# Patient Record
Sex: Male | Born: 1945
Health system: Southern US, Community
[De-identification: ages and names within clinical notes are randomized; demographics above are authoritative.]

## PROBLEM LIST (undated history)

## (undated) DIAGNOSIS — I10 Essential (primary) hypertension: Secondary | ICD-10-CM

## (undated) DIAGNOSIS — Z860100 Personal history of colon polyps, unspecified: Secondary | ICD-10-CM

## (undated) DIAGNOSIS — K760 Fatty (change of) liver, not elsewhere classified: Secondary | ICD-10-CM

## (undated) DIAGNOSIS — M1990 Unspecified osteoarthritis, unspecified site: Secondary | ICD-10-CM

## (undated) DIAGNOSIS — D472 Monoclonal gammopathy: Secondary | ICD-10-CM

## (undated) DIAGNOSIS — E785 Hyperlipidemia, unspecified: Secondary | ICD-10-CM

## (undated) DIAGNOSIS — R748 Abnormal levels of other serum enzymes: Secondary | ICD-10-CM

## (undated) DIAGNOSIS — B1920 Unspecified viral hepatitis C without hepatic coma: Secondary | ICD-10-CM

## (undated) DIAGNOSIS — E119 Type 2 diabetes mellitus without complications: Secondary | ICD-10-CM

## (undated) DIAGNOSIS — R0600 Dyspnea, unspecified: Secondary | ICD-10-CM

## (undated) DIAGNOSIS — D509 Iron deficiency anemia, unspecified: Secondary | ICD-10-CM

## (undated) DIAGNOSIS — C220 Liver cell carcinoma: Secondary | ICD-10-CM

## (undated) HISTORY — PX: POLYPECTOMY: SHX149

## (undated) HISTORY — PX: COLONOSCOPY: SHX174

---

## 2000-01-29 ENCOUNTER — Encounter: Admission: RE | Admit: 2000-01-29 | Discharge: 2000-04-28 | Payer: Self-pay | Admitting: Family Medicine

## 2001-06-08 ENCOUNTER — Encounter: Payer: Self-pay | Admitting: Family Medicine

## 2001-06-08 ENCOUNTER — Ambulatory Visit (HOSPITAL_COMMUNITY): Admission: RE | Admit: 2001-06-08 | Discharge: 2001-06-08 | Payer: Self-pay | Admitting: Family Medicine

## 2004-12-23 ENCOUNTER — Ambulatory Visit: Payer: Self-pay | Admitting: Internal Medicine

## 2006-04-05 ENCOUNTER — Encounter (INDEPENDENT_AMBULATORY_CARE_PROVIDER_SITE_OTHER): Payer: Self-pay | Admitting: Specialist

## 2006-04-05 ENCOUNTER — Ambulatory Visit (HOSPITAL_COMMUNITY): Admission: RE | Admit: 2006-04-05 | Discharge: 2006-04-05 | Payer: Self-pay | Admitting: *Deleted

## 2008-04-13 ENCOUNTER — Encounter: Admission: RE | Admit: 2008-04-13 | Discharge: 2008-04-13 | Payer: Self-pay | Admitting: Internal Medicine

## 2008-12-24 ENCOUNTER — Ambulatory Visit (HOSPITAL_COMMUNITY): Admission: RE | Admit: 2008-12-24 | Discharge: 2008-12-24 | Payer: Self-pay | Admitting: *Deleted

## 2010-10-29 ENCOUNTER — Other Ambulatory Visit: Payer: Self-pay | Admitting: Internal Medicine

## 2010-10-29 DIAGNOSIS — R7989 Other specified abnormal findings of blood chemistry: Secondary | ICD-10-CM

## 2010-11-10 ENCOUNTER — Ambulatory Visit
Admission: RE | Admit: 2010-11-10 | Discharge: 2010-11-10 | Disposition: A | Discharge status: Home / Self Care | Payer: BC Managed Care – PPO | Source: Ambulatory Visit | Attending: Internal Medicine | Admitting: Internal Medicine

## 2010-11-10 DIAGNOSIS — R7989 Other specified abnormal findings of blood chemistry: Secondary | ICD-10-CM

## 2010-11-13 ENCOUNTER — Other Ambulatory Visit: Payer: Self-pay | Admitting: Internal Medicine

## 2010-11-13 DIAGNOSIS — K769 Liver disease, unspecified: Secondary | ICD-10-CM

## 2010-11-26 ENCOUNTER — Ambulatory Visit
Admission: RE | Admit: 2010-11-26 | Discharge: 2010-11-26 | Disposition: A | Discharge status: Home / Self Care | Payer: BC Managed Care – PPO | Source: Ambulatory Visit | Attending: Internal Medicine | Admitting: Internal Medicine

## 2010-11-26 DIAGNOSIS — K769 Liver disease, unspecified: Secondary | ICD-10-CM

## 2010-12-02 LAB — GLUCOSE, CAPILLARY: Glucose-Capillary: 126 mg/dL — ABNORMAL HIGH (ref 70–99)

## 2011-01-06 NOTE — Op Note (Signed)
NAME:  ESTEVEN, SOLLARS NO.:  [id number]   MEDICAL RECORD NO.:  [id number]          PATIENT TYPE:  AMB   LOCATION:  ENDO                         FACILITY:  [facility]   PHYSICIAN:  Waverly Ferrari, M.D.    DATE OF BIRTH:  [birth year]   DATE OF PROCEDURE:  DATE OF DISCHARGE:                               OPERATIVE REPORT   PROCEDURE:  Colonoscopy.   INDICATIONS:  Colon cancer screening.   ANESTHESIA:  Fentanyl 100 mcg, Versed 10 mg.   PROCEDURE:  With the patient mildly sedated in the left lateral  decubitus position a rectal examination was performed which was limited,  but unremarkable to my limited exam.  Subsequently, the Pentax  videoscopic pediatric colonoscope was inserted in the rectum and passed  under direct vision to the cecum identified by ileocecal valve and  appendiceal orifice, both of which were photographed.  From this point  the colonoscope was slowly withdrawn taking circumferential views of the  colonic mucosa after suctioning some liquid material from the cecum and  we withdrew slowly, taking circumferential views of the remaining  colonic mucosa stopping in the rectum which appeared normal on direct  and retroflexed view.  The endoscope was straightened and withdrawn.  The patient's vital signs and pulse oximeter remained stable.  The  patient tolerated the procedure well without apparent complications.   FINDINGS:  Negative examination.   PLAN:  Consider repeat examination in 5-10 years.           ______________________________  Waverly Ferrari, M.D.     GMO/MEDQ  D:  12/24/2008  T:  12/24/2008  Job:  [id number]

## 2011-01-09 NOTE — Op Note (Signed)
NAME:  Aaron [name], Aaron [name] NO.:  [id number]   MEDICAL RECORD NO.:  [id number]          PATIENT TYPE:  AMB   LOCATION:  ENDO                         FACILITY:  [facility]   PHYSICIAN:  Waverly Ferrari, M.D.    DATE OF BIRTH:  03-21-1946   DATE OF PROCEDURE:  04/05/2006  DATE OF DISCHARGE:                                 OPERATIVE REPORT   PROCEDURE:  Colonoscopy.   INDICATIONS:  Colon polyps.   ANESTHESIA:  Demerol 100 mg, Versed 10 mg.   DESCRIPTION OF PROCEDURE:  With the patient mildly sedated in the left  lateral decubitus position a rectal examination was performed which was  unremarkable.  Subsequently the Olympus videoscopic colonoscope was inserted  into the rectum; passed under direct vision into cecum, identified by  ileocecal valve, and appendiceal orifice both of which were photographed.  From this point the colonoscope was slowly withdrawn taking circumferential  views of the colonic mucosa, stopping at the descending colon area where 2  polyps were seen, photographed, and removed using snare cautery technique at  a setting of 20/200 of blended current.  Both were retained for pathology;  and placed in specimen container #1.   We next stopped in the descending colon at approximately 40 cm from anal  verge, at which point, another polyp was seen; and it too was removed using  snare cautery technique, again, with the setting was 20/200 blended current;  and it was retained by suctioning it through the endoscope into a trap.  Once accomplished, the endoscope was then further withdrawn taking  circumferential views of the remaining colonic mucosa, stopping in the  rectum, which appeared normal on direct and retroflex view.  The endoscope  was straightened and withdrawn.  The patient's vital signs and pulse  oximeter remained stable.  The patient tolerated procedure well without  apparent complications.   FINDINGS:  Polyps as described above descending colon and  at 40 cm from the  anal verge.  Await biopsy report.  The patient will call me for results and  follow up with me as an outpatient.           ______________________________  Waverly Ferrari, M.D.     GMO/MEDQ  D:  04/05/2006  T:  04/05/2006  Job:  [id number]

## 2011-06-30 ENCOUNTER — Other Ambulatory Visit: Payer: Self-pay | Admitting: Internal Medicine

## 2011-06-30 DIAGNOSIS — R7401 Elevation of levels of liver transaminase levels: Secondary | ICD-10-CM

## 2011-07-02 ENCOUNTER — Ambulatory Visit
Admission: RE | Admit: 2011-07-02 | Discharge: 2011-07-02 | Disposition: A | Discharge status: Home / Self Care | Payer: 59 | Source: Ambulatory Visit | Attending: Internal Medicine | Admitting: Internal Medicine

## 2011-07-02 DIAGNOSIS — R7401 Elevation of levels of liver transaminase levels: Secondary | ICD-10-CM

## 2011-11-19 DIAGNOSIS — Z0000 Encounter for general adult medical examination without abnormal findings: Secondary | ICD-10-CM | POA: Diagnosis not present

## 2011-11-19 DIAGNOSIS — B1920 Unspecified viral hepatitis C without hepatic coma: Secondary | ICD-10-CM | POA: Diagnosis not present

## 2011-11-19 DIAGNOSIS — I10 Essential (primary) hypertension: Secondary | ICD-10-CM | POA: Diagnosis not present

## 2011-11-19 DIAGNOSIS — E119 Type 2 diabetes mellitus without complications: Secondary | ICD-10-CM | POA: Diagnosis not present

## 2011-11-19 DIAGNOSIS — E7800 Pure hypercholesterolemia, unspecified: Secondary | ICD-10-CM | POA: Diagnosis not present

## 2012-02-17 DIAGNOSIS — E119 Type 2 diabetes mellitus without complications: Secondary | ICD-10-CM | POA: Diagnosis not present

## 2012-02-22 DIAGNOSIS — E119 Type 2 diabetes mellitus without complications: Secondary | ICD-10-CM | POA: Diagnosis not present

## 2012-02-22 DIAGNOSIS — E7800 Pure hypercholesterolemia, unspecified: Secondary | ICD-10-CM | POA: Diagnosis not present

## 2012-02-22 DIAGNOSIS — I10 Essential (primary) hypertension: Secondary | ICD-10-CM | POA: Diagnosis not present

## 2012-02-22 DIAGNOSIS — Z23 Encounter for immunization: Secondary | ICD-10-CM | POA: Diagnosis not present

## 2012-08-22 DIAGNOSIS — Z125 Encounter for screening for malignant neoplasm of prostate: Secondary | ICD-10-CM | POA: Diagnosis not present

## 2012-08-22 DIAGNOSIS — E7800 Pure hypercholesterolemia, unspecified: Secondary | ICD-10-CM | POA: Diagnosis not present

## 2012-08-22 DIAGNOSIS — I10 Essential (primary) hypertension: Secondary | ICD-10-CM | POA: Diagnosis not present

## 2012-08-22 DIAGNOSIS — E559 Vitamin D deficiency, unspecified: Secondary | ICD-10-CM | POA: Diagnosis not present

## 2012-08-22 DIAGNOSIS — E119 Type 2 diabetes mellitus without complications: Secondary | ICD-10-CM | POA: Diagnosis not present

## 2012-08-25 ENCOUNTER — Other Ambulatory Visit: Payer: Self-pay | Admitting: Internal Medicine

## 2012-08-25 DIAGNOSIS — E785 Hyperlipidemia, unspecified: Secondary | ICD-10-CM | POA: Diagnosis not present

## 2012-08-25 DIAGNOSIS — IMO0001 Reserved for inherently not codable concepts without codable children: Secondary | ICD-10-CM | POA: Diagnosis not present

## 2012-08-25 DIAGNOSIS — I10 Essential (primary) hypertension: Secondary | ICD-10-CM | POA: Diagnosis not present

## 2012-08-25 DIAGNOSIS — E7800 Pure hypercholesterolemia, unspecified: Secondary | ICD-10-CM | POA: Diagnosis not present

## 2012-08-25 DIAGNOSIS — K769 Liver disease, unspecified: Secondary | ICD-10-CM

## 2012-08-29 ENCOUNTER — Ambulatory Visit
Admission: RE | Admit: 2012-08-29 | Discharge: 2012-08-29 | Disposition: A | Discharge status: Home / Self Care | Payer: 59 | Source: Ambulatory Visit | Attending: Internal Medicine | Admitting: Internal Medicine

## 2012-08-29 DIAGNOSIS — K769 Liver disease, unspecified: Secondary | ICD-10-CM

## 2012-08-29 DIAGNOSIS — K7689 Other specified diseases of liver: Secondary | ICD-10-CM | POA: Diagnosis not present

## 2012-11-23 DIAGNOSIS — I10 Essential (primary) hypertension: Secondary | ICD-10-CM | POA: Diagnosis not present

## 2012-11-23 DIAGNOSIS — E119 Type 2 diabetes mellitus without complications: Secondary | ICD-10-CM | POA: Diagnosis not present

## 2012-11-23 DIAGNOSIS — R7309 Other abnormal glucose: Secondary | ICD-10-CM | POA: Diagnosis not present

## 2013-04-20 DIAGNOSIS — E119 Type 2 diabetes mellitus without complications: Secondary | ICD-10-CM | POA: Diagnosis not present

## 2013-04-27 DIAGNOSIS — G5600 Carpal tunnel syndrome, unspecified upper limb: Secondary | ICD-10-CM | POA: Diagnosis not present

## 2013-04-27 DIAGNOSIS — E7800 Pure hypercholesterolemia, unspecified: Secondary | ICD-10-CM | POA: Diagnosis not present

## 2013-04-27 DIAGNOSIS — I10 Essential (primary) hypertension: Secondary | ICD-10-CM | POA: Diagnosis not present

## 2013-04-27 DIAGNOSIS — E119 Type 2 diabetes mellitus without complications: Secondary | ICD-10-CM | POA: Diagnosis not present

## 2013-07-27 DIAGNOSIS — Z125 Encounter for screening for malignant neoplasm of prostate: Secondary | ICD-10-CM | POA: Diagnosis not present

## 2013-07-27 DIAGNOSIS — E119 Type 2 diabetes mellitus without complications: Secondary | ICD-10-CM | POA: Diagnosis not present

## 2013-07-27 DIAGNOSIS — I10 Essential (primary) hypertension: Secondary | ICD-10-CM | POA: Diagnosis not present

## 2013-11-23 DIAGNOSIS — Z125 Encounter for screening for malignant neoplasm of prostate: Secondary | ICD-10-CM | POA: Diagnosis not present

## 2013-11-23 DIAGNOSIS — I10 Essential (primary) hypertension: Secondary | ICD-10-CM | POA: Diagnosis not present

## 2013-11-23 DIAGNOSIS — E119 Type 2 diabetes mellitus without complications: Secondary | ICD-10-CM | POA: Diagnosis not present

## 2013-11-27 ENCOUNTER — Other Ambulatory Visit: Payer: Self-pay | Admitting: Internal Medicine

## 2013-11-27 DIAGNOSIS — R945 Abnormal results of liver function studies: Principal | ICD-10-CM

## 2013-11-27 DIAGNOSIS — R7989 Other specified abnormal findings of blood chemistry: Secondary | ICD-10-CM

## 2013-11-27 DIAGNOSIS — R109 Unspecified abdominal pain: Secondary | ICD-10-CM

## 2013-11-29 DIAGNOSIS — M5380 Other specified dorsopathies, site unspecified: Secondary | ICD-10-CM | POA: Diagnosis not present

## 2013-11-29 DIAGNOSIS — IMO0001 Reserved for inherently not codable concepts without codable children: Secondary | ICD-10-CM | POA: Diagnosis not present

## 2013-11-29 DIAGNOSIS — R7402 Elevation of levels of lactic acid dehydrogenase (LDH): Secondary | ICD-10-CM | POA: Diagnosis not present

## 2013-11-29 DIAGNOSIS — E7800 Pure hypercholesterolemia, unspecified: Secondary | ICD-10-CM | POA: Diagnosis not present

## 2013-11-30 ENCOUNTER — Other Ambulatory Visit: Payer: Self-pay | Admitting: Internal Medicine

## 2013-11-30 ENCOUNTER — Ambulatory Visit
Admission: RE | Admit: 2013-11-30 | Discharge: 2013-11-30 | Disposition: A | Discharge status: Home / Self Care | Payer: 59 | Source: Ambulatory Visit | Attending: Internal Medicine | Admitting: Internal Medicine

## 2013-11-30 DIAGNOSIS — R160 Hepatomegaly, not elsewhere classified: Secondary | ICD-10-CM

## 2013-11-30 DIAGNOSIS — R7989 Other specified abnormal findings of blood chemistry: Secondary | ICD-10-CM

## 2013-11-30 DIAGNOSIS — R109 Unspecified abdominal pain: Secondary | ICD-10-CM

## 2013-11-30 DIAGNOSIS — R945 Abnormal results of liver function studies: Principal | ICD-10-CM

## 2013-11-30 DIAGNOSIS — K7689 Other specified diseases of liver: Secondary | ICD-10-CM | POA: Diagnosis not present

## 2013-12-08 ENCOUNTER — Other Ambulatory Visit: Payer: Medicare Other

## 2013-12-11 ENCOUNTER — Ambulatory Visit
Admission: RE | Admit: 2013-12-11 | Discharge: 2013-12-11 | Disposition: A | Discharge status: Home / Self Care | Payer: 59 | Source: Ambulatory Visit | Attending: Internal Medicine | Admitting: Internal Medicine

## 2013-12-11 DIAGNOSIS — R160 Hepatomegaly, not elsewhere classified: Secondary | ICD-10-CM

## 2013-12-21 DIAGNOSIS — R7989 Other specified abnormal findings of blood chemistry: Secondary | ICD-10-CM | POA: Diagnosis not present

## 2013-12-21 DIAGNOSIS — B1920 Unspecified viral hepatitis C without hepatic coma: Secondary | ICD-10-CM | POA: Diagnosis not present

## 2013-12-25 ENCOUNTER — Other Ambulatory Visit: Payer: Self-pay | Admitting: Internal Medicine

## 2013-12-25 DIAGNOSIS — R160 Hepatomegaly, not elsewhere classified: Secondary | ICD-10-CM

## 2013-12-28 ENCOUNTER — Ambulatory Visit
Admission: RE | Admit: 2013-12-28 | Discharge: 2013-12-28 | Disposition: A | Discharge status: Home / Self Care | Payer: Medicare Other | Source: Ambulatory Visit | Attending: Internal Medicine | Admitting: Internal Medicine

## 2013-12-28 DIAGNOSIS — R109 Unspecified abdominal pain: Secondary | ICD-10-CM | POA: Diagnosis not present

## 2013-12-28 DIAGNOSIS — R160 Hepatomegaly, not elsewhere classified: Secondary | ICD-10-CM

## 2013-12-28 MED ORDER — IOHEXOL 350 MG/ML SOLN
125.0000 mL | Freq: Once | INTRAVENOUS | Status: AC | PRN
  Administered 2013-12-28: 125 mL via INTRAVENOUS

## 2014-01-04 DIAGNOSIS — I10 Essential (primary) hypertension: Secondary | ICD-10-CM | POA: Diagnosis not present

## 2014-01-04 DIAGNOSIS — K769 Liver disease, unspecified: Secondary | ICD-10-CM | POA: Diagnosis not present

## 2014-01-04 DIAGNOSIS — B1920 Unspecified viral hepatitis C without hepatic coma: Secondary | ICD-10-CM | POA: Diagnosis not present

## 2014-01-08 ENCOUNTER — Other Ambulatory Visit: Payer: Self-pay | Admitting: Internal Medicine

## 2014-01-08 DIAGNOSIS — R160 Hepatomegaly, not elsewhere classified: Secondary | ICD-10-CM

## 2014-01-18 ENCOUNTER — Ambulatory Visit
Admission: RE | Admit: 2014-01-18 | Discharge: 2014-01-18 | Disposition: A | Discharge status: Home / Self Care | Payer: Medicare Other | Source: Ambulatory Visit | Attending: Internal Medicine | Admitting: Internal Medicine

## 2014-01-18 DIAGNOSIS — R160 Hepatomegaly, not elsewhere classified: Secondary | ICD-10-CM

## 2014-01-18 DIAGNOSIS — K7689 Other specified diseases of liver: Secondary | ICD-10-CM | POA: Diagnosis not present

## 2014-01-18 MED ORDER — GADOBENATE DIMEGLUMINE 529 MG/ML IV SOLN
20.0000 mL | Freq: Once | INTRAVENOUS | Status: AC | PRN
  Administered 2014-01-18: 20 mL via INTRAVENOUS

## 2014-01-23 ENCOUNTER — Other Ambulatory Visit (HOSPITAL_COMMUNITY): Payer: Self-pay | Admitting: Internal Medicine

## 2014-01-23 DIAGNOSIS — R160 Hepatomegaly, not elsewhere classified: Secondary | ICD-10-CM

## 2014-01-26 ENCOUNTER — Encounter (HOSPITAL_COMMUNITY): Payer: Self-pay | Admitting: Pharmacy Technician

## 2014-01-26 ENCOUNTER — Other Ambulatory Visit: Payer: Self-pay | Admitting: Radiology

## 2014-01-30 ENCOUNTER — Ambulatory Visit (HOSPITAL_COMMUNITY)
Admission: RE | Admit: 2014-01-30 | Discharge: 2014-01-30 | Disposition: A | Discharge status: Home / Self Care | Payer: 59 | Source: Ambulatory Visit | Attending: Internal Medicine | Admitting: Internal Medicine

## 2014-01-30 ENCOUNTER — Encounter (HOSPITAL_COMMUNITY): Payer: Self-pay

## 2014-01-30 DIAGNOSIS — K7689 Other specified diseases of liver: Secondary | ICD-10-CM | POA: Insufficient documentation

## 2014-01-30 DIAGNOSIS — E785 Hyperlipidemia, unspecified: Secondary | ICD-10-CM | POA: Insufficient documentation

## 2014-01-30 DIAGNOSIS — C228 Malignant neoplasm of liver, primary, unspecified as to type: Secondary | ICD-10-CM | POA: Diagnosis not present

## 2014-01-30 DIAGNOSIS — C220 Liver cell carcinoma: Secondary | ICD-10-CM

## 2014-01-30 DIAGNOSIS — R160 Hepatomegaly, not elsewhere classified: Secondary | ICD-10-CM

## 2014-01-30 HISTORY — DX: Essential (primary) hypertension: I10

## 2014-01-30 HISTORY — DX: Liver cell carcinoma: C22.0

## 2014-01-30 HISTORY — DX: Hyperlipidemia, unspecified: E78.5

## 2014-01-30 HISTORY — DX: Type 2 diabetes mellitus without complications: E11.9

## 2014-01-30 LAB — CBC
HCT: 44.5 % (ref 39.0–52.0)
Hemoglobin: 14.3 g/dL (ref 13.0–17.0)
MCH: 23.3 pg — ABNORMAL LOW (ref 26.0–34.0)
MCHC: 32.1 g/dL (ref 30.0–36.0)
MCV: 72.5 fL — ABNORMAL LOW (ref 78.0–100.0)
Platelets: 202 10*3/uL (ref 150–400)
RBC: 6.14 MIL/uL — ABNORMAL HIGH (ref 4.22–5.81)
RDW: 15.5 % (ref 11.5–15.5)
WBC: 4.4 10*3/uL (ref 4.0–10.5)

## 2014-01-30 LAB — GLUCOSE, CAPILLARY: Glucose-Capillary: 105 mg/dL — ABNORMAL HIGH (ref 70–99)

## 2014-01-30 LAB — PROTIME-INR
INR: 1.01 (ref 0.00–1.49)
Prothrombin Time: 13.1 seconds (ref 11.6–15.2)

## 2014-01-30 LAB — APTT: aPTT: 30 seconds (ref 24–37)

## 2014-01-30 MED ORDER — OXYCODONE HCL 5 MG PO TABS: 5.0000 mg | ORAL_TABLET | ORAL | Status: DC | PRN

## 2014-01-30 MED ORDER — SODIUM CHLORIDE 0.9 % IV SOLN
INTRAVENOUS | Status: DC
  Administered 2014-01-30: 13:00:00 via INTRAVENOUS

## 2014-01-30 MED ORDER — MIDAZOLAM HCL 2 MG/2ML IJ SOLN
INTRAMUSCULAR | Status: AC | PRN
  Administered 2014-01-30 (×2): 1 mg via INTRAVENOUS

## 2014-01-30 MED ORDER — MIDAZOLAM HCL 2 MG/2ML IJ SOLN
INTRAMUSCULAR | Status: AC
  Filled 2014-01-30: qty 4

## 2014-01-30 MED ORDER — FENTANYL CITRATE 0.05 MG/ML IJ SOLN
INTRAMUSCULAR | Status: DC
Start: 2014-01-30 — End: 2014-01-31
  Filled 2014-01-30: qty 4

## 2014-01-30 MED ORDER — FENTANYL CITRATE 0.05 MG/ML IJ SOLN
INTRAMUSCULAR | Status: AC | PRN
  Administered 2014-01-30 (×2): 50 ug via INTRAVENOUS

## 2014-01-30 NOTE — Discharge Instructions (Signed)
Liver Biopsy  Care After  These instructions give you information on caring for yourself after your procedure. Your doctor may also give you more specific instructions. Call your doctor if you have any problems or questions after your procedure.  HOME CARE  · Watch for bleeding at your biopsy site.  · No heavy lifting, pushing, or pulling for 48 hours (2 days).  · No exercise, jogging, or sex for 48 hours (2 days).  · Do not drive or use heavy machinery for 24 hours (1 day).  · Go back to your usual diet and medicines as told by your doctor.  · Do not take the bandage off until the next morning.  · Only take medicine as told by your doctor.  · Do not shower or bathe until the next day.  GET HELP RIGHT AWAY IF:  · You have shortness of breath or trouble breathing.  · You have pain or cramping in your belly (abdomen).  · You feel sick to your stomach (nauseous) or throw up (vomit).  · Bleeding does not stop from the place where the needle was put in. Press on the place that is bleeding until you are checked in the Emergency Room.  · Yellowish white fluid (pus) is coming from the place where the needle was put in.  · You have any unusual pain that will not stop.  · You have puffiness (swelling) or redness at the place where the needle was put in, or if the place is very sore or hot when you touch it.  · You have a fever of more than 102° F (38.9° C) for 2 or more days.  · You have black, smelly poops (bowel movements).  If you go to the Emergency Room, tell the nurse that you had a liver biopsy. Take this paper with you and show it to the nurse. Keep your follow-up appointment.  MAKE SURE YOU:  · Understand these instructions.  · Will watch your condition.  · Will get help right away if you are not doing well or get worse.  Document Released: 05/19/2008 Document Revised: 11/02/2011 Document Reviewed: 05/19/2008  Exit[employer]® Patient Information ©2014 [employer].

## 2014-01-30 NOTE — Procedures (Signed)
US guided core biopsy of right hepatic lesion.  No immediate complication.

## 2014-01-30 NOTE — H&P (Signed)
Aaron [name] is an 68 y.o. male.   Chief Complaint: Pt has long history of diabetes and known Hep C "few years"- untreated Follows with PMD and has blood work checked periodically Recent elevation of liver function tests [place] 11/2013 revealed liver lesion Referred to Dr Minna Antis: CT 12/28/13 and MRI 01/18/14 all show liver lesion Now scheduled for liver lesion biopsy  HPI: HTN; DM; Hep C; HLD  Past Medical History  Diagnosis Date  . Hypertension   . Diabetes mellitus without complication   . Hepatitis   . Hyperlipidemia     History reviewed. No pertinent past surgical history.  History reviewed. No pertinent family history. Social History:  reports that he quit smoking about 20 years ago. He does not have any smokeless tobacco history on file. His alcohol and drug histories are not on file.  Allergies: No Known Allergies   (Not in a hospital admission)  Results for orders placed during the hospital encounter of 01/30/14 (from the past 48 hour(s))  GLUCOSE, CAPILLARY     Status: Abnormal   Collection Time    01/30/14  1:12 PM      Result Value Ref Range   Glucose-Capillary 105 (*) 70 - 99 mg/dL   Comment 1 Notify RN     Comment 2 Documented in Chart     No results found.  Review of Systems  Constitutional: Negative for fever and weight loss.  Eyes: Negative for blurred vision.  Respiratory: Negative for cough and shortness of breath.   Gastrointestinal: Negative for nausea, vomiting and abdominal pain.  Neurological: Negative for dizziness and weakness.  Psychiatric/Behavioral: Negative for substance abuse.    Blood pressure 156/80, temperature 98.8 F (37.1 C), temperature source Oral, resp. rate 18, height 6' (1.829 m), weight 120.203 kg (265 lb), SpO2 100.00%. Physical Exam  Constitutional: He is oriented to person, place, and time. He appears well-nourished.  Cardiovascular: Normal rate, regular rhythm and normal heart sounds.   No murmur heard. Respiratory: Effort  normal and breath sounds normal. He has no wheezes.  GI: Soft. Bowel sounds are normal. There is no tenderness.  Musculoskeletal: Normal range of motion.  Neurological: He is alert and oriented to person, place, and time.  Skin: Skin is warm and dry.  Psychiatric: He has a normal mood and affect. His behavior is normal. Judgment and thought content normal.     Assessment/Plan Hx Hep C; DM Elevated LFTs [place] abnormal; CT and MRI reveal liver lesion Now scheduled for liver lesion biopsy Pt aware of procedure benefits and risks and agreeable to proceed Consent signed and in chart  Aaron [name] 01/30/2014, 1:36 PM

## 2014-02-12 DIAGNOSIS — B182 Chronic viral hepatitis C: Secondary | ICD-10-CM | POA: Diagnosis not present

## 2014-02-12 DIAGNOSIS — C228 Malignant neoplasm of liver, primary, unspecified as to type: Secondary | ICD-10-CM | POA: Diagnosis not present

## 2014-02-12 DIAGNOSIS — K7460 Unspecified cirrhosis of liver: Secondary | ICD-10-CM | POA: Diagnosis not present

## 2014-02-13 ENCOUNTER — Other Ambulatory Visit: Payer: Self-pay | Admitting: Nurse Practitioner

## 2014-02-13 DIAGNOSIS — C228 Malignant neoplasm of liver, primary, unspecified as to type: Secondary | ICD-10-CM

## 2014-02-15 ENCOUNTER — Ambulatory Visit
Admission: RE | Admit: 2014-02-15 | Discharge: 2014-02-15 | Disposition: A | Discharge status: Home / Self Care | Payer: 59 | Source: Ambulatory Visit | Attending: Nurse Practitioner | Admitting: Nurse Practitioner

## 2014-02-15 DIAGNOSIS — C228 Malignant neoplasm of liver, primary, unspecified as to type: Secondary | ICD-10-CM

## 2014-02-15 HISTORY — DX: Liver cell carcinoma: C22.0

## 2014-02-20 ENCOUNTER — Other Ambulatory Visit: Payer: Self-pay | Admitting: Diagnostic Radiology

## 2014-02-20 DIAGNOSIS — C228 Malignant neoplasm of liver, primary, unspecified as to type: Secondary | ICD-10-CM

## 2014-02-28 ENCOUNTER — Encounter (HOSPITAL_COMMUNITY): Payer: Self-pay | Admitting: Pharmacy Technician

## 2014-03-01 ENCOUNTER — Other Ambulatory Visit: Payer: Self-pay | Admitting: Diagnostic Radiology

## 2014-03-01 DIAGNOSIS — C220 Liver cell carcinoma: Secondary | ICD-10-CM

## 2014-03-01 MED ORDER — DOXORUBICIN HCL 50 MG IV SOLR: 50.0000 mg | Freq: Once | INTRAVENOUS | Status: AC

## 2014-03-02 ENCOUNTER — Other Ambulatory Visit: Payer: Self-pay | Admitting: Diagnostic Radiology

## 2014-03-04 ENCOUNTER — Other Ambulatory Visit: Payer: Self-pay | Admitting: Radiology

## 2014-03-05 ENCOUNTER — Other Ambulatory Visit: Payer: Self-pay | Admitting: Radiology

## 2014-03-06 ENCOUNTER — Ambulatory Visit (HOSPITAL_COMMUNITY)
Admission: RE | Admit: 2014-03-06 | Discharge: 2014-03-06 | Disposition: A | Discharge status: Home / Self Care | Payer: 59 | Source: Ambulatory Visit | Attending: Diagnostic Radiology | Admitting: Diagnostic Radiology

## 2014-03-06 ENCOUNTER — Other Ambulatory Visit: Payer: Self-pay | Admitting: Diagnostic Radiology

## 2014-03-06 ENCOUNTER — Encounter (HOSPITAL_COMMUNITY): Payer: Self-pay

## 2014-03-06 VITALS — BP 162/83 | HR 61 | Temp 97.9°F | Resp 13

## 2014-03-06 DIAGNOSIS — C228 Malignant neoplasm of liver, primary, unspecified as to type: Secondary | ICD-10-CM | POA: Diagnosis not present

## 2014-03-06 DIAGNOSIS — B1920 Unspecified viral hepatitis C without hepatic coma: Secondary | ICD-10-CM | POA: Diagnosis not present

## 2014-03-06 DIAGNOSIS — E119 Type 2 diabetes mellitus without complications: Secondary | ICD-10-CM | POA: Insufficient documentation

## 2014-03-06 DIAGNOSIS — Z87891 Personal history of nicotine dependence: Secondary | ICD-10-CM | POA: Insufficient documentation

## 2014-03-06 DIAGNOSIS — E785 Hyperlipidemia, unspecified: Secondary | ICD-10-CM | POA: Diagnosis not present

## 2014-03-06 DIAGNOSIS — Z538 Procedure and treatment not carried out for other reasons: Secondary | ICD-10-CM | POA: Insufficient documentation

## 2014-03-06 DIAGNOSIS — I10 Essential (primary) hypertension: Secondary | ICD-10-CM | POA: Insufficient documentation

## 2014-03-06 LAB — COMPREHENSIVE METABOLIC PANEL
ALT: 55 U/L — ABNORMAL HIGH (ref 0–53)
AST: 46 U/L — ABNORMAL HIGH (ref 0–37)
Albumin: 4 g/dL (ref 3.5–5.2)
Alkaline Phosphatase: 44 U/L (ref 39–117)
Anion gap: 13 (ref 5–15)
BUN: 17 mg/dL (ref 6–23)
CO2: 25 mEq/L (ref 19–32)
Calcium: 9.7 mg/dL (ref 8.4–10.5)
Chloride: 101 mEq/L (ref 96–112)
Creatinine, Ser: 1.07 mg/dL (ref 0.50–1.35)
GFR calc Af Amer: 81 mL/min — ABNORMAL LOW (ref >90)
GFR calc non Af Amer: 70 mL/min — ABNORMAL LOW (ref >90)
Glucose, Bld: 124 mg/dL — ABNORMAL HIGH (ref 70–99)
Potassium: 4.3 mEq/L (ref 3.7–5.3)
Sodium: 139 mEq/L (ref 137–147)
Total Bilirubin: 0.5 mg/dL (ref 0.3–1.2)
Total Protein: 8.2 g/dL (ref 6.0–8.3)

## 2014-03-06 LAB — CBC WITH DIFFERENTIAL/PLATELET
Basophils Absolute: 0 10*3/uL (ref 0.0–0.1)
Basophils Relative: 0 % (ref 0–1)
Eosinophils Absolute: 0.2 10*3/uL (ref 0.0–0.7)
Eosinophils Relative: 3 % (ref 0–5)
HCT: 42.2 % (ref 39.0–52.0)
Hemoglobin: 13.5 g/dL (ref 13.0–17.0)
Lymphocytes Relative: 37 % (ref 12–46)
Lymphs Abs: 2.1 10*3/uL (ref 0.7–4.0)
MCH: 22.6 pg — ABNORMAL LOW (ref 26.0–34.0)
MCHC: 32 g/dL (ref 30.0–36.0)
MCV: 70.7 fL — ABNORMAL LOW (ref 78.0–100.0)
Monocytes Absolute: 0.5 10*3/uL (ref 0.1–1.0)
Monocytes Relative: 9 % (ref 3–12)
Neutro Abs: 2.9 10*3/uL (ref 1.7–7.7)
Neutrophils Relative %: 51 % (ref 43–77)
Platelets: 212 10*3/uL (ref 150–400)
RBC: 5.97 MIL/uL — ABNORMAL HIGH (ref 4.22–5.81)
RDW: 14.8 % (ref 11.5–15.5)
WBC: 5.7 10*3/uL (ref 4.0–10.5)

## 2014-03-06 LAB — PROTIME-INR
INR: 0.96 (ref 0.00–1.49)
Prothrombin Time: 12.8 seconds (ref 11.6–15.2)

## 2014-03-06 LAB — GLUCOSE, CAPILLARY: Glucose-Capillary: 124 mg/dL — ABNORMAL HIGH (ref 70–99)

## 2014-03-06 LAB — APTT: aPTT: 30 seconds (ref 24–37)

## 2014-03-06 MED ORDER — FENTANYL CITRATE 0.05 MG/ML IJ SOLN
INTRAMUSCULAR | Status: AC
  Filled 2014-03-06: qty 8

## 2014-03-06 MED ORDER — SODIUM CHLORIDE 0.9 % IV SOLN
INTRAVENOUS | Status: DC
  Administered 2014-03-06: 08:00:00 via INTRAVENOUS

## 2014-03-06 MED ORDER — ONDANSETRON HCL 4 MG/2ML IJ SOLN
INTRAMUSCULAR | Status: AC
  Filled 2014-03-06: qty 2

## 2014-03-06 MED ORDER — DEXAMETHASONE SODIUM PHOSPHATE 10 MG/ML IJ SOLN
INTRAMUSCULAR | Status: AC
  Filled 2014-03-06: qty 1

## 2014-03-06 MED ORDER — PIPERACILLIN-TAZOBACTAM 3.375 G IVPB
3.3750 g | Freq: Once | INTRAVENOUS | Status: AC
  Administered 2014-03-06: 3.375 g via INTRAVENOUS
  Filled 2014-03-06: qty 50

## 2014-03-06 MED ORDER — DEXAMETHASONE SODIUM PHOSPHATE 10 MG/ML IJ SOLN
10.0000 mg | Freq: Once | INTRAMUSCULAR | Status: AC
  Administered 2014-03-06: 10 mg via INTRAVENOUS

## 2014-03-06 MED ORDER — DOXORUBICIN HCL 50 MG IV SOLR
Freq: Once | INTRAVENOUS | Status: DC
Start: 2014-03-06 — End: 2014-03-07
  Filled 2014-03-06: qty 8.5

## 2014-03-06 MED ORDER — ONDANSETRON HCL 4 MG/2ML IJ SOLN
4.0000 mg | Freq: Once | INTRAMUSCULAR | Status: AC
  Administered 2014-03-06: 4 mg via INTRAVENOUS

## 2014-03-06 MED ORDER — MIDAZOLAM HCL 2 MG/2ML IJ SOLN
INTRAMUSCULAR | Status: AC
  Filled 2014-03-06: qty 8

## 2014-03-06 NOTE — Progress Notes (Signed)
Returned to [facility] from [facility]. Did not receive sedation. IV removed. Patient discharged ambulatory.

## 2014-03-06 NOTE — H&P (Signed)
Aaron [name] is an 68 y.o. male.   Chief Complaint: Pt with Hx Hep C for years- untreated Noted elevated liver functions in 11/2013 US revealed liver lesion Bx 01/2014 +hepatocellualr cancer Pt was consulted with Dr Anselm Pancoast regarding treatment for same Pt not surgical candidate secondary size and location of liver tumor Discussed with pt was Transarterial chemoembolization (TACE) and microwave ablation Was determined TACE was to be performed initially then re evaluation Possible microwave ablation at later date.  HPI: HTN; [place]; DM; Hep C; HLD  Past Medical History  Diagnosis Date  . Hypertension   . Diabetes mellitus without complication   . Hepatitis   . Hyperlipidemia   . Hepatocellular carcinoma 01/30/2014    Path  . Hyperlipidemia     History reviewed. No pertinent past surgical history.  History reviewed. No pertinent family history. Social History:  reports that he quit smoking about 20 years ago. He has never used smokeless tobacco. He reports that he does not use illicit drugs. His alcohol history is not on file.  Allergies: No Known Allergies   (Not in a hospital admission)  Results for orders placed during the hospital encounter of 03/06/14 (from the past 48 hour(s))  APTT     Status: None   Collection Time    03/06/14  8:00 AM      Result Value Ref Range   aPTT 30  24 - 37 seconds  CBC WITH DIFFERENTIAL     Status: Abnormal (Preliminary result)   Collection Time    03/06/14  8:00 AM      Result Value Ref Range   WBC 5.7  4.0 - 10.5 K/uL   RBC 5.97 (*) 4.22 - 5.81 MIL/uL   Hemoglobin 13.5  13.0 - 17.0 g/dL   HCT 42.2  39.0 - 52.0 %   MCV 70.7 (*) 78.0 - 100.0 fL   MCH 22.6 (*) 26.0 - 34.0 pg   MCHC 32.0  30.0 - 36.0 g/dL   RDW 14.8  11.5 - 15.5 %   Platelets 212  150 - 400 K/uL   Neutrophils Relative % PENDING  43 - 77 %   Neutro Abs PENDING  1.7 - 7.7 K/uL   Band Neutrophils PENDING  0 - 10 %   Lymphocytes Relative PENDING  12 - 46 %   Lymphs Abs PENDING   0.7 - 4.0 K/uL   Monocytes Relative PENDING  3 - 12 %   Monocytes Absolute PENDING  0.1 - 1.0 K/uL   Eosinophils Relative PENDING  0 - 5 %   Eosinophils Absolute PENDING  0.0 - 0.7 K/uL   Basophils Relative PENDING  0 - 1 %   Basophils Absolute PENDING  0.0 - 0.1 K/uL   WBC Morphology PENDING     RBC Morphology PENDING     Smear Review PENDING     nRBC PENDING  0 /100 WBC   Metamyelocytes Relative PENDING     Myelocytes PENDING     Promyelocytes Absolute PENDING     Blasts PENDING    COMPREHENSIVE METABOLIC PANEL     Status: Abnormal   Collection Time    03/06/14  8:00 AM      Result Value Ref Range   Sodium 139  137 - 147 mEq/L   Potassium 4.3  3.7 - 5.3 mEq/L   Chloride 101  96 - 112 mEq/L   CO2 25  19 - 32 mEq/L   Glucose, Bld 124 (*) 70 - 99 mg/dL  BUN 17  6 - 23 mg/dL   Creatinine, Ser 1.07  0.50 - 1.35 mg/dL   Calcium 9.7  8.4 - 10.5 mg/dL   Total Protein 8.2  6.0 - 8.3 g/dL   Albumin 4.0  3.5 - 5.2 g/dL   AST 46 (*) 0 - 37 U/L   ALT 55 (*) 0 - 53 U/L   Alkaline Phosphatase 44  39 - 117 U/L   Total Bilirubin 0.5  0.3 - 1.2 mg/dL   GFR calc non Af Amer 70 (*) >90 mL/min   GFR calc Af Amer 81 (*) >90 mL/min   Comment: (NOTE)     The eGFR has been calculated using the CKD EPI equation.     This calculation has not been validated in all clinical situations.     eGFR's persistently <90 mL/min signify possible Chronic Kidney     Disease.   Anion gap 13  5 - 15  PROTIME-INR     Status: None   Collection Time    03/06/14  8:00 AM      Result Value Ref Range   Prothrombin Time 12.8  11.6 - 15.2 seconds   INR 0.96  0.00 - 1.49   No results found.  Review of Systems  Constitutional: Negative for fever and weight loss.  Respiratory: Negative for shortness of breath.   Cardiovascular: Negative for chest pain.  Gastrointestinal: Positive for abdominal pain. Negative for nausea and vomiting.  Musculoskeletal: Positive for back pain.  Neurological: Negative for weakness  and headaches.  Psychiatric/Behavioral: Negative for substance abuse.    Blood pressure 174/78, pulse 64, temperature 97.9 F (36.6 C), temperature source Oral, resp. rate 18, SpO2 99.00%. Physical Exam  Constitutional: He is oriented to person, place, and time. He appears well-nourished.  Cardiovascular: Normal rate, regular rhythm and normal heart sounds.   No murmur heard. Respiratory: Effort normal and breath sounds normal. He has no wheezes.  GI: Soft. Bowel sounds are normal. There is no tenderness.  Musculoskeletal: Normal range of motion.  Neurological: He is alert and oriented to person, place, and time.  Skin: Skin is warm and dry.  Psychiatric: He has a normal mood and affect. His behavior is normal. Judgment and thought content normal.     Assessment/Plan +HCC Liver lesion Scheduled for transarterial chemoembolization today in IR Pt aware of procedure benefits and risks and agreeable to proceed Consent signed and in chart Pt understand he will likely be admitted overnight after procedure Plan for dc in am  TURPIN,PAMELA A 03/06/2014, 8:51 AM  The procedure was re-scheduled because the chemotherapy agents were not available.  Plan for chemoembolization on 03/13/14.

## 2014-03-06 NOTE — Sedation Documentation (Signed)
Chemo drug needed for procedure not available from pharmacy. Case cancelled. Rescheduled. MD at bedside.

## 2014-03-07 ENCOUNTER — Other Ambulatory Visit: Payer: Self-pay | Admitting: Diagnostic Radiology

## 2014-03-07 MED ORDER — DOXORUBICIN HCL 50 MG IV SOLR
75.0000 mg | Freq: Once | INTRAVENOUS | Status: AC
  Administered 2014-03-13: 75 mg via INTRA_ARTERIAL
  Filled 2014-03-07: qty 75

## 2014-03-08 ENCOUNTER — Other Ambulatory Visit: Payer: Self-pay | Admitting: Radiology

## 2014-03-13 ENCOUNTER — Other Ambulatory Visit: Payer: Self-pay | Admitting: Diagnostic Radiology

## 2014-03-13 ENCOUNTER — Ambulatory Visit (HOSPITAL_COMMUNITY)
Admission: RE | Admit: 2014-03-13 | Discharge: 2014-03-13 | Disposition: A | Discharge status: Home / Self Care | Payer: 59 | Source: Ambulatory Visit | Attending: Diagnostic Radiology | Admitting: Diagnostic Radiology

## 2014-03-13 ENCOUNTER — Observation Stay (HOSPITAL_COMMUNITY)
Admission: RE | Admit: 2014-03-13 | Discharge: 2014-03-14 | Disposition: A | Discharge status: Home / Self Care | Payer: 59 | Source: Ambulatory Visit | Attending: Diagnostic Radiology | Admitting: Diagnostic Radiology

## 2014-03-13 ENCOUNTER — Encounter (HOSPITAL_COMMUNITY): Payer: Self-pay

## 2014-03-13 VITALS — BP 139/72 | HR 70 | Temp 97.8°F | Resp 16 | Ht 72.0 in | Wt 261.2 lb

## 2014-03-13 DIAGNOSIS — C228 Malignant neoplasm of liver, primary, unspecified as to type: Secondary | ICD-10-CM

## 2014-03-13 DIAGNOSIS — Z8619 Personal history of other infectious and parasitic diseases: Secondary | ICD-10-CM | POA: Diagnosis not present

## 2014-03-13 DIAGNOSIS — E785 Hyperlipidemia, unspecified: Secondary | ICD-10-CM | POA: Insufficient documentation

## 2014-03-13 DIAGNOSIS — I10 Essential (primary) hypertension: Secondary | ICD-10-CM | POA: Diagnosis not present

## 2014-03-13 DIAGNOSIS — E119 Type 2 diabetes mellitus without complications: Secondary | ICD-10-CM | POA: Diagnosis not present

## 2014-03-13 DIAGNOSIS — C229 Malignant neoplasm of liver, not specified as primary or secondary: Principal | ICD-10-CM | POA: Insufficient documentation

## 2014-03-13 DIAGNOSIS — K759 Inflammatory liver disease, unspecified: Secondary | ICD-10-CM | POA: Insufficient documentation

## 2014-03-13 DIAGNOSIS — Z87891 Personal history of nicotine dependence: Secondary | ICD-10-CM | POA: Insufficient documentation

## 2014-03-13 DIAGNOSIS — C220 Liver cell carcinoma: Secondary | ICD-10-CM | POA: Diagnosis present

## 2014-03-13 LAB — CBC WITH DIFFERENTIAL/PLATELET
Basophils Absolute: 0.1 10*3/uL (ref 0.0–0.1)
Basophils Relative: 1 % (ref 0–1)
Eosinophils Absolute: 0.2 10*3/uL (ref 0.0–0.7)
Eosinophils Relative: 3 % (ref 0–5)
HCT: 40.5 % (ref 39.0–52.0)
Hemoglobin: 13.1 g/dL (ref 13.0–17.0)
Lymphocytes Relative: 32 % (ref 12–46)
Lymphs Abs: 1.9 10*3/uL (ref 0.7–4.0)
MCH: 22.7 pg — ABNORMAL LOW (ref 26.0–34.0)
MCHC: 32.3 g/dL (ref 30.0–36.0)
MCV: 70.1 fL — ABNORMAL LOW (ref 78.0–100.0)
Monocytes Absolute: 0.5 10*3/uL (ref 0.1–1.0)
Monocytes Relative: 9 % (ref 3–12)
Neutro Abs: 3.2 10*3/uL (ref 1.7–7.7)
Neutrophils Relative %: 55 % (ref 43–77)
Platelets: 229 10*3/uL (ref 150–400)
RBC: 5.78 MIL/uL (ref 4.22–5.81)
RDW: 14.8 % (ref 11.5–15.5)
WBC: 5.9 10*3/uL (ref 4.0–10.5)

## 2014-03-13 LAB — COMPREHENSIVE METABOLIC PANEL
ALT: 42 U/L (ref 0–53)
AST: 34 U/L (ref 0–37)
Albumin: 3.7 g/dL (ref 3.5–5.2)
Alkaline Phosphatase: 45 U/L (ref 39–117)
Anion gap: 12 (ref 5–15)
BUN: 16 mg/dL (ref 6–23)
CO2: 26 mEq/L (ref 19–32)
Calcium: 9.6 mg/dL (ref 8.4–10.5)
Chloride: 99 mEq/L (ref 96–112)
Creatinine, Ser: 1.12 mg/dL (ref 0.50–1.35)
GFR calc Af Amer: 77 mL/min — ABNORMAL LOW (ref >90)
GFR calc non Af Amer: 66 mL/min — ABNORMAL LOW (ref >90)
Glucose, Bld: 124 mg/dL — ABNORMAL HIGH (ref 70–99)
Potassium: 4.3 mEq/L (ref 3.7–5.3)
Sodium: 137 mEq/L (ref 137–147)
Total Bilirubin: 0.5 mg/dL (ref 0.3–1.2)
Total Protein: 7.6 g/dL (ref 6.0–8.3)

## 2014-03-13 LAB — PROTIME-INR
INR: 0.91 (ref 0.00–1.49)
Prothrombin Time: 12.3 seconds (ref 11.6–15.2)

## 2014-03-13 LAB — GLUCOSE, CAPILLARY: Glucose-Capillary: 110 mg/dL — ABNORMAL HIGH (ref 70–99)

## 2014-03-13 MED ORDER — DEXAMETHASONE SODIUM PHOSPHATE 10 MG/ML IJ SOLN
8.0000 mg | Freq: Once | INTRAMUSCULAR | Status: AC
  Administered 2014-03-13: 8 mg via INTRAVENOUS
  Filled 2014-03-13: qty 1

## 2014-03-13 MED ORDER — PIPERACILLIN-TAZOBACTAM 3.375 G IVPB
3.3750 g | Freq: Once | INTRAVENOUS | Status: AC
  Administered 2014-03-13: 3.375 g via INTRAVENOUS
  Filled 2014-03-13: qty 50

## 2014-03-13 MED ORDER — PROMETHAZINE HCL 25 MG PO TABS
25.0000 mg | ORAL_TABLET | Freq: Three times a day (TID) | ORAL | Status: DC | PRN
Start: 2014-03-13 — End: 2014-03-14

## 2014-03-13 MED ORDER — LISINOPRIL 20 MG PO TABS
20.0000 mg | ORAL_TABLET | Freq: Every morning | ORAL | Status: DC
  Administered 2014-03-14: 20 mg via ORAL
  Filled 2014-03-13: qty 1

## 2014-03-13 MED ORDER — ASPIRIN EC 81 MG PO TBEC
81.0000 mg | DELAYED_RELEASE_TABLET | Freq: Every day | ORAL | Status: DC
Start: 2014-03-13 — End: 2014-03-14
  Administered 2014-03-13 – 2014-03-14 (×2): 81 mg via ORAL
  Filled 2014-03-13 (×2): qty 1

## 2014-03-13 MED ORDER — OXYCODONE HCL 5 MG PO TABS: 5.0000 mg | ORAL_TABLET | ORAL | Status: DC | PRN

## 2014-03-13 MED ORDER — FENTANYL CITRATE 0.05 MG/ML IJ SOLN
INTRAMUSCULAR | Status: AC
  Filled 2014-03-13: qty 6

## 2014-03-13 MED ORDER — MIDAZOLAM HCL 2 MG/2ML IJ SOLN
INTRAMUSCULAR | Status: AC | PRN
  Administered 2014-03-13: 0.5 mg via INTRAVENOUS
  Administered 2014-03-13: 1 mg via INTRAVENOUS
  Administered 2014-03-13 (×3): 0.5 mg via INTRAVENOUS
  Administered 2014-03-13: 1 mg via INTRAVENOUS
  Administered 2014-03-13 (×2): 0.5 mg via INTRAVENOUS

## 2014-03-13 MED ORDER — SODIUM CHLORIDE 0.9 % IJ SOLN: 3.0000 mL | INTRAMUSCULAR | Status: DC | PRN

## 2014-03-13 MED ORDER — MIDAZOLAM HCL 2 MG/2ML IJ SOLN
INTRAMUSCULAR | Status: AC
  Filled 2014-03-13: qty 6

## 2014-03-13 MED ORDER — METFORMIN HCL 500 MG PO TABS: 1000.0000 mg | ORAL_TABLET | Freq: Every day | ORAL | Status: DC

## 2014-03-13 MED ORDER — LINAGLIPTIN 5 MG PO TABS: 5.0000 mg | ORAL_TABLET | Freq: Every day | ORAL | Status: DC

## 2014-03-13 MED ORDER — SIMVASTATIN 40 MG PO TABS
40.0000 mg | ORAL_TABLET | Freq: Every day | ORAL | Status: DC
  Administered 2014-03-13 – 2014-03-14 (×2): 40 mg via ORAL
  Filled 2014-03-13 (×2): qty 1

## 2014-03-13 MED ORDER — SODIUM CHLORIDE 0.9 % IJ SOLN: 3.0000 mL | Freq: Two times a day (BID) | INTRAMUSCULAR | Status: DC

## 2014-03-13 MED ORDER — IOHEXOL 300 MG/ML  SOLN: 80.0000 mL | Freq: Once | INTRAMUSCULAR | Status: AC | PRN

## 2014-03-13 MED ORDER — INSULIN GLARGINE 100 UNIT/ML ~~LOC~~ SOLN
31.0000 [IU] | Freq: Every morning | SUBCUTANEOUS | Status: DC
  Administered 2014-03-14: 31 [IU] via SUBCUTANEOUS
  Filled 2014-03-13: qty 0.31

## 2014-03-13 MED ORDER — SODIUM CHLORIDE 0.9 % IV SOLN
INTRAVENOUS | Status: DC
  Administered 2014-03-13 (×2): via INTRAVENOUS

## 2014-03-13 MED ORDER — LIDOCAINE HCL 1 % IJ SOLN
INTRAMUSCULAR | Status: AC
  Filled 2014-03-13: qty 20

## 2014-03-13 MED ORDER — PROMETHAZINE HCL 25 MG RE SUPP: 25.0000 mg | Freq: Three times a day (TID) | RECTAL | Status: DC | PRN

## 2014-03-13 MED ORDER — ONDANSETRON HCL 4 MG/2ML IJ SOLN
4.0000 mg | Freq: Four times a day (QID) | INTRAMUSCULAR | Status: DC | PRN
  Administered 2014-03-13: 4 mg via INTRAVENOUS
  Filled 2014-03-13: qty 2

## 2014-03-13 MED ORDER — IOHEXOL 300 MG/ML  SOLN
INTRAMUSCULAR | Status: AC | PRN
  Administered 2014-03-13: 1 mL

## 2014-03-13 MED ORDER — FENTANYL CITRATE 0.05 MG/ML IJ SOLN
INTRAMUSCULAR | Status: AC | PRN
  Administered 2014-03-13: 50 ug via INTRAVENOUS
  Administered 2014-03-13 (×2): 25 ug via INTRAVENOUS

## 2014-03-13 MED ORDER — SODIUM CHLORIDE 0.9 % IV SOLN: 250.0000 mL | INTRAVENOUS | Status: DC | PRN

## 2014-03-13 MED ORDER — VITAMIN D3 25 MCG (1000 UNIT) PO TABS
1000.0000 [IU] | ORAL_TABLET | Freq: Every day | ORAL | Status: DC
  Administered 2014-03-13 – 2014-03-14 (×2): 1000 [IU] via ORAL
  Filled 2014-03-13 (×2): qty 1

## 2014-03-13 NOTE — Sedation Documentation (Signed)
5Fr Sheath removed from R femoral artery by Dr. Anselm Pancoast.  Hemostasis achieved using Exoseal device.  Groin level 0, 3+RDP.

## 2014-03-13 NOTE — Sedation Documentation (Signed)
Gauze/tegaderm dressing applied to R fem art puncture.  CDI, Level 0, 3+R DP.

## 2014-03-13 NOTE — H&P (Signed)
Aaron [name] is an 68 y.o. male.   Chief Complaint: liver cancer HPI: Patient with history of hepatitis C and recently diagnosed [place] (right hepatic lobe) presents today for hepatic arteriography/chemoembolization (TACE).  Past Medical History  Diagnosis Date  . Hypertension   . Diabetes mellitus without complication   . Hepatitis   . Hyperlipidemia   . Hepatocellular carcinoma 01/30/2014    Path  . Hyperlipidemia     History reviewed. No pertinent past surgical history.  History reviewed. No pertinent family history. Social History:  reports that he quit smoking about 20 years ago. He has never used smokeless tobacco. He reports that he does not use illicit drugs. His alcohol history is not on file.  Allergies: No Known Allergies  Current outpatient prescriptions:aspirin EC 81 MG tablet, Take 81 mg by mouth daily., Disp: , Rfl: ;  cholecalciferol (VITAMIN D) 1000 UNITS tablet, Take 1,000 Units by mouth daily., Disp: , Rfl: ;  Insulin Glargine (LANTUS SOLOSTAR) 100 UNIT/ML Solostar Pen, Inject 31 Units into the skin every morning. , Disp: , Rfl: ;  lisinopril (PRINIVIL,ZESTRIL) 20 MG tablet, Take 20 mg by mouth every morning. , Disp: , Rfl:  Multiple Vitamins-Minerals (MULTIVITAMIN WITH MINERALS) tablet, Take 1 tablet by mouth daily., Disp: , Rfl: ;  simvastatin (ZOCOR) 40 MG tablet, Take 40 mg by mouth daily., Disp: , Rfl: ;  sitaGLIPtin-metformin (JANUMET) 50-1000 MG per tablet, Take 1 tablet by mouth daily., Disp: , Rfl:  Current facility-administered medications:0.9 %  sodium chloride infusion, , Intravenous, Continuous, Hedy Jacob, PA-C, Last Rate: 75 mL/hr at 03/13/14 0815;  dexamethasone (DECADRON) injection 8 mg, 8 mg, Intravenous, Once, Koreen D Morgan, PA-C;  DOXOrubicin (ADRIAMYCIN) chemo injection 75 mg, 75 mg, Intra-arterial, Once, Carylon Perches, MD;  ondansetron [facility]) injection 4 mg, 4 mg, Intravenous, Q6H PRN, Hedy Jacob, PA-C piperacillin-tazobactam (ZOSYN) IVPB  3.375 g, 3.375 g, Intravenous, Once, Hedy Jacob, PA-C   Results for orders placed during the hospital encounter of 03/13/14 (from the past 48 hour(s))  CBC WITH DIFFERENTIAL     Status: Abnormal   Collection Time    03/13/14  8:00 AM      Result Value Ref Range   WBC 5.9  4.0 - 10.5 K/uL   RBC 5.78  4.22 - 5.81 MIL/uL   Hemoglobin 13.1  13.0 - 17.0 g/dL   HCT 40.5  39.0 - 52.0 %   MCV 70.1 (*) 78.0 - 100.0 fL   MCH 22.7 (*) 26.0 - 34.0 pg   MCHC 32.3  30.0 - 36.0 g/dL   RDW 14.8  11.5 - 15.5 %   Platelets 229  150 - 400 K/uL   Neutrophils Relative % 55  43 - 77 %   Lymphocytes Relative 32  12 - 46 %   Monocytes Relative 9  3 - 12 %   Eosinophils Relative 3  0 - 5 %   Basophils Relative 1  0 - 1 %   Neutro Abs 3.2  1.7 - 7.7 K/uL   Lymphs Abs 1.9  0.7 - 4.0 K/uL   Monocytes Absolute 0.5  0.1 - 1.0 K/uL   Eosinophils Absolute 0.2  0.0 - 0.7 K/uL   Basophils Absolute 0.1  0.0 - 0.1 K/uL   Smear Review MORPHOLOGY UNREMARKABLE    COMPREHENSIVE METABOLIC PANEL     Status: Abnormal   Collection Time    03/13/14  8:00 AM      Result Value Ref Range  Sodium 137  137 - 147 mEq/L   Potassium 4.3  3.7 - 5.3 mEq/L   Chloride 99  96 - 112 mEq/L   CO2 26  19 - 32 mEq/L   Glucose, Bld 124 (*) 70 - 99 mg/dL   BUN 16  6 - 23 mg/dL   Creatinine, Ser 1.12  0.50 - 1.35 mg/dL   Calcium 9.6  8.4 - 10.5 mg/dL   Total Protein 7.6  6.0 - 8.3 g/dL   Albumin 3.7  3.5 - 5.2 g/dL   AST 34  0 - 37 U/L   ALT 42  0 - 53 U/L   Alkaline Phosphatase 45  39 - 117 U/L   Total Bilirubin 0.5  0.3 - 1.2 mg/dL   GFR calc non Af Amer 66 (*) >90 mL/min   GFR calc Af Amer 77 (*) >90 mL/min   Comment: (NOTE)     The eGFR has been calculated using the CKD EPI equation.     This calculation has not been validated in all clinical situations.     eGFR's persistently <90 mL/min signify possible Chronic Kidney     Disease.   Anion gap 12  5 - 15  PROTIME-INR     Status: None   Collection Time    03/13/14   8:00 AM      Result Value Ref Range   Prothrombin Time 12.3  11.6 - 15.2 seconds   INR 0.91  0.00 - 1.49   No results found.  Review of Systems  Constitutional: Negative for fever and chills.  Respiratory: Negative for hemoptysis and shortness of breath.        Occ dry cough  Cardiovascular: Negative for chest pain.  Gastrointestinal: Negative for nausea, vomiting and abdominal pain.  Genitourinary: Negative for dysuria and hematuria.  Musculoskeletal: Positive for back pain.  Neurological: Negative for headaches.  Endo/Heme/Allergies: Does not bruise/bleed easily.    Blood pressure 144/83, pulse 67, temperature 98.7 F (37.1 C), temperature source Oral, resp. rate 18, height 6' (1.829 m), weight 268 lb (121.564 kg), SpO2 96.00%. Physical Exam  Constitutional: He is oriented to person, place, and time. He appears well-developed and well-nourished.  Cardiovascular: Normal rate and regular rhythm.   Respiratory: Effort normal and breath sounds normal.  GI: Soft. Bowel sounds are normal. There is no tenderness.  obese  Musculoskeletal: Normal range of motion.  Trace bilat LE edema  Neurological: He is alert and oriented to person, place, and time.     Assessment/Plan Patient with history of hepatitis C and recently diagnosed [place] (right hepatic lobe) presents today for hepatic arteriography/chemoembolization (TACE). Details/risks of procedure d/w pt/wife with their understanding and consent.  Keliyah Spillman,D [facility] 03/13/2014, 8:43 AM

## 2014-03-13 NOTE — Progress Notes (Signed)
Day of Surgery  Subjective: Pt without new c/o; denies abd pain,N/V; currently eating  Objective: Vital signs in last 24 hours: Temp:  [97.8 F (36.6 C)-98.7 F (37.1 C)] 98.1 F (36.7 C) (07/21 1550) Pulse Rate:  [53-89] 89 (07/21 1550) Resp:  [11-20] 18 (07/21 1550) BP: (109-151)/(62-83) 131/78 mmHg (07/21 1550) SpO2:  [13 %-100 %] 99 % (07/21 1550) Weight:  [261 lb 3.9 oz (118.5 kg)-268 lb (121.564 kg)] 261 lb 3.9 oz (118.5 kg) (07/21 1350)    Intake/Output from previous day:   Intake/Output this shift: Total I/O In: 300 [P.O.:300] Out: 850 [Urine:850]  Awake/alert; abd- soft,+BS,NT; right CFA puncture site clean and dry,soft,NT, no hematoma; intact distal pulses  Lab Results:   Recent Labs  03/13/14 0800  WBC 5.9  HGB 13.1  HCT 40.5  PLT 229   BMET  Recent Labs  03/13/14 0800  NA 137  K 4.3  CL 99  CO2 26  GLUCOSE 124*  BUN 16  CREATININE 1.12  CALCIUM 9.6   PT/INR  Recent Labs  03/13/14 0800  LABPROT 12.3  INR 0.91   ABG No results found for this basename: PHART, PCO2, PO2, HCO3,  in the last 72 hours  Studies/Results: No results found.  Anti-infectives: Anti-infectives   Start     Dose/Rate Route Frequency Ordered Stop   03/13/14 0745  piperacillin-tazobactam (ZOSYN) IVPB 3.375 g     3.375 g 12.5 mL/hr over 240 Minutes Intravenous  Once 03/13/14 0734 03/13/14 1502      Assessment/Plan: s/p DEB-TACE right hepatic lobe HCC; for overnight obs; check am labs; f/u with Dr. Anselm Pancoast in [facility] in 4 weeks with MRI liver /CMP  LOS: 0 days    Jhace Fennell,D [facility] 03/13/2014

## 2014-03-13 NOTE — Procedures (Signed)
Post-Procedure Note  Pre-operative Diagnosis: Hepatocellular carcinoma       Post-operative Diagnosis: Hepatocellular carcinoma   Indications: HCC and poor operative candidate.  Procedure Details:   SMA and celiac arteriography performed. Identified hypervascular lesion in right hepatic lobe c/w HCC.  Performed DEB-TACE with 75 mg doxorubicin and 100-300 LC beads.  Right groin sheath removed with Exoseal closure device.  See Radiology report for additional details.  Findings: Hypervascular lesion in right hepatic lobe.  Dose administered through two main feeding branches.    Complications: None     Condition: stable  Plan: Observe overnight for symptomatic care.

## 2014-03-14 ENCOUNTER — Other Ambulatory Visit: Payer: Self-pay | Admitting: Radiology

## 2014-03-14 DIAGNOSIS — C229 Malignant neoplasm of liver, not specified as primary or secondary: Secondary | ICD-10-CM | POA: Diagnosis not present

## 2014-03-14 DIAGNOSIS — C228 Malignant neoplasm of liver, primary, unspecified as to type: Secondary | ICD-10-CM | POA: Diagnosis not present

## 2014-03-14 DIAGNOSIS — C220 Liver cell carcinoma: Secondary | ICD-10-CM

## 2014-03-14 LAB — COMPREHENSIVE METABOLIC PANEL
ALT: 40 U/L (ref 0–53)
AST: 39 U/L — ABNORMAL HIGH (ref 0–37)
Albumin: 3.5 g/dL (ref 3.5–5.2)
Alkaline Phosphatase: 45 U/L (ref 39–117)
Anion gap: 14 (ref 5–15)
BUN: 22 mg/dL (ref 6–23)
CO2: 24 mEq/L (ref 19–32)
Calcium: 9.1 mg/dL (ref 8.4–10.5)
Chloride: 98 mEq/L (ref 96–112)
Creatinine, Ser: 1.06 mg/dL (ref 0.50–1.35)
GFR calc Af Amer: 82 mL/min — ABNORMAL LOW (ref >90)
GFR calc non Af Amer: 71 mL/min — ABNORMAL LOW (ref >90)
Glucose, Bld: 170 mg/dL — ABNORMAL HIGH (ref 70–99)
Potassium: 5.2 mEq/L (ref 3.7–5.3)
Sodium: 136 mEq/L — ABNORMAL LOW (ref 137–147)
Total Bilirubin: 0.5 mg/dL (ref 0.3–1.2)
Total Protein: 7.8 g/dL (ref 6.0–8.3)

## 2014-03-14 LAB — CBC
HCT: 41.8 % (ref 39.0–52.0)
Hemoglobin: 13.4 g/dL (ref 13.0–17.0)
MCH: 22.9 pg — ABNORMAL LOW (ref 26.0–34.0)
MCHC: 32.1 g/dL (ref 30.0–36.0)
MCV: 71.3 fL — ABNORMAL LOW (ref 78.0–100.0)
Platelets: 251 10*3/uL (ref 150–400)
RBC: 5.86 MIL/uL — ABNORMAL HIGH (ref 4.22–5.81)
RDW: 14.7 % (ref 11.5–15.5)
WBC: 9.5 10*3/uL (ref 4.0–10.5)

## 2014-03-14 LAB — GLUCOSE, CAPILLARY: Glucose-Capillary: 159 mg/dL — ABNORMAL HIGH (ref 70–99)

## 2014-03-14 NOTE — Progress Notes (Signed)
Patient was stable at time of discharge. Reviewed discharge education with patient and wife. They verbalized understanding and had no further questions.

## 2014-03-14 NOTE — Discharge Summary (Signed)
Agree.  Patient without symptoms.  OK to discharge.  Follow up with Dr. Anselm Pancoast in clinic in 4 weeks.

## 2014-03-14 NOTE — Discharge Summary (Signed)
Physician Discharge Summary  Patient ID: Aaron [name] MRN: [id number] DOB/AGE: 11-30-1945 68 y.o.  Admit date: 03/13/2014 Discharge date: 03/14/2014  Admission Diagnoses: Active Problems:   Hepatocellular carcinoma  Discharge Diagnoses:  Active Problems:   Hepatocellular carcinoma    Procedures: Transarterial chemoembolization of [place] 7/21 by Dr. Anselm Pancoast  Discharged Condition: good  Hospital Course: HPI: Patient with history of hepatitis C and recently diagnosed [place] (right hepatic lobe) presents today for hepatic arteriography/chemoembolization (TACE). Summary: Pt brought to IR suite on 7/21 and underwent successful TACE procedure of right hepatic lobe HCC. He was then admitted to the floor in stable condition for observation. The pt did very well. He denies any significant pain in his abdomen or groin. Has tolerated regular diet. Has been voiding well. Has been OOB/ambulating in halls. POD#1 labs are stable from pre-op. CBC    Component Value Date/Time   WBC 9.5 03/14/2014 0448   RBC 5.86* 03/14/2014 0448   HGB 13.4 03/14/2014 0448   HCT 41.8 03/14/2014 0448   PLT 251 03/14/2014 0448   MCV 71.3* 03/14/2014 0448   MCH 22.9* 03/14/2014 0448   MCHC 32.1 03/14/2014 0448   RDW 14.7 03/14/2014 0448   LYMPHSABS 1.9 03/13/2014 0800   MONOABS 0.5 03/13/2014 0800   EOSABS 0.2 03/13/2014 0800   BASOSABS 0.1 03/13/2014 0800    BMET    Component Value Date/Time   NA 136* 03/14/2014 0448   K 5.2 03/14/2014 0448   CL 98 03/14/2014 0448   CO2 24 03/14/2014 0448   GLUCOSE 170* 03/14/2014 0448   BUN 22 03/14/2014 0448   CREATININE 1.06 03/14/2014 0448   CALCIUM 9.1 03/14/2014 0448   GFRNONAA 71* 03/14/2014 0448   GFRAA 82* 03/14/2014 0448    Pt is determined to be stable for discharge. All instructions, restrictions, home medications, and follow up plans were reviewed in detail.   Consults: None   Discharge Exam: Blood pressure 139/72, pulse 70, temperature 97.8 F (36.6 C), temperature source  Oral, resp. rate 16, height 6' (1.829 m), weight 261 lb 3.9 oz (118.5 kg), SpO2 99.00%. Lungs: CTA without w/r/r Heart: Regular Abdomen: soft, NT, ND Ext: (R)groin site clean, dry, soft, NT, no hematoma   Disposition: Home  Discharge Instructions   Call MD for:  difficulty breathing, headache or visual disturbances    Complete by:  As directed      Call MD for:  persistant nausea and vomiting    Complete by:  As directed      Call MD for:  redness, tenderness, or signs of infection (pain, swelling, redness, odor or green/yellow discharge around incision site)    Complete by:  As directed      Call MD for:  severe uncontrolled pain    Complete by:  As directed      Call MD for:  temperature >100.4    Complete by:  As directed      Diet - low sodium heart healthy    Complete by:  As directed      Driving Restrictions    Complete by:  As directed   Avoid driving for 2-3 days     Increase activity slowly    Complete by:  As directed      May shower / Bathe    Complete by:  As directed      May walk up steps    Complete by:  As directed      No dressing needed  Complete by:  As directed             Medication List         aspirin EC 81 MG tablet  Take 81 mg by mouth daily.     cholecalciferol 1000 UNITS tablet  Commonly known as:  VITAMIN D  Take 1,000 Units by mouth daily.     LANTUS SOLOSTAR 100 UNIT/ML Solostar Pen  Generic drug:  Insulin Glargine  Inject 31 Units into the skin every morning.     lisinopril 20 MG tablet  Commonly known as:  PRINIVIL,ZESTRIL  Take 20 mg by mouth every morning.     multivitamin with minerals tablet  Take 1 tablet by mouth daily.     simvastatin 40 MG tablet  Commonly known as:  ZOCOR  Take 40 mg by mouth daily.     sitaGLIPtin-metformin 50-1000 MG per tablet  Commonly known as:  JANUMET  Take 1 tablet by mouth daily.           Follow-up Information   Follow up with Carylon Perches, MD. Schedule an appointment as  soon as possible for a visit in 4 weeks. (Office will call you with follow up appointments)    Specialty:  Interventional Radiology   Contact information:   [address] [place] [address] [phone]       Signed: Ascencion Dike PA-C 03/14/2014, 9:10 AM

## 2014-03-14 NOTE — Discharge Instructions (Addendum)
Chemoembolization, Care After Refer to this sheet in the next few weeks. These instructions provide you with information on caring for yourself after your procedure. Your health care provider may also give you more specific instructions. Your treatment has been planned according to current medical practices, but problems sometimes occur. Call your health care provider if you have any problems or questions after your procedure. WHAT TO EXPECT AFTER THE PROCEDURE  After your procedure, it is typical to have the following:  You might have a slight fever for 1-2 weeks after the procedure. If it gets worse, let your health care provider know.  You might feel tired and not hungry. This is normal. These feelings should go away in about 1 week. HOME CARE INSTRUCTIONS  Take any medicine your health care provider prescribed for pain, nausea, or fever. Follow the directions carefully.  Ask your health care provider whether you can take over-the-counter medicines for pain or fever.  If you were given a small breathing device (incentive spirometer), be sure to use it. It helps keep your lungs clear while you are recovering. You will not need this after your activity level is back to normal.  Do not get the puncture site wet for the first few days after surgery or until your health care provider says it is okay.  You should be able to resume your normal routine in about 1 week.  During the first month after your procedure, you will probably need to go back to your health care provider for some simple tests. Scans and blood tests will help determine whether the procedure worked. SEEK MEDICAL CARE IF:  Blood or fluid leaks from the wound, or the wound becomes red or swollen.  You become nauseous or throw up for more than 2 days after surgery.  Your pain or fever becomes worse than it was when you left the hospital.  You cannot drink clear liquids such as water or diluted juice or tea 24 hours after your  procedure.  You develop a rash. SEEK IMMEDIATE MEDICAL CARE IF:  You have a fever that gets worse or does not go away after 1 week.  You develop pain, swelling, or discoloration in your legs.  Your legs become pale, cold, or blue.  You develop shortness of breath, feel faint, or pass out.  You have chest pain.  You have weakness or difficulty moving your arms or legs.  You have changes in your speech or vision. Document Released: 04/08/2011 Document Revised: 05/31/2013 Document Reviewed: 04/17/2013 [facility] Patient Information 2015 [place], [place]. This information is not intended to replace advice given to you by your health care provider. Make sure you discuss any questions you have with your health care provider.

## 2014-03-22 ENCOUNTER — Other Ambulatory Visit (HOSPITAL_COMMUNITY): Payer: Self-pay | Admitting: Diagnostic Radiology

## 2014-03-22 ENCOUNTER — Other Ambulatory Visit: Payer: Self-pay | Admitting: Emergency Medicine

## 2014-03-22 DIAGNOSIS — C220 Liver cell carcinoma: Secondary | ICD-10-CM

## 2014-03-22 DIAGNOSIS — C228 Malignant neoplasm of liver, primary, unspecified as to type: Secondary | ICD-10-CM

## 2014-04-03 DIAGNOSIS — C228 Malignant neoplasm of liver, primary, unspecified as to type: Secondary | ICD-10-CM | POA: Diagnosis not present

## 2014-04-03 DIAGNOSIS — B182 Chronic viral hepatitis C: Secondary | ICD-10-CM | POA: Diagnosis not present

## 2014-04-03 DIAGNOSIS — K7460 Unspecified cirrhosis of liver: Secondary | ICD-10-CM | POA: Diagnosis not present

## 2014-04-16 DIAGNOSIS — C228 Malignant neoplasm of liver, primary, unspecified as to type: Secondary | ICD-10-CM | POA: Diagnosis not present

## 2014-04-16 LAB — COMPREHENSIVE METABOLIC PANEL
ALT: 32 U/L (ref 0–53)
AST: 32 U/L (ref 0–37)
Albumin: 4.1 g/dL (ref 3.5–5.2)
Alkaline Phosphatase: 40 U/L (ref 39–117)
BUN: 13 mg/dL (ref 6–23)
CO2: 27 mEq/L (ref 19–32)
Calcium: 9.3 mg/dL (ref 8.4–10.5)
Chloride: 102 mEq/L (ref 96–112)
Creat: 1.07 mg/dL (ref 0.50–1.35)
Glucose, Bld: 129 mg/dL — ABNORMAL HIGH (ref 70–99)
Potassium: 4.2 mEq/L (ref 3.5–5.3)
Sodium: 138 mEq/L (ref 135–145)
Total Bilirubin: 0.6 mg/dL (ref 0.2–1.2)
Total Protein: 7.2 g/dL (ref 6.0–8.3)

## 2014-04-24 ENCOUNTER — Ambulatory Visit
Admission: RE | Admit: 2014-04-24 | Discharge: 2014-04-24 | Disposition: A | Discharge status: Home / Self Care | Payer: 59 | Source: Ambulatory Visit | Attending: Radiology | Admitting: Radiology

## 2014-04-24 ENCOUNTER — Ambulatory Visit (HOSPITAL_COMMUNITY)
Admission: RE | Admit: 2014-04-24 | Discharge: 2014-04-24 | Disposition: A | Discharge status: Home / Self Care | Payer: 59 | Source: Ambulatory Visit | Attending: Diagnostic Radiology | Admitting: Diagnostic Radiology

## 2014-04-24 DIAGNOSIS — C220 Liver cell carcinoma: Secondary | ICD-10-CM

## 2014-04-24 DIAGNOSIS — R932 Abnormal findings on diagnostic imaging of liver and biliary tract: Secondary | ICD-10-CM | POA: Diagnosis not present

## 2014-04-24 DIAGNOSIS — B1920 Unspecified viral hepatitis C without hepatic coma: Secondary | ICD-10-CM | POA: Diagnosis not present

## 2014-04-24 DIAGNOSIS — C228 Malignant neoplasm of liver, primary, unspecified as to type: Secondary | ICD-10-CM | POA: Diagnosis not present

## 2014-04-24 DIAGNOSIS — C229 Malignant neoplasm of liver, not specified as primary or secondary: Secondary | ICD-10-CM | POA: Diagnosis not present

## 2014-04-24 DIAGNOSIS — Z09 Encounter for follow-up examination after completed treatment for conditions other than malignant neoplasm: Secondary | ICD-10-CM | POA: Diagnosis not present

## 2014-04-24 MED ORDER — GADOBENATE DIMEGLUMINE 529 MG/ML IV SOLN
20.0000 mL | Freq: Once | INTRAVENOUS | Status: AC | PRN
  Administered 2014-04-24: 20 mL via INTRAVENOUS

## 2014-04-24 NOTE — Progress Notes (Signed)
Appetite:  Good.  Weight stable.  Denies nausea, vomiting or diarrhea.  Denies pain associated w/ TACE.    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   Overall, doing well.  Delyle Weider Riki Rusk, RN 04/24/2014 10:39 AM

## 2014-04-25 ENCOUNTER — Other Ambulatory Visit: Payer: Self-pay | Admitting: Diagnostic Radiology

## 2014-04-25 DIAGNOSIS — C220 Liver cell carcinoma: Secondary | ICD-10-CM

## 2014-05-21 ENCOUNTER — Other Ambulatory Visit: Payer: Self-pay | Admitting: Radiology

## 2014-05-23 ENCOUNTER — Encounter (HOSPITAL_COMMUNITY): Payer: Self-pay | Admitting: Pharmacy Technician

## 2014-05-28 ENCOUNTER — Encounter (HOSPITAL_COMMUNITY): Payer: Self-pay

## 2014-05-28 ENCOUNTER — Encounter (HOSPITAL_COMMUNITY)
Admission: RE | Admit: 2014-05-28 | Discharge: 2014-05-28 | Disposition: A | Discharge status: Home / Self Care | Payer: 59 | Source: Ambulatory Visit | Attending: Interventional Radiology | Admitting: Interventional Radiology

## 2014-05-28 ENCOUNTER — Ambulatory Visit (HOSPITAL_COMMUNITY)
Admission: RE | Admit: 2014-05-28 | Discharge: 2014-05-28 | Disposition: A | Discharge status: Home / Self Care | Payer: 59 | Source: Ambulatory Visit | Attending: Anesthesiology | Admitting: Anesthesiology

## 2014-05-28 DIAGNOSIS — Z01818 Encounter for other preprocedural examination: Secondary | ICD-10-CM | POA: Diagnosis not present

## 2014-05-28 DIAGNOSIS — D490 Neoplasm of unspecified behavior of digestive system: Secondary | ICD-10-CM | POA: Diagnosis not present

## 2014-05-28 LAB — CBC WITH DIFFERENTIAL/PLATELET
Basophils Absolute: 0 10*3/uL (ref 0.0–0.1)
Basophils Relative: 0 % (ref 0–1)
Eosinophils Absolute: 0.1 10*3/uL (ref 0.0–0.7)
Eosinophils Relative: 3 % (ref 0–5)
HCT: 41 % (ref 39.0–52.0)
Hemoglobin: 13.1 g/dL (ref 13.0–17.0)
Lymphocytes Relative: 28 % (ref 12–46)
Lymphs Abs: 1.3 10*3/uL (ref 0.7–4.0)
MCH: 23.2 pg — ABNORMAL LOW (ref 26.0–34.0)
MCHC: 32 g/dL (ref 30.0–36.0)
MCV: 72.6 fL — ABNORMAL LOW (ref 78.0–100.0)
Monocytes Absolute: 0.6 10*3/uL (ref 0.1–1.0)
Monocytes Relative: 12 % (ref 3–12)
Neutro Abs: 2.6 10*3/uL (ref 1.7–7.7)
Neutrophils Relative %: 57 % (ref 43–77)
Platelets: 213 10*3/uL (ref 150–400)
RBC: 5.65 MIL/uL (ref 4.22–5.81)
RDW: 14.9 % (ref 11.5–15.5)
WBC: 4.6 10*3/uL (ref 4.0–10.5)

## 2014-05-28 LAB — COMPREHENSIVE METABOLIC PANEL
ALT: 51 U/L (ref 0–53)
AST: 46 U/L — ABNORMAL HIGH (ref 0–37)
Albumin: 3.7 g/dL (ref 3.5–5.2)
Alkaline Phosphatase: 37 U/L — ABNORMAL LOW (ref 39–117)
Anion gap: 11 (ref 5–15)
BUN: 15 mg/dL (ref 6–23)
CO2: 27 mEq/L (ref 19–32)
Calcium: 9.3 mg/dL (ref 8.4–10.5)
Chloride: 100 mEq/L (ref 96–112)
Creatinine, Ser: 0.99 mg/dL (ref 0.50–1.35)
GFR calc Af Amer: >90 mL/min (ref >90)
GFR calc non Af Amer: 83 mL/min — ABNORMAL LOW (ref >90)
Glucose, Bld: 153 mg/dL — ABNORMAL HIGH (ref 70–99)
Potassium: 4.5 mEq/L (ref 3.7–5.3)
Sodium: 138 mEq/L (ref 137–147)
Total Bilirubin: 0.5 mg/dL (ref 0.3–1.2)
Total Protein: 7.7 g/dL (ref 6.0–8.3)

## 2014-05-28 LAB — PROTIME-INR
INR: 1.03 (ref 0.00–1.49)
Prothrombin Time: 13.6 seconds (ref 11.6–15.2)

## 2014-05-28 LAB — APTT: aPTT: 32 seconds (ref 24–37)

## 2014-05-28 NOTE — Patient Instructions (Addendum)
Aaron [name]  05/28/2014   Your procedure is scheduled on:        06/01/2014  Report to [facility] Main Entrance and follow signs to  [place] arrive at 0600 AM.   Call this number if you have problems the morning of surgery [phone] or Presurgical Testing [phone].   Remember:  Do not eat food or drink liquids :After Midnight. EAT HEALTHY SNACK NIGHT PRIOR TO SURGERY.  For Living Will and/or Health Care Power Attorney Forms: please provide copy for your medical record, may bring AM of surgery (forms should be already notarized-we do not provide this service).       Take these medicines the morning of surgery with A SIP OF WATER: NONE                               You may not have any metal on your body including hair pins and piercings  Do not wear jewelry, lotions, powders, or deodorant.  Men may shave face and neck.               Do not bring valuables to the hospital. [place].  Contacts, dentures or bridgework may not be worn into surgery.  Leave suitcase in the car. After surgery it may be brought to your room.  For patients admitted to the hospital, checkout time is 11:00 AM the day of discharge.  ________________________________________________________________________  [facility] - Preparing for Surgery Before surgery, you can play an important role.  Because skin is not sterile, your skin needs to be as free of germs as possible.  You can reduce the number of germs on your skin by washing with CHG (chlorahexidine gluconate) soap before surgery.  CHG is an antiseptic cleaner which kills germs and bonds with the skin to continue killing germs even after washing. Please DO NOT use if you have an allergy to CHG or antibacterial soaps.  If your skin becomes reddened/irritated stop using the CHG and inform your nurse when you arrive at Short Stay. Do not shave (including legs and underarms) for at least 48 hours prior  to the first CHG shower.  You may shave your face/neck. Please follow these instructions carefully:  1.  Shower with CHG Soap the night before surgery and the  morning of Surgery.  2.  If you choose to wash your hair, wash your hair first as usual with your  normal  shampoo.  3.  After you shampoo, rinse your hair and body thoroughly to remove the  shampoo.                           4.  Use CHG as you would any other liquid soap.  You can apply chg directly  to the skin and wash                       Gently with a scrungie or clean washcloth.  5.  Apply the CHG Soap to your body ONLY FROM THE NECK DOWN.   Do not use on face/ open                           Wound or open sores. Avoid contact with eyes, ears mouth and genitals (private parts).  Wash face,  Genitals (private parts) with your normal soap.             6.  Wash thoroughly, paying special attention to the area where your surgery  will be performed.  7.  Thoroughly rinse your body with warm water from the neck down.  8.  DO NOT shower/wash with your normal soap after using and rinsing off  the CHG Soap.                9.  Pat yourself dry with a clean towel.            10.  Wear clean pajamas.            11.  Place clean sheets on your bed the night of your first shower and do not  sleep with pets. Day of Surgery : Do not apply any lotions/deodorants the morning of surgery.  Please wear clean clothes to the hospital/surgery center.  FAILURE TO FOLLOW THESE INSTRUCTIONS MAY RESULT IN THE CANCELLATION OF YOUR SURGERY PATIENT SIGNATURE_________________________________  NURSE SIGNATURE__________________________________  ________________________________________________________________________

## 2014-05-28 NOTE — Progress Notes (Signed)
Your patient has screened at an elevated risk for Obstructive Sleep Apnea using the Stop-Bang Tool during a pre-surgical vist. A score of 4 or greater is an elevated risk. Score of 4. 

## 2014-05-28 NOTE — Progress Notes (Signed)
Spoke withTtiffany in Interventional Radiology and she stated that both Dr Anselm Pancoast and Dr Kathlene Cote would be doing procedure.  Told her that Dr Kathlene Cote is on surgery Schedule and Dr Anselm Pancoast is under orders on the consent.

## 2014-05-31 ENCOUNTER — Other Ambulatory Visit: Payer: Self-pay | Admitting: Radiology

## 2014-06-01 ENCOUNTER — Encounter (HOSPITAL_COMMUNITY): Payer: 59 | Admitting: Anesthesiology

## 2014-06-01 ENCOUNTER — Encounter (HOSPITAL_COMMUNITY): Payer: Self-pay

## 2014-06-01 ENCOUNTER — Ambulatory Visit (HOSPITAL_COMMUNITY): Payer: 59 | Admitting: Anesthesiology

## 2014-06-01 ENCOUNTER — Ambulatory Visit (HOSPITAL_COMMUNITY)
Admission: RE | Admit: 2014-06-01 | Discharge: 2014-06-01 | Disposition: A | Discharge status: Home / Self Care | Payer: 59 | Source: Ambulatory Visit | Attending: Diagnostic Radiology | Admitting: Diagnostic Radiology

## 2014-06-01 ENCOUNTER — Encounter (HOSPITAL_COMMUNITY)
Admission: RE | Disposition: A | Discharge status: Home / Self Care | Payer: Self-pay | Source: Ambulatory Visit | Attending: Diagnostic Radiology

## 2014-06-01 ENCOUNTER — Observation Stay (HOSPITAL_COMMUNITY)
Admission: RE | Admit: 2014-06-01 | Discharge: 2014-06-02 | Disposition: A | Discharge status: Home / Self Care | Payer: 59 | Source: Ambulatory Visit | Attending: Diagnostic Radiology | Admitting: Diagnostic Radiology

## 2014-06-01 ENCOUNTER — Encounter (HOSPITAL_COMMUNITY): Payer: Self-pay | Admitting: *Deleted

## 2014-06-01 DIAGNOSIS — C220 Liver cell carcinoma: Principal | ICD-10-CM | POA: Diagnosis present

## 2014-06-01 DIAGNOSIS — I10 Essential (primary) hypertension: Secondary | ICD-10-CM | POA: Insufficient documentation

## 2014-06-01 DIAGNOSIS — Z7982 Long term (current) use of aspirin: Secondary | ICD-10-CM | POA: Insufficient documentation

## 2014-06-01 DIAGNOSIS — B1920 Unspecified viral hepatitis C without hepatic coma: Secondary | ICD-10-CM | POA: Insufficient documentation

## 2014-06-01 DIAGNOSIS — Z79899 Other long term (current) drug therapy: Secondary | ICD-10-CM | POA: Insufficient documentation

## 2014-06-01 DIAGNOSIS — E119 Type 2 diabetes mellitus without complications: Secondary | ICD-10-CM | POA: Diagnosis not present

## 2014-06-01 DIAGNOSIS — Z87891 Personal history of nicotine dependence: Secondary | ICD-10-CM | POA: Insufficient documentation

## 2014-06-01 DIAGNOSIS — K769 Liver disease, unspecified: Secondary | ICD-10-CM | POA: Diagnosis not present

## 2014-06-01 LAB — APTT: aPTT: 29 seconds (ref 24–37)

## 2014-06-01 LAB — TYPE AND SCREEN
ABO/RH(D): B POS
Antibody Screen: NEGATIVE

## 2014-06-01 LAB — ABO/RH: ABO/RH(D): B POS

## 2014-06-01 LAB — GLUCOSE, CAPILLARY
Glucose-Capillary: 113 mg/dL — ABNORMAL HIGH (ref 70–99)
Glucose-Capillary: 97 mg/dL (ref 70–99)
Glucose-Capillary: 179 mg/dL — ABNORMAL HIGH (ref 70–99)

## 2014-06-01 LAB — PROTIME-INR
INR: 1.03 (ref 0.00–1.49)
Prothrombin Time: 13.7 seconds (ref 11.6–15.2)

## 2014-06-01 SURGERY — RADIO FREQUENCY ABLATION
Anesthesia: General

## 2014-06-01 MED ORDER — PROPOFOL 10 MG/ML IV BOLUS
INTRAVENOUS | Status: AC
  Filled 2014-06-01: qty 20

## 2014-06-01 MED ORDER — LIDOCAINE HCL (CARDIAC) 20 MG/ML IV SOLN
INTRAVENOUS | Status: DC | PRN
  Administered 2014-06-01: 50 mg via INTRAVENOUS

## 2014-06-01 MED ORDER — FENTANYL CITRATE 0.05 MG/ML IJ SOLN
INTRAMUSCULAR | Status: AC
  Filled 2014-06-01: qty 5

## 2014-06-01 MED ORDER — INSULIN GLARGINE 100 UNIT/ML ~~LOC~~ SOLN
31.0000 [IU] | Freq: Every day | SUBCUTANEOUS | Status: DC
  Administered 2014-06-01: 31 [IU] via SUBCUTANEOUS
  Filled 2014-06-01 (×2): qty 0.31

## 2014-06-01 MED ORDER — ONDANSETRON HCL 4 MG/2ML IJ SOLN
INTRAMUSCULAR | Status: DC | PRN
Start: 2014-06-01 — End: 2014-06-01
  Administered 2014-06-01: 4 mg via INTRAVENOUS

## 2014-06-01 MED ORDER — INSULIN GLARGINE 100 UNIT/ML ~~LOC~~ SOLN
31.0000 [IU] | Freq: Every morning | SUBCUTANEOUS | Status: DC
  Filled 2014-06-01 (×2): qty 0.31

## 2014-06-01 MED ORDER — SUCCINYLCHOLINE CHLORIDE 20 MG/ML IJ SOLN
INTRAMUSCULAR | Status: DC | PRN
  Administered 2014-06-01: 100 mg via INTRAVENOUS

## 2014-06-01 MED ORDER — MIDAZOLAM HCL 2 MG/2ML IJ SOLN
INTRAMUSCULAR | Status: AC
  Filled 2014-06-01: qty 2

## 2014-06-01 MED ORDER — NEOSTIGMINE METHYLSULFATE 10 MG/10ML IV SOLN
INTRAVENOUS | Status: DC | PRN
  Administered 2014-06-01: 5 mg via INTRAVENOUS

## 2014-06-01 MED ORDER — SENNOSIDES-DOCUSATE SODIUM 8.6-50 MG PO TABS: 1.0000 | ORAL_TABLET | Freq: Every day | ORAL | Status: DC | PRN

## 2014-06-01 MED ORDER — INSULIN GLARGINE 100 UNIT/ML ~~LOC~~ SOLN: 31.0000 [IU] | Freq: Every morning | SUBCUTANEOUS | Status: DC

## 2014-06-01 MED ORDER — HYDROMORPHONE HCL 1 MG/ML IJ SOLN: 0.2500 mg | INTRAMUSCULAR | Status: DC | PRN

## 2014-06-01 MED ORDER — PIPERACILLIN SOD-TAZOBACTAM SO 2.25 (2-0.25) G IV SOLR
3.3750 g | Freq: Once | INTRAVENOUS | Status: AC
  Administered 2014-06-01: 3.375 g via INTRAVENOUS
  Filled 2014-06-01: qty 3.38

## 2014-06-01 MED ORDER — INSULIN GLARGINE 100 UNIT/ML SOLOSTAR PEN
31.0000 [IU] | PEN_INJECTOR | Freq: Every morning | SUBCUTANEOUS | Status: DC
Start: 2014-06-01 — End: 2014-06-01

## 2014-06-01 MED ORDER — ONDANSETRON HCL 4 MG/2ML IJ SOLN: 4.0000 mg | Freq: Four times a day (QID) | INTRAMUSCULAR | Status: DC | PRN

## 2014-06-01 MED ORDER — LACTATED RINGERS IV SOLN: INTRAVENOUS | Status: DC

## 2014-06-01 MED ORDER — GLYCOPYRROLATE 0.2 MG/ML IJ SOLN
INTRAMUSCULAR | Status: DC | PRN
  Administered 2014-06-01: .8 mg via INTRAVENOUS

## 2014-06-01 MED ORDER — FENTANYL CITRATE 0.05 MG/ML IJ SOLN
INTRAMUSCULAR | Status: DC | PRN
Start: 2014-06-01 — End: 2014-06-01
  Administered 2014-06-01: 100 ug via INTRAVENOUS
  Administered 2014-06-01 (×2): 50 ug via INTRAVENOUS

## 2014-06-01 MED ORDER — CISATRACURIUM BESYLATE (PF) 10 MG/5ML IV SOLN
INTRAVENOUS | Status: DC | PRN
  Administered 2014-06-01 (×3): 2 mg via INTRAVENOUS
  Administered 2014-06-01: 6 mg via INTRAVENOUS

## 2014-06-01 MED ORDER — HYDROCODONE-ACETAMINOPHEN 5-325 MG PO TABS: 1.0000 | ORAL_TABLET | ORAL | Status: DC | PRN

## 2014-06-01 MED ORDER — LACTATED RINGERS IV SOLN
INTRAVENOUS | Status: DC
  Administered 2014-06-01 (×2): via INTRAVENOUS

## 2014-06-01 MED ORDER — IOHEXOL 300 MG/ML  SOLN
75.0000 mL | Freq: Once | INTRAMUSCULAR | Status: AC | PRN
  Administered 2014-06-01: 75 mL via INTRAVENOUS

## 2014-06-01 MED ORDER — MIDAZOLAM HCL 5 MG/5ML IJ SOLN
INTRAMUSCULAR | Status: DC | PRN
  Administered 2014-06-01: 2 mg via INTRAVENOUS

## 2014-06-01 MED ORDER — DOCUSATE SODIUM 100 MG PO CAPS
100.0000 mg | ORAL_CAPSULE | Freq: Two times a day (BID) | ORAL | Status: DC
  Administered 2014-06-01 – 2014-06-02 (×2): 100 mg via ORAL
  Filled 2014-06-01 (×3): qty 1

## 2014-06-01 NOTE — Anesthesia Preprocedure Evaluation (Addendum)
Anesthesia Evaluation  Patient identified by MRN, date of birth, ID band Patient awake    Reviewed: Allergy & Precautions, H&P , NPO status , Patient's Chart, lab work & pertinent test results  Airway Mallampati: II TM Distance: >3 FB Neck ROM: full    Dental  (+) Edentulous Upper, Edentulous Lower, Dental Advisory Given   Pulmonary neg pulmonary ROS, former smoker,  breath sounds clear to auscultation  Pulmonary exam normal       Cardiovascular Exercise Tolerance: Good hypertension, Pt. on medications Rhythm:regular Rate:Normal     Neuro/Psych negative neurological ROS  negative psych ROS   GI/Hepatic negative GI ROS, (+) Hepatitis -, CLiver cancer   Endo/Other  diabetes, Well Controlled, Type 2, Oral Hypoglycemic Agents  Renal/GU negative Renal ROS  negative genitourinary   Musculoskeletal   Abdominal   Peds  Hematology negative hematology ROS (+)   Anesthesia Other Findings   Reproductive/Obstetrics negative OB ROS                          Anesthesia Physical Anesthesia Plan  ASA: III  Anesthesia Plan: General   Post-op Pain Management:    Induction: Intravenous  Airway Management Planned: Oral ETT  Additional Equipment:   Intra-op Plan:   Post-operative Plan: Extubation in OR  Informed Consent: I have reviewed the patients History and Physical, chart, labs and discussed the procedure including the risks, benefits and alternatives for the proposed anesthesia with the patient or authorized representative who has indicated his/her understanding and acceptance.   Dental Advisory Given  Plan Discussed with: CRNA and Surgeon  Anesthesia Plan Comments:         Anesthesia Quick Evaluation

## 2014-06-01 NOTE — H&P (Signed)
Chief Complaint: "I am here for my liver tumor procedure."  Referring Physician(s): Roosevelt Locks NP  History of Present Illness: Aaron [name] is a 68 y.o. male with hepatitis C and hepatocellular carcinoma s/p DEB-TACE of right hepatic lesion on 03/06/14 which he did well and denies any N/V, fever or abdominal pain today and minimal symptoms of RUQ pain post procedure that have resolved. Follow up MRI imaging revealed decrease in size of tumor but with residual arterial nodular enhancement. He was seen in clinic on 04/24/14 and deemed a candidate for CT guided thermal ablation for residual tumor. He denies any chest pain, shortness of breath or palpitations. He denies any active signs of bleeding or excessive bruising. He denies any recent fever or chills. The patient denies any history of sleep apnea or chronic oxygen use. He has previously tolerated sedation without complications.   Past Medical History  Diagnosis Date  . Hypertension   . Diabetes mellitus without complication   . Hyperlipidemia   . Hepatocellular carcinoma 01/30/2014    Path  . Hyperlipidemia   . Hepatitis     hepatitis C    History reviewed. No pertinent past surgical history.  Allergies: Review of patient's allergies indicates no known allergies.  Medications: Prior to Admission medications   Medication Sig Start Date End Date Taking? Authorizing Provider  aspirin EC 81 MG tablet Take 81 mg by mouth every morning.     Historical Provider, MD  cholecalciferol (VITAMIN D) 1000 UNITS tablet Take 1,000 Units by mouth every morning.     Historical Provider, MD  Insulin Glargine (LANTUS SOLOSTAR) 100 UNIT/ML Solostar Pen Inject 31 Units into the skin every morning.     Historical Provider, MD  lisinopril (PRINIVIL,ZESTRIL) 20 MG tablet Take 20 mg by mouth every morning.     Historical Provider, MD  Multiple Vitamins-Minerals (MULTIVITAMIN WITH MINERALS) tablet Take 1 tablet by mouth every morning.     Historical  Provider, MD  simvastatin (ZOCOR) 40 MG tablet Take 40 mg by mouth every morning.     Historical Provider, MD  sitaGLIPtin-metformin (JANUMET) 50-1000 MG per tablet Take 1 tablet by mouth at bedtime.     Historical Provider, MD    No family history on file.  History   Social History  . Marital Status: Married    Spouse Name: N/A    Number of Children: N/A  . Years of Education: N/A   Social History Main Topics  . Smoking status: Former Smoker    Quit date: 01/30/1994  . Smokeless tobacco: Never Used     Comment: stopped 25 yrs ago  . Alcohol Use: None     Comment: stoped 25 yrs ago  . Drug Use: No  . Sexual Activity: Not Currently   Other Topics Concern  . None   Social History Narrative  . None   ECOG Status: 0 - Asymptomatic  Review of Systems: A 12 point ROS discussed and pertinent positives are indicated in the HPI above.  All other systems are negative.  Review of Systems  Constitutional: Negative for fever and chills.  Respiratory: Negative for chest tightness, shortness of breath and wheezing.   Gastrointestinal: Negative for abdominal pain, blood in stool and abdominal distention.  Genitourinary: Negative for dysuria and hematuria.   Vital Signs: T: 97.6F, HR: 68 bpm, BP: 146/84 mmHg, o2: 99% RA, Resp: 18 rpm  Physical Exam  Constitutional: He is oriented to person, place, and time. He appears well-developed and well-nourished.  No distress.  HENT:  Head: Normocephalic and atraumatic.  Neck: No tracheal deviation present.  Cardiovascular: Normal rate and regular rhythm.  Exam reveals no gallop and no friction rub.   No murmur heard. Pulmonary/Chest: Effort normal and breath sounds normal. No respiratory distress. He has no wheezes. He has no rales.  Abdominal: Soft. Bowel sounds are normal. He exhibits no distension. There is no tenderness.  Musculoskeletal: He exhibits no edema.  Neurological: He is alert and oriented to person, place, and time.  Skin:  Skin is warm and dry. He is not diaphoretic.  Psychiatric: He has a normal mood and affect. His behavior is normal. Thought content normal.    Imaging: Dg Chest 2 View  05/28/2014   CLINICAL DATA:  Preop for liver tumor ablation  EXAM: CHEST  2 VIEW  COMPARISON:  None.  FINDINGS: Cardiomediastinal silhouette is unremarkable. No acute infiltrate or pleural effusion. No pulmonary edema. Mild perihilar bronchial markings without focal consolidation. Bony thorax is unremarkable.  IMPRESSION: No acute infiltrate or pulmonary edema. Mild perihilar increased bronchial markings without focal consolidation.   Electronically Signed   By: Lahoma Crocker M.D.   On: 05/28/2014 09:05   Labs:  CBC:  Recent Labs  03/06/14 0800 03/13/14 0800 03/14/14 0448 05/28/14 0835  WBC 5.7 5.9 9.5 4.6  HGB 13.5 13.1 13.4 13.1  HCT 42.2 40.5 41.8 41.0  PLT 212 229 251 213    COAGS:  Recent Labs  01/30/14 1313 03/06/14 0800 03/13/14 0800 05/28/14 0835 06/01/14 0625  INR 1.01 0.96 0.91 1.03 1.03  APTT 30 30  --  32 29    BMP:  Recent Labs  03/06/14 0800 03/13/14 0800 03/14/14 0448 04/16/14 0705 05/28/14 0835  NA 139 137 136* 138 138  K 4.3 4.3 5.2 4.2 4.5  CL 101 99 98 102 100  CO2 25 26 24 27 27   GLUCOSE 124* 124* 170* 129* 153*  BUN 17 16 22 13 15   CALCIUM 9.7 9.6 9.1 9.3 9.3  CREATININE 1.07 1.12 1.06 1.07 0.99  GFRNONAA 70* 66* 71*  --  83*  GFRAA 81* 77* 82*  --  >90    LIVER FUNCTION TESTS:  Recent Labs  03/13/14 0800 03/14/14 0448 04/16/14 0705 05/28/14 0835  BILITOT 0.5 0.5 0.6 0.5  AST 34 39* 32 46*  ALT 42 40 32 51  ALKPHOS 45 45 40 37*  PROT 7.6 7.8 7.2 7.7  ALBUMIN 3.7 3.5 4.1 3.7    Assessment and Plan: Hepatitis C Hepatocellular carcinoma S/p DEB-TACE of right hepatic lesion 03/06/14 Follow up imaging revealed decrease in size of tumor but with residual arterial nodular enhancement Seen in clinic on 04/24/14 and deemed a candidate for CT guided thermal ablation  of residual right hepatic tumor.  Patient is here today for procedure and has been NPO, no blood thinners taken, labs reviewed, afebrile and anesthesia has seen. Risks and Benefits discussed with the patient. All of the patient's questions were answered, patient is agreeable to proceed. Consent signed and in chart. Patient will be admitted overnight for observation and pain control with am labs. Possible D/C in am if stable.      SignedHedy Jacob 06/01/2014, 8:12 AM

## 2014-06-01 NOTE — Progress Notes (Signed)
Subjective: Patient is s/p thermal ablation of right hepatic lesion today s/p extubation c/o sore throat. He denies any bleeding, abdominal pain, N/V. He has ate dinner without difficulty. Foley with clear yellow urine.   Allergies: Review of patient's allergies indicates no known allergies.  Medications: Prior to Admission medications   Medication Sig Start Date End Date Taking? Authorizing Provider  aspirin EC 81 MG tablet Take 81 mg by mouth every morning.    Yes Historical Provider, MD  cholecalciferol (VITAMIN D) 1000 UNITS tablet Take 1,000 Units by mouth every morning.    Yes Historical Provider, MD  Insulin Glargine (LANTUS SOLOSTAR) 100 UNIT/ML Solostar Pen Inject 31 Units into the skin every morning.    Yes Historical Provider, MD  lisinopril (PRINIVIL,ZESTRIL) 20 MG tablet Take 20 mg by mouth every morning.    Yes Historical Provider, MD  Multiple Vitamins-Minerals (MULTIVITAMIN WITH MINERALS) tablet Take 1 tablet by mouth every morning.    Yes Historical Provider, MD  simvastatin (ZOCOR) 40 MG tablet Take 40 mg by mouth every morning.    Yes Historical Provider, MD  sitaGLIPtin-metformin (JANUMET) 50-1000 MG per tablet Take 1 tablet by mouth at bedtime.    Yes Historical Provider, MD    Review of Systems  Vital Signs: BP 154/70  Pulse 58  Temp(Src) 98.3 F (36.8 C) (Oral)  Resp 16  Ht 6' (1.829 m)  Wt 269 lb (122.018 kg)  BMI 36.48 kg/m2  SpO2 98%  Physical Exam General: A&Ox3, NAD, sitting up in bed eating Abd: Soft, NT, ND, (+) BS, RUQ dressing C/D/I, NT, no signs of bleeding/hematoma  Imaging: Ct Guide Tissue Ablation  06/01/2014   CLINICAL DATA:  68 year old male with an hepatitis-C and hepatocellular carcinoma. Hepatocellular carcinoma was initially treated with transarterial chemoembolization. Follow-up imaging suggests that the lesion is now small enough for more definitive treatment with thermal ablation. Patient presents for image guided thermal ablation.   EXAM: CT-GUIDED AND ULTRASOUND-GUIDED THERMAL ABLATION OF THE HEPATOCELLULAR CARCINOMA  Physicians: Stephan Minister. Anselm Pancoast, MD and Aletta Edouard, MD  MEDICATIONS: Zosyn 3.375 g.  ANESTHESIA/SEDATION: Procedure was performed with general anesthesia.  PROCEDURE: The procedure was explained to the patient. The risks and benefits of the procedure were discussed and the patient's questions were addressed. Informed consent was obtained from the patient. Patient was placed under general anesthesia. CT images of the abdomen were obtained. The right side of the abdomen was prepped and draped in sterile fashion. Maximal barrier sterile technique was utilized including caps, mask, sterile gowns, sterile gloves, sterile drape, hand hygiene and skin antiseptic. The right hepatic lesion was identified with CT and ultrasound. A 15 cm PR NeuWave probe was directed into the lesion with ultrasound guidance. Position was confirmed with CT. A second PR probe was directed into the lesion and directed more caudal. 75 mg Omnipaque 300 was given intravenously in order to better evaluate the lesion with CT imaging. The probe tips were advanced just beyond the lesion. The lesion was treated at 24 Watts for 10 min. Imaging was obtained during the ablation in order to evaluate the ablation site and probe positions. The probes were slightly pulled back in order to treat the more superficial aspect of the lesion. A second treatment was performed at 24 Watts for 5 min. Following the ablation, follow-up imaging was obtained. The probes were removed using the cautery function on the probes. Sterile dressing was placed over the puncture sites.  FINDINGS: There is an irregular shaped lesion in the  right hepatic lobe. This lesion was more conspicuous with ultrasound. The initial probe was placed with ultrasound guidance. First probe was well positioned within the lesion. Second probe was placed along the caudal aspect of the lesion. Small amount of gas at the  ablation site as expected.  COMPLICATIONS: None  IMPRESSION: Successful CT and ultrasound-guided microwave ablation of the right hepatic lesion.   Electronically Signed   By: Markus Daft M.D.   On: 06/01/2014 13:18    Labs:  CBC:  Recent Labs  03/06/14 0800 03/13/14 0800 03/14/14 0448 05/28/14 0835  WBC 5.7 5.9 9.5 4.6  HGB 13.5 13.1 13.4 13.1  HCT 42.2 40.5 41.8 41.0  PLT 212 229 251 213    COAGS:  Recent Labs  01/30/14 1313 03/06/14 0800 03/13/14 0800 05/28/14 0835 06/01/14 0625  INR 1.01 0.96 0.91 1.03 1.03  APTT 30 30  --  32 29    BMP:  Recent Labs  03/06/14 0800 03/13/14 0800 03/14/14 0448 04/16/14 0705 05/28/14 0835  NA 139 137 136* 138 138  K 4.3 4.3 5.2 4.2 4.5  CL 101 99 98 102 100  CO2 25 26 24 27 27   GLUCOSE 124* 124* 170* 129* 153*  BUN 17 16 22 13 15   CALCIUM 9.7 9.6 9.1 9.3 9.3  CREATININE 1.07 1.12 1.06 1.07 0.99  GFRNONAA 70* 66* 71*  --  83*  GFRAA 81* 77* 82*  --  >90    LIVER FUNCTION TESTS:  Recent Labs  03/13/14 0800 03/14/14 0448 04/16/14 0705 05/28/14 0835  BILITOT 0.5 0.5 0.6 0.5  AST 34 39* 32 46*  ALT 42 40 32 51  ALKPHOS 45 45 40 37*  PROT 7.6 7.8 7.2 7.7  ALBUMIN 3.7 3.5 4.1 3.7    Assessment and Plan: Hepatitis C  Hepatocellular carcinoma  S/p DEB-TACE of right hepatic lesion 03/06/14  Follow up imaging revealed decrease in size of tumor but with residual arterial nodular enhancement.  Seen in clinic on 04/24/14 and deemed a candidate for CT guided thermal ablation of residual right hepatic tumor.  S/p successful thermal ablation with (2) Neuwave probes of right hepatic lesion, extubated Patient denies any pain, N/V Foley to be removed later this evening D/c in am if stable and F/U in 4 weeks with Dr. Anselm Pancoast in clinic with MRI. Labs ordered for am.     Signed: Hedy Jacob 06/01/2014, 3:18 PM

## 2014-06-01 NOTE — Transfer of Care (Signed)
Immediate Anesthesia Transfer of Care Note  Patient: Aaron [name]  Procedure(s) Performed: Procedure(s): THERMAL ABLATION OF THE LIVER (N/A)  Patient Location: PACU  Anesthesia Type:General  Level of Consciousness: awake, alert , sedated and patient cooperative  Airway & Oxygen Therapy: Patient Spontanous Breathing and Patient connected to face mask oxygen  Post-op Assessment: Report given to PACU RN and Post -op Vital signs reviewed and stable  Post vital signs: Reviewed and stable  Complications: No apparent anesthesia complications

## 2014-06-01 NOTE — H&P (Signed)
Agree with PA note. 

## 2014-06-01 NOTE — Procedures (Signed)
Post-Procedure Note  Pre-operative Diagnosis: HCC       Post-operative Diagnosis: HCC   Indications: Additional treatment for Sarben.  Procedure Details:   Informed consent obtained.  General anesthesia was used for this case.  Two microwave probes were placed in the right hepatic lesion with CT and US guidance.  The lesion was treated for total of 15 minutes.  Devices removed without complication.  Findings: Irregular shaped lesion in right hepatic lobe.  Two PR Neuwave probes placed in lesion.  The lesion was initially treated for 10 minutes.  Probes were slightly pulled back and treated for additional 5 minutes.   Complications: None  Plan: Observation in hospital overnight.

## 2014-06-01 NOTE — Anesthesia Postprocedure Evaluation (Signed)
  Anesthesia Post-op Note  Patient: Aaron [name]  Procedure(s) Performed: Procedure(s) (LRB): THERMAL ABLATION OF THE LIVER (N/A)  Patient Location: PACU  Anesthesia Type: General  Level of Consciousness: awake and alert   Airway and Oxygen Therapy: Patient Spontanous Breathing  Post-op Pain: mild  Post-op Assessment: Post-op Vital signs reviewed, Patient's Cardiovascular Status Stable, Respiratory Function Stable, Patent Airway and No signs of Nausea or vomiting  Last Vitals:  Filed Vitals:   06/01/14 1315  BP: 155/77  Pulse: 54  Temp: 36.6 C  Resp: 13    Post-op Vital Signs: stable   Complications: No apparent anesthesia complications

## 2014-06-01 NOTE — Progress Notes (Signed)
No evidence of complication after thermal ablation of liver.  Overnight observation.  Probable discharge in AM.

## 2014-06-02 ENCOUNTER — Other Ambulatory Visit: Payer: Self-pay | Admitting: Radiology

## 2014-06-02 DIAGNOSIS — C220 Liver cell carcinoma: Secondary | ICD-10-CM

## 2014-06-02 LAB — CBC
HCT: 37.6 % — ABNORMAL LOW (ref 39.0–52.0)
Hemoglobin: 12.1 g/dL — ABNORMAL LOW (ref 13.0–17.0)
MCH: 22.8 pg — ABNORMAL LOW (ref 26.0–34.0)
MCHC: 32.2 g/dL (ref 30.0–36.0)
MCV: 70.9 fL — ABNORMAL LOW (ref 78.0–100.0)
Platelets: 191 10*3/uL (ref 150–400)
RBC: 5.3 MIL/uL (ref 4.22–5.81)
RDW: 15 % (ref 11.5–15.5)
WBC: 6.5 10*3/uL (ref 4.0–10.5)

## 2014-06-02 LAB — COMPREHENSIVE METABOLIC PANEL
ALT: 310 U/L — ABNORMAL HIGH (ref 0–53)
AST: 423 U/L — ABNORMAL HIGH (ref 0–37)
Albumin: 3.5 g/dL (ref 3.5–5.2)
Alkaline Phosphatase: 40 U/L (ref 39–117)
Anion gap: 12 (ref 5–15)
BUN: 16 mg/dL (ref 6–23)
CO2: 25 mEq/L (ref 19–32)
Calcium: 9 mg/dL (ref 8.4–10.5)
Chloride: 99 mEq/L (ref 96–112)
Creatinine, Ser: 1.02 mg/dL (ref 0.50–1.35)
GFR calc Af Amer: 86 mL/min — ABNORMAL LOW (ref >90)
GFR calc non Af Amer: 74 mL/min — ABNORMAL LOW (ref >90)
Glucose, Bld: 140 mg/dL — ABNORMAL HIGH (ref 70–99)
Potassium: 4.7 mEq/L (ref 3.7–5.3)
Sodium: 136 mEq/L — ABNORMAL LOW (ref 137–147)
Total Bilirubin: 0.9 mg/dL (ref 0.3–1.2)
Total Protein: 7.1 g/dL (ref 6.0–8.3)

## 2014-06-02 MED ORDER — INSULIN GLARGINE 100 UNIT/ML ~~LOC~~ SOLN
31.0000 [IU] | Freq: Every day | SUBCUTANEOUS | Status: DC
  Administered 2014-06-02: 31 [IU] via SUBCUTANEOUS
  Filled 2014-06-02: qty 0.31

## 2014-06-02 MED ORDER — HYDROCODONE-ACETAMINOPHEN 5-325 MG PO TABS: 1.0000 | ORAL_TABLET | ORAL | Status: DC | PRN

## 2014-06-02 NOTE — Progress Notes (Signed)
UR completed 

## 2014-06-02 NOTE — Discharge Instructions (Signed)
Radiofrequency Ablation of Liver Tumors Ablation is a procedure that destroys specific cells in the body. Radiofrequency means that high-energy radio waves are used for this procedure. In radiofrequency ablation of liver tumors, a needle-like probe is placed close to the tumor. The probe uses radio waves to produce heat that kills the cancer cells. Months later, the dead tumor cells turn into harmless scar tissue. This procedure is usually used:   For smaller tumors (less than about 1 in [3.8 cm]).   In people whose medical condition makes surgery too dangerous.   For tumors that are in risky locations, have not shrunk with chemotherapy, or that have come back after having been removed through surgery.   In people who have certain types of liver cancer and are on a waiting list for a liver transplant.  LET [facility] CARE PROVIDER KNOW ABOUT:   All allergies you have.  All medicines that you are taking, including vitamins, herbs, eye drops, creams, and over-the-counter medicines.  Previous problems you or members of your family have had with the use of anesthetics.  Any blood disorders you have.  Previous surgeries you have had.  Medical conditions you have.  Possibility of pregnancy, if this applies. RISKS AND COMPLICATIONS  Generally, this is a safe procedure. However, as with any procedure, complications can occur. Possible complications include:   Infection.   Bleeding.   Pain.   Flu-like symptoms, including fever and achiness.   Injury to the surrounding organs, such as a lung or the intestines. BEFORE THE PROCEDURE   You may have blood tests done. These tests can help show how well your kidneys and liver are working. They can also show how well your blood clots.   Only take medicines as directed by your health care provider. You may need to stop taking medicines (such as blood thinners, aspirin, or nonsteroidal anti-inflammatory drugs) before the procedure.    Do not eat or drink for at least 6 hours before the procedure, or as directed by your health care provider.   Arrange for someone to drive you home after the procedure.  PROCEDURE   You will lie on an exam table and will be connected to monitors that keep track of your heart rate, blood pressure, and breathing throughout the procedure. You will have an IV tube placed in a vein.   You may be given a medicine that makes you go to sleep throughout the procedure (general anesthetic) or a medicine that helps you relax (sedative). You may also be given a medicine to numb the area where the probe will pass through the skin (local anesthetic).   Radiofrequency ablation can be done:   Through a regular surgical cut (incision). This type of ablation is called surgical radiofrequency ablation.   Through a tiny surgical incision, using a camera-like device to guide the probe to the liver tumor. This type of ablation is called laparoscopic radiofrequency ablation.   By using needle-sized electrodes that are passed through the skin directly into the area of the liver being treated. This type of ablation is called percutaneous radiofrequency ablation.   Ultrasound or CT scans are used to make sure the tip of the probe is in the right location.   Once the probe has been situated next to the tumor, radio waves will produce heat that kills the tumor cells. Depending on the size of the tumor, the probe may need to be repositioned several times.   Once the tumor has been destroyed,  the probe will be removed and a bandage will be applied.  AFTER THE PROCEDURE  If you received a sedative or a general anesthetic during the procedure, you will be sleepy for the first few hours.   You may have some pain or nausea. This can usually be controlled with medicines.   You will stay in the recovery room until you are awake and able to drink fluids.  Document Released: 12/27/2008 Document Revised:  08/15/2013 Document Reviewed: 04/17/2013 [facility] Patient Information 2015 [place], [place]. This information is not intended to replace advice given to you by your health care provider. Make sure you discuss any questions you have with your health care provider.

## 2014-06-02 NOTE — Progress Notes (Signed)
Patient d/c instructions given and reviewed with the patient,verbalized understanding. Dsg to R side clean, dry and intact. Denies pain. Stable .Sandie Ano RN

## 2014-06-02 NOTE — Discharge Summary (Signed)
Physician Discharge Summary  Patient ID: Stephano Cay MRN: [id number] DOB/AGE: [birth year] 68 y.o.  Admit date: 06/01/2014 Discharge date: 06/02/2014  Admission Diagnoses: Active Problems:   Hepatocellular carcinoma  Discharge Diagnoses:  Active Problems:   Hepatocellular carcinoma    Procedures: Procedure(s): CT GUIDED PERCUTANEOUS THERMAL ABLATION OF THE LIVER - 06/02/2014  Discharged Condition: good  Hospital Course: HPI: Momodou Fracasso is a 68 y.o. male with hepatitis C and hepatocellular carcinoma s/p DEB-TACE of right hepatic lesion on 03/06/14 which he did well and denies any N/V, fever or abdominal pain today and minimal symptoms of RUQ pain post procedure that have resolved. Follow up MRI imaging revealed decrease in size of tumor but with residual arterial nodular enhancement. He was seen in clinic on 04/24/14 and deemed a candidate for CT guided thermal ablation for residual tumor. He denies any chest pain, shortness of breath or palpitations. He denies any active signs of bleeding or excessive bruising. He denies any recent fever or chills. The patient denies any history of sleep apnea or chronic oxygen use. He has previously tolerated sedation without complications  Summary: Pt brought to CT suite and after General anesthesia initiated, underwent successful CT guided percutaneous thermal ablation of his liver tumor. He tolerated the procedure well with no immediate complications. He was then admitted to the floor in stable condition for overnight observation. He has voided on his own, has been tolerating regular diet, and reports minimal pain. POD #1 exam and assessment is stable, pt is determined to be stable for discharge. Slight bump in transaminases as expected. All discharge instructions, restrictions, and follow up plans were discussed with pt and his wife. The patient will be seen in 4 weeks with follow up MRI of the abdomen as well.   Consults: None   Discharge  Exam: Blood pressure 146/70, pulse 88, temperature 98.6 F (37 C), temperature source Oral, resp. rate 16, height 6' (1.829 m), weight 269 lb (122.018 kg), SpO2 97.00%. General appearance: alert and no distress Lungs: CTA without w/r/r Heart: Regular Abdomen: RUQ perc access sites clean, NT, no hematoma, no thermal burn  Discharge labs:  CMP     Component Value Date/Time   NA 136* 06/02/2014 0715   K 4.7 06/02/2014 0715   CL 99 06/02/2014 0715   CO2 25 06/02/2014 0715   GLUCOSE 140* 06/02/2014 0715   BUN 16 06/02/2014 0715   CREATININE 1.02 06/02/2014 0715   CREATININE 1.07 04/16/2014 0705   CALCIUM 9.0 06/02/2014 0715   PROT 7.1 06/02/2014 0715   ALBUMIN 3.5 06/02/2014 0715   AST 423* 06/02/2014 0715   ALT 310* 06/02/2014 0715   ALKPHOS 40 06/02/2014 0715   BILITOT 0.9 06/02/2014 0715   GFRNONAA 74* 06/02/2014 0715   GFRAA 86* 06/02/2014 0715     Disposition: 01-Home or Self Care   Discharge Instructions   Call MD for:  difficulty breathing, headache or visual disturbances    Complete by:  As directed      Call MD for:  persistant nausea and vomiting    Complete by:  As directed      Call MD for:  redness, tenderness, or signs of infection (pain, swelling, redness, odor or green/yellow discharge around incision site)    Complete by:  As directed      Call MD for:  severe uncontrolled pain    Complete by:  As directed      Call MD for:  temperature >100.4    Complete by:  As directed      Diet - low sodium heart healthy    Complete by:  As directed      Increase activity slowly    Complete by:  As directed      May shower / Bathe    Complete by:  As directed      May walk up steps    Complete by:  As directed      No dressing needed    Complete by:  As directed             Medication List         aspirin EC 81 MG tablet  Take 81 mg by mouth every morning.     cholecalciferol 1000 UNITS tablet  Commonly known as:  VITAMIN D  Take 1,000 Units by mouth  every morning.     HYDROcodone-acetaminophen 5-325 MG per tablet  Commonly known as:  NORCO/VICODIN  Take 1-2 tablets by mouth every 4 (four) hours as needed for moderate pain.     LANTUS SOLOSTAR 100 UNIT/ML Solostar Pen  Generic drug:  Insulin Glargine  Inject 31 Units into the skin every morning.     lisinopril 20 MG tablet  Commonly known as:  PRINIVIL,ZESTRIL  Take 20 mg by mouth every morning.     multivitamin with minerals tablet  Take 1 tablet by mouth every morning.     simvastatin 40 MG tablet  Commonly known as:  ZOCOR  Take 40 mg by mouth every morning.     sitaGLIPtin-metformin 50-1000 MG per tablet  Commonly known as:  JANUMET  Take 1 tablet by mouth at bedtime.           Follow-up Information   Follow up with Carylon Perches, MD. Schedule an appointment as soon as possible for a visit in 4 weeks. (Tammy from office will call with appt date/time)    Specialty:  Interventional Radiology   Contact information:   [address] [place] [address] [id number]       Signed: Ascencion Dike PA-C 06/02/2014, 7:47 AM

## 2014-06-02 NOTE — Progress Notes (Signed)
Patient d/c home. Stable.- Kaislyn Gulas RN 

## 2014-06-02 NOTE — Discharge Summary (Signed)
Agree.  Tolerated thermal ablation of liver very well.  Discharge to home today with 4 week clinic follow up with Dr. Anselm Pancoast.

## 2014-06-04 LAB — GLUCOSE, CAPILLARY: Glucose-Capillary: 132 mg/dL — ABNORMAL HIGH (ref 70–99)

## 2014-06-21 ENCOUNTER — Other Ambulatory Visit: Payer: Self-pay | Admitting: Radiology

## 2014-06-21 ENCOUNTER — Other Ambulatory Visit (HOSPITAL_COMMUNITY): Payer: Self-pay | Admitting: Diagnostic Radiology

## 2014-06-21 DIAGNOSIS — C220 Liver cell carcinoma: Secondary | ICD-10-CM

## 2014-06-26 ENCOUNTER — Telehealth: Payer: Self-pay

## 2014-06-26 NOTE — Telephone Encounter (Signed)
Patient called asking for refill of Alprazolam to take before his upcoming MRI.  Phoned in to [facility] (spoke with Sharyn Lull) on [address] [place], Alprazolam 0.5mg  PO, #3.  Take one tablet one hour before MRI.  May repeat x one.  No refills, per Dr. Markus Daft.  Patient aware.  Brita Romp, RN

## 2014-07-10 ENCOUNTER — Ambulatory Visit
Admission: RE | Admit: 2014-07-10 | Discharge: 2014-07-10 | Disposition: A | Discharge status: Home / Self Care | Payer: 59 | Source: Ambulatory Visit | Attending: Radiology | Admitting: Radiology

## 2014-07-10 ENCOUNTER — Ambulatory Visit (HOSPITAL_COMMUNITY)
Admission: RE | Admit: 2014-07-10 | Discharge: 2014-07-10 | Disposition: A | Discharge status: Home / Self Care | Payer: 59 | Source: Ambulatory Visit | Attending: Diagnostic Radiology | Admitting: Diagnostic Radiology

## 2014-07-10 DIAGNOSIS — C220 Liver cell carcinoma: Secondary | ICD-10-CM

## 2014-07-10 DIAGNOSIS — Z08 Encounter for follow-up examination after completed treatment for malignant neoplasm: Secondary | ICD-10-CM | POA: Insufficient documentation

## 2014-07-10 MED ORDER — GADOBENATE DIMEGLUMINE 529 MG/ML IV SOLN
20.0000 mL | Freq: Once | INTRAVENOUS | Status: AC | PRN
  Administered 2014-07-10: 20 mL via INTRAVENOUS

## 2014-07-10 NOTE — Progress Notes (Addendum)
Appetite:  Good.  Denies nausea, vomiting, or diarrhea.  Some abd and back discomfort for the first couple of weeks post procedure.  Took Rx pain meds as needed at that time.  Sleeping:  Good.  Walks daily x 2 miles, outside weather permitting.    Overall, patient feels that he is doing well.  Appointment w/ Roosevelt Locks, NP on 07/17/2014.    Betina Puckett Riki Rusk, RN 07/10/2014 10:41 AM

## 2014-07-10 NOTE — Consult Note (Signed)
Chief Complaint: Follow-up of hepatocellular carcinoma treatment  Referring Physician(s): Bruning,Kevin  History of Present Illness: Aaron [name] is a 68 y.o. male with hepatitis C and hepatocellular carcinoma. The hepatocellular carcinoma was initially treated with transarterial chemoembolization. The lesion was subsequently treated with image guided microwave ablation on 06/01/2014. The patient presents for follow-up.  The patient had pain on the right side of the abdomen for approximately 3 days following the thermal ablation. This pain was treated with oral pain medications.  He has not had any significant pain since. Patient denies fevers or chills. He had some problems with constipation immediately following the procedure but that has resolved. Patient is back to his normal activity level. He has no new complaints. In particular, he denies any abdominal pain.  The patient has started treatment for his hepatitis C.  Past Medical History  Diagnosis Date  . Hypertension   . Diabetes mellitus without complication   . Hyperlipidemia   . Hepatocellular carcinoma 01/30/2014    Path  . Hyperlipidemia   . Hepatitis     hepatitis C    No past surgical history on file.  Allergies: Review of patient's allergies indicates no known allergies.  Medications: Prior to Admission medications   Medication Sig Start Date End Date Taking? Authorizing Provider  aspirin EC 81 MG tablet Take 81 mg by mouth every morning.    Yes Historical Provider, MD  cholecalciferol (VITAMIN D) 1000 UNITS tablet Take 1,000 Units by mouth every morning.    Yes Historical Provider, MD  Insulin Glargine (LANTUS SOLOSTAR) 100 UNIT/ML Solostar Pen Inject 31 Units into the skin every morning.    Yes Historical Provider, MD  lisinopril (PRINIVIL,ZESTRIL) 20 MG tablet Take 20 mg by mouth every morning.    Yes Historical Provider, MD  Multiple Vitamins-Minerals (MULTIVITAMIN WITH MINERALS) tablet Take 1 tablet by mouth  every morning.    Yes Historical Provider, MD  Ombitas-Paritapre-Ritona-Dasab (VIEKIRA PAK) 12.5-75-50 &250 MG TBPK Take by mouth. Take 3 tablets Every AM w/ food.  Take tablet every PM with foold   Yes Historical Provider, MD  ribavirin (REBETOL) 200 MG capsule Take 200 mg by mouth 2 (two) times daily. Take 3 pills every AM and 3 pills every PM   Yes Historical Provider, MD  sitaGLIPtin-metformin (JANUMET) 50-1000 MG per tablet Take 1 tablet by mouth at bedtime.    Yes Historical Provider, MD  HYDROcodone-acetaminophen (NORCO/VICODIN) 5-325 MG per tablet Take 1-2 tablets by mouth every 4 (four) hours as needed for moderate pain. 06/02/14   Ascencion Dike, PA-C  simvastatin (ZOCOR) 40 MG tablet Take 40 mg by mouth every morning.     Historical Provider, MD    No family history on file.  History   Social History  . Marital Status: Married    Spouse Name: N/A    Number of Children: N/A  . Years of Education: N/A   Social History Main Topics  . Smoking status: Former Smoker    Quit date: 01/30/1994  . Smokeless tobacco: Never Used     Comment: stopped 25 yrs ago  . Alcohol Use: Not on file     Comment: stoped 25 yrs ago  . Drug Use: No  . Sexual Activity: Not Currently   Other Topics Concern  . Not on file   Social History Narrative  . No narrative on file     Review of Systems  Constitutional: Negative.   Respiratory: Negative.   Cardiovascular: Negative.   Gastrointestinal:  Positive for constipation.  Genitourinary: Negative.     Vital Signs: BP 141/79 mmHg  Pulse 77  Temp(Src) 97.9 F (36.6 C) (Oral)  Resp 14  SpO2 98%  Physical Exam  Constitutional: He appears well-developed and well-nourished.  Cardiovascular: Normal rate, regular rhythm and normal heart sounds.   Pulmonary/Chest: Effort normal and breath sounds normal.  Abdominal: Soft. Bowel sounds are normal. There is no tenderness.  Ablation puncture sites are well-healed.  No evidence for skin irritation  or breakdown.      Imaging: [name]  07/10/2014   CLINICAL DATA:  Thermal ablation of hepatocellular carcinoma followup  EXAM: MRI ABDOMEN WITHOUT AND WITH CONTRAST  TECHNIQUE: Multiplanar multisequence MR imaging of the abdomen was performed both before and after the administration of intravenous contrast.  CONTRAST:  20mL MULTIHANCE GADOBENATE DIMEGLUMINE 529 MG/ML IV SOLN  COMPARISON:  04/24/2014  FINDINGS: Lower chest:  There is no pleural effusion identified.  Hepatobiliary: Microwave ablation site within segment 6 of the liver is again identified. On today's study this measures 5.6 x 6.0 cm. The thermal ablation defect involves the entire previously demonstrated area of residual nodular enhancement. On today's study there is no focal residual nodular enhancement visualized. On the early arterial phase images there is a mild increase in signal intensity compared with the precontrast images which is favored to represent pseudoenhancement. No new liver lesions identified. The portal vein appears patent without evidence for thrombosis. There is no abnormal perihepatic fluid collections identified. The gallbladder appears normal. No significant biliary dilatation. Normal appearance of the pancreas.  Pancreas: The pancreas appears normal.  Spleen: Normal appearance of the spleen.  Adrenals/Urinary Tract: The adrenal glands are both within normal limits. Unremarkable appearance of both kidneys.  Stomach/Bowel: The stomach is within normal limits. The visualized small bowel loops have a normal course and caliber. No obstruction. Normal appearance of the colon.  Vascular/Lymphatic: Normal appearance of the abdominal aorta. No enlarged upper abdominal lymph nodes.  Other: No free fluid or fluid collections within the upper abdomen.  Musculoskeletal: Normal signal is identified from within the bone marrow.  IMPRESSION: 1. Status post thermal ablation of recurrent apparatus cellular carcinoma in the  right hepatic lobe. No complicating features are identified. There are no specific features identified to suggest residual tumor.   Electronically Signed   By: Kerby Moors M.D.   On: 07/10/2014 12:46    Labs:  CBC:  Recent Labs  03/13/14 0800 03/14/14 0448 05/28/14 0835 06/02/14 0715  WBC 5.9 9.5 4.6 6.5  HGB 13.1 13.4 13.1 12.1*  HCT 40.5 41.8 41.0 37.6*  PLT 229 251 213 191    COAGS:  Recent Labs  01/30/14 1313 03/06/14 0800 03/13/14 0800 05/28/14 0835 06/01/14 0625  INR 1.01 0.96 0.91 1.03 1.03  APTT 30 30  --  32 29    BMP:  Recent Labs  03/13/14 0800 03/14/14 0448 04/16/14 0705 05/28/14 0835 06/02/14 0715  NA 137 136* 138 138 136*  K 4.3 5.2 4.2 4.5 4.7  CL 99 98 102 100 99  CO2 26 24 27 27 25   GLUCOSE 124* 170* 129* 153* 140*  BUN 16 22 13 15 16   CALCIUM 9.6 9.1 9.3 9.3 9.0  CREATININE 1.12 1.06 1.07 0.99 1.02  GFRNONAA 66* 71*  --  83* 74*  GFRAA 77* 82*  --  >90 86*    LIVER FUNCTION TESTS:  Recent Labs  03/14/14 0448 04/16/14 0705 05/28/14 [id number] 06/02/14 0715  BILITOT 0.5 0.6 0.5 0.9  AST 39* 32 46* 423*  ALT 40 32 51 310*  ALKPHOS 45 40 37* 40  PROT 7.8 7.2 7.7 7.1  ALBUMIN 3.5 4.1 3.7 3.5    TUMOR MARKERS: No results for input(s): AFPTM, CEA, CA199, CHROMGRNA in the last 8760 hours.  Assessment and Plan:   68 year old with a solitary hepatocellular carcinoma treated with transarterial chemotherapy and image guided thermal ablation. The MRI from today shows no evidence for residual tumor enhancement in the liver.  No evidence for new liver lesions and no complicating features following the ablation. The previous MRI raised concern for a small satellite lesion in the right hepatic lobe which is not clearly present on today's examination.  In addition, outside labs from 07/05/2014 were reviewed and the patient's liver enzymes were normal.  At this time, the liver lesion has been successfully treated and will require  surveillance.  Patient is scheduled to follow-up with hepatology later this month and continue treatment for hepatitis C. Plan to see the patient back in 6 months with another MRI of the liver.  The patient will contact us with any questions or concerns in the interim.      I spent a total of 15 minutes face to face in clinical consultation, greater than 50% of which was counseling/coordinating care for the hepatocellular carcinoma.  SignedCarylon Perches 07/10/2014, 9:26 PM

## 2014-08-24 HISTORY — PX: ESOPHAGOGASTRODUODENOSCOPY: SHX1529

## 2014-10-15 ENCOUNTER — Other Ambulatory Visit: Payer: Self-pay | Admitting: Nurse Practitioner

## 2014-10-15 DIAGNOSIS — C220 Liver cell carcinoma: Secondary | ICD-10-CM

## 2014-10-31 ENCOUNTER — Ambulatory Visit
Admission: RE | Admit: 2014-10-31 | Discharge: 2014-10-31 | Disposition: A | Discharge status: Home / Self Care | Payer: 59 | Source: Ambulatory Visit | Attending: Nurse Practitioner | Admitting: Nurse Practitioner

## 2014-10-31 DIAGNOSIS — C220 Liver cell carcinoma: Secondary | ICD-10-CM

## 2014-10-31 DIAGNOSIS — R140 Abdominal distension (gaseous): Secondary | ICD-10-CM | POA: Diagnosis not present

## 2014-10-31 MED ORDER — GADOXETATE DISODIUM 0.25 MMOL/ML IV SOLN
9.0000 mL | Freq: Once | INTRAVENOUS | Status: AC | PRN
  Administered 2014-10-31: 9 mL via INTRAVENOUS

## 2014-11-20 ENCOUNTER — Telehealth: Payer: Self-pay | Admitting: Radiology

## 2014-11-20 NOTE — Telephone Encounter (Signed)
Pt had recent MR on 10/31/2014 (ordered by Roosevelt Locks, NP).  Reviewed by Dr Markus Daft.  Additional imaging not needed at present.    Will recheck late Summer re:  Imaging, labs, etc.  Reece Levy, RN 11/20/2014 11:43 AM

## 2014-11-23 DIAGNOSIS — B182 Chronic viral hepatitis C: Secondary | ICD-10-CM | POA: Diagnosis not present

## 2014-12-03 DIAGNOSIS — B182 Chronic viral hepatitis C: Secondary | ICD-10-CM | POA: Diagnosis not present

## 2014-12-03 DIAGNOSIS — K7469 Other cirrhosis of liver: Secondary | ICD-10-CM | POA: Diagnosis not present

## 2014-12-03 DIAGNOSIS — C220 Liver cell carcinoma: Secondary | ICD-10-CM | POA: Diagnosis not present

## 2014-12-05 DIAGNOSIS — E1165 Type 2 diabetes mellitus with hyperglycemia: Secondary | ICD-10-CM | POA: Diagnosis not present

## 2014-12-24 DIAGNOSIS — R300 Dysuria: Secondary | ICD-10-CM | POA: Diagnosis not present

## 2015-01-07 DIAGNOSIS — C229 Malignant neoplasm of liver, not specified as primary or secondary: Secondary | ICD-10-CM | POA: Diagnosis not present

## 2015-01-07 DIAGNOSIS — Z0000 Encounter for general adult medical examination without abnormal findings: Secondary | ICD-10-CM | POA: Diagnosis not present

## 2015-01-07 DIAGNOSIS — I10 Essential (primary) hypertension: Secondary | ICD-10-CM | POA: Diagnosis not present

## 2015-01-10 DIAGNOSIS — E559 Vitamin D deficiency, unspecified: Secondary | ICD-10-CM | POA: Diagnosis not present

## 2015-01-10 DIAGNOSIS — E780 Pure hypercholesterolemia: Secondary | ICD-10-CM | POA: Diagnosis not present

## 2015-01-10 DIAGNOSIS — E6609 Other obesity due to excess calories: Secondary | ICD-10-CM | POA: Diagnosis not present

## 2015-01-10 DIAGNOSIS — I10 Essential (primary) hypertension: Secondary | ICD-10-CM | POA: Diagnosis not present

## 2015-02-14 DIAGNOSIS — I10 Essential (primary) hypertension: Secondary | ICD-10-CM | POA: Diagnosis not present

## 2015-02-14 DIAGNOSIS — B182 Chronic viral hepatitis C: Secondary | ICD-10-CM | POA: Diagnosis not present

## 2015-02-14 DIAGNOSIS — E119 Type 2 diabetes mellitus without complications: Secondary | ICD-10-CM | POA: Diagnosis not present

## 2015-02-18 ENCOUNTER — Other Ambulatory Visit: Payer: Self-pay

## 2015-03-27 ENCOUNTER — Encounter: Payer: Self-pay | Admitting: Radiology

## 2015-03-27 ENCOUNTER — Other Ambulatory Visit (HOSPITAL_COMMUNITY): Payer: Self-pay | Admitting: Diagnostic Radiology

## 2015-03-27 ENCOUNTER — Other Ambulatory Visit (HOSPITAL_COMMUNITY): Payer: Self-pay | Admitting: Interventional Radiology

## 2015-03-27 ENCOUNTER — Other Ambulatory Visit: Payer: Self-pay | Admitting: Radiology

## 2015-03-27 DIAGNOSIS — C220 Liver cell carcinoma: Secondary | ICD-10-CM

## 2015-04-12 LAB — CREATININE WITH EST GFR
Creat: 1.14 mg/dL (ref 0.70–1.25)
GFR, Est African American: 76 mL/min (ref >=60)
GFR, Est Non African American: 66 mL/min (ref >=60)

## 2015-04-12 LAB — BUN: BUN: 18 mg/dL (ref 7–25)

## 2015-04-25 ENCOUNTER — Ambulatory Visit (HOSPITAL_COMMUNITY)
Admission: RE | Admit: 2015-04-25 | Discharge: 2015-04-25 | Disposition: A | Discharge status: Home / Self Care | Payer: 59 | Source: Ambulatory Visit | Attending: Diagnostic Radiology | Admitting: Diagnostic Radiology

## 2015-04-25 ENCOUNTER — Ambulatory Visit
Admission: RE | Admit: 2015-04-25 | Discharge: 2015-04-25 | Disposition: A | Discharge status: Home / Self Care | Payer: 59 | Source: Ambulatory Visit | Attending: Diagnostic Radiology | Admitting: Diagnostic Radiology

## 2015-04-25 DIAGNOSIS — Z9889 Other specified postprocedural states: Secondary | ICD-10-CM | POA: Diagnosis not present

## 2015-04-25 DIAGNOSIS — K7460 Unspecified cirrhosis of liver: Secondary | ICD-10-CM | POA: Insufficient documentation

## 2015-04-25 DIAGNOSIS — C220 Liver cell carcinoma: Secondary | ICD-10-CM

## 2015-04-25 MED ORDER — GADOXETATE DISODIUM 0.25 MMOL/ML IV SOLN
10.0000 mL | Freq: Once | INTRAVENOUS | Status: AC | PRN
  Administered 2015-04-25: 10 mL via INTRAVENOUS

## 2015-04-25 NOTE — Progress Notes (Signed)
Chief Complaint: Patient was seen in consultation today for HCC/ Hep C; previous DEB-TACE/ Microwave ablation Chief Complaint  Patient presents with  . Follow-up    11 mo follow up DEB-TACE     at the request of Dr Anselm Pancoast  Referring Physician(s): Henn,Adam  History of Present Illness: Aaron [name] is a 69 y.o. male   69 year old male with history of hepatitis-C and hepatocellular carcinoma. Patient under transarterial chemoembolization of a right hepatic lesion with drug-eluting beads on 03/06/2014.  Post MRI did reveal smaller lesion amenable to microwave ablation. This was performed 06/01/14. Pt has done well post procedures Follow up MRI 07/10/14 and 10/31/14 continue to reveal no recurrence at ablation site. Pt now in clinic today for MRI and discussion with Dr Laurence Ferrari. MRI again reveal NO recurrence. Pt states he is completely without symptoms of pain or N/V/D. Eating and drinking well. No complaints  Bun/Cr 04/12/15 wnl  Past Medical History  Diagnosis Date  . Hypertension   . Diabetes mellitus without complication   . Hyperlipidemia   . Hepatocellular carcinoma 01/30/2014    Path  . Hyperlipidemia   . Hepatitis     hepatitis C    No past surgical history on file.  Allergies: Review of patient's allergies indicates no known allergies.  Medications: Prior to Admission medications   Medication Sig Start Date End Date Taking? Authorizing Provider  aspirin EC 81 MG tablet Take 81 mg by mouth every morning.    Yes Historical Provider, MD  cholecalciferol (VITAMIN D) 1000 UNITS tablet Take 1,000 Units by mouth every morning.    Yes Historical Provider, MD  HYDROcodone-acetaminophen (NORCO/VICODIN) 5-325 MG per tablet Take 1-2 tablets by mouth every 4 (four) hours as needed for moderate pain. 06/02/14  Yes Ascencion Dike, PA-C  Insulin Glargine (LANTUS SOLOSTAR) 100 UNIT/ML Solostar Pen Inject 31 Units into the skin every morning.    Yes Historical Provider, MD    lisinopril (PRINIVIL,ZESTRIL) 20 MG tablet Take 20 mg by mouth every morning.    Yes Historical Provider, MD  Multiple Vitamins-Minerals (MULTIVITAMIN WITH MINERALS) tablet Take 1 tablet by mouth every morning.    Yes Historical Provider, MD  simvastatin (ZOCOR) 40 MG tablet Take 40 mg by mouth every morning. Taking 1/2 tab (per patient)   Yes Historical Provider, MD  sitaGLIPtin-metformin (JANUMET) 50-1000 MG per tablet Take 1 tablet by mouth at bedtime.    Yes Historical Provider, MD  Ombitas-Paritapre-Ritona-Dasab (VIEKIRA PAK) 12.5-75-50 &250 MG TBPK Take by mouth. Take 3 tablets Every AM w/ food.  Take tablet every PM with foold    Historical Provider, MD  ribavirin (REBETOL) 200 MG capsule Take 200 mg by mouth 2 (two) times daily. Take 3 pills every AM and 3 pills every PM    Historical Provider, MD     No family history on file.  Social History   Social History  . Marital Status: Married    Spouse Name: N/A  . Number of Children: N/A  . Years of Education: N/A   Social History Main Topics  . Smoking status: Former Smoker    Quit date: 01/30/1994  . Smokeless tobacco: Never Used     Comment: stopped 25 yrs ago  . Alcohol Use: Not on file     Comment: stoped 25 yrs ago  . Drug Use: No  . Sexual Activity: Not Currently   Other Topics Concern  . Not on file   Social History Narrative  . No narrative on  file    ECOG Status: 0 - Asymptomatic  Review of Systems: A 12 point ROS discussed and pertinent positives are indicated in the HPI above.  All other systems are negative.  Review of Systems  Constitutional: Negative for fever, activity change and unexpected weight change.  Respiratory: Negative for shortness of breath.   Cardiovascular: Negative for chest pain.  Gastrointestinal: Negative for nausea, vomiting, abdominal pain, diarrhea and constipation.  Musculoskeletal: Negative for back pain.  Neurological: Negative for weakness.  Psychiatric/Behavioral: Negative  for behavioral problems and confusion.    Vital Signs: BP 149/67 mmHg  Pulse 64  Temp(Src) 97.7 F (36.5 C) (Oral)  Resp 14  SpO2 100%  Physical Exam  Constitutional: He is oriented to person, place, and time.  Cardiovascular: Normal rate, regular rhythm and normal heart sounds.   Pulmonary/Chest: Effort normal and breath sounds normal.  Abdominal: Soft. Bowel sounds are normal. There is no tenderness.  Musculoskeletal: Normal range of motion.  Neurological: He is alert and oriented to person, place, and time.  Skin: Skin is warm and dry.  Psychiatric: He has a normal mood and affect. His behavior is normal. Judgment and thought content normal.  Nursing note and vitals reviewed.   Mallampati Score:     Imaging: [name]  04/25/2015   CLINICAL DATA:  69 year old male with history of hepatitis-C and prior history of hepatocellular carcinoma status post multiple ablation therapies.  EXAM: MRI ABDOMEN WITHOUT AND WITH CONTRAST  TECHNIQUE: Multiplanar multisequence MR imaging of the abdomen was performed both before and after the administration of intravenous contrast.  CONTRAST:  10 mL of Eovist.  COMPARISON:  Multiple priors, most recently MRI of the abdomen 10/31/2014.  FINDINGS: Lower chest:  Unremarkable.  Hepatobiliary: Again noted are post ablation changes in the right lobe of the liver centered between segments 5, 6, 7 and 8, best demonstrated on pre gadolinium T1 weighted images where the ablation area measures approximately 3.9 x 3.1 cm (image 45 of series 500), smaller than prior study 10/31/2014 (at which point this area measured 4.3 x 4.6 cm). This area is heterogeneous in signal intensity, but generally appears T2 isointense and slightly T1 hyperintense. Post gadolinium images demonstrate no significant internal or peripheral enhancement associated with this lesion to suggest residual/recurrent disease at this time. Additionally, post gadolinium images demonstrate  no new hypervascular lesions in the hepatic parenchyma. Slight nodular contour of the liver, compatible with underlying cirrhosis. No intra or extrahepatic biliary ductal dilatation. Gallbladder is normal in appearance.  Pancreas: No pancreatic mass. No pancreatic ductal dilatation. No pancreatic or peripancreatic fluid or inflammatory changes.  Spleen: Unremarkable.  Adrenals/Urinary Tract: Bilateral adrenal glands and bilateral kidneys are unremarkable in appearance. No hydroureteronephrosis in the visualized portions of the abdomen.  Stomach/Bowel: Visualized portions are normal in appearance.  Vascular/Lymphatic: No aneurysm identified in the abdominal vasculature. Portal vein is widely. No lymphadenopathy noted in the abdomen. Borderline enlarged celiac axis lymph node measuring 9 mm (image 46 of series 503), similar to prior examinations (nonspecific and presumably benign).  Other: No significant volume of ascites in the visualized peritoneal cavity.  Musculoskeletal: No aggressive osseous lesions noted in the visualized portions of the abdomen.  IMPRESSION: 1. Post procedural changes of prior ablation therapy in the right lobe of the liver, with continued regression of the ablation defect, and no findings to suggest residual/recurrent hepatocellular carcinoma on today's examination. 2. Subtle changes of cirrhosis again noted in the liver. No high new hypervascular  liver lesion to suggest new [place] at this time.   Electronically Signed   By: Vinnie Langton M.D.   On: 04/25/2015 08:14    Labs:  CBC:  Recent Labs  05/28/14 0835 06/02/14 0715  WBC 4.6 6.5  HGB 13.1 12.1*  HCT 41.0 37.6*  PLT 213 191    COAGS:  Recent Labs  05/28/14 0835 06/01/14 0625  INR 1.03 1.03  APTT 32 29    BMP:  Recent Labs  05/28/14 0835 06/02/14 0715 04/12/15 0735  NA 138 136*  --   K 4.5 4.7  --   CL 100 99  --   CO2 27 25  --   GLUCOSE 153* 140*  --   BUN 15 16 18   CALCIUM 9.3 9.0  --     CREATININE 0.99 1.02 1.14  GFRNONAA 83* 74* 66  GFRAA >90 86* 76    LIVER FUNCTION TESTS:  Recent Labs  05/28/14 0835 06/02/14 0715  BILITOT 0.5 0.9  AST 46* 423*  ALT 51 310*  ALKPHOS 37* 40  PROT 7.7 7.1  ALBUMIN 3.7 3.5    TUMOR MARKERS: No results for input(s): AFPTM, CEA, CA199, CHROMGRNA in the last 8760 hours.  Assessment:  Hep C followed by Roosevelt Locks in Hepatitis clinic Hepatocellular Cancer--remission Post DEB-Tace 02/2015 and Microwave Ablation 05/2014 Doing well MRI today reveals NO recurrence at ablation site Plan for 3 mo follow up MRI Pt has been seen by Dr Laurence Ferrari All questions answered to satisfaction  Signed: Letonya Mangels A 04/25/2015, 9:49 AM   Please refer to Dr. Laurence Ferrari attestation of this note for management and plan.

## 2015-06-04 DIAGNOSIS — C220 Liver cell carcinoma: Secondary | ICD-10-CM | POA: Diagnosis not present

## 2015-06-04 DIAGNOSIS — B182 Chronic viral hepatitis C: Secondary | ICD-10-CM | POA: Diagnosis not present

## 2015-06-04 DIAGNOSIS — K7469 Other cirrhosis of liver: Secondary | ICD-10-CM | POA: Diagnosis not present

## 2015-07-05 DIAGNOSIS — E559 Vitamin D deficiency, unspecified: Secondary | ICD-10-CM | POA: Diagnosis not present

## 2015-07-05 DIAGNOSIS — I10 Essential (primary) hypertension: Secondary | ICD-10-CM | POA: Diagnosis not present

## 2015-07-05 DIAGNOSIS — E7800 Pure hypercholesterolemia, unspecified: Secondary | ICD-10-CM | POA: Diagnosis not present

## 2015-07-05 DIAGNOSIS — E119 Type 2 diabetes mellitus without complications: Secondary | ICD-10-CM | POA: Diagnosis not present

## 2015-07-09 ENCOUNTER — Other Ambulatory Visit: Payer: Self-pay | Admitting: Radiology

## 2015-07-09 ENCOUNTER — Other Ambulatory Visit (HOSPITAL_COMMUNITY): Payer: Self-pay | Admitting: Diagnostic Radiology

## 2015-07-09 DIAGNOSIS — C220 Liver cell carcinoma: Secondary | ICD-10-CM

## 2015-07-12 DIAGNOSIS — E119 Type 2 diabetes mellitus without complications: Secondary | ICD-10-CM | POA: Diagnosis not present

## 2015-07-12 DIAGNOSIS — C229 Malignant neoplasm of liver, not specified as primary or secondary: Secondary | ICD-10-CM | POA: Diagnosis not present

## 2015-07-12 DIAGNOSIS — I10 Essential (primary) hypertension: Secondary | ICD-10-CM | POA: Diagnosis not present

## 2015-07-12 DIAGNOSIS — E7800 Pure hypercholesterolemia, unspecified: Secondary | ICD-10-CM | POA: Diagnosis not present

## 2015-07-22 DIAGNOSIS — Z1211 Encounter for screening for malignant neoplasm of colon: Secondary | ICD-10-CM | POA: Diagnosis not present

## 2015-07-22 DIAGNOSIS — C229 Malignant neoplasm of liver, not specified as primary or secondary: Secondary | ICD-10-CM | POA: Diagnosis not present

## 2015-07-22 DIAGNOSIS — D500 Iron deficiency anemia secondary to blood loss (chronic): Secondary | ICD-10-CM | POA: Diagnosis not present

## 2015-07-25 NOTE — Progress Notes (Signed)
Per Aaron Robert, PA-C, Xanax 0.5mg  PO x 2 Rx phoned in to [place] at [facility] for Aaron [name].  He understands to take one of these pills an hour before his MR on 08/06/15 and that he may repeat the dose as needed for his claustrophobia.  He states he does this with every MR so he feels very comfortable with these instructions.  He also states an understanding to have a driver since the Xanax is sedating.  jkl

## 2015-07-30 DIAGNOSIS — K766 Portal hypertension: Secondary | ICD-10-CM | POA: Diagnosis not present

## 2015-07-30 DIAGNOSIS — D123 Benign neoplasm of transverse colon: Secondary | ICD-10-CM | POA: Diagnosis not present

## 2015-07-30 DIAGNOSIS — Z1211 Encounter for screening for malignant neoplasm of colon: Secondary | ICD-10-CM | POA: Diagnosis not present

## 2015-07-30 DIAGNOSIS — D509 Iron deficiency anemia, unspecified: Secondary | ICD-10-CM | POA: Diagnosis not present

## 2015-07-30 DIAGNOSIS — K635 Polyp of colon: Secondary | ICD-10-CM | POA: Diagnosis not present

## 2015-07-30 DIAGNOSIS — K3189 Other diseases of stomach and duodenum: Secondary | ICD-10-CM | POA: Diagnosis not present

## 2015-07-30 DIAGNOSIS — D122 Benign neoplasm of ascending colon: Secondary | ICD-10-CM | POA: Diagnosis not present

## 2015-07-30 DIAGNOSIS — D500 Iron deficiency anemia secondary to blood loss (chronic): Secondary | ICD-10-CM | POA: Diagnosis not present

## 2015-08-01 LAB — CBC
HCT: 40.2 % (ref 39.0–52.0)
Hemoglobin: 12.9 g/dL — ABNORMAL LOW (ref 13.0–17.0)
MCH: 22.1 pg — ABNORMAL LOW (ref 26.0–34.0)
MCHC: 32.1 g/dL (ref 30.0–36.0)
MCV: 68.8 fL — ABNORMAL LOW (ref 78.0–100.0)
MPV: 10.4 fL (ref 8.6–12.4)
Platelets: 254 10*3/uL (ref 150–400)
RBC: 5.84 MIL/uL — ABNORMAL HIGH (ref 4.22–5.81)
RDW: 15 % (ref 11.5–15.5)
WBC: 4.9 10*3/uL (ref 4.0–10.5)

## 2015-08-02 LAB — COMPREHENSIVE METABOLIC PANEL
ALT: 21 U/L (ref 9–46)
AST: 30 U/L (ref 10–35)
Albumin: 4 g/dL (ref 3.6–5.1)
Alkaline Phosphatase: 36 U/L — ABNORMAL LOW (ref 40–115)
BUN: 14 mg/dL (ref 7–25)
CO2: 28 mmol/L (ref 20–31)
Calcium: 9.5 mg/dL (ref 8.6–10.3)
Chloride: 101 mmol/L (ref 98–110)
Creat: 0.98 mg/dL (ref 0.70–1.25)
Glucose, Bld: 121 mg/dL — ABNORMAL HIGH (ref 65–99)
Potassium: 4.4 mmol/L (ref 3.5–5.3)
Sodium: 141 mmol/L (ref 135–146)
Total Bilirubin: 0.4 mg/dL (ref 0.2–1.2)
Total Protein: 7.3 g/dL (ref 6.1–8.1)

## 2015-08-02 LAB — PROTIME-INR
INR: 1.03 (ref <1.50)
Prothrombin Time: 13.6 seconds (ref 11.6–15.2)

## 2015-08-06 ENCOUNTER — Ambulatory Visit (HOSPITAL_COMMUNITY)
Admission: RE | Admit: 2015-08-06 | Discharge: 2015-08-06 | Disposition: A | Discharge status: Home / Self Care | Payer: 59 | Source: Ambulatory Visit | Attending: Diagnostic Radiology | Admitting: Diagnostic Radiology

## 2015-08-06 ENCOUNTER — Ambulatory Visit
Admission: RE | Admit: 2015-08-06 | Discharge: 2015-08-06 | Disposition: A | Discharge status: Home / Self Care | Payer: 59 | Source: Ambulatory Visit | Attending: Diagnostic Radiology | Admitting: Diagnostic Radiology

## 2015-08-06 DIAGNOSIS — K7460 Unspecified cirrhosis of liver: Secondary | ICD-10-CM | POA: Diagnosis not present

## 2015-08-06 DIAGNOSIS — C220 Liver cell carcinoma: Secondary | ICD-10-CM | POA: Diagnosis not present

## 2015-08-06 DIAGNOSIS — B1920 Unspecified viral hepatitis C without hepatic coma: Secondary | ICD-10-CM | POA: Diagnosis not present

## 2015-08-06 MED ORDER — GADOXETATE DISODIUM 0.25 MMOL/ML IV SOLN
10.0000 mL | Freq: Once | INTRAVENOUS | Status: AC | PRN
  Administered 2015-08-06: 10 mL via INTRAVENOUS

## 2015-08-06 NOTE — Progress Notes (Signed)
Referring Physician(s): Henn,Adam   Subjective: Pt here for follow up after MR abdomen. Hx of TACE for [facility] on 03/13/14, and subsequent CT microwave ablation of same lesion on 10/0/15. He has been doing very well. No new c/o groin or abdominal pain Had follow up MRI this morning PMHx, current meds also reviewed.  Allergies: Review of patient's allergies indicates no known allergies.  Medications: Prior to Admission medications   Medication Sig Start Date End Date Taking? Authorizing Provider  aspirin EC 81 MG tablet Take 81 mg by mouth every morning.    Yes Historical Provider, MD  cholecalciferol (VITAMIN D) 1000 UNITS tablet Take 1,000 Units by mouth every morning.    Yes Historical Provider, MD  Insulin Glargine (LANTUS SOLOSTAR) 100 UNIT/ML Solostar Pen Inject 31 Units into the skin every morning.    Yes Historical Provider, MD  lisinopril (PRINIVIL,ZESTRIL) 20 MG tablet Take 20 mg by mouth every morning.    Yes Historical Provider, MD  metFORMIN (GLUCOPHAGE) 1000 MG tablet Take 1,000 mg by mouth 2 (two) times daily with a meal.   Yes Historical Provider, MD  Multiple Vitamins-Minerals (MULTIVITAMIN WITH MINERALS) tablet Take 1 tablet by mouth every morning.    Yes Historical Provider, MD  simvastatin (ZOCOR) 40 MG tablet Take 40 mg by mouth every morning. Taking 1/2 tab (per patient)   Yes Historical Provider, MD  HYDROcodone-acetaminophen (NORCO/VICODIN) 5-325 MG per tablet Take 1-2 tablets by mouth every 4 (four) hours as needed for moderate pain. Patient not taking: Reported on 08/06/2015 06/02/14   Ascencion Dike, PA-C  Ombitas-Paritapre-Ritona-Dasab (VIEKIRA PAK) 12.5-75-50 &250 MG TBPK Take by mouth. Take 3 tablets Every AM w/ food.  Take tablet every PM with foold    Historical Provider, MD  ribavirin (REBETOL) 200 MG capsule Take 200 mg by mouth 2 (two) times daily. Take 3 pills every AM and 3 pills every PM    Historical Provider, MD  sitaGLIPtin-metformin (JANUMET)  50-1000 MG per tablet Take 1 tablet by mouth at bedtime.     Historical Provider, MD     Vital Signs: BP 162/75 mmHg  Pulse 76  Temp(Src) 97.4 F (36.3 C) (Oral)  Resp 14  SpO2 98%  Physical Exam  Constitutional: He is oriented to person, place, and time. He appears well-developed. No distress.  Abdominal: Soft. He exhibits no distension. There is no tenderness.  Neurological: He is alert and oriented to person, place, and time.  Skin: Skin is warm.  Psychiatric: He has a normal mood and affect. Judgment normal.    Imaging: [name]  08/06/2015  CLINICAL DATA:  Hepatitis-C.  Status post multiple ablation. EXAM: MRI ABDOMEN WITHOUT AND WITH CONTRAST TECHNIQUE: Multiplanar multisequence MR imaging of the abdomen was performed both before and after the administration of intravenous contrast. CONTRAST:  10 cc of Eovist. COMPARISON:  04/25/2015 FINDINGS: Lower chest:  There is no pleural fluid identified. Hepatobiliary: Subtle morphologic features a liver compatible with cirrhosis. Ablation defect is identified within the posterior right hepatic lobe T1 hyperintense ablation defect within the posterior right hepatic lobe measures 3.7 x 2.6 cm, image 42 of series 500. This is compared with 4.3 x 3.0 cm previously. Following the IV administration of contrast material there is no significant enhancement within the ablation defect. Pancreas: The pancreas is normal. Spleen: Negative. Adrenals/Urinary Tract: Normal appearance of the adrenal glands. The kidneys are unremarkable. Stomach/Bowel: The stomach is normal. There is no pathologic dilatation of the upper abdominal  bowel loops. Vascular/Lymphatic: Normal appearance of the abdominal aorta. No aneurysm. The portal vein appears patent. No upper abdominal adenopathy identified. Other: No free fluid or fluid collections identified within the upper abdomen. Musculoskeletal: Normal signal from within the bone marrow. IMPRESSION: 1. Slight  decrease in size of ablation defect involving the right lobe of liver. No findings identified to suggest residual/recurrence of hepatocellular carcinoma. 2. Mild changes of cirrhosis. Electronically Signed   By: Kerby Moors M.D.   On: 08/06/2015 08:10    Labs:  CBC:  Recent Labs  08/01/15 0845  WBC 4.9  HGB 12.9*  HCT 40.2  PLT 254    COAGS:  Recent Labs  08/01/15 0845  INR 1.03    BMP:  Recent Labs  04/12/15 0735 08/01/15 0845  NA  --  141  K  --  4.4  CL  --  101  CO2  --  28  GLUCOSE  --  121*  BUN 18 14  CALCIUM  --  9.5  CREATININE 1.14 0.98  GFRNONAA 66  --   GFRAA 76  --     LIVER FUNCTION TESTS:  Recent Labs  08/01/15 0845  BILITOT 0.4  AST 30  ALT 21  ALKPHOS 36*  PROT 7.3  ALBUMIN 4.0    Assessment and Plan: IXL s/p TACE and microwave ablation therapies in 2015. MRI results reviewed and discussed with pt and wife. Good result with interval decrease in size of lesion and no evidence of residual/recurrence LFTs normal Pt doing well, will arrange 6 month MRI and follow up.   SignedAscencion Dike 08/06/2015, 9:20 AM   I spent a total of 15 Minutes at the the patient's bedside AND on the patient's hospital floor or unit, greater than 50% of which was counseling/coordinating care for follow up [facility] treatement

## 2015-10-04 DIAGNOSIS — I10 Essential (primary) hypertension: Secondary | ICD-10-CM | POA: Diagnosis not present

## 2015-10-04 DIAGNOSIS — D649 Anemia, unspecified: Secondary | ICD-10-CM | POA: Diagnosis not present

## 2015-10-04 DIAGNOSIS — E119 Type 2 diabetes mellitus without complications: Secondary | ICD-10-CM | POA: Diagnosis not present

## 2015-10-11 DIAGNOSIS — I10 Essential (primary) hypertension: Secondary | ICD-10-CM | POA: Diagnosis not present

## 2015-10-11 DIAGNOSIS — E119 Type 2 diabetes mellitus without complications: Secondary | ICD-10-CM | POA: Diagnosis not present

## 2015-10-11 DIAGNOSIS — D472 Monoclonal gammopathy: Secondary | ICD-10-CM | POA: Diagnosis not present

## 2015-10-11 DIAGNOSIS — E7800 Pure hypercholesterolemia, unspecified: Secondary | ICD-10-CM | POA: Diagnosis not present

## 2015-10-17 ENCOUNTER — Ambulatory Visit (HOSPITAL_BASED_OUTPATIENT_CLINIC_OR_DEPARTMENT_OTHER): Payer: 59 | Admitting: Hematology

## 2015-10-17 ENCOUNTER — Telehealth: Payer: Self-pay | Admitting: Hematology

## 2015-10-17 ENCOUNTER — Ambulatory Visit (HOSPITAL_BASED_OUTPATIENT_CLINIC_OR_DEPARTMENT_OTHER): Payer: 59

## 2015-10-17 ENCOUNTER — Encounter: Payer: Self-pay | Admitting: Hematology

## 2015-10-17 VITALS — BP 160/75 | HR 63 | Temp 97.8°F | Resp 18 | Ht 72.0 in | Wt 268.0 lb

## 2015-10-17 DIAGNOSIS — D509 Iron deficiency anemia, unspecified: Secondary | ICD-10-CM

## 2015-10-17 DIAGNOSIS — D472 Monoclonal gammopathy: Secondary | ICD-10-CM

## 2015-10-17 DIAGNOSIS — B1920 Unspecified viral hepatitis C without hepatic coma: Secondary | ICD-10-CM

## 2015-10-17 DIAGNOSIS — I10 Essential (primary) hypertension: Secondary | ICD-10-CM | POA: Diagnosis not present

## 2015-10-17 DIAGNOSIS — E119 Type 2 diabetes mellitus without complications: Secondary | ICD-10-CM

## 2015-10-17 DIAGNOSIS — Z8505 Personal history of malignant neoplasm of liver: Secondary | ICD-10-CM

## 2015-10-17 LAB — COMPREHENSIVE METABOLIC PANEL
ALT: 20 U/L (ref 0–55)
AST: 28 U/L (ref 5–34)
Albumin: 4.1 g/dL (ref 3.5–5.0)
Alkaline Phosphatase: 41 U/L (ref 40–150)
Anion Gap: 9 mEq/L (ref 3–11)
BUN: 14.4 mg/dL (ref 7.0–26.0)
CO2: 26 mEq/L (ref 22–29)
Calcium: 9.6 mg/dL (ref 8.4–10.4)
Chloride: 104 mEq/L (ref 98–109)
Creatinine: 1.1 mg/dL (ref 0.7–1.3)
EGFR: 77 mL/min/{1.73_m2} — ABNORMAL LOW (ref >90)
Glucose: 120 mg/dl (ref 70–140)
Potassium: 4.5 mEq/L (ref 3.5–5.1)
Sodium: 138 mEq/L (ref 136–145)
Total Bilirubin: 0.57 mg/dL (ref 0.20–1.20)
Total Protein: 8.3 g/dL (ref 6.4–8.3)

## 2015-10-17 LAB — CBC & DIFF AND RETIC
BASO%: 0.4 % (ref 0.0–2.0)
Basophils Absolute: 0 10*3/uL (ref 0.0–0.1)
EOS%: 1.4 % (ref 0.0–7.0)
Eosinophils Absolute: 0.1 10*3/uL (ref 0.0–0.5)
HCT: 39.7 % (ref 38.4–49.9)
HGB: 12.7 g/dL — ABNORMAL LOW (ref 13.0–17.1)
Immature Retic Fract: 5.7 % (ref 3.00–10.60)
LYMPH%: 25.3 % (ref 14.0–49.0)
MCH: 22.5 pg — ABNORMAL LOW (ref 27.2–33.4)
MCHC: 32 g/dL (ref 32.0–36.0)
MCV: 70.4 fL — ABNORMAL LOW (ref 79.3–98.0)
MONO#: 0.4 10*3/uL (ref 0.1–0.9)
MONO%: 6.8 % (ref 0.0–14.0)
NEUT#: 3.4 10*3/uL (ref 1.5–6.5)
NEUT%: 66.1 % (ref 39.0–75.0)
Platelets: 235 10*3/uL (ref 140–400)
RBC: 5.64 10*6/uL (ref 4.20–5.82)
RDW: 15.5 % — ABNORMAL HIGH (ref 11.0–14.6)
Retic %: 1.15 % (ref 0.80–1.80)
Retic Ct Abs: 64.86 10*3/uL (ref 34.80–93.90)
WBC: 5.2 10*3/uL (ref 4.0–10.3)
lymph#: 1.3 10*3/uL (ref 0.9–3.3)

## 2015-10-17 LAB — IRON AND TIBC
%SAT: 18 % — ABNORMAL LOW (ref 20–55)
Iron: 65 ug/dL (ref 42–163)
TIBC: 350 ug/dL (ref 202–409)
UIBC: 286 ug/dL (ref 117–376)

## 2015-10-17 LAB — LACTATE DEHYDROGENASE: LDH: 177 U/L (ref 125–245)

## 2015-10-17 LAB — FERRITIN: Ferritin: 88 ng/ml (ref 22–316)

## 2015-10-17 NOTE — Telephone Encounter (Signed)
per po fto sch pt appt-gave pt copy of avs-sent back to lab °

## 2015-10-17 NOTE — Progress Notes (Signed)
[name] Kitchen    HEMATOLOGY/ONCOLOGY CONSULTATION NOTE  Date of Service: 10/17/2015  Patient Care Team: Jani Gravel, MD as PCP - General (Internal Medicine)  CHIEF COMPLAINTS/PURPOSE OF CONSULTATION:  MGUS  HISTORY OF PRESENTING ILLNESS:  Aaron [name] is a wonderful 70 y.o. male who has been referred to [place] by Dr Jani Gravel for evaluation and management of monoclonal gammopathy of undetermined significance.  Patient has history of hypertension, diabetes, dyslipidemia, hepatocellular carcinoma (rx with TACE and percutaneous thermal ablation), hepatitis C status post treatment, iron deficiency anemia in 2014 treated with oral iron.  Patient had an SPEP done by his primary care physician on 10/04/2015 that showed an M spike of 0.3 g/dL. It was presumably will be done due to his complaints of fatigue. He has had no significant anemia. No new bone pains. No fevers/chills/drenching night sweats.  No new anemia..  Outside labs show hemoglobin of 12.4 with microcytosis with an MCV of 70.5, normal WBC count of 5.1k.  No evidence of hypercalcemia or significant renal failure on his outside labs.  MEDICAL HISTORY:  Past Medical History  Diagnosis Date  . Hypertension   . Diabetes mellitus without complication   . Hyperlipidemia   . Hepatocellular carcinoma 01/30/2014    Path  . Hyperlipidemia   . Hepatitis     hepatitis C  Obesity Hepatocellular carcinoma treated with TACE and percutaneous thermal ablation by interventional radiology. Last MRI on 08/06/2015 showed slight decrease in the size of the ablation defect involving the right lobe of the liver area and no findings to suggest residual or recurrent hepatocellular carcinoma. Mild changes of liver cirrhosis.  Hepatitis C genotype 1A status post treatment with Viekira and Ribavarin for 24 weeks.  Monoclonal gammopathy of undetermined significance.  SURGICAL HISTORY:  Status post microwave ablation[October 2015] of liver lesion and TACE [July 2015]  for [place]. EGD and colonoscopy in 2016   SOCIAL HISTORY: Social History   Social History  . Marital Status: Married    Spouse Name: N/A  . Number of Children: N/A  . Years of Education: N/A   Occupational History  . Not on file.   Social History Main Topics  . Smoking status: Former Smoker    Quit date: 01/30/1994  . Smokeless tobacco: Never Used     Comment: stopped 25 yrs ago  . Alcohol Use: Not on file     Comment: stoped 25 yrs ago  . Drug Use: No  . Sexual Activity: Not Currently   Other Topics Concern  . Not on file   Social History Narrative  . No narrative on file  Former smoker and smoked 1 pack per day for about 20 years starting at age 61 quit 28 years ago.  FAMILY HISTORY: No family history on file.  ALLERGIES:  has No Known Allergies.  MEDICATIONS:  Current Outpatient Prescriptions  Medication Sig Dispense Refill  . aspirin EC 81 MG tablet Take 81 mg by mouth every morning.     . cholecalciferol (VITAMIN D) 1000 UNITS tablet Take 1,000 Units by mouth every morning.     [name] Kitchen HYDROcodone-acetaminophen (NORCO/VICODIN) 5-325 MG per tablet Take 1-2 tablets by mouth every 4 (four) hours as needed for moderate pain. (Patient not taking: Reported on 08/06/2015) 30 tablet 0  . Insulin Glargine (LANTUS SOLOSTAR) 100 UNIT/ML Solostar Pen Inject 31 Units into the skin every morning.     [name] Kitchen lisinopril (PRINIVIL,ZESTRIL) 20 MG tablet Take 20 mg by mouth every morning.     . metFORMIN (  GLUCOPHAGE) 1000 MG tablet Take 1,000 mg by mouth 2 (two) times daily with a meal.    . Multiple Vitamins-Minerals (MULTIVITAMIN WITH MINERALS) tablet Take 1 tablet by mouth every morning.     . Ombitas-Paritapre-Ritona-Dasab (VIEKIRA PAK) 12.5-75-50 &250 MG TBPK Take by mouth. Take 3 tablets Every AM w/ food.  Take tablet every PM with foold    . ribavirin (REBETOL) 200 MG capsule Take 200 mg by mouth 2 (two) times daily. Take 3 pills every AM and 3 pills every PM    . simvastatin (ZOCOR) 40  MG tablet Take 40 mg by mouth every morning. Taking 1/2 tab (per patient)    . sitaGLIPtin-metformin (JANUMET) 50-1000 MG per tablet Take 1 tablet by mouth at bedtime.      No current facility-administered medications for this visit.    REVIEW OF SYSTEMS:    10 Point review of Systems was done is negative except as noted above.  PHYSICAL EXAMINATION: ECOG PERFORMANCE STATUS: 2 - Symptomatic, <50% confined to bed  . Filed Vitals:   10/17/15 0845  BP: 160/75  Pulse: 63  Temp: 97.8 F (36.6 C)  Resp: 18   Filed Weights   10/17/15 0845  Weight: 268 lb (121.564 kg)   .Body mass index is 36.34 kg/(m^2).  GENERAL:alert, in no acute distress and comfortable SKIN: skin color, texture, turgor are normal, no rashes or significant lesions EYES: normal, conjunctiva are pink and non-injected, sclera clear OROPHARYNX:no exudate, no erythema and lips, buccal mucosa, and tongue normal  NECK: supple, no JVD, thyroid normal size, non-tender, without nodularity LYMPH:  no palpable lymphadenopathy in the cervical, axillary or inguinal LUNGS: clear to auscultation with normal respiratory effort HEART: regular rate & rhythm,  no murmurs and no lower extremity edema ABDOMEN: abdomen soft, non-tender, normoactive bowel sounds , no palpable hepatosplenomegaly. Musculoskeletal: no cyanosis of digits and no clubbing  PSYCH: alert & oriented x 3 with fluent speech NEURO: no focal motor/sensory deficits  LABORATORY DATA:  I have reviewed the data as listed  . CBC Latest Ref Rng 10/17/2015 08/01/2015 06/02/2014  WBC 4.0 - 10.3 10e3/uL 5.2 4.9 6.5  Hemoglobin 13.0 - 17.1 g/dL 12.7(L) 12.9(L) 12.1(L)  Hematocrit 38.4 - 49.9 % 39.7 40.2 37.6(L)  Platelets 140 - 400 10e3/uL 235 254 191   . CBC    Component Value Date/Time   WBC 5.2 10/17/2015 1016   WBC 4.9 08/01/2015 0845   RBC 5.64 10/17/2015 1016   RBC 5.84* 08/01/2015 0845   HGB 12.7* 10/17/2015 1016   HGB 12.9* 08/01/2015 0845   HCT 39.7  10/17/2015 1016   HCT 40.2 08/01/2015 0845   PLT 235 10/17/2015 1016   PLT 254 08/01/2015 0845   MCV 70.4* 10/17/2015 1016   MCV 68.8* 08/01/2015 0845   MCH 22.5* 10/17/2015 1016   MCH 22.1* 08/01/2015 0845   MCHC 32.0 10/17/2015 1016   MCHC 32.1 08/01/2015 0845   RDW 15.5* 10/17/2015 1016   RDW 15.0 08/01/2015 0845   LYMPHSABS 1.3 10/17/2015 1016   LYMPHSABS 1.3 05/28/2014 0835   MONOABS 0.4 10/17/2015 1016   MONOABS 0.6 05/28/2014 0835   EOSABS 0.1 10/17/2015 1016   EOSABS 0.1 05/28/2014 0835   BASOSABS 0.0 10/17/2015 1016   BASOSABS 0.0 05/28/2014 0835    . CMP Latest Ref Rng 10/17/2015 10/17/2015 08/01/2015  Glucose 70 - 140 mg/dl 120 - 121(H)  BUN 7.0 - 26.0 mg/dL 14.4 - 14  Creatinine 0.7 - 1.3 mg/dL 1.1 - 0.98  Sodium 136 - 145 mEq/L 138 - 141  Potassium 3.5 - 5.1 mEq/L 4.5 - 4.4  Chloride 98 - 110 mmol/L - - 101  CO2 22 - 29 mEq/L 26 - 28  Calcium 8.4 - 10.4 mg/dL 9.6 - 9.5  Total Protein 6.0 - 8.5 g/dL 8.3 7.5 7.3  Total Bilirubin 0.20 - 1.20 mg/dL 0.57 - 0.4  Alkaline Phos 40 - 150 U/L 41 - 36(L)  AST 5 - 34 U/L 28 - 30  ALT 0 - 55 U/L 20 - 21   Component     Latest Ref Rng 10/17/2015  IgG (Immunoglobin G), Serum     700 - 1600 mg/dL 1,273  IgA/Immunoglobulin A, Serum     61 - 437 mg/dL 235  IgM (Immunoglobin M), Srm     20 - 172 mg/dL 79  Total Protein     6.0 - 8.5 g/dL 7.5  Albumin SerPl Elph-Mcnc     2.9 - 4.4 g/dL 3.8  Alpha 1     0.0 - 0.4 g/dL 0.2  Alpha2 Glob SerPl Elph-Mcnc     0.4 - 1.0 g/dL 0.9  B-Globulin SerPl Elph-Mcnc     0.7 - 1.3 g/dL 1.2  Gamma Glob SerPl Elph-Mcnc     0.4 - 1.8 g/dL 1.4  M Protein SerPl Elph-Mcnc     Not Observed g/dL 0.2 (H)  Globulin, Total     2.2 - 3.9 g/dL 3.7  Albumin/Glob SerPl     0.7 - 1.7 1.1  IFE 1      Comment  Please Note (HCV):      Comment  Iron     42 - 163 ug/dL 65  TIBC     202 - 409 ug/dL 350  UIBC     117 - 376 ug/dL 286  %SAT     20 - 55 % 18 (L)  Beta 2     0.6 - 2.4 mg/L 2.2    LDH     125 - 245 U/L 177  Sed Rate     0 - 30 mm/hr 21  CRP     0.0 - 4.9 mg/L 2.8  Ferritin     22 - 316 ng/ml 88   IFE 1  Comment   Comments: Immunofixation shows IgG monoclonal protein with kappa light chain  specificity.           RADIOGRAPHIC STUDIES: I have personally reviewed the radiological images as listed and agreed with the findings in the report. Dg Bone Survey Met  10/25/2015  CLINICAL DATA:  History of liver malignancy diagnosed in 2015 with with monoclonal spike, no skeletal complaints EXAM: METASTATIC BONE SURVEY COMPARISON:  None in PACs FINDINGS: Calvarium and spine: Mineralization of the calvarium is normal. No lytic or blastic spinal lesions are observed. There is degenerative disc change in the cervical and thoracic spine. The disc space heights are well maintained in the lumbar spine. Chest: The lungs are adequately inflated. The interstitial markings are coarse. The heart and mediastinal structures are normal. There is no pleural effusion. The observed ribs appear normal. Upper extremities: There is a well-circumscribed approximately 4 x 11 mm lucency in the midshaft of the right radius. There is prominent lucency in the left radial head inferior to the greater tuberosity. Pelvis and lower extremities: The pelvis is adequately mineralized. The hip joint spaces are preserved. No lytic or blastic lower extremity lesions are observed. IMPRESSION: 1. Well-circumscribed lucency in the midshaft of the right radius  suspicious for marrow replacement. Probable lesion in the left humeral head just inferior to the greater tuberosity. 2. Degenerative changes of the cervical and thoracic discs. Electronically Signed   By: David  Martinique M.D.   On: 10/25/2015 08:59    ASSESSMENT & PLAN:   70 year old male with  #1 Monoclonal gammopathy of undetermined significance. M protein 0.2 mg/dL immunofixation showing IgG monoclonal protein with kappa light chain specificity. No overt  anemia.  No overt hypercalcemia or renal failure. Scheduled service shows about circumscribed lucency in the midshaft of the right radius measuring 4 x 11 mm.  There is also a prominent lucency in the left radial head in inferior to the greater tuberosity.  The etiologies of these are unknown but will need to be worked up.  #2 bone lucencies in the right radial mid midshaft and left humeral head. Unclear etiology but concerning in the setting of MGUS to r/o plasmacytoma and r/o metastases from [facility]  #3 Hepatocellular carcinoma treated with TACE and percutaneous thermal ablation by interventional radiology. Last MRI on 08/06/2015 showed slight decrease in the size of the ablation defect involving the right lobe of the liver area and no findings to suggest residual or recurrent hepatocellular carcinoma. Mild changes of liver cirrhosis.  #4 Hepatitis C genotype 1A status post treatment with Viekira and Ribavarin for 24 weeks.  Plan -PET/CT to evaluate for additional bone lesions and better define the visible bone lesions. -MRI left shoulder to better define that lesion and consider biopsy of lesion if appears concerning. -orthopedics referral to evaluate the bone lesions. -Consideration of bone marrow biopsy depending on findings of bone lesions.  #5 Microcytosis with Minimal Anemia - possible thal trait given relative polycythemia hgb 12.7 with 5.6 million RBCs. Cannot r/o an element of iron def thought ferritin wnl at 88 -oral ferrous sulfate '325mg'$  po daily  RTC with Dr Irene Limbo after PET/CT and MRI left shoulder  . Orders Placed This Encounter  Procedures  . DG Bone Survey Met    Standing Status: Future     Number of Occurrences: 1     Standing Expiration Date: 12/16/2016    Order Specific Question:  Reason for Exam (SYMPTOM  OR DIAGNOSIS REQUIRED)    Answer:  staging myeloma    Order Specific Question:  Preferred imaging location?    Answer:  [facility]  . CBC & Diff and Retic     Standing Status: Future     Number of Occurrences: 1     Standing Expiration Date: 11/20/2016  . Comprehensive metabolic panel    Standing Status: Future     Number of Occurrences: 1     Standing Expiration Date: 10/16/2016  . Multiple Myeloma Panel (SPEP&IFE w/QIG)    Standing Status: Future     Number of Occurrences: 1     Standing Expiration Date: 10/16/2016  . Beta 2 microglobulin, serum    Standing Status: Future     Number of Occurrences: 1     Standing Expiration Date: 10/16/2016  . Lactate dehydrogenase (LDH) - [facility]    Standing Status: Future     Number of Occurrences: 1     Standing Expiration Date: 10/16/2016  . Sedimentation rate    Standing Status: Future     Number of Occurrences: 1     Standing Expiration Date: 10/16/2016  . C-reactive protein    Standing Status: Future     Number of Occurrences: 1     Standing Expiration Date: 10/16/2016  .  Ferritin    Standing Status: Future     Number of Occurrences: 1     Standing Expiration Date: 10/16/2016  . Iron and TIBC    Standing Status: Future     Number of Occurrences: 1     Standing Expiration Date: 10/16/2016    All of the patients questions were answered with apparent satisfaction. The patient knows to call the clinic with any problems, questions or concerns.  I spent 60 minutes counseling the patient face to face. The total time spent in the appointment was 60 minutes and more than 50% was on counseling and direct patient cares.    [name] MD [place] AAHIVMS [facility] Hematology/Oncology Physician [facility]  (Office):       [phone] (Work cell):  [phone] (Fax):           [phone]  10/17/2015 9:13 AM

## 2015-10-18 LAB — C-REACTIVE PROTEIN: CRP: 2.8 mg/L (ref 0.0–4.9)

## 2015-10-18 LAB — BETA 2 MICROGLOBULIN, SERUM: Beta-2: 2.2 mg/L (ref 0.6–2.4)

## 2015-10-18 LAB — SEDIMENTATION RATE: Sedimentation Rate-Westergren: 21 mm/hr (ref 0–30)

## 2015-10-21 LAB — MULTIPLE MYELOMA PANEL, SERUM
Albumin SerPl Elph-Mcnc: 3.8 g/dL (ref 2.9–4.4)
Albumin/Glob SerPl: 1.1 (ref 0.7–1.7)
Alpha 1: 0.2 g/dL (ref 0.0–0.4)
Alpha2 Glob SerPl Elph-Mcnc: 0.9 g/dL (ref 0.4–1.0)
B-Globulin SerPl Elph-Mcnc: 1.2 g/dL (ref 0.7–1.3)
Gamma Glob SerPl Elph-Mcnc: 1.4 g/dL (ref 0.4–1.8)
Globulin, Total: 3.7 g/dL (ref 2.2–3.9)
IgA, Qn, Serum: 235 mg/dL (ref 61–437)
IgG, Qn, Serum: 1273 mg/dL (ref 700–1600)
IgM, Qn, Serum: 79 mg/dL (ref 20–172)
M Protein SerPl Elph-Mcnc: 0.2 g/dL — ABNORMAL HIGH
Total Protein: 7.5 g/dL (ref 6.0–8.5)

## 2015-10-25 ENCOUNTER — Ambulatory Visit (HOSPITAL_COMMUNITY)
Admission: RE | Admit: 2015-10-25 | Discharge: 2015-10-25 | Disposition: A | Discharge status: Home / Self Care | Payer: 59 | Source: Ambulatory Visit | Attending: Hematology | Admitting: Hematology

## 2015-10-25 DIAGNOSIS — D472 Monoclonal gammopathy: Secondary | ICD-10-CM | POA: Insufficient documentation

## 2015-10-25 DIAGNOSIS — M5134 Other intervertebral disc degeneration, thoracic region: Secondary | ICD-10-CM | POA: Insufficient documentation

## 2015-10-25 DIAGNOSIS — M5136 Other intervertebral disc degeneration, lumbar region: Secondary | ICD-10-CM | POA: Insufficient documentation

## 2015-10-30 ENCOUNTER — Telehealth: Payer: Self-pay | Admitting: Hematology

## 2015-10-30 NOTE — Telephone Encounter (Signed)
Faxed pt medical records to [facility]. [phone]

## 2015-10-30 NOTE — Telephone Encounter (Signed)
per pof to sch pt appt-cld & spoke to pt and gave pt time & date of appt @ [place] 3/20 @9 -adv [facility] to [facility] and sch scan-gave appt tdate 3/22 @ 9:45

## 2015-11-08 ENCOUNTER — Ambulatory Visit (HOSPITAL_COMMUNITY)
Admission: RE | Admit: 2015-11-08 | Discharge: 2015-11-08 | Disposition: A | Discharge status: Home / Self Care | Payer: 59 | Source: Ambulatory Visit | Attending: Hematology | Admitting: Hematology

## 2015-11-08 ENCOUNTER — Other Ambulatory Visit: Payer: Self-pay | Admitting: Hematology

## 2015-11-08 DIAGNOSIS — C229 Malignant neoplasm of liver, not specified as primary or secondary: Secondary | ICD-10-CM | POA: Diagnosis not present

## 2015-11-08 DIAGNOSIS — J432 Centrilobular emphysema: Secondary | ICD-10-CM | POA: Diagnosis not present

## 2015-11-08 DIAGNOSIS — D472 Monoclonal gammopathy: Secondary | ICD-10-CM

## 2015-11-08 DIAGNOSIS — R590 Localized enlarged lymph nodes: Secondary | ICD-10-CM | POA: Insufficient documentation

## 2015-11-08 LAB — GLUCOSE, CAPILLARY: Glucose-Capillary: 123 mg/dL — ABNORMAL HIGH (ref 65–99)

## 2015-11-08 MED ORDER — FLUDEOXYGLUCOSE F - 18 (FDG) INJECTION
13.4000 | Freq: Once | INTRAVENOUS | Status: AC | PRN
  Administered 2015-11-08: 13.4 via INTRAVENOUS

## 2015-11-11 ENCOUNTER — Other Ambulatory Visit: Payer: Self-pay | Admitting: Hematology

## 2015-11-11 DIAGNOSIS — M7592 Shoulder lesion, unspecified, left shoulder: Secondary | ICD-10-CM

## 2015-11-13 ENCOUNTER — Encounter: Payer: Self-pay | Admitting: Hematology

## 2015-11-13 ENCOUNTER — Other Ambulatory Visit (HOSPITAL_BASED_OUTPATIENT_CLINIC_OR_DEPARTMENT_OTHER): Payer: 59

## 2015-11-13 ENCOUNTER — Other Ambulatory Visit: Payer: Self-pay | Admitting: *Deleted

## 2015-11-13 ENCOUNTER — Ambulatory Visit (HOSPITAL_BASED_OUTPATIENT_CLINIC_OR_DEPARTMENT_OTHER): Payer: 59 | Admitting: Hematology

## 2015-11-13 VITALS — BP 168/75 | HR 70 | Temp 98.1°F | Resp 17 | Ht 72.0 in | Wt 271.6 lb

## 2015-11-13 DIAGNOSIS — D509 Iron deficiency anemia, unspecified: Secondary | ICD-10-CM

## 2015-11-13 DIAGNOSIS — D472 Monoclonal gammopathy: Secondary | ICD-10-CM | POA: Diagnosis not present

## 2015-11-13 DIAGNOSIS — Z8505 Personal history of malignant neoplasm of liver: Secondary | ICD-10-CM | POA: Diagnosis not present

## 2015-11-13 DIAGNOSIS — R718 Other abnormality of red blood cells: Secondary | ICD-10-CM | POA: Insufficient documentation

## 2015-11-13 DIAGNOSIS — B1710 Acute hepatitis C without hepatic coma: Secondary | ICD-10-CM

## 2015-11-13 DIAGNOSIS — M899 Disorder of bone, unspecified: Secondary | ICD-10-CM | POA: Diagnosis not present

## 2015-11-13 LAB — CBC & DIFF AND RETIC
BASO%: 0.4 % (ref 0.0–2.0)
Basophils Absolute: 0 10*3/uL (ref 0.0–0.1)
EOS%: 2.3 % (ref 0.0–7.0)
Eosinophils Absolute: 0.1 10*3/uL (ref 0.0–0.5)
HCT: 38.9 % (ref 38.4–49.9)
HGB: 12.3 g/dL — ABNORMAL LOW (ref 13.0–17.1)
Immature Retic Fract: 9.1 % (ref 3.00–10.60)
LYMPH%: 35.6 % (ref 14.0–49.0)
MCH: 22.4 pg — ABNORMAL LOW (ref 27.2–33.4)
MCHC: 31.6 g/dL — ABNORMAL LOW (ref 32.0–36.0)
MCV: 71 fL — ABNORMAL LOW (ref 79.3–98.0)
MONO#: 0.5 10*3/uL (ref 0.1–0.9)
MONO%: 11.3 % (ref 0.0–14.0)
NEUT#: 2.4 10*3/uL (ref 1.5–6.5)
NEUT%: 50.4 % (ref 39.0–75.0)
Platelets: 220 10*3/uL (ref 140–400)
RBC: 5.48 10*6/uL (ref 4.20–5.82)
RDW: 15.2 % — ABNORMAL HIGH (ref 11.0–14.6)
Retic %: 1.08 % (ref 0.80–1.80)
Retic Ct Abs: 59.18 10*3/uL (ref 34.80–93.90)
WBC: 4.8 10*3/uL (ref 4.0–10.3)
lymph#: 1.7 10*3/uL (ref 0.9–3.3)

## 2015-11-13 LAB — COMPREHENSIVE METABOLIC PANEL
ALT: 19 U/L (ref 0–55)
AST: 26 U/L (ref 5–34)
Albumin: 3.9 g/dL (ref 3.5–5.0)
Alkaline Phosphatase: 37 U/L — ABNORMAL LOW (ref 40–150)
Anion Gap: 9 mEq/L (ref 3–11)
BUN: 11.5 mg/dL (ref 7.0–26.0)
CO2: 29 mEq/L (ref 22–29)
Calcium: 9.6 mg/dL (ref 8.4–10.4)
Chloride: 103 mEq/L (ref 98–109)
Creatinine: 1.1 mg/dL (ref 0.7–1.3)
EGFR: 76 mL/min/{1.73_m2} — ABNORMAL LOW (ref >90)
Glucose: 128 mg/dl (ref 70–140)
Potassium: 4.3 mEq/L (ref 3.5–5.1)
Sodium: 141 mEq/L (ref 136–145)
Total Bilirubin: 0.64 mg/dL (ref 0.20–1.20)
Total Protein: 7.7 g/dL (ref 6.4–8.3)

## 2015-11-13 MED ORDER — POLYSACCHARIDE IRON COMPLEX 150 MG PO CAPS: 150.0000 mg | ORAL_CAPSULE | Freq: Every day | ORAL | Status: DC

## 2015-11-14 ENCOUNTER — Ambulatory Visit (HOSPITAL_COMMUNITY): Payer: 59

## 2015-11-14 NOTE — Progress Notes (Signed)
[name] Kitchen    HEMATOLOGY/ONCOLOGY CLINIC NOTE  Date of Service: 11/14/2015  Patient Care Team: Jani Gravel, MD as PCP - General (Internal Medicine)  CHIEF COMPLAINTS/PURPOSE OF CONSULTATION:  MGUS  HISTORY OF PRESENTING ILLNESS:  Aaron [name] is a wonderful 70 y.o. male who has been referred to [place] by Dr Jani Gravel for evaluation and management of monoclonal gammopathy of undetermined significance.  Patient has history of hypertension, diabetes, dyslipidemia, hepatocellular carcinoma (rx with TACE and percutaneous thermal ablation), hepatitis C status post treatment, iron deficiency anemia in 2014 treated with oral iron.  Patient had an SPEP done by his primary care physician on 10/04/2015 that showed an M spike of 0.3 g/dL. It was presumably will be done due to his complaints of fatigue. He has had no significant anemia. No new bone pains. No fevers/chills/drenching night sweats.  No new anemia..  Outside labs show hemoglobin of 12.4 with microcytosis with an MCV of 70.5, normal WBC count of 5.1k.  No evidence of hypercalcemia or significant renal failure on his outside labs.   INTERVAL HISTORY    MEDICAL HISTORY:  Past Medical History  Diagnosis Date  . Hypertension   . Diabetes mellitus without complication ([place])   . Hyperlipidemia   . Hepatocellular carcinoma ([place]) 01/30/2014    Path  . Hyperlipidemia   . Hepatitis     hepatitis C  Obesity Hepatocellular carcinoma treated with TACE and percutaneous thermal ablation by interventional radiology. Last MRI on 08/06/2015 showed slight decrease in the size of the ablation defect involving the right lobe of the liver area and no findings to suggest residual or recurrent hepatocellular carcinoma. Mild changes of liver cirrhosis.  Hepatitis C genotype 1A status post treatment with Viekira and Ribavarin for 24 weeks.  Monoclonal gammopathy of undetermined significance.  SURGICAL HISTORY:  Status post microwave ablation[October 2015] of  liver lesion and TACE [July 2015] for [place]. EGD and colonoscopy in 2016   SOCIAL HISTORY: Social History   Social History  . Marital Status: Married    Spouse Name: N/A  . Number of Children: N/A  . Years of Education: N/A   Occupational History  . Not on file.   Social History Main Topics  . Smoking status: Former Smoker    Quit date: 01/30/1994  . Smokeless tobacco: Never Used     Comment: stopped 25 yrs ago  . Alcohol Use: Not on file     Comment: stoped 25 yrs ago  . Drug Use: No  . Sexual Activity: Not Currently   Other Topics Concern  . Not on file   Social History Narrative  Former smoker and smoked 1 pack per day for about 20 years starting at age 60 quit 67 years ago.  FAMILY HISTORY: History reviewed. No pertinent family history.  ALLERGIES:  has No Known Allergies.  MEDICATIONS:  Current Outpatient Prescriptions  Medication Sig Dispense Refill  . aspirin EC 81 MG tablet Take 81 mg by mouth every morning.     . cholecalciferol (VITAMIN D) 1000 UNITS tablet Take 1,000 Units by mouth every morning.     . Insulin Glargine (LANTUS SOLOSTAR) 100 UNIT/ML Solostar Pen Inject 31 Units into the skin every morning.     . iron polysaccharides (NIFEREX) 150 MG capsule Take 1 capsule (150 mg total) by mouth daily. 30 capsule 5  . lisinopril (PRINIVIL,ZESTRIL) 20 MG tablet Take 20 mg by mouth every morning.     . metFORMIN (GLUCOPHAGE) 1000 MG tablet Take 1,000 mg by  mouth 2 (two) times daily with a meal.    . simvastatin (ZOCOR) 40 MG tablet Take 40 mg by mouth every morning. Taking 1/2 tab (per patient)     No current facility-administered medications for this visit.    REVIEW OF SYSTEMS:    10 Point review of Systems was done is negative except as noted above.  PHYSICAL EXAMINATION: ECOG PERFORMANCE STATUS: 2 - Symptomatic, <50% confined to bed  . Filed Vitals:   11/13/15 1010 11/13/15 1011  BP: 189/83 168/75  Pulse: 70   Temp: 98.1 F (36.7 C)   Resp:  17    Filed Weights   11/13/15 1010  Weight: 271 lb 9.6 oz (123.197 kg)   .Body mass index is 36.83 kg/(m^2).  GENERAL:alert, in no acute distress and comfortable SKIN: skin color, texture, turgor are normal, no rashes or significant lesions EYES: normal, conjunctiva are pink and non-injected, sclera clear OROPHARYNX:no exudate, no erythema and lips, buccal mucosa, and tongue normal  NECK: supple, no JVD, thyroid normal size, non-tender, without nodularity LYMPH:  no palpable lymphadenopathy in the cervical, axillary or inguinal LUNGS: clear to auscultation with normal respiratory effort HEART: regular rate & rhythm,  no murmurs and no lower extremity edema ABDOMEN: abdomen soft, non-tender, normoactive bowel sounds , no palpable hepatosplenomegaly. Musculoskeletal: no cyanosis of digits and no clubbing  PSYCH: alert & oriented x 3 with fluent speech NEURO: no focal motor/sensory deficits  LABORATORY DATA:  I have reviewed the data as listed  . CBC Latest Ref Rng 11/13/2015 10/17/2015 08/01/2015  WBC 4.0 - 10.3 10e3/uL 4.8 5.2 4.9  Hemoglobin 13.0 - 17.1 g/dL 12.3(L) 12.7(L) 12.9(L)  Hematocrit 38.4 - 49.9 % 38.9 39.7 40.2  Platelets 140 - 400 10e3/uL 220 235 254   . CBC    Component Value Date/Time   WBC 4.8 11/13/2015 0949   WBC 4.9 08/01/2015 0845   RBC 5.48 11/13/2015 0949   RBC 5.84* 08/01/2015 0845   HGB 12.3* 11/13/2015 0949   HGB 12.9* 08/01/2015 0845   HCT 38.9 11/13/2015 0949   HCT 40.2 08/01/2015 0845   PLT 220 11/13/2015 0949   PLT 254 08/01/2015 0845   MCV 71.0* 11/13/2015 0949   MCV 68.8* 08/01/2015 0845   MCH 22.4* 11/13/2015 0949   MCH 22.1* 08/01/2015 0845   MCHC 31.6* 11/13/2015 0949   MCHC 32.1 08/01/2015 0845   RDW 15.2* 11/13/2015 0949   RDW 15.0 08/01/2015 0845   LYMPHSABS 1.7 11/13/2015 0949   LYMPHSABS 1.3 05/28/2014 0835   MONOABS 0.5 11/13/2015 0949   MONOABS 0.6 05/28/2014 0835   EOSABS 0.1 11/13/2015 0949   EOSABS 0.1 05/28/2014 0835     BASOSABS 0.0 11/13/2015 0949   BASOSABS 0.0 05/28/2014 0835    . CMP Latest Ref Rng 11/13/2015 10/17/2015 10/17/2015  Glucose 70 - 140 mg/dl 128 120 -  BUN 7.0 - 26.0 mg/dL 11.5 14.4 -  Creatinine 0.7 - 1.3 mg/dL 1.1 1.1 -  Sodium 136 - 145 mEq/L 141 138 -  Potassium 3.5 - 5.1 mEq/L 4.3 4.5 -  Chloride 98 - 110 mmol/L - - -  CO2 22 - 29 mEq/L 29 26 -  Calcium 8.4 - 10.4 mg/dL 9.6 9.6 -  Total Protein 6.4 - 8.3 g/dL 7.7 8.3 7.5  Total Bilirubin 0.20 - 1.20 mg/dL 0.64 0.57 -  Alkaline Phos 40 - 150 U/L 37(L) 41 -  AST 5 - 34 U/L 26 28 -  ALT 0 - 55 U/L 19 20 -  Component     Latest Ref Rng 10/17/2015  IgG (Immunoglobin G), Serum     700 - 1600 mg/dL 1,273  IgA/Immunoglobulin A, Serum     61 - 437 mg/dL 235  IgM (Immunoglobin M), Srm     20 - 172 mg/dL 79  Total Protein     6.0 - 8.5 g/dL 7.5  Albumin SerPl Elph-Mcnc     2.9 - 4.4 g/dL 3.8  Alpha 1     0.0 - 0.4 g/dL 0.2  Alpha2 Glob SerPl Elph-Mcnc     0.4 - 1.0 g/dL 0.9  B-Globulin SerPl Elph-Mcnc     0.7 - 1.3 g/dL 1.2  Gamma Glob SerPl Elph-Mcnc     0.4 - 1.8 g/dL 1.4  M Protein SerPl Elph-Mcnc     Not Observed g/dL 0.2 (H)  Globulin, Total     2.2 - 3.9 g/dL 3.7  Albumin/Glob SerPl     0.7 - 1.7 1.1  IFE 1      Comment  Please Note (HCV):      Comment  Iron     42 - 163 ug/dL 65  TIBC     202 - 409 ug/dL 350  UIBC     117 - 376 ug/dL 286  %SAT     20 - 55 % 18 (L)  Beta 2     0.6 - 2.4 mg/L 2.2  LDH     125 - 245 U/L 177  Sed Rate     0 - 30 mm/hr 21  CRP     0.0 - 4.9 mg/L 2.8  Ferritin     22 - 316 ng/ml 88   IFE 1  Comment   Comments: Immunofixation shows IgG monoclonal protein with kappa light chain  specificity.           RADIOGRAPHIC STUDIES: I have personally reviewed the radiological images as listed and agreed with the findings in the report. Nm Pet Image Initial (pi) Skull Base To Thigh  11/08/2015  CLINICAL DATA:  Initial treatment strategy for monoclonal gammopathy.  Hepatocellular carcinoma with bone lesions. Diabetes. EXAM: NUCLEAR MEDICINE PET SKULL BASE TO THIGH TECHNIQUE: 13.4 mCi F-18 FDG was injected intravenously. Full-ring PET imaging was performed from the skull base to thigh after the radiotracer. CT data was obtained and used for attenuation correction and anatomic localization. FASTING BLOOD GLUCOSE:  Value: 123 mg/dl COMPARISON:  Skeletal survey of 10/25/2015. Abdominal MRI of 08/06/2015. No prior PET. FINDINGS: Mild degradation secondary to patient body habitus. NECK No areas of abnormal hypermetabolism. CHEST Mild hypermetabolism corresponding to a right axillary node. This measures 6 mm and a S.U.V. max of 2.5 on image 38/ series 4. Maintains its fatty hilum, suggesting a benign etiology. AP window node measures 7 mm and a S.U.V. max of 5.0 on image 39/series 4. Slightly greater than the surrounding mediastinal blood pool. ABDOMEN/PELVIS No areas of abnormal hypermetabolism. No hypermetabolism at the site of posterior right hepatic ablation. SKELETON No abnormal marrow activity. CT IMAGES PERFORMED FOR ATTENUATION CORRECTION No cervical adenopathy.  Centrilobular and paraseptal emphysema. Mild prostatomegaly. Lipoma within the left thigh musculature. Degenerative partial fusion of the bilateral sacroiliac joints. IMPRESSION: 1. Mild and borderline hypermetabolism corresponding to small AP window and right axillary nodes respectively. Favored to be reactive. Otherwise, no evidence of hypermetabolic soft tissue malignancy or metastasis. 2. Right-sided hepatic ablation defect, without hypermetabolism to suggest residual or recurrent disease. Please note that PET is of low sensitivity for hepatocellular carcinoma.  Electronically Signed   By: Abigail Miyamoto M.D.   On: 11/08/2015 14:49   Dg Bone Survey Met  10/25/2015  CLINICAL DATA:  History of liver malignancy diagnosed in 2015 with with monoclonal spike, no skeletal complaints EXAM: METASTATIC BONE SURVEY  COMPARISON:  None in PACs FINDINGS: Calvarium and spine: Mineralization of the calvarium is normal. No lytic or blastic spinal lesions are observed. There is degenerative disc change in the cervical and thoracic spine. The disc space heights are well maintained in the lumbar spine. Chest: The lungs are adequately inflated. The interstitial markings are coarse. The heart and mediastinal structures are normal. There is no pleural effusion. The observed ribs appear normal. Upper extremities: There is a well-circumscribed approximately 4 x 11 mm lucency in the midshaft of the right radius. There is prominent lucency in the left radial head inferior to the greater tuberosity. Pelvis and lower extremities: The pelvis is adequately mineralized. The hip joint spaces are preserved. No lytic or blastic lower extremity lesions are observed. IMPRESSION: 1. Well-circumscribed lucency in the midshaft of the right radius suspicious for marrow replacement. Probable lesion in the left humeral head just inferior to the greater tuberosity. 2. Degenerative changes of the cervical and thoracic discs. Electronically Signed   By: David  Martinique M.D.   On: 10/25/2015 08:59    ASSESSMENT & PLAN:   70 year old male with  #1 Monoclonal gammopathy of undetermined significance. M protein 0.2 mg/dL immunofixation showing IgG monoclonal protein with kappa light chain specificity. No overt anemia.  No overt hypercalcemia or renal failure. Skeletal survey showed about circumscribed lucency in the midshaft of the right radius measuring 4 x 11 mm.  There is also a prominent lucency in the left humeral head inferior to the greater tuberosity.  The etiologies of these are unknown but will need to be worked up.  #2 bone lucencies in the right radial mid midshaft and left humeral head. Unclear etiology but concerning in the setting of MGUS to r/o plasmacytoma and r/o metastases from [facility]. PET/CT did not show any hypermetabolic bone lesions.  Unable to get MRI left shoulder due to size limitations Plan -has been scheduled for MRI left shoulder on different MRI machine that can better accommodate him. -has been given an orthopedics referral to evaluate this bone lesions as well. -would rpt myeloma markers in 6 months for low risk MGUS  #3 Hepatocellular carcinoma treated with TACE and percutaneous thermal ablation by interventional radiology. Last MRI on 08/06/2015 showed slight decrease in the size of the ablation defect involving the right lobe of the liver area and no findings to suggest residual or recurrent hepatocellular carcinoma. Mild changes of liver cirrhosis.  #4 Hepatitis C genotype 1A status post treatment with Viekira and Ribavarin for 24 weeks.  Plan -continue f/u with IR for ongoing management of [place]. PET/CT showed no overt evidence of active disease  #5 Microcytosis with Minimal Anemia - possible thal trait given relative polycythemia hgb 12.7 with 5.6 million RBCs. Some element of iron defi Iron sat 18%, ferritin 88 but could be over estimate in the setting of some liver injury Plan -PO iron supplementation Niferex '150mg'$  po daily  Continue f/u with PCP Orthopedics evaluation  We will watch out for the MRI left shoulder  RTC with Dr Irene Limbo in 6 months with cbc, cmp, SPEP. Earlier if any concerning noted on MRI left shoulder.  . Orders Placed This Encounter  Procedures  . CBC & Diff and Retic    Standing  Status: Future     Number of Occurrences:      Standing Expiration Date: 12/17/2016  . Comprehensive metabolic panel    Standing Status: Future     Number of Occurrences:      Standing Expiration Date: 11/12/2016  . Multiple Myeloma Panel (SPEP&IFE w/QIG)    Standing Status: Future     Number of Occurrences:      Standing Expiration Date: 11/12/2016    All of the patients questions were answered with apparent satisfaction. The patient knows to call the clinic with any problems, questions or  concerns.  I spent 15 minutes counseling the patient face to face. The total time spent in the appointment was 20 minutes and more than 50% was on counseling and direct patient cares.    [name] MD [place] AAHIVMS [facility] Hematology/Oncology Physician [facility]  (Office):       [phone] (Work cell):  [phone] (Fax):           [phone]

## 2015-11-18 DIAGNOSIS — M84822 Other disorders of continuity of bone, left humerus: Secondary | ICD-10-CM | POA: Diagnosis not present

## 2015-11-18 DIAGNOSIS — C229 Malignant neoplasm of liver, not specified as primary or secondary: Secondary | ICD-10-CM | POA: Diagnosis not present

## 2015-11-21 ENCOUNTER — Other Ambulatory Visit: Payer: Self-pay | Admitting: Hematology

## 2015-11-21 DIAGNOSIS — M7592 Shoulder lesion, unspecified, left shoulder: Secondary | ICD-10-CM

## 2015-11-22 ENCOUNTER — Ambulatory Visit (HOSPITAL_COMMUNITY)
Admission: RE | Admit: 2015-11-22 | Discharge: 2015-11-22 | Disposition: A | Discharge status: Home / Self Care | Payer: 59 | Source: Ambulatory Visit | Attending: Hematology | Admitting: Hematology

## 2015-11-22 DIAGNOSIS — M7592 Shoulder lesion, unspecified, left shoulder: Secondary | ICD-10-CM | POA: Insufficient documentation

## 2015-11-22 DIAGNOSIS — M19012 Primary osteoarthritis, left shoulder: Secondary | ICD-10-CM | POA: Insufficient documentation

## 2015-11-22 MED ORDER — GADOBENATE DIMEGLUMINE 529 MG/ML IV SOLN
20.0000 mL | Freq: Once | INTRAVENOUS | Status: AC | PRN
  Administered 2015-11-22: 20 mL via INTRAVENOUS

## 2015-12-04 DIAGNOSIS — C220 Liver cell carcinoma: Secondary | ICD-10-CM | POA: Diagnosis not present

## 2015-12-04 DIAGNOSIS — B182 Chronic viral hepatitis C: Secondary | ICD-10-CM | POA: Diagnosis not present

## 2015-12-04 DIAGNOSIS — K7469 Other cirrhosis of liver: Secondary | ICD-10-CM | POA: Diagnosis not present

## 2016-01-01 ENCOUNTER — Other Ambulatory Visit (HOSPITAL_COMMUNITY): Payer: Self-pay | Admitting: Diagnostic Radiology

## 2016-01-01 ENCOUNTER — Other Ambulatory Visit: Payer: Self-pay | Admitting: Radiology

## 2016-01-01 DIAGNOSIS — C220 Liver cell carcinoma: Secondary | ICD-10-CM

## 2016-01-01 MED ORDER — ALPRAZOLAM 0.5 MG PO TABS: 0.5000 mg | ORAL_TABLET | Freq: Once | ORAL | Status: AC

## 2016-01-14 LAB — COMPREHENSIVE METABOLIC PANEL
ALT: 16 U/L (ref 9–46)
AST: 20 U/L (ref 10–35)
Albumin: 4.2 g/dL (ref 3.6–5.1)
Alkaline Phosphatase: 34 U/L — ABNORMAL LOW (ref 40–115)
BUN: 10 mg/dL (ref 7–25)
CO2: 25 mmol/L (ref 20–31)
Calcium: 9.2 mg/dL (ref 8.6–10.3)
Chloride: 99 mmol/L (ref 98–110)
Creat: 1.06 mg/dL (ref 0.70–1.25)
Glucose, Bld: 141 mg/dL — ABNORMAL HIGH (ref 65–99)
Potassium: 4.4 mmol/L (ref 3.5–5.3)
Sodium: 138 mmol/L (ref 135–146)
Total Bilirubin: 0.6 mg/dL (ref 0.2–1.2)
Total Protein: 7 g/dL (ref 6.1–8.1)

## 2016-01-14 LAB — AFP TUMOR MARKER: AFP-Tumor Marker: 2.6 ng/mL (ref <6.1)

## 2016-01-16 ENCOUNTER — Ambulatory Visit
Admission: RE | Admit: 2016-01-16 | Discharge: 2016-01-16 | Disposition: A | Discharge status: Home / Self Care | Payer: 59 | Source: Ambulatory Visit | Attending: Diagnostic Radiology | Admitting: Diagnostic Radiology

## 2016-01-16 ENCOUNTER — Ambulatory Visit (HOSPITAL_COMMUNITY)
Admission: RE | Admit: 2016-01-16 | Discharge: 2016-01-16 | Disposition: A | Discharge status: Home / Self Care | Payer: 59 | Source: Ambulatory Visit | Attending: Diagnostic Radiology | Admitting: Diagnostic Radiology

## 2016-01-16 DIAGNOSIS — C220 Liver cell carcinoma: Secondary | ICD-10-CM | POA: Diagnosis present

## 2016-01-16 HISTORY — PX: IR GENERIC HISTORICAL: IMG1180011

## 2016-01-16 MED ORDER — GADOXETATE DISODIUM 0.25 MMOL/ML IV SOLN
5.0000 mL | Freq: Once | INTRAVENOUS | Status: AC | PRN
  Administered 2016-01-16: 10 mL via INTRAVENOUS

## 2016-01-16 NOTE — Progress Notes (Signed)
Patient ID: Aaron [name], male   DOB: 05/16/1946, 70 y.o.   MRN: [id number]   Referring Physician(s): Henn,Adam  Chief Complaint: The patient is seen in follow up today s/p DEB-TACE and microwave ablation of hepatocellular carcinoma  History of present illness:  This is a 70 year old male with a history of hepatocellular carcinoma secondary likely to a history of hepatitis C and cirrhosis. He underwent a DEB-TACE and microwave ablation procedures in July and October 2015. He presents today for his 2 year follow-up. He had a repeat MRI this morning prior to his appointment. Unfortunately this MRI reveals a new satellite lesion along the superior aspect of the ablation site. This is either new or more conspicuous today for a recurrence of hepatocellular carcinoma. The patient otherwise has no complaints. He denies any abdominal pain or pain elsewhere. He denies any fevers, chills, chest pain, shortness of breath, dysuria, or mobility issues.  He is currently being followed by Dr. Irene Limbo for the possibility of MGUS. This is in the early stages of workup and will need further workup.  Past Medical History  Diagnosis Date  . Hypertension   . Diabetes mellitus without complication ([place])   . Hyperlipidemia   . Hepatocellular carcinoma ([place]) 01/30/2014    Path  . Hyperlipidemia   . Hepatitis     hepatitis C    No past surgical history on file.  Allergies: Review of patient's allergies indicates no known allergies.  Medications: Prior to Admission medications   Medication Sig Start Date End Date Taking? Authorizing Provider  ALPRAZolam Duanne Moron) 0.5 MG tablet Take 1 tablet (0.5 mg total) by mouth once. 01/01/16 12/31/16 Yes Markus Daft, MD  aspirin EC 81 MG tablet Take 81 mg by mouth every morning.    Yes Historical Provider, MD  cholecalciferol (VITAMIN D) 1000 UNITS tablet Take 1,000 Units by mouth every morning.    Yes Historical Provider, MD  Insulin Glargine (LANTUS SOLOSTAR) 100 UNIT/ML Solostar  Pen Inject 31 Units into the skin every morning.    Yes Historical Provider, MD  iron polysaccharides (NIFEREX) 150 MG capsule Take 1 capsule (150 mg total) by mouth daily. 11/13/15  Yes Brunetta Genera, MD  lisinopril (PRINIVIL,ZESTRIL) 20 MG tablet Take 20 mg by mouth every morning.    Yes Historical Provider, MD  metFORMIN (GLUCOPHAGE) 1000 MG tablet Take 1,000 mg by mouth 2 (two) times daily with a meal.   Yes Historical Provider, MD  simvastatin (ZOCOR) 40 MG tablet Take 40 mg by mouth every morning. Taking 1/2 tab (per patient)   Yes Historical Provider, MD     No family history on file.  Social History   Social History  . Marital Status: Married    Spouse Name: N/A  . Number of Children: N/A  . Years of Education: N/A   Social History Main Topics  . Smoking status: Former Smoker    Quit date: 01/30/1994  . Smokeless tobacco: Never Used     Comment: stopped 25 yrs ago  . Alcohol Use: Not on file     Comment: stoped 25 yrs ago  . Drug Use: No  . Sexual Activity: Not Currently   Other Topics Concern  . Not on file   Social History Narrative     Vital Signs: BP 162/84 mmHg  Pulse 74  Temp(Src) 98.1 F (36.7 C) (Oral)  Resp 14  Ht 6' (1.829 m)  Wt 268 lb (121.564 kg)  BMI 36.34 kg/m2  SpO2 98%  Physical Exam  General: pleasant, obese black male who is sitting in his chair in no acute distress Heart: regular, rate, and rhythm.  Normal s1,s2. No obvious murmurs, gallops, or rubs noted.  Palpable radial and pedal pulses bilaterally Lungs: CTAB, no wheezes, rhonchi, or rales noted.  Respiratory effort nonlabored Abd: soft, NT, ND, obese, +BS, no masses, hernias, or organomegaly MS: all 4 extremities are symmetrical with no cyanosis, clubbing, or edema. Psych: A&Ox3 with an appropriate affect.   Imaging: [name]  01/16/2016  CLINICAL DATA:  Hepatitis-C. Prior RIGHT hepatic lobe ablation of hepatocellular carcinoma EXAM: MRI ABDOMEN WITHOUT AND  WITH CONTRAST TECHNIQUE: Multiplanar multisequence MR imaging of the abdomen was performed both before and after the administration of intravenous contrast. CONTRAST:  10 mL Eovist COMPARISON:  PET-CT 3/17/ 17, 08/06/2015 FINDINGS: Lower chest:  Lung bases are clear. Hepatobiliary: Subcapsular lesion in the lateral RIGHT hepatic lobe measures 3.5 by 2.8 cm compared to 3.7 x 2.6 cm (image 44, series 500) no significant change. Lesion continues demonstrate inherent T1 shortening on the noncontrast T1 weighted imaging. There is no enhancement on the post-contrast series visually or by subtraction imaging. However, on the delayed 20 minutes imaging, there is a new round hyperintense lesion measuring 13 mm (image 37, series 2) just superior to the dominant lesion. The there is mild enhancement of this lesion (image 35, series 501). No additional lesions are present. No biliary duct dilatation. Gallbladder normal. Common bile duct normal. Pancreas: Normal pancreatic parenchymal intensity. No ductal dilatation or inflammation. Spleen: Normal spleen. Adrenals/urinary tract: Adrenal glands and kidneys are normal. Stomach/Bowel: Stomach and limited of the small bowel is unremarkable Vascular/Lymphatic: Abdominal aortic normal caliber. No retroperitoneal periportal lymphadenopathy. Musculoskeletal: No aggressive osseous lesion IMPRESSION: 1. New small 1.3 cm lesion just superior to the ablation site is most consistent with hepatocellular carcinoma recurrence. 2. No evidence of active [place] within the ablation site. Electronically Signed   By: Suzy Bouchard M.D.   On: 01/16/2016 09:29    Labs:  CBC:  Recent Labs  08/01/15 0845 10/17/15 1016 11/13/15 0949  WBC 4.9 5.2 4.8  HGB 12.9* 12.7* 12.3*  HCT 40.2 39.7 38.9  PLT 254 235 220    COAGS:  Recent Labs  08/01/15 0845  INR 1.03    BMP:  Recent Labs  04/12/15 0735 08/01/15 0845 10/17/15 1016 11/13/15 0950 01/13/16 0735  NA  --  141 138 141 138    K  --  4.4 4.5 4.3 4.4  CL  --  101  --   --  99  CO2  --  28 26 29 25   GLUCOSE  --  121* 120 128 141*  BUN 18 14 14.4 11.5 10  CALCIUM  --  9.5 9.6 9.6 9.2  CREATININE 1.14 0.98 1.1 1.1 1.06  GFRNONAA 66  --   --   --   --   GFRAA 76  --   --   --   --     LIVER FUNCTION TESTS:  Recent Labs  08/01/15 0845 10/17/15 1016 11/13/15 0950 01/13/16 0735  BILITOT 0.4 0.57 0.64 0.6  AST 30 28 26 20   ALT 21 20 19 16   ALKPHOS 36* 41 37* 34*  PROT 7.3 8.3  7.5 7.7 7.0  ALBUMIN 4.0 4.1 3.9 4.2    Assessment:  1. Hepatocellular carcinoma, status post DEB-TACE and microwave ablation in 2015  The patient's MRI this morning and suggested possible recurrence of his hepatocellular carcinoma. This  of course, cannot be confirmed definitively, but is highly suspicious.  The liver clinic has confirmed that he is hepatitis free, but given his history of cirrhosis, he is unfortunately at higher risk for a new or recurrent HCC. After a lengthy discussion, it is recommended that we proceed with microwave ablation to this new lesion giving it is only slightly larger than 1 cm at this time, as opposed to waiting.  The patient is agreeable with this. He overall seems stable with his health and should not need any prior clearance.  Signed: Xareni Kelch E 01/16/2016, 11:01 AM   Please refer to Dr. Moises Blood attestation of this note for management and plan.

## 2016-01-17 ENCOUNTER — Other Ambulatory Visit: Payer: Self-pay | Admitting: Diagnostic Radiology

## 2016-01-17 DIAGNOSIS — C220 Liver cell carcinoma: Secondary | ICD-10-CM

## 2016-01-24 ENCOUNTER — Other Ambulatory Visit (HOSPITAL_COMMUNITY): Payer: Self-pay | Admitting: Diagnostic Radiology

## 2016-01-25 ENCOUNTER — Other Ambulatory Visit: Payer: Self-pay | Admitting: Radiology

## 2016-01-27 NOTE — Patient Instructions (Addendum)
Aaron [name]  01/27/2016   Your procedure is scheduled on: 01/31/2016    Report to [facility] Main  Entrance take [facility]  elevators to 3rd floor to  [place] at     1000 AM.  Call this number if you have problems the morning of surgery [phone]   Remember: ONLY 1 PERSON MAY GO WITH YOU TO SHORT STAY TO GET  READY MORNING OF [place].  Do not eat food or drink liquids :After Midnight.                Eat a good healthy snack prior to bedtime.   Take these medicines the morning of surgery with A SIP OF WATER: none  DO NOT TAKE ANY DIABETIC MEDICATIONS DAY OF YOUR SURGERY                               You may not have any metal on your body including hair pins and              piercings  Do not wear jewelry, , lotions, powders or perfumes, deodorant              Men may shave face and neck.   Do not bring valuables to the hospital. [place].  Contacts, dentures or bridgework may not be worn into surgery.  Leave suitcase in the car. After surgery it may be brought to your room.        Special Instructions: coughing and deep breathing exercises, leg exercises               Please read over the following fact sheets you were given: _____________________________________________________________________             [facility] - Preparing for Surgery Before surgery, you can play an important role.  Because skin is not sterile, your skin needs to be as free of germs as possible.  You can reduce the number of germs on your skin by washing with CHG (chlorahexidine gluconate) soap before surgery.  CHG is an antiseptic cleaner which kills germs and bonds with the skin to continue killing germs even after washing. Please DO NOT use if you have an allergy to CHG or antibacterial soaps.  If your skin becomes reddened/irritated stop using the CHG and inform your nurse when you arrive at Short Stay. Do not  shave (including legs and underarms) for at least 48 hours prior to the first CHG shower.  You may shave your face/neck. Please follow these instructions carefully:  1.  Shower with CHG Soap the night before surgery and the  morning of Surgery.  2.  If you choose to wash your hair, wash your hair first as usual with your  normal  shampoo.  3.  After you shampoo, rinse your hair and body thoroughly to remove the  shampoo.                           4.  Use CHG as you would any other liquid soap.  You can apply chg directly  to the skin and wash  Gently with a scrungie or clean washcloth.  5.  Apply the CHG Soap to your body ONLY FROM THE NECK DOWN.   Do not use on face/ open                           Wound or open sores. Avoid contact with eyes, ears mouth and genitals (private parts).                       Wash face,  Genitals (private parts) with your normal soap.             6.  Wash thoroughly, paying special attention to the area where your surgery  will be performed.  7.  Thoroughly rinse your body with warm water from the neck down.  8.  DO NOT shower/wash with your normal soap after using and rinsing off  the CHG Soap.                9.  Pat yourself dry with a clean towel.            10.  Wear clean pajamas.            11.  Place clean sheets on your bed the night of your first shower and do not  sleep with pets. Day of Surgery : Do not apply any lotions/deodorants the morning of surgery.  Please wear clean clothes to the hospital/surgery center.  FAILURE TO FOLLOW THESE INSTRUCTIONS MAY RESULT IN THE CANCELLATION OF YOUR SURGERY PATIENT SIGNATURE_________________________________  NURSE SIGNATURE__________________________________  ________________________________________________________________________

## 2016-01-28 NOTE — Anesthesia Preprocedure Evaluation (Addendum)
Anesthesia Evaluation  Patient identified by MRN, date of birth, ID band Patient awake    Reviewed: Allergy & Precautions, H&P , NPO status , Patient's Chart, lab work & pertinent test results  Airway Mallampati: II  TM Distance: >3 FB Neck ROM: full    Dental  (+) Edentulous Upper, Edentulous Lower, Dental Advisory Given   Pulmonary neg pulmonary ROS, former smoker (quit 1995),    Pulmonary exam normal breath sounds clear to auscultation       Cardiovascular Exercise Tolerance: Good hypertension, Pt. on medications Normal cardiovascular exam Rhythm:regular Rate:Normal     Neuro/Psych Anxiety negative neurological ROS  negative psych ROS   GI/Hepatic negative GI ROS, (+) Hepatitis -, CLiver cancer   Endo/Other  diabetes, Well Controlled, Type 2, Insulin Dependent  Renal/GU negative Renal ROS  negative genitourinary   Musculoskeletal   Abdominal   Peds  Hematology negative hematology ROS (+)   Anesthesia Other Findings   Reproductive/Obstetrics negative OB ROS                            Anesthesia Physical Anesthesia Plan  ASA: III  Anesthesia Plan: General   Post-op Pain Management:    Induction: Intravenous  Airway Management Planned: Oral ETT  Additional Equipment:   Intra-op Plan:   Post-operative Plan: Extubation in OR  Informed Consent: I have reviewed the patients History and Physical, chart, labs and discussed the procedure including the risks, benefits and alternatives for the proposed anesthesia with the patient or authorized representative who has indicated his/her understanding and acceptance.     Plan Discussed with:   Anesthesia Plan Comments: (MAC 4 8 ETT last ablation, check am labs)        Anesthesia Quick Evaluation

## 2016-01-29 ENCOUNTER — Encounter (HOSPITAL_COMMUNITY): Payer: Self-pay

## 2016-01-29 ENCOUNTER — Other Ambulatory Visit: Payer: Self-pay | Admitting: Radiology

## 2016-01-29 ENCOUNTER — Ambulatory Visit (HOSPITAL_COMMUNITY)
Admission: RE | Admit: 2016-01-29 | Discharge: 2016-01-29 | Disposition: A | Discharge status: Home / Self Care | Payer: 59 | Source: Ambulatory Visit | Attending: Interventional Radiology | Admitting: Interventional Radiology

## 2016-01-29 ENCOUNTER — Encounter (HOSPITAL_COMMUNITY)
Admission: RE | Admit: 2016-01-29 | Discharge: 2016-01-29 | Disposition: A | Discharge status: Home / Self Care | Payer: 59 | Source: Ambulatory Visit | Attending: Interventional Radiology | Admitting: Interventional Radiology

## 2016-01-29 DIAGNOSIS — Z01818 Encounter for other preprocedural examination: Secondary | ICD-10-CM

## 2016-01-29 HISTORY — DX: Unspecified osteoarthritis, unspecified site: M19.90

## 2016-01-29 LAB — CBC WITH DIFFERENTIAL/PLATELET
Basophils Absolute: 0.1 K/uL (ref 0.0–0.1)
Basophils Relative: 1 %
Eosinophils Absolute: 0.1 K/uL (ref 0.0–0.7)
Eosinophils Relative: 2 %
HCT: 41.1 % (ref 39.0–52.0)
Hemoglobin: 13.2 g/dL (ref 13.0–17.0)
Lymphocytes Relative: 36 %
Lymphs Abs: 1.8 K/uL (ref 0.7–4.0)
MCH: 22.3 pg — ABNORMAL LOW (ref 26.0–34.0)
MCHC: 32.1 g/dL (ref 30.0–36.0)
MCV: 69.4 fL — ABNORMAL LOW (ref 78.0–100.0)
Monocytes Absolute: 0.4 K/uL (ref 0.1–1.0)
Monocytes Relative: 8 %
Neutro Abs: 2.6 K/uL (ref 1.7–7.7)
Neutrophils Relative %: 53 %
Platelets: 254 K/uL (ref 150–400)
RBC: 5.92 MIL/uL — ABNORMAL HIGH (ref 4.22–5.81)
RDW: 14.7 % (ref 11.5–15.5)
WBC: 5 K/uL (ref 4.0–10.5)

## 2016-01-29 LAB — APTT: aPTT: 34 seconds (ref 24–37)

## 2016-01-29 LAB — COMPREHENSIVE METABOLIC PANEL
ALT: 28 U/L (ref 17–63)
AST: 32 U/L (ref 15–41)
Albumin: 4.7 g/dL (ref 3.5–5.0)
Alkaline Phosphatase: 35 U/L — ABNORMAL LOW (ref 38–126)
Anion gap: 8 (ref 5–15)
BUN: 24 mg/dL — ABNORMAL HIGH (ref 6–20)
CO2: 26 mmol/L (ref 22–32)
Calcium: 9.7 mg/dL (ref 8.9–10.3)
Chloride: 104 mmol/L (ref 101–111)
Creatinine, Ser: 1.25 mg/dL — ABNORMAL HIGH (ref 0.61–1.24)
GFR calc Af Amer: >60 mL/min (ref >60)
GFR calc non Af Amer: 57 mL/min — ABNORMAL LOW (ref >60)
Glucose, Bld: 132 mg/dL — ABNORMAL HIGH (ref 65–99)
Potassium: 4.9 mmol/L (ref 3.5–5.1)
Sodium: 138 mmol/L (ref 135–145)
Total Bilirubin: 0.8 mg/dL (ref 0.3–1.2)
Total Protein: 8.4 g/dL — ABNORMAL HIGH (ref 6.5–8.1)

## 2016-01-29 LAB — PROTIME-INR
INR: 0.98 (ref 0.00–1.49)
Prothrombin Time: 13.2 seconds (ref 11.6–15.2)

## 2016-01-29 NOTE — Progress Notes (Signed)
Spoke with Anesthesia ( Dr Tresa Moore) .  Dr Tresa Moore has viewed chart in EPIc.  Will notify him of any abnormalities with EKG or CXR per prequest of Dr Tresa Moore.

## 2016-01-29 NOTE — Progress Notes (Signed)
CMP done 01/29/2016 routed via EPIC to Dr Kathlene Cote.

## 2016-01-30 ENCOUNTER — Other Ambulatory Visit: Payer: Self-pay | Admitting: General Surgery

## 2016-01-30 LAB — HEMOGLOBIN A1C
Hgb A1c MFr Bld: 7.5 % — ABNORMAL HIGH (ref 4.8–5.6)
Mean Plasma Glucose: 169 mg/dL

## 2016-01-30 NOTE — Progress Notes (Signed)
Final EKG done 01/29/16- EPIC

## 2016-01-31 ENCOUNTER — Encounter (HOSPITAL_COMMUNITY): Payer: Self-pay

## 2016-01-31 ENCOUNTER — Ambulatory Visit (HOSPITAL_COMMUNITY): Payer: 59 | Admitting: Anesthesiology

## 2016-01-31 ENCOUNTER — Observation Stay (HOSPITAL_COMMUNITY)
Admission: RE | Admit: 2016-01-31 | Discharge: 2016-02-01 | Disposition: A | Discharge status: Home / Self Care | Payer: 59 | Source: Ambulatory Visit | Attending: Interventional Radiology | Admitting: Interventional Radiology

## 2016-01-31 ENCOUNTER — Ambulatory Visit (HOSPITAL_COMMUNITY)
Admission: RE | Admit: 2016-01-31 | Discharge: 2016-01-31 | Disposition: A | Discharge status: Home / Self Care | Payer: 59 | Source: Ambulatory Visit | Attending: Diagnostic Radiology | Admitting: Diagnostic Radiology

## 2016-01-31 ENCOUNTER — Encounter (HOSPITAL_COMMUNITY)
Admission: RE | Disposition: A | Discharge status: Home / Self Care | Payer: Self-pay | Source: Ambulatory Visit | Attending: Interventional Radiology

## 2016-01-31 DIAGNOSIS — E119 Type 2 diabetes mellitus without complications: Secondary | ICD-10-CM | POA: Insufficient documentation

## 2016-01-31 DIAGNOSIS — Z794 Long term (current) use of insulin: Secondary | ICD-10-CM | POA: Insufficient documentation

## 2016-01-31 DIAGNOSIS — C220 Liver cell carcinoma: Secondary | ICD-10-CM | POA: Diagnosis present

## 2016-01-31 DIAGNOSIS — Z7982 Long term (current) use of aspirin: Secondary | ICD-10-CM | POA: Diagnosis not present

## 2016-01-31 DIAGNOSIS — K7689 Other specified diseases of liver: Secondary | ICD-10-CM | POA: Diagnosis not present

## 2016-01-31 DIAGNOSIS — Z87891 Personal history of nicotine dependence: Secondary | ICD-10-CM | POA: Insufficient documentation

## 2016-01-31 DIAGNOSIS — Z7984 Long term (current) use of oral hypoglycemic drugs: Secondary | ICD-10-CM | POA: Insufficient documentation

## 2016-01-31 DIAGNOSIS — I10 Essential (primary) hypertension: Secondary | ICD-10-CM | POA: Diagnosis not present

## 2016-01-31 LAB — GLUCOSE, CAPILLARY
Glucose-Capillary: 128 mg/dL — ABNORMAL HIGH (ref 65–99)
Glucose-Capillary: 194 mg/dL — ABNORMAL HIGH (ref 65–99)
Glucose-Capillary: 180 mg/dL — ABNORMAL HIGH (ref 65–99)

## 2016-01-31 LAB — TYPE AND SCREEN
ABO/RH(D): B POS
Antibody Screen: NEGATIVE

## 2016-01-31 SURGERY — RADIOLOGY WITH ANESTHESIA
Anesthesia: General

## 2016-01-31 MED ORDER — HYDRALAZINE HCL 20 MG/ML IJ SOLN
INTRAMUSCULAR | Status: AC
  Filled 2016-01-31: qty 1

## 2016-01-31 MED ORDER — HYDROCODONE-ACETAMINOPHEN 5-325 MG PO TABS: 1.0000 | ORAL_TABLET | ORAL | Status: DC | PRN

## 2016-01-31 MED ORDER — ROCURONIUM BROMIDE 100 MG/10ML IV SOLN
INTRAVENOUS | Status: AC
  Filled 2016-01-31: qty 1

## 2016-01-31 MED ORDER — FENTANYL CITRATE (PF) 250 MCG/5ML IJ SOLN
INTRAMUSCULAR | Status: AC
  Filled 2016-01-31: qty 5

## 2016-01-31 MED ORDER — ONDANSETRON HCL 4 MG/2ML IJ SOLN: 4.0000 mg | Freq: Four times a day (QID) | INTRAMUSCULAR | Status: DC | PRN

## 2016-01-31 MED ORDER — DOCUSATE SODIUM 100 MG PO CAPS
100.0000 mg | ORAL_CAPSULE | Freq: Two times a day (BID) | ORAL | Status: DC
  Administered 2016-02-01: 100 mg via ORAL
  Filled 2016-01-31 (×4): qty 1

## 2016-01-31 MED ORDER — PHENYLEPHRINE HCL 10 MG/ML IJ SOLN
INTRAMUSCULAR | Status: DC | PRN
  Administered 2016-01-31 (×4): 80 ug via INTRAVENOUS

## 2016-01-31 MED ORDER — HYDRALAZINE HCL 20 MG/ML IJ SOLN
5.0000 mg | Freq: Once | INTRAMUSCULAR | Status: AC
  Administered 2016-01-31: 5 mg via INTRAVENOUS

## 2016-01-31 MED ORDER — SODIUM CHLORIDE 0.9 % IV SOLN: Freq: Once | INTRAVENOUS | Status: DC

## 2016-01-31 MED ORDER — PIPERACILLIN-TAZOBACTAM 3.375 G IVPB
3.3750 g | Freq: Once | INTRAVENOUS | Status: AC
  Administered 2016-01-31: 3.375 g via INTRAVENOUS
  Filled 2016-01-31: qty 50

## 2016-01-31 MED ORDER — ONDANSETRON HCL 4 MG/2ML IJ SOLN
INTRAMUSCULAR | Status: DC | PRN
  Administered 2016-01-31: 4 mg via INTRAVENOUS

## 2016-01-31 MED ORDER — LACTATED RINGERS IV SOLN
INTRAVENOUS | Status: DC
  Administered 2016-01-31 (×2): via INTRAVENOUS

## 2016-01-31 MED ORDER — SENNOSIDES-DOCUSATE SODIUM 8.6-50 MG PO TABS
1.0000 | ORAL_TABLET | Freq: Every day | ORAL | Status: DC | PRN
  Filled 2016-01-31: qty 1

## 2016-01-31 MED ORDER — SODIUM CHLORIDE 0.9 % IV SOLN
INTRAVENOUS | Status: DC
  Administered 2016-01-31: 16:00:00 via INTRAVENOUS

## 2016-01-31 MED ORDER — MIDAZOLAM HCL 2 MG/2ML IJ SOLN
INTRAMUSCULAR | Status: AC
  Filled 2016-01-31: qty 2

## 2016-01-31 MED ORDER — LIDOCAINE HCL (CARDIAC) 20 MG/ML IV SOLN
INTRAVENOUS | Status: AC
  Filled 2016-01-31: qty 5

## 2016-01-31 MED ORDER — FENTANYL CITRATE (PF) 100 MCG/2ML IJ SOLN
INTRAMUSCULAR | Status: DC | PRN
  Administered 2016-01-31 (×6): 50 ug via INTRAVENOUS

## 2016-01-31 MED ORDER — PROPOFOL 10 MG/ML IV BOLUS
INTRAVENOUS | Status: AC
  Filled 2016-01-31: qty 20

## 2016-01-31 MED ORDER — ROCURONIUM BROMIDE 100 MG/10ML IV SOLN
INTRAVENOUS | Status: DC | PRN
  Administered 2016-01-31 (×2): 20 mg via INTRAVENOUS
  Administered 2016-01-31: 45 mg via INTRAVENOUS
  Administered 2016-01-31: 5 mg via INTRAVENOUS
  Administered 2016-01-31: 10 mg via INTRAVENOUS

## 2016-01-31 MED ORDER — PROPOFOL 10 MG/ML IV BOLUS
INTRAVENOUS | Status: DC | PRN
  Administered 2016-01-31: 180 mg via INTRAVENOUS

## 2016-01-31 MED ORDER — MEPERIDINE HCL 50 MG/ML IJ SOLN: 6.2500 mg | INTRAMUSCULAR | Status: DC | PRN

## 2016-01-31 MED ORDER — SUCCINYLCHOLINE CHLORIDE 20 MG/ML IJ SOLN
INTRAMUSCULAR | Status: DC | PRN
  Administered 2016-01-31: 100 mg via INTRAVENOUS

## 2016-01-31 MED ORDER — LISINOPRIL 20 MG PO TABS
20.0000 mg | ORAL_TABLET | Freq: Every morning | ORAL | Status: DC
  Administered 2016-01-31 – 2016-02-01 (×2): 20 mg via ORAL
  Filled 2016-01-31 (×3): qty 1

## 2016-01-31 MED ORDER — SUGAMMADEX SODIUM 500 MG/5ML IV SOLN
INTRAVENOUS | Status: DC | PRN
  Administered 2016-01-31: 250 mg via INTRAVENOUS

## 2016-01-31 MED ORDER — HYDROMORPHONE HCL 1 MG/ML IJ SOLN
1.0000 mg | INTRAMUSCULAR | Status: DC | PRN
  Administered 2016-01-31: 1 mg via INTRAVENOUS
  Filled 2016-01-31: qty 1

## 2016-01-31 MED ORDER — LIDOCAINE HCL (CARDIAC) 20 MG/ML IV SOLN
INTRAVENOUS | Status: DC | PRN
  Administered 2016-01-31: 50 mg via INTRAVENOUS

## 2016-01-31 MED ORDER — IOPAMIDOL (ISOVUE-370) INJECTION 76%
100.0000 mL | Freq: Once | INTRAVENOUS | Status: AC | PRN
  Administered 2016-01-31: 75 mL via INTRAVENOUS

## 2016-01-31 MED ORDER — PROMETHAZINE HCL 25 MG/ML IJ SOLN: 6.2500 mg | INTRAMUSCULAR | Status: DC | PRN

## 2016-01-31 MED ORDER — MIDAZOLAM HCL 5 MG/5ML IJ SOLN
INTRAMUSCULAR | Status: DC | PRN
  Administered 2016-01-31: 2 mg via INTRAVENOUS

## 2016-01-31 MED ORDER — FENTANYL CITRATE (PF) 100 MCG/2ML IJ SOLN: 25.0000 ug | INTRAMUSCULAR | Status: DC | PRN

## 2016-01-31 MED ORDER — INSULIN ASPART 100 UNIT/ML ~~LOC~~ SOLN
0.0000 [IU] | Freq: Three times a day (TID) | SUBCUTANEOUS | Status: DC
  Administered 2016-01-31 – 2016-02-01 (×2): 3 [IU] via SUBCUTANEOUS

## 2016-01-31 NOTE — Anesthesia Procedure Notes (Signed)
Procedure Name: Intubation Date/Time: 01/31/2016 12:03 PM Performed by: Noralyn Pick D Pre-anesthesia Checklist: Patient identified, Emergency Drugs available, Suction available and Patient being monitored Patient Re-evaluated:Patient Re-evaluated prior to inductionOxygen Delivery Method: Circle system utilized Preoxygenation: Pre-oxygenation with 100% oxygen Intubation Type: IV induction Ventilation: Mask ventilation without difficulty Laryngoscope Size: Mac and 4 Grade View: Grade II Tube type: Oral Tube size: 7.5 mm Number of attempts: 1 Airway Equipment and Method: Stylet Placement Confirmation: ETT inserted through vocal cords under direct vision,  positive ETCO2 and breath sounds checked- equal and bilateral Secured at: 22 cm Tube secured with: Tape Dental Injury: Teeth and Oropharynx as per pre-operative assessment

## 2016-01-31 NOTE — Anesthesia Postprocedure Evaluation (Signed)
Anesthesia Post Note  Patient: Aaron [name]  Procedure(s) Performed: Procedure(s) (LRB): MICROWAVE ABLATION-LIVER (N/A)  Patient location during evaluation: PACU Anesthesia Type: General Level of consciousness: awake and alert Pain management: pain level controlled Vital Signs Assessment: post-procedure vital signs reviewed and stable Respiratory status: spontaneous breathing, nonlabored ventilation, respiratory function stable and patient connected to nasal cannula oxygen Cardiovascular status: blood pressure returned to baseline and stable Postop Assessment: no signs of nausea or vomiting Anesthetic complications: no    Last Vitals:  Filed Vitals:   01/31/16 1445 01/31/16 1500  BP: 174/76 189/77  Pulse: 56 61  Temp: 36.7 C   Resp: 12 15    Last Pain:  Filed Vitals:   01/31/16 1512  PainSc: 0-No pain                 Alexis Frock

## 2016-01-31 NOTE — H&P (Signed)
Patient ID: Aaron [name], male   DOB: 09-Feb-1946, 70 y.o.   MRN: [id number]    Referring Physician(s): Drazek,Dawn,NP/Pang,R  Supervising Physician: Aletta Edouard  Patient Status: OP TBA  Chief Complaint: Hepatocellular carcinoma   Subjective: Patient familiar to IR service from prior right hepatic lobe hepatocellular carcinoma DEB-TACE in July 2015 followed by right hepatic lobe [place] thermal ablation in October 2015. Recent follow-up MRI abdomen on 01/16/16 revealed a new small 1.3 cm lesion just superior to the ablation site worrisome for [facility] recurrence. He presents again today following discussion with Dr. Augusto Garbe. Kathlene Cote for CT-guided thermal ablation of this new right hepatic lobe liver lesion. He currently denies fever, headache, chest pain, dyspnea, cough, abdominal/back pain, nausea, vomiting or abnormal bleeding. Additional medical history as below. Past Medical History  Diagnosis Date  . Hypertension   . Diabetes mellitus without complication ([place])   . Hyperlipidemia   . Hepatocellular carcinoma ([place]) 01/30/2014    Path  . Hyperlipidemia   . Hepatitis     hepatitis C  . Arthritis   . Anemia     hx of    Past Surgical History  Procedure Laterality Date  . Colonoscopy    . Polypectomy        Allergies: Review of patient's allergies indicates no known allergies.  Medications: Prior to Admission medications   Medication Sig Start Date End Date Taking? Authorizing Provider  ALPRAZolam Duanne Moron) 0.5 MG tablet Take 1 tablet (0.5 mg total) by mouth once. Patient taking differently: Take 0.5 mg by mouth once as needed (Take before MRI procedures.).  01/01/16 12/31/16 Yes Markus Daft, MD  aspirin EC 81 MG tablet Take 81 mg by mouth every morning.    Yes Historical Provider, MD  cholecalciferol (VITAMIN D) 1000 UNITS tablet Take 1,000 Units by mouth every morning.    Yes Historical Provider, MD  Insulin Glargine (LANTUS SOLOSTAR) 100 UNIT/ML Solostar Pen Inject 31 Units into  the skin every morning.    Yes Historical Provider, MD  insulin lispro (HUMALOG) 100 UNIT/ML injection Inject 3 Units into the skin daily before supper.   Yes Historical Provider, MD  lisinopril (PRINIVIL,ZESTRIL) 20 MG tablet Take 20 mg by mouth every morning.    Yes Historical Provider, MD  metFORMIN (GLUMETZA) 1000 MG (MOD) 24 hr tablet Take 1,000 mg by mouth 2 (two) times daily with a meal. 12/19/15  Yes Historical Provider, MD  Multiple Vitamin (MULTIVITAMIN WITH MINERALS) TABS tablet Take 1 tablet by mouth daily.   Yes Historical Provider, MD  simvastatin (ZOCOR) 40 MG tablet Take 20 mg by mouth every morning.    Yes Historical Provider, MD     Vital Signs: BP 162/86 mmHg  Pulse 67  Temp(Src) 98 F (36.7 C) (Oral)  Resp 18  SpO2 100%  Physical Exam patient awake, alert. Chest clear to auscultation bilaterally. Heart with regular rate and rhythm. Abdomen obese, soft, positive bowel sounds, nontender; lower extremities with no significant edema.  Imaging: X-ray Chest Pa Or Ap  01/29/2016  CLINICAL DATA:  Operative examination prior to liver ablation. History of hepatocellular carcinoma, diabetes, hypertension, and former smoker. EXAM: CHEST 1 VIEW COMPARISON:  PA and lateral chest x-ray of May 28, 2014 FINDINGS: The lungs are adequately inflated. No pulmonary parenchymal nodules or masses are observed. The interstitial markings are mildly increased though stable. There is stable biapical pleural thickening. The heart is normal in size. The pulmonary vascularity is not engorged. The mediastinum is normal in width. The trachea is  midline. There is mild tortuosity of the descending thoracic aorta. The bony thorax exhibits no acute abnormality. IMPRESSION: There is no acute cardiopulmonary abnormality. Electronically Signed   By: David  Martinique M.D.   On: 01/29/2016 10:19    Labs:  CBC:  Recent Labs  08/01/15 0845 10/17/15 1016 11/13/15 0949 01/29/16 0830  WBC 4.9 5.2 4.8 5.0  HGB  12.9* 12.7* 12.3* 13.2  HCT 40.2 39.7 38.9 41.1  PLT 254 235 220 254    COAGS:  Recent Labs  08/01/15 0845 01/29/16 0830  INR 1.03 0.98  APTT  --  34    BMP:  Recent Labs  04/12/15 0735 08/01/15 0845 10/17/15 1016 11/13/15 0950 01/13/16 0735 01/29/16 0830  NA  --  141 138 141 138 138  K  --  4.4 4.5 4.3 4.4 4.9  CL  --  101  --   --  99 104  CO2  --  28 26 29 25 26   GLUCOSE  --  121* 120 128 141* 132*  BUN 18 14 14.4 11.5 10 24*  CALCIUM  --  9.5 9.6 9.6 9.2 9.7  CREATININE 1.14 0.98 1.1 1.1 1.06 1.25*  GFRNONAA 66  --   --   --   --  57*  GFRAA 76  --   --   --   --  >60    LIVER FUNCTION TESTS:  Recent Labs  10/17/15 1016 11/13/15 0950 01/13/16 0735 01/29/16 0830  BILITOT 0.57 0.64 0.6 0.8  AST 28 26 20  32  ALT 20 19 16 28   ALKPHOS 41 37* 34* 35*  PROT 8.3  7.5 7.7 7.0 8.4*  ALBUMIN 4.1 3.9 4.2 4.7    Assessment and Plan: Patient with history of cirrhosis, hepatitis C and right hepatic lobe [place]; status post prior DEB-TACE as well as thermal ablation of right hepatic tumor in 2015. Follow up MRI abdomen on 01/16/16 reveals a new 1.3 cm lesion superior to the ablation site concerning for [facility] recurrence. He presents again today following recent discussion with Dr. Henn/ Dr. Kathlene Cote for CT-guided thermal ablation of this new liver lesion. Details/risks of procedure, including not limited to, internal bleeding, injury to adjacent organs, infection, worsening renal function, anesthesia-related complications, discussed with patient and wife with their understanding and consent. Post procedure the patient will be admitted for overnight observation.   Electronically Signed: D. Rowe Robert 01/31/2016, 10:56 AM   I spent a total of 30 minutes at the the patient's bedside AND on the patient's hospital floor or unit, greater than 50% of which was counseling/coordinating care for CT-guided thermal ablation of liver tumor

## 2016-01-31 NOTE — Progress Notes (Signed)
Patient ID: Aaron [name], male   DOB: 1946-03-28, 70 y.o.   MRN: [id number]    Referring Physician(s): [id number]  Supervising Physician: Aletta Edouard  Patient Status: In-pt  Chief Complaint:  Hepatocellular carcinoma  Subjective: Patient doing fairly well; only complaint at this time is some mild right shoulder discomfort, likely referred from hepatic capsule/diaphragmatic irritation; denies significant abdominal pain, nausea or vomiting.   Allergies: Review of patient's allergies indicates no known allergies.  Medications: Prior to Admission medications   Medication Sig Start Date End Date Taking? Authorizing Provider  ALPRAZolam Duanne Moron) 0.5 MG tablet Take 1 tablet (0.5 mg total) by mouth once. Patient taking differently: Take 0.5 mg by mouth once as needed (Take before MRI procedures.).  01/01/16 12/31/16 Yes Markus Daft, MD  aspirin EC 81 MG tablet Take 81 mg by mouth every morning.    Yes Historical Provider, MD  cholecalciferol (VITAMIN D) 1000 UNITS tablet Take 1,000 Units by mouth every morning.    Yes Historical Provider, MD  Insulin Glargine (LANTUS SOLOSTAR) 100 UNIT/ML Solostar Pen Inject 31 Units into the skin every morning.    Yes Historical Provider, MD  insulin lispro (HUMALOG) 100 UNIT/ML injection Inject 3 Units into the skin daily before supper.   Yes Historical Provider, MD  lisinopril (PRINIVIL,ZESTRIL) 20 MG tablet Take 20 mg by mouth every morning.    Yes Historical Provider, MD  metFORMIN (GLUMETZA) 1000 MG (MOD) 24 hr tablet Take 1,000 mg by mouth 2 (two) times daily with a meal. 12/19/15  Yes Historical Provider, MD  Multiple Vitamin (MULTIVITAMIN WITH MINERALS) TABS tablet Take 1 tablet by mouth daily.   Yes Historical Provider, MD  simvastatin (ZOCOR) 40 MG tablet Take 20 mg by mouth every morning.    Yes Historical Provider, MD     Vital Signs: BP 185/85 mmHg  Pulse 85  Temp(Src) 98.4 F (36.9 C) (Oral)  Resp 16  SpO2 100%  Physical Exam awake,  alert. Puncture site right upper quadrant abdomen clean, nontender, abdomen soft  Imaging: X-ray Chest Pa Or Ap  01/29/2016  CLINICAL DATA:  Operative examination prior to liver ablation. History of hepatocellular carcinoma, diabetes, hypertension, and former smoker. EXAM: CHEST 1 VIEW COMPARISON:  PA and lateral chest x-ray of May 28, 2014 FINDINGS: The lungs are adequately inflated. No pulmonary parenchymal nodules or masses are observed. The interstitial markings are mildly increased though stable. There is stable biapical pleural thickening. The heart is normal in size. The pulmonary vascularity is not engorged. The mediastinum is normal in width. The trachea is midline. There is mild tortuosity of the descending thoracic aorta. The bony thorax exhibits no acute abnormality. IMPRESSION: There is no acute cardiopulmonary abnormality. Electronically Signed   By: David  Martinique M.D.   On: 01/29/2016 10:19   Ct Guide Tissue Ablation  01/31/2016  CLINICAL DATA:  History of hepatocellular carcinoma with prior chemo embolization with doxorubicin loaded drug-eluting beads on 03/06/2014. Development of new enhancing tumor superior to the previous treated carcinoma and consistent with hepatocellular carcinoma recurrence by MRI. The patient presents for percutaneous ablation of the carcinoma recurrence. EXAM: CT-GUIDED PERCUTANEOUS THERMAL ABLATION OF LIVER COMPARISON:  MRI of the abdomen on 01/16/2016 ANESTHESIA/SEDATION: Anesthesia:  General Medications: 2 g IV Ancef. As antibiotic prophylaxis, Ancef was ordered pre-procedure and administered intravenously within one hour of incision. CONTRAST:  75 mL Isovue 370 IV PROCEDURE: The procedure, risks, benefits, and alternatives were explained to the patient. Questions regarding the procedure were encouraged and answered. The patient  understands and consents to the procedure. A time-out was performed prior to the procedure. The patient was placed under general  anesthesia. Initial unenhanced CT was performed in a supine position to localize the liver. The right abdominal wall was prepped with chlorhexidine in a sterile fashion, and a sterile drape was applied covering the operative field. A sterile gown and sterile gloves were used for the procedure. Arterial and venous phase CT was performed of the liver for localization purposes. Under CT guidance, 2 separate NeuWave PR XT probes were advanced into the liver. Thermal ablation was performed with a 10 minutes cycle at 65 watts. Ultrasound was used to monitor the ablation zone and CT was also performed during ablation. After ablation, both probes were removed has tract cautery was performed. COMPLICATIONS: None FINDINGS: With contrast administration, the nodular tumor recurrence superior to the previously treated carcinoma was well visualized. Ablation probe was placed through this nodule. Decision was made to also place another probe just inferior to the nodule in order to more fully treat the zone of tumor recurrence as well as extend the ablation zone into the previously treated carcinoma. IMPRESSION: CT guided percutaneous thermal ablation of nodular hepatocellular carcinoma recurrence. The patient will be observed overnight. Initial follow-up will be performed in approximately 4 weeks. Electronically Signed   By: Aletta Edouard M.D.   On: 01/31/2016 16:24    Labs:  CBC:  Recent Labs  08/01/15 0845 10/17/15 1016 11/13/15 0949 01/29/16 0830  WBC 4.9 5.2 4.8 5.0  HGB 12.9* 12.7* 12.3* 13.2  HCT 40.2 39.7 38.9 41.1  PLT 254 235 220 254    COAGS:  Recent Labs  08/01/15 0845 01/29/16 0830  INR 1.03 0.98  APTT  --  34    BMP:  Recent Labs  04/12/15 0735 08/01/15 0845 10/17/15 1016 11/13/15 0950 01/13/16 0735 01/29/16 0830  NA  --  141 138 141 138 138  K  --  4.4 4.5 4.3 4.4 4.9  CL  --  101  --   --  99 104  CO2  --  28 26 29 25 26   GLUCOSE  --  121* 120 128 141* 132*  BUN 18 14  14.4 11.5 10 24*  CALCIUM  --  9.5 9.6 9.6 9.2 9.7  CREATININE 1.14 0.98 1.1 1.1 1.06 1.25*  GFRNONAA 66  --   --   --   --  57*  GFRAA 76  --   --   --   --  >60    LIVER FUNCTION TESTS:  Recent Labs  10/17/15 1016 11/13/15 0950 01/13/16 0735 01/29/16 0830  BILITOT 0.57 0.64 0.6 0.8  AST 28 26 20  32  ALT 20 19 16 28   ALKPHOS 41 37* 34* 35*  PROT 8.3  7.5 7.7 7.0 8.4*  ALBUMIN 4.1 3.9 4.2 4.7    Assessment and Plan: Patient with history of cirrhosis, hepatitis C, right hepatic lobe [place] with prior DEB -TACE as well as thermal ablation in 2015. Status post CT-guided thermal ablation of nodular recurrence of HCC and  adjacent tumor previously treated with DEB-TACE today; for overnight obs; check AM labs; follow-up with Dr. Anselm Pancoast in [place] clinic in one month   Electronically Signed: D. Rowe Robert 01/31/2016, 5:12 PM   I spent a total of 15 minutes at the the patient's bedside AND on the patient's hospital floor or unit, greater than 50% of which was counseling/coordinating care for CT-guided thermal ablation of hepatic tumor

## 2016-01-31 NOTE — Transfer of Care (Signed)
Immediate Anesthesia Transfer of Care Note  Patient: Aaron [name]  Procedure(s) Performed: Procedure(s): MICROWAVE ABLATION-LIVER (N/A)  Patient Location: PACU  Anesthesia Type:General  Level of Consciousness: awake, alert  and oriented  Airway & Oxygen Therapy: Patient Spontanous Breathing and Patient connected to face mask oxygen  Post-op Assessment: Report given to RN and Post -op Vital signs reviewed and stable  Post vital signs: Reviewed and stable  Last Vitals:  Filed Vitals:   01/31/16 0950  BP: 162/86  Pulse: 67  Temp: 36.7 C  Resp: 18    Last Pain: There were no vitals filed for this visit.       Complications: No apparent anesthesia complications

## 2016-01-31 NOTE — Procedures (Signed)
Interventional Radiology Procedure Note  Procedure:  CT guided thermal ablation of hepatocellular carcinoma  Anesthesia:  General  Complications:  None  Estimated Blood Loss: < 10 mL  CT guided ablation of nodular recurrence of HCC and adjacent tumor treated previously with chemoembolization with 2 NeuWave PR XT probes. 10 min ablation at [address].  Plan:  Overnight observation.  Venetia Night. Kathlene Cote, M.D Pager:  [phone]    Venetia Night. Kathlene Cote, M.D Pager:  [phone]  Venetia Night. Kathlene Cote, M.D Pager:  [phone]

## 2016-02-01 DIAGNOSIS — C220 Liver cell carcinoma: Secondary | ICD-10-CM | POA: Diagnosis not present

## 2016-02-01 LAB — COMPREHENSIVE METABOLIC PANEL
ALT: 242 U/L — ABNORMAL HIGH (ref 17–63)
AST: 394 U/L — ABNORMAL HIGH (ref 15–41)
Albumin: 3.9 g/dL (ref 3.5–5.0)
Alkaline Phosphatase: 29 U/L — ABNORMAL LOW (ref 38–126)
Anion gap: 6 (ref 5–15)
BUN: 19 mg/dL (ref 6–20)
CO2: 27 mmol/L (ref 22–32)
Calcium: 8.7 mg/dL — ABNORMAL LOW (ref 8.9–10.3)
Chloride: 104 mmol/L (ref 101–111)
Creatinine, Ser: 1.21 mg/dL (ref 0.61–1.24)
GFR calc Af Amer: >60 mL/min (ref >60)
GFR calc non Af Amer: 59 mL/min — ABNORMAL LOW (ref >60)
Glucose, Bld: 127 mg/dL — ABNORMAL HIGH (ref 65–99)
Potassium: 4.4 mmol/L (ref 3.5–5.1)
Sodium: 137 mmol/L (ref 135–145)
Total Bilirubin: 1 mg/dL (ref 0.3–1.2)
Total Protein: 7.2 g/dL (ref 6.5–8.1)

## 2016-02-01 LAB — CBC
HCT: 35.8 % — ABNORMAL LOW (ref 39.0–52.0)
Hemoglobin: 11.5 g/dL — ABNORMAL LOW (ref 13.0–17.0)
MCH: 22.2 pg — ABNORMAL LOW (ref 26.0–34.0)
MCHC: 32.1 g/dL (ref 30.0–36.0)
MCV: 69.1 fL — ABNORMAL LOW (ref 78.0–100.0)
Platelets: 213 10*3/uL (ref 150–400)
RBC: 5.18 MIL/uL (ref 4.22–5.81)
RDW: 14.9 % (ref 11.5–15.5)
WBC: 5.8 10*3/uL (ref 4.0–10.5)

## 2016-02-01 LAB — GLUCOSE, CAPILLARY: Glucose-Capillary: 151 mg/dL — ABNORMAL HIGH (ref 65–99)

## 2016-02-01 MED ORDER — SIMVASTATIN 10 MG PO TABS
20.0000 mg | ORAL_TABLET | Freq: Every morning | ORAL | Status: DC
  Administered 2016-02-01: 20 mg via ORAL
  Filled 2016-02-01: qty 2

## 2016-02-01 MED ORDER — INSULIN GLARGINE 100 UNIT/ML ~~LOC~~ SOLN
31.0000 [IU] | Freq: Every morning | SUBCUTANEOUS | Status: DC
  Administered 2016-02-01: 31 [IU] via SUBCUTANEOUS
  Filled 2016-02-01: qty 0.31

## 2016-02-01 NOTE — Progress Notes (Signed)
Patient discharged to home, all discharge medications and instructions reviewed and questions answered.  Patient to be assisted to vehicle by wheelchair.  

## 2016-02-01 NOTE — Discharge Summary (Signed)
Patient ID: Aaron [name] MRN: [id number] DOB/AGE: Oct 09, 1945 70 y.o.  Admit date: 01/31/2016 Discharge date: 02/01/2016  Supervising Physician: Jacqulynn Cadet  Admission Diagnoses: Hepatocellular carcinoma  Discharge Diagnoses:  Active Problems:   Hepatocellular carcinoma [facility])   Discharged Condition: good  Hospital Course:  Aaron [name] is familiar to IR service from prior right hepatic lobe hepatocellular carcinoma DEB-TACE in July 2015 followed by right hepatic lobe [place] thermal ablation in October 2015.   Recent follow-up MRI abdomen on 01/16/16 revealed a new small 1.3 cm lesion just superior to the ablation site worrisome for [facility] recurrence.   He presented yesterday following discussion with Dr. Augusto Garbe. Kathlene Cote for CT-guided thermal ablation of this new right hepatic lobe liver lesion.   He underwent CT guided thermal ablation of HCC yesterday by Dr. Kathlene Cote.  He did well overnight. No nausea. Tolerating PO.  He has some RUQ pain and requested Rx for pain medications.  Consults: None  Treatments:  CLINICAL DATA: History of hepatocellular carcinoma with prior chemo embolization with doxorubicin loaded drug-eluting beads on 03/06/2014. Development of new enhancing tumor superior to the previous treated carcinoma and consistent with hepatocellular carcinoma recurrence by MRI. The patient presents for percutaneous ablation of the carcinoma recurrence.  EXAM: CT-GUIDED PERCUTANEOUS THERMAL ABLATION OF LIVER  COMPARISON: MRI of the abdomen on 01/16/2016  ANESTHESIA/SEDATION: Anesthesia: General  Medications: 2 g IV Ancef. As antibiotic prophylaxis, Ancef was ordered pre-procedure and administered intravenously within one hour of incision.  CONTRAST: 75 mL Isovue 370 IV  PROCEDURE: The procedure, risks, benefits, and alternatives were explained to the patient. Questions regarding the procedure were encouraged and answered. The patient  understands and consents to the procedure. A time-out was performed prior to the procedure.  The patient was placed under general anesthesia. Initial unenhanced CT was performed in a supine position to localize the liver. The right abdominal wall was prepped with chlorhexidine in a sterile fashion, and a sterile drape was applied covering the operative field. A sterile gown and sterile gloves were used for the procedure.  Arterial and venous phase CT was performed of the liver for localization purposes. Under CT guidance, 2 separate NeuWave PR XT probes were advanced into the liver. Thermal ablation was performed with a 10 minutes cycle at 65 watts. Ultrasound was used to monitor the ablation zone and CT was also performed during ablation. After ablation, both probes were removed has tract cautery was performed.  COMPLICATIONS: None  FINDINGS: With contrast administration, the nodular tumor recurrence superior to the previously treated carcinoma was well visualized. Ablation probe was placed through this nodule. Decision was made to also place another probe just inferior to the nodule in order to more fully treat the zone of tumor recurrence as well as extend the ablation zone into the previously treated carcinoma.  IMPRESSION: CT guided percutaneous thermal ablation of nodular hepatocellular carcinoma recurrence. The patient will be observed overnight. Initial follow-up will be performed in approximately 4 weeks.   Electronically Signed  By: Aletta Edouard M.D.  On: 01/31/2016 16:24   Discharge Exam: Blood pressure 139/77, pulse 72, temperature 98.6 F (37 C), temperature source Oral, resp. rate 16, height 5\' 1"  (1.549 m), weight 264 lb 11.2 oz (120.067 kg), SpO2 100 %. Awake and Alert NAD Abdomen mildly tender to palpation at RUQ Heart RRR Lungs Clear.  Disposition: 01-Home or Self Care He will follow up with Dr. Kathlene Cote in 1 months at [place] He will be given Rx for Oxy  IR 1 PO q 4 hours prn pain #20 with no refills and Cipro 500 mg 1 po BID x 7 days.    Medication List    TAKE these medications        ALPRAZolam 0.5 MG tablet  Commonly known as:  XANAX  Take 1 tablet (0.5 mg total) by mouth once.     aspirin EC 81 MG tablet  Take 81 mg by mouth every morning.     cholecalciferol 1000 units tablet  Commonly known as:  VITAMIN D  Take 1,000 Units by mouth every morning.     insulin lispro 100 UNIT/ML injection  Commonly known as:  HUMALOG  Inject 3 Units into the skin daily before supper.     LANTUS SOLOSTAR 100 UNIT/ML Solostar Pen  Generic drug:  Insulin Glargine  Inject 31 Units into the skin every morning.     lisinopril 20 MG tablet  Commonly known as:  PRINIVIL,ZESTRIL  Take 20 mg by mouth every morning.     metFORMIN 1000 MG (MOD) 24 hr tablet  Commonly known as:  GLUMETZA  Take 1,000 mg by mouth 2 (two) times daily with a meal.     multivitamin with minerals Tabs tablet  Take 1 tablet by mouth daily.     simvastatin 40 MG tablet  Commonly known as:  ZOCOR  Take 20 mg by mouth every morning.          Electronically Signed: Murrell Redden PA-C 02/01/2016, 9:23 AM   I have spent Less Than 30 Minutes discharging Aaron [name].

## 2016-02-01 NOTE — Discharge Instructions (Signed)
Radiofrequency Ablation of Liver Tumors  Ablation is a procedure that destroys specific cells in the body. Radiofrequency means that high-energy radio waves are used for this procedure. In radiofrequency ablation of liver tumors, a needle-like probe is placed close to the tumor. The probe uses radio waves to produce heat that kills the cancer cells. Months later, the dead tumor cells turn into harmless scar tissue. This procedure is usually used:  For smaller tumors (less than about 1 in [3.8 cm]).  In people whose medical condition makes surgery too dangerous.  For tumors that are in risky locations, have not shrunk with chemotherapy, or that have come back after having been removed through surgery.  In people who have certain types of liver cancer and are on a waiting list for a liver transplant.  LET [facility] CARE PROVIDER KNOW ABOUT:  All allergies you have.  All medicines that you are taking, including vitamins, herbs, eye drops, creams, and over-the-counter medicines.  Previous problems you or members of your family have had with the use of anesthetics.  Any blood disorders you have.  Previous surgeries you have had.  Medical conditions you have.  Possibility of pregnancy, if this applies. RISKS AND COMPLICATIONS  Generally, this is a safe procedure. However, as with any procedure, complications can occur. Possible complications include:  Infection.  Bleeding.  Pain.  Flu-like symptoms, including fever and achiness.  Injury to the surrounding organs, such as a lung or the intestines. BEFORE THE PROCEDURE  You may have blood tests done. These tests can help show how well your kidneys and liver are working. They can also show how well your blood clots.  Only take medicines as directed by your health care provider. You may need to stop taking medicines (such as blood thinners, aspirin, or nonsteroidal anti-inflammatory drugs) before the procedure.  Do not eat or drink for at least 6  hours before the procedure, or as directed by your health care provider.  Arrange for someone to drive you home after the procedure.  PROCEDURE  You will lie on an exam table and will be connected to monitors that keep track of your heart rate, blood pressure, and breathing throughout the procedure. You will have an IV tube placed in a vein.  You may be given a medicine that makes you go to sleep throughout the procedure (general anesthetic) or a medicine that helps you relax (sedative). You may also be given a medicine to numb the area where the probe will pass through the skin (local anesthetic).  Radiofrequency ablation can be done:  Through a regular surgical cut (incision). This type of ablation is called surgical radiofrequency ablation.  Through a tiny surgical incision, using a camera-like device to guide the probe to the liver tumor. This type of ablation is called laparoscopic radiofrequency ablation.  By using needle-sized electrodes that are passed through the skin directly into the area of the liver being treated. This type of ablation is called percutaneous radiofrequency ablation.  Ultrasound or CT scans are used to make sure the tip of the probe is in the right location.  Once the probe has been situated next to the tumor, radio waves will produce heat that kills the tumor cells. Depending on the size of the tumor, the probe may need to be repositioned several times.  Once the tumor has been destroyed, the probe will be removed and a bandage will be applied.  AFTER THE PROCEDURE  If you received a sedative or  a general anesthetic during the procedure, you will be sleepy for the first few hours.  You may have some pain or nausea. This can usually be controlled with medicines.  You will stay in the recovery room until you are awake and able to drink fluids.  This information is not intended to replace advice given to you by your health care provider. Make sure you discuss any questions  you have with your health care provider.  Document Released: 12/27/2008 Document Revised: 08/15/2013 Document Reviewed: 04/17/2013  Elsevier Interactive Patient Education [employer].

## 2016-02-03 LAB — GLUCOSE, CAPILLARY: Glucose-Capillary: 123 mg/dL — ABNORMAL HIGH (ref 65–99)

## 2016-02-07 ENCOUNTER — Telehealth: Payer: Self-pay | Admitting: Hematology

## 2016-02-07 NOTE — Telephone Encounter (Signed)
confirming apt change from 6/23 to 6/30... provider will be at [facility] the 23rd

## 2016-02-14 ENCOUNTER — Other Ambulatory Visit: Payer: 59

## 2016-02-14 ENCOUNTER — Ambulatory Visit: Payer: 59 | Admitting: Hematology

## 2016-02-20 ENCOUNTER — Telehealth: Payer: Self-pay | Admitting: Hematology

## 2016-02-20 NOTE — Telephone Encounter (Signed)
returned call and lvm for pt to call back to r/s °

## 2016-02-21 ENCOUNTER — Other Ambulatory Visit: Payer: 59

## 2016-02-21 ENCOUNTER — Ambulatory Visit: Payer: 59 | Admitting: Hematology

## 2016-02-23 DIAGNOSIS — Z719 Counseling, unspecified: Secondary | ICD-10-CM

## 2016-02-24 NOTE — Congregational Nurse Program (Signed)
Congregational Nurse Program Note  Date of Encounter: 02/23/2016  Past Medical History: Past Medical History  Diagnosis Date  . Hypertension   . Diabetes mellitus without complication ([place])   . Hyperlipidemia   . Hepatocellular carcinoma ([place]) 01/30/2014    Path  . Hyperlipidemia   . Hepatitis     hepatitis C  . Arthritis   . Anemia     hx of     Encounter Details:     CNP Questionnaire - 02/24/16 0038    Patient Demographics   Is this a new or existing patient? Existing   Patient is considered a/an Not Applicable   Race African-American/Black   Patient Assistance   Location of Patient Assistance [employer]   Patient's financial/insurance status Medicare   Uninsured Patient No   Patient referred to apply for the following financial assistance Not Applicable   Food insecurities addressed Not Applicable   Transportation assistance No   Assistance securing medications No   Educational health offerings Cancer;Spiritual care   Encounter Details   Primary purpose of visit Chronic Illness/Condition Visit;Post ED/Hospitalization Visit   Was an Emergency Department visit averted? Not Applicable   Does patient have a medical provider? Yes   Patient referred to Not Applicable   Was a mental health screening completed? (GAINS tool) No   Does patient have dental issues? No   Does patient have vision issues? No   Does your patient have an abnormal blood pressure today? No   Since previous encounter, have you referred patient for abnormal blood pressure that resulted in a new diagnosis or medication change? No   Does your patient have an abnormal blood glucose today? No   Since previous encounter, have you referred patient for abnormal blood glucose that resulted in a new diagnosis or medication change? No   Was there a life-saving intervention made? No     Client seen at [facility].  Client had procedure to remove "spot" on liver.  He is feeling better but still  not 100 percent.  Has been taking it easy and resting as needed.  This will hopefully take care of this area.  Offered concern and prayers for his recovery.

## 2016-02-25 ENCOUNTER — Encounter (HOSPITAL_COMMUNITY): Payer: Self-pay | Admitting: *Deleted

## 2016-02-25 ENCOUNTER — Emergency Department (HOSPITAL_COMMUNITY): Payer: 59

## 2016-02-25 ENCOUNTER — Emergency Department (HOSPITAL_COMMUNITY)
Admission: EM | Admit: 2016-02-25 | Discharge: 2016-02-26 | Disposition: A | Discharge status: Home / Self Care | Payer: 59 | Attending: Emergency Medicine | Admitting: Emergency Medicine

## 2016-02-25 DIAGNOSIS — E785 Hyperlipidemia, unspecified: Secondary | ICD-10-CM | POA: Diagnosis not present

## 2016-02-25 DIAGNOSIS — Z79899 Other long term (current) drug therapy: Secondary | ICD-10-CM | POA: Insufficient documentation

## 2016-02-25 DIAGNOSIS — Z7982 Long term (current) use of aspirin: Secondary | ICD-10-CM | POA: Insufficient documentation

## 2016-02-25 DIAGNOSIS — E119 Type 2 diabetes mellitus without complications: Secondary | ICD-10-CM | POA: Insufficient documentation

## 2016-02-25 DIAGNOSIS — I10 Essential (primary) hypertension: Secondary | ICD-10-CM | POA: Diagnosis not present

## 2016-02-25 DIAGNOSIS — Z8505 Personal history of malignant neoplasm of liver: Secondary | ICD-10-CM | POA: Insufficient documentation

## 2016-02-25 DIAGNOSIS — R1084 Generalized abdominal pain: Secondary | ICD-10-CM | POA: Diagnosis present

## 2016-02-25 DIAGNOSIS — Z87891 Personal history of nicotine dependence: Secondary | ICD-10-CM | POA: Diagnosis not present

## 2016-02-25 DIAGNOSIS — M1990 Unspecified osteoarthritis, unspecified site: Secondary | ICD-10-CM | POA: Diagnosis not present

## 2016-02-25 DIAGNOSIS — K529 Noninfective gastroenteritis and colitis, unspecified: Secondary | ICD-10-CM | POA: Diagnosis not present

## 2016-02-25 DIAGNOSIS — Z794 Long term (current) use of insulin: Secondary | ICD-10-CM | POA: Insufficient documentation

## 2016-02-25 LAB — COMPREHENSIVE METABOLIC PANEL
ALT: 15 U/L — ABNORMAL LOW (ref 17–63)
AST: 17 U/L (ref 15–41)
Albumin: 3.9 g/dL (ref 3.5–5.0)
Alkaline Phosphatase: 41 U/L (ref 38–126)
Anion gap: 9 (ref 5–15)
BUN: 20 mg/dL (ref 6–20)
CO2: 25 mmol/L (ref 22–32)
Calcium: 8.9 mg/dL (ref 8.9–10.3)
Chloride: 98 mmol/L — ABNORMAL LOW (ref 101–111)
Creatinine, Ser: 1.26 mg/dL — ABNORMAL HIGH (ref 0.61–1.24)
GFR calc Af Amer: >60 mL/min (ref >60)
GFR calc non Af Amer: 57 mL/min — ABNORMAL LOW (ref >60)
Glucose, Bld: 156 mg/dL — ABNORMAL HIGH (ref 65–99)
Potassium: 4.8 mmol/L (ref 3.5–5.1)
Sodium: 132 mmol/L — ABNORMAL LOW (ref 135–145)
Total Bilirubin: 0.9 mg/dL (ref 0.3–1.2)
Total Protein: 7.7 g/dL (ref 6.5–8.1)

## 2016-02-25 LAB — CBC
HCT: 39.5 % (ref 39.0–52.0)
Hemoglobin: 12.6 g/dL — ABNORMAL LOW (ref 13.0–17.0)
MCH: 22.1 pg — ABNORMAL LOW (ref 26.0–34.0)
MCHC: 31.9 g/dL (ref 30.0–36.0)
MCV: 69.3 fL — ABNORMAL LOW (ref 78.0–100.0)
Platelets: 295 10*3/uL (ref 150–400)
RBC: 5.7 MIL/uL (ref 4.22–5.81)
RDW: 15.2 % (ref 11.5–15.5)
WBC: 8.6 10*3/uL (ref 4.0–10.5)

## 2016-02-25 LAB — URINALYSIS, ROUTINE W REFLEX MICROSCOPIC
Bilirubin Urine: NEGATIVE
Glucose, UA: NEGATIVE mg/dL
Hgb urine dipstick: NEGATIVE
Ketones, ur: NEGATIVE mg/dL
Leukocytes, UA: NEGATIVE
Nitrite: NEGATIVE
Protein, ur: NEGATIVE mg/dL
Specific Gravity, Urine: 1.02 (ref 1.005–1.030)
pH: 5 (ref 5.0–8.0)

## 2016-02-25 LAB — LIPASE, BLOOD: Lipase: 28 U/L (ref 11–51)

## 2016-02-25 MED ORDER — DIATRIZOATE MEGLUMINE & SODIUM 66-10 % PO SOLN: 15.0000 mL | Freq: Once | ORAL | Status: DC

## 2016-02-25 MED ORDER — IOPAMIDOL (ISOVUE-300) INJECTION 61%
100.0000 mL | Freq: Once | INTRAVENOUS | Status: AC | PRN
  Administered 2016-02-25: 100 mL via INTRAVENOUS

## 2016-02-25 NOTE — ED Notes (Signed)
Pt states that he is having abd pain that began this am; pt states that it is mid abd and describes as burning; pt states that he took Tums without relief; pt states he took Pepto-Bismol and that helped for a little while; pt denies N/V/D

## 2016-02-25 NOTE — ED Provider Notes (Signed)
Complains of diffuse abdominal pain onset 9 AM today pain is intermittent lasting 2 minutes at a time going away for approximately 10 minutes of time. He is presently asymptomatic without treatment. No nausea or vomiting. No anorexia. No other associated symptoms. On exam patient is alert nontoxic lungs clear auscultation heart regular rate and rhythm abdomen obese, nondistended, normoactive bowel sounds, nontender. Genitalia normal male  Aaron Dakin, MD 02/25/16 2124

## 2016-02-25 NOTE — ED Provider Notes (Signed)
CSN: [id number]     Arrival date & time 02/25/16  1937 History  By signing my name below, I, Georgette Shell, attest that this documentation has been prepared under the direction and in the presence of Domenic Moras, PA-C. Electronically Signed: Georgette Shell, ED Scribe. 02/25/2016. 8:53 PM.    Chief Complaint  Patient presents with  . Abdominal Pain   The history is provided by the patient. No language interpreter was used.   HPI Comments: Aaron [name] is a 70 y.o. male with h/o HTN, DM, Hepatocellular carcinoma, and hyperlipidemia who presents to the Emergency Department complaining of "severe", intermittent, waxing and waning, burning, generalized abdominal pain onset around 9 am this morning. Per pt, each episode lasts less than 10 minutes. He states he ate [employer] before the pain began. Pt reports that laying down makes the pain lessen and is exacerbated with movement. Pt took Tums and Pepto Bismol with no relief. Pt has no h/o heartburn. Pt states he takes Metformin which normally causes a "bubbly" abd pain but notes that this one is different. Pt notes he has been diagnosed with cancer twice and was diagnosed beginning of last month, he is not on chemotherapy. Pt has an oncologist he regularly follows up with. Pt states that his bowel movement is normal. Pt denies constipation, chest pain, coughing, difficulty breathing, nausea, diarrhea and vomiting.    Past Medical History  Diagnosis Date  . Hypertension   . Diabetes mellitus without complication ([place])   . Hyperlipidemia   . Hepatocellular carcinoma ([place]) 01/30/2014    Path  . Hyperlipidemia   . Hepatitis     hepatitis C  . Arthritis   . Anemia     hx of    Past Surgical History  Procedure Laterality Date  . Colonoscopy    . Polypectomy     No family history on file. Social History  Substance Use Topics  . Smoking status: Former Smoker    Quit date: 01/30/1994  . Smokeless tobacco: Never Used     Comment: stopped 25 yrs ago  .  Alcohol Use: No     Comment: stoped 30 years ago     Review of Systems  Constitutional: Negative for fever.  Respiratory: Negative for cough and shortness of breath.   Cardiovascular: Negative for chest pain.  Gastrointestinal: Positive for abdominal pain. Negative for nausea, vomiting, diarrhea and constipation.  All other systems reviewed and are negative.    Allergies  Review of patient's allergies indicates no known allergies.  Home Medications   Prior to Admission medications   Medication Sig Start Date End Date Taking? Authorizing Provider  aspirin EC 81 MG tablet Take 81 mg by mouth every morning.    Yes Historical Provider, MD  bismuth subsalicylate (PEPTO BISMOL) 262 MG/15ML suspension Take 30 mLs by mouth every 6 (six) hours as needed for indigestion.   Yes Historical Provider, MD  calcium carbonate (TUMS - DOSED IN MG ELEMENTAL CALCIUM) 500 MG chewable tablet Chew 1 tablet by mouth daily.   Yes Historical Provider, MD  cholecalciferol (VITAMIN D) 1000 UNITS tablet Take 1,000 Units by mouth every morning.    Yes Historical Provider, MD  Insulin Glargine (LANTUS SOLOSTAR) 100 UNIT/ML Solostar Pen Inject 31 Units into the skin every morning.    Yes Historical Provider, MD  insulin lispro (HUMALOG) 100 UNIT/ML injection Inject 3 Units into the skin daily before supper.   Yes Historical Provider, MD  lisinopril-hydrochlorothiazide (PRINZIDE,ZESTORETIC) 20-12.5 MG tablet Take 1  tablet by mouth daily.   Yes Historical Provider, MD  metFORMIN (GLUCOPHAGE) 1000 MG tablet Take 1,000 mg by mouth 2 (two) times daily with a meal.   Yes Historical Provider, MD  Multiple Vitamin (MULTIVITAMIN WITH MINERALS) TABS tablet Take 1 tablet by mouth daily.   Yes Historical Provider, MD  simvastatin (ZOCOR) 40 MG tablet Take 20 mg by mouth every morning.    Yes Historical Provider, MD  ALPRAZolam Duanne Moron) 0.5 MG tablet Take 1 tablet (0.5 mg total) by mouth once. Patient not taking: Reported on  02/25/2016 01/01/16 12/31/16  Markus Daft, MD   BP 120/66 mmHg  Pulse 74  Temp(Src) 98.5 F (36.9 C) (Oral)  Resp 15  Ht 6' (1.829 m)  Wt 261 lb (118.389 kg)  BMI 35.39 kg/m2  SpO2 97% Physical Exam  Constitutional: He is oriented to person, place, and time. He appears well-developed and well-nourished.  HENT:  Head: Normocephalic.  Eyes: Conjunctivae are normal.  Cardiovascular: Normal rate.   Pulmonary/Chest: Effort normal. No respiratory distress.  Abdominal: Soft. Bowel sounds are normal. He exhibits no distension. There is no guarding.  Mild generalized abd tenderness with no guarding.   Musculoskeletal: Normal range of motion.  Neurological: He is alert and oriented to person, place, and time.  Skin: Skin is warm and dry.  Psychiatric: He has a normal mood and affect. His behavior is normal.  Nursing note and vitals reviewed.   ED Course  Procedures  DIAGNOSTIC STUDIES: Oxygen Saturation is 96% on RA, adequate by my interpretation.    COORDINATION OF CARE: 8:30 PM Discussed treatment plan with pt at bedside which includes lab work and CT scan and pt agreed to plan.  Labs Review Labs Reviewed  COMPREHENSIVE METABOLIC PANEL - Abnormal; Notable for the following:    Sodium 132 (*)    Chloride 98 (*)    Glucose, Bld 156 (*)    Creatinine, Ser 1.26 (*)    ALT 15 (*)    GFR calc non Af Amer 57 (*)    All other components within normal limits  CBC - Abnormal; Notable for the following:    Hemoglobin 12.6 (*)    MCV 69.3 (*)    MCH 22.1 (*)    All other components within normal limits  LIPASE, BLOOD  URINALYSIS, ROUTINE W REFLEX MICROSCOPIC (NOT AT [facility])    Imaging Review Ct Abdomen Pelvis W Contrast  02/25/2016  CLINICAL DATA:  Intermittent abdominal pain beginning at 0900 hours. History of hepatocellular cancer, with recurrence. Status post ablation January 31, 2016. History of hypertension and diabetes. EXAM: CT ABDOMEN AND PELVIS WITH CONTRAST TECHNIQUE: Multidetector  CT imaging of the abdomen and pelvis was performed using the standard protocol following bolus administration of intravenous contrast. CONTRAST:  130mL ISOVUE-300 IOPAMIDOL (ISOVUE-300) INJECTION 61% COMPARISON:  CT abdomen and pelvis Dec 28, 2013 and CT hepatic ablation January 31, 2016. FINDINGS: LUNG BASES: Mild RIGHT lung base pleural thickening. Included heart size is normal. No pericardial effusions. SOLID ORGANS: Wedge-like hypodensity RIGHT lobe of the liver measuring 8.6 x 4 cm corresponding to treated metastasis, faint central density on early and delayed phase and punctate focus of gas. The spleen, gallbladder, pancreas and adrenal glands are unremarkable. GASTROINTESTINAL TRACT: Long segment of circumferential small bowel wall thickening and edema with mesenteric fat stranding mid abdomen. The stomach, large bowel are normal in course and caliber without inflammatory changes. Normal appendix. KIDNEYS/ URINARY TRACT: Kidneys are orthotopic, demonstrating symmetric enhancement. No nephrolithiasis, hydronephrosis or  solid renal masses. Too small to characterize hypodensities LEFT kidney. The unopacified ureters are normal in course and caliber. Delayed imaging through the kidneys demonstrates symmetric prompt contrast excretion within the proximal urinary collecting system. Urinary bladder is partially distended and unremarkable. PERITONEUM/RETROPERITONEUM: Aortoiliac vessels are normal in course and caliber, mild calcific atherosclerosis. No lymphadenopathy by CT size criteria. Stable 9 mm short axis portal caval lymph node. Prostate size is mildly enlarged. Moderate amount of amount of perihepatic, spot perisplenic and pelvic free fluid, 6 Hounsfield units. No focal fluid collection. No intraperitoneal free air. SOFT TISSUE/OSSEOUS STRUCTURES: Non-suspicious. 11 mm soft tissue nodule contiguous with the pubic symphysis most compatible with degenerative cyst. Small lipoma LEFT anterior compartment muscle.  Multilevel moderate to severe facet arthropathy. IMPRESSION: Enteritis versus inflammatory bowel disease involving small bowel without complication. Moderate amount ascites is likely reactive. Status post ablation of RIGHT hepatic lobe mass with further necrosis. Electronically Signed   By: Elon Alas M.D.   On: 02/25/2016 23:47   I have personally reviewed and evaluated these images as part of my medical decision-making.   MDM   Final diagnoses:  Enteritis    BP 110/62 mmHg  Pulse 90  Temp(Src) 98.3 F (36.8 C) (Oral)  Resp 16  Ht 6' (1.829 m)  Wt 118.389 kg  BMI 35.39 kg/m2  SpO2 95%   I personally performed the services described in this documentation, which was scribed in my presence. The recorded information has been reviewed and is accurate.     12:24 AM Patient presents with mid abdominal pain earlier today without any associate nausea vomiting or diarrhea. Pain is minimal on initial examination. Workup is fairly unremarkable, abdominal and pelvic CT scan demonstrate enteritis versus inflammatory bowel disease involving small bowel without complication. I suspect this is likely enteritis. Given his age and no prior history of inflammatory bowel disease, have no suspicion for that. His pain is currently well controlled without any specific treatment. He is afebrile with stable normal vital sign. At this time I do not think antibiotic is indicated after discussing the risks and benefits. Patient is scheduled to follow-up with primary care provider earlier in the morning tomorrow. Patient understands to return if his condition worsened. Otherwise he is stable for discharge.  Care discussed with Dr. Starr Sinclair, PA-C 02/26/16 [id number]  Orlie Dakin, MD 02/26/16 [phone]

## 2016-02-26 ENCOUNTER — Ambulatory Visit
Admission: RE | Admit: 2016-02-26 | Discharge: 2016-02-26 | Disposition: A | Discharge status: Home / Self Care | Payer: 59 | Source: Ambulatory Visit | Attending: Physician Assistant | Admitting: Physician Assistant

## 2016-02-26 ENCOUNTER — Telehealth: Payer: Self-pay | Admitting: Hematology

## 2016-02-26 DIAGNOSIS — C220 Liver cell carcinoma: Secondary | ICD-10-CM

## 2016-02-26 HISTORY — PX: IR GENERIC HISTORICAL: IMG1180011

## 2016-02-26 MED ORDER — ONDANSETRON HCL 4 MG PO TABS: 4.0000 mg | ORAL_TABLET | Freq: Four times a day (QID) | ORAL | Status: DC

## 2016-02-26 MED ORDER — ACETAMINOPHEN-CODEINE #3 300-30 MG PO TABS: 1.0000 | ORAL_TABLET | Freq: Four times a day (QID) | ORAL | Status: DC | PRN

## 2016-02-26 MED ORDER — MORPHINE SULFATE (PF) 4 MG/ML IV SOLN
4.0000 mg | Freq: Once | INTRAVENOUS | Status: AC
  Administered 2016-02-26: 4 mg via INTRAVENOUS
  Filled 2016-02-26: qty 1

## 2016-02-26 NOTE — Telephone Encounter (Signed)
Moved 7/7 lab/fu to 7/12 due to [place] covering AP. Appointments restored to 7/7 due to [place] only covering [facility] in the PM. Left message for patient confirming 7/7 appointment and asking that he disregard any mychart alert regarding appointment change.

## 2016-02-26 NOTE — Discharge Instructions (Signed)

## 2016-02-26 NOTE — Progress Notes (Signed)
Patient ID: Aaron [name], male   DOB: 10-30-1945, 70 y.o.   MRN: [id number]   Referring Physician(s): Roosevelt Locks, NP  Chief Complaint: The patient is seen in follow up today s/p CT guided thermal ablation of hepatocellular carcinoma, Dr. Kathlene Cote on 01-31-16  History of present illness:  Aaron [name] is a pleasant 69 yo male who has undergone treatment with Dr. Anselm Pancoast in 2015, DEB-TACE followed by [place], for hepatocellular carcinoma, likely secondary to Hepatitis C and cirrhosis.  We saw him in May of 2017 for his surveillance visit.  This MRI revealed a new satellite lesion concerning for recurrence.  Therefore, we arranged for a repeat MWA, which was done by Dr. Kathlene Cote, due to scheduling issues, on 01-31-16.  The patient did very well with this procedure.  He had minimal pain that did not require pain medications.  He was slightly constipated for the first couple of days and since then his bowels have returned to normal.  He denies any post procedure fevers or chills.    He has been doing well since the procedure until yesterday around 0900am he began having centralized abdominal pain.  He did not have any associated symptoms such as N/V/D.  He went to [facility] where he had a CT scan that revealed enteritis.  The ablated area in the liver revealed necrosis, as expected, after the [place].  He has not eaten since yesterday morning.  They did give him some pain medication that relieved his pain and as of this morning, it has not returned.  He feels much better this morning at the time of this visit.  His WBC was normal yesterday and his LFTs were essentially normal as well.  Past Medical History  Diagnosis Date  . Hypertension   . Diabetes mellitus without complication ([place])   . Hyperlipidemia   . Hepatocellular carcinoma ([place]) 01/30/2014    Path  . Hyperlipidemia   . Hepatitis     hepatitis C  . Arthritis   . Anemia     hx of     Past Surgical History  Procedure Laterality Date  . Colonoscopy    .  Polypectomy      Allergies: Review of patient's allergies indicates no known allergies.  Medications: Prior to Admission medications   Medication Sig Start Date End Date Taking? Authorizing Provider  acetaminophen-codeine (TYLENOL #3) 300-30 MG tablet Take 1-2 tablets by mouth every 6 (six) hours as needed for moderate pain. 02/26/16   Domenic Moras, PA-C  ALPRAZolam Duanne Moron) 0.5 MG tablet Take 1 tablet (0.5 mg total) by mouth once. Patient not taking: Reported on 02/25/2016 01/01/16 12/31/16  Markus Daft, MD  aspirin EC 81 MG tablet Take 81 mg by mouth every morning.     Historical Provider, MD  bismuth subsalicylate (PEPTO BISMOL) 262 MG/15ML suspension Take 30 mLs by mouth every 6 (six) hours as needed for indigestion.    Historical Provider, MD  calcium carbonate (TUMS - DOSED IN MG ELEMENTAL CALCIUM) 500 MG chewable tablet Chew 1 tablet by mouth daily.    Historical Provider, MD  cholecalciferol (VITAMIN D) 1000 UNITS tablet Take 1,000 Units by mouth every morning.     Historical Provider, MD  Insulin Glargine (LANTUS SOLOSTAR) 100 UNIT/ML Solostar Pen Inject 31 Units into the skin every morning.     Historical Provider, MD  insulin lispro (HUMALOG) 100 UNIT/ML injection Inject 3 Units into the skin daily before supper.    Historical Provider, MD  lisinopril-hydrochlorothiazide (PRINZIDE,ZESTORETIC) 20-12.5 MG tablet  Take 1 tablet by mouth daily.    Historical Provider, MD  metFORMIN (GLUCOPHAGE) 1000 MG tablet Take 1,000 mg by mouth 2 (two) times daily with a meal.    Historical Provider, MD  Multiple Vitamin (MULTIVITAMIN WITH MINERALS) TABS tablet Take 1 tablet by mouth daily.    Historical Provider, MD  ondansetron (ZOFRAN) 4 MG tablet Take 1 tablet (4 mg total) by mouth every 6 (six) hours. 02/26/16   Domenic Moras, PA-C  simvastatin (ZOCOR) 40 MG tablet Take 20 mg by mouth every morning.     Historical Provider, MD     No family history on file.  Social History   Social History  . Marital  Status: Married    Spouse Name: N/A  . Number of Children: N/A  . Years of Education: N/A   Social History Main Topics  . Smoking status: Former Smoker    Quit date: 01/30/1994  . Smokeless tobacco: Never Used     Comment: stopped 25 yrs ago  . Alcohol Use: No     Comment: stoped 30 years ago   . Drug Use: No  . Sexual Activity: Not Currently   Other Topics Concern  . Not on file   Social History Narrative     Vital Signs: BP 131/62 mmHg  Pulse 73  SpO2 99%  Physical Exam  Gen: pleasant, obese, black male who is sitting in his chair in NAD Heart: regular rate and rhythm. Bilateral radial pulses Lungs: CTAB Abd: soft, NT, ND, obese, +BS.  No mass or hernias noted.  Imaging: Ct Abdomen Pelvis W Contrast  02/25/2016  CLINICAL DATA:  Intermittent abdominal pain beginning at 0900 hours. History of hepatocellular cancer, with recurrence. Status post ablation January 31, 2016. History of hypertension and diabetes. EXAM: CT ABDOMEN AND PELVIS WITH CONTRAST TECHNIQUE: Multidetector CT imaging of the abdomen and pelvis was performed using the standard protocol following bolus administration of intravenous contrast. CONTRAST:  135mL ISOVUE-300 IOPAMIDOL (ISOVUE-300) INJECTION 61% COMPARISON:  CT abdomen and pelvis Dec 28, 2013 and CT hepatic ablation January 31, 2016. FINDINGS: LUNG BASES: Mild RIGHT lung base pleural thickening. Included heart size is normal. No pericardial effusions. SOLID ORGANS: Wedge-like hypodensity RIGHT lobe of the liver measuring 8.6 x 4 cm corresponding to treated metastasis, faint central density on early and delayed phase and punctate focus of gas. The spleen, gallbladder, pancreas and adrenal glands are unremarkable. GASTROINTESTINAL TRACT: Long segment of circumferential small bowel wall thickening and edema with mesenteric fat stranding mid abdomen. The stomach, large bowel are normal in course and caliber without inflammatory changes. Normal appendix. KIDNEYS/ URINARY  TRACT: Kidneys are orthotopic, demonstrating symmetric enhancement. No nephrolithiasis, hydronephrosis or solid renal masses. Too small to characterize hypodensities LEFT kidney. The unopacified ureters are normal in course and caliber. Delayed imaging through the kidneys demonstrates symmetric prompt contrast excretion within the proximal urinary collecting system. Urinary bladder is partially distended and unremarkable. PERITONEUM/RETROPERITONEUM: Aortoiliac vessels are normal in course and caliber, mild calcific atherosclerosis. No lymphadenopathy by CT size criteria. Stable 9 mm short axis portal caval lymph node. Prostate size is mildly enlarged. Moderate amount of amount of perihepatic, spot perisplenic and pelvic free fluid, 6 Hounsfield units. No focal fluid collection. No intraperitoneal free air. SOFT TISSUE/OSSEOUS STRUCTURES: Non-suspicious. 11 mm soft tissue nodule contiguous with the pubic symphysis most compatible with degenerative cyst. Small lipoma LEFT anterior compartment muscle. Multilevel moderate to severe facet arthropathy. IMPRESSION: Enteritis versus inflammatory bowel disease involving small bowel without complication.  Moderate amount ascites is likely reactive. Status post ablation of RIGHT hepatic lobe mass with further necrosis. Electronically Signed   By: Elon Alas M.D.   On: 02/25/2016 23:47    Labs:  CBC:  Recent Labs  11/13/15 0949 01/29/16 0830 02/01/16 0327 02/25/16 2042  WBC 4.8 5.0 5.8 8.6  HGB 12.3* 13.2 11.5* 12.6*  HCT 38.9 41.1 35.8* 39.5  PLT 220 254 213 295    COAGS:  Recent Labs  08/01/15 0845 01/29/16 0830  INR 1.03 0.98  APTT  --  34    BMP:  Recent Labs  04/12/15 0735  01/13/16 0735 01/29/16 0830 02/01/16 0327 02/25/16 2042  NA  --   < > 138 138 137 132*  K  --   < > 4.4 4.9 4.4 4.8  CL  --   < > 99 104 104 98*  CO2  --   < > 25 26 27 25   GLUCOSE  --   < > 141* 132* 127* 156*  BUN 18  < > 10 24* 19 20  CALCIUM  --   <  > 9.2 9.7 8.7* 8.9  CREATININE 1.14  < > 1.06 1.25* 1.21 1.26*  GFRNONAA 66  --   --  57* 59* 57*  GFRAA 76  --   --  >60 >60 >60  < > = values in this interval not displayed.  LIVER FUNCTION TESTS:  Recent Labs  01/13/16 0735 01/29/16 0830 02/01/16 0327 02/25/16 2042  BILITOT 0.6 0.8 1.0 0.9  AST 20 32 394* 17  ALT 16 28 242* 15*  ALKPHOS 34* 35* 29* 41  PROT 7.0 8.4* 7.2 7.7  ALBUMIN 4.2 4.7 3.9 3.9    Assessment:  1. Recurrent hepatocellular carcinoma, s/p CT guided thermal ablation on 01-31-16 by Dr. Kathlene Cote  The patient is doing very well s/p the above mentioned procedure.  He is not having any pain or problems related to this procedure.  He does have radiologic evidence of enteritis, which is felt to likely be viral, at this time, but seems improved.  We have encouraged him to call his PCP to inform him of the ED visit as well as to arrange outpatient follow up as needed.  From our standpoint, the patient is doing well and will need to follow back up in 2 months after he has had a MR abdomen W/WO contrast to evaluate this area of ablation.  This was discussed with the patient and he understands and is agreeable to this plan.  Signed: Henreitta Cea 02/26/2016, 8:29 AM   Please refer to Dr. Anselm Pancoast attestation of this note for management and plan.

## 2016-02-27 ENCOUNTER — Other Ambulatory Visit: Payer: Self-pay | Admitting: *Deleted

## 2016-02-27 DIAGNOSIS — D472 Monoclonal gammopathy: Secondary | ICD-10-CM

## 2016-02-28 ENCOUNTER — Telehealth: Payer: Self-pay | Admitting: Hematology

## 2016-02-28 ENCOUNTER — Ambulatory Visit: Payer: 59 | Admitting: Hematology

## 2016-02-28 ENCOUNTER — Ambulatory Visit (HOSPITAL_BASED_OUTPATIENT_CLINIC_OR_DEPARTMENT_OTHER): Payer: 59 | Admitting: Hematology

## 2016-02-28 ENCOUNTER — Encounter: Payer: Self-pay | Admitting: Hematology

## 2016-02-28 ENCOUNTER — Other Ambulatory Visit (HOSPITAL_BASED_OUTPATIENT_CLINIC_OR_DEPARTMENT_OTHER): Payer: 59

## 2016-02-28 ENCOUNTER — Other Ambulatory Visit: Payer: 59

## 2016-02-28 VITALS — BP 151/66 | HR 72 | Temp 98.3°F | Resp 18 | Ht 72.0 in | Wt 259.8 lb

## 2016-02-28 DIAGNOSIS — D472 Monoclonal gammopathy: Secondary | ICD-10-CM

## 2016-02-28 DIAGNOSIS — D509 Iron deficiency anemia, unspecified: Secondary | ICD-10-CM | POA: Diagnosis not present

## 2016-02-28 DIAGNOSIS — C220 Liver cell carcinoma: Secondary | ICD-10-CM

## 2016-02-28 LAB — COMPREHENSIVE METABOLIC PANEL
ALT: 17 U/L (ref 0–55)
AST: 26 U/L (ref 5–34)
Albumin: 3.8 g/dL (ref 3.5–5.0)
Alkaline Phosphatase: 51 U/L (ref 40–150)
Anion Gap: 12 mEq/L — ABNORMAL HIGH (ref 3–11)
BUN: 22.2 mg/dL (ref 7.0–26.0)
CO2: 25 mEq/L (ref 22–29)
Calcium: 9.7 mg/dL (ref 8.4–10.4)
Chloride: 102 mEq/L (ref 98–109)
Creatinine: 1.4 mg/dL — ABNORMAL HIGH (ref 0.7–1.3)
EGFR: 58 mL/min/{1.73_m2} — ABNORMAL LOW (ref >90)
Glucose: 168 mg/dl — ABNORMAL HIGH (ref 70–140)
Potassium: 5 mEq/L (ref 3.5–5.1)
Sodium: 138 mEq/L (ref 136–145)
Total Bilirubin: 0.44 mg/dL (ref 0.20–1.20)
Total Protein: 8.1 g/dL (ref 6.4–8.3)

## 2016-02-28 LAB — CBC WITH DIFFERENTIAL/PLATELET
BASO%: 1.2 % (ref 0.0–2.0)
Basophils Absolute: 0.1 10*3/uL (ref 0.0–0.1)
EOS%: 2.8 % (ref 0.0–7.0)
Eosinophils Absolute: 0.1 10*3/uL (ref 0.0–0.5)
HCT: 36.3 % — ABNORMAL LOW (ref 38.4–49.9)
HGB: 11.4 g/dL — ABNORMAL LOW (ref 13.0–17.1)
LYMPH%: 26.1 % (ref 14.0–49.0)
MCH: 21.8 pg — ABNORMAL LOW (ref 27.2–33.4)
MCHC: 31.4 g/dL — ABNORMAL LOW (ref 32.0–36.0)
MCV: 69.6 fL — ABNORMAL LOW (ref 79.3–98.0)
MONO#: 0.4 10*3/uL (ref 0.1–0.9)
MONO%: 8.3 % (ref 0.0–14.0)
NEUT#: 3 10*3/uL (ref 1.5–6.5)
NEUT%: 61.6 % (ref 39.0–75.0)
Platelets: 235 10*3/uL (ref 140–400)
RBC: 5.22 10*6/uL (ref 4.20–5.82)
RDW: 15.8 % — ABNORMAL HIGH (ref 11.0–14.6)
WBC: 4.9 10*3/uL (ref 4.0–10.3)
lymph#: 1.3 10*3/uL (ref 0.9–3.3)

## 2016-02-28 MED ORDER — POLYSACCHARIDE IRON COMPLEX 150 MG PO CAPS: 150.0000 mg | ORAL_CAPSULE | Freq: Every day | ORAL | Status: DC

## 2016-02-28 NOTE — Telephone Encounter (Signed)
lvmf or pt regarding to 9.20 cx and moved to Jan 2018

## 2016-02-28 NOTE — Progress Notes (Signed)
[name] Kitchen    HEMATOLOGY/ONCOLOGY CLINIC NOTE  Date of Service: 02/28/2016  Patient Care Team: Jani Gravel, MD as PCP - General (Internal Medicine)  CHIEF COMPLAINTS/PURPOSE OF CONSULTATION:  MGUS  HISTORY OF PRESENTING ILLNESS:  Aaron [name] is a wonderful 70 y.o. male who has been referred to [place] by Dr Jani Gravel for evaluation and management of monoclonal gammopathy of undetermined significance.  Patient has history of hypertension, diabetes, dyslipidemia, hepatocellular carcinoma (rx with TACE and percutaneous thermal ablation), hepatitis C status post treatment, iron deficiency anemia in 2014 treated with oral iron.  Patient had an SPEP done by his primary care physician on 10/04/2015 that showed an M spike of 0.3 g/dL. It was presumably will be done due to his complaints of fatigue. He has had no significant anemia. No new bone pains. No fevers/chills/drenching night sweats.  No new anemia..  Outside labs show hemoglobin of 12.4 with microcytosis with an MCV of 70.5, normal WBC count of 5.1k.  No evidence of hypercalcemia or significant renal failure on his outside labs.   INTERVAL HISTORY  Aaron [name] is here for his scheduled 4 month follow-up. His MRI of the left shoulder in March 2017 did not show any significant bone lesions. He notes that he followed up with orthopedics and no additional recommendations were provided. Orthopedics consultation note is not available to me at this time. Patient had a follow-up MRI of his abdomen on 01/16/2016 showed a new small 1.3 cm lesion just superior to the ablation site was consistent with a hepatocellular carcinoma recurrence. There was no evidence of active [place] within the previous ablation site. Patient subsequently had a CT-guided ablation of his recurrent [place] on 01/31/2016 by interventional radiology.    MEDICAL HISTORY:  Past Medical History  Diagnosis Date  . Hypertension   . Diabetes mellitus without complication ([place])   . Hyperlipidemia     . Hepatocellular carcinoma ([place]) 01/30/2014    Path  . Hyperlipidemia   . Hepatitis     hepatitis C  . Arthritis   . Anemia     hx of   Obesity Hepatocellular carcinoma treated with TACE and percutaneous thermal ablation by interventional radiology. Last MRI on 08/06/2015 showed slight decrease in the size of the ablation defect involving the right lobe of the liver area and no findings to suggest residual or recurrent hepatocellular carcinoma. Mild changes of liver cirrhosis.  Hepatitis C genotype 1A status post treatment with Viekira and Ribavarin for 24 weeks.  Monoclonal gammopathy of undetermined significance.  SURGICAL HISTORY:  Status post microwave ablation[October 2015] of liver lesion and TACE [July 2015] for [place]. EGD and colonoscopy in 2016   SOCIAL HISTORY: Social History   Social History  . Marital Status: Married    Spouse Name: N/A  . Number of Children: N/A  . Years of Education: N/A   Occupational History  . Not on file.   Social History Main Topics  . Smoking status: Former Smoker    Quit date: 01/30/1994  . Smokeless tobacco: Never Used     Comment: stopped 25 yrs ago  . Alcohol Use: No     Comment: stoped 30 years ago   . Drug Use: No  . Sexual Activity: Not Currently   Other Topics Concern  . Not on file   Social History Narrative  Former smoker and smoked 1 pack per day for about 20 years starting at age 46 quit 82 years ago.  FAMILY HISTORY: History reviewed. No pertinent family  history.  ALLERGIES:  has No Known Allergies.  MEDICATIONS:  Current Outpatient Prescriptions  Medication Sig Dispense Refill  . acetaminophen-codeine (TYLENOL #3) 300-30 MG tablet Take 1-2 tablets by mouth every 6 (six) hours as needed for moderate pain. 15 tablet 0  . aspirin EC 81 MG tablet Take 81 mg by mouth every morning.     . cholecalciferol (VITAMIN D) 1000 UNITS tablet Take 1,000 Units by mouth every morning.     . Insulin Glargine (LANTUS SOLOSTAR)  100 UNIT/ML Solostar Pen Inject 31 Units into the skin every morning.     . insulin lispro (HUMALOG) 100 UNIT/ML injection Inject 3 Units into the skin daily before supper.    [name] Kitchen lisinopril-hydrochlorothiazide (PRINZIDE,ZESTORETIC) 20-12.5 MG tablet Take 1 tablet by mouth daily.    . metFORMIN (GLUCOPHAGE) 1000 MG tablet Take 1,000 mg by mouth 2 (two) times daily with a meal.    . Multiple Vitamin (MULTIVITAMIN WITH MINERALS) TABS tablet Take 1 tablet by mouth daily.    . simvastatin (ZOCOR) 40 MG tablet Take 20 mg by mouth every morning.     [name] Kitchen ALPRAZolam (XANAX) 0.5 MG tablet Take 1 tablet (0.5 mg total) by mouth once. (Patient not taking: Reported on 02/25/2016) 2 tablet 0  . bismuth subsalicylate (PEPTO BISMOL) 262 MG/15ML suspension Take 30 mLs by mouth every 6 (six) hours as needed for indigestion. Reported on 02/28/2016    . calcium carbonate (TUMS - DOSED IN MG ELEMENTAL CALCIUM) 500 MG chewable tablet Chew 1 tablet by mouth daily. Reported on 02/28/2016    . Hydrocodone-Acetaminophen 5-300 MG TABS Reported on 02/28/2016    . ondansetron (ZOFRAN) 4 MG tablet Take 1 tablet (4 mg total) by mouth every 6 (six) hours. (Patient not taking: Reported on 02/28/2016) 12 tablet 0  . oxyCODONE (OXY IR/ROXICODONE) 5 MG immediate release tablet Reported on 02/28/2016     No current facility-administered medications for this visit.    REVIEW OF SYSTEMS:    10 Point review of Systems was done is negative except as noted above.  PHYSICAL EXAMINATION: ECOG PERFORMANCE STATUS: 2 - Symptomatic, <50% confined to bed  . Filed Vitals:   02/28/16 0835  BP: 151/66  Pulse: 72  Temp: 98.3 F (36.8 C)  Resp: 18   Filed Weights   02/28/16 0835  Weight: 259 lb 12.8 oz (117.845 kg)   .Body mass index is 35.23 kg/(m^2).  GENERAL:alert, in no acute distress and comfortable SKIN: skin color, texture, turgor are normal, no rashes or significant lesions EYES: normal, conjunctiva are pink and non-injected, sclera  clear OROPHARYNX:no exudate, no erythema and lips, buccal mucosa, and tongue normal  NECK: supple, no JVD, thyroid normal size, non-tender, without nodularity LYMPH:  no palpable lymphadenopathy in the cervical, axillary or inguinal LUNGS: clear to auscultation with normal respiratory effort HEART: regular rate & rhythm,  no murmurs and no lower extremity edema ABDOMEN: abdomen soft, non-tender, normoactive bowel sounds , no palpable hepatosplenomegaly. Musculoskeletal: no cyanosis of digits and no clubbing  PSYCH: alert & oriented x 3 with fluent speech NEURO: no focal motor/sensory deficits  LABORATORY DATA:  I have reviewed the data as listed  . CBC Latest Ref Rng 02/28/2016 02/25/2016 02/01/2016  WBC 4.0 - 10.3 10e3/uL 4.9 8.6 5.8  Hemoglobin 13.0 - 17.1 g/dL 11.4(L) 12.6(L) 11.5(L)  Hematocrit 38.4 - 49.9 % 36.3(L) 39.5 35.8(L)  Platelets 140 - 400 10e3/uL 235 295 213   . CBC    Component Value Date/Time   WBC  4.9 02/28/2016 0814   WBC 8.6 02/25/2016 2042   RBC 5.22 02/28/2016 0814   RBC 5.70 02/25/2016 2042   HGB 11.4* 02/28/2016 0814   HGB 12.6* 02/25/2016 2042   HCT 36.3* 02/28/2016 0814   HCT 39.5 02/25/2016 2042   PLT 235 02/28/2016 0814   PLT 295 02/25/2016 2042   MCV 69.6* 02/28/2016 0814   MCV 69.3* 02/25/2016 2042   MCH 21.8* 02/28/2016 0814   MCH 22.1* 02/25/2016 2042   MCHC 31.4* 02/28/2016 0814   MCHC 31.9 02/25/2016 2042   RDW 15.8* 02/28/2016 0814   RDW 15.2 02/25/2016 2042   LYMPHSABS 1.3 02/28/2016 0814   LYMPHSABS 1.8 01/29/2016 0830   MONOABS 0.4 02/28/2016 0814   MONOABS 0.4 01/29/2016 0830   EOSABS 0.1 02/28/2016 0814   EOSABS 0.1 01/29/2016 0830   BASOSABS 0.1 02/28/2016 0814   BASOSABS 0.1 01/29/2016 0830    . CMP Latest Ref Rng 02/28/2016 02/25/2016 02/01/2016  Glucose 70 - 140 mg/dl 168(H) 156(H) 127(H)  BUN 7.0 - 26.0 mg/dL 22._0 Creatinine 0.7 - 1.3 mg/dL 1.4(H) 1.26(H) 1.21  Sodium 136 - 145 mEq/L 138 132(L) 137  Potassium 3.5 -  5.1 mEq/L 5.0 4.8 4.4  Chloride 101 - 111 mmol/L - 98(L) 104  CO2 22 - 29 mEq/L _1 Calcium 8.4 - 10.4 mg/dL 9.7 8.9 8.7(L)  Total Protein 6.4 - 8.3 g/dL 8.1 7.7 7.2  Total Bilirubin 0.20 - 1.20 mg/dL 0.44 0.9 1.0  Alkaline Phos 40 - 150 U/L 51 41 29(L)  AST 5 - 34 U/L 26 17 394(H)  ALT 0 - 55 U/L 17 15(L) 242(H)   Component     Latest Ref Rng 10/17/2015  IgG (Immunoglobin G), Serum     700 - 1600 mg/dL 1,273  IgA/Immunoglobulin A, Serum     61 - 437 mg/dL 235  IgM (Immunoglobin M), Srm     20 - 172 mg/dL 79  Total Protein     6.0 - 8.5 g/dL 7.5  Albumin SerPl Elph-Mcnc     2.9 - 4.4 g/dL 3.8  Alpha 1     0.0 - 0.4 g/dL 0.2  Alpha2 Glob SerPl Elph-Mcnc     0.4 - 1.0 g/dL 0.9  B-Globulin SerPl Elph-Mcnc     0.7 - 1.3 g/dL 1.2  Gamma Glob SerPl Elph-Mcnc     0.4 - 1.8 g/dL 1.4  M Protein SerPl Elph-Mcnc     Not Observed g/dL 0.2 (H)  Globulin, Total     2.2 - 3.9 g/dL 3.7  Albumin/Glob SerPl     0.7 - 1.7 1.1  IFE 1      Comment  Please Note (HCV):      Comment  Iron     42 - 163 ug/dL 65  TIBC     202 - 409 ug/dL 350  UIBC     117 - 376 ug/dL 286  %SAT     20 - 55 % 18 (L)  Beta 2     0.6 - 2.4 mg/L 2.2  LDH     125 - 245 U/L 177  Sed Rate     0 - 30 mm/hr 21  CRP     0.0 - 4.9 mg/L 2.8  Ferritin     22 - 316 ng/ml 88   IFE 1  Comment   Comments: Immunofixation shows IgG monoclonal protein with kappa light chain  specificity.  RADIOGRAPHIC STUDIES:   MRI OF THE LEFT SHOULDER WITHOUT AND WITH CONTRAST  TECHNIQUE: Multiplanar, multisequence MR imaging of the left shoulder was performed before and after the administration of intravenous contrast.  CONTRAST: 20 ml MULTIHANCE GADOBENATE DIMEGLUMINE 529 MG/ML IV SOLN  COMPARISON: Osseous survey 10/25/2015. PET CT scan 11/08/2015.  FINDINGS: Rotator cuff: There is supraspinatus and infraspinatus tendinopathy without tear. The rotator cuff is otherwise  unremarkable.  Muscles: Normal without atrophy or focal lesion.  Biceps long head: Intact.  Acromioclavicular Joint: Moderately severe degenerative changes seen.  Glenohumeral Joint: Unremarkable.  Labrum: The posterior labrum is degenerated. No tear is seen.  Bones: There is no evidence of metastatic disease or multiple myeloma. Possible lesion in the humeral head seen on plain films may just be an area of mild demineralization. The acromion is type 1. There is no fracture or worrisome marrow lesion.  IMPRESSION: Negative for metastatic disease or multiple myeloma. No evidence of neoplastic process is present on the study.  Supraspinatus and infraspinatus tendinopathy without tear.  Moderately severe acromioclavicular osteoarthritis.   Electronically Signed  By: Inge Rise M.D.  On: 11/22/2015 12:51   I have personally reviewed the radiological images as listed and agreed with the findings in the report. Ct Abdomen Pelvis W Contrast  02/25/2016  CLINICAL DATA:  Intermittent abdominal pain beginning at 0900 hours. History of hepatocellular cancer, with recurrence. Status post ablation January 31, 2016. History of hypertension and diabetes. EXAM: CT ABDOMEN AND PELVIS WITH CONTRAST TECHNIQUE: Multidetector CT imaging of the abdomen and pelvis was performed using the standard protocol following bolus administration of intravenous contrast. CONTRAST:  155m ISOVUE-300 IOPAMIDOL (ISOVUE-300) INJECTION 61% COMPARISON:  CT abdomen and pelvis Dec 28, 2013 and CT hepatic ablation January 31, 2016. FINDINGS: LUNG BASES: Mild RIGHT lung base pleural thickening. Included heart size is normal. No pericardial effusions. SOLID ORGANS: Wedge-like hypodensity RIGHT lobe of the liver measuring 8.6 x 4 cm corresponding to treated metastasis, faint central density on early and delayed phase and punctate focus of gas. The spleen, gallbladder, pancreas and adrenal glands are unremarkable.  GASTROINTESTINAL TRACT: Long segment of circumferential small bowel wall thickening and edema with mesenteric fat stranding mid abdomen. The stomach, large bowel are normal in course and caliber without inflammatory changes. Normal appendix. KIDNEYS/ URINARY TRACT: Kidneys are orthotopic, demonstrating symmetric enhancement. No nephrolithiasis, hydronephrosis or solid renal masses. Too small to characterize hypodensities LEFT kidney. The unopacified ureters are normal in course and caliber. Delayed imaging through the kidneys demonstrates symmetric prompt contrast excretion within the proximal urinary collecting system. Urinary bladder is partially distended and unremarkable. PERITONEUM/RETROPERITONEUM: Aortoiliac vessels are normal in course and caliber, mild calcific atherosclerosis. No lymphadenopathy by CT size criteria. Stable 9 mm short axis portal caval lymph node. Prostate size is mildly enlarged. Moderate amount of amount of perihepatic, spot perisplenic and pelvic free fluid, 6 Hounsfield units. No focal fluid collection. No intraperitoneal free air. SOFT TISSUE/OSSEOUS STRUCTURES: Non-suspicious. 11 mm soft tissue nodule contiguous with the pubic symphysis most compatible with degenerative cyst. Small lipoma LEFT anterior compartment muscle. Multilevel moderate to severe facet arthropathy. IMPRESSION: Enteritis versus inflammatory bowel disease involving small bowel without complication. Moderate amount ascites is likely reactive. Status post ablation of RIGHT hepatic lobe mass with further necrosis. Electronically Signed   By: CElon AlasM.D.   On: 02/25/2016 23:47   Ct Guide Tissue Ablation  01/31/2016  CLINICAL DATA:  History of hepatocellular carcinoma with prior chemo embolization with doxorubicin loaded drug-eluting beads  on 03/06/2014. Development of new enhancing tumor superior to the previous treated carcinoma and consistent with hepatocellular carcinoma recurrence by MRI. The patient  presents for percutaneous ablation of the carcinoma recurrence. EXAM: CT-GUIDED PERCUTANEOUS THERMAL ABLATION OF LIVER COMPARISON:  MRI of the abdomen on 01/16/2016 ANESTHESIA/SEDATION: Anesthesia:  General Medications: 2 g IV Ancef. As antibiotic prophylaxis, Ancef was ordered pre-procedure and administered intravenously within one hour of incision. CONTRAST:  75 mL Isovue 370 IV PROCEDURE: The procedure, risks, benefits, and alternatives were explained to the patient. Questions regarding the procedure were encouraged and answered. The patient understands and consents to the procedure. A time-out was performed prior to the procedure. The patient was placed under general anesthesia. Initial unenhanced CT was performed in a supine position to localize the liver. The right abdominal wall was prepped with chlorhexidine in a sterile fashion, and a sterile drape was applied covering the operative field. A sterile gown and sterile gloves were used for the procedure. Arterial and venous phase CT was performed of the liver for localization purposes. Under CT guidance, 2 separate NeuWave PR XT probes were advanced into the liver. Thermal ablation was performed with a 10 minutes cycle at 65 watts. Ultrasound was used to monitor the ablation zone and CT was also performed during ablation. After ablation, both probes were removed has tract cautery was performed. COMPLICATIONS: None FINDINGS: With contrast administration, the nodular tumor recurrence superior to the previously treated carcinoma was well visualized. Ablation probe was placed through this nodule. Decision was made to also place another probe just inferior to the nodule in order to more fully treat the zone of tumor recurrence as well as extend the ablation zone into the previously treated carcinoma. IMPRESSION: CT guided percutaneous thermal ablation of nodular hepatocellular carcinoma recurrence. The patient will be observed overnight. Initial follow-up will be  performed in approximately 4 weeks. Electronically Signed   By: Aletta Edouard M.D.   On: 01/31/2016 16:24    ASSESSMENT & PLAN:   70 year old male with  #1 Monoclonal gammopathy of undetermined significance. M protein 0.2 mg/dL immunofixation showing IgG monoclonal protein with kappa light chain specificity. No overt anemia.  No overt hypercalcemia or renal failure. Skeletal survey showed about circumscribed lucency in the midshaft of the right radius measuring 4 x 11 mm.  There is also a prominent lucency in the left humeral head inferior to the greater tuberosity.    #2 bone lucencies in the right radial mid midshaft and left humeral head. PET/CT did not show any hypermetabolic bone lesions. MRI left shoulder showed no evidence of metastatic disease or multiple myeloma. The x-ray findings appear to be areas of mild demineralization. Patient reports that he was seen by orthopedics and no additional recommendations were given. Plan -Patient's mild anemia remains unchanged. No significant change in renal function. No new bone pains. -Myeloma labs from today are in process and are currently pending.  #3 Hepatocellular carcinoma treated with TACE and percutaneous thermal ablation by interventional radiology. Last MRI on 08/06/2015 showed slight decrease in the size of the ablation defect involving the right lobe of the liver area and no findings to suggest residual or recurrent hepatocellular carcinoma. Mild changes of liver cirrhosis. MRI of the abdomen with and without contrast on 01/16/2016 showed resolution of previously ablated lesion but a new 1.3 cm lesion was noted which was subsequently ablated under CT guidance by interventional radiology on 01/31/2016. Elevated transaminases on 02/01/2016 were likely related to his ablation. These have since resolved.  #  4 Hepatitis C genotype 1A status post treatment with Viekira and Ribavarin for 24 weeks.   Plan -continue f/u with IR for ongoing  management of [place].  -Continue follow-up and hepatitis C clinic for continued monitoring and management of his hepatitis C.  #5 Microcytosis with Minimal Anemia - possible thal trait given relative polycythemia. Some element of iron defi Iron sat 18%, ferritin 88 but could be over estimate in the setting of some liver injury Plan -PO iron supplementation Niferex 198m po daily  - we'll recheck ferritin and hemoglobin electrophoresis on follow-up in 6 months   Continue f/u with PCP, interventional radiology  and hepatitis C clinic.  RTC with Dr KIrene Limboin 6 months with cbc, cmp, myeloma panel, ferritin, hemoglobin electrophoresis.  . Orders Placed This Encounter  Procedures  . CBC & Diff and Retic    Standing Status: Future     Number of Occurrences:      Standing Expiration Date: 02/27/2017  . Comprehensive metabolic panel    Standing Status: Future     Number of Occurrences:      Standing Expiration Date: 02/27/2017  . Multiple Myeloma Panel (SPEP&IFE w/QIG)    Standing Status: Future     Number of Occurrences:      Standing Expiration Date: 02/27/2017  . Ferritin    Standing Status: Future     Number of Occurrences:      Standing Expiration Date: 02/27/2017  . AFP tumor marker    Standing Status: Future     Number of Occurrences:      Standing Expiration Date: 02/27/2017  . Hemoglobinopathy evaluation    Standing Status: Future     Number of Occurrences:      Standing Expiration Date: 02/27/2017    All of the patients questions were answered with apparent satisfaction. The patient knows to call the clinic with any problems, questions or concerns.  I spent 20 minutes counseling the patient face to face. The total time spent in the appointment was 25 minutes and more than 50% was on counseling and direct patient cares.    GSullivan LoneMD MNoblestownAAHIVMS SDana-Farber Cancer InstituteCLake District HospitalHematology/Oncology Physician CSaint [facility]aels [facility] (Office):       [phone](Work cell):  [phone](Fax):            [phone]

## 2016-03-03 LAB — PROTEIN ELECTROPHORESIS, SERUM
A/G Ratio: 0.9 (ref 0.7–1.7)
Albumin: 3.5 g/dL (ref 2.9–4.4)
Alpha 1: 0.3 g/dL (ref 0.0–0.4)
Alpha 2: 1 g/dL (ref 0.4–1.0)
Beta: 1.3 g/dL (ref 0.7–1.3)
Gamma Globulin: 1.3 g/dL (ref 0.4–1.8)
Globulin, Total: 3.9 g/dL (ref 2.2–3.9)
M-Spike, %: 0.3 g/dL — ABNORMAL HIGH
Total Protein: 7.4 g/dL (ref 6.0–8.5)

## 2016-03-04 ENCOUNTER — Ambulatory Visit: Payer: 59 | Admitting: Hematology

## 2016-03-04 ENCOUNTER — Other Ambulatory Visit: Payer: 59

## 2016-04-15 ENCOUNTER — Other Ambulatory Visit (HOSPITAL_COMMUNITY): Payer: Self-pay | Admitting: Diagnostic Radiology

## 2016-04-15 ENCOUNTER — Other Ambulatory Visit (HOSPITAL_COMMUNITY): Payer: Self-pay | Admitting: Interventional Radiology

## 2016-04-15 DIAGNOSIS — C220 Liver cell carcinoma: Secondary | ICD-10-CM

## 2016-05-13 ENCOUNTER — Other Ambulatory Visit: Payer: 59

## 2016-05-13 ENCOUNTER — Ambulatory Visit: Payer: 59 | Admitting: Hematology

## 2016-05-22 ENCOUNTER — Encounter: Payer: Self-pay | Admitting: Diagnostic Radiology

## 2016-05-26 ENCOUNTER — Encounter: Payer: Self-pay | Admitting: Diagnostic Radiology

## 2016-06-03 DIAGNOSIS — K7469 Other cirrhosis of liver: Secondary | ICD-10-CM | POA: Diagnosis not present

## 2016-06-03 DIAGNOSIS — C220 Liver cell carcinoma: Secondary | ICD-10-CM | POA: Diagnosis not present

## 2016-06-24 ENCOUNTER — Ambulatory Visit (HOSPITAL_COMMUNITY)
Admission: RE | Admit: 2016-06-24 | Discharge: 2016-06-24 | Disposition: A | Discharge status: Home / Self Care | Payer: 59 | Source: Ambulatory Visit | Attending: Interventional Radiology | Admitting: Interventional Radiology

## 2016-06-24 ENCOUNTER — Ambulatory Visit
Admission: RE | Admit: 2016-06-24 | Discharge: 2016-06-24 | Disposition: A | Discharge status: Home / Self Care | Payer: 59 | Source: Ambulatory Visit | Attending: Interventional Radiology | Admitting: Interventional Radiology

## 2016-06-24 DIAGNOSIS — C220 Liver cell carcinoma: Secondary | ICD-10-CM | POA: Diagnosis present

## 2016-06-24 HISTORY — PX: IR GENERIC HISTORICAL: IMG1180011

## 2016-06-24 LAB — POCT I-STAT CREATININE: Creatinine, Ser: 1.4 mg/dL — ABNORMAL HIGH (ref 0.61–1.24)

## 2016-06-24 MED ORDER — GADOXETATE DISODIUM 0.25 MMOL/ML IV SOLN
10.0000 mL | Freq: Once | INTRAVENOUS | Status: AC | PRN
  Administered 2016-06-24: 10 mL via INTRAVENOUS

## 2016-06-24 NOTE — Progress Notes (Signed)
Chief Complaint: Status post percutaneous thermal ablation of recurrent/residual hepatocellular carcinoma on 01/31/2016.  History of Present Illness: Aaron [name] is a 70 y.o. male status post thermal ablation of recurrent hepatocellular carcinoma on 01/31/2016 after original chemoembolization and ablation in 2015 by Dr. Anselm Pancoast. He did well after the procedure with an episode of enteritis in July that has resolved. Follow-up MRI was performed today. He has no abdominal complaints currently.  Past Medical History:  Diagnosis Date  . Anemia    hx of   . Arthritis   . Diabetes mellitus without complication ([place])   . Hepatitis    hepatitis C  . Hepatocellular carcinoma ([place]) 01/30/2014   Path  . Hyperlipidemia   . Hyperlipidemia   . Hypertension     Past Surgical History:  Procedure Laterality Date  . COLONOSCOPY    . IR GENERIC HISTORICAL  02/26/2016   IR RADIOLOGIST EVAL & MGMT 02/26/2016 Markus Daft, MD [facility] INTERV RAD  . IR GENERIC HISTORICAL  01/16/2016   IR RADIOLOGIST EVAL & MGMT 01/16/2016 Markus Daft, MD [facility] INTERV RAD  . POLYPECTOMY      Allergies: Review of patient's allergies indicates no known allergies.  Medications: Prior to Admission medications   Medication Sig Start Date End Date Taking? Authorizing Provider  ALPRAZolam Duanne Moron) 0.5 MG tablet Take 1 tablet (0.5 mg total) by mouth once. 01/01/16 12/31/16 Yes Markus Daft, MD  aspirin EC 81 MG tablet Take 81 mg by mouth every morning.    Yes Historical Provider, MD  cholecalciferol (VITAMIN D) 1000 UNITS tablet Take 1,000 Units by mouth every morning.    Yes Historical Provider, MD  Insulin Glargine (LANTUS SOLOSTAR) 100 UNIT/ML Solostar Pen Inject 31 Units into the skin every morning.    Yes Historical Provider, MD  lisinopril-hydrochlorothiazide (PRINZIDE,ZESTORETIC) 20-12.5 MG tablet Take 1 tablet by mouth daily.   Yes Historical Provider, MD  metFORMIN (GLUCOPHAGE) 1000 MG tablet Take 1,000 mg by mouth 2 (two) times  daily with a meal.   Yes Historical Provider, MD  Multiple Vitamin (MULTIVITAMIN WITH MINERALS) TABS tablet Take 1 tablet by mouth daily.   Yes Historical Provider, MD  simvastatin (ZOCOR) 40 MG tablet Take 20 mg by mouth every morning.    Yes Historical Provider, MD  sitaGLIPtin (JANUVIA) 100 MG tablet Take 100 mg by mouth daily. Take 1/2 tablet once daily.   Yes Historical Provider, MD  acetaminophen-codeine (TYLENOL #3) 300-30 MG tablet Take 1-2 tablets by mouth every 6 (six) hours as needed for moderate pain. Patient not taking: Reported on 06/24/2016 02/26/16   Domenic Moras, PA-C  bismuth subsalicylate (PEPTO BISMOL) 262 MG/15ML suspension Take 30 mLs by mouth every 6 (six) hours as needed for indigestion. Reported on 02/28/2016    Historical Provider, MD  calcium carbonate (TUMS - DOSED IN MG ELEMENTAL CALCIUM) 500 MG chewable tablet Chew 1 tablet by mouth daily. Reported on 02/28/2016    Historical Provider, MD  Hydrocodone-Acetaminophen 5-300 MG TABS Reported on 02/28/2016 02/06/16   Historical Provider, MD  insulin lispro (HUMALOG) 100 UNIT/ML injection Inject 3 Units into the skin daily before supper.    Historical Provider, MD  iron polysaccharides (NIFEREX) 150 MG capsule Take 1 capsule (150 mg total) by mouth daily. Patient not taking: Reported on 06/24/2016 02/28/16   Brunetta Genera, MD  ondansetron (ZOFRAN) 4 MG tablet Take 1 tablet (4 mg total) by mouth every 6 (six) hours. Patient not taking: Reported on 06/24/2016 02/26/16   Domenic Moras,  PA-C  oxyCODONE (OXY IR/ROXICODONE) 5 MG immediate release tablet Reported on 02/28/2016 02/01/16   Historical Provider, MD     No family history on file.  Social History   Social History  . Marital status: Married    Spouse name: N/A  . Number of children: N/A  . Years of education: N/A   Social History Main Topics  . Smoking status: Former Smoker    Quit date: 01/30/1994  . Smokeless tobacco: Never Used     Comment: stopped 25 yrs ago  . Alcohol use  No     Comment: stoped 30 years ago   . Drug use: No  . Sexual activity: Not Currently   Other Topics Concern  . Not on file   Social History Narrative  . No narrative on file    ECOG Status: 0 - Asymptomatic  Review of Systems: A 12 point ROS discussed and pertinent positives are indicated in the HPI above.  All other systems are negative.  Review of Systems  Constitutional: Negative.   Respiratory: Negative.   Cardiovascular: Negative.   Gastrointestinal: Negative.   Genitourinary: Negative.   Musculoskeletal: Negative.   Neurological: Negative.     Vital Signs: BP 133/76 (BP Location: Right Arm, Patient Position: Sitting, Cuff Size: Large)   Pulse 75   Temp 97.9 F (36.6 C) (Oral)   Resp 14   Ht 6' (1.829 m)   Wt 260 lb (117.9 kg)   SpO2 98%   BMI 35.26 kg/m   Physical Exam  Constitutional: He is oriented to person, place, and time. He appears well-developed and well-nourished. No distress.  Abdominal: Soft. He exhibits no distension and no mass. There is no tenderness. There is no rebound and no guarding.  Musculoskeletal: He exhibits no edema.  Neurological: He is alert and oriented to person, place, and time.  Skin: He is not diaphoretic.  Nursing note and vitals reviewed.    Imaging: [name]  Result Date: 06/24/2016 CLINICAL DATA:  Hepatitis-C, RIGHT hepatic lobe ablation three months prior. Thermal ablation. EXAM: MRI ABDOMEN WITHOUT AND WITH CONTRAST TECHNIQUE: Multiplanar multisequence MR imaging of the abdomen was performed both before and after the administration of intravenous contrast. CONTRAST:  10 mL Eovist COMPARISON:  MRI 01/16/2016, PET CT 11/08/2015 FINDINGS: Lower chest: Lung bases are clear. Hepatobiliary: Subcapsular ablation site in the RIGHT hepatic lobe measures 6.0 x 3.7 cm (image 44, series 502) measures contracted from 8.6 x 4.0 cm on CT 02/25/2016. Lesion has high signal intensity on precontrast T1 weighted imaging  (image 45, series 500) consistent with proteinaceous debris or blood product. No evidence of post-contrast enhancement. On the delayed 20 minutes EOVIST imaging, no new lesions identified. No biliary duct dilatation.  Gallbladder normal. Pancreas: No pancreatic duct dilatation. No pancreatic inflammation. Spleen:  Normal cyst Adrenals/Urinary Tract:  Adrenal glands and kidneys are normal. Stomach/Bowel: Stomach and limited view of the bowel is unremarkable. Vascular/Lymphatic:  No retroperitoneal periportal adenopathy. Other:  No ascites. Musculoskeletal: No aggressive osseous lesion IMPRESSION: 1. No evidence of recurrent hepatocellular carcinoma in the liver. 2. Mild contraction of RIGHT hepatic lobe subcapsular ablation site compared with CT 02/25/2016. Electronically Signed   By: Suzy Bouchard M.D.   On: 06/24/2016 09:34    Labs:  CBC:  Recent Labs  01/29/16 0830 02/01/16 0327 02/25/16 2042 02/28/16 0814  WBC 5.0 5.8 8.6 4.9  HGB 13.2 11.5* 12.6* 11.4*  HCT 41.1 35.8* 39.5 36.3*  PLT 254 213 295  235    COAGS:  Recent Labs  08/01/15 0845 01/29/16 0830  INR 1.03 0.98  APTT  --  34    BMP:  Recent Labs  01/13/16 0735  01/29/16 0830 02/01/16 0327 02/25/16 2042 02/28/16 0814 06/24/16 0732  NA 138  --  138 137 132* 138  --   K 4.4  --  4.9 4.4 4.8 5.0  --   CL 99  --  104 104 98*  --   --   CO2 25  --  _0 --   GLUCOSE 141*  --  132* 127* 156* 168*  --   BUN 10  --  24* 19 20 22.2  --   CALCIUM 9.2  --  9.7 8.7* 8.9 9.7  --   CREATININE 1.06  < > 1.25* 1.21 1.26* 1.4* 1.40*  GFRNONAA  --   --  57* 59* 57*  --   --   GFRAA  --   --  >60 >60 >60  --   --   < > = values in this interval not displayed.  LIVER FUNCTION TESTS:  Recent Labs  01/29/16 0830 02/01/16 0327 02/25/16 2042 02/28/16 0814 02/28/16 0814  BILITOT 0.8 1.0 0.9 0.44  --   AST 32 394* 17 26  --   ALT 28 242* 15* 17  --   ALKPHOS 35* 29* 41 51  --   PROT 8.4* 7.2 7.7 8.1 7.4    ALBUMIN 4.7 3.9 3.9 3.8  --     TUMOR MARKERS:  Recent Labs  01/13/16 0735  AFPTM 2.6    Assessment and Plan:  I met with Mr. Macke. We reviewed the follow-up MRI today which demonstrates an ablation defect encompassing the area of nodular recurrence along the superior margin of the originally treated hepatocellular carcinoma.  The size of the ablation defect shows some retraction since the CT on 02/25/2016. No new enhancing liver lesions are identified. I recommended follow-up MRI in 6 months. AFP has not been elevated in the past and is not a good marker for tumor recurrence for Mr. Canela. He is currently being followed by Dr. Irene Limbo for monoclonal gammopathy.   Electronically SignedAletta Edouard T 06/24/2016, 9:52 AM     I spent a total of 15 Minutes in face to face in clinical consultation, greater than 50% of which was counseling/coordinating care for hepatocellular carcinoma.

## 2016-07-14 ENCOUNTER — Encounter: Payer: Self-pay | Admitting: Interventional Radiology

## 2016-08-01 NOTE — Congregational Nurse Program (Signed)
Congregational Nurse Program Note  Date of Encounter: 07/19/2016  Past Medical History: Past Medical History:  Diagnosis Date  . Anemia    hx of   . Arthritis   . Diabetes mellitus without complication ([place])   . Hepatitis    hepatitis C  . Hepatocellular carcinoma ([place]) 01/30/2014   Path  . Hyperlipidemia   . Hyperlipidemia   . Hypertension     Encounter Details:     CNP Questionnaire - 07/19/16 2354      Patient Demographics   Is this a new or existing patient? Existing   Patient is considered a/an Not Applicable   Race African-American/Black     Patient Assistance   Location of Patient Assistance [facility]   Patient's financial/insurance status Medicare   Uninsured Patient ([place] [employer]) No   Patient referred to apply for the following financial assistance Not Applicable   Food insecurities addressed Not Applicable   Transportation assistance No   Assistance securing medications No   Educational health offerings Cancer;Spiritual care     Encounter Details   Primary purpose of visit Chronic Illness/Condition Visit;Post ED/Hospitalization Visit   Was an Emergency Department visit averted? Not Applicable   Does patient have a medical provider? Yes   Patient referred to Not Applicable   Was a mental health screening completed? (GAINS tool) No   Does patient have dental issues? No   Does patient have vision issues? No   Does your patient have an abnormal blood pressure today? No   Since previous encounter, have you referred patient for abnormal blood pressure that resulted in a new diagnosis or medication change? No   Does your patient have an abnormal blood glucose today? No   Since previous encounter, have you referred patient for abnormal blood glucose that resulted in a new diagnosis or medication change? No   Was there a life-saving intervention made? No     Client in need of flu vaccine.  Will get supply and provide asap.

## 2016-09-02 ENCOUNTER — Ambulatory Visit (HOSPITAL_BASED_OUTPATIENT_CLINIC_OR_DEPARTMENT_OTHER): Payer: 59 | Admitting: Hematology

## 2016-09-02 ENCOUNTER — Other Ambulatory Visit (HOSPITAL_BASED_OUTPATIENT_CLINIC_OR_DEPARTMENT_OTHER): Payer: 59

## 2016-09-02 ENCOUNTER — Encounter: Payer: Self-pay | Admitting: Hematology

## 2016-09-02 VITALS — BP 159/74 | HR 69 | Temp 97.8°F | Resp 20 | Ht 72.0 in | Wt 269.1 lb

## 2016-09-02 DIAGNOSIS — E119 Type 2 diabetes mellitus without complications: Secondary | ICD-10-CM

## 2016-09-02 DIAGNOSIS — C220 Liver cell carcinoma: Secondary | ICD-10-CM

## 2016-09-02 DIAGNOSIS — I10 Essential (primary) hypertension: Secondary | ICD-10-CM | POA: Diagnosis not present

## 2016-09-02 DIAGNOSIS — D472 Monoclonal gammopathy: Secondary | ICD-10-CM

## 2016-09-02 DIAGNOSIS — D509 Iron deficiency anemia, unspecified: Secondary | ICD-10-CM

## 2016-09-02 DIAGNOSIS — D649 Anemia, unspecified: Secondary | ICD-10-CM | POA: Diagnosis not present

## 2016-09-02 LAB — COMPREHENSIVE METABOLIC PANEL
ALT: 14 U/L (ref 0–55)
AST: 19 U/L (ref 5–34)
Albumin: 4 g/dL (ref 3.5–5.0)
Alkaline Phosphatase: 45 U/L (ref 40–150)
Anion Gap: 10 mEq/L (ref 3–11)
BUN: 23.5 mg/dL (ref 7.0–26.0)
CO2: 27 mEq/L (ref 22–29)
Calcium: 9.8 mg/dL (ref 8.4–10.4)
Chloride: 102 mEq/L (ref 98–109)
Creatinine: 1.4 mg/dL — ABNORMAL HIGH (ref 0.7–1.3)
EGFR: 59 mL/min/{1.73_m2} — ABNORMAL LOW (ref >90)
Glucose: 111 mg/dl (ref 70–140)
Potassium: 4.6 mEq/L (ref 3.5–5.1)
Sodium: 139 mEq/L (ref 136–145)
Total Bilirubin: 0.54 mg/dL (ref 0.20–1.20)
Total Protein: 8.1 g/dL (ref 6.4–8.3)

## 2016-09-02 LAB — CBC & DIFF AND RETIC
BASO%: 0.4 % (ref 0.0–2.0)
Basophils Absolute: 0 10*3/uL (ref 0.0–0.1)
EOS%: 2.7 % (ref 0.0–7.0)
Eosinophils Absolute: 0.1 10*3/uL (ref 0.0–0.5)
HCT: 39.2 % (ref 38.4–49.9)
HGB: 12.4 g/dL — ABNORMAL LOW (ref 13.0–17.1)
Immature Retic Fract: 14.1 % — ABNORMAL HIGH (ref 3.00–10.60)
LYMPH%: 37.3 % (ref 14.0–49.0)
MCH: 22.1 pg — ABNORMAL LOW (ref 27.2–33.4)
MCHC: 31.6 g/dL — ABNORMAL LOW (ref 32.0–36.0)
MCV: 70 fL — ABNORMAL LOW (ref 79.3–98.0)
MONO#: 0.5 10*3/uL (ref 0.1–0.9)
MONO%: 9.1 % (ref 0.0–14.0)
NEUT#: 2.7 10*3/uL (ref 1.5–6.5)
NEUT%: 50.5 % (ref 39.0–75.0)
Platelets: 220 10*3/uL (ref 140–400)
RBC: 5.6 10*6/uL (ref 4.20–5.82)
RDW: 15.2 % — ABNORMAL HIGH (ref 11.0–14.6)
Retic %: 1.2 % (ref 0.80–1.80)
Retic Ct Abs: 67.2 10*3/uL (ref 34.80–93.90)
WBC: 5.3 10*3/uL (ref 4.0–10.3)
lymph#: 2 10*3/uL (ref 0.9–3.3)
nRBC: 0 % (ref 0–0)

## 2016-09-02 LAB — FERRITIN: Ferritin: 77 ng/ml (ref 22–316)

## 2016-09-02 NOTE — Progress Notes (Signed)
[name] Kitchen    HEMATOLOGY/ONCOLOGY CLINIC NOTE  Date of Service: 09/02/2016  Patient Care Team: Jani Gravel, MD as PCP - General (Internal Medicine)  CHIEF COMPLAINTS/PURPOSE OF CONSULTATION:  MGUS HCC  HISTORY OF PRESENTING ILLNESS:  Aaron [name] is a wonderful 71 y.o. male who has been referred to [place] by Dr Jani Gravel for evaluation and management of monoclonal gammopathy of undetermined significance.  Patient has history of hypertension, diabetes, dyslipidemia, hepatocellular carcinoma (rx with TACE and percutaneous thermal ablation), hepatitis C status post treatment, iron deficiency anemia in 2014 treated with oral iron.  Patient had an SPEP done by his primary care physician on 10/04/2015 that showed an M spike of 0.3 g/dL. It was presumably will be done due to his complaints of fatigue. He has had no significant anemia. No new bone pains. No fevers/chills/drenching night sweats.  No new anemia..  Outside labs show hemoglobin of 12.4 with microcytosis with an MCV of 70.5, normal WBC count of 5.1k.  No evidence of hypercalcemia or significant renal failure on his outside labs.   INTERVAL HISTORY  Mr. Adduci is here for his scheduled follow-up for MGUS and HCC. He last had a MRI of the abdomen on 06/24/2016 which showed no evidence of recurrent hepatocellular carcinoma in the liver . Mild contraction of the right hepatic lobe supper capsular ablation site compared with CT on 03/06/2016 . He has a follow-up with interventional radiology with repeat MRI of the liver in May 2018 . He notes no new bone pain. Blood counts are been stable no other acute new concerns .  MlowEDICAL HISTORY:  Past Medical History:  Diagnosis Date  . Anemia    hx of   . Arthritis   . Diabetes mellitus without complication ([place])   . Hepatitis    hepatitis C  . Hepatocellular carcinoma ([place]) 01/30/2014   Path  . Hyperlipidemia   . Hyperlipidemia   . Hypertension   Obesity Hepatocellular carcinoma treated with  TACE and percutaneous thermal ablation by interventional radiology. Last MRI on 08/06/2015 showed slight decrease in the size of the ablation defect involving the right lobe of the liver area and no findings to suggest residual or recurrent hepatocellular carcinoma. Mild changes of liver cirrhosis. MRI Abd 06/24/2016- no evidence of recurrent HCC   Hepatitis C genotype 1A status post treatment with Viekira and Ribavarin for 24 weeks.  Monoclonal gammopathy of undetermined significance.  SURGICAL HISTORY:  Status post microwave ablation[October 2015] of liver lesion and TACE [July 2015] for [place]. EGD and colonoscopy in 2016   SOCIAL HISTORY: Social History   Social History  . Marital status: Married    Spouse name: N/A  . Number of children: N/A  . Years of education: N/A   Occupational History  . Not on file.   Social History Main Topics  . Smoking status: Former Smoker    Quit date: 01/30/1994  . Smokeless tobacco: Never Used     Comment: stopped 25 yrs ago  . Alcohol use No     Comment: stoped 30 years ago   . Drug use: No  . Sexual activity: Not Currently   Other Topics Concern  . Not on file   Social History Narrative  . No narrative on file  Former smoker and smoked 1 pack per day for about 20 years starting at age 64 quit 45 years ago.  FAMILY HISTORY: No family history on file.  ALLERGIES:  has No Known Allergies.  MEDICATIONS:  Current Outpatient Prescriptions  Medication Sig Dispense Refill  . acetaminophen-codeine (TYLENOL #3) 300-30 MG tablet Take 1-2 tablets by mouth every 6 (six) hours as needed for moderate pain. (Patient not taking: Reported on 06/24/2016) 15 tablet 0  . ALPRAZolam (XANAX) 0.5 MG tablet Take 1 tablet (0.5 mg total) by mouth once. 2 tablet 0  . aspirin EC 81 MG tablet Take 81 mg by mouth every morning.     . bismuth subsalicylate (PEPTO BISMOL) 262 MG/15ML suspension Take 30 mLs by mouth every 6 (six) hours as needed for indigestion.  Reported on 02/28/2016    . calcium carbonate (TUMS - DOSED IN MG ELEMENTAL CALCIUM) 500 MG chewable tablet Chew 1 tablet by mouth daily. Reported on 02/28/2016    . cholecalciferol (VITAMIN D) 1000 UNITS tablet Take 1,000 Units by mouth every morning.     [name] Kitchen Hydrocodone-Acetaminophen 5-300 MG TABS Reported on 02/28/2016    . Insulin Glargine (LANTUS SOLOSTAR) 100 UNIT/ML Solostar Pen Inject 31 Units into the skin every morning.     . insulin lispro (HUMALOG) 100 UNIT/ML injection Inject 3 Units into the skin daily before supper.    . iron polysaccharides (NIFEREX) 150 MG capsule Take 1 capsule (150 mg total) by mouth daily. (Patient not taking: Reported on 06/24/2016) 60 capsule 3  . lisinopril-hydrochlorothiazide (PRINZIDE,ZESTORETIC) 20-12.5 MG tablet Take 1 tablet by mouth daily.    . metFORMIN (GLUCOPHAGE) 1000 MG tablet Take 1,000 mg by mouth 2 (two) times daily with a meal.    . Multiple Vitamin (MULTIVITAMIN WITH MINERALS) TABS tablet Take 1 tablet by mouth daily.    . ondansetron (ZOFRAN) 4 MG tablet Take 1 tablet (4 mg total) by mouth every 6 (six) hours. (Patient not taking: Reported on 06/24/2016) 12 tablet 0  . oxyCODONE (OXY IR/ROXICODONE) 5 MG immediate release tablet Reported on 02/28/2016    . simvastatin (ZOCOR) 40 MG tablet Take 20 mg by mouth every morning.     . sitaGLIPtin (JANUVIA) 100 MG tablet Take 100 mg by mouth daily. Take 1/2 tablet once daily.     No current facility-administered medications for this visit.     REVIEW OF SYSTEMS:    10 Point review of Systems was done is negative except as noted above.  PHYSICAL EXAMINATION: ECOG PERFORMANCE STATUS: 2 - Symptomatic, <50% confined to bed  . Vitals:   09/02/16 0906  BP: (!) 159/74  Pulse: 69  Resp: 20  Temp: 97.8 F (36.6 C)   Filed Weights   09/02/16 0906  Weight: 269 lb 1.6 oz (122.1 kg)   .Body mass index is 36.5 kg/m.  GENERAL:alert, in no acute distress and comfortable SKIN: skin color, texture, turgor  are normal, no rashes or significant lesions EYES: normal, conjunctiva are pink and non-injected, sclera clear OROPHARYNX:no exudate, no erythema and lips, buccal mucosa, and tongue normal  NECK: supple, no JVD, thyroid normal size, non-tender, without nodularity LYMPH:  no palpable lymphadenopathy in the cervical, axillary or inguinal LUNGS: clear to auscultation with normal respiratory effort HEART: regular rate & rhythm,  no murmurs and no lower extremity edema ABDOMEN: abdomen soft, non-tender, normoactive bowel sounds , no palpable hepatosplenomegaly. Musculoskeletal: no cyanosis of digits and no clubbing  PSYCH: alert & oriented x 3 with fluent speech NEURO: no focal motor/sensory deficits  LABORATORY DATA:  I have reviewed the data as listed  . CBC Latest Ref Rng & Units 09/02/2016 02/28/2016 02/25/2016  WBC 4.0 - 10.3 10e3/uL 5.3 4.9 8.6  Hemoglobin 13.0 - 17.1 g/dL  12.4(L) 11.4(L) 12.6(L)  Hematocrit 38.4 - 49.9 % 39.2 36.3(L) 39.5  Platelets 140 - 400 10e3/uL 220 235 295   . CBC    Component Value Date/Time   WBC 5.3 09/02/2016 0854   WBC 8.6 02/25/2016 2042   RBC 5.60 09/02/2016 0854   RBC 5.70 02/25/2016 2042   HGB 12.4 (L) 09/02/2016 0854   HCT 39.2 09/02/2016 0854   PLT 220 09/02/2016 0854   MCV 70.0 (L) 09/02/2016 0854   MCH 22.1 (L) 09/02/2016 0854   MCH 22.1 (L) 02/25/2016 2042   MCHC 31.6 (L) 09/02/2016 0854   MCHC 31.9 02/25/2016 2042   RDW 15.2 (H) 09/02/2016 0854   LYMPHSABS 2.0 09/02/2016 0854   MONOABS 0.5 09/02/2016 0854   EOSABS 0.1 09/02/2016 0854   BASOSABS 0.0 09/02/2016 0854    . CMP Latest Ref Rng & Units 09/02/2016 06/24/2016 02/28/2016  Glucose 70 - 140 mg/dl 111 - 168(H)  BUN 7.0 - 26.0 mg/dL 23.5 - 22.2  Creatinine 0.7 - 1.3 mg/dL 1.4(H) 1.40(H) 1.4(H)  Sodium 136 - 145 mEq/L 139 - 138  Potassium 3.5 - 5.1 mEq/L 4.6 - 5.0  Chloride 101 - 111 mmol/L - - -  CO2 22 - 29 mEq/L 27 - 25  Calcium 8.4 - 10.4 mg/dL 9.8 - 9.7  Total Protein 6.4 -  8.3 g/dL 8.1 - 8.1  Total Bilirubin 0.20 - 1.20 mg/dL 0.54 - 0.44  Alkaline Phos 40 - 150 U/L 45 - 51  AST 5 - 34 U/L 19 - 26  ALT 0 - 55 U/L 14 - 17     Component     Latest Ref Rng 10/17/2015  IgG (Immunoglobin G), Serum     700 - 1600 mg/dL 1,273  IgA/Immunoglobulin A, Serum     61 - 437 mg/dL 235  IgM (Immunoglobin M), Srm     20 - 172 mg/dL 79  Total Protein     6.0 - 8.5 g/dL 7.5  Albumin SerPl Elph-Mcnc     2.9 - 4.4 g/dL 3.8  Alpha 1     0.0 - 0.4 g/dL 0.2  Alpha2 Glob SerPl Elph-Mcnc     0.4 - 1.0 g/dL 0.9  B-Globulin SerPl Elph-Mcnc     0.7 - 1.3 g/dL 1.2  Gamma Glob SerPl Elph-Mcnc     0.4 - 1.8 g/dL 1.4  M Protein SerPl Elph-Mcnc     Not Observed g/dL 0.2 (H)  Globulin, Total     2.2 - 3.9 g/dL 3.7  Albumin/Glob SerPl     0.7 - 1.7 1.1  IFE 1      Comment  Please Note (HCV):      Comment  Iron     42 - 163 ug/dL 65  TIBC     202 - 409 ug/dL 350  UIBC     117 - 376 ug/dL 286  %SAT     20 - 55 % 18 (L)  Beta 2     0.6 - 2.4 mg/L 2.2  LDH     125 - 245 U/L 177  Sed Rate     0 - 30 mm/hr 21  CRP     0.0 - 4.9 mg/L 2.8  Ferritin     22 - 316 ng/ml 88   IFE 1  Comment   Comments: Immunofixation shows IgG monoclonal protein with kappa light chain  specificity.           RADIOGRAPHIC STUDIES:  MR Abdomen W Wo  Contrast (Accession [id number]) (Order [id number])  Imaging  Date: 06/24/2016 Department: [facility] Released By: Abelardo Diesel Authorizing: Aletta Edouard, MD  Exam Information   Status Exam Begun  Exam Ended   Final [99] 06/24/2016 6:51 AM 06/24/2016 8:09 AM  PACS Images   Show images for [name]  Study Result   CLINICAL DATA:  Hepatitis-C, RIGHT hepatic lobe ablation three months prior. Thermal ablation.  EXAM: MRI ABDOMEN WITHOUT AND WITH CONTRAST  TECHNIQUE: Multiplanar multisequence MR imaging of the abdomen was performed both before and after the administration of  intravenous contrast.  CONTRAST:  10 mL Eovist  COMPARISON:  MRI 01/16/2016, PET CT 11/08/2015  FINDINGS: Lower chest: Lung bases are clear.  Hepatobiliary: Subcapsular ablation site in the RIGHT hepatic lobe measures 6.0 x 3.7 cm (image 44, series 502) measures contracted from 8.6 x 4.0 cm on CT 02/25/2016. Lesion has high signal intensity on precontrast T1 weighted imaging (image 45, series 500) consistent with proteinaceous debris or blood product. No evidence of post-contrast enhancement.  On the delayed 20 minutes EOVIST imaging, no new lesions identified.  No biliary duct dilatation.  Gallbladder normal.  Pancreas: No pancreatic duct dilatation. No pancreatic inflammation.  Spleen:  Normal cyst  Adrenals/Urinary Tract:  Adrenal glands and kidneys are normal.  Stomach/Bowel: Stomach and limited view of the bowel is unremarkable.  Vascular/Lymphatic:  No retroperitoneal periportal adenopathy.  Other:  No ascites.  Musculoskeletal: No aggressive osseous lesion  IMPRESSION: 1. No evidence of recurrent hepatocellular carcinoma in the liver. 2. Mild contraction of RIGHT hepatic lobe subcapsular ablation site compared with CT 02/25/2016.   Electronically Signed   By: Suzy Bouchard M.D.   On: 06/24/2016 09:34     ASSESSMENT & PLAN:   71 year old male with  #1 Monoclonal gammopathy of undetermined significance.  M protein 0.2 mg/dL immunofixation showing IgG monoclonal protein with kappa light chain specificity. SPEP 02/2016 - 0.3g/dl  Today patient has no overt anemia.  No overt hypercalcemia. Stable CKD creatinine 1.4.  #2 bone lucencies in the right radial mid midshaft and left humeral head. PET/CT did not show any hypermetabolic bone lesions. MRI left shoulder showed no evidence of metastatic disease or multiple myeloma. The x-ray findings appear to be areas of mild demineralization. Patient reports that he was seen by orthopedics and no  additional recommendations were given. Plan -Patient's anemia has improved. No significant change in renal function. No new bone pains. No hypercalcemia . No overall no overt evidence of progression of multiple myeloma. -Myeloma labs from today are in process and are currently pending.  #3 Hepatocellular carcinoma treated with TACE and percutaneous thermal ablation by interventional radiology. Last MRI on 08/06/2015 showed slight decrease in the size of the ablation defect involving the right lobe of the liver area and no findings to suggest residual or recurrent hepatocellular carcinoma. Mild changes of liver cirrhosis. MRI of the abdomen with and without contrast on 01/16/2016 showed resolution of previously ablated lesion but a new 1.3 cm lesion was noted which was subsequently ablated under CT guidance by interventional radiology on 01/31/2016. Elevated transaminases on 02/01/2016 were likely related to his ablation. These have since resolved.  MRI abdomen with and without contrast on 06/24/2016 shows no evidence of recurrent hepatocellular carcinoma . #4 Hepatitis C genotype 1A status post treatment with Viekira and Ribavarin for 24 weeks.   Plan -continue f/u with IR for ongoing management of [place].  has a follow-up with IR with  rpt MRI in May 2018  -Continue follow-up and hepatitis C clinic for continued monitoring and management of his hepatitis C.  #5 Microcytosis with Minimal Anemia - possible thal trait given relative polycythemia. Some element of iron defi Iron sat 18%, ferritin 88 but could be over estimate in the setting of some liver injury Ferritin level stable @ 77 today Plan -PO iron supplementation Niferex 13m po daily for maintenance for 647monthand then stop if ferritin close to 100 -pending hemoglobin electrophoresis from today  Continue f/u with PCP, interventional radiology  and hepatitis C clinic.  RTC with Dr KaIrene Limbon 6 months with cbc, cmp, myeloma panel  . Orders  Placed This Encounter  Procedures  . CBC & Diff and Retic    Standing Status:   Future    Standing Expiration Date:   09/02/2017  . Comprehensive metabolic panel    Standing Status:   Future    Standing Expiration Date:   09/02/2017  . Multiple Myeloma Panel (SPEP&IFE w/QIG)    Standing Status:   Future    Standing Expiration Date:   09/02/2017  . AFP tumor marker    Standing Status:   Future    Standing Expiration Date:   09/02/2017  . CEA    Standing Status:   Future    Standing Expiration Date:   09/02/2017    All of the patients questions were answered with apparent satisfaction. The patient knows to call the clinic with any problems, questions or concerns.  I spent 20 minutes counseling the patient face to face. The total time spent in the appointment was 25 minutes and more than 50% was on counseling and direct patient cares.    GaSullivan LoneD MSSeabrook BeachAHIVMS SCMnh Gi Surgical Center LLCTSt Josephs Surgery Centerematology/Oncology Physician CoTouchette Regional Hospital Inc(Office):       [phone] cell):  [phone]):           [phone]

## 2016-09-03 LAB — AFP TUMOR MARKER: AFP, Serum, Tumor Marker: 2 ng/mL (ref 0.0–8.3)

## 2016-09-04 LAB — MULTIPLE MYELOMA PANEL, SERUM
Albumin SerPl Elph-Mcnc: 4 g/dL (ref 2.9–4.4)
Albumin/Glob SerPl: 1.1 (ref 0.7–1.7)
Alpha 1: 0.3 g/dL (ref 0.0–0.4)
Alpha2 Glob SerPl Elph-Mcnc: 0.9 g/dL (ref 0.4–1.0)
B-Globulin SerPl Elph-Mcnc: 1.4 g/dL — ABNORMAL HIGH (ref 0.7–1.3)
Gamma Glob SerPl Elph-Mcnc: 1.4 g/dL (ref 0.4–1.8)
Globulin, Total: 3.9 g/dL (ref 2.2–3.9)
IgA, Qn, Serum: 232 mg/dL (ref 61–437)
IgG, Qn, Serum: 1316 mg/dL (ref 700–1600)
IgM, Qn, Serum: 78 mg/dL (ref 20–172)
M Protein SerPl Elph-Mcnc: 0.3 g/dL — ABNORMAL HIGH
Total Protein: 7.9 g/dL (ref 6.0–8.5)

## 2016-09-04 LAB — HEMOGLOBINOPATHY EVALUATION
HGB C: 0 %
HGB S: 0 %
HGB VARIANT: 0 %
Hemoglobin A2 Quantitation: 6.4 % — ABNORMAL HIGH (ref 1.8–3.2)
Hemoglobin F Quantitation: 0.8 % (ref 0.0–2.0)
Hgb A: 92.8 % — ABNORMAL LOW (ref 96.4–98.8)

## 2016-09-06 ENCOUNTER — Telehealth: Payer: Self-pay | Admitting: Hematology

## 2016-09-06 NOTE — Telephone Encounter (Signed)
Lvm advising appt 7/13 @ 9am. Also mailed calendar.

## 2016-11-14 DIAGNOSIS — H04123 Dry eye syndrome of bilateral lacrimal glands: Secondary | ICD-10-CM | POA: Diagnosis not present

## 2016-11-14 DIAGNOSIS — H40033 Anatomical narrow angle, bilateral: Secondary | ICD-10-CM | POA: Diagnosis not present

## 2016-11-24 DIAGNOSIS — C220 Liver cell carcinoma: Secondary | ICD-10-CM | POA: Diagnosis not present

## 2016-11-24 DIAGNOSIS — K7469 Other cirrhosis of liver: Secondary | ICD-10-CM | POA: Diagnosis not present

## 2016-12-09 ENCOUNTER — Other Ambulatory Visit (HOSPITAL_COMMUNITY): Payer: Self-pay | Admitting: Diagnostic Radiology

## 2016-12-10 ENCOUNTER — Other Ambulatory Visit (HOSPITAL_COMMUNITY): Payer: Self-pay | Admitting: Diagnostic Radiology

## 2016-12-10 ENCOUNTER — Other Ambulatory Visit: Payer: Self-pay | Admitting: Radiology

## 2016-12-10 DIAGNOSIS — C220 Liver cell carcinoma: Secondary | ICD-10-CM

## 2016-12-10 MED ORDER — ALPRAZOLAM 0.5 MG PO TABS: 1.0000 mg | ORAL_TABLET | ORAL | 0 refills | Status: DC

## 2016-12-17 ENCOUNTER — Other Ambulatory Visit (HOSPITAL_COMMUNITY): Payer: Self-pay | Admitting: Diagnostic Radiology

## 2016-12-17 ENCOUNTER — Ambulatory Visit (HOSPITAL_COMMUNITY)
Admission: RE | Admit: 2016-12-17 | Discharge: 2016-12-17 | Disposition: A | Discharge status: Home / Self Care | Payer: 59 | Source: Ambulatory Visit | Attending: Diagnostic Radiology | Admitting: Diagnostic Radiology

## 2016-12-17 ENCOUNTER — Ambulatory Visit
Admission: RE | Admit: 2016-12-17 | Discharge: 2016-12-17 | Disposition: A | Discharge status: Home / Self Care | Payer: 59 | Source: Ambulatory Visit | Attending: Diagnostic Radiology | Admitting: Diagnostic Radiology

## 2016-12-17 DIAGNOSIS — C220 Liver cell carcinoma: Secondary | ICD-10-CM

## 2016-12-17 DIAGNOSIS — Z8505 Personal history of malignant neoplasm of liver: Secondary | ICD-10-CM | POA: Diagnosis not present

## 2016-12-17 HISTORY — PX: IR RADIOLOGIST EVAL & MGMT: IMG5224

## 2016-12-17 LAB — POCT I-STAT CREATININE: Creatinine, Ser: 1.3 mg/dL — ABNORMAL HIGH (ref 0.61–1.24)

## 2016-12-17 MED ORDER — GADOXETATE DISODIUM 0.25 MMOL/ML IV SOLN
10.0000 mL | Freq: Once | INTRAVENOUS | Status: AC | PRN
  Administered 2016-12-17: 10 mL via INTRAVENOUS

## 2016-12-17 MED ORDER — GADOXETATE DISODIUM 0.25 MMOL/ML IV SOLN: 10.0000 mL | Freq: Once | INTRAVENOUS | Status: DC | PRN

## 2016-12-17 NOTE — Progress Notes (Signed)
Patient ID: Aaron [name], male   DOB: December 01, 1945, 71 y.o.   MRN: [id number]    Chief Complaint: The patient is seen in follow up today s/p percutaneous thermal ablation of recurrent/residual hepatocellular carcinoma on 01/31/2016.  History of present illness:  Aaron [name] is a 71 yo who is s/p thermal ablation for recurrent Aaron [name] on 01-31-16 by Dr. Kathlene Cote.  He has undergone a DEB-TACE followed by a [place] in 2015 by Dr. Anselm Pancoast.  The patient presents today for another 6 month follow up.  He has no complaints.  He is eating well, moving his bowels well, diabetes is under control, and he is following up with Dr. Irene Limbo for his MGUS.  He had a repeat MRI today prior to his visit.    Past Medical History:  Diagnosis Date  . Anemia    hx of   . Arthritis   . Diabetes mellitus without complication ([place])   . Hepatitis    hepatitis C  . Hepatocellular carcinoma (Aaron [name]) 01/30/2014   Path  . Hyperlipidemia   . Hyperlipidemia   . Hypertension     Past Surgical History:  Procedure Laterality Date  . COLONOSCOPY    . IR GENERIC HISTORICAL  02/26/2016   IR RADIOLOGIST EVAL & MGMT 02/26/2016 Markus Daft, MD [facility] INTERV RAD  . IR GENERIC HISTORICAL  01/16/2016   IR RADIOLOGIST EVAL & MGMT 01/16/2016 Markus Daft, MD [facility] INTERV RAD  . IR GENERIC HISTORICAL  06/24/2016   IR RADIOLOGIST EVAL & MGMT 06/24/2016 Aletta Edouard, MD [facility] INTERV RAD  . POLYPECTOMY      Allergies: Patient has no known allergies.  Medications: Prior to Admission medications   Medication Sig Start Date End Date Taking? Authorizing Provider  aspirin EC 81 MG tablet Take 81 mg by mouth every morning.    Yes Historical Provider, MD  cholecalciferol (VITAMIN D) 1000 UNITS tablet Take 1,000 Units by mouth every morning.    Yes Historical Provider, MD  Insulin Glargine (LANTUS SOLOSTAR) 100 UNIT/ML Solostar Pen Inject 31 Units into the skin every morning.    Yes Historical Provider, MD  lisinopril-hydrochlorothiazide (PRINZIDE,ZESTORETIC)  20-12.5 MG tablet Take 1 tablet by mouth daily.   Yes Historical Provider, MD  metFORMIN (GLUCOPHAGE) 1000 MG tablet Take 1,000 mg by mouth 2 (two) times daily with a meal.   Yes Historical Provider, MD  Multiple Vitamin (MULTIVITAMIN WITH MINERALS) TABS tablet Take 1 tablet by mouth daily.   Yes Historical Provider, MD  simvastatin (ZOCOR) 40 MG tablet Take 20 mg by mouth every morning.    Yes Historical Provider, MD  sitaGLIPtin (JANUVIA) 100 MG tablet Take 100 mg by mouth daily. Take 1/2 tablet once daily.   Yes Historical Provider, MD  acetaminophen-codeine (TYLENOL #3) 300-30 MG tablet Take 1-2 tablets by mouth every 6 (six) hours as needed for moderate pain. Patient not taking: Reported on 06/24/2016 02/26/16   Domenic Moras, PA-C  ALPRAZolam Duanne Moron) 0.5 MG tablet Take 1 tablet (0.5 mg total) by mouth once. Patient not taking: Reported on 12/17/2016 01/01/16 12/31/16  Markus Daft, MD  ALPRAZolam Duanne Moron) 0.5 MG tablet Take 2 tablets (1 mg total) by mouth as directed. Take 1 tablet (0.5 mg) by mouth as directed prior to MR.  May take an additional tablet if needed. Patient not taking: Reported on 12/17/2016 12/17/16   Markus Daft, MD  bismuth subsalicylate (PEPTO BISMOL) 262 MG/15ML suspension Take 30 mLs by mouth every 6 (six) hours as needed for indigestion. Reported on 02/28/2016  Historical Provider, MD  calcium carbonate (TUMS - DOSED IN MG ELEMENTAL CALCIUM) 500 MG chewable tablet Chew 1 tablet by mouth daily. Reported on 02/28/2016    Historical Provider, MD  Hydrocodone-Acetaminophen 5-300 MG TABS Reported on 02/28/2016 02/06/16   Historical Provider, MD  insulin lispro (HUMALOG) 100 UNIT/ML injection Inject 3 Units into the skin daily before supper.    Historical Provider, MD  iron polysaccharides (NIFEREX) 150 MG capsule Take 1 capsule (150 mg total) by mouth daily. Patient not taking: Reported on 06/24/2016 02/28/16   Brunetta Genera, MD  ondansetron (ZOFRAN) 4 MG tablet Take 1 tablet (4 mg total) by  mouth every 6 (six) hours. Patient not taking: Reported on 12/17/2016 02/26/16   Domenic Moras, PA-C  oxyCODONE (OXY IR/ROXICODONE) 5 MG immediate release tablet Reported on 02/28/2016 02/01/16   Historical Provider, MD     No family history on file.  Social History   Social History  . Marital status: Married    Spouse name: N/A  . Number of children: N/A  . Years of education: N/A   Social History Main Topics  . Smoking status: Former Smoker    Quit date: 01/30/1994  . Smokeless tobacco: Never Used     Comment: stopped 25 yrs ago  . Alcohol use No     Comment: stoped 30 years ago   . Drug use: No  . Sexual activity: Not Currently   Other Topics Concern  . Not on file   Social History Narrative  . No narrative on file     Vital Signs: BP 140/80 (BP Location: Left Arm, Patient Position: Sitting, Cuff Size: Large)   Pulse 90   Temp 98 F (36.7 C) (Oral)   Resp 15   Ht 6' (1.829 m)   Wt 270 lb (122.5 kg)   SpO2 98%   BMI 36.62 kg/m   Physical Exam Gen: Ple[place], WD, WN black male sitting in a chair in NAD Heart: regular rate and rhythm Lungs: CTAB Abd: soft, NT, ND, +BS Ext: palpable radial pulses bilateral  Imaging: Mr Abdomen Wwo Contrast  Result Date: 12/17/2016 CLINICAL DATA:  71 year old male with history of thermal ablation for recurrent hepatocellular carcinoma performed on 02/10/2016. Follow-up study. EXAM: MRI ABDOMEN WITHOUT AND WITH CONTRAST TECHNIQUE: Multiplanar multisequence MR imaging of the abdomen was performed both before and after the administration of intravenous contrast. CONTRAST:  10 mL of Eovist. COMPARISON:  MRI of the abdomen 06/24/2016. FINDINGS: Lower chest: Unremarkable. Hepatobiliary: The area of prior ablation in the right lobe of the liver appears very similar to the prior study. This region overall measures approximately 4.1 x 2.5 cm on today's examination (image 33 of series 600). This lesion is heterogeneous in signal intensity on T1 and T2  weighted images, predominantly T1 hyperintense, with central T2 hypointensity and peripheral T2 hyperintensity. On post gadolinium images there is no compelling region of hyperenhancement associated with this lesion on today's study. No new suspicious cystic or solid hepatic lesions are noted. No intra or extrahepatic biliary ductal dilatation. Gallbladder is normal in appearance. Pancreas: No pancreatic mass. No pancreatic ductal dilatation. No pancreatic or peripancreatic fluid or inflammatory changes. Spleen:  Unremarkable. Adrenals/Urinary Tract: Subcentimeter lesions in both kidneys demonstrate T1 hypointensity, T2 hyperintensity, and do not enhance, compatible with tiny simple cysts. No suspicious renal lesions. No hydroureteronephrosis in the visualized abdomen. Bilateral adrenal glands are normal in appearance. Stomach/Bowel: Visualized portions are unremarkable. Vascular/Lymphatic: No aneurysm identified in the visualized abdominal vasculature. No  lymphadenopathy noted in the visualized abdomen. Other: No significant volume of ascites noted in the visualized portions of the peritoneal cavity. Musculoskeletal: No aggressive appearing osseous lesions are noted in the visualized portions of the skeleton. IMPRESSION: 1. Postprocedural changes of thermal ablation again noted in the right lobe of the liver, without definitive evidence to suggest local recurrence of disease. No new hepatic lesions are noted. 2. Additional incidental findings, as above. Electronically Signed   By: Vinnie Langton M.D.   On: 12/17/2016 09:42    Labs:  CBC:  Recent Labs  02/01/16 0327 02/25/16 2042 02/28/16 0814 09/02/16 0854  WBC 5.8 8.6 4.9 5.3  HGB 11.5* 12.6* 11.4* 12.4*  HCT 35.8* 39.5 36.3* 39.2  PLT 213 295 235 220    COAGS:  Recent Labs  01/29/16 0830  INR 0.98  APTT 34    BMP:  Recent Labs  01/13/16 0735  01/29/16 0830 02/01/16 0327 02/25/16 2042 02/28/16 0814 06/24/16 0732  09/02/16 0854 12/17/16 0725  NA 138  --  138 137 132* 138  --  139  --   K 4.4  --  4.9 4.4 4.8 5.0  --  4.6  --   CL 99  --  104 104 98*  --   --   --   --   CO2 25  --  26 27 25 25   --  27  --   GLUCOSE 141*  --  132* 127* 156* 168*  --  111  --   BUN 10  --  24* 19 20 22.2  --  23.5  --   CALCIUM 9.2  --  9.7 8.7* 8.9 9.7  --  9.8  --   CREATININE 1.06  < > 1.25* 1.21 1.26* 1.4* 1.40* 1.4* 1.30*  GFRNONAA  --   --  57* 59* 57*  --   --   --   --   GFRAA  --   --  >60 >60 >60  --   --   --   --   < > = values in this interval not displayed.  LIVER FUNCTION TESTS:  Recent Labs  02/01/16 0327 02/25/16 2042 02/28/16 0814 02/28/16 0814 09/02/16 0854  BILITOT 1.0 0.9 0.44  --  0.54  AST 394* 17 26  --  19  ALT 242* 15* 17  --  14  ALKPHOS 29* 41 51  --  45  PROT 7.2 7.7 8.1 7.4 8.1  7.9  ALBUMIN 3.9 3.9 3.8  --  4.0    Assessment:  1. s/p percutaneous thermal ablation of recurrent/residual hepatocellular carcinoma on 01/31/2016.  Patient is doing very well.  His MRI shows no evidence of recurrence and postprocedural changes to the liver ablation site.  He is following up with his PCP for his diabetes and other medical problems.  He is following up with Dr. Irene Limbo in June for his MGUS.  We will have him follow up in 6 months with Dr. Anselm Pancoast for surveillance MRI of the abdomen with contrast and office visit.  Signed: Henreitta Cea 12/17/2016, 10:26 AM   Please refer to Dr. Moises Blood attestation of this note for management and plan.

## 2017-01-26 DIAGNOSIS — E119 Type 2 diabetes mellitus without complications: Secondary | ICD-10-CM | POA: Diagnosis not present

## 2017-01-26 DIAGNOSIS — Z125 Encounter for screening for malignant neoplasm of prostate: Secondary | ICD-10-CM | POA: Diagnosis not present

## 2017-01-26 DIAGNOSIS — I10 Essential (primary) hypertension: Secondary | ICD-10-CM | POA: Diagnosis not present

## 2017-01-27 DIAGNOSIS — D509 Iron deficiency anemia, unspecified: Secondary | ICD-10-CM | POA: Diagnosis not present

## 2017-02-05 ENCOUNTER — Encounter: Payer: Self-pay | Admitting: Diagnostic Radiology

## 2017-03-02 ENCOUNTER — Telehealth: Payer: Self-pay | Admitting: Hematology

## 2017-03-02 NOTE — Telephone Encounter (Signed)
lvm about appointment changes and a call back number °

## 2017-03-05 ENCOUNTER — Ambulatory Visit: Payer: 59 | Admitting: Hematology

## 2017-03-05 ENCOUNTER — Other Ambulatory Visit: Payer: 59

## 2017-03-22 ENCOUNTER — Other Ambulatory Visit (HOSPITAL_BASED_OUTPATIENT_CLINIC_OR_DEPARTMENT_OTHER): Payer: 59

## 2017-03-22 ENCOUNTER — Encounter: Payer: Self-pay | Admitting: Hematology

## 2017-03-22 ENCOUNTER — Ambulatory Visit (HOSPITAL_BASED_OUTPATIENT_CLINIC_OR_DEPARTMENT_OTHER): Payer: 59 | Admitting: Hematology

## 2017-03-22 VITALS — BP 152/76 | HR 70 | Temp 98.8°F | Resp 17 | Ht 72.0 in | Wt 273.7 lb

## 2017-03-22 DIAGNOSIS — Z8505 Personal history of malignant neoplasm of liver: Secondary | ICD-10-CM

## 2017-03-22 DIAGNOSIS — D509 Iron deficiency anemia, unspecified: Secondary | ICD-10-CM

## 2017-03-22 DIAGNOSIS — D472 Monoclonal gammopathy: Secondary | ICD-10-CM | POA: Diagnosis not present

## 2017-03-22 DIAGNOSIS — C220 Liver cell carcinoma: Secondary | ICD-10-CM

## 2017-03-22 DIAGNOSIS — B1920 Unspecified viral hepatitis C without hepatic coma: Secondary | ICD-10-CM

## 2017-03-22 LAB — CBC & DIFF AND RETIC
BASO%: 0.6 % (ref 0.0–2.0)
Basophils Absolute: 0 10*3/uL (ref 0.0–0.1)
EOS%: 2.2 % (ref 0.0–7.0)
Eosinophils Absolute: 0.1 10*3/uL (ref 0.0–0.5)
HCT: 37 % — ABNORMAL LOW (ref 38.4–49.9)
HGB: 11.5 g/dL — ABNORMAL LOW (ref 13.0–17.1)
Immature Retic Fract: 10.2 % (ref 3.00–10.60)
LYMPH%: 33.3 % (ref 14.0–49.0)
MCH: 22.5 pg — ABNORMAL LOW (ref 27.2–33.4)
MCHC: 31.1 g/dL — ABNORMAL LOW (ref 32.0–36.0)
MCV: 72.3 fL — ABNORMAL LOW (ref 79.3–98.0)
MONO#: 0.4 10*3/uL (ref 0.1–0.9)
MONO%: 7.9 % (ref 0.0–14.0)
NEUT#: 3 10*3/uL (ref 1.5–6.5)
NEUT%: 56 % (ref 39.0–75.0)
Platelets: 262 10*3/uL (ref 140–400)
RBC: 5.12 10*6/uL (ref 4.20–5.82)
RDW: 15.6 % — ABNORMAL HIGH (ref 11.0–14.6)
Retic %: 1.16 % (ref 0.80–1.80)
Retic Ct Abs: 59.39 10*3/uL (ref 34.80–93.90)
WBC: 5.3 10*3/uL (ref 4.0–10.3)
lymph#: 1.8 10*3/uL (ref 0.9–3.3)

## 2017-03-22 LAB — COMPREHENSIVE METABOLIC PANEL
ALT: 18 U/L (ref 0–55)
AST: 21 U/L (ref 5–34)
Albumin: 4 g/dL (ref 3.5–5.0)
Alkaline Phosphatase: 43 U/L (ref 40–150)
Anion Gap: 8 mEq/L (ref 3–11)
BUN: 22.8 mg/dL (ref 7.0–26.0)
CO2: 27 mEq/L (ref 22–29)
Calcium: 9.8 mg/dL (ref 8.4–10.4)
Chloride: 105 mEq/L (ref 98–109)
Creatinine: 1.6 mg/dL — ABNORMAL HIGH (ref 0.7–1.3)
EGFR: 51 mL/min/{1.73_m2} — ABNORMAL LOW (ref >90)
Glucose: 144 mg/dl — ABNORMAL HIGH (ref 70–140)
Potassium: 4.6 mEq/L (ref 3.5–5.1)
Sodium: 140 mEq/L (ref 136–145)
Total Bilirubin: 0.33 mg/dL (ref 0.20–1.20)
Total Protein: 8 g/dL (ref 6.4–8.3)

## 2017-03-22 LAB — CEA (IN HOUSE-CHCC): CEA (CHCC-In House): 1.22 ng/mL (ref 0.00–5.00)

## 2017-03-22 NOTE — Patient Instructions (Signed)
Thank you for choosing [facility] to provide your oncology and hematology care.  To afford each patient quality time with our providers, please arrive 30 minutes before your scheduled appointment time.  If you arrive late for your appointment, you may be asked to reschedule.  We strive to give you quality time with our providers, and arriving late affects you and other patients whose appointments are after yours.  If you are a no show for multiple scheduled visits, you may be dismissed from the clinic at the providers discretion.   Again, thank you for choosing [facility], our hope is that these requests will decrease the amount of time that you wait before being seen by our physicians.  ______________________________________________________________________ Should you have questions after your visit to the [facility], please contact our office at [phone] between the hours of 8:30 and 4:30 p.m.    Voicemails left after 4:30p.m will not be returned until the following business day.   For prescription refill requests, please have your pharmacy contact us directly.  Please also try to allow 48 hours for prescription requests.   Please contact the scheduling department for questions regarding scheduling.  For scheduling of procedures such as PET scans, CT scans, MRI, Ultrasound, etc please contact central scheduling at [phone].   Resources For Cancer Patients and Caregivers:  American Cancer Society:  800-227-2345  Can help patients locate various types of support and financial assistance Cancer Care: 1-800-813-HOPE (4673) Provides financial assistance, online support groups, medication/co-pay assistance.   [place] DSS:  336-641-3447 Where to apply for food stamps, Medicaid, and utility assistance Medicare Rights Center: 800-333-4114 Helps people with Medicare understand their rights and benefits, navigate the Medicare system, and secure the  quality healthcare they deserve SCAT: 336-333-6589 [place] [employer]'s shared-ride transportation service for eligible riders who have a disability that prevents them from riding the fixed route bus.   For additional information on assistance programs please contact our social worker:   Grier Hock/Abigail Elmore:  [phone] 

## 2017-03-22 NOTE — Progress Notes (Signed)
[name] Kitchen    HEMATOLOGY/ONCOLOGY CLINIC NOTE  Date of Service: 03/22/2017  Patient Care Team: Aaron Gravel, MD as PCP - General (Internal Medicine)  CHIEF COMPLAINTS/PURPOSE OF CONSULTATION:  MGUS HCC  HISTORY OF PRESENTING ILLNESS:  Aaron [name] is a wonderful 71 y.o. male who has been referred to [place] by Dr Aaron [name] for evaluation and management of monoclonal gammopathy of undetermined significance.  Patient has history of hypertension, diabetes, dyslipidemia, hepatocellular carcinoma (rx with TACE and percutaneous thermal ablation), hepatitis C status post treatment, iron deficiency anemia in 2014 treated with oral iron.  Patient had an SPEP done by his primary care physician on 10/04/2015 that showed an M spike of 0.3 g/dL. It was presumably will be done due to his complaints of fatigue. He has had no significant anemia. No new bone pains. No fevers/chills/drenching night sweats.  No new anemia..  Outside labs show hemoglobin of 12.4 with microcytosis with an MCV of 70.5, normal WBC count of 5.1k.  No evidence of hypercalcemia or significant renal failure on his outside labs.   INTERVAL HISTORY  Aaron [name] is here for his scheduled follow-up for MGUS and HCC. He last had a MRI of the abdomen on 12/17/2016 which showed no evidence of recurrent hepatocellular carcinoma in the liver. He continues to followup with Dr Markus Daft IR for this. He notes no new bone pain. Blood counts are been stable no other acute new concerns. He continues to have followup with ID for his Hep C followup. Notes chronic left knee pain which is unchanged.  MEDICAL HISTORY:  Past Medical History:  Diagnosis Date  . Anemia    hx of   . Arthritis   . Diabetes mellitus without complication ([place])   . Hepatitis    hepatitis C  . Hepatocellular carcinoma ([place]) 01/30/2014   Path  . Hyperlipidemia   . Hyperlipidemia   . Hypertension   Obesity Hepatocellular carcinoma treated with TACE and percutaneous thermal  ablation by interventional radiology. Last MRI on 08/06/2015 showed slight decrease in the size of the ablation defect involving the right lobe of the liver area and no findings to suggest residual or recurrent hepatocellular carcinoma. Mild changes of liver cirrhosis. MRI Abd 06/24/2016- no evidence of recurrent HCC   Hepatitis C genotype 1A status post treatment with Viekira and Ribavarin for 24 weeks.  Monoclonal gammopathy of undetermined significance.  SURGICAL HISTORY:  Status post microwave ablation[October 2015] of liver lesion and TACE [July 2015] for [place]. EGD and colonoscopy in 2016   SOCIAL HISTORY: Social History   Social History  . Marital status: Married    Spouse name: N/A  . Number of children: N/A  . Years of education: N/A   Occupational History  . Not on file.   Social History Main Topics  . Smoking status: Former Smoker    Quit date: 01/30/1994  . Smokeless tobacco: Never Used     Comment: stopped 25 yrs ago  . Alcohol use No     Comment: stoped 30 years ago   . Drug use: No  . Sexual activity: Not Currently   Other Topics Concern  . Not on file   Social History Narrative  . No narrative on file  Former smoker and smoked 1 pack per day for about 20 years starting at age 65 quit 58 years ago.  FAMILY HISTORY: History reviewed. No pertinent family history.  ALLERGIES:  has No Known Allergies.  MEDICATIONS:  Current Outpatient Prescriptions  Medication Sig Dispense  Refill  . aspirin EC 81 MG tablet Take 81 mg by mouth every morning.     . cholecalciferol (VITAMIN D) 1000 UNITS tablet Take 1,000 Units by mouth every morning.     . Insulin Glargine (LANTUS SOLOSTAR) 100 UNIT/ML Solostar Pen Inject 31 Units into the skin every morning.     . insulin lispro (HUMALOG) 100 UNIT/ML injection Inject 3 Units into the skin daily before supper.    [name] Kitchen lisinopril-hydrochlorothiazide (PRINZIDE,ZESTORETIC) 20-12.5 MG tablet Take 1 tablet by mouth daily.    .  metFORMIN (GLUCOPHAGE) 1000 MG tablet Take 1,000 mg by mouth 2 (two) times daily with a meal.    . simvastatin (ZOCOR) 40 MG tablet Take 20 mg by mouth every morning.     . sitaGLIPtin (JANUVIA) 100 MG tablet Take 100 mg by mouth daily. Take 1/2 tablet once daily.     No current facility-administered medications for this visit.     REVIEW OF SYSTEMS:    10 Point review of Systems was done is negative except as noted above.  PHYSICAL EXAMINATION: ECOG PERFORMANCE STATUS: 2 - Symptomatic, <50% confined to bed  . Vitals:   03/22/17 1418  BP: (!) 152/76  Pulse: 70  Resp: 17  Temp: 98.8 F (37.1 C)   Filed Weights   03/22/17 1418  Weight: 273 lb 11.2 oz (124.1 kg)   .Body mass index is 37.12 kg/m.  GENERAL:alert, in no acute distress and comfortable SKIN: skin color, texture, turgor are normal, no rashes or significant lesions EYES: normal, conjunctiva are pink and non-injected, sclera clear OROPHARYNX:no exudate, no erythema and lips, buccal mucosa, and tongue normal  NECK: supple, no JVD, thyroid normal size, non-tender, without nodularity LYMPH:  no palpable lymphadenopathy in the cervical, axillary or inguinal LUNGS: clear to auscultation with normal respiratory effort HEART: regular rate & rhythm,  no murmurs and no lower extremity edema ABDOMEN: abdomen soft, non-tender, normoactive bowel sounds , no palpable hepatosplenomegaly. Musculoskeletal: no cyanosis of digits and no clubbing  PSYCH: alert & oriented x 3 with fluent speech NEURO: no focal motor/sensory deficits  LABORATORY DATA:  I have reviewed the data as listed  . CBC Latest Ref Rng & Units 03/22/2017 09/02/2016 02/28/2016  WBC 4.0 - 10.3 10e3/uL 5.3 5.3 4.9  Hemoglobin 13.0 - 17.1 g/dL 11.5(L) 12.4(L) 11.4(L)  Hematocrit 38.4 - 49.9 % 37.0(L) 39.2 36.3(L)  Platelets 140 - 400 10e3/uL 262 220 235   . CBC    Component Value Date/Time   WBC 5.3 03/22/2017 1402   WBC 8.6 02/25/2016 2042   RBC 5.12  03/22/2017 1402   RBC 5.70 02/25/2016 2042   HGB 11.5 (L) 03/22/2017 1402   HCT 37.0 (L) 03/22/2017 1402   PLT 262 03/22/2017 1402   MCV 72.3 (L) 03/22/2017 1402   MCH 22.5 (L) 03/22/2017 1402   MCH 22.1 (L) 02/25/2016 2042   MCHC 31.1 (L) 03/22/2017 1402   MCHC 31.9 02/25/2016 2042   RDW 15.6 (H) 03/22/2017 1402   LYMPHSABS 1.8 03/22/2017 1402   MONOABS 0.4 03/22/2017 1402   EOSABS 0.1 03/22/2017 1402   BASOSABS 0.0 03/22/2017 1402    . CMP Latest Ref Rng & Units 03/22/2017 12/17/2016 09/02/2016  Glucose 70 - 140 mg/dl 144(H) - 111  BUN 7.0 - 26.0 mg/dL 22.8 - 23.5  Creatinine 0.7 - 1.3 mg/dL 1.6(H) 1.30(H) 1.4(H)  Sodium 136 - 145 mEq/L 140 - 139  Potassium 3.5 - 5.1 mEq/L 4.6 - 4.6  Chloride 101 - 111  mmol/L - - -  CO2 22 - 29 mEq/L 27 - 27  Calcium 8.4 - 10.4 mg/dL 9.8 - 9.8  Total Protein 6.4 - 8.3 g/dL 8.0 - 8.1  Total Bilirubin 0.20 - 1.20 mg/dL 0.33 - 0.54  Alkaline Phos 40 - 150 U/L 43 - 45  AST 5 - 34 U/L 21 - 19  ALT 0 - 55 U/L 18 - 14     Component     Latest Ref Rng 10/17/2015  IgG (Immunoglobin G), Serum     700 - 1600 mg/dL 1,273  IgA/Immunoglobulin A, Serum     61 - 437 mg/dL 235  IgM (Immunoglobin M), Srm     20 - 172 mg/dL 79  Total Protein     6.0 - 8.5 g/dL 7.5  Albumin SerPl Elph-Mcnc     2.9 - 4.4 g/dL 3.8  Alpha 1     0.0 - 0.4 g/dL 0.2  Alpha2 Glob SerPl Elph-Mcnc     0.4 - 1.0 g/dL 0.9  B-Globulin SerPl Elph-Mcnc     0.7 - 1.3 g/dL 1.2  Gamma Glob SerPl Elph-Mcnc     0.4 - 1.8 g/dL 1.4  M Protein SerPl Elph-Mcnc     Not Observed g/dL 0.2 (H)  Globulin, Total     2.2 - 3.9 g/dL 3.7  Albumin/Glob SerPl     0.7 - 1.7 1.1  IFE 1      Comment  Please Note (HCV):      Comment  Iron     42 - 163 ug/dL 65  TIBC     202 - 409 ug/dL 350  UIBC     117 - 376 ug/dL 286  %SAT     20 - 55 % 18 (L)  Beta 2     0.6 - 2.4 mg/L 2.2  LDH     125 - 245 U/L 177  Sed Rate     0 - 30 mm/hr 21  CRP     0.0 - 4.9 mg/L 2.8  Ferritin     22 -  316 ng/ml 88   IFE 1  Comment   Comments: Immunofixation shows IgG monoclonal protein with kappa light chain  specificity.           RADIOGRAPHIC STUDIES:  MRI abd w and wo contrast: 12/17/2016: IMPRESSION: 1. Postprocedural changes of thermal ablation again noted in the right lobe of the liver, without definitive evidence to suggest local recurrence of disease. No new hepatic lesions are noted. 2. Additional incidental findings, as above.   Electronically Signed   By: Vinnie Langton M.D.   On: 12/17/2016 09:42  ASSESSMENT & PLAN:   71 year old male with  #1 Monoclonal gammopathy of undetermined significance.  M protein 0.2 mg/dL immunofixation showing IgG monoclonal protein with kappa light chain specificity. SPEP 02/2016 - 0.3g/dl  Today patient has no overt anemia.  No overt hypercalcemia. Stable CKD creatinine 1.6  #2 bone lucencies in the right radial mid midshaft and left humeral head. PET/CT did not show any hypermetabolic bone lesions. MRI left shoulder showed no evidence of metastatic disease or multiple myeloma. The x-ray findings appear to be areas of mild demineralization. Patient was seen by orthopedics and no additional recommendations were given. Plan -Patient's anemia is stable. No significant change in renal function. No new bone pains. No hypercalcemia . No overall no overt evidence of progression of multiple myeloma. -Myeloma labs from today are in process and are currently pending.  #3  Hepatocellular carcinoma treated with TACE and percutaneous thermal ablation by interventional radiology. Last MRI on 08/06/2015 showed slight decrease in the size of the ablation defect involving the right lobe of the liver area and no findings to suggest residual or recurrent hepatocellular carcinoma. Mild changes of liver cirrhosis. MRI of the abdomen with and without contrast on 01/16/2016 showed resolution of previously ablated lesion but a new 1.3 cm lesion was noted  which was subsequently ablated under CT guidance by interventional radiology on 01/31/2016. Elevated transaminases on 02/01/2016 were likely related to his ablation. These have since resolved.  MRI abdomen with and without contrast on 06/24/2016 shows no evidence of recurrent hepatocellular carcinoma . MRI abdomen with and without contrast on 12/17/2016 shows no evidence of recurrent hepatocellular carcinoma . #4 Hepatitis C genotype 1A status post treatment with Viekira and Ribavarin for 24 weeks.   Plan -continue f/u with IR-Dr. Markus Daft for ongoing management of [place].   -f/u on pending AFP tumor marker from today. -Continue follow-up and hepatitis C clinic for continued monitoring and management of his hepatitis C.  #5 Microcytosis with Minimal Anemia - Hgb electrophoresis suggestive of Beta thal trait given relative polycythemia. Some element of iron defi Iron sat 18%, ferritin 88 but could be over estimate in the setting of some liver injury Ferritin level stable @ 77 today Plan -PO iron supplementation Niferex 1100m po daily for maintenance for 638monthand then stop if ferritin close to 100 -pending ferritin levels from today  Continue f/u with PCP, interventional radiology  and hepatitis C clinic.  RTC with Dr KaIrene Limbon 6 months with rpt labs  . Orders Placed This Encounter  Procedures  . CBC & Diff and Retic    Standing Status:   Future    Standing Expiration Date:   03/22/2018  . Comprehensive metabolic panel    Standing Status:   Future    Standing Expiration Date:   03/22/2018  . Ferritin    Standing Status:   Future    Standing Expiration Date:   03/22/2018  . Multiple Myeloma Panel (SPEP&IFE w/QIG)    Standing Status:   Future    Standing Expiration Date:   03/22/2018  . AFP tumor marker    Standing Status:   Future    Standing Expiration Date:   03/22/2018    All of the patients questions were answered with apparent satisfaction. The patient knows to call the clinic with  any problems, questions or concerns.  I spent 20 minutes counseling the patient face to face. The total time spent in the appointment was 25 minutes and more than 50% was on counseling and direct patient cares.    GaSullivan LoneD MSRaymondAHIVMS SCFirelands Regional Medical CenterTLoma Linda University Medical Center-Murrietaematology/Oncology Physician CoValley Forge [facility](Office):       [phone] cell):  [phone]):           [phone]

## 2017-03-23 ENCOUNTER — Telehealth: Payer: Self-pay | Admitting: Hematology

## 2017-03-23 LAB — AFP TUMOR MARKER: AFP, Serum, Tumor Marker: 1.4 ng/mL (ref 0.0–8.3)

## 2017-03-23 NOTE — Telephone Encounter (Signed)
Spoke with patient on the phone regarding his upcoming appointments in January.

## 2017-03-24 LAB — MULTIPLE MYELOMA PANEL, SERUM
Albumin SerPl Elph-Mcnc: 3.8 g/dL (ref 2.9–4.4)
Albumin/Glob SerPl: 1.1 (ref 0.7–1.7)
Alpha 1: 0.2 g/dL (ref 0.0–0.4)
Alpha2 Glob SerPl Elph-Mcnc: 0.9 g/dL (ref 0.4–1.0)
B-Globulin SerPl Elph-Mcnc: 1.2 g/dL (ref 0.7–1.3)
Gamma Glob SerPl Elph-Mcnc: 1.3 g/dL (ref 0.4–1.8)
Globulin, Total: 3.7 g/dL (ref 2.2–3.9)
IgA, Qn, Serum: 218 mg/dL (ref 61–437)
IgG, Qn, Serum: 1194 mg/dL (ref 700–1600)
IgM, Qn, Serum: 75 mg/dL (ref 20–172)
M Protein SerPl Elph-Mcnc: 0.3 g/dL — ABNORMAL HIGH
Total Protein: 7.5 g/dL (ref 6.0–8.5)

## 2017-05-14 ENCOUNTER — Other Ambulatory Visit (HOSPITAL_COMMUNITY): Payer: Self-pay | Admitting: Diagnostic Radiology

## 2017-05-14 DIAGNOSIS — C220 Liver cell carcinoma: Secondary | ICD-10-CM

## 2017-06-01 ENCOUNTER — Other Ambulatory Visit: Payer: Self-pay | Admitting: Radiology

## 2017-06-01 DIAGNOSIS — C220 Liver cell carcinoma: Secondary | ICD-10-CM

## 2017-06-02 ENCOUNTER — Other Ambulatory Visit: Payer: Self-pay | Admitting: Radiology

## 2017-06-10 ENCOUNTER — Encounter: Payer: Self-pay | Admitting: Hematology

## 2017-06-16 ENCOUNTER — Ambulatory Visit (HOSPITAL_COMMUNITY)
Admission: RE | Admit: 2017-06-16 | Discharge: 2017-06-16 | Disposition: A | Discharge status: Home / Self Care | Payer: 59 | Source: Ambulatory Visit | Attending: Diagnostic Radiology | Admitting: Diagnostic Radiology

## 2017-06-16 ENCOUNTER — Ambulatory Visit
Admission: RE | Admit: 2017-06-16 | Discharge: 2017-06-16 | Disposition: A | Discharge status: Home / Self Care | Payer: 59 | Source: Ambulatory Visit | Attending: Diagnostic Radiology | Admitting: Diagnostic Radiology

## 2017-06-16 ENCOUNTER — Other Ambulatory Visit: Payer: Self-pay | Admitting: *Deleted

## 2017-06-16 ENCOUNTER — Other Ambulatory Visit (HOSPITAL_COMMUNITY): Payer: Self-pay | Admitting: Diagnostic Radiology

## 2017-06-16 DIAGNOSIS — C220 Liver cell carcinoma: Secondary | ICD-10-CM

## 2017-06-16 DIAGNOSIS — K7689 Other specified diseases of liver: Secondary | ICD-10-CM | POA: Diagnosis not present

## 2017-06-16 DIAGNOSIS — K769 Liver disease, unspecified: Secondary | ICD-10-CM

## 2017-06-16 DIAGNOSIS — Z8505 Personal history of malignant neoplasm of liver: Secondary | ICD-10-CM | POA: Diagnosis not present

## 2017-06-16 HISTORY — PX: IR RADIOLOGIST EVAL & MGMT: IMG5224

## 2017-06-16 LAB — POCT I-STAT CREATININE: Creatinine, Ser: 1.4 mg/dL — ABNORMAL HIGH (ref 0.61–1.24)

## 2017-06-16 MED ORDER — GADOXETATE DISODIUM 0.25 MMOL/ML IV SOLN
10.0000 mL | Freq: Once | INTRAVENOUS | Status: AC | PRN
  Administered 2017-06-16: 10 mL via INTRAVENOUS

## 2017-06-16 NOTE — Consult Note (Signed)
Chief Complaint: Patient was seen in consultation today for follow-up of hepatocellular carcinoma and status post thermal ablation at the request of Anjali Manzella  Referring Physician(s): Alvina Chou   History of Present Illness: Aaron [name] is a 71 y.o. male with biopsy-proven hepatocellular carcinoma. The initial lesion was treated with DEB-TACE and microwave ablation in 2015. Follow-up imaging demonstrated a recurrence adjacent to the previous treatment site and this was treated with microwave ablation on 01/31/2016. Patient presents for his routine follow-up MRI and clinic visit. Patient is asymptomatic. No change in his appetite or weight. He remains active with outside activities and chores. He is followed by Dr.Kale for monoclonal gammopathy of undetermined significance. History of hepatitis C and treated with Linzie Collin and Ribavarin for 24 weeks.  Past Medical History:  Diagnosis Date  . Anemia    hx of   . Arthritis   . Diabetes mellitus without complication ([place])   . Hepatitis    hepatitis C  . Hepatocellular carcinoma ([place]) 01/30/2014   Path  . Hyperlipidemia   . Hyperlipidemia   . Hypertension     Past Surgical History:  Procedure Laterality Date  . COLONOSCOPY    . IR GENERIC HISTORICAL  02/26/2016   IR RADIOLOGIST EVAL & MGMT 02/26/2016 Markus Daft, MD [facility] INTERV RAD  . IR GENERIC HISTORICAL  01/16/2016   IR RADIOLOGIST EVAL & MGMT 01/16/2016 Markus Daft, MD [facility] INTERV RAD  . IR GENERIC HISTORICAL  06/24/2016   IR RADIOLOGIST EVAL & MGMT 06/24/2016 Aletta Edouard, MD [facility] INTERV RAD  . IR RADIOLOGIST EVAL & MGMT  12/17/2016  . POLYPECTOMY      Allergies: Patient has no known allergies.  Medications: Prior to Admission medications   Medication Sig Start Date End Date Taking? Authorizing Provider  aspirin EC 81 MG tablet Take 81 mg by mouth every morning.    Yes [provider]  cholecalciferol (VITAMIN D) 1000 UNITS tablet Take 1,000 Units  by mouth every morning.    Yes [provider]  Insulin Glargine (BASAGLAR KWIKPEN) 100 UNIT/ML SOPN Inject 32 Units into the skin daily after breakfast.   Yes [provider]  lisinopril-hydrochlorothiazide (PRINZIDE,ZESTORETIC) 20-12.5 MG tablet Take 1 tablet by mouth daily.   Yes [provider]  metFORMIN (GLUCOPHAGE) 1000 MG tablet Take 1,000 mg by mouth 2 (two) times daily with a meal.   Yes [provider]  simvastatin (ZOCOR) 40 MG tablet Take 20 mg by mouth every morning.    Yes [provider]  sitaGLIPtin (JANUVIA) 100 MG tablet Take 100 mg by mouth daily. Take 1/2 tablet once daily.   Yes [provider]     No family history on file.  Social History   Social History  . Marital status: Married    Spouse name: N/A  . Number of children: N/A  . Years of education: N/A   Social History Main Topics  . Smoking status: Former Smoker    Quit date: 01/30/1994  . Smokeless tobacco: Never Used     Comment: stopped 25 yrs ago  . Alcohol use No     Comment: stoped 30 years ago   . Drug use: No  . Sexual activity: Not Currently   Other Topics Concern  . Not on file   Social History Narrative  . No narrative on file      Review of Systems: A 12 point ROS discussed and pertinent positives are indicated in the HPI above.  All other systems are negative.  Review of Systems  Constitutional: Negative for activity change, appetite change and fatigue.  Respiratory: Negative.   Cardiovascular: Negative.   Gastrointestinal: Negative.   Genitourinary: Negative.   Musculoskeletal: Negative.   Neurological: Negative.     Vital Signs: BP (!) 142/85   Pulse 97   Resp 16   SpO2 99%   Physical Exam  Constitutional: No distress.  Cardiovascular: Normal rate, regular rhythm and normal heart sounds.   Pulmonary/Chest: Effort normal and breath sounds normal.  Abdominal: Soft. Bowel sounds are normal. He exhibits no distension.  There is no tenderness.  Musculoskeletal: He exhibits no edema.       Imaging: Mr Abdomen Wwo Contrast  Result Date: 06/16/2017 CLINICAL DATA:  Followup recurrent hepatocellular carcinoma. 16 months status post thermal ablation . EXAM: MRI ABDOMEN WITHOUT AND WITH CONTRAST TECHNIQUE: Multiplanar multisequence MR imaging of the abdomen was performed both before and after the administration of intravenous contrast. CONTRAST:  3mL EOVIST GADOXETATE DISODIUM 0.25 MOL/L IV SOLN COMPARISON:  12/17/2016 FINDINGS: Lower chest: No acute findings. Hepatobiliary: Ablation defect in the subcapsular posterior right hepatic lobe has decreased in size since prior study, currently measuring 3.5 x 2.1 cm on image 45/600 compared to 4.1 x 2.5 cm previously. A 1.3 cm mass is seen in segment 8 of the right lobe which shows T2 hyperintensity on image 24/5, and restricted diffusion. This shows mild hypervascular enhancement best demonstrated on subtraction imaging (image [id number]), and contrast washout delayed hepatobiliary phase imaging. No definite peripheral enhancement seen. This lesion was not seen on prior study. Gallbladder is unremarkable. No evidence of biliary ductal dilatation. Pancreas:  No mass or inflammatory changes. Spleen:  Within normal limits in size and appearance. Adrenals/Urinary Tract: No masses identified. No evidence of hydronephrosis. Stomach/Bowel: Visualized portions within the abdomen are unremarkable. Vascular/Lymphatic: No pathologically enlarged lymph nodes identified. No abdominal aortic aneurysm. Other:  None. Musculoskeletal:  No suspicious bone lesions identified. IMPRESSION: Decrease in size of the ablation defect in posterior right hepatic lobe, without evidence of residual or recurrent tumor at this site. New 1.3 cm mass in segment 8 of the right lobe, which is diagnostic of hepatocellular carcinoma by AASLD criteria, but due to small lesion size, does not meet criteria for  transplantation evaluation. No evidence of extrahepatic metastatic disease. Electronically Signed   By: Earle Gell M.D.   On: 06/16/2017 08:55    Labs:  CBC:  Recent Labs  09/02/16 0854 03/22/17 1402  WBC 5.3 5.3  HGB 12.4* 11.5*  HCT 39.2 37.0*  PLT 220 262    COAGS: No results for input(s): INR, APTT in the last 8760 hours.  BMP:  Recent Labs  09/02/16 0854 12/17/16 0725 03/22/17 1402 06/16/17 0738  NA 139  --  140  --   K 4.6  --  4.6  --   CO2 27  --  27  --   GLUCOSE 111  --  144*  --   BUN 23.5  --  22.8  --   CALCIUM 9.8  --  9.8  --   CREATININE 1.4* 1.30* 1.6* 1.40*    LIVER FUNCTION TESTS:  Recent Labs  09/02/16 0854 03/22/17 1402 03/22/17 1402  BILITOT 0.54  --  0.33  AST 19  --  21  ALT 14  --  18  ALKPHOS 45  --  43  PROT 8.1  7.9 7.5 8.0  ALBUMIN 4.0  --  4.0  TUMOR MARKERS: No results for input(s): AFPTM, CEA, CA199, CHROMGRNA in the last 8760 hours.  Assessment and Plan:  Recurrent hepatocellular carcinoma:  71 year old with history of hepatitis C and previously treated hepatocellular carcinoma. MRI imaging from today demonstrates a new 1.3 cm lesion in segment 8 of the liver. This is highly concerning for a new hepatocellular carcinoma. The previously treated areas show no evidence of local recurrence. I discussed these MRI findings with the patient. I gave him the option of treating this new lesion with thermal ablation or potentially following this new lesion closely. The risk of following this new lesion is that the lesion could enlarge and make treatment more difficult in the future. Patient has elected for percutaneous thermal ablation of the new lesion. I personally evaluated the liver with ultrasound while in the office. I was able to identify a 1.3 cm hypoechoic lesion at the area of concern. It is reassuring that we can identify this lesion with ultrasound because it may be difficult to identify on noncontrast CT imaging on the  day of treatment.. We discussed the percutaneous thermal ablation again. This will be the patient's third thermal ablation and he is very familiar with the procedure. He understands that he will need general anesthesia and one night in the hospital after the procedure. He has no questions or concerns.   We will repeat the AFP level and a complete metabolic panel in the interim. Schedule the patient for CT-guided thermal ablation of the new liver lesion.     Thank you for this interesting consult.  I greatly enjoyed meeting Mazen Marcin and look forward to participating in their care.  A copy of this report was sent to the requesting provider on this date.  Electronically Signed: Carylon Perches 06/16/2017, 11:43 AM   I spent a total of    25 Minutes in face to face in clinical consultation, greater than 50% of which was counseling/coordinating care for hepatocellular carcinoma.

## 2017-06-21 DIAGNOSIS — C220 Liver cell carcinoma: Secondary | ICD-10-CM | POA: Diagnosis not present

## 2017-06-21 DIAGNOSIS — K7469 Other cirrhosis of liver: Secondary | ICD-10-CM | POA: Diagnosis not present

## 2017-06-23 ENCOUNTER — Encounter: Payer: Self-pay | Admitting: Diagnostic Radiology

## 2017-07-09 ENCOUNTER — Other Ambulatory Visit: Payer: Self-pay | Admitting: General Surgery

## 2017-07-12 NOTE — Patient Instructions (Signed)
Kyrus Hyde  07/12/2017   Your procedure is scheduled on: 07/21/2017    Report to [facility] Main  Entrance Go to Radiology and check in at [place]  elevators to 3rd floor to  [place] at    [place] AM.    Call this number if you have problems the morning of surgery [phone]    Remember: ONLY 1 PERSON MAY GO WITH YOU TO SHORT STAY TO GET  READY MORNING OF [place].  Do not eat food or drink liquids :After Midnight.     Take these medicines the morning of surgery with A SIP OF WATER:  DO NOT TAKE ANY DIABETIC MEDICATIONS DAY OF YOUR SURGERY                               You may not have any metal on your body including hair pins and              piercings  Do not wear jewelry,  lotions, powders or perfumes, deodorant                          Men may shave face and neck.   Do not bring valuables to the hospital. [place].  Contacts, dentures or bridgework may not be worn into surgery.  Leave suitcase in the car. After surgery it may be brought to your room.       How to Manage Your Diabetes Before and After Surgery  Why is it important to control my blood sugar before and after surgery? . Improving blood sugar levels before and after surgery helps healing and can limit problems. . A way of improving blood sugar control is eating a healthy diet by: o  Eating less sugar and carbohydrates o  Increasing activity/exercise o  Talking with your doctor about reaching your blood sugar goals . High blood sugars (greater than 180 mg/dL) can raise your risk of infections and slow your recovery, so you will need to focus on controlling your diabetes during the weeks before surgery. . Make sure that the doctor who takes care of your diabetes knows about your planned surgery including the date and location.  How do I manage my blood sugar before surgery? . Check your blood sugar at  least 4 times a day, starting 2 days before surgery, to make sure that the level is not too high or low. o Check your blood sugar the morning of your surgery when you wake up and every 2 hours until you get to the Short Stay unit. . If your blood sugar is less than 70 mg/dL, you will need to treat for low blood sugar: o Do not take insulin. o Treat a low blood sugar (less than 70 mg/dL) with  cup of clear juice (cranberry or apple), 4 glucose tablets, OR glucose gel. o Recheck blood sugar in 15 minutes after treatment (to make sure it is greater than 70 mg/dL). If your blood sugar is not greater than 70 mg/dL on recheck, call [phone] for further instructions. . Report your blood sugar to the short stay nurse when you get to Short Stay.  . If you are admitted  to the hospital after surgery: o Your blood sugar will be checked by the staff and you will probably be given insulin after surgery (instead of oral diabetes medicines) to make sure you have good blood sugar levels. o The goal for blood sugar control after surgery is 80-180 mg/dL.   WHAT DO I DO ABOUT MY DIABETES MEDICATION?  [name] Kitchen Do not take oral diabetes medicines (pills) the morning of surgery.  .  No Insulin am of procedure.       . .  .   .  .   Patient Signature:  Date:   Nurse Signature:  Date:   Reviewed and Endorsed by [facility] Patient Education Committee, August 2015              Please read over the following fact sheets you were given: _____________________________________________________________________             [facility] - Preparing for Surgery Before surgery, you can play an important role.  Because skin is not sterile, your skin needs to be as free of germs as possible.  You can reduce the number of germs on your skin by washing with CHG (chlorahexidine gluconate) soap before surgery.  CHG is an antiseptic cleaner which kills germs and bonds with the skin to continue killing germs even after  washing. Please DO NOT use if you have an allergy to CHG or antibacterial soaps.  If your skin becomes reddened/irritated stop using the CHG and inform your nurse when you arrive at Short Stay. Do not shave (including legs and underarms) for at least 48 hours prior to the first CHG shower.  You may shave your face/neck. Please follow these instructions carefully:  1.  Shower with CHG Soap the night before surgery and the  morning of Surgery.  2.  If you choose to wash your hair, wash your hair first as usual with your  normal  shampoo.  3.  After you shampoo, rinse your hair and body thoroughly to remove the  shampoo.                           4.  Use CHG as you would any other liquid soap.  You can apply chg directly  to the skin and wash                       Gently with a scrungie or clean washcloth.  5.  Apply the CHG Soap to your body ONLY FROM THE NECK DOWN.   Do not use on face/ open                           Wound or open sores. Avoid contact with eyes, ears mouth and genitals (private parts).                       Wash face,  Genitals (private parts) with your normal soap.             6.  Wash thoroughly, paying special attention to the area where your surgery  will be performed.  7.  Thoroughly rinse your body with warm water from the neck down.  8.  DO NOT shower/wash with your normal soap after using and rinsing off  the CHG Soap.                9.  Pat yourself dry with a clean towel.            10.  Wear clean pajamas.            11.  Place clean sheets on your bed the night of your first shower and do not  sleep with pets. Day of Surgery : Do not apply any lotions/deodorants the morning of surgery.  Please wear clean clothes to the hospital/surgery center.  FAILURE TO FOLLOW THESE INSTRUCTIONS MAY RESULT IN THE CANCELLATION OF YOUR SURGERY PATIENT SIGNATURE_________________________________  NURSE  SIGNATURE__________________________________  ________________________________________________________________________  WHAT IS A BLOOD TRANSFUSION? Blood Transfusion Information  A transfusion is the replacement of blood or some of its parts. Blood is made up of multiple cells which provide different functions.  Red blood cells carry oxygen and are used for blood loss replacement.  White blood cells fight against infection.  Platelets control bleeding.  Plasma helps clot blood.  Other blood products are available for specialized needs, such as hemophilia or other clotting disorders. BEFORE THE TRANSFUSION  Who gives blood for transfusions?   Healthy volunteers who are fully evaluated to make sure their blood is safe. This is blood bank blood. Transfusion therapy is the safest it has ever been in the practice of medicine. Before blood is taken from a donor, a complete history is taken to make sure that person has no history of diseases nor engages in risky social behavior (examples are intravenous drug use or sexual activity with multiple partners). The donor's travel history is screened to minimize risk of transmitting infections, such as malaria. The donated blood is tested for signs of infectious diseases, such as HIV and hepatitis. The blood is then tested to be sure it is compatible with you in order to minimize the chance of a transfusion reaction. If you or a relative donates blood, this is often done in anticipation of surgery and is not appropriate for emergency situations. It takes many days to process the donated blood. RISKS AND COMPLICATIONS Although transfusion therapy is very safe and saves many lives, the main dangers of transfusion include:   Getting an infectious disease.  Developing a transfusion reaction. This is an allergic reaction to something in the blood you were given. Every precaution is taken to prevent this. The decision to have a blood transfusion has been  considered carefully by your caregiver before blood is given. Blood is not given unless the benefits outweigh the risks. AFTER THE TRANSFUSION  Right after receiving a blood transfusion, you will usually feel much better and more energetic. This is especially true if your red blood cells have gotten low (anemic). The transfusion raises the level of the red blood cells which carry oxygen, and this usually causes an energy increase.  The nurse administering the transfusion will monitor you carefully for complications. HOME CARE INSTRUCTIONS  No special instructions are needed after a transfusion. You may find your energy is better. Speak with your caregiver about any limitations on activity for underlying diseases you may have. SEEK MEDICAL CARE IF:   Your condition is not improving after your transfusion.  You develop redness or irritation at the intravenous (IV) site. SEEK IMMEDIATE MEDICAL CARE IF:  Any of the following symptoms occur over the next 12 hours:  Shaking chills.  You have a temperature by mouth above 102 F (38.9 C), not controlled by medicine.  Chest, back, or muscle pain.  People around you feel you are not acting correctly or are confused.  Shortness of breath or difficulty breathing.  Dizziness and fainting.  You get a rash or develop hives.  You have a decrease in urine output.  Your urine turns a dark color or changes to pink, red, or brown. Any of the following symptoms occur over the next 10 days:  You have a temperature by mouth above 102 F (38.9 C), not controlled by medicine.  Shortness of breath.  Weakness after normal activity.  The white part of the eye turns yellow (jaundice).  You have a decrease in the amount of urine or are urinating less often.  Your urine turns a dark color or changes to pink, red, or brown. Document Released: 08/07/2000 Document Revised: 11/02/2011 Document Reviewed: 03/26/2008 [facility] Patient Information 2014  [place], [place].  _______________________________________________________________________

## 2017-07-14 ENCOUNTER — Encounter (HOSPITAL_COMMUNITY)
Admission: RE | Admit: 2017-07-14 | Discharge: 2017-07-14 | Disposition: A | Discharge status: Home / Self Care | Payer: 59 | Source: Ambulatory Visit | Attending: Diagnostic Radiology | Admitting: Diagnostic Radiology

## 2017-07-14 ENCOUNTER — Encounter (HOSPITAL_COMMUNITY): Payer: Self-pay

## 2017-07-14 ENCOUNTER — Other Ambulatory Visit: Payer: Self-pay

## 2017-07-14 DIAGNOSIS — C220 Liver cell carcinoma: Secondary | ICD-10-CM | POA: Diagnosis not present

## 2017-07-14 DIAGNOSIS — Z01818 Encounter for other preprocedural examination: Secondary | ICD-10-CM | POA: Diagnosis not present

## 2017-07-14 DIAGNOSIS — E119 Type 2 diabetes mellitus without complications: Secondary | ICD-10-CM | POA: Insufficient documentation

## 2017-07-14 LAB — CBC WITH DIFFERENTIAL/PLATELET
Basophils Absolute: 0.1 10*3/uL (ref 0.0–0.1)
Basophils Relative: 1 %
Eosinophils Absolute: 0.1 10*3/uL (ref 0.0–0.7)
Eosinophils Relative: 2 %
HCT: 36 % — ABNORMAL LOW (ref 39.0–52.0)
Hemoglobin: 11.3 g/dL — ABNORMAL LOW (ref 13.0–17.0)
Lymphocytes Relative: 29 %
Lymphs Abs: 1.7 10*3/uL (ref 0.7–4.0)
MCH: 22.2 pg — ABNORMAL LOW (ref 26.0–34.0)
MCHC: 31.4 g/dL (ref 30.0–36.0)
MCV: 70.9 fL — ABNORMAL LOW (ref 78.0–100.0)
Monocytes Absolute: 0.4 10*3/uL (ref 0.1–1.0)
Monocytes Relative: 7 %
Neutro Abs: 3.6 10*3/uL (ref 1.7–7.7)
Neutrophils Relative %: 61 %
Platelets: 269 10*3/uL (ref 150–400)
RBC: 5.08 MIL/uL (ref 4.22–5.81)
RDW: 15 % (ref 11.5–15.5)
WBC: 5.9 10*3/uL (ref 4.0–10.5)

## 2017-07-14 LAB — PROTIME-INR
INR: 0.93
Prothrombin Time: 12.4 seconds (ref 11.4–15.2)

## 2017-07-14 LAB — COMPREHENSIVE METABOLIC PANEL
ALT: 18 U/L (ref 17–63)
AST: 26 U/L (ref 15–41)
Albumin: 4.1 g/dL (ref 3.5–5.0)
Alkaline Phosphatase: 40 U/L (ref 38–126)
Anion gap: 9 (ref 5–15)
BUN: 21 mg/dL — ABNORMAL HIGH (ref 6–20)
CO2: 24 mmol/L (ref 22–32)
Calcium: 9.5 mg/dL (ref 8.9–10.3)
Chloride: 105 mmol/L (ref 101–111)
Creatinine, Ser: 1.36 mg/dL — ABNORMAL HIGH (ref 0.61–1.24)
GFR calc Af Amer: 59 mL/min — ABNORMAL LOW (ref >60)
GFR calc non Af Amer: 51 mL/min — ABNORMAL LOW (ref >60)
Glucose, Bld: 140 mg/dL — ABNORMAL HIGH (ref 65–99)
Potassium: 4.3 mmol/L (ref 3.5–5.1)
Sodium: 138 mmol/L (ref 135–145)
Total Bilirubin: 0.7 mg/dL (ref 0.3–1.2)
Total Protein: 8 g/dL (ref 6.5–8.1)

## 2017-07-14 LAB — GLUCOSE, CAPILLARY: Glucose-Capillary: 144 mg/dL — ABNORMAL HIGH (ref 65–99)

## 2017-07-14 LAB — HEMOGLOBIN A1C
Hgb A1c MFr Bld: 7.8 % — ABNORMAL HIGH (ref 4.8–5.6)
Mean Plasma Glucose: 177.16 mg/dL

## 2017-07-14 NOTE — Progress Notes (Signed)
CMP done 07/14/17 faxed via epic to Dr Anselm Pancoast.

## 2017-07-14 NOTE — Progress Notes (Signed)
Final EKG done 07/14/17-epic

## 2017-07-14 NOTE — Progress Notes (Signed)
.......  07/10/1946  patient's date of birth is  Your patient has screened at an elevated risk for obstructive sleep apnea using the STOP-Bang tool during a presurgical visit. A score of 5 or greater is an elevated risk.

## 2017-07-16 ENCOUNTER — Other Ambulatory Visit: Payer: Self-pay | Admitting: Radiology

## 2017-07-20 ENCOUNTER — Other Ambulatory Visit: Payer: Self-pay | Admitting: Radiology

## 2017-07-21 ENCOUNTER — Encounter (HOSPITAL_COMMUNITY)
Admission: RE | Disposition: A | Discharge status: Home / Self Care | Payer: Self-pay | Source: Ambulatory Visit | Attending: Diagnostic Radiology

## 2017-07-21 ENCOUNTER — Ambulatory Visit (HOSPITAL_COMMUNITY): Payer: 59 | Admitting: Anesthesiology

## 2017-07-21 ENCOUNTER — Ambulatory Visit (HOSPITAL_COMMUNITY): Payer: 59

## 2017-07-21 ENCOUNTER — Encounter (HOSPITAL_COMMUNITY): Payer: Self-pay | Admitting: *Deleted

## 2017-07-21 ENCOUNTER — Encounter (HOSPITAL_COMMUNITY): Payer: Self-pay

## 2017-07-21 ENCOUNTER — Observation Stay (HOSPITAL_COMMUNITY)
Admission: RE | Admit: 2017-07-21 | Discharge: 2017-07-22 | Disposition: A | Discharge status: Home / Self Care | Payer: 59 | Source: Ambulatory Visit | Attending: Diagnostic Radiology | Admitting: Diagnostic Radiology

## 2017-07-21 ENCOUNTER — Ambulatory Visit (HOSPITAL_COMMUNITY)
Admission: RE | Admit: 2017-07-21 | Discharge: 2017-07-21 | Disposition: A | Discharge status: Home / Self Care | Payer: 59 | Source: Ambulatory Visit | Attending: Diagnostic Radiology | Admitting: Diagnostic Radiology

## 2017-07-21 ENCOUNTER — Other Ambulatory Visit: Payer: Self-pay

## 2017-07-21 DIAGNOSIS — E785 Hyperlipidemia, unspecified: Secondary | ICD-10-CM | POA: Insufficient documentation

## 2017-07-21 DIAGNOSIS — Z23 Encounter for immunization: Secondary | ICD-10-CM | POA: Insufficient documentation

## 2017-07-21 DIAGNOSIS — Z7982 Long term (current) use of aspirin: Secondary | ICD-10-CM | POA: Insufficient documentation

## 2017-07-21 DIAGNOSIS — Z79899 Other long term (current) drug therapy: Secondary | ICD-10-CM | POA: Diagnosis not present

## 2017-07-21 DIAGNOSIS — Z794 Long term (current) use of insulin: Secondary | ICD-10-CM | POA: Diagnosis not present

## 2017-07-21 DIAGNOSIS — C220 Liver cell carcinoma: Principal | ICD-10-CM | POA: Insufficient documentation

## 2017-07-21 DIAGNOSIS — Z6836 Body mass index (BMI) 36.0-36.9, adult: Secondary | ICD-10-CM | POA: Diagnosis not present

## 2017-07-21 DIAGNOSIS — I10 Essential (primary) hypertension: Secondary | ICD-10-CM | POA: Insufficient documentation

## 2017-07-21 DIAGNOSIS — Z8619 Personal history of other infectious and parasitic diseases: Secondary | ICD-10-CM | POA: Diagnosis not present

## 2017-07-21 DIAGNOSIS — E119 Type 2 diabetes mellitus without complications: Secondary | ICD-10-CM | POA: Insufficient documentation

## 2017-07-21 DIAGNOSIS — Z01818 Encounter for other preprocedural examination: Secondary | ICD-10-CM

## 2017-07-21 DIAGNOSIS — Z87891 Personal history of nicotine dependence: Secondary | ICD-10-CM | POA: Diagnosis not present

## 2017-07-21 HISTORY — PX: RADIOFREQUENCY ABLATION: SHX2290

## 2017-07-21 LAB — COMPREHENSIVE METABOLIC PANEL
ALT: 16 U/L — ABNORMAL LOW (ref 17–63)
AST: 22 U/L (ref 15–41)
Albumin: 4.4 g/dL (ref 3.5–5.0)
Alkaline Phosphatase: 41 U/L (ref 38–126)
Anion gap: 10 (ref 5–15)
BUN: 26 mg/dL — ABNORMAL HIGH (ref 6–20)
CO2: 25 mmol/L (ref 22–32)
Calcium: 9.6 mg/dL (ref 8.9–10.3)
Chloride: 102 mmol/L (ref 101–111)
Creatinine, Ser: 1.38 mg/dL — ABNORMAL HIGH (ref 0.61–1.24)
GFR calc Af Amer: 58 mL/min — ABNORMAL LOW (ref >60)
GFR calc non Af Amer: 50 mL/min — ABNORMAL LOW (ref >60)
Glucose, Bld: 145 mg/dL — ABNORMAL HIGH (ref 65–99)
Potassium: 4.5 mmol/L (ref 3.5–5.1)
Sodium: 137 mmol/L (ref 135–145)
Total Bilirubin: 0.5 mg/dL (ref 0.3–1.2)
Total Protein: 8.2 g/dL — ABNORMAL HIGH (ref 6.5–8.1)

## 2017-07-21 LAB — TYPE AND SCREEN
ABO/RH(D): B POS
Antibody Screen: NEGATIVE

## 2017-07-21 LAB — GLUCOSE, CAPILLARY
Glucose-Capillary: 127 mg/dL — ABNORMAL HIGH (ref 65–99)
Glucose-Capillary: 127 mg/dL — ABNORMAL HIGH (ref 65–99)
Glucose-Capillary: 327 mg/dL — ABNORMAL HIGH (ref 65–99)

## 2017-07-21 SURGERY — RADIO FREQUENCY ABLATION
Anesthesia: General

## 2017-07-21 MED ORDER — OXYCODONE HCL 5 MG/5ML PO SOLN
5.0000 mg | Freq: Once | ORAL | Status: DC | PRN
  Filled 2017-07-21: qty 5

## 2017-07-21 MED ORDER — FENTANYL CITRATE (PF) 100 MCG/2ML IJ SOLN
INTRAMUSCULAR | Status: DC | PRN
  Administered 2017-07-21 (×5): 50 ug via INTRAVENOUS

## 2017-07-21 MED ORDER — ADULT MULTIVITAMIN W/MINERALS CH
1.0000 | ORAL_TABLET | Freq: Every day | ORAL | Status: DC
  Administered 2017-07-21 – 2017-07-22 (×2): 1 via ORAL
  Filled 2017-07-21 (×2): qty 1

## 2017-07-21 MED ORDER — INSULIN GLARGINE 100 UNIT/ML ~~LOC~~ SOLN
32.0000 [IU] | Freq: Every day | SUBCUTANEOUS | Status: DC
  Filled 2017-07-21: qty 0.32

## 2017-07-21 MED ORDER — PIPERACILLIN-TAZOBACTAM 3.375 G IVPB
3.3750 g | Freq: Once | INTRAVENOUS | Status: AC
  Administered 2017-07-21: 3.375 g via INTRAVENOUS
  Filled 2017-07-21: qty 50

## 2017-07-21 MED ORDER — LISINOPRIL-HYDROCHLOROTHIAZIDE 20-12.5 MG PO TABS: 1.0000 | ORAL_TABLET | Freq: Every day | ORAL | Status: DC

## 2017-07-21 MED ORDER — INFLUENZA VAC SPLIT HIGH-DOSE 0.5 ML IM SUSY
0.5000 mL | PREFILLED_SYRINGE | INTRAMUSCULAR | Status: AC
  Administered 2017-07-22: 0.5 mL via INTRAMUSCULAR
  Filled 2017-07-21: qty 0.5

## 2017-07-21 MED ORDER — LINAGLIPTIN 5 MG PO TABS
5.0000 mg | ORAL_TABLET | Freq: Every day | ORAL | Status: DC
  Administered 2017-07-22: 5 mg via ORAL
  Filled 2017-07-21: qty 1

## 2017-07-21 MED ORDER — HYDROMORPHONE HCL 1 MG/ML IJ SOLN: 0.2500 mg | INTRAMUSCULAR | Status: DC | PRN

## 2017-07-21 MED ORDER — ROCURONIUM BROMIDE 10 MG/ML (PF) SYRINGE
PREFILLED_SYRINGE | INTRAVENOUS | Status: DC | PRN
  Administered 2017-07-21 (×2): 20 mg via INTRAVENOUS
  Administered 2017-07-21: 10 mg via INTRAVENOUS
  Administered 2017-07-21: 50 mg via INTRAVENOUS

## 2017-07-21 MED ORDER — BASAGLAR KWIKPEN 100 UNIT/ML ~~LOC~~ SOPN: 32.0000 [IU] | PEN_INJECTOR | Freq: Every day | SUBCUTANEOUS | Status: DC

## 2017-07-21 MED ORDER — DOCUSATE SODIUM 100 MG PO CAPS
100.0000 mg | ORAL_CAPSULE | Freq: Two times a day (BID) | ORAL | Status: DC
  Administered 2017-07-21: 100 mg via ORAL
  Filled 2017-07-21 (×2): qty 1

## 2017-07-21 MED ORDER — ONDANSETRON HCL 4 MG/2ML IJ SOLN
INTRAMUSCULAR | Status: DC | PRN
  Administered 2017-07-21: 4 mg via INTRAVENOUS

## 2017-07-21 MED ORDER — SUGAMMADEX SODIUM 500 MG/5ML IV SOLN
INTRAVENOUS | Status: DC | PRN
  Administered 2017-07-21: 400 mg via INTRAVENOUS

## 2017-07-21 MED ORDER — HYDROCHLOROTHIAZIDE 12.5 MG PO CAPS
12.5000 mg | ORAL_CAPSULE | Freq: Every day | ORAL | Status: DC
  Administered 2017-07-22: 12.5 mg via ORAL
  Filled 2017-07-21: qty 1

## 2017-07-21 MED ORDER — OXYCODONE HCL 5 MG PO TABS: 5.0000 mg | ORAL_TABLET | ORAL | Status: DC | PRN

## 2017-07-21 MED ORDER — FENTANYL CITRATE (PF) 250 MCG/5ML IJ SOLN
INTRAMUSCULAR | Status: AC
  Filled 2017-07-21: qty 5

## 2017-07-21 MED ORDER — MENTHOL 3 MG MT LOZG
1.0000 | LOZENGE | OROMUCOSAL | Status: DC | PRN
  Administered 2017-07-21: 3 mg via ORAL
  Filled 2017-07-21 (×2): qty 9

## 2017-07-21 MED ORDER — LIDOCAINE 2% (20 MG/ML) 5 ML SYRINGE
INTRAMUSCULAR | Status: DC | PRN
  Administered 2017-07-21: 80 mg via INTRAVENOUS

## 2017-07-21 MED ORDER — MIDAZOLAM HCL 2 MG/2ML IJ SOLN
INTRAMUSCULAR | Status: AC
  Filled 2017-07-21: qty 2

## 2017-07-21 MED ORDER — ONDANSETRON HCL 4 MG/2ML IJ SOLN: 4.0000 mg | Freq: Four times a day (QID) | INTRAMUSCULAR | Status: DC | PRN

## 2017-07-21 MED ORDER — LACTATED RINGERS IV SOLN
INTRAVENOUS | Status: DC
  Administered 2017-07-21 (×2): via INTRAVENOUS

## 2017-07-21 MED ORDER — PROPOFOL 10 MG/ML IV BOLUS
INTRAVENOUS | Status: DC | PRN
  Administered 2017-07-21: 200 mg via INTRAVENOUS

## 2017-07-21 MED ORDER — METFORMIN HCL 500 MG PO TABS
1000.0000 mg | ORAL_TABLET | Freq: Two times a day (BID) | ORAL | Status: DC
  Administered 2017-07-22: 1000 mg via ORAL
  Filled 2017-07-21: qty 2

## 2017-07-21 MED ORDER — MIDAZOLAM HCL 5 MG/5ML IJ SOLN
INTRAMUSCULAR | Status: DC | PRN
  Administered 2017-07-21: 2 mg via INTRAVENOUS

## 2017-07-21 MED ORDER — OXYCODONE HCL 5 MG PO TABS: 5.0000 mg | ORAL_TABLET | Freq: Once | ORAL | Status: DC | PRN

## 2017-07-21 MED ORDER — PHENYLEPHRINE 40 MCG/ML (10ML) SYRINGE FOR IV PUSH (FOR BLOOD PRESSURE SUPPORT)
PREFILLED_SYRINGE | INTRAVENOUS | Status: DC | PRN
  Administered 2017-07-21 (×4): 80 ug via INTRAVENOUS

## 2017-07-21 MED ORDER — DEXAMETHASONE SODIUM PHOSPHATE 10 MG/ML IJ SOLN
INTRAMUSCULAR | Status: DC | PRN
  Administered 2017-07-21: 6 mg via INTRAVENOUS

## 2017-07-21 MED ORDER — LISINOPRIL 20 MG PO TABS
20.0000 mg | ORAL_TABLET | Freq: Every day | ORAL | Status: DC
  Administered 2017-07-22: 20 mg via ORAL
  Filled 2017-07-21: qty 1

## 2017-07-21 NOTE — Anesthesia Preprocedure Evaluation (Addendum)
Anesthesia Evaluation  Patient identified by MRN, date of birth, ID band Patient awake    Reviewed: Allergy & Precautions, NPO status , Patient's Chart, lab work & pertinent test results  History of Anesthesia Complications Negative for: history of anesthetic complications  Airway Mallampati: II  TM Distance: >3 FB Neck ROM: Full    Dental  (+) Edentulous Upper, Edentulous Lower   Pulmonary neg pulmonary ROS, former smoker,    breath sounds clear to auscultation       Cardiovascular hypertension,  Rhythm:Regular Rate:Normal     Neuro/Psych    GI/Hepatic (+) Hepatitis -Liver ca   Endo/Other  diabetes, Well Controlled, Insulin DependentMorbid obesity  Renal/GU      Musculoskeletal   Abdominal (+) + obese,   Peds  Hematology   Anesthesia Other Findings   Reproductive/Obstetrics                            Anesthesia Physical Anesthesia Plan  ASA: III  Anesthesia Plan: General   Post-op Pain Management:    Induction: Intravenous  PONV Risk Score and Plan: 3 and Ondansetron, Dexamethasone and Treatment may vary due to age or medical condition  Airway Management Planned: Oral ETT  Additional Equipment:   Intra-op Plan:   Post-operative Plan: Extubation in OR  Informed Consent: I have reviewed the patients History and Physical, chart, labs and discussed the procedure including the risks, benefits and alternatives for the proposed anesthesia with the patient or authorized representative who has indicated his/her understanding and acceptance.   Dental advisory given  Plan Discussed with: CRNA  Anesthesia Plan Comments:         Anesthesia Quick Evaluation

## 2017-07-21 NOTE — Anesthesia Procedure Notes (Signed)
Procedure Name: Intubation Date/Time: 07/21/2017 12:40 PM Performed by: Lavina Hamman, CRNA Pre-anesthesia Checklist: Patient identified, Emergency Drugs available, Suction available, Patient being monitored and Timeout performed Patient Re-evaluated:Patient Re-evaluated prior to induction Oxygen Delivery Method: Circle system utilized Preoxygenation: Pre-oxygenation with 100% oxygen Induction Type: IV induction Ventilation: Mask ventilation without difficulty Laryngoscope Size: Mac and 4 Grade View: Grade I Tube type: Oral Tube size: 7.5 mm Number of attempts: 1 Airway Equipment and Method: Stylet Placement Confirmation: ETT inserted through vocal cords under direct vision,  positive ETCO2,  CO2 detector and breath sounds checked- equal and bilateral Secured at: 22 cm Tube secured with: Tape Dental Injury: Teeth and Oropharynx as per pre-operative assessment

## 2017-07-21 NOTE — Transfer of Care (Signed)
Immediate Anesthesia Transfer of Care Note  Patient: Aaron [name]  Procedure(s) Performed: CT MICROWAVE THERMAL ABLATION-LIVER (N/A )  Patient Location: PACU  Anesthesia Type:General  Level of Consciousness: awake, alert  and oriented  Airway & Oxygen Therapy: Patient Spontanous Breathing and Patient connected to face mask oxygen  Post-op Assessment: Report given to RN  Post vital signs: Reviewed and stable  Last Vitals: There were no vitals filed for this visit.  Last Pain:  Vitals:   07/21/17 1208  PainSc: 0-No pain      Patients Stated Pain Goal: 4 (93/90/30 0923)  Complications: No apparent anesthesia complications

## 2017-07-21 NOTE — H&P (Signed)
Referring Physician(s): Kale,G/Kim,J  Supervising Physician: Markus Daft  Patient Status:  [facility] OP TBA  Chief Complaint:  Hepatocellular carcinoma  Subjective: Patient familiar to IR service from prior right hepatic lobe (seg 5/6) lesion biopsy in 2015 which revealed hepatocellular carcinoma, subsequent DEB-TACE of right [place] on 03/13/14, thermal ablation of [place] on 06/01/14 and repeat thermal ablation of tumor recurrence on 01/31/16.  He now has evidence of a new 1.3 cm lesion in segment 8 which meets criteria for thermal ablation and presents today for the procedure.  Past medical history also significant for hepatitis C, MGUS, renal insufficiency, hypertension and diabetes.  He currently denies fever, headache, chest pain, dyspnea, cough, back pain, nausea, vomiting or bleeding.  He does have some minor abdominal discomfort after exertion. Past Medical History:  Diagnosis Date  . Anemia    hx of   . Arthritis   . Diabetes mellitus without complication ([place])    type II   . Hepatitis    hepatitis C  . Hepatocellular carcinoma ([place]) 01/30/2014   Path  . Hyperlipidemia   . Hyperlipidemia   . Hypertension    Past Surgical History:  Procedure Laterality Date  . COLONOSCOPY    . IR GENERIC HISTORICAL  02/26/2016   IR RADIOLOGIST EVAL & MGMT 02/26/2016 Markus Daft, MD [facility] INTERV RAD  . IR GENERIC HISTORICAL  01/16/2016   IR RADIOLOGIST EVAL & MGMT 01/16/2016 Markus Daft, MD [facility] INTERV RAD  . IR GENERIC HISTORICAL  06/24/2016   IR RADIOLOGIST EVAL & MGMT 06/24/2016 Aletta Edouard, MD [facility] INTERV RAD  . IR RADIOLOGIST EVAL & MGMT  12/17/2016  . IR RADIOLOGIST EVAL & MGMT  06/16/2017  . POLYPECTOMY        Allergies: Patient has no known allergies.  Medications: Prior to Admission medications   Medication Sig Start Date End Date Taking? Authorizing Provider  acetaminophen (TYLENOL) 500 MG tablet Take 500-1,000 mg daily as needed by mouth for moderate pain.   Yes [provider]  aspirin EC 81 MG tablet Take 81 mg by mouth every morning.    Yes [provider]  cholecalciferol (VITAMIN D) 1000 UNITS tablet Take 1,000 Units by mouth every morning.    Yes [provider]  Insulin Glargine (BASAGLAR KWIKPEN) 100 UNIT/ML SOPN Inject 32 Units into the skin daily after breakfast.   Yes [provider]  lisinopril-hydrochlorothiazide (PRINZIDE,ZESTORETIC) 20-12.5 MG tablet Take 1 tablet by mouth daily.   Yes [provider]  Menthol, Topical Analgesic, (BIOFREEZE EX) Apply 1 application daily as needed topically (knee pain).   Yes [provider]  metFORMIN (GLUCOPHAGE) 1000 MG tablet Take 1,000 mg by mouth 2 (two) times daily with a meal.   Yes [provider]  Multiple Vitamin (MULTIVITAMIN WITH MINERALS) TABS tablet Take 1 tablet daily by mouth.   Yes [provider]  simvastatin (ZOCOR) 40 MG tablet Take 20 mg at bedtime by mouth.    Yes [provider]  sitaGLIPtin (JANUVIA) 100 MG tablet Take 50 mg daily by mouth.    Yes [provider]     Vital Signs: Ht 6' (1.829 m)   Wt 271 lb (122.9 kg)   BMI 36.75 kg/m   Physical Exam awake, alert.  Chest clear to auscultation bilaterally.  Heart with regular rate and rhythm.  Abdomen soft, obese, positive bowel sounds, currently nontender; trace to 1+ bilateral lower extremity edema.  Imaging: No results found.  Labs:  CBC: Recent  Labs    09/02/16 0854 03/22/17 1402 07/14/17 0833  WBC 5.3 5.3 5.9  HGB 12.4* 11.5* 11.3*  HCT 39.2 37.0* 36.0*  PLT 220 262 269    COAGS: Recent Labs    07/14/17 0833  INR 0.93    BMP: Recent Labs    09/02/16 0854 12/17/16 0725 03/22/17 1402 06/16/17 0738 07/14/17 0833  NA 139  --  140  --  138  K 4.6  --  4.6  --  4.3  CL  --   --   --   --  105  CO2 27  --  27  --  24  GLUCOSE 111  --  144*  --  140*  BUN 23.5  --  22.8  --  21*  CALCIUM 9.8  --  9.8  --  9.5  CREATININE 1.4*  1.30* 1.6* 1.40* 1.36*  GFRNONAA  --   --   --   --  51*  GFRAA  --   --   --   --  59*    LIVER FUNCTION TESTS: Recent Labs    09/02/16 0854 03/22/17 1402 03/22/17 1402 07/14/17 0833  BILITOT 0.54  --  0.33 0.7  AST 19  --  21 26  ALT 14  --  18 18  ALKPHOS 45  --  43 40  PROT 8.1  7.9 7.5 8.0 8.0  ALBUMIN 4.0  --  4.0 4.1    Assessment and Plan: Patient with history of hepatitis C, MGUS, renal insufficiency, hypertension, diabetes, prior segment 5/6 hepatocellular carcinoma treated with DEB-TACE on  03/13/14, followed by thermal ablations on 06/01/14 and again on 01/31/16;  now presents with new 1.3 cm segment 8 liver lesion concerning for hepatocellular carcinoma.  Patient was seen by Dr. Anselm Pancoast and deemed an appropriate candidate for CT guided thermal ablation of this new liver lesion. He presents today for the procedure. Risks and benefits of procedure were discussed with the patient /spouse including, but not limited to bleeding, infection, vascular injury, anesthesia related complications or contrast induced renal failure.  This interventional procedure involves the use of X-rays and because of the nature of the planned procedure, it is possible that we will have prolonged use of X-ray fluoroscopy/CT.  Potential radiation risks to you include (but are not limited to) the following: - A slightly elevated risk for cancer  several years later in life. This risk is typically less than 0.5% percent. This risk is low in comparison to the normal incidence of human cancer, which is 33% for women and 50% for men according to the [place]. - Radiation induced injury can include skin redness, resembling a rash, tissue breakdown / ulcers and hair loss (which can be temporary or permanent).   The likelihood of either of these occurring depends on the difficulty of the procedure and whether you are sensitive to radiation due to previous procedures, disease, or genetic conditions.     IF your procedure requires a prolonged use of radiation, you will be notified and given written instructions for further action.  It is your responsibility to monitor the irradiated area for the 2 weeks following the procedure and to notify your physician if you are concerned that you have suffered a radiation induced injury.    All of the patient's questions were answered, patient is agreeable to proceed.  Consent signed and in chart. Labs pend.  Postprocedure the patient will be admitted to the hospital for overnight observation.  Electronically Signed: D. Rowe Robert, PA-C 07/21/2017, 10:51 AM   I spent a total of 30 minutes at the the patient's bedside AND on the patient's hospital floor or unit, greater than 50% of which was counseling/coordinating care for CT-guided thermal ablation of the hepatocellular carcinoma

## 2017-07-21 NOTE — Procedures (Signed)
  Pre-operative Diagnosis: Recurrent HCC       Post-operative Diagnosis: Recurrent HCC   Indications: New liver lesion  Procedure: CT/US guided microwave ablation of Segment 8 lesion  Findings: Approximately 1.3 cm lesion in Segment 8.  15 gauge, 15 cm Neuwave PR probe was placed into and thru the lesion.  7 minutes treatment at [address].    Complications: none     EBL: Minimal  Plan: Observation overnight.  Pain control as needed.

## 2017-07-21 NOTE — Sedation Documentation (Signed)
Anesthesia in to monitor and sedate patient.

## 2017-07-21 NOTE — Anesthesia Postprocedure Evaluation (Signed)
Anesthesia Post Note  Patient: Elijha Dedman  Procedure(s) Performed: CT MICROWAVE THERMAL ABLATION-LIVER (N/A )     Patient location during evaluation: PACU Anesthesia Type: General Level of consciousness: awake and alert, awake and sedated Pain management: pain level controlled Vital Signs Assessment: post-procedure vital signs reviewed and stable Respiratory status: spontaneous breathing, nonlabored ventilation, respiratory function stable and patient connected to nasal cannula oxygen Cardiovascular status: blood pressure returned to baseline and stable Postop Assessment: no apparent nausea or vomiting Anesthetic complications: no    Last Vitals:  Vitals:   07/21/17 1500 07/21/17 1515  BP: 129/72 126/67  Pulse: 79 69  Resp: 12 19  Temp:    SpO2: 100% 100%    Last Pain:  Vitals:   07/21/17 1515  PainSc: 0-No pain                 Kruti Horacek,JAMES TERRILL

## 2017-07-22 ENCOUNTER — Other Ambulatory Visit: Payer: Self-pay | Admitting: Radiology

## 2017-07-22 DIAGNOSIS — C220 Liver cell carcinoma: Secondary | ICD-10-CM | POA: Diagnosis not present

## 2017-07-22 LAB — AFP TUMOR MARKER: AFP, Serum, Tumor Marker: 1.3 ng/mL (ref 0.0–8.3)

## 2017-07-22 LAB — CBC
HCT: 35 % — ABNORMAL LOW (ref 39.0–52.0)
Hemoglobin: 10.7 g/dL — ABNORMAL LOW (ref 13.0–17.0)
MCH: 21.7 pg — ABNORMAL LOW (ref 26.0–34.0)
MCHC: 30.6 g/dL (ref 30.0–36.0)
MCV: 71.1 fL — ABNORMAL LOW (ref 78.0–100.0)
Platelets: 240 10*3/uL (ref 150–400)
RBC: 4.92 MIL/uL (ref 4.22–5.81)
RDW: 15.1 % (ref 11.5–15.5)
WBC: 7.9 10*3/uL (ref 4.0–10.5)

## 2017-07-22 LAB — GLUCOSE, CAPILLARY: Glucose-Capillary: 156 mg/dL — ABNORMAL HIGH (ref 65–99)

## 2017-07-22 NOTE — Discharge Summary (Signed)
Patient ID: Aaron [name] MRN: [id number] DOB/AGE: 02/23/1946 71 y.o.  Admit date: 07/21/2017 Discharge date: 07/22/2017  Supervising Physician: Markus Daft  Patient Status: [facility] - In-pt  Admission Diagnoses: Hepatocellular carcinoma  Discharge Diagnoses: Hepatocellular carcinoma, s/p successful image guided microwave ablation of new segment 8 liver lesion/HCC on 07/21/17 Active Problems:   Hepatocellular carcinoma [facility])  Past Medical History:  Diagnosis Date  . Anemia    hx of   . Arthritis   . Diabetes mellitus without complication ([place])    type II   . Hepatitis    hepatitis C  . Hepatocellular carcinoma ([place]) 01/30/2014   Path  . Hyperlipidemia   . Hyperlipidemia   . Hypertension    Past Surgical History:  Procedure Laterality Date  . COLONOSCOPY    . IR GENERIC HISTORICAL  02/26/2016   IR RADIOLOGIST EVAL & MGMT 02/26/2016 Markus Daft, MD [facility] INTERV RAD  . IR GENERIC HISTORICAL  01/16/2016   IR RADIOLOGIST EVAL & MGMT 01/16/2016 Markus Daft, MD [facility] INTERV RAD  . IR GENERIC HISTORICAL  06/24/2016   IR RADIOLOGIST EVAL & MGMT 06/24/2016 Aletta Edouard, MD [facility] INTERV RAD  . IR RADIOLOGIST EVAL & MGMT  12/17/2016  . IR RADIOLOGIST EVAL & MGMT  06/16/2017  . POLYPECTOMY    . RADIOFREQUENCY ABLATION N/A 07/21/2017   Procedure: CT MICROWAVE THERMAL ABLATION-LIVER;  Surgeon: Markus Daft, MD;  Location: [facility] ORS;  Service: Anesthesiology;  Laterality: N/A;      Discharged Condition: good  Hospital Course: Aaron [name] is a 71 yo male with history of hepatitis C, MGUS, renal insufficiency, hypertension and diabetes as well as prior biopsy and DEB- TACE of a right hepatocellular carcinoma on 03/13/14, thermal ablation on 06/01/14 and repeat thermal ablation of tumor recurrence on 01/31/16.  He now has evidence of a new 1.3 cm lesion/HCC in segment 8 which met criteria for thermal ablation and he underwent successful image guided microwave ablation of this lesion by Dr. Anselm Pancoast on  07/21/17 via general anesthesia.  The procedure was performed without immediate complications and the patient was subsequently admitted to the hospital overnight for observation.   He did well post procedure with only exception of some minimal right upper quadrant tenderness.  On the day of discharge he was able to tolerate his diet, void and ambulate without significant difficulty.  He did have some mild dysuria post Foley cath removal.  Follow-up CBC was stable.  Findings were discussed with Dr. Annamaria Boots (IR) and he was deemed stable for discharge at this time.  He will continue on his current home medications.  He was given a prescription for oxycodone IR 5 mg, # 15, no refills, 1 tablet every 6 hours as needed for moderate to severe pain.  He will be scheduled for follow-up in the IR clinic with Dr.Henn in 1 month. He will continue current follow-up with both Dr. Georges Mouse and Dr.Kale as scheduled.  He was told to contact our service with any additional questions or concerns. Consults: none  Significant Diagnostic Studies:  Results for orders placed or performed during the hospital encounter of 07/21/17  AFP tumor marker  Result Value Ref Range   AFP, Serum, Tumor Marker 1.3 0.0 - 8.3 ng/mL  Comprehensive metabolic panel  Result Value Ref Range   Sodium 137 135 - 145 mmol/L   Potassium 4.5 3.5 - 5.1 mmol/L   Chloride 102 101 - 111 mmol/L   CO2 25 22 - 32 mmol/L   Glucose,  Bld 145 (H) 65 - 99 mg/dL   BUN 26 (H) 6 - 20 mg/dL   Creatinine, Ser 1.38 (H) 0.61 - 1.24 mg/dL   Calcium 9.6 8.9 - 10.3 mg/dL   Total Protein 8.2 (H) 6.5 - 8.1 g/dL   Albumin 4.4 3.5 - 5.0 g/dL   AST 22 15 - 41 U/L   ALT 16 (L) 17 - 63 U/L   Alkaline Phosphatase 41 38 - 126 U/L   Total Bilirubin 0.5 0.3 - 1.2 mg/dL   GFR calc non Af Amer 50 (L) >60 mL/min   GFR calc Af Amer 58 (L) >60 mL/min   Anion gap 10 5 - 15  Glucose, capillary  Result Value Ref Range   Glucose-Capillary 127 (H) 65 - 99 mg/dL  Glucose, capillary    Result Value Ref Range   Glucose-Capillary 127 (H) 65 - 99 mg/dL   Comment 1 Notify RN    Comment 2 Document in Chart   CBC  Result Value Ref Range   WBC 7.9 4.0 - 10.5 K/uL   RBC 4.92 4.22 - 5.81 MIL/uL   Hemoglobin 10.7 (L) 13.0 - 17.0 g/dL   HCT 35.0 (L) 39.0 - 52.0 %   MCV 71.1 (L) 78.0 - 100.0 fL   MCH 21.7 (L) 26.0 - 34.0 pg   MCHC 30.6 30.0 - 36.0 g/dL   RDW 15.1 11.5 - 15.5 %   Platelets 240 150 - 400 K/uL  Glucose, capillary  Result Value Ref Range   Glucose-Capillary 327 (H) 65 - 99 mg/dL  Glucose, capillary  Result Value Ref Range   Glucose-Capillary 156 (H) 65 - 99 mg/dL     Treatments: Successful image guided microwave ablation of new segment 8 liver lesion/hepatocellular carcinoma via general anesthesia on 07/21/17  Discharge Exam: Blood pressure 118/70, pulse 72, temperature 98.5 F (36.9 C), temperature source Oral, resp. rate 16, height 6' (1.829 m), weight 271 lb (122.9 kg), SpO2 98 %. Awake, alert.  Chest clear to auscultation bilaterally.  Heart with regular rate and rhythm.  Abdomen obese, soft, positive bowel sounds, minimal right upper quadrant tenderness at puncture site, no definite hematoma; lower extremities with no significant edema.  Disposition: 01-Home or Self Care  Discharge Instructions    Call MD for:  difficulty breathing, headache or visual disturbances   Complete by:  As directed    Call MD for:  extreme fatigue   Complete by:  As directed    Call MD for:  hives   Complete by:  As directed    Call MD for:  persistant dizziness or light-headedness   Complete by:  As directed    Call MD for:  persistant nausea and vomiting   Complete by:  As directed    Call MD for:  redness, tenderness, or signs of infection (pain, swelling, redness, odor or green/yellow discharge around incision site)   Complete by:  As directed    Call MD for:  severe uncontrolled pain   Complete by:  As directed    Call MD for:  temperature >100.4   Complete by:   As directed    Change dressing (specify)   Complete by:  As directed    May change bandaid over puncture site right abdomen daily for next 2-3 days; may wash site with soap and water.   Diet - low sodium heart healthy   Complete by:  As directed    Discharge instructions   Complete by:  As directed  May call radiology service at [phone] or [phone] with any questions or concerns.   Driving Restrictions   Complete by:  As directed    No driving for 48 hours   Increase activity slowly   Complete by:  As directed    Lifting restrictions   Complete by:  As directed    No heavy lifting for 3-4 days   May shower / Bathe   Complete by:  As directed    May walk up steps   Complete by:  As directed      Allergies as of 07/22/2017   No Known Allergies     Medication List    STOP taking these medications   acetaminophen 500 MG tablet Commonly known as:  TYLENOL     TAKE these medications   aspirin EC 81 MG tablet Take 81 mg by mouth every morning.   BASAGLAR KWIKPEN 100 UNIT/ML Sopn Inject 32 Units into the skin daily after breakfast.   BIOFREEZE EX Apply 1 application daily as needed topically (knee pain).   cholecalciferol 1000 units tablet Commonly known as:  VITAMIN D Take 1,000 Units by mouth every morning.   lisinopril-hydrochlorothiazide 20-12.5 MG tablet Commonly known as:  PRINZIDE,ZESTORETIC Take 1 tablet by mouth daily.   metFORMIN 1000 MG tablet Commonly known as:  GLUCOPHAGE Take 1,000 mg by mouth 2 (two) times daily with a meal.   multivitamin with minerals Tabs tablet Take 1 tablet daily by mouth.   simvastatin 40 MG tablet Commonly known as:  ZOCOR Take 20 mg at bedtime by mouth.   sitaGLIPtin 100 MG tablet Commonly known as:  JANUVIA Take 50 mg daily by mouth.            Discharge Care Instructions  (From admission, onward)        Start     Ordered   07/22/17 0000  Change dressing (specify)    Comments:  May change  bandaid over puncture site right abdomen daily for next 2-3 days; may wash site with soap and water.   07/22/17 9678     Follow-up Information    Markus Daft, MD Follow up today.   Specialties:  Interventional Radiology, Radiology Why:  Radiology service will call you with follow-up appointment with Dr. Anselm Pancoast in the IR clinic in 1 month. Call [phone] or [phone] with any questions.  Contact information: [place] [address] [phone]        Brunetta Genera, MD Follow up.   Specialties:  Hematology, Oncology Why:  follow up with Dr. Irene Limbo as scheduled Contact information: [place] [address] [phone]        Jani Gravel, MD Follow up.   Specialty:  Internal Medicine Why:  continue follow up with Dr. Maudie Mercury as scheduled Contact information: [address] [place] [address] [phone]            Electronically Signed: D. Rowe Robert, PA-C 07/22/2017, 9:34 AM   I have spent less than 30 minutes discharging Aaron [name].

## 2017-07-22 NOTE — Discharge Instructions (Signed)
Radiofrequency/ thermal  Ablation of Liver Tumors, Care After Refer to this sheet in the next few weeks. These instructions provide you with information on caring for yourself after your procedure. Your health care provider may also give you more specific instructions. Your treatment has been planned according to current medical practices, but problems sometimes occur. Call your health care provider if you have any problems or questions after your procedure. What can I expect after the procedure? After your procedure, you may have pain and discomfort in the upper abdomen. You will be given pain medicines to control this. Follow these instructions at home:  Take all medicines as directed by your health care provider.  Follow diet instructions as directed by your health care provider.  Follow instructions regarding rest and physical activity. Contact a health care provider if:  You cannot pass gas.  You cannot have a bowel movement within 2 days.  You have a skin rash.  You have a fever. Get help right away if:  You have severe or lasting abdominal pain or pain in your shoulder or back.  You have trouble swallowing or breathing.  You have severe weakness or dizziness.  You have chest pain or shortness of breath. This information is not intended to replace advice given to you by your health care provider. Make sure you discuss any questions you have with your health care provider. Document Released: 05/31/2013 Document Revised: 01/16/2016 Document Reviewed: 04/17/2013 Elsevier Interactive Patient Education  [employer].

## 2017-08-09 DIAGNOSIS — E119 Type 2 diabetes mellitus without complications: Secondary | ICD-10-CM | POA: Diagnosis not present

## 2017-08-09 DIAGNOSIS — I10 Essential (primary) hypertension: Secondary | ICD-10-CM | POA: Diagnosis not present

## 2017-08-09 DIAGNOSIS — E7800 Pure hypercholesterolemia, unspecified: Secondary | ICD-10-CM | POA: Diagnosis not present

## 2017-09-08 ENCOUNTER — Encounter: Payer: Self-pay | Admitting: Radiology

## 2017-09-08 ENCOUNTER — Ambulatory Visit
Admission: RE | Admit: 2017-09-08 | Discharge: 2017-09-08 | Disposition: A | Discharge status: Home / Self Care | Payer: 59 | Source: Ambulatory Visit | Attending: Radiology | Admitting: Radiology

## 2017-09-08 DIAGNOSIS — C220 Liver cell carcinoma: Secondary | ICD-10-CM

## 2017-09-08 DIAGNOSIS — Z8505 Personal history of malignant neoplasm of liver: Secondary | ICD-10-CM | POA: Diagnosis not present

## 2017-09-08 DIAGNOSIS — Z9889 Other specified postprocedural states: Secondary | ICD-10-CM | POA: Diagnosis not present

## 2017-09-08 HISTORY — PX: IR RADIOLOGIST EVAL & MGMT: IMG5224

## 2017-09-08 NOTE — Consult Note (Addendum)
Chief Complaint: Patient was seen in consultation today for  Chief Complaint  Patient presents with  . Follow-up    6 wk follow up Thermal Ablation of Liver   at the request of Allred,Darrell K  Referring Physician(s): Kale,Gautam   History of Present Illness: Aaron [name] is a 72 y.o. male with history of hepatocellular carcinoma, first diagnosed in 2015. Patient had DEB-TACE of the lesion on 03/13/2014, thermal ablation of the lesion on 06/01/2014. Patient underwent repeat thermal ablation for tumor recurrence on 02/10/2016. A new lesion was identified in 2018 and patient underwent microwave ablation of that lesion on 07/21/2017. Patient presents for his follow-up visit after the most recent thermal ablation. Patient has no complaints except for chronic left knee pain. His appetite is good. He denies fevers, chills, chest pain or abdominal pain. No issues with his urinary tract or bowel habits.  Past Medical History:  Diagnosis Date  . Anemia    hx of   . Arthritis   . Diabetes mellitus without complication ([place])    type II   . Hepatitis    hepatitis C  . Hepatocellular carcinoma ([place]) 01/30/2014   Path  . Hyperlipidemia   . Hyperlipidemia   . Hypertension     Past Surgical History:  Procedure Laterality Date  . COLONOSCOPY    . IR GENERIC HISTORICAL  02/26/2016   IR RADIOLOGIST EVAL & MGMT 02/26/2016 Markus Daft, MD [facility] INTERV RAD  . IR GENERIC HISTORICAL  01/16/2016   IR RADIOLOGIST EVAL & MGMT 01/16/2016 Markus Daft, MD [facility] INTERV RAD  . IR GENERIC HISTORICAL  06/24/2016   IR RADIOLOGIST EVAL & MGMT 06/24/2016 Aletta Edouard, MD [facility] INTERV RAD  . IR RADIOLOGIST EVAL & MGMT  12/17/2016  . IR RADIOLOGIST EVAL & MGMT  06/16/2017  . IR RADIOLOGIST EVAL & MGMT  09/08/2017  . POLYPECTOMY    . RADIOFREQUENCY ABLATION N/A 07/21/2017   Procedure: CT MICROWAVE THERMAL ABLATION-LIVER;  Surgeon: Markus Daft, MD;  Location: [facility];  Service: Anesthesiology;  Laterality: N/A;     Allergies: Patient has no known allergies.  Medications: Prior to Admission medications   Medication Sig Start Date End Date Taking? Authorizing Provider  aspirin EC 81 MG tablet Take 81 mg by mouth every morning.    Yes [provider]  cholecalciferol (VITAMIN D) 1000 UNITS tablet Take 1,000 Units by mouth every morning.    Yes [provider]  Insulin Glargine (BASAGLAR KWIKPEN) 100 UNIT/ML SOPN Inject 32 Units into the skin daily after breakfast.   Yes [provider]  lisinopril-hydrochlorothiazide (PRINZIDE,ZESTORETIC) 20-12.5 MG tablet Take 1 tablet by mouth daily.   Yes [provider]  metFORMIN (GLUCOPHAGE) 1000 MG tablet Take 1,000 mg by mouth 2 (two) times daily with a meal.   Yes [provider]  Multiple Vitamin (MULTIVITAMIN WITH MINERALS) TABS tablet Take 1 tablet daily by mouth.   Yes [provider]  simvastatin (ZOCOR) 40 MG tablet Take 20 mg at bedtime by mouth.    Yes [provider]  sitaGLIPtin (JANUVIA) 100 MG tablet Take 50 mg daily by mouth.    Yes [provider]  Menthol, Topical Analgesic, (BIOFREEZE EX) Apply 1 application daily as needed topically (knee pain).    [provider]     No family history on file.  Social History   Socioeconomic History  . Marital status: Married    Spouse name: Not on file  . Number of children: Not  on file  . Years of education: Not on file  . Highest education level: Not on file  Social Needs  . Financial resource strain: Not on file  . Food insecurity - worry: Not on file  . Food insecurity - inability: Not on file  . Transportation needs - medical: Not on file  . Transportation needs - non-medical: Not on file  Occupational History  . Not on file  Tobacco Use  . Smoking status: Former Smoker    Last attempt to quit: 01/30/1994    Years since quitting: 23.6  . Smokeless tobacco: Never Used  . Tobacco comment: stopped 25 yrs ago   Substance and Sexual Activity  . Alcohol use: No    Comment: stoped 30 years ago   . Drug use: No  . Sexual activity: Not Currently  Other Topics Concern  . Not on file  Social History Narrative  . Not on file    ECOG Status: 0 - Asymptomatic  Review of Systems: A 12 point ROS discussed and pertinent positives are indicated in the HPI above.  All other systems are negative.  Review of Systems  Constitutional: Negative for appetite change, chills, fatigue and fever.  Respiratory: Negative.   Cardiovascular: Negative.   Gastrointestinal: Negative.   Genitourinary: Negative.     Vital Signs: BP (!) 150/76   Pulse 85   Temp 98.3 F (36.8 C) (Oral)   Resp 15   Ht 6' (1.829 m)   Wt 272 lb (123.4 kg)   SpO2 99%   BMI 36.89 kg/m   Physical Exam  Constitutional: He appears well-developed. No distress.  Cardiovascular: Normal rate, regular rhythm and normal heart sounds.  Pulmonary/Chest: Effort normal and breath sounds normal.  Abdominal: Soft. Bowel sounds are normal.  Prior ablation puncture sites are difficult to identify. No tenderness in the abdomen.         Imaging: Ir Radiologist Eval & Mgmt  Result Date: 09/08/2017 Please refer to notes tab for details about interventional procedure. (Op Note)   Labs:  CBC: Recent Labs    03/22/17 1402 07/14/17 0833 07/22/17 0408  WBC 5.3 5.9 7.9  HGB 11.5* 11.3* 10.7*  HCT 37.0* 36.0* 35.0*  PLT 262 269 240    COAGS: Recent Labs    07/14/17 0833  INR 0.93    BMP: Recent Labs    03/22/17 1402 06/16/17 0738 07/14/17 0833 07/21/17 1027  NA 140  --  138 137  K 4.6  --  4.3 4.5  CL  --   --  105 102  CO2 27  --  24 25  GLUCOSE 144*  --  140* 145*  BUN 22.8  --  21* 26*  CALCIUM 9.8  --  9.5 9.6  CREATININE 1.6* 1.40* 1.36* 1.38*  GFRNONAA  --   --  51* 50*  GFRAA  --   --  59* 58*    LIVER FUNCTION TESTS: Recent Labs    03/22/17 1402 03/22/17 1402 07/14/17 0833 07/21/17 1027  BILITOT   --  0.33 0.7 0.5  AST  --  21 26 22   ALT  --  18 18 16*  ALKPHOS  --  43 40 41  PROT 7.5 8.0 8.0 8.2*  ALBUMIN  --  4.0 4.1 4.4    TUMOR MARKERS: No results for input(s): AFPTM, CEA, CA199, CHROMGRNA in the last 8760 hours.  Assessment and Plan:  72 year old with history of biopsy-proven hepatocellular cancer and recent CT guided microwave  ablation of a new liver lesion. Patient tolerated the ablation procedure well. He has no complaints at this time. Patient is at risk for local recurrence and new lesions based on his history. Plan for close imaging follow-up of the liver. Plan for a follow-up MRI of the liver in 2 months. AFP level has not been a remarkable screening tool for him. Patient understands that he will require lifetime surveillance at this point.    Thank you for this interesting consult.  I greatly enjoyed meeting Aaron [name] and look forward to participating in their care.  A copy of this report was sent to the requesting provider on this date.  Electronically Signed: Burman Riis 09/08/2017, 8:44 AM   I spent a total of    15 Minutes in face to face in clinical consultation, greater than 50% of which was counseling/coordinating care for hepatocellular carcinoma and post ablation.

## 2017-09-22 NOTE — Progress Notes (Signed)
[name] Kitchen    HEMATOLOGY/ONCOLOGY CLINIC NOTE  Date of Service: 09/23/2017  Patient Care Team: Jani Gravel, MD as PCP - General (Internal Medicine)  CHIEF COMPLAINTS/PURPOSE OF CONSULTATION:  MGUS HCC  HISTORY OF PRESENTING ILLNESS:  Aaron [name] is a wonderful 72 y.o. male who has been referred to [place] by Dr Jani Gravel for evaluation and management of monoclonal gammopathy of undetermined significance.  Patient has history of hypertension, diabetes, dyslipidemia, hepatocellular carcinoma (rx with TACE and percutaneous thermal ablation), hepatitis C status post treatment, iron deficiency anemia in 2014 treated with oral iron.  Patient had an SPEP done by his primary care physician on 10/04/2015 that showed an M spike of 0.3 g/dL. It was presumably will be done due to his complaints of fatigue. He has had no significant anemia. No new bone pains. No fevers/chills/drenching night sweats.  No new anemia..  Outside labs show hemoglobin of 12.4 with microcytosis with an MCV of 70.5, normal WBC count of 5.1k.  No evidence of hypercalcemia or significant renal failure on his outside labs.   INTERVAL HISTORY  Aaron [name] is here for his scheduled follow-up for MGUS and HCC. The patient's last visit with [place] was on 03/22/17. The pt reports that he is doing well overall and has felt well since his thermal ablation in November. 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 Pt reports that he is not currently taking his iron pills.   Of note since the patient last visit, pt on 07/21/17 the pt had an ablation of the concerning liver lesion performed by Dr. Markus Daft.  Lab results today (09/23/17) of CBC, CMP, and Reticulocytes is as follows: all values are WNL except for Hgb at 11.6, HCT at 38.0, 71.6 MCV, MCH at 21.8, MCHC at 30.5, RDW at 15.3, Creatinine at 1.53. Ferritin and MMP are pending today 09/23/17.   On review of systems, pt reports feeling very well, and denies abdominal pain, and any  other symptoms.   MEDICAL HISTORY:  Past Medical History:  Diagnosis Date  . Anemia    hx of   . Arthritis   . Diabetes mellitus without complication ([place])    type II   . Hepatitis    hepatitis C  . Hepatocellular carcinoma ([place]) 01/30/2014   Path  . Hyperlipidemia   . Hyperlipidemia   . Hypertension   Obesity Hepatocellular carcinoma treated with TACE and percutaneous thermal ablation by interventional radiology. Last MRI on 08/06/2015 showed slight decrease in the size of the ablation defect involving the right lobe of the liver area and no findings to suggest residual or recurrent hepatocellular carcinoma. Mild changes of liver cirrhosis. MRI Abd 06/24/2016- no evidence of recurrent HCC   Hepatitis C genotype 1A status post treatment with Viekira and Ribavarin for 24 weeks.  Monoclonal gammopathy of undetermined significance.  SURGICAL HISTORY:  Status post microwave ablation[October 2015] of liver lesion and TACE [July 2015] for [place]. EGD and colonoscopy in 2016   SOCIAL HISTORY: Social History   Socioeconomic History  . Marital status: Married    Spouse name: Not on file  . Number of children: Not on file  . Years of education: Not on file  . Highest education level: Not on file  Social Needs  . Financial resource strain: Not on file  . Food insecurity - worry: Not on file  . Food insecurity - inability: Not on file  . Transportation needs - medical: Not on file  . Transportation needs - non-medical: Not  on file  Occupational History  . Not on file  Tobacco Use  . Smoking status: Former Smoker    Last attempt to quit: 01/30/1994    Years since quitting: 23.6  . Smokeless tobacco: Never Used  . Tobacco comment: stopped 25 yrs ago  Substance and Sexual Activity  . Alcohol use: No    Comment: stoped 30 years ago   . Drug use: No  . Sexual activity: Not Currently  Other Topics Concern  . Not on file  Social History Narrative  . Not on file  Former smoker and  smoked 1 pack per day for about 20 years starting at age 38 quit 15 years ago.  FAMILY HISTORY: History reviewed. No pertinent family history.  ALLERGIES:  has No Known Allergies.  MEDICATIONS:  Current Outpatient Medications  Medication Sig Dispense Refill  . aspirin EC 81 MG tablet Take 81 mg by mouth every morning.     . cholecalciferol (VITAMIN D) 1000 UNITS tablet Take 1,000 Units by mouth every morning.     . Insulin Glargine (BASAGLAR KWIKPEN) 100 UNIT/ML SOPN Inject 32 Units into the skin daily after breakfast.    . lisinopril-hydrochlorothiazide (PRINZIDE,ZESTORETIC) 20-12.5 MG tablet Take 1 tablet by mouth daily.    . metFORMIN (GLUCOPHAGE) 1000 MG tablet Take 1,000 mg by mouth 2 (two) times daily with a meal.    . Multiple Vitamin (MULTIVITAMIN WITH MINERALS) TABS tablet Take 1 tablet daily by mouth.    . simvastatin (ZOCOR) 40 MG tablet Take 20 mg at bedtime by mouth.     . sitaGLIPtin (JANUVIA) 100 MG tablet Take 50 mg daily by mouth.      No current facility-administered medications for this visit.     REVIEW OF SYSTEMS:    10 Point review of Systems was done is negative except as noted above.  PHYSICAL EXAMINATION: ECOG PERFORMANCE STATUS: 2 - Symptomatic, <50% confined to bed  . Vitals:   09/23/17 0919  BP: (!) 166/88  Pulse: 85  Resp: 18  Temp: 98.4 F (36.9 C)  SpO2: 98%   Filed Weights   09/23/17 0919  Weight: 270 lb 9.6 oz (122.7 kg)   .Body mass index is 36.7 kg/m.  GENERAL:alert, in no acute distress and comfortable SKIN: skin color, texture, turgor are normal, no rashes or significant lesions EYES: normal, conjunctiva are pink and non-injected, sclera clear OROPHARYNX:no exudate, no erythema and lips, buccal mucosa, and tongue normal  NECK: supple, no JVD, thyroid normal size, non-tender, without nodularity LYMPH:  no palpable lymphadenopathy in the cervical, axillary or inguinal LUNGS: clear to auscultation with normal respiratory  effort HEART: regular rate & rhythm,  no murmurs and no lower extremity edema ABDOMEN: abdomen soft, non-tender, normoactive bowel sounds , no palpable hepatosplenomegaly. Musculoskeletal: no cyanosis of digits and no clubbing  PSYCH: alert & oriented x 3 with fluent speech NEURO: no focal motor/sensory deficits  LABORATORY DATA:  I have reviewed the data as listed  . CBC Latest Ref Rng & Units 09/23/2017 07/22/2017 07/14/2017  WBC 4.0 - 10.3 K/uL 6.5 7.9 5.9  Hemoglobin 13.0 - 17.0 g/dL - 10.7(L) 11.3(L)  Hematocrit 38.4 - 49.9 % 38.0(L) 35.0(L) 36.0(L)  Platelets 140 - 400 K/uL 285 240 269   . CBC    Component Value Date/Time   WBC 6.5 09/23/2017 0838   WBC 7.9 07/22/2017 0408   RBC 5.31 09/23/2017 0838   RBC 5.31 09/23/2017 0838   HGB 10.7 (L) 07/22/2017 0408  HGB 11.5 (L) 03/22/2017 1402   HCT 38.0 (L) 09/23/2017 0838   HCT 37.0 (L) 03/22/2017 1402   PLT 285 09/23/2017 0838   PLT 262 03/22/2017 1402   MCV 71.6 (L) 09/23/2017 0838   MCV 72.3 (L) 03/22/2017 1402   MCH 21.8 (L) 09/23/2017 0838   MCHC 30.5 (L) 09/23/2017 0838   RDW 15.3 (H) 09/23/2017 0838   RDW 15.6 (H) 03/22/2017 1402   LYMPHSABS 1.8 09/23/2017 0838   LYMPHSABS 1.8 03/22/2017 1402   MONOABS 0.4 09/23/2017 0838   MONOABS 0.4 03/22/2017 1402   EOSABS 0.3 09/23/2017 0838   EOSABS 0.1 03/22/2017 1402   BASOSABS 0.0 09/23/2017 0838   BASOSABS 0.0 03/22/2017 1402    . CMP Latest Ref Rng & Units 09/23/2017 07/21/2017 07/14/2017  Glucose 70 - 140 mg/dL 115 145(H) 140(H)  BUN 7 - 26 mg/dL 16 26(H) 21(H)  Creatinine 0.70 - 1.30 mg/dL 1.53(H) 1.38(H) 1.36(H)  Sodium 136 - 145 mmol/L 139 137 138  Potassium 3.5 - 5.1 mmol/L 4.8 4.5 4.3  Chloride 98 - 109 mmol/L 101 102 105  CO2 22 - 29 mmol/L 27 25 24   Calcium 8.4 - 10.4 mg/dL 9.8 9.6 9.5  Total Protein 6.4 - 8.3 g/dL 8.2 8.2(H) 8.0  Total Bilirubin 0.2 - 1.2 mg/dL 0.5 0.5 0.7  Alkaline Phos 40 - 150 U/L 46 41 40  AST 5 - 34 U/L 19 22 26   ALT 0 - 55  U/L 13 16(L) 18     Component     Latest Ref Rng 10/17/2015  IgG (Immunoglobin G), Serum     700 - 1600 mg/dL 1,273  IgA/Immunoglobulin A, Serum     61 - 437 mg/dL 235  IgM (Immunoglobin M), Srm     20 - 172 mg/dL 79  Total Protein     6.0 - 8.5 g/dL 7.5  Albumin SerPl Elph-Mcnc     2.9 - 4.4 g/dL 3.8  Alpha 1     0.0 - 0.4 g/dL 0.2  Alpha2 Glob SerPl Elph-Mcnc     0.4 - 1.0 g/dL 0.9  B-Globulin SerPl Elph-Mcnc     0.7 - 1.3 g/dL 1.2  Gamma Glob SerPl Elph-Mcnc     0.4 - 1.8 g/dL 1.4  M Protein SerPl Elph-Mcnc     Not Observed g/dL 0.2 (H)  Globulin, Total     2.2 - 3.9 g/dL 3.7  Albumin/Glob SerPl     0.7 - 1.7 1.1  IFE 1      Comment  Please Note (HCV):      Comment  Iron     42 - 163 ug/dL 65  TIBC     202 - 409 ug/dL 350  UIBC     117 - 376 ug/dL 286  %SAT     20 - 55 % 18 (L)  Beta 2     0.6 - 2.4 mg/L 2.2  LDH     125 - 245 U/L 177  Sed Rate     0 - 30 mm/hr 21  CRP     0.0 - 4.9 mg/L 2.8  Ferritin     22 - 316 ng/ml 88   IFE 1  Comment   Comments: Immunofixation shows IgG monoclonal protein with kappa light chain  specificity.           RADIOGRAPHIC STUDIES:  MRI abd w and wo contrast: 12/17/2016: IMPRESSION: 1. Postprocedural changes of thermal ablation again noted in the right lobe of the  liver, without definitive evidence to suggest local recurrence of disease. No new hepatic lesions are noted. 2. Additional incidental findings, as above.   Electronically Signed   By: Vinnie Langton M.D.   On: 12/17/2016 09:42  ASSESSMENT & PLAN:   72 year old male with  #1 Monoclonal gammopathy of undetermined significance.  M protein 0.2 mg/dL immunofixation showing IgG monoclonal protein with kappa light chain specificity. SPEP 02/2016 - 0.3g/dl  Today patient has no overt anemia.  No overt hypercalcemia. Stable CKD creatinine 1.6  #2 bone lucencies in the right radial mid midshaft and left humeral head. PET/CT did not show any  hypermetabolic bone lesions. MRI left shoulder showed no evidence of metastatic disease or multiple myeloma. The x-ray findings appear to be areas of mild demineralization. Patient was seen by orthopedics and no additional recommendations were given. Plan -Patient's anemia is stable. No significant change in renal function. No new bone pains. No hypercalcemia . No overall no overt evidence of progression of multiple myeloma. -Myeloma labs from today are in process and are currently pending.  #3 Hepatocellular carcinoma treated with TACE and percutaneous thermal ablation by interventional radiology. Last MRI on 08/06/2015 showed slight decrease in the size of the ablation defect involving the right lobe of the liver area and no findings to suggest residual or recurrent hepatocellular carcinoma. Mild changes of liver cirrhosis. MRI of the abdomen with and without contrast on 01/16/2016 showed resolution of previously ablated lesion but a new 1.3 cm lesion was noted which was subsequently ablated under CT guidance by interventional radiology on 01/31/2016. Elevated transaminases on 02/01/2016 were likely related to his ablation. These have since resolved.  MRI abdomen with and without contrast on 06/24/2016 shows no evidence of recurrent hepatocellular carcinoma . MRI abdomen with and without contrast on 12/17/2016 shows no evidence of recurrent hepatocellular carcinoma . #4 Hepatitis C genotype 1A status post treatment with Viekira and Ribavarin for 24 weeks.   Plan -continue f/u with IR-Dr. Markus Daft for ongoing management of [place].   -f/u on pending AFP tumor marker from today. -Continue follow-up and hepatitis C clinic for continued monitoring and management of his hepatitis C.  #5 Microcytosis with Minimal Anemia - Hgb electrophoresis suggestive of Beta thal trait given relative polycythemia. Some element of iron defi Iron sat 18%, ferritin 88 but could be over estimate in the setting of some liver  injury Ferritin level stable at 77 today. Plan -PO iron supplementation Niferex '150mg'$  po daily for maintenance for 47month and then stop if ferritin close to 100  -Discussed pt labwork today (09/23/17), his Ferritin labs are pending and we will check on these to evaluate his anemia. If significant change occurs we will notify pt and encourage him to continue with his PO Iron pills. -Pt will f/u with Dr. HAnselm Pancoastand have an MRI in the next two months to check his recently ablated liver.  Continue f/u with PCP, interventional radiology  and hepatitis C clinic.  Continue f/u with Dr. HAnselm PancoastRTC with Dr KIrene Limboin 6 months with rpt labs   All of the patients questions were answered with apparent satisfaction. The patient knows to call the clinic with any problems, questions or concerns.  I spent 20 minutes counseling the patient face to face. The total time spent in the appointment was 25 minutes and more than 50% was on counseling and direct patient cares.    GSullivan LoneMD MSnow HillAAHIVMS SLovelace Rehabilitation HospitalCTriangle Orthopaedics Surgery CenterHematology/Oncology Physician [place] (Office):  [phone] (Work cell):  [phone] (Fax):           [phone]  This document serves as a record of services personally performed by [name], MD. It was created on his behalf by Baldwin Jamaica, a trained medical scribe. The creation of this record is based on the scribe's personal observations and the provider's statements to them.   .I have reviewed the above documentation for accuracy and completeness, and I agree with the above. Brunetta Genera MD MS

## 2017-09-23 ENCOUNTER — Inpatient Hospital Stay: Payer: 59

## 2017-09-23 ENCOUNTER — Inpatient Hospital Stay: Payer: 59 | Attending: Hematology | Admitting: Hematology

## 2017-09-23 ENCOUNTER — Encounter: Payer: Self-pay | Admitting: Hematology

## 2017-09-23 VITALS — BP 166/88 | HR 85 | Temp 98.4°F | Resp 18 | Ht 72.0 in | Wt 270.6 lb

## 2017-09-23 DIAGNOSIS — B1920 Unspecified viral hepatitis C without hepatic coma: Secondary | ICD-10-CM | POA: Diagnosis not present

## 2017-09-23 DIAGNOSIS — D472 Monoclonal gammopathy: Secondary | ICD-10-CM

## 2017-09-23 DIAGNOSIS — D509 Iron deficiency anemia, unspecified: Secondary | ICD-10-CM | POA: Diagnosis not present

## 2017-09-23 DIAGNOSIS — Z8505 Personal history of malignant neoplasm of liver: Secondary | ICD-10-CM | POA: Insufficient documentation

## 2017-09-23 DIAGNOSIS — K7460 Unspecified cirrhosis of liver: Secondary | ICD-10-CM | POA: Insufficient documentation

## 2017-09-23 DIAGNOSIS — E785 Hyperlipidemia, unspecified: Secondary | ICD-10-CM | POA: Insufficient documentation

## 2017-09-23 DIAGNOSIS — E119 Type 2 diabetes mellitus without complications: Secondary | ICD-10-CM | POA: Diagnosis not present

## 2017-09-23 DIAGNOSIS — C220 Liver cell carcinoma: Secondary | ICD-10-CM

## 2017-09-23 DIAGNOSIS — I10 Essential (primary) hypertension: Secondary | ICD-10-CM | POA: Diagnosis not present

## 2017-09-23 LAB — CBC WITH DIFFERENTIAL (CANCER CENTER ONLY)
Basophils Absolute: 0 10*3/uL (ref 0.0–0.1)
Basophils Relative: 1 %
Eosinophils Absolute: 0.3 10*3/uL (ref 0.0–0.5)
Eosinophils Relative: 4 %
HCT: 38 % — ABNORMAL LOW (ref 38.4–49.9)
Hemoglobin: 11.6 g/dL — ABNORMAL LOW (ref 13.0–17.1)
Lymphocytes Relative: 27 %
Lymphs Abs: 1.8 10*3/uL (ref 0.9–3.3)
MCH: 21.8 pg — ABNORMAL LOW (ref 27.2–33.4)
MCHC: 30.5 g/dL — ABNORMAL LOW (ref 32.0–36.0)
MCV: 71.6 fL — ABNORMAL LOW (ref 79.3–98.0)
Monocytes Absolute: 0.4 10*3/uL (ref 0.1–0.9)
Monocytes Relative: 7 %
Neutro Abs: 4 10*3/uL (ref 1.5–6.5)
Neutrophils Relative %: 61 %
Platelet Count: 285 10*3/uL (ref 140–400)
RBC: 5.31 MIL/uL (ref 4.20–5.82)
RDW: 15.3 % — ABNORMAL HIGH (ref 11.0–14.6)
WBC Count: 6.5 10*3/uL (ref 4.0–10.3)

## 2017-09-23 LAB — COMPREHENSIVE METABOLIC PANEL
ALT: 13 U/L (ref 0–55)
AST: 19 U/L (ref 5–34)
Albumin: 4.2 g/dL (ref 3.5–5.0)
Alkaline Phosphatase: 46 U/L (ref 40–150)
Anion gap: 11 (ref 3–11)
BUN: 16 mg/dL (ref 7–26)
CO2: 27 mmol/L (ref 22–29)
Calcium: 9.8 mg/dL (ref 8.4–10.4)
Chloride: 101 mmol/L (ref 98–109)
Creatinine, Ser: 1.53 mg/dL — ABNORMAL HIGH (ref 0.70–1.30)
GFR calc Af Amer: 51 mL/min — ABNORMAL LOW (ref >60)
GFR calc non Af Amer: 44 mL/min — ABNORMAL LOW (ref >60)
Glucose, Bld: 115 mg/dL (ref 70–140)
Potassium: 4.8 mmol/L (ref 3.5–5.1)
Sodium: 139 mmol/L (ref 136–145)
Total Bilirubin: 0.5 mg/dL (ref 0.2–1.2)
Total Protein: 8.2 g/dL (ref 6.4–8.3)

## 2017-09-23 LAB — RETICULOCYTES
RBC.: 5.31 MIL/uL (ref 4.20–5.82)
Retic Count, Absolute: 74.3 10*3/uL (ref 34.8–93.9)
Retic Ct Pct: 1.4 % (ref 0.8–1.8)

## 2017-09-23 LAB — FERRITIN: Ferritin: 91 ng/mL (ref 22–316)

## 2017-09-24 LAB — AFP TUMOR MARKER: AFP, Serum, Tumor Marker: 1.8 ng/mL (ref 0.0–8.3)

## 2017-09-27 LAB — MULTIPLE MYELOMA PANEL, SERUM
Albumin SerPl Elph-Mcnc: 3.9 g/dL (ref 2.9–4.4)
Albumin/Glob SerPl: 1.1 (ref 0.7–1.7)
Alpha 1: 0.3 g/dL (ref 0.0–0.4)
Alpha2 Glob SerPl Elph-Mcnc: 0.9 g/dL (ref 0.4–1.0)
B-Globulin SerPl Elph-Mcnc: 1.2 g/dL (ref 0.7–1.3)
Gamma Glob SerPl Elph-Mcnc: 1.4 g/dL (ref 0.4–1.8)
Globulin, Total: 3.7 g/dL (ref 2.2–3.9)
IgA: 213 mg/dL (ref 61–437)
IgG (Immunoglobin G), Serum: 1333 mg/dL (ref 700–1600)
IgM (Immunoglobulin M), Srm: 73 mg/dL (ref 20–172)
M Protein SerPl Elph-Mcnc: 0.3 g/dL — ABNORMAL HIGH
Total Protein ELP: 7.6 g/dL (ref 6.0–8.5)

## 2017-10-04 DIAGNOSIS — I10 Essential (primary) hypertension: Secondary | ICD-10-CM | POA: Diagnosis not present

## 2017-10-04 DIAGNOSIS — E119 Type 2 diabetes mellitus without complications: Secondary | ICD-10-CM | POA: Diagnosis not present

## 2017-10-07 ENCOUNTER — Other Ambulatory Visit (HOSPITAL_COMMUNITY): Payer: Self-pay | Admitting: Diagnostic Radiology

## 2017-10-07 ENCOUNTER — Other Ambulatory Visit: Payer: Self-pay | Admitting: Radiology

## 2017-10-07 DIAGNOSIS — C220 Liver cell carcinoma: Secondary | ICD-10-CM

## 2017-10-07 MED ORDER — ALPRAZOLAM 0.5 MG PO TABS: 0.5000 mg | ORAL_TABLET | ORAL | 0 refills | Status: DC

## 2017-10-26 ENCOUNTER — Encounter: Payer: Self-pay | Admitting: Radiology

## 2017-10-26 ENCOUNTER — Ambulatory Visit
Admission: RE | Admit: 2017-10-26 | Discharge: 2017-10-26 | Disposition: A | Discharge status: Home / Self Care | Payer: 59 | Source: Ambulatory Visit | Attending: Diagnostic Radiology | Admitting: Diagnostic Radiology

## 2017-10-26 ENCOUNTER — Ambulatory Visit (HOSPITAL_COMMUNITY)
Admission: RE | Admit: 2017-10-26 | Discharge: 2017-10-26 | Disposition: A | Discharge status: Home / Self Care | Payer: 59 | Source: Ambulatory Visit | Attending: Diagnostic Radiology | Admitting: Diagnostic Radiology

## 2017-10-26 DIAGNOSIS — C220 Liver cell carcinoma: Secondary | ICD-10-CM | POA: Insufficient documentation

## 2017-10-26 DIAGNOSIS — Z9889 Other specified postprocedural states: Secondary | ICD-10-CM | POA: Insufficient documentation

## 2017-10-26 HISTORY — PX: IR RADIOLOGIST EVAL & MGMT: IMG5224

## 2017-10-26 MED ORDER — GADOXETATE DISODIUM 0.25 MMOL/ML IV SOLN
10.0000 mL | Freq: Once | INTRAVENOUS | Status: AC | PRN
  Administered 2017-10-26: 10 mL via INTRAVENOUS

## 2017-10-26 NOTE — Progress Notes (Signed)
Chief Complaint: Patient was seen in consultation today for  Chief Complaint  Patient presents with  . Follow-up    3 mo follow up Thermal Albation of Hepatocellular Carcinoma     at the request of Curtis Uriarte  Referring Physician(s): Kale,Gautam; Drazek, Dawn  History of Present Illness: Aaron [name] is a 72 y.o. male with history of hepatocellular carcinoma, first diagnosed in 58. Patient had DEB-TACE of the lesion on 03/13/2014, thermal ablation of the lesion on 06/01/2014. Patient underwent repeat thermal ablation for tumor recurrence on 02/10/2016. A new lesion was identified in 2018 and patient underwent microwave ablation of that lesion on 07/21/2017. Patient presents for his 3 month follow-up visit after the most recent thermal ablation and following liver MRI today.  Patient has no significant complaints. His biggest problems right now are knee pain particularly on the right side. The knee pain was exacerbated recently when he was doing painting at home. It sounds like the knee pain is related to osteoarthritis. His wife and him recently had a GI virus and he had a few days of diarrhea which has resolved. Overall, he has been slowly losing weight intentionally. He has lost approximately 20 pounds over a few months by decreasing his carbohydrate intake. He has no significant chest or abdominal complaints.    Past Medical History:  Diagnosis Date  . Anemia    hx of   . Arthritis   . Diabetes mellitus without complication ([place])    type II   . Hepatitis    hepatitis C  . Hepatocellular carcinoma ([place]) 01/30/2014   Path  . Hyperlipidemia   . Hyperlipidemia   . Hypertension     Past Surgical History:  Procedure Laterality Date  . COLONOSCOPY    . IR GENERIC HISTORICAL  02/26/2016   IR RADIOLOGIST EVAL & MGMT 02/26/2016 Markus Daft, MD [facility] INTERV RAD  . IR GENERIC HISTORICAL  01/16/2016   IR RADIOLOGIST EVAL & MGMT 01/16/2016 Markus Daft, MD [facility] INTERV RAD  . IR GENERIC  HISTORICAL  06/24/2016   IR RADIOLOGIST EVAL & MGMT 06/24/2016 Aletta Edouard, MD [facility] INTERV RAD  . IR RADIOLOGIST EVAL & MGMT  12/17/2016  . IR RADIOLOGIST EVAL & MGMT  06/16/2017  . IR RADIOLOGIST EVAL & MGMT  09/08/2017  . IR RADIOLOGIST EVAL & MGMT  10/26/2017  . POLYPECTOMY    . RADIOFREQUENCY ABLATION N/A 07/21/2017   Procedure: CT MICROWAVE THERMAL ABLATION-LIVER;  Surgeon: Markus Daft, MD;  Location: [facility];  Service: Anesthesiology;  Laterality: N/A;    Allergies: Patient has no known allergies.  Medications: Prior to Admission medications   Medication Sig Start Date End Date Taking? Authorizing Provider  aspirin EC 81 MG tablet Take 81 mg by mouth every morning.    Yes [provider]  cholecalciferol (VITAMIN D) 1000 UNITS tablet Take 1,000 Units by mouth every morning.    Yes [provider]  Insulin Glargine (BASAGLAR KWIKPEN) 100 UNIT/ML SOPN Inject 32 Units into the skin daily after breakfast.   Yes [provider]  lisinopril-hydrochlorothiazide (PRINZIDE,ZESTORETIC) 20-12.5 MG tablet Take 1 tablet by mouth daily.   Yes [provider]  metFORMIN (GLUCOPHAGE) 1000 MG tablet Take 1,000 mg by mouth 2 (two) times daily with a meal.   Yes [provider]  Multiple Vitamin (MULTIVITAMIN WITH MINERALS) TABS tablet Take 1 tablet daily by mouth.   Yes [provider]  simvastatin (ZOCOR) 40 MG tablet Take 20 mg at bedtime by mouth.  Yes [provider]  sitaGLIPtin (JANUVIA) 100 MG tablet Take 50 mg daily by mouth.    Yes [provider]  ALPRAZolam (XANAX) 0.5 MG tablet Take 1 tablet (0.5 mg total) by mouth as directed. Take 2 tablets (1.0 mg total) prior to MR scan.  Make take an additional 0.5 mg po if needed. Patient not taking: Reported on 10/26/2017 10/26/17   Markus Daft, MD     No family history on file.  Social History   Socioeconomic History  . Marital status: Married    Spouse name: Not on file  .  Number of children: Not on file  . Years of education: Not on file  . Highest education level: Not on file  Social Needs  . Financial resource strain: Not on file  . Food insecurity - worry: Not on file  . Food insecurity - inability: Not on file  . Transportation needs - medical: Not on file  . Transportation needs - non-medical: Not on file  Occupational History  . Not on file  Tobacco Use  . Smoking status: Former Smoker    Last attempt to quit: 01/30/1994    Years since quitting: 23.7  . Smokeless tobacco: Never Used  . Tobacco comment: stopped 25 yrs ago  Substance and Sexual Activity  . Alcohol use: No    Comment: stoped 30 years ago   . Drug use: No  . Sexual activity: Not Currently  Other Topics Concern  . Not on file  Social History Narrative  . Not on file     Review of Systems: A 12 point ROS discussed and pertinent positives are indicated in the HPI above.  All other systems are negative.  Review of Systems  Constitutional: Negative for activity change, chills and fever.  Respiratory: Negative.   Cardiovascular: Negative.   Gastrointestinal: Positive for diarrhea.  Genitourinary: Negative.     Vital Signs: BP 106/62   Pulse 88   Temp 98.5 F (36.9 C) (Oral)   Resp 16   Ht 6' (1.829 m)   Wt 256 lb (116.1 kg)   SpO2 98%   BMI 34.72 kg/m   Physical Exam  Constitutional: No distress.  Cardiovascular: Normal rate, regular rhythm and normal heart sounds.  Pulmonary/Chest: Effort normal and breath sounds normal.  Abdominal: Soft. Bowel sounds are normal.    Imaging: Mr Abdomen Wwo Contrast  Result Date: 10/26/2017 CLINICAL DATA:  Follow-up thermal ablation EXAM: MRI ABDOMEN WITHOUT AND WITH CONTRAST TECHNIQUE: Multiplanar multisequence MR imaging of the abdomen was performed both before and after the administration of intravenous contrast. CONTRAST:  34mL EOVIST GADOXETATE DISODIUM 0.25 MOL/L IV SOLN COMPARISON:  MRI abdomen dated 06/16/2017 FINDINGS:  Motion degraded images. Lower chest: Lung bases are clear. Hepatobiliary: Status post thermal ablation of the prior lesion in segment 8. Ablation zone measures 2.7 x 1.6 cm (series 15/image 34). Intrinsic T1 hyperintensity, likely reflecting coagulative necrosis. No convincing enhancement on postcontrast subtraction imaging, although evaluation is mildly limited due to motion degradation. Prior ablation in segment 7. Ablation zone currently measures 3.4 x 2.8 cm and is notable for a stable thickened rim (series 15/image 34), previously 3.6 x 2.6 cm when measured in a similar fashion, grossly unchanged. Intrinsic T1 hyperintensity, likely reflecting coagulative necrosis. No convincing central enhancement on postcontrast subtraction imaging. Apparent rim enhancement may be due to the patient motion. No new/suspicious enhancing lesions in the liver. Gallbladder is unremarkable. No intrahepatic or extrahepatic ductal dilatation. Pancreas:  Within normal limits.  Spleen:  Within normal limits. Adrenals/Urinary Tract:  Adrenal glands are within normal limits. Kidneys are within normal limits.  No hydronephrosis. Stomach/Bowel: Stomach is within normal limits. Visualized bowel is unremarkable. Vascular/Lymphatic:  No evidence of abdominal aortic aneurysm. No suspicious abdominal lymphadenopathy. Other:  No abdominal ascites. Musculoskeletal: No focal osseous lesions. IMPRESSION: Motion degraded images. Status post thermal ablation of the segment 8 lesion, without enhancement to suggest residual viable tumor. Prior ablation of a segment 7 lesion, grossly unchanged. No convincing central enhancement. Electronically Signed   By: Julian Hy M.D.   On: 10/26/2017 09:30   Ir Radiologist Eval & Mgmt  Result Date: 10/26/2017 Please refer to notes tab for details about interventional procedure. (Op Note)   Labs:  CBC: Recent Labs    03/22/17 1402 07/14/17 0833 07/22/17 0408 09/23/17 0838  WBC 5.3 5.9 7.9 6.5    HGB 11.5* 11.3* 10.7*  --   HCT 37.0* 36.0* 35.0* 38.0*  PLT 262 269 240 285    COAGS: Recent Labs    07/14/17 0833  INR 0.93    BMP: Recent Labs    03/22/17 1402 06/16/17 0738 07/14/17 0833 07/21/17 1027 09/23/17 0838  NA 140  --  138 137 139  K 4.6  --  4.3 4.5 4.8  CL  --   --  105 102 101  CO2 27  --  24 25 27   GLUCOSE 144*  --  140* 145* 115  BUN 22.8  --  21* 26* 16  CALCIUM 9.8  --  9.5 9.6 9.8  CREATININE 1.6* 1.40* 1.36* 1.38* 1.53*  GFRNONAA  --   --  51* 50* 44*  GFRAA  --   --  59* 58* 51*    LIVER FUNCTION TESTS: Recent Labs    03/22/17 1402 07/14/17 0833 07/21/17 1027 09/23/17 0838  BILITOT 0.33 0.7 0.5 0.5  AST 21 26 22 19   ALT 18 18 16* 13  ALKPHOS 43 40 41 46  PROT 8.0 8.0 8.2* 8.2  ALBUMIN 4.0 4.1 4.4 4.2    TUMOR MARKERS: No results for input(s): AFPTM, CEA, CA199, CHROMGRNA in the last 8760 hours.  Assessment and Plan:  72 year old with history of hepatocellular carcinoma, treated hepatitis C and most recently treated for a new liver lesion with microwave thermal ablation. Patient tolerated the most recent ablation procedure very well. I reviewed the MRI from today and there are expected posttreatment changes at the recently treated lesion in segment 8 and the previously treated lesions in segment 7. The study has some technical limitations from motion artifact but no evidence for residual tumor or new lesions. Patient understands that he is at risk for developing a recurrence or new lesion since he is already demonstrated this in the past. Fortunately, his recurrences have been very small and we've been able to treat them with localized therapy. Plan for continued close surveillance of the liver for recurrent disease. Plan for another MRI of the liver with and without contrast in 6 months with a follow-up visit. Patient will contact us if he has a questions or concerns in the interim.   Thank you for this interesting consult.  I greatly  enjoyed meeting Aaron [name] and look forward to participating in their care.  A copy of this report was sent to the requesting provider on this date.  Electronically Signed: Burman Riis 10/26/2017, 10:10 AM   I spent a total of    15 Minutes in face to face in  clinical consultation, greater than 50% of which was counseling/coordinating care for hepatocellular carcinoma.   Patient ID: Aaron [name], male   DOB: Oct 19, 1945, 72 y.o.   MRN: [id number]

## 2017-12-08 DIAGNOSIS — K7469 Other cirrhosis of liver: Secondary | ICD-10-CM | POA: Diagnosis not present

## 2017-12-08 DIAGNOSIS — C220 Liver cell carcinoma: Secondary | ICD-10-CM | POA: Diagnosis not present

## 2018-02-08 DIAGNOSIS — Z125 Encounter for screening for malignant neoplasm of prostate: Secondary | ICD-10-CM | POA: Diagnosis not present

## 2018-02-08 DIAGNOSIS — E7800 Pure hypercholesterolemia, unspecified: Secondary | ICD-10-CM | POA: Diagnosis not present

## 2018-02-08 DIAGNOSIS — I10 Essential (primary) hypertension: Secondary | ICD-10-CM | POA: Diagnosis not present

## 2018-02-08 DIAGNOSIS — E119 Type 2 diabetes mellitus without complications: Secondary | ICD-10-CM | POA: Diagnosis not present

## 2018-03-23 NOTE — Progress Notes (Signed)
[name] Kitchen    HEMATOLOGY/ONCOLOGY CLINIC NOTE  Date of Service: 03/24/2018  Patient Care Team: Jani Gravel, MD as PCP - General (Internal Medicine)  CHIEF COMPLAINTS/PURPOSE OF CONSULTATION:  MGUS HCC  HISTORY OF PRESENTING ILLNESS:  Aaron [name] is a wonderful 71 y.o. male who has been referred to [place] by Dr Jani Gravel for evaluation and management of monoclonal gammopathy of undetermined significance.  Patient has history of hypertension, diabetes, dyslipidemia, hepatocellular carcinoma (rx with TACE and percutaneous thermal ablation), hepatitis C status post treatment, iron deficiency anemia in 2014 treated with oral iron.  Patient had an SPEP done by his primary care physician on 10/04/2015 that showed an M spike of 0.3 g/dL. It was presumably will be done due to his complaints of fatigue. He has had no significant anemia. No new bone pains. No fevers/chills/drenching night sweats.  No new anemia..  Outside labs show hemoglobin of 12.4 with microcytosis with an MCV of 70.5, normal WBC count of 5.1k.  No evidence of hypercalcemia or significant renal failure on his outside labs.   INTERVAL HISTORY  Aaron [name]  is here for his scheduled follow-up for MGUS and HCC. The patient's last visit with [place] was on 09/23/17. The pt reports that he is doing well overall.   The pt reports that he has had no new concerns in the interim. He denies any new abdominal pains. He continues to see Dr. Louellen Molder in GI for evaluation of his Hepatitis C, every 6 months.   The pt continues follow up with Dr. Markus Daft regarding his [facility] concerns, and will have another repeat MRI Abdomen in September.   He continues on HCTZ/Lisinopril. He notes that his diabetes and HTN have been well controlled overall.   Of note since the patient's last visit, pt has had MRI Abdomen completed on 10/26/17 with results revealing Motion degraded images. Status post thermal ablation of the segment 8 lesion, without enhancement to  suggest residual viable tumor. Prior ablation of a segment 7 lesion, grossly unchanged. No convincing central enhancement.  Lab results today (03/24/18) of CBC w/diff, CMP, and Reticulocytes is as follows: all values are WNL except for HGB at 11.1, HCT at 35.6, MCV at 71.2, MCH at 22.2, MCHC at 31.2, RDW at 15.6, Glucose at 127, BUN at 30, Creatinine at 1.59, GFR at 49. MMP 03/24/18 is pending AFP tumor marker 03/24/18 is pending  On review of systems, pt reports good energy levels, and denies new bone pains, abdominal pains, pain along the spine, fatigue, and any other symptoms.   MEDICAL HISTORY:  Past Medical History:  Diagnosis Date  . Anemia    hx of   . Arthritis   . Diabetes mellitus without complication ([place])    type II   . Hepatitis    hepatitis C  . Hepatocellular carcinoma ([place]) 01/30/2014   Path  . Hyperlipidemia   . Hyperlipidemia   . Hypertension   Obesity Hepatocellular carcinoma treated with TACE and percutaneous thermal ablation by interventional radiology. Last MRI on 08/06/2015 showed slight decrease in the size of the ablation defect involving the right lobe of the liver area and no findings to suggest residual or recurrent hepatocellular carcinoma. Mild changes of liver cirrhosis. MRI Abd 06/24/2016- no evidence of recurrent HCC   Hepatitis C genotype 1A status post treatment with Viekira and Ribavarin for 24 weeks.  Monoclonal gammopathy of undetermined significance.  SURGICAL HISTORY:  Status post microwave ablation[October 2015] of liver lesion and TACE [July 2015]  for [place]. EGD and colonoscopy in 2016   SOCIAL HISTORY: Social History   Socioeconomic History  . Marital status: Married    Spouse name: Not on file  . Number of children: Not on file  . Years of education: Not on file  . Highest education level: Not on file  Occupational History  . Not on file  Social Needs  . Financial resource strain: Not on file  . Food insecurity:    Worry: Not on file      Inability: Not on file  . Transportation needs:    Medical: Not on file    Non-medical: Not on file  Tobacco Use  . Smoking status: Former Smoker    Last attempt to quit: 01/30/1994    Years since quitting: 24.1  . Smokeless tobacco: Never Used  . Tobacco comment: stopped 25 yrs ago  Substance and Sexual Activity  . Alcohol use: No    Comment: stoped 30 years ago   . Drug use: No  . Sexual activity: Not Currently  Lifestyle  . Physical activity:    Days per week: Not on file    Minutes per session: Not on file  . Stress: Not on file  Relationships  . Social connections:    Talks on phone: Not on file    Gets together: Not on file    Attends religious service: Not on file    Active member of club or organization: Not on file    Attends meetings of clubs or organizations: Not on file    Relationship status: Not on file  . Intimate partner violence:    Fear of current or ex partner: Not on file    Emotionally abused: Not on file    Physically abused: Not on file    Forced sexual activity: Not on file  Other Topics Concern  . Not on file  Social History Narrative  . Not on file  Former smoker and smoked 1 pack per day for about 20 years starting at age 24 quit 60 years ago.  FAMILY HISTORY: History reviewed. No pertinent family history.  ALLERGIES:  has No Known Allergies.  MEDICATIONS:  Current Outpatient Medications  Medication Sig Dispense Refill  . ALPRAZolam (XANAX) 0.5 MG tablet Take 1 tablet (0.5 mg total) by mouth as directed. Take 2 tablets (1.0 mg total) prior to MR scan.  Make take an additional 0.5 mg po if needed. (Patient not taking: Reported on 10/26/2017) 3 tablet 0  . aspirin EC 81 MG tablet Take 81 mg by mouth every morning.     . cholecalciferol (VITAMIN D) 1000 UNITS tablet Take 1,000 Units by mouth every morning.     . Insulin Glargine (BASAGLAR KWIKPEN) 100 UNIT/ML SOPN Inject 32 Units into the skin daily after breakfast.    .  lisinopril-hydrochlorothiazide (PRINZIDE,ZESTORETIC) 20-12.5 MG tablet Take 1 tablet by mouth daily.    . metFORMIN (GLUCOPHAGE) 1000 MG tablet Take 1,000 mg by mouth 2 (two) times daily with a meal.    . Multiple Vitamin (MULTIVITAMIN WITH MINERALS) TABS tablet Take 1 tablet daily by mouth.    . simvastatin (ZOCOR) 40 MG tablet Take 20 mg at bedtime by mouth.     . sitaGLIPtin (JANUVIA) 100 MG tablet Take 50 mg daily by mouth.      No current facility-administered medications for this visit.     REVIEW OF SYSTEMS:    A 10+ POINT REVIEW OF SYSTEMS WAS OBTAINED including neurology, dermatology, psychiatry,  cardiac, respiratory, lymph, extremities, GI, GU, Musculoskeletal, constitutional, breasts, reproductive, HEENT.  All pertinent positives are noted in the HPI.  All others are negative.   PHYSICAL EXAMINATION: ECOG PERFORMANCE STATUS: 2 - Symptomatic, <50% confined to bed  . Vitals:   03/24/18 1059  BP: (!) 155/80  Pulse: 63  Resp: 18  Temp: 98.6 F (37 C)  SpO2: 100%   Filed Weights   03/24/18 1059  Weight: 262 lb 11.2 oz (119.2 kg)   .Body mass index is 35.63 kg/m.  GENERAL:alert, in no acute distress and comfortable SKIN: no acute rashes, no significant lesions EYES: conjunctiva are pink and non-injected, sclera anicteric OROPHARYNX: MMM, no exudates, no oropharyngeal erythema or ulceration NECK: supple, no JVD LYMPH:  no palpable lymphadenopathy in the cervical, axillary or inguinal regions LUNGS: clear to auscultation b/l with normal respiratory effort HEART: regular rate & rhythm ABDOMEN:  normoactive bowel sounds , non tender, not distended. No palpable hepatosplenomegaly.  Extremity: no pedal edema PSYCH: alert & oriented x 3 with fluent speech NEURO: no focal motor/sensory deficits   LABORATORY DATA:  I have reviewed the data as listed  . CBC Latest Ref Rng & Units 03/24/2018 09/23/2017 07/22/2017  WBC 4.0 - 10.3 K/uL 5.8 6.5 7.9  Hemoglobin 13.0 - 17.1 g/dL  11.1(L) 11.6(L) 10.7(L)  Hematocrit 38.4 - 49.9 % 35.6(L) 38.0(L) 35.0(L)  Platelets 140 - 400 K/uL 242 285 240   . CBC    Component Value Date/Time   WBC 5.8 03/24/2018 0917   RBC 5.00 03/24/2018 0917   RBC 5.00 03/24/2018 0917   HGB 11.1 (L) 03/24/2018 0917   HGB 11.6 (L) 09/23/2017 0838   HGB 11.5 (L) 03/22/2017 1402   HCT 35.6 (L) 03/24/2018 0917   HCT 37.0 (L) 03/22/2017 1402   PLT 242 03/24/2018 0917   PLT 285 09/23/2017 0838   PLT 262 03/22/2017 1402   MCV 71.2 (L) 03/24/2018 0917   MCV 72.3 (L) 03/22/2017 1402   MCH 22.2 (L) 03/24/2018 0917   MCHC 31.2 (L) 03/24/2018 0917   RDW 15.6 (H) 03/24/2018 0917   RDW 15.6 (H) 03/22/2017 1402   LYMPHSABS 1.9 03/24/2018 0917   LYMPHSABS 1.8 03/22/2017 1402   MONOABS 0.5 03/24/2018 0917   MONOABS 0.4 03/22/2017 1402   EOSABS 0.1 03/24/2018 0917   EOSABS 0.1 03/22/2017 1402   BASOSABS 0.0 03/24/2018 0917   BASOSABS 0.0 03/22/2017 1402    . CMP Latest Ref Rng & Units 03/24/2018 09/23/2017 07/21/2017  Glucose 70 - 99 mg/dL 127(H) 115 145(H)  BUN 8 - 23 mg/dL 30(H) 16 26(H)  Creatinine 0.61 - 1.24 mg/dL 1.59(H) 1.53(H) 1.38(H)  Sodium 135 - 145 mmol/L 140 139 137  Potassium 3.5 - 5.1 mmol/L 4.8 4.8 4.5  Chloride 98 - 111 mmol/L 105 101 102  CO2 22 - 32 mmol/L 25 27 25   Calcium 8.9 - 10.3 mg/dL 9.8 9.8 9.6  Total Protein 6.5 - 8.1 g/dL 8.0 8.2 8.2(H)  Total Bilirubin 0.3 - 1.2 mg/dL 0.5 0.5 0.5  Alkaline Phos 38 - 126 U/L 44 46 41  AST 15 - 41 U/L 15 19 22   ALT 0 - 44 U/L 12 13 16(L)     Component     Latest Ref Rng 10/17/2015  IgG (Immunoglobin G), Serum     700 - 1600 mg/dL 1,273  IgA/Immunoglobulin A, Serum     61 - 437 mg/dL 235  IgM (Immunoglobin M), Srm     20 - 172 mg/dL  79  Total Protein     6.0 - 8.5 g/dL 7.5  Albumin SerPl Elph-Mcnc     2.9 - 4.4 g/dL 3.8  Alpha 1     0.0 - 0.4 g/dL 0.2  Alpha2 Glob SerPl Elph-Mcnc     0.4 - 1.0 g/dL 0.9  B-Globulin SerPl Elph-Mcnc     0.7 - 1.3 g/dL 1.2  Gamma Glob  SerPl Elph-Mcnc     0.4 - 1.8 g/dL 1.4  M Protein SerPl Elph-Mcnc     Not Observed g/dL 0.2 (H)  Globulin, Total     2.2 - 3.9 g/dL 3.7  Albumin/Glob SerPl     0.7 - 1.7 1.1  IFE 1      Comment  Please Note (HCV):      Comment  Iron     42 - 163 ug/dL 65  TIBC     202 - 409 ug/dL 350  UIBC     117 - 376 ug/dL 286  %SAT     20 - 55 % 18 (L)  Beta 2     0.6 - 2.4 mg/L 2.2  LDH     125 - 245 U/L 177  Sed Rate     0 - 30 mm/hr 21  CRP     0.0 - 4.9 mg/L 2.8  Ferritin     22 - 316 ng/ml 88   IFE 1  Comment   Comments: Immunofixation shows IgG monoclonal protein with kappa light chain  specificity.           RADIOGRAPHIC STUDIES:  MRI abd w and wo contrast: 12/17/2016: IMPRESSION: 1. Postprocedural changes of thermal ablation again noted in the right lobe of the liver, without definitive evidence to suggest local recurrence of disease. No new hepatic lesions are noted. 2. Additional incidental findings, as above.   Electronically Signed   By: Vinnie Langton M.D.   On: 12/17/2016 09:42  ASSESSMENT & PLAN:   72 y.o. male with  #1 Monoclonal gammopathy of undetermined significance.  M protein 0.2 mg/dL immunofixation showing IgG monoclonal protein with kappa light chain specificity. SPEP 02/2016 - 0.3g/dl  No overt anemia.  No overt hypercalcemia. Stable CKD creatinine 1.6  #2 bone lucencies in the right radial mid midshaft and left humeral head. PET/CT did not show any hypermetabolic bone lesions. MRI left shoulder showed no evidence of metastatic disease or multiple myeloma. The x-ray findings appear to be areas of mild demineralization. Patient was seen by orthopedics and no additional recommendations were given.  Plan -Patient's anemia is stable. No significant change in renal function. No new bone pains. No hypercalcemia . No overall no overt evidence of progression of multiple myeloma. -Myeloma labs from today are in process and are currently  pending.  #3 Hepatocellular carcinoma treated with TACE and percutaneous thermal ablation by interventional radiology.   Last MRI on 08/06/2015 showed slight decrease in the size of the ablation defect involving the right lobe of the liver area and no findings to suggest residual or recurrent hepatocellular carcinoma. Mild changes of liver cirrhosis.  01/16/2016 MRI Abdomen showed resolution of previously ablated lesion but a new 1.3 cm lesion was noted which was subsequently ablated under CT guidance by interventional radiology on 01/31/2016. Elevated transaminases on 02/01/2016 were likely related to his ablation. These have since resolved.  06/24/2016 MRI Abdomen showed no evidence of recurrent hepatocellular carcinoma . 12/17/2016 MRI Abdomen shows no evidence of recurrent hepatocellular carcinoma .  #4 Hepatitis C genotype  1A status post treatment with Viekira and Ribavarin for 24 weeks.   Plan -continue f/u with IR-Dr. Markus Daft for ongoing management of [place].   -f/u on pending AFP tumor marker from today. -Continue follow-up and hepatitis C clinic for continued monitoring and management of his hepatitis C.  #5 Microcytosis with Minimal Anemia - Hgb electrophoresis suggestive of Beta thal trait given relative polycythemia. Some element of iron defi Iron sat 18%, ferritin 88 but could be over estimate in the setting of some liver injury Ferritin level stable at 77 today.  PLAN:   -Discussed pt labwork today, 03/24/18; blood counts are stable, dehydration with increased creatinine to 1.59 -Discussed the 10/26/17 MRI Abdomen which revealed Motion degraded images. Status post thermal ablation of the segment 8 lesion, without enhancement to suggest residual viable tumor. Prior ablation of a segment 7 lesion, grossly unchanged. No convincing central enhancement -Recommend the pt increase his hydration status while on HCTZ/Lisinopril -Pt is clinically stable, as our his labs drawn today, and his last  MRI Abdomen -Continue follow up with Dr. Markus Daft with imaging in September for [facility], and GI Dr. Roosevelt Locks for [place] see pt back in 6 months   RTC with Dr Irene Limbo in 6 months with labs   All of the patients questions were answered with apparent satisfaction. The patient knows to call the clinic with any problems, questions or concerns.  The total time spent in the appt was 15 minutes and more than 50% was on counseling and direct patient cares.    [name] MD MS AAHIVMS [facility] Hematology/Oncology Physician [place]  (Office):       [phone] (Work cell):  [phone] (Fax):           [phone]  I, Baldwin [place], am acting as a scribe for Dr. Irene Limbo  I have reviewed the above documentation for accuracy and completeness, and I agree with the above. Brunetta Genera MD

## 2018-03-24 ENCOUNTER — Inpatient Hospital Stay: Payer: 59

## 2018-03-24 ENCOUNTER — Encounter: Payer: Self-pay | Admitting: Hematology

## 2018-03-24 ENCOUNTER — Inpatient Hospital Stay: Payer: 59 | Attending: Hematology | Admitting: Hematology

## 2018-03-24 VITALS — BP 155/80 | HR 63 | Temp 98.6°F | Resp 18 | Ht 72.0 in | Wt 262.7 lb

## 2018-03-24 DIAGNOSIS — C220 Liver cell carcinoma: Secondary | ICD-10-CM

## 2018-03-24 DIAGNOSIS — D472 Monoclonal gammopathy: Secondary | ICD-10-CM | POA: Insufficient documentation

## 2018-03-24 DIAGNOSIS — Z87891 Personal history of nicotine dependence: Secondary | ICD-10-CM | POA: Diagnosis not present

## 2018-03-24 DIAGNOSIS — D649 Anemia, unspecified: Secondary | ICD-10-CM | POA: Diagnosis not present

## 2018-03-24 DIAGNOSIS — Z8505 Personal history of malignant neoplasm of liver: Secondary | ICD-10-CM | POA: Insufficient documentation

## 2018-03-24 DIAGNOSIS — I129 Hypertensive chronic kidney disease with stage 1 through stage 4 chronic kidney disease, or unspecified chronic kidney disease: Secondary | ICD-10-CM | POA: Diagnosis not present

## 2018-03-24 DIAGNOSIS — B1920 Unspecified viral hepatitis C without hepatic coma: Secondary | ICD-10-CM | POA: Diagnosis not present

## 2018-03-24 DIAGNOSIS — E860 Dehydration: Secondary | ICD-10-CM

## 2018-03-24 LAB — CMP (CANCER CENTER ONLY)
ALT: 12 U/L (ref 0–44)
AST: 15 U/L (ref 15–41)
Albumin: 4.2 g/dL (ref 3.5–5.0)
Alkaline Phosphatase: 44 U/L (ref 38–126)
Anion gap: 10 (ref 5–15)
BUN: 30 mg/dL — ABNORMAL HIGH (ref 8–23)
CO2: 25 mmol/L (ref 22–32)
Calcium: 9.8 mg/dL (ref 8.9–10.3)
Chloride: 105 mmol/L (ref 98–111)
Creatinine: 1.59 mg/dL — ABNORMAL HIGH (ref 0.61–1.24)
GFR, Est AFR Am: 49 mL/min — ABNORMAL LOW (ref >60)
GFR, Estimated: 42 mL/min — ABNORMAL LOW (ref >60)
Glucose, Bld: 127 mg/dL — ABNORMAL HIGH (ref 70–99)
Potassium: 4.8 mmol/L (ref 3.5–5.1)
Sodium: 140 mmol/L (ref 135–145)
Total Bilirubin: 0.5 mg/dL (ref 0.3–1.2)
Total Protein: 8 g/dL (ref 6.5–8.1)

## 2018-03-24 LAB — CBC WITH DIFFERENTIAL/PLATELET
Basophils Absolute: 0 10*3/uL (ref 0.0–0.1)
Basophils Relative: 1 %
Eosinophils Absolute: 0.1 10*3/uL (ref 0.0–0.5)
Eosinophils Relative: 2 %
HCT: 35.6 % — ABNORMAL LOW (ref 38.4–49.9)
Hemoglobin: 11.1 g/dL — ABNORMAL LOW (ref 13.0–17.1)
Lymphocytes Relative: 33 %
Lymphs Abs: 1.9 10*3/uL (ref 0.9–3.3)
MCH: 22.2 pg — ABNORMAL LOW (ref 27.2–33.4)
MCHC: 31.2 g/dL — ABNORMAL LOW (ref 32.0–36.0)
MCV: 71.2 fL — ABNORMAL LOW (ref 79.3–98.0)
Monocytes Absolute: 0.5 10*3/uL (ref 0.1–0.9)
Monocytes Relative: 8 %
Neutro Abs: 3.3 10*3/uL (ref 1.5–6.5)
Neutrophils Relative %: 56 %
Platelets: 242 10*3/uL (ref 140–400)
RBC: 5 MIL/uL (ref 4.20–5.82)
RDW: 15.6 % — ABNORMAL HIGH (ref 11.0–14.6)
WBC: 5.8 10*3/uL (ref 4.0–10.3)

## 2018-03-24 LAB — RETICULOCYTES
RBC.: 5 MIL/uL (ref 4.20–5.82)
Retic Count, Absolute: 60 10*3/uL (ref 34.8–93.9)
Retic Ct Pct: 1.2 % (ref 0.8–1.8)

## 2018-03-25 ENCOUNTER — Telehealth: Payer: Self-pay

## 2018-03-25 LAB — AFP TUMOR MARKER: AFP, Serum, Tumor Marker: 1 ng/mL (ref 0.0–8.3)

## 2018-03-25 NOTE — Telephone Encounter (Signed)
Spoke with patient concerning his upcoming appointment that was scheduled for him in 6 months. Per 8/1 los. Also will be mailing a letter, with a calender enclosed.

## 2018-03-28 LAB — MULTIPLE MYELOMA PANEL, SERUM
Albumin SerPl Elph-Mcnc: 3.7 g/dL (ref 2.9–4.4)
Albumin/Glob SerPl: 1.1 (ref 0.7–1.7)
Alpha 1: 0.2 g/dL (ref 0.0–0.4)
Alpha2 Glob SerPl Elph-Mcnc: 0.9 g/dL (ref 0.4–1.0)
B-Globulin SerPl Elph-Mcnc: 1.2 g/dL (ref 0.7–1.3)
Gamma Glob SerPl Elph-Mcnc: 1.3 g/dL (ref 0.4–1.8)
Globulin, Total: 3.6 g/dL (ref 2.2–3.9)
IgA: 207 mg/dL (ref 61–437)
IgG (Immunoglobin G), Serum: 1272 mg/dL (ref 700–1600)
IgM (Immunoglobulin M), Srm: 68 mg/dL (ref 15–143)
M Protein SerPl Elph-Mcnc: 0.3 g/dL — ABNORMAL HIGH
Total Protein ELP: 7.3 g/dL (ref 6.0–8.5)

## 2018-04-07 ENCOUNTER — Other Ambulatory Visit: Payer: Self-pay | Admitting: Radiology

## 2018-04-13 ENCOUNTER — Other Ambulatory Visit (HOSPITAL_COMMUNITY): Payer: Self-pay | Admitting: Diagnostic Radiology

## 2018-04-13 DIAGNOSIS — C220 Liver cell carcinoma: Secondary | ICD-10-CM

## 2018-04-28 ENCOUNTER — Other Ambulatory Visit: Payer: Self-pay | Admitting: Radiology

## 2018-05-12 ENCOUNTER — Encounter: Payer: Self-pay | Admitting: *Deleted

## 2018-05-12 ENCOUNTER — Ambulatory Visit (HOSPITAL_COMMUNITY)
Admission: RE | Admit: 2018-05-12 | Discharge: 2018-05-12 | Disposition: A | Discharge status: Home / Self Care | Payer: 59 | Source: Ambulatory Visit | Attending: Diagnostic Radiology | Admitting: Diagnostic Radiology

## 2018-05-12 ENCOUNTER — Other Ambulatory Visit (HOSPITAL_COMMUNITY): Payer: Self-pay | Admitting: Diagnostic Radiology

## 2018-05-12 ENCOUNTER — Ambulatory Visit
Admission: RE | Admit: 2018-05-12 | Discharge: 2018-05-12 | Disposition: A | Discharge status: Home / Self Care | Payer: 59 | Source: Ambulatory Visit | Attending: Diagnostic Radiology | Admitting: Diagnostic Radiology

## 2018-05-12 DIAGNOSIS — C220 Liver cell carcinoma: Secondary | ICD-10-CM | POA: Diagnosis not present

## 2018-05-12 DIAGNOSIS — Z9889 Other specified postprocedural states: Secondary | ICD-10-CM | POA: Insufficient documentation

## 2018-05-12 DIAGNOSIS — Z8505 Personal history of malignant neoplasm of liver: Secondary | ICD-10-CM | POA: Diagnosis not present

## 2018-05-12 DIAGNOSIS — K7689 Other specified diseases of liver: Secondary | ICD-10-CM | POA: Diagnosis not present

## 2018-05-12 HISTORY — PX: IR RADIOLOGIST EVAL & MGMT: IMG5224

## 2018-05-12 NOTE — Progress Notes (Signed)
Chief Complaint: Patient was seen in clinic for follow-up of [place].  Referring Physician(s): Alvina Chou     History of Present Illness:   Aaron [name] a 72 y.o.malewith history of hepatitis C and hepatocellular carcinoma diagnosed in 2015. Patienthad DEB-TACE of thelesion on 03/13/2014, thermal ablation of the lesion on 06/01/2014. Patient underwent repeat thermal ablation for tumor recurrence on 02/10/2016. Another lesion was identified in Segment 9 in 2018 andpatient underwent microwave ablation of that lesion on 07/21/2017.  Patient is followed by Dr. Irene Limbo for monoclonal gammopathy of unknown significance.  Patient has no significant complaints at today's visit.  No significant change in appetite or weight.  The patient continues to be very active but has chronic knee problems.  Denies abdominal pain, bowel issues or GU issues.  Patient was scheduled for an MRI with and without contrast this morning prior to the procedure.  Patient refused the intravenous contrast because he was told to avoid intravenous contrast by his primary care physician.  MRI was performed without contrast.  Patient does have a history of renal insufficiency.    Past Medical History:  Diagnosis Date  . Anemia    hx of   . Arthritis   . Diabetes mellitus without complication ([place])    type II   . Hepatitis    hepatitis C  . Hepatocellular carcinoma ([place]) 01/30/2014   Path  . Hyperlipidemia   . Hyperlipidemia   . Hypertension     Past Surgical History:  Procedure Laterality Date  . COLONOSCOPY    . IR GENERIC HISTORICAL  02/26/2016   IR RADIOLOGIST EVAL & MGMT 02/26/2016 Markus Daft, MD [facility] INTERV RAD  . IR GENERIC HISTORICAL  01/16/2016   IR RADIOLOGIST EVAL & MGMT 01/16/2016 Markus Daft, MD [facility] INTERV RAD  . IR GENERIC HISTORICAL  06/24/2016   IR RADIOLOGIST EVAL & MGMT 06/24/2016 Aletta Edouard, MD [facility] INTERV RAD  . IR RADIOLOGIST EVAL & MGMT  12/17/2016  . IR RADIOLOGIST EVAL &  MGMT  06/16/2017  . IR RADIOLOGIST EVAL & MGMT  09/08/2017  . IR RADIOLOGIST EVAL & MGMT  10/26/2017  . IR RADIOLOGIST EVAL & MGMT  05/12/2018  . POLYPECTOMY    . RADIOFREQUENCY ABLATION N/A 07/21/2017   Procedure: CT MICROWAVE THERMAL ABLATION-LIVER;  Surgeon: Markus Daft, MD;  Location: [facility];  Service: Anesthesiology;  Laterality: N/A;    Allergies: Patient has no known allergies.  Medications: Prior to Admission medications   Medication Sig Start Date End Date Taking? Authorizing Provider  ALPRAZolam Duanne Moron) 0.5 MG tablet Take 1 tablet (0.5 mg total) by mouth as directed. Take 2 tablets (1.0 mg total) prior to MR scan.  Make take an additional 0.5 mg po if needed. 10/26/17  Yes Markus Daft, MD  aspirin EC 81 MG tablet Take 81 mg by mouth every morning.    Yes [provider]  cholecalciferol (VITAMIN D) 1000 UNITS tablet Take 1,000 Units by mouth every morning.    Yes [provider]  Insulin Glargine (BASAGLAR KWIKPEN) 100 UNIT/ML SOPN Inject 32 Units into the skin daily after breakfast.   Yes [provider]  lisinopril-hydrochlorothiazide (PRINZIDE,ZESTORETIC) 20-12.5 MG tablet Take 1 tablet by mouth daily.   Yes [provider]  metFORMIN (GLUCOPHAGE) 1000 MG tablet Take 1,000 mg by mouth 2 (two) times daily with a meal.   Yes [provider]  Multiple Vitamin (MULTIVITAMIN WITH MINERALS) TABS tablet Take 1 tablet daily by mouth.  Yes [provider]  simvastatin (ZOCOR) 40 MG tablet Take 20 mg at bedtime by mouth.    Yes [provider]     No family history on file.  Social History   Socioeconomic History  . Marital status: Married    Spouse name: Not on file  . Number of children: Not on file  . Years of education: Not on file  . Highest education level: Not on file  Occupational History  . Not on file  Social Needs  . Financial resource strain: Not on file  . Food insecurity:    Worry: Not on file     Inability: Not on file  . Transportation needs:    Medical: Not on file    Non-medical: Not on file  Tobacco Use  . Smoking status: Former Smoker    Last attempt to quit: 01/30/1994    Years since quitting: 24.2  . Smokeless tobacco: Never Used  . Tobacco comment: stopped 25 yrs ago  Substance and Sexual Activity  . Alcohol use: No    Comment: stoped 30 years ago   . Drug use: No  . Sexual activity: Not Currently  Lifestyle  . Physical activity:    Days per week: Not on file    Minutes per session: Not on file  . Stress: Not on file  Relationships  . Social connections:    Talks on phone: Not on file    Gets together: Not on file    Attends religious service: Not on file    Active member of club or organization: Not on file    Attends meetings of clubs or organizations: Not on file    Relationship status: Not on file  Other Topics Concern  . Not on file  Social History Narrative  . Not on file        Review of Systems  Constitutional: Negative.   Gastrointestinal: Negative.   Genitourinary: Negative.     Vital Signs: BP 124/80   Pulse 80   Temp 98.3 F (36.8 C) (Oral)   Resp 16   Ht 6' (1.829 m)   Wt 118.8 kg   SpO2 97%   BMI 35.53 kg/m   Physical Exam  Constitutional: No distress.  Cardiovascular: Normal rate, regular rhythm and normal heart sounds.  Pulmonary/Chest: Effort normal.  Abdominal: Soft. He exhibits no distension. There is no tenderness.  Vitals reviewed.       Imaging: Mr Abdomen Wo Contrast  Result Date: 05/12/2018 CLINICAL DATA:  History of hepatocellular carcinoma with prior thermal ablations of hepatoma. EXAM: MRI ABDOMEN WITHOUT CONTRAST TECHNIQUE: Multiplanar multisequence MR imaging was performed without the administration of intravenous contrast. COMPARISON:  10/26/2017. FINDINGS: Lower chest: No acute findings. Hepatobiliary: The prior ablation zone defect within segment 7 measures 3.6 x 2.8 cm, image 40/10. Previously this  measured the same. Associated intrinsic T1 hyperintensity is noted as on the previous exam compatible with coagulative necrosis. Prior ablation zone defect within segment 8 measures 2.9 x 2.3 cm, image 47/9. On the coronal images this measures approximately 3 cm, image 28/2. Previously 1.5 cm, image 42/14 of the prior study. Previously this measured 2.6 x 1.5 cm. This area is incompletely characterized due to lack of IV contrast material. Previously noted increased T1 signal within this area has resolved. There is mild increased T2 signal which is new from previous exam. Gallbladder appears normal. No significant biliary ductal dilatation. Pancreas: No mass, inflammatory changes, or other parenchymal abnormality identified. Spleen:  Within normal limits in size and appearance. Adrenals/Urinary Tract: No masses identified. No evidence of hydronephrosis. Stomach/Bowel: Visualized portions within the abdomen are unremarkable. Vascular/Lymphatic: No pathologically enlarged lymph nodes identified. No abdominal aortic aneurysm demonstrated. Other:  None. Musculoskeletal: No suspicious bone lesions identified. IMPRESSION: 1. The ablation zone defect within segment 8 liver appears mildly increased in size in the interval. There is new increased mild T2 signal with resolution of previous increased T1 signal. Clinical significance indeterminate without IV contrast material. Cannot rule out residual tumor. 2. Stable ablation zone defect within segment 7. 3. No new lesions identified. Electronically Signed   By: Kerby Moors M.D.   On: 05/12/2018 11:58   Ir Radiologist Eval & Mgmt  Result Date: 05/12/2018 Please refer to notes tab for details about interventional procedure. (Op Note)   Labs:  CBC: Recent Labs    07/14/17 0833 07/22/17 0408 09/23/17 0838 03/24/18 0917  WBC 5.9 7.9 6.5 5.8  HGB 11.3* 10.7* 11.6* 11.1*  HCT 36.0* 35.0* 38.0* 35.6*  PLT 269 240 285 242    COAGS: Recent Labs     07/14/17 0833  INR 0.93    BMP: Recent Labs    07/14/17 0833 07/21/17 1027 09/23/17 0838 03/24/18 0917  NA 138 137 139 140  K 4.3 4.5 4.8 4.8  CL 105 102 101 105  CO2 24 25 27 25   GLUCOSE 140* 145* 115 127*  BUN 21* 26* 16 30*  CALCIUM 9.5 9.6 9.8 9.8  CREATININE 1.36* 1.38* 1.53* 1.59*  GFRNONAA 51* 50* 44* 42*  GFRAA 59* 58* 51* 49*    LIVER FUNCTION TESTS: Recent Labs    07/14/17 0833 07/21/17 1027 09/23/17 0838 03/24/18 0917  BILITOT 0.7 0.5 0.5 0.5  AST 26 22 19 15   ALT 18 16* 13 12  ALKPHOS 40 41 46 44  PROT 8.0 8.2* 8.2 8.0  ALBUMIN 4.1 4.4 4.2 4.2    TUMOR MARKERS: No results for input(s): AFPTM, CEA, CA199, CHROMGRNA in the last 8760 hours.  Assessment and Plan:  72 year old with history of hepatitis C and hepatocellular carcinoma.  Patient has had 2 areas in the liver treated for [facility].  Most recently treated area is segment 8 and patient underwent microwave ablation of this lesion.  Patient had an MRI today that suggests that this lesion is slightly enlarging in size.  I personally reviewed the MRI images and I am concerned there may be local recurrence in this area.  Unfortunately, the patient did not receive intravenous contrast because he was concerned about his renal function.  Creatinine on 03/24/2018 was 1.59 and this is near his baseline.  I explained that we really need a pre-and postcontrast MRI to thoroughly evaluate for new or recurrent hepatocellular carcinoma.  In particular, I am very concerned about a local recurrence in segment 8 and we briefly discussed another percutaneous ablation of this lesion.  Patient is willing to get a another MRI with contrast if is okay with his primary care physician.  I contacted Dr. Julianne Rice office but unfortunately he is out on medical leave.  I talked to Dr. Julianne Rice nurse who discussed the situation with Dr. Theda Sers.  Dr. Theda Sers agrees that the patient needs IV gadolinium to fully evaluate his liver disease.  We will  schedule the patient for repeat MRI with and without contrast.  If there is concern for another local recurrence then we will need to have a another discussion about a repeat thermal ablation procedure.  If the  MRI is suspicious, I would like to present this patient at GI conference to see if there is any other thoughts on surveillance and treatment.  Electronically Signed: Burman Riis 05/12/2018, 1:35 PM  I spent a total of    15 Minutes in face to face in clinical consultation, greater than 50% of which was counseling/coordinating care for vital cellular carcinoma.  Patient ID: Aaron [name], male   DOB: 22-May-1946, 72 y.o.   MRN: [id number]

## 2018-05-13 ENCOUNTER — Other Ambulatory Visit (HOSPITAL_COMMUNITY): Payer: Self-pay | Admitting: Diagnostic Radiology

## 2018-05-13 DIAGNOSIS — C220 Liver cell carcinoma: Secondary | ICD-10-CM

## 2018-05-20 ENCOUNTER — Ambulatory Visit (HOSPITAL_COMMUNITY)
Admission: RE | Admit: 2018-05-20 | Discharge: 2018-05-20 | Disposition: A | Discharge status: Home / Self Care | Payer: 59 | Source: Ambulatory Visit | Attending: Diagnostic Radiology | Admitting: Diagnostic Radiology

## 2018-05-20 DIAGNOSIS — C220 Liver cell carcinoma: Secondary | ICD-10-CM | POA: Diagnosis not present

## 2018-05-20 DIAGNOSIS — K769 Liver disease, unspecified: Secondary | ICD-10-CM | POA: Diagnosis not present

## 2018-05-20 DIAGNOSIS — K760 Fatty (change of) liver, not elsewhere classified: Secondary | ICD-10-CM | POA: Diagnosis not present

## 2018-05-20 LAB — POCT I-STAT CREATININE: Creatinine, Ser: 1.4 mg/dL — ABNORMAL HIGH (ref 0.61–1.24)

## 2018-05-20 MED ORDER — GADOXETATE DISODIUM 0.25 MMOL/ML IV SOLN
10.0000 mL | Freq: Once | INTRAVENOUS | Status: AC | PRN
  Administered 2018-05-20: 10 mL via INTRAVENOUS

## 2018-06-01 ENCOUNTER — Other Ambulatory Visit (HOSPITAL_COMMUNITY): Payer: Self-pay | Admitting: Diagnostic Radiology

## 2018-06-01 DIAGNOSIS — C220 Liver cell carcinoma: Secondary | ICD-10-CM

## 2018-06-02 ENCOUNTER — Encounter: Payer: Self-pay | Admitting: Diagnostic Radiology

## 2018-06-02 NOTE — Progress Notes (Signed)
Patient ID: Aaron [name], male   DOB: October 05, 1945, 72 y.o.   MRN: [id number] I reviewed the most recent abdominal MRI report and images.  Evidence for an enhancing lesion adjacent to the segment 8 ablation site and concerning for another HCC.  I discussed findings with patient and explained the options which include another ablation vs. surgical consultation. Patient is agreeable to another percutaneous ablation procedure.  We had him scheduled for 06/08/18 but patient won't be able to make that date work.   He has been scheduled for November.  Planning to present and discuss patient's case at the next GI tumor conference.

## 2018-06-03 NOTE — Progress Notes (Signed)
Thanks, [name]

## 2018-06-17 ENCOUNTER — Encounter (HOSPITAL_COMMUNITY): Payer: Self-pay

## 2018-06-17 ENCOUNTER — Other Ambulatory Visit: Payer: Self-pay | Admitting: Student

## 2018-06-17 NOTE — Patient Instructions (Signed)
Your procedure is scheduled on: Wednesday, Nov. 6, 2019   Surgery Time:  8:30AM-11:30AM   Report to [facility] Main  Entrance    Report to admitting at 6:30 AM   Call this number if you have problems the morning of surgery [phone]   Do not eat food or drink liquids :After Midnight.   Brush your teeth the morning of surgery.   Do NOT smoke after Midnight   Take these medicines the morning of surgery with A SIP OF WATER: 1/2 (half) Insulin Glargine (Basaglar) dose   Use eye drops per normal routine  DO NOT TAKE ANY DIABETIC MEDICATIONS DAY OF YOUR SURGERY                               You may not have any metal on your body including jewelry, and body piercings             Do not wear lotions, powders, perfumes/cologne, or deodorant                          Men may shave face and neck.   Do not bring valuables to the hospital. [place].   Contacts, dentures or bridgework may not be worn into surgery.   Leave suitcase in the car. After surgery it may be brought to your room.    Special Instructions: Bring a copy of your healthcare power of attorney and living will documents         the day of surgery if you haven't scanned them in before.              Please read over the following fact sheets you were given:  [facility] - Preparing for Surgery Before surgery, you can play an important role.  Because skin is not sterile, your skin needs to be as free of germs as possible.  You can reduce the number of germs on your skin by washing with CHG (chlorahexidine gluconate) soap before surgery.  CHG is an antiseptic cleaner which kills germs and bonds with the skin to continue killing germs even after washing. Please DO NOT use if you have an allergy to CHG or antibacterial soaps.  If your skin becomes reddened/irritated stop using the CHG and inform your nurse when you arrive at Short Stay. Do not shave (including  legs and underarms) for at least 48 hours prior to the first CHG shower.  You may shave your face/neck.  Please follow these instructions carefully:  1.  Shower with CHG Soap the night before surgery and the  morning of surgery.  2.  If you choose to wash your hair, wash your hair first as usual with your normal  shampoo.  3.  After you shampoo, rinse your hair and body thoroughly to remove the shampoo.                             4.  Use CHG as you would any other liquid soap.  You can apply chg directly to the skin and wash.  Gently with a scrungie or clean washcloth.  5.  Apply the CHG Soap to your body ONLY FROM THE NECK DOWN.   Do   not  use on face/ open                           Wound or open sores. Avoid contact with eyes, ears mouth and   genitals (private parts).                       Wash face,  Genitals (private parts) with your normal soap.             6.  Wash thoroughly, paying special attention to the area where your    surgery  will be performed.  7.  Thoroughly rinse your body with warm water from the neck down.  8.  DO NOT shower/wash with your normal soap after using and rinsing off the CHG Soap.                9.  Pat yourself dry with a clean towel.            10.  Wear clean pajamas.            11.  Place clean sheets on your bed the night of your first shower and do not  sleep with pets. Day of Surgery : Do not apply any lotions/deodorants the morning of surgery.  Please wear clean clothes to the hospital/surgery center.  FAILURE TO FOLLOW THESE INSTRUCTIONS MAY RESULT IN THE CANCELLATION OF YOUR SURGERY  PATIENT SIGNATURE_________________________________  NURSE SIGNATURE__________________________________  ________________________________________________________________________

## 2018-06-21 ENCOUNTER — Encounter (HOSPITAL_COMMUNITY)
Admission: RE | Admit: 2018-06-21 | Discharge: 2018-06-21 | Disposition: A | Discharge status: Home / Self Care | Payer: 59 | Source: Ambulatory Visit | Attending: Diagnostic Radiology | Admitting: Diagnostic Radiology

## 2018-06-21 ENCOUNTER — Other Ambulatory Visit: Payer: Self-pay

## 2018-06-21 ENCOUNTER — Encounter (HOSPITAL_COMMUNITY): Payer: Self-pay

## 2018-06-21 DIAGNOSIS — Z01812 Encounter for preprocedural laboratory examination: Secondary | ICD-10-CM | POA: Diagnosis not present

## 2018-06-21 HISTORY — DX: Personal history of colonic polyps: Z86.010

## 2018-06-21 HISTORY — DX: Fatty (change of) liver, not elsewhere classified: K76.0

## 2018-06-21 HISTORY — DX: Personal history of colon polyps, unspecified: Z86.0100

## 2018-06-21 HISTORY — DX: Monoclonal gammopathy: D47.2

## 2018-06-21 HISTORY — DX: Iron deficiency anemia, unspecified: D50.9

## 2018-06-21 HISTORY — DX: Abnormal levels of other serum enzymes: R74.8

## 2018-06-21 HISTORY — DX: Unspecified viral hepatitis C without hepatic coma: B19.20

## 2018-06-21 LAB — HEMOGLOBIN A1C
Hgb A1c MFr Bld: 7.6 % — ABNORMAL HIGH (ref 4.8–5.6)
Mean Plasma Glucose: 171.42 mg/dL

## 2018-06-21 LAB — COMPREHENSIVE METABOLIC PANEL
ALT: 18 U/L (ref 0–44)
AST: 24 U/L (ref 15–41)
Albumin: 4.2 g/dL (ref 3.5–5.0)
Alkaline Phosphatase: 36 U/L — ABNORMAL LOW (ref 38–126)
Anion gap: 10 (ref 5–15)
BUN: 23 mg/dL (ref 8–23)
CO2: 27 mmol/L (ref 22–32)
Calcium: 9.5 mg/dL (ref 8.9–10.3)
Chloride: 101 mmol/L (ref 98–111)
Creatinine, Ser: 1.53 mg/dL — ABNORMAL HIGH (ref 0.61–1.24)
GFR calc Af Amer: 51 mL/min — ABNORMAL LOW (ref >60)
GFR calc non Af Amer: 44 mL/min — ABNORMAL LOW (ref >60)
Glucose, Bld: 149 mg/dL — ABNORMAL HIGH (ref 70–99)
Potassium: 4.5 mmol/L (ref 3.5–5.1)
Sodium: 138 mmol/L (ref 135–145)
Total Bilirubin: 0.6 mg/dL (ref 0.3–1.2)
Total Protein: 7.9 g/dL (ref 6.5–8.1)

## 2018-06-21 LAB — CBC WITH DIFFERENTIAL/PLATELET
Abs Immature Granulocytes: 0.02 10*3/uL (ref 0.00–0.07)
Basophils Absolute: 0 10*3/uL (ref 0.0–0.1)
Basophils Relative: 0 %
Eosinophils Absolute: 0.1 10*3/uL (ref 0.0–0.5)
Eosinophils Relative: 1 %
HCT: 39.7 % (ref 39.0–52.0)
Hemoglobin: 11.7 g/dL — ABNORMAL LOW (ref 13.0–17.0)
Immature Granulocytes: 0 %
Lymphocytes Relative: 31 %
Lymphs Abs: 1.6 10*3/uL (ref 0.7–4.0)
MCH: 21.6 pg — ABNORMAL LOW (ref 26.0–34.0)
MCHC: 29.5 g/dL — ABNORMAL LOW (ref 30.0–36.0)
MCV: 73.2 fL — ABNORMAL LOW (ref 80.0–100.0)
Monocytes Absolute: 0.5 10*3/uL (ref 0.1–1.0)
Monocytes Relative: 9 %
Neutro Abs: 3.1 10*3/uL (ref 1.7–7.7)
Neutrophils Relative %: 59 %
Platelets: 287 10*3/uL (ref 150–400)
RBC: 5.42 MIL/uL (ref 4.22–5.81)
RDW: 15.5 % (ref 11.5–15.5)
WBC: 5.2 10*3/uL (ref 4.0–10.5)
nRBC: 0 % (ref 0.0–0.2)

## 2018-06-21 LAB — GLUCOSE, CAPILLARY: Glucose-Capillary: 144 mg/dL — ABNORMAL HIGH (ref 70–99)

## 2018-06-21 NOTE — Pre-Procedure Instructions (Signed)
Cbc/diff, CMP, HgbA1C results 06/21/2018 sent to Dr. Anselm Pancoast via epic.

## 2018-06-28 ENCOUNTER — Other Ambulatory Visit: Payer: Self-pay | Admitting: Radiology

## 2018-06-28 NOTE — Anesthesia Preprocedure Evaluation (Addendum)
Anesthesia Evaluation  Patient identified by MRN, date of birth, ID band Patient awake    Reviewed: Allergy & Precautions, NPO status , Patient's Chart, lab work & pertinent test results  History of Anesthesia Complications Negative for: history of anesthetic complications  Airway Mallampati: II  TM Distance: >3 FB Neck ROM: Full    Dental no notable dental hx. (+) Edentulous Upper, Edentulous Lower, Upper Dentures, Lower Dentures   Pulmonary neg pulmonary ROS, former smoker,    Pulmonary exam normal breath sounds clear to auscultation       Cardiovascular hypertension, Pt. on medications Normal cardiovascular exam Rhythm:Regular Rate:Normal     Neuro/Psych negative neurological ROS     GI/Hepatic negative GI ROS, (+) Hepatitis -, C  Endo/Other  diabetes, Type 2, Oral Hypoglycemic Agents, Insulin DependentTook 32 units insulin this morning  Renal/GU Renal InsufficiencyRenal disease     Musculoskeletal negative musculoskeletal ROS (+)   Abdominal   Peds  Hematology negative hematology ROS (+) anemia , Hgb 11.7 on 06/21/18   Anesthesia Other Findings Day of surgery medications reviewed with the patient.  Reproductive/Obstetrics                            Anesthesia Physical Anesthesia Plan  ASA: III  Anesthesia Plan: General   Post-op Pain Management:    Induction: Intravenous  PONV Risk Score and Plan: 2 and Treatment may vary due to age or medical condition, Ondansetron and Dexamethasone  Airway Management Planned: Oral ETT  Additional Equipment:   Intra-op Plan:   Post-operative Plan: Extubation in OR  Informed Consent: I have reviewed the patients History and Physical, chart, labs and discussed the procedure including the risks, benefits and alternatives for the proposed anesthesia with the patient or authorized representative who has indicated his/her understanding and  acceptance.   Dental advisory given  Plan Discussed with: CRNA  Anesthesia Plan Comments:        Anesthesia Quick Evaluation

## 2018-06-29 ENCOUNTER — Other Ambulatory Visit: Payer: Self-pay

## 2018-06-29 ENCOUNTER — Encounter (HOSPITAL_COMMUNITY)
Admission: RE | Disposition: A | Discharge status: Home / Self Care | Payer: Self-pay | Source: Ambulatory Visit | Attending: Diagnostic Radiology

## 2018-06-29 ENCOUNTER — Ambulatory Visit (HOSPITAL_COMMUNITY)
Admission: RE | Admit: 2018-06-29 | Discharge: 2018-06-29 | Disposition: A | Discharge status: Home / Self Care | Payer: 59 | Source: Ambulatory Visit | Attending: Diagnostic Radiology | Admitting: Diagnostic Radiology

## 2018-06-29 ENCOUNTER — Ambulatory Visit (HOSPITAL_COMMUNITY): Payer: 59 | Admitting: Anesthesiology

## 2018-06-29 ENCOUNTER — Encounter (HOSPITAL_COMMUNITY): Payer: Self-pay

## 2018-06-29 ENCOUNTER — Observation Stay (HOSPITAL_COMMUNITY)
Admission: RE | Admit: 2018-06-29 | Discharge: 2018-06-30 | Disposition: A | Discharge status: Home / Self Care | Payer: 59 | Source: Ambulatory Visit | Attending: Diagnostic Radiology | Admitting: Diagnostic Radiology

## 2018-06-29 ENCOUNTER — Encounter (HOSPITAL_COMMUNITY): Payer: Self-pay | Admitting: *Deleted

## 2018-06-29 DIAGNOSIS — E785 Hyperlipidemia, unspecified: Secondary | ICD-10-CM | POA: Diagnosis not present

## 2018-06-29 DIAGNOSIS — C220 Liver cell carcinoma: Secondary | ICD-10-CM | POA: Diagnosis present

## 2018-06-29 DIAGNOSIS — D649 Anemia, unspecified: Secondary | ICD-10-CM | POA: Insufficient documentation

## 2018-06-29 DIAGNOSIS — M1990 Unspecified osteoarthritis, unspecified site: Secondary | ICD-10-CM | POA: Diagnosis not present

## 2018-06-29 DIAGNOSIS — E119 Type 2 diabetes mellitus without complications: Secondary | ICD-10-CM | POA: Insufficient documentation

## 2018-06-29 DIAGNOSIS — B1920 Unspecified viral hepatitis C without hepatic coma: Secondary | ICD-10-CM | POA: Diagnosis not present

## 2018-06-29 DIAGNOSIS — Z23 Encounter for immunization: Secondary | ICD-10-CM | POA: Diagnosis not present

## 2018-06-29 DIAGNOSIS — Z7982 Long term (current) use of aspirin: Secondary | ICD-10-CM | POA: Diagnosis not present

## 2018-06-29 DIAGNOSIS — Z794 Long term (current) use of insulin: Secondary | ICD-10-CM | POA: Insufficient documentation

## 2018-06-29 DIAGNOSIS — Z87891 Personal history of nicotine dependence: Secondary | ICD-10-CM | POA: Diagnosis not present

## 2018-06-29 DIAGNOSIS — I10 Essential (primary) hypertension: Secondary | ICD-10-CM | POA: Insufficient documentation

## 2018-06-29 HISTORY — PX: RADIOFREQUENCY ABLATION: SHX2290

## 2018-06-29 LAB — GLUCOSE, CAPILLARY
Glucose-Capillary: 132 mg/dL — ABNORMAL HIGH (ref 70–99)
Glucose-Capillary: 139 mg/dL — ABNORMAL HIGH (ref 70–99)
Glucose-Capillary: 135 mg/dL — ABNORMAL HIGH (ref 70–99)
Glucose-Capillary: 137 mg/dL — ABNORMAL HIGH (ref 70–99)
Glucose-Capillary: 313 mg/dL — ABNORMAL HIGH (ref 70–99)
Glucose-Capillary: 217 mg/dL — ABNORMAL HIGH (ref 70–99)
Glucose-Capillary: 128 mg/dL — ABNORMAL HIGH (ref 70–99)

## 2018-06-29 LAB — TYPE AND SCREEN
ABO/RH(D): B POS
Antibody Screen: NEGATIVE

## 2018-06-29 LAB — COMPREHENSIVE METABOLIC PANEL
ALT: 20 U/L (ref 0–44)
AST: 24 U/L (ref 15–41)
Albumin: 4.6 g/dL (ref 3.5–5.0)
Alkaline Phosphatase: 38 U/L (ref 38–126)
Anion gap: 12 (ref 5–15)
BUN: 23 mg/dL (ref 8–23)
CO2: 28 mmol/L (ref 22–32)
Calcium: 10 mg/dL (ref 8.9–10.3)
Chloride: 99 mmol/L (ref 98–111)
Creatinine, Ser: 1.48 mg/dL — ABNORMAL HIGH (ref 0.61–1.24)
GFR calc Af Amer: 53 mL/min — ABNORMAL LOW (ref >60)
GFR calc non Af Amer: 46 mL/min — ABNORMAL LOW (ref >60)
Glucose, Bld: 140 mg/dL — ABNORMAL HIGH (ref 70–99)
Potassium: 4.1 mmol/L (ref 3.5–5.1)
Sodium: 139 mmol/L (ref 135–145)
Total Bilirubin: 0.6 mg/dL (ref 0.3–1.2)
Total Protein: 8.3 g/dL — ABNORMAL HIGH (ref 6.5–8.1)

## 2018-06-29 LAB — PROTIME-INR
INR: 0.96
Prothrombin Time: 12.7 seconds (ref 11.4–15.2)

## 2018-06-29 LAB — CBC
HCT: 39.8 % (ref 39.0–52.0)
Hemoglobin: 11.6 g/dL — ABNORMAL LOW (ref 13.0–17.0)
MCH: 21.3 pg — ABNORMAL LOW (ref 26.0–34.0)
MCHC: 29.1 g/dL — ABNORMAL LOW (ref 30.0–36.0)
MCV: 73 fL — ABNORMAL LOW (ref 80.0–100.0)
Platelets: 269 10*3/uL (ref 150–400)
RBC: 5.45 MIL/uL (ref 4.22–5.81)
RDW: 15.4 % (ref 11.5–15.5)
WBC: 5.3 10*3/uL (ref 4.0–10.5)
nRBC: 0 % (ref 0.0–0.2)

## 2018-06-29 LAB — APTT: aPTT: 31 seconds (ref 24–36)

## 2018-06-29 SURGERY — RADIO FREQUENCY ABLATION
Anesthesia: General

## 2018-06-29 MED ORDER — FENTANYL CITRATE (PF) 250 MCG/5ML IJ SOLN
INTRAMUSCULAR | Status: AC
  Filled 2018-06-29: qty 5

## 2018-06-29 MED ORDER — LACTATED RINGERS IV SOLN
INTRAVENOUS | Status: DC
  Administered 2018-06-29 (×2): via INTRAVENOUS

## 2018-06-29 MED ORDER — SODIUM CHLORIDE 0.9 % IV SOLN
INTRAVENOUS | Status: AC
  Filled 2018-06-29: qty 1000

## 2018-06-29 MED ORDER — SODIUM CHLORIDE 0.9 % IV SOLN
INTRAVENOUS | Status: AC
  Filled 2018-06-29: qty 250

## 2018-06-29 MED ORDER — PIPERACILLIN-TAZOBACTAM 3.375 G IVPB
3.3750 g | Freq: Once | INTRAVENOUS | Status: AC
  Administered 2018-06-29: 3.375 g via INTRAVENOUS
  Filled 2018-06-29: qty 50

## 2018-06-29 MED ORDER — LISINOPRIL-HYDROCHLOROTHIAZIDE 20-12.5 MG PO TABS: 1.0000 | ORAL_TABLET | ORAL | Status: DC

## 2018-06-29 MED ORDER — HYDROCHLOROTHIAZIDE 12.5 MG PO CAPS
12.5000 mg | ORAL_CAPSULE | Freq: Every day | ORAL | Status: DC
  Administered 2018-06-29 – 2018-06-30 (×2): 12.5 mg via ORAL
  Filled 2018-06-29 (×2): qty 1

## 2018-06-29 MED ORDER — PHENYLEPHRINE 40 MCG/ML (10ML) SYRINGE FOR IV PUSH (FOR BLOOD PRESSURE SUPPORT)
PREFILLED_SYRINGE | INTRAVENOUS | Status: DC | PRN
  Administered 2018-06-29: 80 ug via INTRAVENOUS

## 2018-06-29 MED ORDER — MIDAZOLAM HCL 2 MG/2ML IJ SOLN
INTRAMUSCULAR | Status: AC
  Filled 2018-06-29: qty 2

## 2018-06-29 MED ORDER — CHOLECALCIFEROL 10 MCG (400 UNIT) PO TABS
1000.0000 [IU] | ORAL_TABLET | Freq: Every day | ORAL | Status: DC
  Filled 2018-06-29: qty 3

## 2018-06-29 MED ORDER — ROCURONIUM BROMIDE 10 MG/ML (PF) SYRINGE
PREFILLED_SYRINGE | INTRAVENOUS | Status: DC | PRN
  Administered 2018-06-29: 10 mg via INTRAVENOUS
  Administered 2018-06-29: 70 mg via INTRAVENOUS

## 2018-06-29 MED ORDER — OXYCODONE HCL 5 MG PO TABS: 5.0000 mg | ORAL_TABLET | ORAL | Status: DC | PRN

## 2018-06-29 MED ORDER — LIDOCAINE 2% (20 MG/ML) 5 ML SYRINGE
INTRAMUSCULAR | Status: DC | PRN
  Administered 2018-06-29: 60 mg via INTRAVENOUS

## 2018-06-29 MED ORDER — ONDANSETRON HCL 4 MG/2ML IJ SOLN
INTRAMUSCULAR | Status: DC | PRN
  Administered 2018-06-29: 4 mg via INTRAVENOUS

## 2018-06-29 MED ORDER — LISINOPRIL 20 MG PO TABS
20.0000 mg | ORAL_TABLET | Freq: Every day | ORAL | Status: DC
  Administered 2018-06-29 – 2018-06-30 (×2): 20 mg via ORAL
  Filled 2018-06-29 (×2): qty 1

## 2018-06-29 MED ORDER — INSULIN ASPART 100 UNIT/ML ~~LOC~~ SOLN
8.0000 [IU] | Freq: Once | SUBCUTANEOUS | Status: AC
  Administered 2018-06-29: 8 [IU] via SUBCUTANEOUS

## 2018-06-29 MED ORDER — DOCUSATE SODIUM 100 MG PO CAPS
100.0000 mg | ORAL_CAPSULE | Freq: Two times a day (BID) | ORAL | Status: DC
  Administered 2018-06-29 – 2018-06-30 (×2): 100 mg via ORAL
  Filled 2018-06-29 (×2): qty 1

## 2018-06-29 MED ORDER — BASAGLAR KWIKPEN 100 UNIT/ML ~~LOC~~ SOPN: 32.0000 [IU] | PEN_INJECTOR | Freq: Every day | SUBCUTANEOUS | Status: DC

## 2018-06-29 MED ORDER — LACTATED RINGERS IV SOLN: INTRAVENOUS | Status: DC

## 2018-06-29 MED ORDER — SIMVASTATIN 20 MG PO TABS
20.0000 mg | ORAL_TABLET | Freq: Every evening | ORAL | Status: DC
  Administered 2018-06-29: 20 mg via ORAL
  Filled 2018-06-29: qty 1

## 2018-06-29 MED ORDER — INSULIN ASPART 100 UNIT/ML ~~LOC~~ SOLN
0.0000 [IU] | Freq: Three times a day (TID) | SUBCUTANEOUS | Status: DC
  Administered 2018-06-29: 8 [IU] via SUBCUTANEOUS

## 2018-06-29 MED ORDER — ADULT MULTIVITAMIN W/MINERALS CH
1.0000 | ORAL_TABLET | Freq: Every day | ORAL | Status: DC
  Administered 2018-06-29 – 2018-06-30 (×2): 1 via ORAL
  Filled 2018-06-29 (×2): qty 1

## 2018-06-29 MED ORDER — INFLUENZA VAC SPLIT HIGH-DOSE 0.5 ML IM SUSY
0.5000 mL | PREFILLED_SYRINGE | INTRAMUSCULAR | Status: AC
  Administered 2018-06-30: 0.5 mL via INTRAMUSCULAR
  Filled 2018-06-29: qty 0.5

## 2018-06-29 MED ORDER — FENTANYL CITRATE (PF) 250 MCG/5ML IJ SOLN
INTRAMUSCULAR | Status: DC | PRN
  Administered 2018-06-29 (×3): 50 ug via INTRAVENOUS
  Administered 2018-06-29: 100 ug via INTRAVENOUS

## 2018-06-29 MED ORDER — PROMETHAZINE HCL 25 MG/ML IJ SOLN: 6.2500 mg | INTRAMUSCULAR | Status: DC | PRN

## 2018-06-29 MED ORDER — ONDANSETRON HCL 4 MG/2ML IJ SOLN: 4.0000 mg | Freq: Four times a day (QID) | INTRAMUSCULAR | Status: DC | PRN

## 2018-06-29 MED ORDER — METFORMIN HCL 500 MG PO TABS
1000.0000 mg | ORAL_TABLET | Freq: Two times a day (BID) | ORAL | Status: DC
  Administered 2018-06-29 – 2018-06-30 (×2): 1000 mg via ORAL
  Filled 2018-06-29 (×2): qty 2

## 2018-06-29 MED ORDER — DEXAMETHASONE SODIUM PHOSPHATE 10 MG/ML IJ SOLN
INTRAMUSCULAR | Status: DC | PRN
  Administered 2018-06-29: 4 mg via INTRAVENOUS

## 2018-06-29 MED ORDER — FENTANYL CITRATE (PF) 100 MCG/2ML IJ SOLN: 25.0000 ug | INTRAMUSCULAR | Status: DC | PRN

## 2018-06-29 MED ORDER — SUGAMMADEX SODIUM 200 MG/2ML IV SOLN
INTRAVENOUS | Status: DC | PRN
  Administered 2018-06-29: 300 mg via INTRAVENOUS

## 2018-06-29 MED ORDER — PROPOFOL 10 MG/ML IV BOLUS
INTRAVENOUS | Status: DC | PRN
  Administered 2018-06-29: 20 mg via INTRAVENOUS
  Administered 2018-06-29: 200 mg via INTRAVENOUS

## 2018-06-29 MED ORDER — CHOLECALCIFEROL 10 MCG (400 UNIT) PO TABS
1000.0000 [IU] | ORAL_TABLET | ORAL | Status: DC
  Filled 2018-06-29: qty 3

## 2018-06-29 MED ORDER — INSULIN GLARGINE 100 UNIT/ML ~~LOC~~ SOLN
32.0000 [IU] | Freq: Every day | SUBCUTANEOUS | Status: DC
  Administered 2018-06-30: 32 [IU] via SUBCUTANEOUS
  Filled 2018-06-29: qty 0.32

## 2018-06-29 NOTE — Anesthesia Procedure Notes (Signed)
Procedure Name: Intubation Date/Time: 06/29/2018 9:07 AM Performed by: Niel Hummer, CRNA Pre-anesthesia Checklist: Patient identified, Emergency Drugs available, Suction available and Patient being monitored Patient Re-evaluated:Patient Re-evaluated prior to induction Oxygen Delivery Method: Circle system utilized Preoxygenation: Pre-oxygenation with 100% oxygen Induction Type: IV induction Ventilation: Mask ventilation without difficulty and Oral airway inserted - appropriate to patient size Grade View: Grade I Tube type: Oral Tube size: 7.5 mm Number of attempts: 1 Airway Equipment and Method: Stylet Placement Confirmation: positive ETCO2,  ETT inserted through vocal cords under direct vision and breath sounds checked- equal and bilateral Secured at: 22 cm Tube secured with: Tape Dental Injury: Teeth and Oropharynx as per pre-operative assessment

## 2018-06-29 NOTE — Progress Notes (Signed)
Referring Physician(s): Dr. Suzan Slick Kale/Dr. Jani Gravel  Supervising Physician: Markus Daft  Patient Status:  [facility] - In-pt  Chief Complaint: Follow-up MWA   Subjective:  Patient doing well after liver lesion MWA this morning with Dr. Anselm Pancoast - no complaints, comfortably watching TV with his wife. No bleeding noted from insertion sites per RN and patient.  Allergies: Patient has no known allergies.  Medications: Prior to Admission medications   Medication Sig Start Date End Date Taking? Authorizing Provider  aspirin EC 81 MG tablet Take 81 mg by mouth every morning.    Yes [provider]  cholecalciferol (VITAMIN D) 1000 UNITS tablet Take 1,000 Units by mouth every morning.    Yes [provider]  Insulin Glargine (BASAGLAR KWIKPEN) 100 UNIT/ML SOPN Inject 32 Units into the skin daily after breakfast.   Yes [provider]  lisinopril-hydrochlorothiazide (PRINZIDE,ZESTORETIC) 20-12.5 MG tablet Take 1 tablet by mouth every morning.    Yes [provider]  metFORMIN (GLUCOPHAGE) 1000 MG tablet Take 1,000 mg by mouth 2 (two) times daily with a meal.   Yes [provider]  Multiple Vitamin (MULTIVITAMIN WITH MINERALS) TABS tablet Take 1 tablet daily by mouth.   Yes [provider]  PAZEO 0.7 % SOLN Place 1 drop into both eyes every morning.  05/09/18  Yes [provider]  simvastatin (ZOCOR) 40 MG tablet Take 20 mg by mouth every evening.    Yes [provider]  ALPRAZolam (XANAX) 0.5 MG tablet Take 1 tablet (0.5 mg total) by mouth as directed. Take 2 tablets (1.0 mg total) prior to MR scan.  Make take an additional 0.5 mg po if needed. Patient not taking: Reported on 06/14/2018 10/26/17   Markus Daft, MD     Vital Signs: BP (!) 154/76 (BP Location: Left Arm)   Pulse 80   Temp 98.1 F (36.7 C) (Oral)   Resp 15   Ht 6' (1.829 m)   Wt 270 lb (122.5 kg)   SpO2 96%   BMI 36.62 kg/m   Physical Exam  Constitutional:  No distress.  Wife and RN at bedside. Patient examined with Dr. Anselm Pancoast  HENT:  Head: Normocephalic.  Pulmonary/Chest: Effort normal.  Abdominal: There is no tenderness.  RUQ insertion sites dressed appropriately without active bleeding or discharge noted. No pain on palpation.  Neurological: He is alert.  Skin: Skin is warm and dry. He is not diaphoretic.  Vitals reviewed.   Imaging: Ct Guide Tissue Ablation  Result Date: 06/29/2018 INDICATION: 72 year old with biopsy-proven hepatocellular carcinoma with local recurrences and development of new small lesions. Patient has undergone liver-directed therapy with chemoembolization and percutaneous microwave ablations. There is a new lesion adjacent to previously treated segment 8 lesion. This lesion is amendable to percutaneous ablation. EXAM: CT-GUIDED MICROWAVE ABLATION OF A HEPATIC LESION COMPARISON:  MRI 05/20/2018 MEDICATIONS: Zosyn 3.375 g; The antibiotic was administered in an appropriate time interval prior to needle puncture of the skin. ANESTHESIA/SEDATION: General - as administered by the Anesthesia department FLUOROSCOPY TIME:  None COMPLICATIONS: None immediate. TECHNIQUE: Informed written consent was obtained from the patient after a thorough discussion of the procedural risks, benefits and alternatives. A timeout was performed prior to the initiation of the procedure. Patient was placed under general anesthesia. Patient was placed supine on the CT scanner. Liver lesion was identified with ultrasound and CT images through the abdomen were obtained. The lesion was also visible with CT. Right upper abdomen was prepped and draped in  sterile fashion. Maximal barrier sterile technique was utilized including caps, mask, sterile gowns, sterile gloves, sterile drape, hand hygiene and skin antiseptic. 21 gauge needle was directed into the liver lesion with ultrasound guidance and needle position was confirmed with CT. This needle was removed. A 17 gauge  PR Neuwave probe was directed into the lesion using ultrasound and CT guidance. A second 17 gauge PR probe was directed into the lesion slightly more cephalad. Multiple CT images were obtained to confirm correct probe placement. Both probes were traversing the lesion and the tips were through the lesion. A suitable percutaneous window for biopsy was not found following placement of the microwave probes. Therefore, biopsy was not performed. The microwave ablation was performed for total of 10 minutes. CT imaging was performed approximately 5 minutes during the ablation. The needles were removed using the cautery function. Follow up CT images were obtained. Bandages placed at the puncture sites. FINDINGS: Slightly hypoechoic lesion in segment 8 near the previous ablation site. This lesion is relatively isoechoic but heterogeneous on ultrasound. Microwave probes were placed using a combination of ultrasound and CT guidance. Probe positioning within the lesion was confirmed with ultrasound. Treatment of the lesion was also confirmed with ultrasound during the ablation. Expected gas in the liver at the end of the procedure. No significant perihepatic bleeding or hematoma formation. IMPRESSION: Successful image guided microwave ablation of the new hepatic lesion. Electronically Signed   By: Markus Daft M.D.   On: 06/29/2018 13:19    Labs:  CBC: Recent Labs    09/23/17 0838 03/24/18 0917 06/21/18 0837 06/29/18 0733  WBC 6.5 5.8 5.2 5.3  HGB 11.6* 11.1* 11.7* 11.6*  HCT 38.0* 35.6* 39.7 39.8  PLT 285 242 287 269    COAGS: Recent Labs    07/14/17 0833 06/29/18 0733  INR 0.93 0.96  APTT  --  31    BMP: Recent Labs    09/23/17 0838 03/24/18 0917 05/20/18 0717 06/21/18 0837 06/29/18 0733  NA 139 140  --  138 139  K 4.8 4.8  --  4.5 4.1  CL 101 105  --  101 99  CO2 27 25  --  27 28  GLUCOSE 115 127*  --  149* 140*  BUN 16 30*  --  23 23  CALCIUM 9.8 9.8  --  9.5 10.0  CREATININE 1.53*  1.59* 1.40* 1.53* 1.48*  GFRNONAA 44* 42*  --  44* 46*  GFRAA 51* 49*  --  51* 53*    LIVER FUNCTION TESTS: Recent Labs    09/23/17 0838 03/24/18 0917 06/21/18 0837 06/29/18 0733  BILITOT 0.5 0.5 0.6 0.6  AST 19 15 24 24   ALT 13 12 18 20   ALKPHOS 46 44 36* 38  PROT 8.2 8.0 7.9 8.3*  ALBUMIN 4.2 4.2 4.2 4.6    Assessment and Plan:   Follow up successful liver MWA this morning with Dr. Anselm Pancoast - no post procedure complications noted. Patient denies pain, nausea/vomiting. No bleeding or discharge from RUQ insertion sites noted.   May d/c Foley at RN shift change. Patient will stay overnight for observation, will check AM labs and follow up with patient prior to d/c likely in AM.  Follow up with Dr. Anselm Pancoast in IR clinic in 1 month.   Electronically Signed: Joaquim Nam, PA-C 06/29/2018, 5:03 PM   I spent a total of 15 Minutes at the the patient's bedside AND on the patient's hospital floor or unit, greater than 50%  of which was counseling/coordinating care for microwave ablation.

## 2018-06-29 NOTE — H&P (Deleted)
  The note originally documented on this encounter has been moved the the encounter in which it belongs.  

## 2018-06-29 NOTE — Procedures (Signed)
  Pre-operative Diagnosis: Hepatocellular carcinoma        Post-operative Diagnosis: Hepatocellular carcinoma   Indications: New hepatic lesion  Procedure: CT and US guided microwave ablation of liver lesion  Findings: Lesion in Segment 8.  Two PR Neuwave probes placed in lesion.  Treatment for 10 minutes  Complications: None     EBL: Minimal  Plan: PACU for recovery and then overnight observation.

## 2018-06-29 NOTE — H&P (Signed)
Chief Complaint: Patient was seen in consultation today for microwave liver ablation for hepatocellular carcinoma  Referring Physician(s): Dr. Suzan Slick Kale/Dr. Jani Gravel   Supervising Physician: Markus Daft  Patient Status: [facility] - Out-pt - to be admitted for observation after procedure  History of Present Illness: Aaron [name] is a 72 y.o. male with a past medical history significant for DM II, HLD, HTN, anemia, hepatitis C and [place] diagnosed 2015 with previous history of DEB-TACE 03/13/2014 and thermal ablation 06/01/2014, 02/10/2016 and 07/21/2017 all of which were performed by Dr. Anselm Pancoast. He was seen in IR clinic on 05/12/18 for regular follow up to discuss his recent MRI without contrast results - there was concern that the most recently treated area in segment 8 was enlarging. An MRI with contrast was performed on 05/20/18 for further evaluation which showed evidence for an enhancing lesion adjacent to the segment 8 ablation site and concerning for another HCC. Based on this imaging patient agreed to undergo another microwave ablation procedure with Dr. Anselm Pancoast for which he presents today.  Patient reports he feels fine, denies any complaints aside from being hungry. He states that he is very familiar with the procedure and wishes to proceed.   Past Medical History:  Diagnosis Date  . Arthritis   . Diabetes mellitus without complication ([place])    type II   . Elevated liver enzymes   . Fatty liver   . Hepatitis C    genotype 1A status post treatment with Viekira and Ribavarin for 24 weeks  . Hepatocellular carcinoma ([name]) 01/30/2014   Path  . History of colon polyps   . Hyperlipidemia   . Hypertension   . Iron deficiency anemia    hx of   . MGUS (monoclonal gammopathy of unknown significance)     Past Surgical History:  Procedure Laterality Date  . COLONOSCOPY    . ESOPHAGOGASTRODUODENOSCOPY  2016  . IR GENERIC HISTORICAL  02/26/2016   IR RADIOLOGIST EVAL & MGMT 02/26/2016 Markus Daft, MD  [facility] INTERV RAD  . IR GENERIC HISTORICAL  01/16/2016   IR RADIOLOGIST EVAL & MGMT 01/16/2016 Markus Daft, MD [facility] INTERV RAD  . IR GENERIC HISTORICAL  06/24/2016   IR RADIOLOGIST EVAL & MGMT 06/24/2016 Aletta Edouard, MD [facility] INTERV RAD  . IR RADIOLOGIST EVAL & MGMT  12/17/2016  . IR RADIOLOGIST EVAL & MGMT  06/16/2017  . IR RADIOLOGIST EVAL & MGMT  09/08/2017  . IR RADIOLOGIST EVAL & MGMT  10/26/2017  . IR RADIOLOGIST EVAL & MGMT  05/12/2018  . POLYPECTOMY    . RADIOFREQUENCY ABLATION N/A 07/21/2017   Procedure: CT MICROWAVE THERMAL ABLATION-LIVER;  Surgeon: Markus Daft, MD;  Location: [facility] ORS;  Service: Anesthesiology;  Laterality: N/A;    Allergies: Patient has no known allergies.  Medications: Prior to Admission medications   Medication Sig Start Date End Date Taking? Authorizing Provider  ALPRAZolam Duanne Moron) 0.5 MG tablet Take 1 tablet (0.5 mg total) by mouth as directed. Take 2 tablets (1.0 mg total) prior to MR scan.  Make take an additional 0.5 mg po if needed. Patient not taking: Reported on 06/14/2018 10/26/17   Markus Daft, MD  aspirin EC 81 MG tablet Take 81 mg by mouth every morning.     [provider]  cholecalciferol (VITAMIN D) 1000 UNITS tablet Take 1,000 Units by mouth every morning.     [provider]  Insulin Glargine (BASAGLAR KWIKPEN) 100 UNIT/ML SOPN Inject 32 Units into the skin daily after breakfast.  [provider]  lisinopril-hydrochlorothiazide (PRINZIDE,ZESTORETIC) 20-12.5 MG tablet Take 1 tablet by mouth every morning.     [provider]  metFORMIN (GLUCOPHAGE) 1000 MG tablet Take 1,000 mg by mouth 2 (two) times daily with a meal.    [provider]  Multiple Vitamin (MULTIVITAMIN WITH MINERALS) TABS tablet Take 1 tablet daily by mouth.    [provider]  PAZEO 0.7 % SOLN Place 1 drop into both eyes every morning.  05/09/18   [provider]  simvastatin (ZOCOR) 40 MG tablet Take 20 mg by mouth every  evening.     [provider]     History reviewed. No pertinent family history.  Social History   Socioeconomic History  . Marital status: Married    Spouse name: Not on file  . Number of children: Not on file  . Years of education: Not on file  . Highest education level: Not on file  Occupational History  . Not on file  Social Needs  . Financial resource strain: Not on file  . Food insecurity:    Worry: Not on file    Inability: Not on file  . Transportation needs:    Medical: Not on file    Non-medical: Not on file  Tobacco Use  . Smoking status: Former Smoker    Last attempt to quit: 01/30/1994    Years since quitting: 24.4  . Smokeless tobacco: Never Used  . Tobacco comment: stopped 25 yrs ago  Substance and Sexual Activity  . Alcohol use: No    Comment: stopped 30 years ago   . Drug use: No  . Sexual activity: Not Currently  Lifestyle  . Physical activity:    Days per week: Not on file    Minutes per session: Not on file  . Stress: Not on file  Relationships  . Social connections:    Talks on phone: Not on file    Gets together: Not on file    Attends religious service: Not on file    Active member of club or organization: Not on file    Attends meetings of clubs or organizations: Not on file    Relationship status: Not on file  Other Topics Concern  . Not on file  Social History Narrative  . Not on file     Review of Systems: A 12 point ROS discussed and pertinent positives are indicated in the HPI above.  All other systems are negative.  Review of Systems  Constitutional: Negative for appetite change, chills and fever.  Respiratory: Negative for cough and shortness of breath.   Cardiovascular: Negative for chest pain.  Gastrointestinal: Negative for abdominal distention, abdominal pain, blood in stool, constipation, diarrhea, nausea and vomiting.  Skin: Negative for rash.  Neurological: Negative for dizziness and syncope.    Psychiatric/Behavioral: Negative for confusion.    Vital Signs: BP (!) 152/80   Pulse 74   Temp 97.7 F (36.5 C) (Oral)   Resp 18   SpO2 100%   Physical Exam  Constitutional: He is oriented to person, place, and time. No distress.  HENT:  Head: Normocephalic.  Cardiovascular: Normal rate, regular rhythm and normal heart sounds.  Pulmonary/Chest: Effort normal and breath sounds normal.  Abdominal: Soft. He exhibits no distension and no mass. There is no tenderness.  Neurological: He is alert and oriented to person, place, and time.  Skin: Skin is warm and dry. He is not diaphoretic.  Psychiatric: He has a normal mood and  affect. His behavior is normal. Judgment and thought content normal.  Vitals reviewed.    MD Evaluation Airway: WNL Heart: WNL Abdomen: WNL Chest/ Lungs: WNL ASA  Classification: 3 Mallampati/Airway Score: Two   Imaging: No results found.  Labs:  CBC: Recent Labs    09/23/17 0838 03/24/18 0917 06/21/18 0837 06/29/18 0733  WBC 6.5 5.8 5.2 5.3  HGB 11.6* 11.1* 11.7* 11.6*  HCT 38.0* 35.6* 39.7 39.8  PLT 285 242 287 269    COAGS: Recent Labs    07/14/17 0833 06/29/18 0733  INR 0.93 0.96  APTT  --  31    BMP: Recent Labs    09/23/17 0838 03/24/18 0917 05/20/18 0717 06/21/18 0837 06/29/18 0733  NA 139 140  --  138 139  K 4.8 4.8  --  4.5 4.1  CL 101 105  --  101 99  CO2 27 25  --  27 28  GLUCOSE 115 127*  --  149* 140*  BUN 16 30*  --  23 23  CALCIUM 9.8 9.8  --  9.5 10.0  CREATININE 1.53* 1.59* 1.40* 1.53* 1.48*  GFRNONAA 44* 42*  --  44* 46*  GFRAA 51* 49*  --  51* 53*    LIVER FUNCTION TESTS: Recent Labs    09/23/17 0838 03/24/18 0917 06/21/18 0837 06/29/18 0733  BILITOT 0.5 0.5 0.6 0.6  AST 19 15 24 24   ALT 13 12 18 20   ALKPHOS 46 44 36* 38  PROT 8.2 8.0 7.9 8.3*  ALBUMIN 4.2 4.2 4.2 4.6    TUMOR MARKERS: No results for input(s): AFPTM, CEA, CA199, CHROMGRNA in the last 8760 hours.  Assessment and  Plan:  Patient wit history of hepatitis C and [place] with previous DEB-TACE 03/13/2014 and thermal ablation 06/01/2014, 02/10/2016 and 07/21/2017 all by Dr. Anselm Pancoast. Most recent MRI showed evidence for an enhancing lesion adjacent to the segment 8 ablation site and concerning for another [place]. After discussion with Dr. Anselm Pancoast decision was made to proceed with another ablation procedure for which he presents today.  Patient is afebrile, NPO since last night, he does not take blood thinning medications. WBC 5.3, H/H 11.6/39.8, INR 0.96, creatinine 1.48 (which is near baseline for patient), AST 23, ALT 20, t.bili 0.6. He states understanding of procedure, including that he will be admitted for overnight observation, and wishes to proceed.  Risks and benefits discussed with the patient including, but not limited to bleeding, infection, liver failure, bile duct injury, pneumothorax or damage to adjacent structures.  All of the patient's questions were answered, patient is agreeable to proceed.  Consent signed and in chart.  Thank you for this interesting consult.  I greatly enjoyed meeting Jayshon Dommer and look forward to participating in their care.  A copy of this report was sent to the requesting provider on this date.  Electronically Signed: Joaquim Nam, PA-C 06/29/2018, 8:18 AM   I spent a total of  15 Minutes in face to face in clinical consultation, greater than 50% of which was counseling/coordinating care for microwave liver ablation.

## 2018-06-29 NOTE — Anesthesia Postprocedure Evaluation (Signed)
Anesthesia Post Note  Patient: Aaron [name]  Procedure(s) Performed: CT MICROWAVE THERMAL ABLATION (N/A )     Patient location during evaluation: PACU Anesthesia Type: General Level of consciousness: awake and alert Pain management: pain level controlled Vital Signs Assessment: post-procedure vital signs reviewed and stable Respiratory status: spontaneous breathing, nonlabored ventilation and respiratory function stable Cardiovascular status: blood pressure returned to baseline and stable Postop Assessment: no apparent nausea or vomiting Anesthetic complications: no    Last Vitals:  Vitals:   06/29/18 1157  BP: (!) 164/78  Pulse: 89  Resp: (!) 29  Temp: 36.7 C  SpO2: 100%    Last Pain:  Vitals:   06/29/18 1157  PainSc: 0-No pain                 Brennan Bailey

## 2018-06-29 NOTE — Transfer of Care (Signed)
Immediate Anesthesia Transfer of Care Note  Patient: Aaron [name]  Procedure(s) Performed: CT MICROWAVE THERMAL ABLATION (N/A )  Patient Location: PACU  Anesthesia Type:General  Level of Consciousness: awake, alert  and oriented  Airway & Oxygen Therapy: Patient Spontanous Breathing and Patient connected to face mask oxygen  Post-op Assessment: Report given to RN and Post -op Vital signs reviewed and stable  Post vital signs: Reviewed and stable  Last Vitals:  Vitals Value Taken Time  BP 164/78 06/29/2018 11:57 AM  Temp    Pulse 79 06/29/2018 12:00 PM  Resp 12 06/29/2018 12:00 PM  SpO2 100 % 06/29/2018 12:00 PM  Vitals shown include unvalidated device data.  Last Pain:  Vitals:   06/29/18 0739  PainSc: 0-No pain      Patients Stated Pain Goal: 5 (70/78/67 [id number])  Complications: No apparent anesthesia complications

## 2018-06-29 NOTE — Progress Notes (Addendum)
[facility] 313. Dr Lenox Ahr paged .  [place] Orders received from Dr Lenox Ahr. See Managed orders

## 2018-06-30 ENCOUNTER — Encounter (HOSPITAL_COMMUNITY): Payer: Self-pay | Admitting: Diagnostic Radiology

## 2018-06-30 DIAGNOSIS — C220 Liver cell carcinoma: Secondary | ICD-10-CM | POA: Diagnosis not present

## 2018-06-30 LAB — GLUCOSE, CAPILLARY: Glucose-Capillary: 111 mg/dL — ABNORMAL HIGH (ref 70–99)

## 2018-06-30 MED ORDER — VITAMIN D 25 MCG (1000 UNIT) PO TABS
1000.0000 [IU] | ORAL_TABLET | Freq: Every day | ORAL | Status: DC
  Administered 2018-06-30: 1000 [IU] via ORAL

## 2018-06-30 MED ORDER — OXYCODONE HCL 5 MG PO TABS: 5.0000 mg | ORAL_TABLET | Freq: Four times a day (QID) | ORAL | 0 refills | Status: DC | PRN

## 2018-06-30 NOTE — Discharge Summary (Signed)
Patient ID: Aaron [name] MRN: [id number] DOB/AGE: 71-23-1947 71 y.o.  Admit date: 06/29/2018 Discharge date: 06/30/2018  Supervising Physician: Markus Daft  Patient Status: [facility] - In-pt  Admission Diagnoses: Hepatocellular carcinoma  Discharge Diagnoses:  Active Problems:   Hepatocellular carcinoma [facility])   Discharged Condition: good  Hospital Course: 72 y/o M with PMH significant for DM II, HLD, HTN, anemia, hepatitis C and [place] diagnosed 2015 with previous history of DEB-TACE 03/13/2014 and thermal ablation 06/01/2014, 02/10/2016 and 07/21/2017 all of which were performed by Dr. Anselm Pancoast. He was seen in IR clinic on 05/12/18 for regular follow up to discuss his recent MRI without contrast results - there was concern that the most recently treated area in segment 8 was enlarging. An MRI with contrast was performed on 05/20/18 for further evaluation which showed evidence for an enhancing lesion adjacent to the segment 8 ablation site and concerning for another HCC. He presented to [facility] on 06/29/18 for scheduled microwave ablation of this liver lesion which was performed successfully without complication by Dr. Anselm Pancoast. The patient was admitted for overnight observation.  Patient did well post procedure - on the day of discharge he reported minimal pain, tolerating regular diet, void and ambulate without difficulty. He requested prescription for pain medication to be taken at home as he had had some pain after returning home after previous ablation. Discussed with Dr. Anselm Pancoast who agrees with discharge and short prescription for pain medication. Patient was given hard prescription for Oxycodone IR 5 mg tablet 1 tab PO Q6H PRN for moderate to severe pain #15, no refills. He will be scheduled for follow up in IR clinic with Dr. Anselm Pancoast in 1 month. He is to continue regular follow-up with all other providers. He was instructed to contact the IR clinic with any questions or concerns prior to his follow up appointment.  Patient voiced understanding of above.   Consults: None  Significant Diagnostic Studies: Ct Guide Tissue Ablation  Result Date: 06/29/2018 INDICATION: 72 year old with biopsy-proven hepatocellular carcinoma with local recurrences and development of new small lesions. Patient has undergone liver-directed therapy with chemoembolization and percutaneous microwave ablations. There is a new lesion adjacent to previously treated segment 8 lesion. This lesion is amendable to percutaneous ablation. EXAM: CT-GUIDED MICROWAVE ABLATION OF A HEPATIC LESION COMPARISON:  MRI 05/20/2018 MEDICATIONS: Zosyn 3.375 g; The antibiotic was administered in an appropriate time interval prior to needle puncture of the skin. ANESTHESIA/SEDATION: General - as administered by the Anesthesia department FLUOROSCOPY TIME:  None COMPLICATIONS: None immediate. TECHNIQUE: Informed written consent was obtained from the patient after a thorough discussion of the procedural risks, benefits and alternatives. A timeout was performed prior to the initiation of the procedure. Patient was placed under general anesthesia. Patient was placed supine on the CT scanner. Liver lesion was identified with ultrasound and CT images through the abdomen were obtained. The lesion was also visible with CT. Right upper abdomen was prepped and draped in sterile fashion. Maximal barrier sterile technique was utilized including caps, mask, sterile gowns, sterile gloves, sterile drape, hand hygiene and skin antiseptic. 21 gauge needle was directed into the liver lesion with ultrasound guidance and needle position was confirmed with CT. This needle was removed. A 17 gauge PR Neuwave probe was directed into the lesion using ultrasound and CT guidance. A second 17 gauge PR probe was directed into the lesion slightly more cephalad. Multiple CT images were obtained to confirm correct probe placement. Both probes were traversing the lesion and the tips  were through the  lesion. A suitable percutaneous window for biopsy was not found following placement of the microwave probes. Therefore, biopsy was not performed. The microwave ablation was performed for total of 10 minutes. CT imaging was performed approximately 5 minutes during the ablation. The needles were removed using the cautery function. Follow up CT images were obtained. Bandages placed at the puncture sites. FINDINGS: Slightly hypoechoic lesion in segment 8 near the previous ablation site. This lesion is relatively isoechoic but heterogeneous on ultrasound. Microwave probes were placed using a combination of ultrasound and CT guidance. Probe positioning within the lesion was confirmed with ultrasound. Treatment of the lesion was also confirmed with ultrasound during the ablation. Expected gas in the liver at the end of the procedure. No significant perihepatic bleeding or hematoma formation. IMPRESSION: Successful image guided microwave ablation of the new hepatic lesion. Electronically Signed   By: Markus Daft M.D.   On: 06/29/2018 13:19    Treatments: IV hydration and procedures: microwave liver lesion ablation 06/29/18 with Dr. Anselm Pancoast.  Discharge Exam: Blood pressure (!) 154/76, pulse 80, temperature 98.1 F (36.7 C), temperature source Oral, resp. rate 15, height 6' (1.829 m), weight 270 lb (122.5 kg), SpO2 96 %. Physical Exam  Constitutional: He is oriented to person, place, and time. No distress.  HENT:  Head: Normocephalic.  Right Ear: External ear normal.  Cardiovascular: Normal rate, regular rhythm and normal heart sounds.  Pulmonary/Chest: Effort normal and breath sounds normal.  Abdominal: Soft. Bowel sounds are normal. He exhibits no distension. There is no tenderness.  Insertion site clean, dry, dressed appropriately. No bleeding, discharge, bruising or pain on palpation.   Neurological: He is alert and oriented to person, place, and time.  Skin: Skin is warm and dry. He is not diaphoretic.    Psychiatric: He has a normal mood and affect. His behavior is normal. Judgment and thought content normal.  Nursing note and vitals reviewed.   Disposition: Discharge disposition: 01-Home or Self Care        Allergies as of 06/30/2018   No Known Allergies     Medication List    TAKE these medications   ALPRAZolam 0.5 MG tablet Commonly known as:  XANAX Take 1 tablet (0.5 mg total) by mouth as directed. Take 2 tablets (1.0 mg total) prior to MR scan.  Make take an additional 0.5 mg po if needed.   aspirin EC 81 MG tablet Take 81 mg by mouth every morning.   BASAGLAR KWIKPEN 100 UNIT/ML Sopn Inject 32 Units into the skin daily after breakfast.   cholecalciferol 1000 units tablet Commonly known as:  VITAMIN D Take 1,000 Units by mouth every morning.   lisinopril-hydrochlorothiazide 20-12.5 MG tablet Commonly known as:  PRINZIDE,ZESTORETIC Take 1 tablet by mouth every morning.   metFORMIN 1000 MG tablet Commonly known as:  GLUCOPHAGE Take 1,000 mg by mouth 2 (two) times daily with a meal.   multivitamin with minerals Tabs tablet Take 1 tablet daily by mouth.   oxyCODONE 5 MG immediate release tablet Commonly known as:  Oxy IR/ROXICODONE Take 1 tablet (5 mg total) by mouth every 6 (six) hours as needed for up to 15 doses for moderate pain or severe pain.   PAZEO 0.7 % Soln Generic drug:  Olopatadine HCl Place 1 drop into both eyes every morning.   simvastatin 40 MG tablet Commonly known as:  ZOCOR Take 20 mg by mouth every evening.      Follow-up Information  Markus Daft, MD Follow up in 1 month(s).   Specialties:  Interventional Radiology, Radiology Why:  IR scheduler will call with 1 month follow up appointment. Please call with any questions or concerns. Contact information: [address] [place] [address] [phone]            Electronically Signed: Joaquim Nam, PA-C 06/30/2018, 9:07 AM   I have spent Less Than 30  Minutes discharging [name].

## 2018-07-26 ENCOUNTER — Ambulatory Visit
Admission: RE | Admit: 2018-07-26 | Discharge: 2018-07-26 | Disposition: A | Discharge status: Home / Self Care | Payer: 59 | Source: Ambulatory Visit | Attending: Physician Assistant | Admitting: Physician Assistant

## 2018-07-26 DIAGNOSIS — C220 Liver cell carcinoma: Secondary | ICD-10-CM

## 2018-07-26 HISTORY — PX: IR RADIOLOGIST EVAL & MGMT: IMG5224

## 2018-07-26 NOTE — Progress Notes (Signed)
Chief Complaint: Patient was seen in consultation today for follow-up of CT-guided microwave ablation.  Referring Physician(s): [name]  History of Present Illness: Termaine [name] is a 72 y.o. male with history of hepatocellular carcinoma and hepatitis C.  I have performed multiple procedures on this patient for liver lesions.  Most recent procedure was a CT-guided microwave ablation of a segment 8 lesion on 06/29/2018.  The procedure appeared to be technically successful and the patient was kept overnight for observation after the procedure.  Post procedure care was unremarkable and the patient was discharged from the hospital on 06/30/2018.  Patient has no complaints since the ablation procedure.  He did not need any pain medication.  He denies fevers or chills.  No issues with bowel movements or constipation.  No GU issues.  No new neurologic or musculoskeletal issues.  Past Medical History:  Diagnosis Date  . Arthritis   . Diabetes mellitus without complication ([place])    type II   . Elevated liver enzymes   . Fatty liver   . Hepatitis C    genotype 1A status post treatment with Viekira and Ribavarin for 24 weeks  . Hepatocellular carcinoma ([place]) 01/30/2014   Path  . History of colon polyps   . Hyperlipidemia   . Hypertension   . Iron deficiency anemia    hx of   . MGUS (monoclonal gammopathy of unknown significance)     Past Surgical History:  Procedure Laterality Date  . COLONOSCOPY    . ESOPHAGOGASTRODUODENOSCOPY  2016  . IR GENERIC HISTORICAL  02/26/2016   IR RADIOLOGIST EVAL & MGMT 02/26/2016 Markus Daft, MD [facility] INTERV RAD  . IR GENERIC HISTORICAL  01/16/2016   IR RADIOLOGIST EVAL & MGMT 01/16/2016 Markus Daft, MD [facility] INTERV RAD  . IR GENERIC HISTORICAL  06/24/2016   IR RADIOLOGIST EVAL & MGMT 06/24/2016 Aletta Edouard, MD [facility] INTERV RAD  . IR RADIOLOGIST EVAL & MGMT  12/17/2016  . IR RADIOLOGIST EVAL & MGMT  06/16/2017  . IR RADIOLOGIST EVAL & MGMT  09/08/2017  .  IR RADIOLOGIST EVAL & MGMT  10/26/2017  . IR RADIOLOGIST EVAL & MGMT  05/12/2018  . IR RADIOLOGIST EVAL & MGMT  07/26/2018  . POLYPECTOMY    . RADIOFREQUENCY ABLATION N/A 07/21/2017   Procedure: CT MICROWAVE THERMAL ABLATION-LIVER;  Surgeon: Markus Daft, MD;  Location: [facility];  Service: Anesthesiology;  Laterality: N/A;  . RADIOFREQUENCY ABLATION N/A 06/29/2018   Procedure: CT MICROWAVE THERMAL ABLATION;  Surgeon: Markus Daft, MD;  Location: [facility] OR[facility];  Service: Anesthesiology;  Laterality: N/A;    Allergies: Patient has no known allergies.  Medications: Prior to Admission medications   Medication Sig Start Date End Date Taking? Authorizing Provider  ALPRAZolam Duanne Moron) 0.5 MG tablet Take 1 tablet (0.5 mg total) by mouth as directed. Take 2 tablets (1.0 mg total) prior to MR scan.  Make take an additional 0.5 mg po if needed. 10/26/17  Yes Markus Daft, MD  aspirin EC 81 MG tablet Take 81 mg by mouth every morning.    Yes [provider]  cholecalciferol (VITAMIN D) 1000 UNITS tablet Take 1,000 Units by mouth every morning.    Yes [provider]  Insulin Glargine (BASAGLAR KWIKPEN) 100 UNIT/ML SOPN Inject 32 Units into the skin daily after breakfast.   Yes [provider]  lisinopril-hydrochlorothiazide (PRINZIDE,ZESTORETIC) 20-12.5 MG tablet Take 1 tablet by mouth every morning.    Yes [provider]  metFORMIN (GLUCOPHAGE) 1000 MG  tablet Take 1,000 mg by mouth 2 (two) times daily with a meal.   Yes [provider]  Multiple Vitamin (MULTIVITAMIN WITH MINERALS) TABS tablet Take 1 tablet daily by mouth.   Yes [provider]  oxyCODONE (OXY IR/ROXICODONE) 5 MG immediate release tablet Take 1 tablet (5 mg total) by mouth every 6 (six) hours as needed for up to 15 doses for moderate pain or severe pain. 06/30/18  Yes Candiss Norse A, PA-C  PAZEO 0.7 % SOLN Place 1 drop into both eyes every morning.  05/09/18  Yes [provider]  simvastatin  (ZOCOR) 40 MG tablet Take 20 mg by mouth every evening.    Yes [provider]     No family history on file.  Social History   Socioeconomic History  . Marital status: Married    Spouse name: Not on file  . Number of children: Not on file  . Years of education: Not on file  . Highest education level: Not on file  Occupational History  . Not on file  Social Needs  . Financial resource strain: Not on file  . Food insecurity:    Worry: Not on file    Inability: Not on file  . Transportation needs:    Medical: Not on file    Non-medical: Not on file  Tobacco Use  . Smoking status: Former Smoker    Last attempt to quit: 01/30/1994    Years since quitting: 24.5  . Smokeless tobacco: Never Used  . Tobacco comment: stopped 25 yrs ago  Substance and Sexual Activity  . Alcohol use: No    Comment: stopped 30 years ago   . Drug use: No  . Sexual activity: Not Currently  Lifestyle  . Physical activity:    Days per week: Not on file    Minutes per session: Not on file  . Stress: Not on file  Relationships  . Social connections:    Talks on phone: Not on file    Gets together: Not on file    Attends religious service: Not on file    Active member of club or organization: Not on file    Attends meetings of clubs or organizations: Not on file    Relationship status: Not on file  Other Topics Concern  . Not on file  Social History Narrative  . Not on file      Review of Systems  Constitutional: Negative.   Respiratory: Negative.   Cardiovascular: Negative.   Gastrointestinal: Negative.   Genitourinary: Negative.     Vital Signs: BP (!) 169/76 (BP Location: Right Arm, Patient Position: Sitting, Cuff Size: Normal)   Pulse 88   Temp 98.2 F (36.8 C)   Resp 18   Wt 120.7 kg   SpO2 99%   BMI 36.08 kg/m   Physical Exam  Constitutional: No distress.  Cardiovascular: Normal rate, regular rhythm and normal heart sounds.  Pulmonary/Chest: Effort normal and  breath sounds normal.  Abdominal: Soft. He exhibits no distension. There is tenderness.  Small incisions in the right upper abdomen are well-healed.  Mild tenderness with direct palpation over the small incisions.  Vitals reviewed.      Imaging: Ct Guide Tissue Ablation  Result Date: 06/29/2018 INDICATION: 72 year old with biopsy-proven hepatocellular carcinoma with local recurrences and development of new small lesions. Patient has undergone liver-directed therapy with chemoembolization and percutaneous microwave ablations. There is a new lesion adjacent to previously treated segment 8 lesion. This lesion is amendable  to percutaneous ablation. EXAM: CT-GUIDED MICROWAVE ABLATION OF A HEPATIC LESION COMPARISON:  MRI 05/20/2018 MEDICATIONS: Zosyn 3.375 g; The antibiotic was administered in an appropriate time interval prior to needle puncture of the skin. ANESTHESIA/SEDATION: General - as administered by the Anesthesia department FLUOROSCOPY TIME:  None COMPLICATIONS: None immediate. TECHNIQUE: Informed written consent was obtained from the patient after a thorough discussion of the procedural risks, benefits and alternatives. A timeout was performed prior to the initiation of the procedure. Patient was placed under general anesthesia. Patient was placed supine on the CT scanner. Liver lesion was identified with ultrasound and CT images through the abdomen were obtained. The lesion was also visible with CT. Right upper abdomen was prepped and draped in sterile fashion. Maximal barrier sterile technique was utilized including caps, mask, sterile gowns, sterile gloves, sterile drape, hand hygiene and skin antiseptic. 21 gauge needle was directed into the liver lesion with ultrasound guidance and needle position was confirmed with CT. This needle was removed. A 17 gauge PR Neuwave probe was directed into the lesion using ultrasound and CT guidance. A second 17 gauge PR probe was directed into the lesion  slightly more cephalad. Multiple CT images were obtained to confirm correct probe placement. Both probes were traversing the lesion and the tips were through the lesion. A suitable percutaneous window for biopsy was not found following placement of the microwave probes. Therefore, biopsy was not performed. The microwave ablation was performed for total of 10 minutes. CT imaging was performed approximately 5 minutes during the ablation. The needles were removed using the cautery function. Follow up CT images were obtained. Bandages placed at the puncture sites. FINDINGS: Slightly hypoechoic lesion in segment 8 near the previous ablation site. This lesion is relatively isoechoic but heterogeneous on ultrasound. Microwave probes were placed using a combination of ultrasound and CT guidance. Probe positioning within the lesion was confirmed with ultrasound. Treatment of the lesion was also confirmed with ultrasound during the ablation. Expected gas in the liver at the end of the procedure. No significant perihepatic bleeding or hematoma formation. IMPRESSION: Successful image guided microwave ablation of the new hepatic lesion. Electronically Signed   By: Markus Daft M.D.   On: 06/29/2018 13:19   Ir Radiologist Eval & Mgmt  Result Date: 07/26/2018 Please refer to notes tab for details about interventional procedure. (Op Note)   Labs:  CBC: Recent Labs    09/23/17 0838 03/24/18 0917 06/21/18 0837 06/29/18 0733  WBC 6.5 5.8 5.2 5.3  HGB 11.6* 11.1* 11.7* 11.6*  HCT 38.0* 35.6* 39.7 39.8  PLT 285 242 287 269    COAGS: Recent Labs    06/29/18 0733  INR 0.96  APTT 31    BMP: Recent Labs    09/23/17 0838 03/24/18 0917 05/20/18 0717 06/21/18 0837 06/29/18 0733  NA 139 140  --  138 139  K 4.8 4.8  --  4.5 4.1  CL 101 105  --  101 99  CO2 27 25  --  27 28  GLUCOSE 115 127*  --  149* 140*  BUN 16 30*  --  23 23  CALCIUM 9.8 9.8  --  9.5 10.0  CREATININE 1.53* 1.59* 1.40* 1.53* 1.48*    GFRNONAA 44* 42*  --  44* 46*  GFRAA 51* 49*  --  51* 53*    LIVER FUNCTION TESTS: Recent Labs    09/23/17 0838 03/24/18 0917 06/21/18 0837 06/29/18 0733  BILITOT 0.5 0.5 0.6 0.6  AST 19  15 24 24   ALT 13 12 18 20   ALKPHOS 46 44 36* 38  PROT 8.2 8.0 7.9 8.3*  ALBUMIN 4.2 4.2 4.2 4.6    TUMOR MARKERS: No results for input(s): AFPTM, CEA, CA199, CHROMGRNA in the last 8760 hours.  Assessment and Plan:  72 year old with a history of hepatocellular carcinoma and recent treatment of a new lesion on 06/29/2018 with microwave ablation.  Patient tolerated the procedure well and no post procedure issues or complications.  Patient has been fortunate that his new lesions or recurrences have been solitary lesions which have been successfully managed with percutaneous ablation.  Patient understands that his disease requires close follow-up because he is at risk to develop new lesions or local recurrences.  Plan for a follow-up MRI of the liver, with and without contrast, in 2 months.  If that MRI is clear of disease, I would like to continue surveillance MRIs every 4 months.  Patient has concerns about the frequent MRIs because he has a hard time fitting in the MRI scanners due to his size.  He would prefer to get his scans done at [place] at [employer] [facility] because he had a good experience with the scanner in the past.  I told the patient that we will try to accommodate his MRI preference or find another location with an open magnet.  Plan for follow-up MRI in 2 months followed by clinic visit.  Electronically Signed: Burman Riis 07/26/2018, 8:34 AM   I spent a total of    10 Minutes in face to face in clinical consultation, greater than 50% of which was counseling/coordinating care for hepatocellular carcinoma. Patient ID: Aaron [name], male   DOB: 04/14/1946, 72 y.o.   MRN: [id number]

## 2018-08-29 DIAGNOSIS — I10 Essential (primary) hypertension: Secondary | ICD-10-CM | POA: Diagnosis not present

## 2018-08-29 DIAGNOSIS — E119 Type 2 diabetes mellitus without complications: Secondary | ICD-10-CM | POA: Diagnosis not present

## 2018-08-29 DIAGNOSIS — N183 Chronic kidney disease, stage 3 (moderate): Secondary | ICD-10-CM | POA: Diagnosis not present

## 2018-08-29 DIAGNOSIS — Z794 Long term (current) use of insulin: Secondary | ICD-10-CM | POA: Diagnosis not present

## 2018-09-23 ENCOUNTER — Ambulatory Visit: Payer: 59 | Admitting: Hematology

## 2018-09-23 ENCOUNTER — Other Ambulatory Visit: Payer: 59

## 2018-10-10 DIAGNOSIS — K766 Portal hypertension: Secondary | ICD-10-CM | POA: Diagnosis not present

## 2018-10-10 DIAGNOSIS — Z86010 Personal history of colonic polyps: Secondary | ICD-10-CM | POA: Diagnosis not present

## 2018-10-10 DIAGNOSIS — D500 Iron deficiency anemia secondary to blood loss (chronic): Secondary | ICD-10-CM | POA: Diagnosis not present

## 2018-10-25 ENCOUNTER — Other Ambulatory Visit: Payer: Self-pay

## 2018-10-25 ENCOUNTER — Other Ambulatory Visit: Payer: Self-pay | Admitting: Diagnostic Radiology

## 2018-10-25 DIAGNOSIS — C220 Liver cell carcinoma: Secondary | ICD-10-CM

## 2018-11-01 DIAGNOSIS — D122 Benign neoplasm of ascending colon: Secondary | ICD-10-CM | POA: Diagnosis not present

## 2018-11-01 DIAGNOSIS — D123 Benign neoplasm of transverse colon: Secondary | ICD-10-CM | POA: Diagnosis not present

## 2018-11-01 DIAGNOSIS — Z86010 Personal history of colonic polyps: Secondary | ICD-10-CM | POA: Diagnosis not present

## 2018-11-01 DIAGNOSIS — K635 Polyp of colon: Secondary | ICD-10-CM | POA: Diagnosis not present

## 2018-11-01 DIAGNOSIS — D500 Iron deficiency anemia secondary to blood loss (chronic): Secondary | ICD-10-CM | POA: Diagnosis not present

## 2018-11-01 DIAGNOSIS — D120 Benign neoplasm of cecum: Secondary | ICD-10-CM | POA: Diagnosis not present

## 2018-11-01 DIAGNOSIS — D509 Iron deficiency anemia, unspecified: Secondary | ICD-10-CM | POA: Diagnosis not present

## 2018-11-15 ENCOUNTER — Other Ambulatory Visit: Payer: Medicare Other

## 2018-11-15 ENCOUNTER — Other Ambulatory Visit (HOSPITAL_COMMUNITY): Payer: Medicare Other

## 2019-02-15 DIAGNOSIS — I10 Essential (primary) hypertension: Secondary | ICD-10-CM | POA: Diagnosis not present

## 2019-02-15 DIAGNOSIS — E7800 Pure hypercholesterolemia, unspecified: Secondary | ICD-10-CM | POA: Diagnosis not present

## 2019-02-15 DIAGNOSIS — Z125 Encounter for screening for malignant neoplasm of prostate: Secondary | ICD-10-CM | POA: Diagnosis not present

## 2019-02-15 DIAGNOSIS — R7309 Other abnormal glucose: Secondary | ICD-10-CM | POA: Diagnosis not present

## 2019-02-15 DIAGNOSIS — Z0000 Encounter for general adult medical examination without abnormal findings: Secondary | ICD-10-CM | POA: Diagnosis not present

## 2019-02-22 DIAGNOSIS — E7800 Pure hypercholesterolemia, unspecified: Secondary | ICD-10-CM | POA: Diagnosis not present

## 2019-02-22 DIAGNOSIS — Z125 Encounter for screening for malignant neoplasm of prostate: Secondary | ICD-10-CM | POA: Diagnosis not present

## 2019-02-22 DIAGNOSIS — R7309 Other abnormal glucose: Secondary | ICD-10-CM | POA: Diagnosis not present

## 2019-02-22 DIAGNOSIS — C229 Malignant neoplasm of liver, not specified as primary or secondary: Secondary | ICD-10-CM | POA: Diagnosis not present

## 2019-02-22 DIAGNOSIS — I10 Essential (primary) hypertension: Secondary | ICD-10-CM | POA: Diagnosis not present

## 2019-02-22 DIAGNOSIS — E119 Type 2 diabetes mellitus without complications: Secondary | ICD-10-CM | POA: Diagnosis not present

## 2019-02-22 DIAGNOSIS — Z0000 Encounter for general adult medical examination without abnormal findings: Secondary | ICD-10-CM | POA: Diagnosis not present

## 2019-03-02 DIAGNOSIS — E669 Obesity, unspecified: Secondary | ICD-10-CM | POA: Diagnosis not present

## 2019-03-02 DIAGNOSIS — R942 Abnormal results of pulmonary function studies: Secondary | ICD-10-CM | POA: Diagnosis not present

## 2019-03-02 DIAGNOSIS — R0602 Shortness of breath: Secondary | ICD-10-CM | POA: Diagnosis not present

## 2019-03-06 DIAGNOSIS — R0602 Shortness of breath: Secondary | ICD-10-CM | POA: Diagnosis not present

## 2019-03-09 ENCOUNTER — Other Ambulatory Visit: Payer: Self-pay | Admitting: Diagnostic Radiology

## 2019-03-10 DIAGNOSIS — E668 Other obesity: Secondary | ICD-10-CM | POA: Diagnosis not present

## 2019-03-10 DIAGNOSIS — R942 Abnormal results of pulmonary function studies: Secondary | ICD-10-CM | POA: Diagnosis not present

## 2019-03-10 DIAGNOSIS — I10 Essential (primary) hypertension: Secondary | ICD-10-CM | POA: Diagnosis not present

## 2019-03-10 DIAGNOSIS — F17210 Nicotine dependence, cigarettes, uncomplicated: Secondary | ICD-10-CM | POA: Diagnosis not present

## 2019-03-10 DIAGNOSIS — R0602 Shortness of breath: Secondary | ICD-10-CM | POA: Diagnosis not present

## 2019-03-14 ENCOUNTER — Other Ambulatory Visit: Payer: Self-pay

## 2019-03-14 ENCOUNTER — Ambulatory Visit (HOSPITAL_COMMUNITY)
Admission: RE | Admit: 2019-03-14 | Discharge: 2019-03-14 | Disposition: A | Discharge status: Home / Self Care | Payer: Medicare HMO | Source: Ambulatory Visit | Attending: Diagnostic Radiology | Admitting: Diagnostic Radiology

## 2019-03-14 DIAGNOSIS — C220 Liver cell carcinoma: Secondary | ICD-10-CM | POA: Insufficient documentation

## 2019-03-14 LAB — CREATININE, SERUM
Creatinine, Ser: 1.58 mg/dL — ABNORMAL HIGH (ref 0.61–1.24)
GFR calc Af Amer: 50 mL/min — ABNORMAL LOW (ref >60)
GFR calc non Af Amer: 43 mL/min — ABNORMAL LOW (ref >60)

## 2019-03-14 MED ORDER — GADOXETATE DISODIUM 0.25 MMOL/ML IV SOLN
10.0000 mL | Freq: Once | INTRAVENOUS | Status: AC | PRN
  Administered 2019-03-14: 10 mL via INTRAVENOUS

## 2019-03-22 ENCOUNTER — Ambulatory Visit
Admission: RE | Admit: 2019-03-22 | Discharge: 2019-03-22 | Disposition: A | Discharge status: Home / Self Care | Payer: Medicare Other | Source: Ambulatory Visit | Attending: Diagnostic Radiology | Admitting: Diagnostic Radiology

## 2019-03-22 ENCOUNTER — Other Ambulatory Visit (HOSPITAL_COMMUNITY): Payer: Self-pay | Admitting: Diagnostic Radiology

## 2019-03-22 ENCOUNTER — Other Ambulatory Visit: Payer: Self-pay

## 2019-03-22 ENCOUNTER — Encounter: Payer: Self-pay | Admitting: *Deleted

## 2019-03-22 DIAGNOSIS — C220 Liver cell carcinoma: Secondary | ICD-10-CM | POA: Diagnosis not present

## 2019-03-22 DIAGNOSIS — Z9289 Personal history of other medical treatment: Secondary | ICD-10-CM | POA: Diagnosis not present

## 2019-03-22 HISTORY — PX: IR RADIOLOGIST EVAL & MGMT: IMG5224

## 2019-03-22 NOTE — Progress Notes (Signed)
Chief Complaint: Patient was consulted remotely today (TeleHealth) for hepatocellular carcinoma follow-up   Referring Physician(s): [name]  History of Present Illness: Gilmar Bua is a 73 y.o. male with history of hepatocellular carcinoma and multiple liver directed therapies.  Patient was initially diagnosed with hepatocellular carcinoma in 2015.  He underwent chemoembolization and ablation to the primary tumor.  He subsequently had additional ablation to the primary liver lesion.  In 2018, the patient was found to have a new small lesion in segment 8.  This lesion was treated with image guided microwave ablation.  Follow-up imaging demonstrated recurrence in the segment 8 area and the patient had another microwave ablation of this area in November 2019.  Patient was scheduled to get a follow-up MRI earlier this year but the MRI was postponed related to the [place] pandemic.  As a result, the patient had a follow-up MRI on 03/14/2019.  Recent MRI demonstrates at least 2 suspicious areas for recurrent disease around the segment 8 ablation site.  I spoke to the patient on the telephone today for a telemedicine clinic visit.  His health status has not changed since our last encounter in 2019.  He has no complaints.  He denies appetite change, weight loss, abdominal pain, respiratory issues, bowel issues or urinary problems.  He remains active and continues to walk regularly.  He is doing routine work around the house and yard.  Past Medical History:  Diagnosis Date   Arthritis    Diabetes mellitus without complication ([place])    type II    Elevated liver enzymes    Fatty liver    Hepatitis C    genotype 1A status post treatment with Linzie Collin and Ribavarin for 24 weeks   Hepatocellular carcinoma ([place]) 01/30/2014   Path   History of colon polyps    Hyperlipidemia    Hypertension    Iron deficiency anemia    hx of    MGUS (monoclonal gammopathy of unknown significance)      Past Surgical History:  Procedure Laterality Date   COLONOSCOPY     ESOPHAGOGASTRODUODENOSCOPY  2016   IR GENERIC HISTORICAL  02/26/2016   IR RADIOLOGIST EVAL & MGMT 02/26/2016 Markus Daft, MD [facility] INTERV RAD   IR GENERIC HISTORICAL  01/16/2016   IR RADIOLOGIST EVAL & MGMT 01/16/2016 Markus Daft, MD [facility] RAD   IR GENERIC HISTORICAL  06/24/2016   IR RADIOLOGIST EVAL & MGMT 06/24/2016 Aletta Edouard, MD [facility]   IR RADIOLOGIST EVAL & MGMT  12/17/2016   IR RADIOLOGIST EVAL & MGMT  06/16/2017   IR RADIOLOGIST EVAL & MGMT  09/08/2017   IR RADIOLOGIST EVAL & MGMT  10/26/2017   IR RADIOLOGIST EVAL & MGMT  05/12/2018   IR RADIOLOGIST EVAL & MGMT  07/26/2018   IR RADIOLOGIST EVAL & MGMT  03/22/2019   POLYPECTOMY     RADIOFREQUENCY ABLATION N/A 07/21/2017   Procedure: CT MICROWAVE THERMAL ABLATION-LIVER;  Surgeon: Markus Daft, MD;  Location: [facility];  Service: Anesthesiology;  Laterality: N/A;   RADIOFREQUENCY ABLATION N/A 06/29/2018   Procedure: CT MICROWAVE THERMAL ABLATION;  Surgeon: Markus Daft, MD;  Location: [facility];  Service: Anesthesiology;  Laterality: N/A;    Allergies: Patient has no known allergies.  Medications: Prior to Admission medications   Medication Sig Start Date End Date Taking? Authorizing Provider  ALPRAZolam Duanne Moron) 0.5 MG tablet Take 1 tablet (0.5 mg total) by mouth as directed. Take 2 tablets (1.0 mg total) prior to MR  scan.  Make take an additional 0.5 mg po if needed. 10/26/17   Markus Daft, MD  aspirin EC 81 MG tablet Take 81 mg by mouth every morning.     [provider]  cholecalciferol (VITAMIN D) 1000 UNITS tablet Take 1,000 Units by mouth every morning.     [provider]  Insulin Glargine (BASAGLAR KWIKPEN) 100 UNIT/ML SOPN Inject 32 Units into the skin daily after breakfast.    [provider]  lisinopril-hydrochlorothiazide (PRINZIDE,ZESTORETIC) 20-12.5 MG tablet Take 1 tablet by mouth every morning.      [provider]  metFORMIN (GLUCOPHAGE) 1000 MG tablet Take 1,000 mg by mouth 2 (two) times daily with a meal.    [provider]  Multiple Vitamin (MULTIVITAMIN WITH MINERALS) TABS tablet Take 1 tablet daily by mouth.    [provider]  oxyCODONE (OXY IR/ROXICODONE) 5 MG immediate release tablet Take 1 tablet (5 mg total) by mouth every 6 (six) hours as needed for up to 15 doses for moderate pain or severe pain. 06/30/18   Candiss Norse A, PA-C  PAZEO 0.7 % SOLN Place 1 drop into both eyes every morning.  05/09/18   [provider]  simvastatin (ZOCOR) 40 MG tablet Take 20 mg by mouth every evening.     [provider]     No family history on file.  Social History   Socioeconomic History   Marital status: Married    Spouse name: Not on file   Number of children: Not on file   Years of education: Not on file   Highest education level: Not on file  Occupational History   Not on file  Social Needs   Financial resource strain: Not on file   Food insecurity    Worry: Not on file    Inability: Not on file   Transportation needs    Medical: Not on file    Non-medical: Not on file  Tobacco Use   Smoking status: Former Smoker    Quit date: 01/30/1994    Years since quitting: 25.1   Smokeless tobacco: Never Used   Tobacco comment: stopped 25 yrs ago  Substance and Sexual Activity   Alcohol use: No    Comment: stopped 30 years ago    Drug use: No   Sexual activity: Not Currently  Lifestyle   Physical activity    Days per week: Not on file    Minutes per session: Not on file   Stress: Not on file  Relationships   Social connections    Talks on phone: Not on file    Gets together: Not on file    Attends religious service: Not on file    Active member of club or organization: Not on file    Attends meetings of clubs or organizations: Not on file    Relationship status: Not on file  Other Topics Concern   Not on  file  Social History Narrative   Not on file      Review of Systems  Constitutional: Negative.  Negative for activity change and appetite change.  Respiratory: Negative.   Gastrointestinal: Negative.   Genitourinary: Negative.       Physical Exam No direct physical exam was performed (except for noted visual exam findings with Video Visits).    Vital Signs: There were no vitals taken for this visit.  Imaging: Mr Abdomen Wwo Contrast  Result Date: 03/14/2019 CLINICAL DATA:  Hepatocellular carcinoma status post radiofrequency ablation 06/29/2018.  EXAM: MRI ABDOMEN WITHOUT AND WITH CONTRAST TECHNIQUE: Multiplanar multisequence MR imaging of the abdomen was performed both before and after the administration of intravenous contrast. CONTRAST:  36mL EOVIST GADOXETATE DISODIUM 0.25 MOL/L IV SOLN COMPARISON:  Abdominal MRI 05/20/2017 and 05/12/2018. FINDINGS: Despite efforts by the technologist and patient, mild motion artifact is present on today's exam and could not be eliminated. This reduces exam sensitivity and specificity. Motion is greatest on the immediate postcontrast images. Lower chest: Mild right basilar atelectasis. The visualized lower chest otherwise appears unremarkable. Hepatobiliary: Mild hepatic steatosis is again noted. The ablation zone defect in segment 7 appears stable, measuring 3.1 x 3.1 cm on image 22/6. This demonstrates central T1 hyperintensity, but no enhancement following contrast. There are 2 adjacent ablation zone defects in segment 8 which are now associated with restricted diffusion, surrounding nodular arterial phase enhancement and delayed washout. The nodular enhancement associated with anterior lesion is predominately inferior to the lesion, measuring up to 3.3 x 2.4 cm on image 34/8. The more posterior lesion extends into segment 7 and has an enhancing nodular component measuring 2.6 x 1.7 cm on image 30/8. No other lesions are identified. No evidence of  gallstones, gallbladder wall thickening or biliary dilatation. Pancreas: Unremarkable. No pancreatic ductal dilatation or surrounding inflammatory changes. Spleen: Normal in size without focal abnormality. Adrenals/Urinary Tract: Both adrenal glands appear normal. There are stable small renal cysts bilaterally. No evidence of renal mass or hydronephrosis. Stomach/Bowel: No evidence of bowel wall thickening, distention or surrounding inflammatory change. Vascular/Lymphatic: There are no enlarged abdominal lymph nodes. No significant vascular findings. The portal vein appears normal without tumor. Other: No ascites or peritoneal nodularity. Musculoskeletal: No acute or significant osseous findings. Mild lumbar spondylosis. IMPRESSION: 1. New areas of restricted diffusion, surrounding nodular arterial phase enhancement and delayed washout surrounding the ablation zone defects anteriorly in segments 8 and 7 consistent with local recurrence of hepatocellular carcinoma. 2. The peripheral ablation zone defect laterally in segment 8 is unchanged. 3. No extrahepatic tumor identified. Electronically Signed   By: Richardean Sale M.D.   On: 03/14/2019 14:35   Ir Radiologist Eval & Mgmt  Result Date: 03/22/2019 Please refer to notes tab for details about interventional procedure. (Op Note)   Labs:  CBC: Recent Labs    03/24/18 0917 06/21/18 0837 06/29/18 0733  WBC 5.8 5.2 5.3  HGB 11.1* 11.7* 11.6*  HCT 35.6* 39.7 39.8  PLT 242 287 269    COAGS: Recent Labs    06/29/18 0733  INR 0.96  APTT 31    BMP: Recent Labs    03/24/18 0917 05/20/18 0717 06/21/18 0837 06/29/18 0733 03/14/19 1230  NA 140  --  138 139  --   K 4.8  --  4.5 4.1  --   CL 105  --  101 99  --   CO2 25  --  27 28  --   GLUCOSE 127*  --  149* 140*  --   BUN 30*  --  23 23  --   CALCIUM 9.8  --  9.5 10.0  --   CREATININE 1.59* 1.40* 1.53* 1.48* 1.58*  GFRNONAA 42*  --  44* 46* 43*  GFRAA 49*  --  51* 53* 50*    LIVER  FUNCTION TESTS: Recent Labs    03/24/18 0917 06/21/18 0837 06/29/18 0733  BILITOT 0.5 0.6 0.6  AST 15 24 24   ALT 12 18 20   ALKPHOS 44 36* 38  PROT 8.0 7.9 8.3*  ALBUMIN 4.2 4.2 4.6    TUMOR MARKERS: No results for input(s): AFPTM, CEA, CA199, CHROMGRNA in the last 8760 hours.  Assessment and Plan:  73 year old with history of biopsy-proven hepatocellular carcinoma and status post multiple liver directed therapies to the primary lesion and another area of disease in segment 8.  Recent liver MRI demonstrates at least 2 new areas of concern around the ablated segment 8 lesion.  Largest lesion measures up to 3.3 cm and the other lesion measures 2.6 cm.  I discussed the imaging findings with the patient on the telephone.  In addition, I discussed the patient's case at the GI tumor conference this morning.  At this point, I would like to get  additional input from oncology to see if there is additional treatment we can offer this patient other than local liver treatment.  Fortunately, the new liver lesions remain in close proximity to the previously treated segment 8 lesion.  I discussed performing another catheter directed embolization which the patient had on his primary lesion back in 2015.  Now that there is clearly more than one lesion, I think the patient may benefit from chemoembolization or bland embolization of these lesions.  These lesions may be amenable to ablation after embolization.  Patient wants to pursue additional treatments and is comfortable with the additional liver directed therapy.  He is also comfortable with discussing possible systemic options with oncology.  At this point, I anticipate performing a catheter directed embolization to the new liver lesions and will try to coordinate with oncology.     Electronically Signed: Burman Riis 03/22/2019, 12:07 PM   I spent a total of    10 Minutes in remote  clinical consultation, greater than 50% of which was  counseling/coordinating care for hepatocellular carcinoma..    Visit type: Audio only (telephone). Audio (no video) only due to Patient preference. Alternative for in-person consultation at [facility], [address], [place], [place]. This visit type was conducted due to national recommendations for restrictions regarding the COVID-19 Pandemic (e.g. social distancing).  This format is felt to be most appropriate for this patient at this time.  All issues noted in this document were discussed and addressed.  Patient ID: Zackariah Vanderpol, male   DOB: 1946-02-05, 73 y.o.   MRN: [id number]

## 2019-03-24 ENCOUNTER — Telehealth: Payer: Self-pay | Admitting: Hematology

## 2019-03-24 NOTE — Telephone Encounter (Signed)
Scheduled appt per 7/30 sch message - per GK okay to double book . Unable to reach pt . Left message with apt date and time

## 2019-03-28 NOTE — Progress Notes (Signed)
[name] Kitchen    HEMATOLOGY/ONCOLOGY CLINIC NOTE  Date of Service: 03/28/2019  Patient Care Team: Jani Gravel, MD as PCP - General (Internal Medicine)  CHIEF COMPLAINTS/PURPOSE OF CONSULTATION:  MGUS HCC  HISTORY OF PRESENTING ILLNESS:  Aaron [name] is a wonderful 73 y.o. male who has been referred to [place] by Dr Jani Gravel for evaluation and management of monoclonal gammopathy of undetermined significance.  Patient has history of hypertension, diabetes, dyslipidemia, hepatocellular carcinoma (rx with TACE and percutaneous thermal ablation), hepatitis C status post treatment, iron deficiency anemia in 2014 treated with oral iron.  Patient had an SPEP done by his primary care physician on 10/04/2015 that showed an M spike of 0.3 g/dL. It was presumably will be done due to his complaints of fatigue. He has had no significant anemia. No new bone pains. No fevers/chills/drenching night sweats.  No new anemia..  Outside labs show hemoglobin of 12.4 with microcytosis with an MCV of 70.5, normal WBC count of 5.1k.  No evidence of hypercalcemia or significant renal failure on his outside labs.   INTERVAL HISTORY  Aaron [name]  is here for his scheduled follow-up for MGUS and HCC. The patient's last visit with [place] was on 03/24/2018. The pt reports that he is doing well overall.  The pt reports no new symptoms.  The pt had a MRI abdomen w and wo contrast completed on 03/14/2019 with results revealing "1. New areas of restricted diffusion, surrounding nodular arterial phase enhancement and delayed washout surrounding the ablation zone defects anteriorly in segments 8 and 7 consistent with local recurrence of hepatocellular carcinoma. 2. The peripheral ablation zone defect laterally in segment 8 is unchanged. 3. No extrahepatic tumor identified."  On review of systems, pt reports no new symptoms and denies weight changes and any other symptoms.   MEDICAL HISTORY:  Past Medical History:  Diagnosis Date  .  Arthritis   . Diabetes mellitus without complication ([place])    type II   . Elevated liver enzymes   . Fatty liver   . Hepatitis C    genotype 1A status post treatment with Viekira and Ribavarin for 24 weeks  . Hepatocellular carcinoma ([place]) 01/30/2014   Path  . History of colon polyps   . Hyperlipidemia   . Hypertension   . Iron deficiency anemia    hx of   . MGUS (monoclonal gammopathy of unknown significance)    Obesity Hepatocellular carcinoma treated with TACE and percutaneous thermal ablation by interventional radiology. Last MRI on 08/06/2015 showed slight decrease in the size of the ablation defect involving the right lobe of the liver area and no findings to suggest residual or recurrent hepatocellular carcinoma. Mild changes of liver cirrhosis. MRI Abd 06/24/2016- no evidence of recurrent HCC   Hepatitis C genotype 1A status post treatment with Viekira and Ribavarin for 24 weeks.  Monoclonal gammopathy of undetermined significance.  SURGICAL HISTORY:  Status post microwave ablation[October 2015] of liver lesion and TACE [July 2015] for Nucla. EGD and colonoscopy in 2016   SOCIAL HISTORY: Social History   Socioeconomic History  . Marital status: Married    Spouse name: Not on file  . Number of children: Not on file  . Years of education: Not on file  . Highest education level: Not on file  Occupational History  . Not on file  Social Needs  . Financial resource strain: Not on file  . Food insecurity    Worry: Not on file    Inability: Not on file  .  Transportation needs    Medical: Not on file    Non-medical: Not on file  Tobacco Use  . Smoking status: Former Smoker    Quit date: 01/30/1994    Years since quitting: 25.1  . Smokeless tobacco: Never Used  . Tobacco comment: stopped 25 yrs ago  Substance and Sexual Activity  . Alcohol use: No    Comment: stopped 30 years ago   . Drug use: No  . Sexual activity: Not Currently  Lifestyle  . Physical activity     Days per week: Not on file    Minutes per session: Not on file  . Stress: Not on file  Relationships  . Social Herbalist on phone: Not on file    Gets together: Not on file    Attends religious service: Not on file    Active member of club or organization: Not on file    Attends meetings of clubs or organizations: Not on file    Relationship status: Not on file  . Intimate partner violence    Fear of current or ex partner: Not on file    Emotionally abused: Not on file    Physically abused: Not on file    Forced sexual activity: Not on file  Other Topics Concern  . Not on file  Social History Narrative  . Not on file  Former smoker and smoked 1 pack per day for about 20 years starting at age 58 quit 85 years ago.  FAMILY HISTORY: No family history on file.  ALLERGIES:  has No Known Allergies.  MEDICATIONS:  Current Outpatient Medications  Medication Sig Dispense Refill  . ALPRAZolam (XANAX) 0.5 MG tablet Take 1 tablet (0.5 mg total) by mouth as directed. Take 2 tablets (1.0 mg total) prior to MR scan.  Make take an additional 0.5 mg po if needed. 3 tablet 0  . aspirin EC 81 MG tablet Take 81 mg by mouth every morning.     . cholecalciferol (VITAMIN D) 1000 UNITS tablet Take 1,000 Units by mouth every morning.     . Insulin Glargine (BASAGLAR KWIKPEN) 100 UNIT/ML SOPN Inject 32 Units into the skin daily after breakfast.    . lisinopril-hydrochlorothiazide (PRINZIDE,ZESTORETIC) 20-12.5 MG tablet Take 1 tablet by mouth every morning.     . metFORMIN (GLUCOPHAGE) 1000 MG tablet Take 1,000 mg by mouth 2 (two) times daily with a meal.    . Multiple Vitamin (MULTIVITAMIN WITH MINERALS) TABS tablet Take 1 tablet daily by mouth.    . oxyCODONE (OXY IR/ROXICODONE) 5 MG immediate release tablet Take 1 tablet (5 mg total) by mouth every 6 (six) hours as needed for up to 15 doses for moderate pain or severe pain. 15 tablet 0  . PAZEO 0.7 % SOLN Place 1 drop into both eyes every  morning.   3  . simvastatin (ZOCOR) 40 MG tablet Take 20 mg by mouth every evening.      No current facility-administered medications for this visit.     REVIEW OF SYSTEMS:    A 10+ POINT REVIEW OF SYSTEMS WAS OBTAINED including neurology, dermatology, psychiatry, cardiac, respiratory, lymph, extremities, GI, GU, Musculoskeletal, constitutional, breasts, reproductive, HEENT.  All pertinent positives are noted in the HPI.  All others are negative.   PHYSICAL EXAMINATION: ECOG PERFORMANCE STATUS: 2 - Symptomatic, <50% confined to bed  . Vitals:   03/29/19 1315  BP: (!) 170/73  Pulse: 76  Resp: 18  Temp: 98.5 F (36.9 C)  SpO2: 99%   Filed Weights   03/29/19 1315  Weight: 272 lb 11.2 oz (123.7 kg)   .Body mass index is 36.98 kg/m.   LABORATORY DATA:  I have reviewed the data as listed  . CBC Latest Ref Rng & Units 03/30/2019 06/29/2018 06/21/2018  WBC 4.0 - 10.5 K/uL 6.1 5.3 5.2  Hemoglobin 13.0 - 17.0 g/dL 11.6(L) 11.6(L) 11.7(L)  Hematocrit 39.0 - 52.0 % 38.4(L) 39.8 39.7  Platelets 150 - 400 K/uL 285 269 287   . CBC    Component Value Date/Time   WBC 6.1 03/30/2019 0740   RBC 5.30 03/30/2019 0740   HGB 11.6 (L) 03/30/2019 0740   HGB 11.6 (L) 09/23/2017 0838   HGB 11.5 (L) 03/22/2017 1402   HCT 38.4 (L) 03/30/2019 0740   HCT 37.0 (L) 03/22/2017 1402   PLT 285 03/30/2019 0740   PLT 285 09/23/2017 0838   PLT 262 03/22/2017 1402   MCV 72.5 (L) 03/30/2019 0740   MCV 72.3 (L) 03/22/2017 1402   MCH 21.9 (L) 03/30/2019 0740   MCHC 30.2 03/30/2019 0740   RDW 15.4 03/30/2019 0740   RDW 15.6 (H) 03/22/2017 1402   LYMPHSABS 1.9 03/30/2019 0740   LYMPHSABS 1.8 03/22/2017 1402   MONOABS 0.5 03/30/2019 0740   MONOABS 0.4 03/22/2017 1402   EOSABS 0.2 03/30/2019 0740   EOSABS 0.1 03/22/2017 1402   BASOSABS 0.0 03/30/2019 0740   BASOSABS 0.0 03/22/2017 1402    . CMP Latest Ref Rng & Units 03/30/2019 03/14/2019 06/29/2018  Glucose 70 - 99 mg/dL 166(H) - 140(H)  BUN 8 -  23 mg/dL 21 - 23  Creatinine 0.61 - 1.24 mg/dL 1.56(H) 1.58(H) 1.48(H)  Sodium 135 - 145 mmol/L 138 - 139  Potassium 3.5 - 5.1 mmol/L 4.8 - 4.1  Chloride 98 - 111 mmol/L 102 - 99  CO2 22 - 32 mmol/L 22 - 28  Calcium 8.9 - 10.3 mg/dL 9.8 - 10.0  Total Protein 6.5 - 8.1 g/dL 7.7 - 8.3(H)  Total Bilirubin 0.3 - 1.2 mg/dL 0.4 - 0.6  Alkaline Phos 38 - 126 U/L 46 - 38  AST 15 - 41 U/L 21 - 24  ALT 0 - 44 U/L 19 - 20      IFE 1  Comment   Comments: Immunofixation shows IgG monoclonal protein with kappa light chain  specificity.           RADIOGRAPHIC STUDIES:  MRI abd w and wo contrast: 12/17/2016: IMPRESSION: 1. Postprocedural changes of thermal ablation again noted in the right lobe of the liver, without definitive evidence to suggest local recurrence of disease. No new hepatic lesions are noted. 2. Additional incidental findings, as above.   Electronically Signed   By: Vinnie Langton M.D.   On: 12/17/2016 09:42  ASSESSMENT & PLAN:   73 y.o. male with  #1 Monoclonal gammopathy of undetermined significance.  M protein 0.2 mg/dL immunofixation showing IgG monoclonal protein with kappa light chain specificity. SPEP 02/2016 - 0.3g/dl  No overt anemia.  No overt hypercalcemia. Stable CKD creatinine 1.6  #2 bone lucencies in the right radial mid midshaft and left humeral head. PET/CT did not show any hypermetabolic bone lesions. MRI left shoulder showed no evidence of metastatic disease or multiple myeloma. The x-ray findings appear to be areas of mild demineralization. Patient was seen by orthopedics and no additional recommendations were given.  Plan -Patient's anemia is stable. No significant change in renal function. No new bone pains. No hypercalcemia .  No overall no overt evidence of progression of multiple myeloma. -Myeloma labs from today show stable M protein @ 0.2g/dl with no evidence of progression- consistent with MGUS.  #3 Hepatocellular carcinoma treated  with TACE and percutaneous thermal ablation by interventional radiology.   Last MRI on 08/06/2015 showed slight decrease in the size of the ablation defect involving the right lobe of the liver area and no findings to suggest residual or recurrent hepatocellular carcinoma. Mild changes of liver cirrhosis.  01/16/2016 MRI Abdomen showed resolution of previously ablated lesion but a new 1.3 cm lesion was noted which was subsequently ablated under CT guidance by interventional radiology on 01/31/2016. Elevated transaminases on 02/01/2016 were likely related to his ablation. These have since resolved.   06/24/2016 MRI Abdomen showed no evidence of recurrent hepatocellular carcinoma . 12/17/2016 MRI Abdomen shows no evidence of recurrent hepatocellular carcinoma .  10/26/17 MRI Abdomen revealed Motion degraded images. Status post thermal ablation of the segment 8 lesion, without enhancement to suggest residual viable tumor. Prior ablation of a segment 7 lesion, grossly unchanged. No convincing central enhancement  03/14/2019  MRI abdomen w and wo contrast revealed "1. New areas of restricted diffusion, surrounding nodular arterial phase enhancement and delayed washout surrounding the ablation zone defects anteriorly in segments 8 and 7 consistent with local recurrence of hepatocellular carcinoma. 2. The peripheral ablation zone defect laterally in segment 8 is unchanged. 3. No extrahepatic tumor identified."  #4 Hepatitis C genotype 1A status post treatment with Viekira and Ribavarin for 24 weeks.  Plan -continue f/u with IR-Dr. Markus Daft for ongoing management of [place].   - AFP tumor marker from today wnl at 1.9. -Continue follow-up and hepatitis C clinic for continued monitoring and management of his hepatitis C.  #5 Microcytosis with Minimal Anemia - Hgb electrophoresis suggestive of Beta thal trait given relative polycythemia. Some element of iron defi Iron sat 18%, ferritin 88 but could be over estimate in  the setting of some liver injury Ferritin level stable at 77 today. PLAN:  -Discussed 03/14/2019 MRI abdomen w and wo contrast which revealed new 2-3.5 nodules consistent with local recurrence of hepatocellular carcinoma -Discussed that the current plan of treatment is biopsy and bland or chemotherapy embolization, then ablation if necessary -Will schedule a chest CT - does not have to wait for liver directed intervention pending CT chest -Lab work today -Will see pt back in 4 months  -CT chest wo contrast ASAP this week -labs today -RTC with Dr Irene Limbo with labs in 4 months   All of the patients questions were answered with apparent satisfaction. The patient knows to call the clinic with any problems, questions or concerns.  The total time spent in the appt was 25 minutes and more than 50% was on counseling and direct patient cares.  [name] MD [place] AAHIVMS [facility] Hematology/Oncology Physician [facility]  (Office):       [phone] (Work cell):  [phone] (Fax):           [phone]  I, De Burrs, am acting as a scribe for Dr. Irene Limbo  .I have reviewed the above documentation for accuracy and completeness, and I agree with the above. Brunetta Genera MD

## 2019-03-29 ENCOUNTER — Telehealth: Payer: Self-pay | Admitting: Hematology

## 2019-03-29 ENCOUNTER — Inpatient Hospital Stay: Payer: Medicare HMO | Attending: Hematology | Admitting: Hematology

## 2019-03-29 ENCOUNTER — Other Ambulatory Visit: Payer: Self-pay

## 2019-03-29 VITALS — BP 170/73 | HR 76 | Temp 98.5°F | Resp 18 | Ht 72.0 in | Wt 272.7 lb

## 2019-03-29 DIAGNOSIS — N189 Chronic kidney disease, unspecified: Secondary | ICD-10-CM | POA: Diagnosis not present

## 2019-03-29 DIAGNOSIS — C787 Secondary malignant neoplasm of liver and intrahepatic bile duct: Secondary | ICD-10-CM

## 2019-03-29 DIAGNOSIS — D649 Anemia, unspecified: Secondary | ICD-10-CM | POA: Diagnosis not present

## 2019-03-29 DIAGNOSIS — D472 Monoclonal gammopathy: Secondary | ICD-10-CM

## 2019-03-29 DIAGNOSIS — C220 Liver cell carcinoma: Secondary | ICD-10-CM

## 2019-03-29 DIAGNOSIS — K7460 Unspecified cirrhosis of liver: Secondary | ICD-10-CM | POA: Diagnosis not present

## 2019-03-29 DIAGNOSIS — Z8505 Personal history of malignant neoplasm of liver: Secondary | ICD-10-CM | POA: Insufficient documentation

## 2019-03-29 NOTE — Telephone Encounter (Signed)
Scheduled appt per 8/5 los.  Patient did not stop by scheduling today to get his lab per the los.  Called patient and patient will get his lab 8/6.  Patient stated he will call central radiology about his scan appt.  Patient aware of his appt dates and time.

## 2019-03-30 ENCOUNTER — Other Ambulatory Visit: Payer: Self-pay | Admitting: Student

## 2019-03-30 ENCOUNTER — Inpatient Hospital Stay: Payer: Medicare HMO

## 2019-03-30 ENCOUNTER — Other Ambulatory Visit: Payer: Self-pay | Admitting: Diagnostic Radiology

## 2019-03-30 ENCOUNTER — Other Ambulatory Visit: Payer: Self-pay

## 2019-03-30 DIAGNOSIS — N189 Chronic kidney disease, unspecified: Secondary | ICD-10-CM | POA: Diagnosis not present

## 2019-03-30 DIAGNOSIS — C220 Liver cell carcinoma: Secondary | ICD-10-CM

## 2019-03-30 DIAGNOSIS — D649 Anemia, unspecified: Secondary | ICD-10-CM | POA: Diagnosis not present

## 2019-03-30 DIAGNOSIS — K7460 Unspecified cirrhosis of liver: Secondary | ICD-10-CM | POA: Diagnosis not present

## 2019-03-30 DIAGNOSIS — D472 Monoclonal gammopathy: Secondary | ICD-10-CM

## 2019-03-30 DIAGNOSIS — Z8505 Personal history of malignant neoplasm of liver: Secondary | ICD-10-CM | POA: Diagnosis not present

## 2019-03-30 LAB — CMP (CANCER CENTER ONLY)
ALT: 19 U/L (ref 0–44)
AST: 21 U/L (ref 15–41)
Albumin: 4.1 g/dL (ref 3.5–5.0)
Alkaline Phosphatase: 46 U/L (ref 38–126)
Anion gap: 14 (ref 5–15)
BUN: 21 mg/dL (ref 8–23)
CO2: 22 mmol/L (ref 22–32)
Calcium: 9.8 mg/dL (ref 8.9–10.3)
Chloride: 102 mmol/L (ref 98–111)
Creatinine: 1.56 mg/dL — ABNORMAL HIGH (ref 0.61–1.24)
GFR, Est AFR Am: 51 mL/min — ABNORMAL LOW (ref >60)
GFR, Estimated: 44 mL/min — ABNORMAL LOW (ref >60)
Glucose, Bld: 166 mg/dL — ABNORMAL HIGH (ref 70–99)
Potassium: 4.8 mmol/L (ref 3.5–5.1)
Sodium: 138 mmol/L (ref 135–145)
Total Bilirubin: 0.4 mg/dL (ref 0.3–1.2)
Total Protein: 7.7 g/dL (ref 6.5–8.1)

## 2019-03-30 LAB — CBC WITH DIFFERENTIAL/PLATELET
Abs Immature Granulocytes: 0.02 10*3/uL (ref 0.00–0.07)
Basophils Absolute: 0 10*3/uL (ref 0.0–0.1)
Basophils Relative: 1 %
Eosinophils Absolute: 0.2 10*3/uL (ref 0.0–0.5)
Eosinophils Relative: 3 %
HCT: 38.4 % — ABNORMAL LOW (ref 39.0–52.0)
Hemoglobin: 11.6 g/dL — ABNORMAL LOW (ref 13.0–17.0)
Immature Granulocytes: 0 %
Lymphocytes Relative: 32 %
Lymphs Abs: 1.9 10*3/uL (ref 0.7–4.0)
MCH: 21.9 pg — ABNORMAL LOW (ref 26.0–34.0)
MCHC: 30.2 g/dL (ref 30.0–36.0)
MCV: 72.5 fL — ABNORMAL LOW (ref 80.0–100.0)
Monocytes Absolute: 0.5 10*3/uL (ref 0.1–1.0)
Monocytes Relative: 9 %
Neutro Abs: 3.4 10*3/uL (ref 1.7–7.7)
Neutrophils Relative %: 55 %
Platelets: 285 10*3/uL (ref 150–400)
RBC: 5.3 MIL/uL (ref 4.22–5.81)
RDW: 15.4 % (ref 11.5–15.5)
WBC: 6.1 10*3/uL (ref 4.0–10.5)
nRBC: 0 % (ref 0.0–0.2)

## 2019-03-30 LAB — PROTIME-INR
INR: 0.9 (ref 0.8–1.2)
Prothrombin Time: 11.9 seconds (ref 11.4–15.2)

## 2019-03-30 MED ORDER — LC BEADS 100-300UM IN SALINE: 75.0000 mg | Freq: Once | Status: DC

## 2019-03-30 NOTE — Addendum Note (Signed)
Addended by: Ronney Lion on: 03/30/2019 11:03 AM   Modules accepted: Orders

## 2019-03-31 LAB — AFP TUMOR MARKER: AFP, Serum, Tumor Marker: 1.9 ng/mL (ref 0.0–8.3)

## 2019-04-02 LAB — MULTIPLE MYELOMA PANEL, SERUM
Albumin SerPl Elph-Mcnc: 4 g/dL (ref 2.9–4.4)
Albumin/Glob SerPl: 1.2 (ref 0.7–1.7)
Alpha 1: 0.2 g/dL (ref 0.0–0.4)
Alpha2 Glob SerPl Elph-Mcnc: 1 g/dL (ref 0.4–1.0)
B-Globulin SerPl Elph-Mcnc: 1 g/dL (ref 0.7–1.3)
Gamma Glob SerPl Elph-Mcnc: 1.2 g/dL (ref 0.4–1.8)
Globulin, Total: 3.4 g/dL (ref 2.2–3.9)
IgA: 207 mg/dL (ref 61–437)
IgG (Immunoglobin G), Serum: 1337 mg/dL (ref 603–1613)
IgM (Immunoglobulin M), Srm: 69 mg/dL (ref 15–143)
M Protein SerPl Elph-Mcnc: 0.2 g/dL — ABNORMAL HIGH
Total Protein ELP: 7.4 g/dL (ref 6.0–8.5)

## 2019-04-03 ENCOUNTER — Other Ambulatory Visit: Payer: Self-pay | Admitting: Radiology

## 2019-04-04 ENCOUNTER — Other Ambulatory Visit: Payer: Self-pay | Admitting: Physician Assistant

## 2019-04-04 ENCOUNTER — Observation Stay (HOSPITAL_COMMUNITY)
Admission: AD | Admit: 2019-04-04 | Discharge: 2019-04-05 | Disposition: A | Discharge status: Home / Self Care | Payer: Medicare HMO | Source: Ambulatory Visit | Attending: Diagnostic Radiology | Admitting: Diagnostic Radiology

## 2019-04-04 ENCOUNTER — Ambulatory Visit (HOSPITAL_COMMUNITY)
Admission: RE | Admit: 2019-04-04 | Discharge: 2019-04-04 | Disposition: A | Discharge status: Home / Self Care | Payer: Medicare HMO | Source: Ambulatory Visit | Attending: Diagnostic Radiology | Admitting: Diagnostic Radiology

## 2019-04-04 ENCOUNTER — Other Ambulatory Visit: Payer: Self-pay

## 2019-04-04 DIAGNOSIS — E119 Type 2 diabetes mellitus without complications: Secondary | ICD-10-CM | POA: Diagnosis not present

## 2019-04-04 DIAGNOSIS — Z7982 Long term (current) use of aspirin: Secondary | ICD-10-CM | POA: Diagnosis not present

## 2019-04-04 DIAGNOSIS — Z794 Long term (current) use of insulin: Secondary | ICD-10-CM | POA: Diagnosis not present

## 2019-04-04 DIAGNOSIS — C220 Liver cell carcinoma: Secondary | ICD-10-CM | POA: Diagnosis not present

## 2019-04-04 DIAGNOSIS — I10 Essential (primary) hypertension: Secondary | ICD-10-CM | POA: Diagnosis not present

## 2019-04-04 DIAGNOSIS — Z1159 Encounter for screening for other viral diseases: Secondary | ICD-10-CM | POA: Diagnosis not present

## 2019-04-04 DIAGNOSIS — Z79899 Other long term (current) drug therapy: Secondary | ICD-10-CM | POA: Insufficient documentation

## 2019-04-04 DIAGNOSIS — M1990 Unspecified osteoarthritis, unspecified site: Secondary | ICD-10-CM | POA: Insufficient documentation

## 2019-04-04 DIAGNOSIS — E785 Hyperlipidemia, unspecified: Secondary | ICD-10-CM | POA: Insufficient documentation

## 2019-04-04 HISTORY — PX: IR ANGIOGRAM VISCERAL SELECTIVE: IMG657

## 2019-04-04 HISTORY — PX: IR EMBO TUMOR ORGAN ISCHEMIA INFARCT INC GUIDE ROADMAPPING: IMG5449

## 2019-04-04 HISTORY — PX: IR ANGIOGRAM SELECTIVE EACH ADDITIONAL VESSEL: IMG667

## 2019-04-04 HISTORY — PX: IR US GUIDE VASC ACCESS RIGHT: IMG2390

## 2019-04-04 LAB — COMPREHENSIVE METABOLIC PANEL
ALT: 21 U/L (ref 0–44)
AST: 21 U/L (ref 15–41)
Albumin: 4.6 g/dL (ref 3.5–5.0)
Alkaline Phosphatase: 47 U/L (ref 38–126)
Anion gap: 12 (ref 5–15)
BUN: 20 mg/dL (ref 8–23)
CO2: 27 mmol/L (ref 22–32)
Calcium: 10 mg/dL (ref 8.9–10.3)
Chloride: 100 mmol/L (ref 98–111)
Creatinine, Ser: 1.4 mg/dL — ABNORMAL HIGH (ref 0.61–1.24)
GFR calc Af Amer: 58 mL/min — ABNORMAL LOW (ref >60)
GFR calc non Af Amer: 50 mL/min — ABNORMAL LOW (ref >60)
Glucose, Bld: 150 mg/dL — ABNORMAL HIGH (ref 70–99)
Potassium: 4.2 mmol/L (ref 3.5–5.1)
Sodium: 139 mmol/L (ref 135–145)
Total Bilirubin: 0.6 mg/dL (ref 0.3–1.2)
Total Protein: 8.8 g/dL — ABNORMAL HIGH (ref 6.5–8.1)

## 2019-04-04 LAB — CBC WITH DIFFERENTIAL/PLATELET
Abs Immature Granulocytes: 0.02 10*3/uL (ref 0.00–0.07)
Basophils Absolute: 0 10*3/uL (ref 0.0–0.1)
Basophils Relative: 1 %
Eosinophils Absolute: 0.1 10*3/uL (ref 0.0–0.5)
Eosinophils Relative: 2 %
HCT: 40.9 % (ref 39.0–52.0)
Hemoglobin: 12.1 g/dL — ABNORMAL LOW (ref 13.0–17.0)
Immature Granulocytes: 0 %
Lymphocytes Relative: 32 %
Lymphs Abs: 2.1 10*3/uL (ref 0.7–4.0)
MCH: 21.7 pg — ABNORMAL LOW (ref 26.0–34.0)
MCHC: 29.6 g/dL — ABNORMAL LOW (ref 30.0–36.0)
MCV: 73.4 fL — ABNORMAL LOW (ref 80.0–100.0)
Monocytes Absolute: 0.6 10*3/uL (ref 0.1–1.0)
Monocytes Relative: 8 %
Neutro Abs: 3.8 10*3/uL (ref 1.7–7.7)
Neutrophils Relative %: 57 %
Platelets: 281 10*3/uL (ref 150–400)
RBC: 5.57 MIL/uL (ref 4.22–5.81)
RDW: 15.6 % — ABNORMAL HIGH (ref 11.5–15.5)
WBC: 6.7 10*3/uL (ref 4.0–10.5)
nRBC: 0 % (ref 0.0–0.2)

## 2019-04-04 LAB — SARS CORONAVIRUS 2 BY RT PCR (HOSPITAL ORDER, PERFORMED IN ~~LOC~~ HOSPITAL LAB): SARS Coronavirus 2: NEGATIVE

## 2019-04-04 LAB — GLUCOSE, CAPILLARY
Glucose-Capillary: 149 mg/dL — ABNORMAL HIGH (ref 70–99)
Glucose-Capillary: 264 mg/dL — ABNORMAL HIGH (ref 70–99)

## 2019-04-04 LAB — PROTIME-INR
INR: 0.9 (ref 0.8–1.2)
Prothrombin Time: 11.8 seconds (ref 11.4–15.2)

## 2019-04-04 MED ORDER — PANTOPRAZOLE SODIUM 40 MG IV SOLR
40.0000 mg | Freq: Once | INTRAVENOUS | Status: AC
  Administered 2019-04-04: 40 mg via INTRAVENOUS
  Filled 2019-04-04: qty 40

## 2019-04-04 MED ORDER — IOHEXOL 300 MG/ML  SOLN
100.0000 mL | Freq: Once | INTRAMUSCULAR | Status: AC | PRN
  Administered 2019-04-04: 20 mL via INTRA_ARTERIAL

## 2019-04-04 MED ORDER — HYDROCHLOROTHIAZIDE 12.5 MG PO CAPS
12.5000 mg | ORAL_CAPSULE | Freq: Every day | ORAL | Status: DC
  Administered 2019-04-05: 12.5 mg via ORAL
  Filled 2019-04-04: qty 1

## 2019-04-04 MED ORDER — MIDAZOLAM HCL 2 MG/2ML IJ SOLN
INTRAMUSCULAR | Status: AC | PRN
  Administered 2019-04-04 (×6): 1 mg via INTRAVENOUS

## 2019-04-04 MED ORDER — INSULIN GLARGINE 100 UNIT/ML ~~LOC~~ SOLN
32.0000 [IU] | Freq: Every day | SUBCUTANEOUS | Status: DC
  Administered 2019-04-05: 32 [IU] via SUBCUTANEOUS
  Filled 2019-04-04: qty 0.32

## 2019-04-04 MED ORDER — FENTANYL CITRATE (PF) 100 MCG/2ML IJ SOLN
INTRAMUSCULAR | Status: AC | PRN
  Administered 2019-04-04 (×2): 50 ug via INTRAVENOUS

## 2019-04-04 MED ORDER — SIMVASTATIN 20 MG PO TABS
20.0000 mg | ORAL_TABLET | Freq: Every evening | ORAL | Status: DC
  Administered 2019-04-04: 20 mg via ORAL
  Filled 2019-04-04: qty 1

## 2019-04-04 MED ORDER — SODIUM CHLORIDE 0.9 % IV SOLN
8.0000 mg | Freq: Once | INTRAVENOUS | Status: AC
  Administered 2019-04-04: 8 mg via INTRAVENOUS
  Filled 2019-04-04: qty 4

## 2019-04-04 MED ORDER — LIDOCAINE HCL 1 % IJ SOLN
INTRAMUSCULAR | Status: AC
  Filled 2019-04-04: qty 20

## 2019-04-04 MED ORDER — NITROGLYCERIN 1 MG/10 ML FOR IR/CATH LAB
INTRA_ARTERIAL | Status: AC | PRN
  Administered 2019-04-04: 200 ug via INTRA_ARTERIAL

## 2019-04-04 MED ORDER — ALPRAZOLAM 0.5 MG PO TABS: 0.5000 mg | ORAL_TABLET | ORAL | Status: DC

## 2019-04-04 MED ORDER — OXYCODONE HCL 5 MG PO TABS: 5.0000 mg | ORAL_TABLET | Freq: Four times a day (QID) | ORAL | Status: DC | PRN

## 2019-04-04 MED ORDER — SODIUM CHLORIDE 0.9 % IV SOLN
INTRAVENOUS | Status: DC
  Administered 2019-04-04 – 2019-04-05 (×2): via INTRAVENOUS

## 2019-04-04 MED ORDER — FENTANYL CITRATE (PF) 100 MCG/2ML IJ SOLN
INTRAMUSCULAR | Status: AC
  Filled 2019-04-04: qty 4

## 2019-04-04 MED ORDER — SODIUM CHLORIDE 0.9 % IV SOLN
INTRAVENOUS | Status: DC
  Administered 2019-04-04: 13:00:00 via INTRAVENOUS

## 2019-04-04 MED ORDER — MIDAZOLAM HCL 2 MG/2ML IJ SOLN
INTRAMUSCULAR | Status: AC
  Administered 2019-04-04: 1 mg
  Filled 2019-04-04: qty 6

## 2019-04-04 MED ORDER — IOHEXOL 300 MG/ML  SOLN
100.0000 mL | Freq: Once | INTRAMUSCULAR | Status: AC | PRN
  Administered 2019-04-04: 50 mL via INTRA_ARTERIAL

## 2019-04-04 MED ORDER — IOHEXOL 300 MG/ML  SOLN
100.0000 mL | Freq: Once | INTRAMUSCULAR | Status: AC | PRN
  Administered 2019-04-04: 23 mL via INTRA_ARTERIAL

## 2019-04-04 MED ORDER — LISINOPRIL 20 MG PO TABS
20.0000 mg | ORAL_TABLET | Freq: Every day | ORAL | Status: DC
  Administered 2019-04-05: 20 mg via ORAL
  Filled 2019-04-04: qty 1

## 2019-04-04 MED ORDER — NITROGLYCERIN IN D5W 100-5 MCG/ML-% IV SOLN
INTRAVENOUS | Status: AC
  Filled 2019-04-04: qty 250

## 2019-04-04 MED ORDER — LC BEADS 100-300UM IN SALINE
75.0000 mg | Freq: Once | Status: AC
  Administered 2019-04-04: 76 mg via INTRA_ARTERIAL
  Filled 2019-04-04: qty 38

## 2019-04-04 MED ORDER — IOHEXOL 300 MG/ML  SOLN
100.0000 mL | Freq: Once | INTRAMUSCULAR | Status: AC | PRN
  Administered 2019-04-04: 100 mL via INTRA_ARTERIAL

## 2019-04-04 MED ORDER — DEXAMETHASONE SODIUM PHOSPHATE 10 MG/ML IJ SOLN
8.0000 mg | Freq: Once | INTRAMUSCULAR | Status: AC
  Administered 2019-04-04: 8 mg via INTRAVENOUS
  Filled 2019-04-04: qty 1

## 2019-04-04 MED ORDER — PIPERACILLIN-TAZOBACTAM 3.375 G IVPB
3.3750 g | Freq: Three times a day (TID) | INTRAVENOUS | Status: DC
  Administered 2019-04-04: 3.375 g via INTRAVENOUS
  Filled 2019-04-04: qty 50

## 2019-04-04 MED ORDER — LISINOPRIL-HYDROCHLOROTHIAZIDE 20-12.5 MG PO TABS: 1.0000 | ORAL_TABLET | ORAL | Status: DC

## 2019-04-04 MED ORDER — LIDOCAINE HCL (PF) 1 % IJ SOLN
INTRAMUSCULAR | Status: AC | PRN
  Administered 2019-04-04: 10 mL

## 2019-04-04 NOTE — H&P (Addendum)
Referring Physician(s): Kale,G  Supervising Physician: Aaron [name]  Patient Status:  [facility] OP TBA  Chief Complaint: Multifocal hepatocellular carcinoma   Subjective: Aaron [name] is a 73 year old male with history of hepatocellular carcinoma and multiple liver directed therapies.  He was initially diagnosed with hepatocellular carcinoma in 2015 and underwent chemoembolization and ablation to the primary tumor.  He subsequently had additional ablation to the primary liver lesion.  In 2018 he was found to have a new small lesion in segment 8 which was treated with image guided microwave ablation.  Follow-up imaging demonstrated recurrence in segment 8 area and he underwent another microwave ablation in November 2019.  Follow-up MRI on 03/14/2019 demonstrated at least 2 suspicious areas for recurrent disease around the segment 8 ablation site.  Following telephone discussions with Dr.Henn he was deemed an appropriate candidate for hepatic chemoembolization and presents today for the procedure.  He currently denies fever, headache, chest pain, dyspnea, cough, abdominal/back pain, nausea, vomiting or bleeding.  He also has MGUS.  Additional past medical history as listed below.  Past Medical History:  Diagnosis Date  . Arthritis   . Diabetes mellitus without complication ([place])    type II   . Elevated liver enzymes   . Fatty liver   . Hepatitis C    genotype 1A status post treatment with Viekira and Ribavarin for 24 weeks  . Hepatocellular carcinoma ([place]) 01/30/2014   Path  . History of colon polyps   . Hyperlipidemia   . Hypertension   . Iron deficiency anemia    hx of   . MGUS (monoclonal gammopathy of unknown significance)    Past Surgical History:  Procedure Laterality Date  . COLONOSCOPY    . ESOPHAGOGASTRODUODENOSCOPY  2016  . IR GENERIC HISTORICAL  02/26/2016   IR RADIOLOGIST EVAL & MGMT 02/26/2016 Aaron Daft, MD [facility] INTERV RAD  . IR GENERIC HISTORICAL  01/16/2016   IR RADIOLOGIST  EVAL & MGMT 01/16/2016 Aaron Daft, MD [facility] INTERV RAD  . IR GENERIC HISTORICAL  06/24/2016   IR RADIOLOGIST EVAL & MGMT 06/24/2016 Aletta Edouard, MD [facility] INTERV RAD  . IR RADIOLOGIST EVAL & MGMT  12/17/2016  . IR RADIOLOGIST EVAL & MGMT  06/16/2017  . IR RADIOLOGIST EVAL & MGMT  09/08/2017  . IR RADIOLOGIST EVAL & MGMT  10/26/2017  . IR RADIOLOGIST EVAL & MGMT  05/12/2018  . IR RADIOLOGIST EVAL & MGMT  07/26/2018  . IR RADIOLOGIST EVAL & MGMT  03/22/2019  . POLYPECTOMY    . RADIOFREQUENCY ABLATION N/A 07/21/2017   Procedure: CT MICROWAVE THERMAL ABLATION-LIVER;  Surgeon: Aaron Daft, MD;  Location: [facility];  Service: Anesthesiology;  Laterality: N/A;  . RADIOFREQUENCY ABLATION N/A 06/29/2018   Procedure: CT MICROWAVE THERMAL ABLATION;  Surgeon: Aaron Daft, MD;  Location: [facility];  Service: Anesthesiology;  Laterality: N/A;      Allergies: Patient has no known allergies.  Medications: Prior to Admission medications   Medication Sig Start Date End Date Taking? Authorizing Provider  aspirin EC 81 MG tablet Take 81 mg by mouth every morning.    Yes [provider]  cholecalciferol (VITAMIN D) 1000 UNITS tablet Take 1,000 Units by mouth every morning.    Yes [provider]  Insulin Glargine (BASAGLAR KWIKPEN) 100 UNIT/ML SOPN Inject 32 Units into the skin daily after breakfast.   Yes [provider]  lisinopril-hydrochlorothiazide (PRINZIDE,ZESTORETIC) 20-12.5 MG tablet Take 1 tablet by mouth every morning.    Yes [provider]  metFORMIN (GLUCOPHAGE) 1000 MG tablet Take 1,000 mg by mouth 2 (two) times daily with a meal.   Yes [provider]  Multiple Vitamin (MULTIVITAMIN WITH MINERALS) TABS tablet Take 1 tablet daily by mouth.   Yes [provider]  PAZEO 0.7 % SOLN Place 1 drop into both eyes every morning.  05/09/18  Yes [provider]  simvastatin (ZOCOR) 40 MG tablet Take 20 mg by mouth every evening.    Yes [provider]  ALPRAZolam (XANAX) 0.5 MG tablet Take 1 tablet (0.5 mg total) by mouth as directed. Take 2 tablets (1.0 mg total) prior to MR scan.  Make take an additional 0.5 mg po if needed. 10/26/17   Aaron Daft, MD  oxyCODONE (OXY IR/ROXICODONE) 5 MG immediate release tablet Take 1 tablet (5 mg total) by mouth every 6 (six) hours as needed for up to 15 doses for moderate pain or severe pain. 06/30/18   Candiss Norse A, PA-C     Vital Signs: BP (!) 157/105   Pulse 90   Temp 99 F (37.2 C)   Resp 18   SpO2 98%   Physical Exam awake, alert.  Chest clear to auscultation bilaterally.  Heart with regular rate and rhythm.  Abdomen protuberant, soft, positive bowel sounds, nontender.  Trace to 1+ pretibial edema bilaterally.  Imaging: No results found.  Labs:  CBC: Recent Labs    06/21/18 0837 06/29/18 0733 03/30/19 0740 04/04/19 1235  WBC 5.2 5.3 6.1 6.7  HGB 11.7* 11.6* 11.6* 12.1*  HCT 39.7 39.8 38.4* 40.9  PLT 287 269 285 281    COAGS: Recent Labs    06/29/18 0733 03/30/19 0741  INR 0.96 0.9  APTT 31  --     BMP: Recent Labs    06/21/18 0837 06/29/18 0733 03/14/19 1230 03/30/19 0740  NA 138 139  --  138  K 4.5 4.1  --  4.8  CL 101 99  --  102  CO2 27 28  --  22  GLUCOSE 149* 140*  --  166*  BUN 23 23  --  21  CALCIUM 9.5 10.0  --  9.8  CREATININE 1.53* 1.48* 1.58* 1.56*  GFRNONAA 44* 46* 43* 44*  GFRAA 51* 53* 50* 51*    LIVER FUNCTION TESTS: Recent Labs    06/21/18 0837 06/29/18 0733 03/30/19 0740  BILITOT 0.6 0.6 0.4  AST 24 24 21   ALT 18 20 19   ALKPHOS 36* 38 46  PROT 7.9 8.3* 7.7  ALBUMIN 4.2 4.6 4.1    Assessment and Plan: 73 year old with history of hep C, biopsy-proven hepatocellular carcinoma and status post multiple liver directed therapies to the primary lesion and another area of disease in segment 8.  Recent liver MRI demonstrates at least 2 new areas of concern around the ablated segment 8 lesion. Largest lesion measures up  to 3.3 cm and the other lesion measures 2.6 cm.  Following recent discussions with patient/oncology/Dr. Anselm Pancoast he was deemed an appropriate candidate for hepatic tumor chemoembolization and presents today for the procedure.Risks and benefits of hepatic chemoembolization were discussed with the patient including, but not limited to bleeding, infection, vascular injury or contrast induced renal failure.  This interventional procedure involves the use of X-rays and because of the nature of the planned procedure, it is possible that we will have prolonged use of X-ray fluoroscopy.  Potential radiation risks to you include (but are not limited to) the following: - A slightly elevated risk for  cancer  several years later in life. This risk is typically less than 0.5% percent. This risk is low in comparison to the normal incidence of human cancer, which is 33% for women and 50% for men according to the [place]. - Radiation induced injury can include skin redness, resembling a rash, tissue breakdown / ulcers and hair loss (which can be temporary or permanent).   The likelihood of either of these occurring depends on the difficulty of the procedure and whether you are sensitive to radiation due to previous procedures, disease, or genetic conditions.   IF your procedure requires a prolonged use of radiation, you will be notified and given written instructions for further action.  It is your responsibility to monitor the irradiated area for the 2 weeks following the procedure and to notify your physician if you are concerned that you have suffered a radiation induced injury.    All of the patient's questions were answered, patient is agreeable to proceed.  Consent signed and in chart.  Post procedure he will be admitted to the hospital for overnight observation.  LABS PENDING  Electronically Signed: D. Rowe Robert, PA-C 04/04/2019, 12:59 PM   I spent a total of 30 minutes at the the  patient's bedside AND on the patient's hospital floor or unit, greater than 50% of which was counseling/coordinating care for hepatic tumor chemoembolization

## 2019-04-04 NOTE — Procedures (Signed)
Interventional Radiology Procedure:   Indications: Recurrent hepatocellular carcinoma  Procedure: Hepatic arteriography with chemoembolization to hypervascular lesions  Findings: Two hypervascular lesions in central liver, one supplied from left hepatic artery and other supplied by right hepatic branch.  Lesion supplied by right hepatic artery was treated with DEB-TACE.  Lesion supplied by left hepatic artery initially seen but the main supplying branch repeated went into spasm and possible dissection.  Minimal DEB-TACE was administered to the left hepatic artery lesion.  No significant tumor blush at end of procedure.  Complications: None     EBL: less than 20 ml  Plan: Overnight observation for potential pain control.  Anticipate discharge tomorrow and follow up MRI in 4 weeks.     Mccall Will R. Anselm Pancoast, MD  Pager: [phone]

## 2019-04-05 ENCOUNTER — Encounter (HOSPITAL_COMMUNITY): Payer: Self-pay | Admitting: Diagnostic Radiology

## 2019-04-05 DIAGNOSIS — E119 Type 2 diabetes mellitus without complications: Secondary | ICD-10-CM | POA: Diagnosis not present

## 2019-04-05 DIAGNOSIS — Z7982 Long term (current) use of aspirin: Secondary | ICD-10-CM | POA: Diagnosis not present

## 2019-04-05 DIAGNOSIS — Z794 Long term (current) use of insulin: Secondary | ICD-10-CM | POA: Diagnosis not present

## 2019-04-05 DIAGNOSIS — E785 Hyperlipidemia, unspecified: Secondary | ICD-10-CM | POA: Diagnosis not present

## 2019-04-05 DIAGNOSIS — Z1159 Encounter for screening for other viral diseases: Secondary | ICD-10-CM | POA: Diagnosis not present

## 2019-04-05 DIAGNOSIS — M1990 Unspecified osteoarthritis, unspecified site: Secondary | ICD-10-CM | POA: Diagnosis not present

## 2019-04-05 DIAGNOSIS — I10 Essential (primary) hypertension: Secondary | ICD-10-CM | POA: Diagnosis not present

## 2019-04-05 DIAGNOSIS — C220 Liver cell carcinoma: Secondary | ICD-10-CM | POA: Diagnosis not present

## 2019-04-05 DIAGNOSIS — Z79899 Other long term (current) drug therapy: Secondary | ICD-10-CM | POA: Diagnosis not present

## 2019-04-05 LAB — COMPREHENSIVE METABOLIC PANEL
ALT: 19 U/L (ref 0–44)
AST: 22 U/L (ref 15–41)
Albumin: 3.4 g/dL — ABNORMAL LOW (ref 3.5–5.0)
Alkaline Phosphatase: 38 U/L (ref 38–126)
Anion gap: 9 (ref 5–15)
BUN: 25 mg/dL — ABNORMAL HIGH (ref 8–23)
CO2: 23 mmol/L (ref 22–32)
Calcium: 8.7 mg/dL — ABNORMAL LOW (ref 8.9–10.3)
Chloride: 104 mmol/L (ref 98–111)
Creatinine, Ser: 1.48 mg/dL — ABNORMAL HIGH (ref 0.61–1.24)
GFR calc Af Amer: 54 mL/min — ABNORMAL LOW (ref >60)
GFR calc non Af Amer: 47 mL/min — ABNORMAL LOW (ref >60)
Glucose, Bld: 213 mg/dL — ABNORMAL HIGH (ref 70–99)
Potassium: 4.8 mmol/L (ref 3.5–5.1)
Sodium: 136 mmol/L (ref 135–145)
Total Bilirubin: 0.5 mg/dL (ref 0.3–1.2)
Total Protein: 6.9 g/dL (ref 6.5–8.1)

## 2019-04-05 NOTE — Progress Notes (Signed)
Patient d/ced home with his wife and brother.

## 2019-04-05 NOTE — Discharge Instructions (Signed)
Chemoembolization, Care After This sheet gives you information about how to care for yourself after your procedure. Your health care provider may also give you more specific instructions. If you have problems or questions, contact your health care provider. What can I expect after the procedure? After the procedure, it is common to have:  Pain, nausea, vomiting, and fever (post-embolization syndrome). If your fever gets worse, tell your health care provider.  Fatigue.  Loss of appetite. This should gradually improve after about 1 week.  Soreness and tenderness in the area where the needle and catheter were placed (puncture site). Follow these instructions at home:  Puncture site care  Follow instructions from your health care provider about how to take care of the puncture site. Make sure you: ? Wash your hands with soap and water before you change your bandage (dressing). If soap and water are not available, use hand sanitizer. ? Change your dressing as told by your health care provider. ? Leave stitches (sutures), skin glue, or adhesive strips in place. These skin closures may need to stay in place for 2 weeks or longer. If adhesive strip edges start to loosen and curl up, you may trim the loose edges. Do not remove adhesive strips completely unless your health care provider tells you to do that.  Check your puncture site every day for signs of infection. Check for: ? More redness, swelling, or pain. ? More fluid or blood. ? Warmth. ? Pus or a bad smell. Activity  Rest and return to your normal activities as told by your health care provider. Ask your health care provider what activities are safe for you.  Do not drive for 24 hours after the procedure if you were given a medicine to help you relax (sedative).  Do not lift anything that is heavier than 10 lb (4.5 kg) until your health care provider says that it is safe. Medicines  Take over-the-counter and prescription medicines  only as told by your health care provider.  Do not drive or use heavy machinery while taking prescription pain medicine. General instructions  To prevent or treat constipation while you are taking prescription pain medicine, your health care provider may recommend that you: ? Drink enough fluid to keep your urine clear or pale yellow. ? Take over-the-counter or prescription medicines. ? Eat foods that are high in fiber, such as fresh fruits and vegetables, whole grains, and beans. ? Limit foods that are high in fat and processed sugars, such as fried and sweet foods.  Eat frequent small meals until your appetite returns. Follow instructions from your health care provider about eating or drinking restrictions.  Do not take baths, swim, or use a hot tub until your health care provider approves. You may take showers. Wash your puncture site with mild soap and water and pat the area dry.  Wear compression stockings as told by your health care provider. These stockings help to prevent blood clots and reduce swelling in your legs.  If you were given a small breathing device (incentive spirometer), use it as directed. It helps keep your lungs clear while you are recovering. Use it for as long as directed.  Keep all follow-up visits as told by your health care provider. This is important. You may need to have blood tests and imaging tests done. Contact a health care provider if:  You have more redness, swelling, or pain around your puncture site.  You have more fluid or blood coming from your puncture site.  Your puncture site feels warm to the touch.  You have pus or a bad smell coming from your puncture site.  You have a fever that gets worse or is higher than what your health care provider told you to expect.  You have pain that gets worse or does not get better with medicine.  You have nausea or vomiting that lasts for more than 2 days.  You cannot drink fluids without  vomiting.  You develop a rash. Get help right away if:  You develop any of the following in your legs: ? Pain. ? Swelling. ? Skin that is cold or pale or turns blue.  You develop shortness of breath.  You faint.  You have chest pain.  You have weakness or difficulty moving your arms or legs.  You have changes in your speech or your vision. This information is not intended to replace advice given to you by your health care provider. Make sure you discuss any questions you have with your health care provider. Document Released: 04/08/2011 Document Revised: 07/23/2017 Document Reviewed: 05/06/2016 Elsevier Patient Education  2020 [employer].

## 2019-04-05 NOTE — Discharge Summary (Signed)
Patient ID: Aaron [name] MRN: [id number] DOB/AGE: [birth year] 73 y.o.  Admit date: 04/04/2019 Discharge date: 04/05/2019  Supervising Physician: Markus Daft  Patient Status: [facility] - In-pt  Admission Diagnoses: Multifocal hepatocellular carcinoma  Discharge Diagnoses: Multifocal hepatocellular carcinoma, status post treatment of right hepatic lobe lesion with chemoembolization (doxorubicin) and minimal chemoembolization of left hepatic lobe lesion on 04/04/19 Active Problems:   Hepatocellular carcinoma ([place])  Past Medical History:  Diagnosis Date   Arthritis    Diabetes mellitus without complication ([place])    type II    Elevated liver enzymes    Fatty liver    Hepatitis C    genotype 1A status post treatment with Linzie Collin and Ribavarin for 24 weeks   Hepatocellular carcinoma ([place]) 01/30/2014   Path   History of colon polyps    Hyperlipidemia    Hypertension    Iron deficiency anemia    hx of    MGUS (monoclonal gammopathy of unknown significance)    Past Surgical History:  Procedure Laterality Date   COLONOSCOPY     ESOPHAGOGASTRODUODENOSCOPY  2016   IR ANGIOGRAM SELECTIVE EACH ADDITIONAL VESSEL  04/04/2019   IR ANGIOGRAM SELECTIVE EACH ADDITIONAL VESSEL  04/04/2019   IR ANGIOGRAM SELECTIVE EACH ADDITIONAL VESSEL  04/04/2019   IR ANGIOGRAM SELECTIVE EACH ADDITIONAL VESSEL  04/04/2019   IR ANGIOGRAM SELECTIVE EACH ADDITIONAL VESSEL  04/04/2019   IR ANGIOGRAM SELECTIVE EACH ADDITIONAL VESSEL  04/04/2019   IR ANGIOGRAM SELECTIVE EACH ADDITIONAL VESSEL  04/04/2019   IR ANGIOGRAM VISCERAL SELECTIVE  04/04/2019   IR EMBO TUMOR ORGAN ISCHEMIA INFARCT INC GUIDE ROADMAPPING  04/04/2019   IR GENERIC HISTORICAL  02/26/2016   IR RADIOLOGIST EVAL & MGMT 02/26/2016 Markus Daft, MD [facility] INTERV RAD   IR GENERIC HISTORICAL  01/16/2016   IR RADIOLOGIST EVAL & MGMT 01/16/2016 Markus Daft, MD [facility] INTERV RAD   IR GENERIC HISTORICAL  06/24/2016   IR RADIOLOGIST EVAL & MGMT 06/24/2016  Aletta Edouard, MD [facility] INTERV RAD   IR RADIOLOGIST EVAL & MGMT  12/17/2016   IR RADIOLOGIST EVAL & MGMT  06/16/2017   IR RADIOLOGIST EVAL & MGMT  09/08/2017   IR RADIOLOGIST EVAL & MGMT  10/26/2017   IR RADIOLOGIST EVAL & MGMT  05/12/2018   IR RADIOLOGIST EVAL & MGMT  07/26/2018   IR RADIOLOGIST EVAL & MGMT  03/22/2019   IR US GUIDE VASC ACCESS RIGHT  04/04/2019   POLYPECTOMY     RADIOFREQUENCY ABLATION N/A 07/21/2017   Procedure: CT MICROWAVE THERMAL ABLATION-LIVER;  Surgeon: Markus Daft, MD;  Location: [facility] ORS;  Service: Anesthesiology;  Laterality: N/A;   RADIOFREQUENCY ABLATION N/A 06/29/2018   Procedure: CT MICROWAVE THERMAL ABLATION;  Surgeon: Markus Daft, MD;  Location: [facility] ORS;  Service: Anesthesiology;  Laterality: N/A;      Discharged Condition: good  Hospital Course: Mr. Aaron [name] is a 73 year old male with history of hep C, MGUS, CKD, biopsy-proven hepatocellular carcinoma and status post multiple liver directed therapies to the primary lesionand another areaof disease in segment 8. Recent liver MRI demonstrates at least 2 new areas of concern around the ablated segment 8 lesion. Largest lesion measures up to 3.3 cm and the other lesion measures 2.6 cm.  Following recent discussions with patient/oncology/Dr. Anselm Pancoast he was deemed an appropriate candidate for hepatic tumor chemoembolization and underwent treatment of right hepatic lobe lesion with chemoembolization/doxorubicin and minimal chemoembolization of the left hepatic lobe lesion on 04/04/19.  The procedure was performed without immediate complications and the patient was  admitted to the hospital for overnight observation.  Overnight he did well with no major complaints.  On the morning of discharge he was stable.  He was able to ambulate, void and tolerate his diet without significant difficulty.  He denied fever, chest pain, dyspnea, cough, abdominal/back pain, nausea, vomiting or bleeding.  Follow-up chemistry panel revealed  creatinine 1.48 (1.4), normal K/ total bilirubin, normal AST/ALT/alk phosphatase.  Above findings were discussed with Dr. Anselm Pancoast and patient was deemed stable for discharge at this time.  He will continue his current home medications.  IR staff will call patient in 2 weeks for follow-up.  We will attempt to schedule MRI of the abdomen in 1 month.  Patient was also instructed to avoid strenuous activity and stay well-hydrated.  Consults: none  Significant Diagnostic Studies:  Results for orders placed or performed during the hospital encounter of 04/04/19  SARS Coronavirus 2 [facility] order, Performed in [facility] hospital lab)  Result Value Ref Range   SARS Coronavirus 2 NEGATIVE NEGATIVE  CBC with Differential/Platelet  Result Value Ref Range   WBC 6.7 4.0 - 10.5 K/uL   RBC 5.57 4.22 - 5.81 MIL/uL   Hemoglobin 12.1 (L) 13.0 - 17.0 g/dL   HCT 40.9 39.0 - 52.0 %   MCV 73.4 (L) 80.0 - 100.0 fL   MCH 21.7 (L) 26.0 - 34.0 pg   MCHC 29.6 (L) 30.0 - 36.0 g/dL   RDW 15.6 (H) 11.5 - 15.5 %   Platelets 281 150 - 400 K/uL   nRBC 0.0 0.0 - 0.2 %   Neutrophils Relative % 57 %   Neutro Abs 3.8 1.7 - 7.7 K/uL   Lymphocytes Relative 32 %   Lymphs Abs 2.1 0.7 - 4.0 K/uL   Monocytes Relative 8 %   Monocytes Absolute 0.6 0.1 - 1.0 K/uL   Eosinophils Relative 2 %   Eosinophils Absolute 0.1 0.0 - 0.5 K/uL   Basophils Relative 1 %   Basophils Absolute 0.0 0.0 - 0.1 K/uL   Immature Granulocytes 0 %   Abs Immature Granulocytes 0.02 0.00 - 0.07 K/uL  Comprehensive metabolic panel  Result Value Ref Range   Sodium 139 135 - 145 mmol/L   Potassium 4.2 3.5 - 5.1 mmol/L   Chloride 100 98 - 111 mmol/L   CO2 27 22 - 32 mmol/L   Glucose, Bld 150 (H) 70 - 99 mg/dL   BUN 20 8 - 23 mg/dL   Creatinine, Ser 1.40 (H) 0.61 - 1.24 mg/dL   Calcium 10.0 8.9 - 10.3 mg/dL   Total Protein 8.8 (H) 6.5 - 8.1 g/dL   Albumin 4.6 3.5 - 5.0 g/dL   AST 21 15 - 41 U/L   ALT 21 0 - 44 U/L   Alkaline Phosphatase 47 38 - 126  U/L   Total Bilirubin 0.6 0.3 - 1.2 mg/dL   GFR calc non Af Amer 50 (L) >60 mL/min   GFR calc Af Amer 58 (L) >60 mL/min   Anion gap 12 5 - 15  Protime-INR  Result Value Ref Range   Prothrombin Time 11.8 11.4 - 15.2 seconds   INR 0.9 0.8 - 1.2  Comprehensive metabolic panel  Result Value Ref Range   Sodium 136 135 - 145 mmol/L   Potassium 4.8 3.5 - 5.1 mmol/L   Chloride 104 98 - 111 mmol/L   CO2 23 22 - 32 mmol/L   Glucose, Bld 213 (H) 70 - 99 mg/dL   BUN 25 (H) 8 -  23 mg/dL   Creatinine, Ser 1.48 (H) 0.61 - 1.24 mg/dL   Calcium 8.7 (L) 8.9 - 10.3 mg/dL   Total Protein 6.9 6.5 - 8.1 g/dL   Albumin 3.4 (L) 3.5 - 5.0 g/dL   AST 22 15 - 41 U/L   ALT 19 0 - 44 U/L   Alkaline Phosphatase 38 38 - 126 U/L   Total Bilirubin 0.5 0.3 - 1.2 mg/dL   GFR calc non Af Amer 47 (L) >60 mL/min   GFR calc Af Amer 54 (L) >60 mL/min   Anion gap 9 5 - 15  Glucose, capillary  Result Value Ref Range   Glucose-Capillary 264 (H) 70 - 99 mg/dL     Treatments: Hepatic arteriogram with treatment of right hepatic lobe lesion with chemoembolization (doxorubicin) and minimal chemoembolization of left hepatic lobe lesion on 04/04/19   Discharge Exam: Blood pressure 138/76, pulse 69, temperature 98.2 F (36.8 C), temperature source Oral, resp. rate 16, height 6' (1.829 m), weight 272 lb (123.4 kg), SpO2 100 %. Awake, alert.  Chest clear to auscultation bilaterally.  Heart with regular rate and rhythm.  Abdomen protuberant, soft, positive bowel sounds, nontender.  Trace to 1+ pretibial edema bilaterally.  Disposition: Discharge disposition: 01-Home or Self Care       Discharge Instructions    Call MD for:  difficulty breathing, headache or visual disturbances   Complete by: As directed    Call MD for:  extreme fatigue   Complete by: As directed    Call MD for:  hives   Complete by: As directed    Call MD for:  persistant dizziness or light-headedness   Complete by: As directed    Call MD for:   persistant nausea and vomiting   Complete by: As directed    Call MD for:  redness, tenderness, or signs of infection (pain, swelling, redness, odor or green/yellow discharge around incision site)   Complete by: As directed    Call MD for:  severe uncontrolled pain   Complete by: As directed    Call MD for:  temperature >100.4   Complete by: As directed    Change dressing (specify)   Complete by: As directed    May apply Band-Aid to puncture site right groin for the next 2 to 3 days.  May change daily, may wash site with soap and water   Diet - low sodium heart healthy   Complete by: As directed    Discharge instructions   Complete by: As directed    Continue current home medications, stay well-hydrated   Driving Restrictions   Complete by: As directed    No driving for next 24 hours   Increase activity slowly   Complete by: As directed    Lifting restrictions   Complete by: As directed    No heavy lifting for the next 3 to 4 days   May shower / Bathe   Complete by: As directed    May walk up steps   Complete by: As directed      Allergies as of 04/05/2019   No Known Allergies     Medication List    TAKE these medications   ALPRAZolam 0.5 MG tablet Commonly known as: Xanax Take 1 tablet (0.5 mg total) by mouth as directed. Take 2 tablets (1.0 mg total) prior to MR scan.  Make take an additional 0.5 mg po if needed.   aspirin EC 81 MG tablet Take 81 mg by mouth every  morning.   Basaglar KwikPen 100 UNIT/ML Sopn Inject 32 Units into the skin daily after breakfast.   cholecalciferol 1000 units tablet Commonly known as: VITAMIN D Take 1,000 Units by mouth every morning.   lisinopril-hydrochlorothiazide 20-12.5 MG tablet Commonly known as: ZESTORETIC Take 1 tablet by mouth every morning.   metFORMIN 1000 MG tablet Commonly known as: GLUCOPHAGE Take 1,000 mg by mouth 2 (two) times daily with a meal.   multivitamin with minerals Tabs tablet Take 1 tablet daily by  mouth.   oxyCODONE 5 MG immediate release tablet Commonly known as: Oxy IR/ROXICODONE Take 1 tablet (5 mg total) by mouth every 6 (six) hours as needed for up to 15 doses for moderate pain or severe pain.   P[place]o 0.7 % Soln Generic drug: Olopatadine HCl Place 1 drop into both eyes every morning.   simvastatin 40 MG tablet Commonly known as: ZOCOR Take 20 mg by mouth every evening.            Discharge Care Instructions  (From admission, onward)         Start     Ordered   04/05/19 0000  Change dressing (specify)    Comments: May apply Band-Aid to puncture site right groin for the next 2 to 3 days.  May change daily, may wash site with soap and water   04/05/19 0948         Follow-up Information    Markus Daft, MD Follow up.   Specialties: Interventional Radiology, Radiology Why: Radiology service will call you in 2 weeks; please call [phone] or [phone] with any questions. Contact information: [place] [address] [phone]        Brunetta Genera, MD Follow up.   Specialties: Hematology, Oncology Why: Follow-up with Dr. Irene Limbo as scheduled Contact information: [place] [address] [phone]            Electronically Signed: D. Rowe Robert, PA-C 04/05/2019, 9:50 AM   I have spent Less Than 30 Minutes discharging Aaron [name].

## 2019-04-11 ENCOUNTER — Ambulatory Visit
Admission: RE | Admit: 2019-04-11 | Discharge: 2019-04-11 | Disposition: A | Discharge status: Home / Self Care | Payer: Medicare HMO | Source: Ambulatory Visit | Attending: Radiology | Admitting: Radiology

## 2019-04-11 ENCOUNTER — Encounter: Payer: Self-pay | Admitting: *Deleted

## 2019-04-11 ENCOUNTER — Other Ambulatory Visit: Payer: Self-pay

## 2019-04-11 DIAGNOSIS — C220 Liver cell carcinoma: Secondary | ICD-10-CM | POA: Diagnosis not present

## 2019-04-11 HISTORY — PX: IR RADIOLOGIST EVAL & MGMT: IMG5224

## 2019-04-11 NOTE — Progress Notes (Signed)
Chief Complaint: Patient was consulted remotely today (TeleHealth) for follow-up of hepatocellular carcinoma at the request of Allred,Darrell K.    Referring Physician(s): [name]  History of Present Illness: Aaron [name] is a 73 y.o. male with recurrent hepatocellular carcinoma.  Patient has been treated with multiple liver directed therapies including catheter directed embolization and CT-guided ablation since 2015.  Recent imaging demonstrated 2 new lesions near previous ablation site.  Patient underwent chemoembolization with DEB-TACE on 04/04/2019.  Patient tolerated the procedure well and was kept overnight for observation.  His biggest complaint was numbness and soreness at the urinary catheter site.  He had penile symptoms for a couple days after the procedure but these have resolved.  Currently he denies hematuria or dysuria.  No abdominal pain except for one episode of stomach pain on Sunday that resolved with antacids.  Patient tolerated the procedure well on 04/04/2019 but the procedure had some technical difficulties.  The lesion being supplied from the right hepatic artery branch was successfully treated with the drug-eluting beads.  The lesion being supplied through the left hepatic artery was not adequately treated due to spasm or dissection of the feeding vessel.  According to the patient, he has no symptoms at the right groin puncture site.  He denies swelling, bruising or hematoma at this location.  No new leg or musculoskeletal issues.  Past Medical History:  Diagnosis Date   Arthritis    Diabetes mellitus without complication ([place])    type II    Elevated liver enzymes    Fatty liver    Hepatitis C    genotype 1A status post treatment with Linzie Collin and Ribavarin for 24 weeks   Hepatocellular carcinoma ([place]) 01/30/2014   Path   History of colon polyps    Hyperlipidemia    Hypertension    Iron deficiency anemia    hx of    MGUS (monoclonal gammopathy of  unknown significance)     Past Surgical History:  Procedure Laterality Date   COLONOSCOPY     ESOPHAGOGASTRODUODENOSCOPY  2016   IR ANGIOGRAM SELECTIVE EACH ADDITIONAL VESSEL  04/04/2019   IR ANGIOGRAM SELECTIVE EACH ADDITIONAL VESSEL  04/04/2019   IR ANGIOGRAM SELECTIVE EACH ADDITIONAL VESSEL  04/04/2019   IR ANGIOGRAM SELECTIVE EACH ADDITIONAL VESSEL  04/04/2019   IR ANGIOGRAM SELECTIVE EACH ADDITIONAL VESSEL  04/04/2019   IR ANGIOGRAM SELECTIVE EACH ADDITIONAL VESSEL  04/04/2019   IR ANGIOGRAM SELECTIVE EACH ADDITIONAL VESSEL  04/04/2019   IR ANGIOGRAM VISCERAL SELECTIVE  04/04/2019   IR EMBO TUMOR ORGAN ISCHEMIA INFARCT INC GUIDE ROADMAPPING  04/04/2019   IR GENERIC HISTORICAL  02/26/2016   IR RADIOLOGIST EVAL & MGMT 02/26/2016 Markus Daft, MD [facility]   IR GENERIC HISTORICAL  01/16/2016   IR RADIOLOGIST EVAL & MGMT 01/16/2016 Markus Daft, MD [facility]   IR GENERIC HISTORICAL  06/24/2016   IR RADIOLOGIST EVAL & MGMT 06/24/2016 Aletta Edouard, MD [facility]   IR RADIOLOGIST EVAL & MGMT  12/17/2016   IR RADIOLOGIST EVAL & MGMT  06/16/2017   IR RADIOLOGIST EVAL & MGMT  09/08/2017   IR RADIOLOGIST EVAL & MGMT  10/26/2017   IR RADIOLOGIST EVAL & MGMT  05/12/2018   IR RADIOLOGIST EVAL & MGMT  07/26/2018   IR RADIOLOGIST EVAL & MGMT  03/22/2019   IR US GUIDE VASC ACCESS RIGHT  04/04/2019   POLYPECTOMY     RADIOFREQUENCY ABLATION N/A 07/21/2017   Procedure: CT MICROWAVE THERMAL ABLATION-LIVER;  Surgeon: Markus Daft, MD;  Location: [facility];  Service: Anesthesiology;  Laterality: N/A;   RADIOFREQUENCY ABLATION N/A 06/29/2018   Procedure: CT MICROWAVE THERMAL ABLATION;  Surgeon: Markus Daft, MD;  Location: [facility];  Service: Anesthesiology;  Laterality: N/A;    Allergies: Patient has no known allergies.  Medications: Prior to Admission medications   Medication Sig Start Date End Date Taking? Authorizing Provider  ALPRAZolam Duanne Moron) 0.5 MG tablet Take 1 tablet (0.5  mg total) by mouth as directed. Take 2 tablets (1.0 mg total) prior to MR scan.  Make take an additional 0.5 mg po if needed. 10/26/17   Markus Daft, MD  aspirin EC 81 MG tablet Take 81 mg by mouth every morning.     [provider]  cholecalciferol (VITAMIN D) 1000 UNITS tablet Take 1,000 Units by mouth every morning.     [provider]  Insulin Glargine (BASAGLAR KWIKPEN) 100 UNIT/ML SOPN Inject 32 Units into the skin daily after breakfast.    [provider]  lisinopril-hydrochlorothiazide (PRINZIDE,ZESTORETIC) 20-12.5 MG tablet Take 1 tablet by mouth every morning.     [provider]  metFORMIN (GLUCOPHAGE) 1000 MG tablet Take 1,000 mg by mouth 2 (two) times daily with a meal.    [provider]  Multiple Vitamin (MULTIVITAMIN WITH MINERALS) TABS tablet Take 1 tablet daily by mouth.    [provider]  oxyCODONE (OXY IR/ROXICODONE) 5 MG immediate release tablet Take 1 tablet (5 mg total) by mouth every 6 (six) hours as needed for up to 15 doses for moderate pain or severe pain. Patient not taking: Reported on 04/04/2019 06/30/18   Candiss Norse A, PA-C  PAZEO 0.7 % SOLN Place 1 drop into both eyes every morning.  05/09/18   [provider]  simvastatin (ZOCOR) 40 MG tablet Take 20 mg by mouth every evening.     [provider]     No family history on file.  Social History   Socioeconomic History   Marital status: Married    Spouse name: Not on file   Number of children: Not on file   Years of education: Not on file   Highest education level: Not on file  Occupational History   Not on file  Social Needs   Financial resource strain: Not on file   Food insecurity    Worry: Not on file    Inability: Not on file   Transportation needs    Medical: Not on file    Non-medical: Not on file  Tobacco Use   Smoking status: Former Smoker    Quit date: 01/30/1994    Years since quitting: 25.2   Smokeless  tobacco: Never Used   Tobacco comment: stopped 25 yrs ago  Substance and Sexual Activity   Alcohol use: No    Comment: stopped 30 years ago    Drug use: No   Sexual activity: Not Currently  Lifestyle   Physical activity    Days per week: Not on file    Minutes per session: Not on file   Stress: Not on file  Relationships   Social connections    Talks on phone: Not on file    Gets together: Not on file    Attends religious service: Not on file    Active member of club or organization: Not on file    Attends meetings of clubs or organizations: Not on file    Relationship status: Not on file  Other Topics Concern  Not on file  Social History Narrative   Not on file      Review of Systems  Constitutional: Negative.   Gastrointestinal: Positive for abdominal pain.  Genitourinary: Positive for penile pain.    Physical Exam No direct physical exam was performed   Vital Signs: There were no vitals taken for this visit.  Imaging: Mr Abdomen Wwo Contrast  Result Date: 03/14/2019 CLINICAL DATA:  Hepatocellular carcinoma status post radiofrequency ablation 06/29/2018. EXAM: MRI ABDOMEN WITHOUT AND WITH CONTRAST TECHNIQUE: Multiplanar multisequence MR imaging of the abdomen was performed both before and after the administration of intravenous contrast. CONTRAST:  18mL EOVIST GADOXETATE DISODIUM 0.25 MOL/L IV SOLN COMPARISON:  Abdominal MRI 05/20/2017 and 05/12/2018. FINDINGS: Despite efforts by the technologist and patient, mild motion artifact is present on today's exam and could not be eliminated. This reduces exam sensitivity and specificity. Motion is greatest on the immediate postcontrast images. Lower chest: Mild right basilar atelectasis. The visualized lower chest otherwise appears unremarkable. Hepatobiliary: Mild hepatic steatosis is again noted. The ablation zone defect in segment 7 appears stable, measuring 3.1 x 3.1 cm on image 22/6. This demonstrates central T1  hyperintensity, but no enhancement following contrast. There are 2 adjacent ablation zone defects in segment 8 which are now associated with restricted diffusion, surrounding nodular arterial phase enhancement and delayed washout. The nodular enhancement associated with anterior lesion is predominately inferior to the lesion, measuring up to 3.3 x 2.4 cm on image 34/8. The more posterior lesion extends into segment 7 and has an enhancing nodular component measuring 2.6 x 1.7 cm on image 30/8. No other lesions are identified. No evidence of gallstones, gallbladder wall thickening or biliary dilatation. Pancreas: Unremarkable. No pancreatic ductal dilatation or surrounding inflammatory changes. Spleen: Normal in size without focal abnormality. Adrenals/Urinary Tract: Both adrenal glands appear normal. There are stable small renal cysts bilaterally. No evidence of renal mass or hydronephrosis. Stomach/Bowel: No evidence of bowel wall thickening, distention or surrounding inflammatory change. Vascular/Lymphatic: There are no enlarged abdominal lymph nodes. No significant vascular findings. The portal vein appears normal without tumor. Other: No ascites or peritoneal nodularity. Musculoskeletal: No acute or significant osseous findings. Mild lumbar spondylosis. IMPRESSION: 1. New areas of restricted diffusion, surrounding nodular arterial phase enhancement and delayed washout surrounding the ablation zone defects anteriorly in segments 8 and 7 consistent with local recurrence of hepatocellular carcinoma. 2. The peripheral ablation zone defect laterally in segment 8 is unchanged. 3. No extrahepatic tumor identified. Electronically Signed   By: Richardean Sale M.D.   On: 03/14/2019 14:35   Ir Angiogram Visceral Selective  Result Date: 04/05/2019 INDICATION: 73 year old with hepatocellular carcinoma. Patient has had multiple liver directed therapies including chemoembolization and multiple microwave ablation. Most  recent imaging demonstrates 2 suspicious lesions around previous ablation sites near segment 8 of the liver. Patient presents for chemoembolization. EXAM: 1. VISCERAL ANGIOGRAPHY INCLUDING CELIAC ARTERY, COMMON HEPATIC ARTERY, LEFT HEPATIC ARTERY, LEFT HEPATIC ARTERY BRANCHES, RIGHT HEPATIC ARTERY AND RIGHT HEPATIC ARTERY BRANCHES 2. CHEMOEMBOLIZATION WITH DRUG-ELUTING BEADS IN LEFT AND RIGHT HEPATIC ARTERY BRANCHES 3. ULTRASOUND GUIDANCE FOR VASCULAR ACCESS MEDICATIONS: Zosyn 3.375 g. The antibiotic was administered within 1 hour of the procedure. Decadron 8 mg. DEB-TACE: 75 mg doxorubicin and 100-300 micron LC beads combined with 20 mL of Omnipaque 300 ANESTHESIA/SEDATION: Moderate (conscious) sedation was employed during this procedure. A total of Versed 6.0 mg and Fentanyl 100 mcg was administered intravenously. Moderate Sedation Time: 2 hours and 40 minutes. The patient's level of consciousness  and vital signs were monitored continuously by radiology nursing throughout the procedure under my direct supervision. CONTRAST:  193 mL Omnipaque 300 FLUOROSCOPY TIME:  Fluoroscopy Time: 39 minutes, 12 seconds, 3734 mGy COMPLICATIONS: None immediate. PROCEDURE: The procedure was explained to the patient. The risks and benefits of the procedure were discussed and the patient's questions were addressed. Informed consent was obtained from the patient. Patient was placed supine on the interventional table. Ultrasound demonstrated a patent right common femoral artery. Ultrasound image was saved for documentation. The right groin was prepped and draped in sterile fashion. Maximal barrier sterile technique was utilized including caps, mask, sterile gowns, sterile gloves, sterile drape, hand hygiene and skin antiseptic. Right groin was anesthetized with 1% lidocaine. Small incision was made. Using ultrasound guidance, 21 gauge needle was directed into the right common femoral artery and micropuncture dilator set was placed. 5  [place] vascular sheath was placed. C2 catheter was used to cannulate the celiac artery. Celiac arteriography was performed. C2 catheter was advanced into the common hepatic artery using a Glidewire. Angiographic run was performed in the common hepatic artery. The left hepatic artery and medial segment branches were selected with a Progreat microcatheter. Selective angiography in the left hepatic artery was performed. Due to spasm of the branch supplying the hepatic tumor, the microcatheter was pulled out of the left hepatic artery and used to cannulate the right hepatic artery. Selective right hepatic arteriography was performed. Central or medial branch of the right hepatic lobe was found to be supplying a hypervascular lesion in the central aspect of the liver. This branch was selected and additional angiography was performed. Chemoembolization was performed in this branch with the drug-eluting beads. The drug-eluting beads were administered very slowly until there was near stasis in the branches supplying the tumor. Multiple follow-up angiograms were obtained. Attention was directed back to the left hepatic artery. Medial branch supplying the hypervascular lesion was targeted again. Unfortunately, the primary vessel supplying the hypervascular lesion was either dissected or spasmed. 200 mcg of nitroglycerin was given in the left hepatic artery branch. After administration of nitroglycerin, the catheter was advanced into the vessel supplying the hypervascular tumor. Drug-eluting beads was administrated but there was poor flow and only a very small quantity of the beads could be administered. Follow-up angiograms were obtained in left hepatic lobe. Microcatheter and 5 French catheter removed. Final angiogram was performed through the right groin sheath. The right groin sheath was removed using an ExoSeal closure device. Hemostasis at the right groin at the end of the procedure. FINDINGS: Celiac trunk is patent with  flow in the splenic artery, left gastric artery and common hepatic artery. Incidentally there appears to be replaced left hepatic artery coming off the left gastric artery. GDA is widely patent. Portal venous system is patent on the delayed images. Common hepatic artery injection: There are 2 hypervascular lesions in the central aspect of the liver corresponding with the areas of concern on the recent MRI. Irregularity along the periphery of the right hepatic lobe likely related to previous ablation sites. Near the end of the procedure, repeat common hepatic arteriogram was performed demonstrating very slow flow to the treated branch off the right hepatic lobe. There is minimal blushing from the branch off the left hepatic lobe but there was either spasm or occlusion of the feeding branch in the left hepatic artery. Selective left hepatic arteriography: Branch of the left hepatic lobe was selected and there was arterial blushing to the tumor. Shortly after  the targeted vessel was selected, there was marked spasm within the feeding vessel and there was no longer significant flow to the lesion. Due to spasm, the catheter was pulled out of the left hepatic artery and attention was directed to the right hepatic artery. Attention was directed back to the left hepatic branch supplying the hypervascular lesion at the end of the procedure. Again there was poor flow to the hypervascular lesion due to spasm or possible dissection involving the feeding vessel. Following administration of 200 mcg of nitroglycerin, catheter was able to be advanced into the feeding vessel but there was poor flow. Minimal amount of the drug-eluting beads is able to be injected before there was stagnant flow. Feeding vessel was essentially occluded due to spasm or dissection at the end of the procedure. No significant filling with hypervascular lesion at the end of the procedure. Right hepatic arteriography: 2 main branches of the right hepatic  artery. Both of these main branches were selected and injected. The more central branch was supplying the hypervascular lesion. Drug-eluting beads were injected into the feeding branch until there was near stasis in this vessel. The drug-eluting beads were injected very slowly with multiple periods of waiting between injections to ensure adequate flow to the tumor. IMPRESSION: 1. Two hypervascular hepatic lesions were identified with angiography. Angiographic findings correspond with the hypervascular lesions seen on the recent MRI. 2. Hypervascular lesions were being supplied by both left and right hepatic arteries. The lesion being supplied by the right hepatic artery was successfully treated with chemoembolization using drug-eluting bead. Approximately 25% of the dose was given into this hypervascular lesion. The lesion being supplied by the left hepatic lobe was incompletely treated because there was marked spasm and possible dissection involving the feeding vessel. Unable to give a sufficient quantity of drug-eluting beads to the lesion from the left hepatic artery due to poor inflow. Electronically Signed   By: Markus Daft M.D.   On: 04/05/2019 08:48   Ir Angiogram Selective Each Additional Vessel  Result Date: 04/05/2019 INDICATION: 73 year old with hepatocellular carcinoma. Patient has had multiple liver directed therapies including chemoembolization and multiple microwave ablation. Most recent imaging demonstrates 2 suspicious lesions around previous ablation sites near segment 8 of the liver. Patient presents for chemoembolization. EXAM: 1. VISCERAL ANGIOGRAPHY INCLUDING CELIAC ARTERY, COMMON HEPATIC ARTERY, LEFT HEPATIC ARTERY, LEFT HEPATIC ARTERY BRANCHES, RIGHT HEPATIC ARTERY AND RIGHT HEPATIC ARTERY BRANCHES 2. CHEMOEMBOLIZATION WITH DRUG-ELUTING BEADS IN LEFT AND RIGHT HEPATIC ARTERY BRANCHES 3. ULTRASOUND GUIDANCE FOR VASCULAR ACCESS MEDICATIONS: Zosyn 3.375 g. The antibiotic was administered  within 1 hour of the procedure. Decadron 8 mg. DEB-TACE: 75 mg doxorubicin and 100-300 micron LC beads combined with 20 mL of Omnipaque 300 ANESTHESIA/SEDATION: Moderate (conscious) sedation was employed during this procedure. A total of Versed 6.0 mg and Fentanyl 100 mcg was administered intravenously. Moderate Sedation Time: 2 hours and 40 minutes. The patient's level of consciousness and vital signs were monitored continuously by radiology nursing throughout the procedure under my direct supervision. CONTRAST:  193 mL Omnipaque 300 FLUOROSCOPY TIME:  Fluoroscopy Time: 39 minutes, 12 seconds, 2979 mGy COMPLICATIONS: None immediate. PROCEDURE: The procedure was explained to the patient. The risks and benefits of the procedure were discussed and the patient's questions were addressed. Informed consent was obtained from the patient. Patient was placed supine on the interventional table. Ultrasound demonstrated a patent right common femoral artery. Ultrasound image was saved for documentation. The right groin was prepped and draped in sterile fashion. Maximal  barrier sterile technique was utilized including caps, mask, sterile gowns, sterile gloves, sterile drape, hand hygiene and skin antiseptic. Right groin was anesthetized with 1% lidocaine. Small incision was made. Using ultrasound guidance, 21 gauge needle was directed into the right common femoral artery and micropuncture dilator set was placed. 5 [place] vascular sheath was placed. C2 catheter was used to cannulate the celiac artery. Celiac arteriography was performed. C2 catheter was advanced into the common hepatic artery using a Glidewire. Angiographic run was performed in the common hepatic artery. The left hepatic artery and medial segment branches were selected with a Progreat microcatheter. Selective angiography in the left hepatic artery was performed. Due to spasm of the branch supplying the hepatic tumor, the microcatheter was pulled out of the left  hepatic artery and used to cannulate the right hepatic artery. Selective right hepatic arteriography was performed. Central or medial branch of the right hepatic lobe was found to be supplying a hypervascular lesion in the central aspect of the liver. This branch was selected and additional angiography was performed. Chemoembolization was performed in this branch with the drug-eluting beads. The drug-eluting beads were administered very slowly until there was near stasis in the branches supplying the tumor. Multiple follow-up angiograms were obtained. Attention was directed back to the left hepatic artery. Medial branch supplying the hypervascular lesion was targeted again. Unfortunately, the primary vessel supplying the hypervascular lesion was either dissected or spasmed. 200 mcg of nitroglycerin was given in the left hepatic artery branch. After administration of nitroglycerin, the catheter was advanced into the vessel supplying the hypervascular tumor. Drug-eluting beads was administrated but there was poor flow and only a very small quantity of the beads could be administered. Follow-up angiograms were obtained in left hepatic lobe. Microcatheter and 5 French catheter removed. Final angiogram was performed through the right groin sheath. The right groin sheath was removed using an ExoSeal closure device. Hemostasis at the right groin at the end of the procedure. FINDINGS: Celiac trunk is patent with flow in the splenic artery, left gastric artery and common hepatic artery. Incidentally there appears to be replaced left hepatic artery coming off the left gastric artery. GDA is widely patent. Portal venous system is patent on the delayed images. Common hepatic artery injection: There are 2 hypervascular lesions in the central aspect of the liver corresponding with the areas of concern on the recent MRI. Irregularity along the periphery of the right hepatic lobe likely related to previous ablation sites. Near the  end of the procedure, repeat common hepatic arteriogram was performed demonstrating very slow flow to the treated branch off the right hepatic lobe. There is minimal blushing from the branch off the left hepatic lobe but there was either spasm or occlusion of the feeding branch in the left hepatic artery. Selective left hepatic arteriography: Branch of the left hepatic lobe was selected and there was arterial blushing to the tumor. Shortly after the targeted vessel was selected, there was marked spasm within the feeding vessel and there was no longer significant flow to the lesion. Due to spasm, the catheter was pulled out of the left hepatic artery and attention was directed to the right hepatic artery. Attention was directed back to the left hepatic branch supplying the hypervascular lesion at the end of the procedure. Again there was poor flow to the hypervascular lesion due to spasm or possible dissection involving the feeding vessel. Following administration of 200 mcg of nitroglycerin, catheter was able to be advanced into the feeding  vessel but there was poor flow. Minimal amount of the drug-eluting beads is able to be injected before there was stagnant flow. Feeding vessel was essentially occluded due to spasm or dissection at the end of the procedure. No significant filling with hypervascular lesion at the end of the procedure. Right hepatic arteriography: 2 main branches of the right hepatic artery. Both of these main branches were selected and injected. The more central branch was supplying the hypervascular lesion. Drug-eluting beads were injected into the feeding branch until there was near stasis in this vessel. The drug-eluting beads were injected very slowly with multiple periods of waiting between injections to ensure adequate flow to the tumor. IMPRESSION: 1. Two hypervascular hepatic lesions were identified with angiography. Angiographic findings correspond with the hypervascular lesions seen on  the recent MRI. 2. Hypervascular lesions were being supplied by both left and right hepatic arteries. The lesion being supplied by the right hepatic artery was successfully treated with chemoembolization using drug-eluting bead. Approximately 25% of the dose was given into this hypervascular lesion. The lesion being supplied by the left hepatic lobe was incompletely treated because there was marked spasm and possible dissection involving the feeding vessel. Unable to give a sufficient quantity of drug-eluting beads to the lesion from the left hepatic artery due to poor inflow. Electronically Signed   By: Markus Daft M.D.   On: 04/05/2019 08:48   Ir Angiogram Selective Each Additional Vessel  Result Date: 04/05/2019 INDICATION: 73 year old with hepatocellular carcinoma. Patient has had multiple liver directed therapies including chemoembolization and multiple microwave ablation. Most recent imaging demonstrates 2 suspicious lesions around previous ablation sites near segment 8 of the liver. Patient presents for chemoembolization. EXAM: 1. VISCERAL ANGIOGRAPHY INCLUDING CELIAC ARTERY, COMMON HEPATIC ARTERY, LEFT HEPATIC ARTERY, LEFT HEPATIC ARTERY BRANCHES, RIGHT HEPATIC ARTERY AND RIGHT HEPATIC ARTERY BRANCHES 2. CHEMOEMBOLIZATION WITH DRUG-ELUTING BEADS IN LEFT AND RIGHT HEPATIC ARTERY BRANCHES 3. ULTRASOUND GUIDANCE FOR VASCULAR ACCESS MEDICATIONS: Zosyn 3.375 g. The antibiotic was administered within 1 hour of the procedure. Decadron 8 mg. DEB-TACE: 75 mg doxorubicin and 100-300 micron LC beads combined with 20 mL of Omnipaque 300 ANESTHESIA/SEDATION: Moderate (conscious) sedation was employed during this procedure. A total of Versed 6.0 mg and Fentanyl 100 mcg was administered intravenously. Moderate Sedation Time: 2 hours and 40 minutes. The patient's level of consciousness and vital signs were monitored continuously by radiology nursing throughout the procedure under my direct supervision. CONTRAST:  193 mL  Omnipaque 300 FLUOROSCOPY TIME:  Fluoroscopy Time: 39 minutes, 12 seconds, 6144 mGy COMPLICATIONS: None immediate. PROCEDURE: The procedure was explained to the patient. The risks and benefits of the procedure were discussed and the patient's questions were addressed. Informed consent was obtained from the patient. Patient was placed supine on the interventional table. Ultrasound demonstrated a patent right common femoral artery. Ultrasound image was saved for documentation. The right groin was prepped and draped in sterile fashion. Maximal barrier sterile technique was utilized including caps, mask, sterile gowns, sterile gloves, sterile drape, hand hygiene and skin antiseptic. Right groin was anesthetized with 1% lidocaine. Small incision was made. Using ultrasound guidance, 21 gauge needle was directed into the right common femoral artery and micropuncture dilator set was placed. 5 [place] vascular sheath was placed. C2 catheter was used to cannulate the celiac artery. Celiac arteriography was performed. C2 catheter was advanced into the common hepatic artery using a Glidewire. Angiographic run was performed in the common hepatic artery. The left hepatic artery and medial segment branches were  selected with a Progreat microcatheter. Selective angiography in the left hepatic artery was performed. Due to spasm of the branch supplying the hepatic tumor, the microcatheter was pulled out of the left hepatic artery and used to cannulate the right hepatic artery. Selective right hepatic arteriography was performed. Central or medial branch of the right hepatic lobe was found to be supplying a hypervascular lesion in the central aspect of the liver. This branch was selected and additional angiography was performed. Chemoembolization was performed in this branch with the drug-eluting beads. The drug-eluting beads were administered very slowly until there was near stasis in the branches supplying the tumor. Multiple  follow-up angiograms were obtained. Attention was directed back to the left hepatic artery. Medial branch supplying the hypervascular lesion was targeted again. Unfortunately, the primary vessel supplying the hypervascular lesion was either dissected or spasmed. 200 mcg of nitroglycerin was given in the left hepatic artery branch. After administration of nitroglycerin, the catheter was advanced into the vessel supplying the hypervascular tumor. Drug-eluting beads was administrated but there was poor flow and only a very small quantity of the beads could be administered. Follow-up angiograms were obtained in left hepatic lobe. Microcatheter and 5 French catheter removed. Final angiogram was performed through the right groin sheath. The right groin sheath was removed using an ExoSeal closure device. Hemostasis at the right groin at the end of the procedure. FINDINGS: Celiac trunk is patent with flow in the splenic artery, left gastric artery and common hepatic artery. Incidentally there appears to be replaced left hepatic artery coming off the left gastric artery. GDA is widely patent. Portal venous system is patent on the delayed images. Common hepatic artery injection: There are 2 hypervascular lesions in the central aspect of the liver corresponding with the areas of concern on the recent MRI. Irregularity along the periphery of the right hepatic lobe likely related to previous ablation sites. Near the end of the procedure, repeat common hepatic arteriogram was performed demonstrating very slow flow to the treated branch off the right hepatic lobe. There is minimal blushing from the branch off the left hepatic lobe but there was either spasm or occlusion of the feeding branch in the left hepatic artery. Selective left hepatic arteriography: Branch of the left hepatic lobe was selected and there was arterial blushing to the tumor. Shortly after the targeted vessel was selected, there was marked spasm within the  feeding vessel and there was no longer significant flow to the lesion. Due to spasm, the catheter was pulled out of the left hepatic artery and attention was directed to the right hepatic artery. Attention was directed back to the left hepatic branch supplying the hypervascular lesion at the end of the procedure. Again there was poor flow to the hypervascular lesion due to spasm or possible dissection involving the feeding vessel. Following administration of 200 mcg of nitroglycerin, catheter was able to be advanced into the feeding vessel but there was poor flow. Minimal amount of the drug-eluting beads is able to be injected before there was stagnant flow. Feeding vessel was essentially occluded due to spasm or dissection at the end of the procedure. No significant filling with hypervascular lesion at the end of the procedure. Right hepatic arteriography: 2 main branches of the right hepatic artery. Both of these main branches were selected and injected. The more central branch was supplying the hypervascular lesion. Drug-eluting beads were injected into the feeding branch until there was near stasis in this vessel. The drug-eluting beads were  injected very slowly with multiple periods of waiting between injections to ensure adequate flow to the tumor. IMPRESSION: 1. Two hypervascular hepatic lesions were identified with angiography. Angiographic findings correspond with the hypervascular lesions seen on the recent MRI. 2. Hypervascular lesions were being supplied by both left and right hepatic arteries. The lesion being supplied by the right hepatic artery was successfully treated with chemoembolization using drug-eluting bead. Approximately 25% of the dose was given into this hypervascular lesion. The lesion being supplied by the left hepatic lobe was incompletely treated because there was marked spasm and possible dissection involving the feeding vessel. Unable to give a sufficient quantity of drug-eluting  beads to the lesion from the left hepatic artery due to poor inflow. Electronically Signed   By: Markus Daft M.D.   On: 04/05/2019 08:48   Ir Angiogram Selective Each Additional Vessel  Result Date: 04/05/2019 INDICATION: 73 year old with hepatocellular carcinoma. Patient has had multiple liver directed therapies including chemoembolization and multiple microwave ablation. Most recent imaging demonstrates 2 suspicious lesions around previous ablation sites near segment 8 of the liver. Patient presents for chemoembolization. EXAM: 1. VISCERAL ANGIOGRAPHY INCLUDING CELIAC ARTERY, COMMON HEPATIC ARTERY, LEFT HEPATIC ARTERY, LEFT HEPATIC ARTERY BRANCHES, RIGHT HEPATIC ARTERY AND RIGHT HEPATIC ARTERY BRANCHES 2. CHEMOEMBOLIZATION WITH DRUG-ELUTING BEADS IN LEFT AND RIGHT HEPATIC ARTERY BRANCHES 3. ULTRASOUND GUIDANCE FOR VASCULAR ACCESS MEDICATIONS: Zosyn 3.375 g. The antibiotic was administered within 1 hour of the procedure. Decadron 8 mg. DEB-TACE: 75 mg doxorubicin and 100-300 micron LC beads combined with 20 mL of Omnipaque 300 ANESTHESIA/SEDATION: Moderate (conscious) sedation was employed during this procedure. A total of Versed 6.0 mg and Fentanyl 100 mcg was administered intravenously. Moderate Sedation Time: 2 hours and 40 minutes. The patient's level of consciousness and vital signs were monitored continuously by radiology nursing throughout the procedure under my direct supervision. CONTRAST:  193 mL Omnipaque 300 FLUOROSCOPY TIME:  Fluoroscopy Time: 39 minutes, 12 seconds, 5809 mGy COMPLICATIONS: None immediate. PROCEDURE: The procedure was explained to the patient. The risks and benefits of the procedure were discussed and the patient's questions were addressed. Informed consent was obtained from the patient. Patient was placed supine on the interventional table. Ultrasound demonstrated a patent right common femoral artery. Ultrasound image was saved for documentation. The right groin was prepped and  draped in sterile fashion. Maximal barrier sterile technique was utilized including caps, mask, sterile gowns, sterile gloves, sterile drape, hand hygiene and skin antiseptic. Right groin was anesthetized with 1% lidocaine. Small incision was made. Using ultrasound guidance, 21 gauge needle was directed into the right common femoral artery and micropuncture dilator set was placed. 5 [place] vascular sheath was placed. C2 catheter was used to cannulate the celiac artery. Celiac arteriography was performed. C2 catheter was advanced into the common hepatic artery using a Glidewire. Angiographic run was performed in the common hepatic artery. The left hepatic artery and medial segment branches were selected with a Progreat microcatheter. Selective angiography in the left hepatic artery was performed. Due to spasm of the branch supplying the hepatic tumor, the microcatheter was pulled out of the left hepatic artery and used to cannulate the right hepatic artery. Selective right hepatic arteriography was performed. Central or medial branch of the right hepatic lobe was found to be supplying a hypervascular lesion in the central aspect of the liver. This branch was selected and additional angiography was performed. Chemoembolization was performed in this branch with the drug-eluting beads. The drug-eluting beads were administered very slowly until  there was near stasis in the branches supplying the tumor. Multiple follow-up angiograms were obtained. Attention was directed back to the left hepatic artery. Medial branch supplying the hypervascular lesion was targeted again. Unfortunately, the primary vessel supplying the hypervascular lesion was either dissected or spasmed. 200 mcg of nitroglycerin was given in the left hepatic artery branch. After administration of nitroglycerin, the catheter was advanced into the vessel supplying the hypervascular tumor. Drug-eluting beads was administrated but there was poor flow and only  a very small quantity of the beads could be administered. Follow-up angiograms were obtained in left hepatic lobe. Microcatheter and 5 French catheter removed. Final angiogram was performed through the right groin sheath. The right groin sheath was removed using an ExoSeal closure device. Hemostasis at the right groin at the end of the procedure. FINDINGS: Celiac trunk is patent with flow in the splenic artery, left gastric artery and common hepatic artery. Incidentally there appears to be replaced left hepatic artery coming off the left gastric artery. GDA is widely patent. Portal venous system is patent on the delayed images. Common hepatic artery injection: There are 2 hypervascular lesions in the central aspect of the liver corresponding with the areas of concern on the recent MRI. Irregularity along the periphery of the right hepatic lobe likely related to previous ablation sites. Near the end of the procedure, repeat common hepatic arteriogram was performed demonstrating very slow flow to the treated branch off the right hepatic lobe. There is minimal blushing from the branch off the left hepatic lobe but there was either spasm or occlusion of the feeding branch in the left hepatic artery. Selective left hepatic arteriography: Branch of the left hepatic lobe was selected and there was arterial blushing to the tumor. Shortly after the targeted vessel was selected, there was marked spasm within the feeding vessel and there was no longer significant flow to the lesion. Due to spasm, the catheter was pulled out of the left hepatic artery and attention was directed to the right hepatic artery. Attention was directed back to the left hepatic branch supplying the hypervascular lesion at the end of the procedure. Again there was poor flow to the hypervascular lesion due to spasm or possible dissection involving the feeding vessel. Following administration of 200 mcg of nitroglycerin, catheter was able to be advanced  into the feeding vessel but there was poor flow. Minimal amount of the drug-eluting beads is able to be injected before there was stagnant flow. Feeding vessel was essentially occluded due to spasm or dissection at the end of the procedure. No significant filling with hypervascular lesion at the end of the procedure. Right hepatic arteriography: 2 main branches of the right hepatic artery. Both of these main branches were selected and injected. The more central branch was supplying the hypervascular lesion. Drug-eluting beads were injected into the feeding branch until there was near stasis in this vessel. The drug-eluting beads were injected very slowly with multiple periods of waiting between injections to ensure adequate flow to the tumor. IMPRESSION: 1. Two hypervascular hepatic lesions were identified with angiography. Angiographic findings correspond with the hypervascular lesions seen on the recent MRI. 2. Hypervascular lesions were being supplied by both left and right hepatic arteries. The lesion being supplied by the right hepatic artery was successfully treated with chemoembolization using drug-eluting bead. Approximately 25% of the dose was given into this hypervascular lesion. The lesion being supplied by the left hepatic lobe was incompletely treated because there was marked spasm and possible  dissection involving the feeding vessel. Unable to give a sufficient quantity of drug-eluting beads to the lesion from the left hepatic artery due to poor inflow. Electronically Signed   By: Markus Daft M.D.   On: 04/05/2019 08:48   Ir Angiogram Selective Each Additional Vessel  Result Date: 04/05/2019 INDICATION: 73 year old with hepatocellular carcinoma. Patient has had multiple liver directed therapies including chemoembolization and multiple microwave ablation. Most recent imaging demonstrates 2 suspicious lesions around previous ablation sites near segment 8 of the liver. Patient presents for  chemoembolization. EXAM: 1. VISCERAL ANGIOGRAPHY INCLUDING CELIAC ARTERY, COMMON HEPATIC ARTERY, LEFT HEPATIC ARTERY, LEFT HEPATIC ARTERY BRANCHES, RIGHT HEPATIC ARTERY AND RIGHT HEPATIC ARTERY BRANCHES 2. CHEMOEMBOLIZATION WITH DRUG-ELUTING BEADS IN LEFT AND RIGHT HEPATIC ARTERY BRANCHES 3. ULTRASOUND GUIDANCE FOR VASCULAR ACCESS MEDICATIONS: Zosyn 3.375 g. The antibiotic was administered within 1 hour of the procedure. Decadron 8 mg. DEB-TACE: 75 mg doxorubicin and 100-300 micron LC beads combined with 20 mL of Omnipaque 300 ANESTHESIA/SEDATION: Moderate (conscious) sedation was employed during this procedure. A total of Versed 6.0 mg and Fentanyl 100 mcg was administered intravenously. Moderate Sedation Time: 2 hours and 40 minutes. The patient's level of consciousness and vital signs were monitored continuously by radiology nursing throughout the procedure under my direct supervision. CONTRAST:  193 mL Omnipaque 300 FLUOROSCOPY TIME:  Fluoroscopy Time: 39 minutes, 12 seconds, 2122 mGy COMPLICATIONS: None immediate. PROCEDURE: The procedure was explained to the patient. The risks and benefits of the procedure were discussed and the patient's questions were addressed. Informed consent was obtained from the patient. Patient was placed supine on the interventional table. Ultrasound demonstrated a patent right common femoral artery. Ultrasound image was saved for documentation. The right groin was prepped and draped in sterile fashion. Maximal barrier sterile technique was utilized including caps, mask, sterile gowns, sterile gloves, sterile drape, hand hygiene and skin antiseptic. Right groin was anesthetized with 1% lidocaine. Small incision was made. Using ultrasound guidance, 21 gauge needle was directed into the right common femoral artery and micropuncture dilator set was placed. 5 [place] vascular sheath was placed. C2 catheter was used to cannulate the celiac artery. Celiac arteriography was performed. C2  catheter was advanced into the common hepatic artery using a Glidewire. Angiographic run was performed in the common hepatic artery. The left hepatic artery and medial segment branches were selected with a Progreat microcatheter. Selective angiography in the left hepatic artery was performed. Due to spasm of the branch supplying the hepatic tumor, the microcatheter was pulled out of the left hepatic artery and used to cannulate the right hepatic artery. Selective right hepatic arteriography was performed. Central or medial branch of the right hepatic lobe was found to be supplying a hypervascular lesion in the central aspect of the liver. This branch was selected and additional angiography was performed. Chemoembolization was performed in this branch with the drug-eluting beads. The drug-eluting beads were administered very slowly until there was near stasis in the branches supplying the tumor. Multiple follow-up angiograms were obtained. Attention was directed back to the left hepatic artery. Medial branch supplying the hypervascular lesion was targeted again. Unfortunately, the primary vessel supplying the hypervascular lesion was either dissected or spasmed. 200 mcg of nitroglycerin was given in the left hepatic artery branch. After administration of nitroglycerin, the catheter was advanced into the vessel supplying the hypervascular tumor. Drug-eluting beads was administrated but there was poor flow and only a very small quantity of the beads could be administered. Follow-up angiograms were obtained in  left hepatic lobe. Microcatheter and 5 French catheter removed. Final angiogram was performed through the right groin sheath. The right groin sheath was removed using an ExoSeal closure device. Hemostasis at the right groin at the end of the procedure. FINDINGS: Celiac trunk is patent with flow in the splenic artery, left gastric artery and common hepatic artery. Incidentally there appears to be replaced left  hepatic artery coming off the left gastric artery. GDA is widely patent. Portal venous system is patent on the delayed images. Common hepatic artery injection: There are 2 hypervascular lesions in the central aspect of the liver corresponding with the areas of concern on the recent MRI. Irregularity along the periphery of the right hepatic lobe likely related to previous ablation sites. Near the end of the procedure, repeat common hepatic arteriogram was performed demonstrating very slow flow to the treated branch off the right hepatic lobe. There is minimal blushing from the branch off the left hepatic lobe but there was either spasm or occlusion of the feeding branch in the left hepatic artery. Selective left hepatic arteriography: Branch of the left hepatic lobe was selected and there was arterial blushing to the tumor. Shortly after the targeted vessel was selected, there was marked spasm within the feeding vessel and there was no longer significant flow to the lesion. Due to spasm, the catheter was pulled out of the left hepatic artery and attention was directed to the right hepatic artery. Attention was directed back to the left hepatic branch supplying the hypervascular lesion at the end of the procedure. Again there was poor flow to the hypervascular lesion due to spasm or possible dissection involving the feeding vessel. Following administration of 200 mcg of nitroglycerin, catheter was able to be advanced into the feeding vessel but there was poor flow. Minimal amount of the drug-eluting beads is able to be injected before there was stagnant flow. Feeding vessel was essentially occluded due to spasm or dissection at the end of the procedure. No significant filling with hypervascular lesion at the end of the procedure. Right hepatic arteriography: 2 main branches of the right hepatic artery. Both of these main branches were selected and injected. The more central branch was supplying the hypervascular  lesion. Drug-eluting beads were injected into the feeding branch until there was near stasis in this vessel. The drug-eluting beads were injected very slowly with multiple periods of waiting between injections to ensure adequate flow to the tumor. IMPRESSION: 1. Two hypervascular hepatic lesions were identified with angiography. Angiographic findings correspond with the hypervascular lesions seen on the recent MRI. 2. Hypervascular lesions were being supplied by both left and right hepatic arteries. The lesion being supplied by the right hepatic artery was successfully treated with chemoembolization using drug-eluting bead. Approximately 25% of the dose was given into this hypervascular lesion. The lesion being supplied by the left hepatic lobe was incompletely treated because there was marked spasm and possible dissection involving the feeding vessel. Unable to give a sufficient quantity of drug-eluting beads to the lesion from the left hepatic artery due to poor inflow. Electronically Signed   By: Markus Daft M.D.   On: 04/05/2019 08:48   Ir Angiogram Selective Each Additional Vessel  Result Date: 04/05/2019 INDICATION: 73 year old with hepatocellular carcinoma. Patient has had multiple liver directed therapies including chemoembolization and multiple microwave ablation. Most recent imaging demonstrates 2 suspicious lesions around previous ablation sites near segment 8 of the liver. Patient presents for chemoembolization. EXAM: 1. VISCERAL ANGIOGRAPHY INCLUDING CELIAC ARTERY, COMMON  HEPATIC ARTERY, LEFT HEPATIC ARTERY, LEFT HEPATIC ARTERY BRANCHES, RIGHT HEPATIC ARTERY AND RIGHT HEPATIC ARTERY BRANCHES 2. CHEMOEMBOLIZATION WITH DRUG-ELUTING BEADS IN LEFT AND RIGHT HEPATIC ARTERY BRANCHES 3. ULTRASOUND GUIDANCE FOR VASCULAR ACCESS MEDICATIONS: Zosyn 3.375 g. The antibiotic was administered within 1 hour of the procedure. Decadron 8 mg. DEB-TACE: 75 mg doxorubicin and 100-300 micron LC beads combined with 20 mL  of Omnipaque 300 ANESTHESIA/SEDATION: Moderate (conscious) sedation was employed during this procedure. A total of Versed 6.0 mg and Fentanyl 100 mcg was administered intravenously. Moderate Sedation Time: 2 hours and 40 minutes. The patient's level of consciousness and vital signs were monitored continuously by radiology nursing throughout the procedure under my direct supervision. CONTRAST:  193 mL Omnipaque 300 FLUOROSCOPY TIME:  Fluoroscopy Time: 39 minutes, 12 seconds, 7654 mGy COMPLICATIONS: None immediate. PROCEDURE: The procedure was explained to the patient. The risks and benefits of the procedure were discussed and the patient's questions were addressed. Informed consent was obtained from the patient. Patient was placed supine on the interventional table. Ultrasound demonstrated a patent right common femoral artery. Ultrasound image was saved for documentation. The right groin was prepped and draped in sterile fashion. Maximal barrier sterile technique was utilized including caps, mask, sterile gowns, sterile gloves, sterile drape, hand hygiene and skin antiseptic. Right groin was anesthetized with 1% lidocaine. Small incision was made. Using ultrasound guidance, 21 gauge needle was directed into the right common femoral artery and micropuncture dilator set was placed. 5 [place] vascular sheath was placed. C2 catheter was used to cannulate the celiac artery. Celiac arteriography was performed. C2 catheter was advanced into the common hepatic artery using a Glidewire. Angiographic run was performed in the common hepatic artery. The left hepatic artery and medial segment branches were selected with a Progreat microcatheter. Selective angiography in the left hepatic artery was performed. Due to spasm of the branch supplying the hepatic tumor, the microcatheter was pulled out of the left hepatic artery and used to cannulate the right hepatic artery. Selective right hepatic arteriography was performed. Central  or medial branch of the right hepatic lobe was found to be supplying a hypervascular lesion in the central aspect of the liver. This branch was selected and additional angiography was performed. Chemoembolization was performed in this branch with the drug-eluting beads. The drug-eluting beads were administered very slowly until there was near stasis in the branches supplying the tumor. Multiple follow-up angiograms were obtained. Attention was directed back to the left hepatic artery. Medial branch supplying the hypervascular lesion was targeted again. Unfortunately, the primary vessel supplying the hypervascular lesion was either dissected or spasmed. 200 mcg of nitroglycerin was given in the left hepatic artery branch. After administration of nitroglycerin, the catheter was advanced into the vessel supplying the hypervascular tumor. Drug-eluting beads was administrated but there was poor flow and only a very small quantity of the beads could be administered. Follow-up angiograms were obtained in left hepatic lobe. Microcatheter and 5 French catheter removed. Final angiogram was performed through the right groin sheath. The right groin sheath was removed using an ExoSeal closure device. Hemostasis at the right groin at the end of the procedure. FINDINGS: Celiac trunk is patent with flow in the splenic artery, left gastric artery and common hepatic artery. Incidentally there appears to be replaced left hepatic artery coming off the left gastric artery. GDA is widely patent. Portal venous system is patent on the delayed images. Common hepatic artery injection: There are 2 hypervascular lesions in the central aspect  of the liver corresponding with the areas of concern on the recent MRI. Irregularity along the periphery of the right hepatic lobe likely related to previous ablation sites. Near the end of the procedure, repeat common hepatic arteriogram was performed demonstrating very slow flow to the treated branch off  the right hepatic lobe. There is minimal blushing from the branch off the left hepatic lobe but there was either spasm or occlusion of the feeding branch in the left hepatic artery. Selective left hepatic arteriography: Branch of the left hepatic lobe was selected and there was arterial blushing to the tumor. Shortly after the targeted vessel was selected, there was marked spasm within the feeding vessel and there was no longer significant flow to the lesion. Due to spasm, the catheter was pulled out of the left hepatic artery and attention was directed to the right hepatic artery. Attention was directed back to the left hepatic branch supplying the hypervascular lesion at the end of the procedure. Again there was poor flow to the hypervascular lesion due to spasm or possible dissection involving the feeding vessel. Following administration of 200 mcg of nitroglycerin, catheter was able to be advanced into the feeding vessel but there was poor flow. Minimal amount of the drug-eluting beads is able to be injected before there was stagnant flow. Feeding vessel was essentially occluded due to spasm or dissection at the end of the procedure. No significant filling with hypervascular lesion at the end of the procedure. Right hepatic arteriography: 2 main branches of the right hepatic artery. Both of these main branches were selected and injected. The more central branch was supplying the hypervascular lesion. Drug-eluting beads were injected into the feeding branch until there was near stasis in this vessel. The drug-eluting beads were injected very slowly with multiple periods of waiting between injections to ensure adequate flow to the tumor. IMPRESSION: 1. Two hypervascular hepatic lesions were identified with angiography. Angiographic findings correspond with the hypervascular lesions seen on the recent MRI. 2. Hypervascular lesions were being supplied by both left and right hepatic arteries. The lesion being  supplied by the right hepatic artery was successfully treated with chemoembolization using drug-eluting bead. Approximately 25% of the dose was given into this hypervascular lesion. The lesion being supplied by the left hepatic lobe was incompletely treated because there was marked spasm and possible dissection involving the feeding vessel. Unable to give a sufficient quantity of drug-eluting beads to the lesion from the left hepatic artery due to poor inflow. Electronically Signed   By: Markus Daft M.D.   On: 04/05/2019 08:48   Ir Angiogram Selective Each Additional Vessel  Result Date: 04/05/2019 INDICATION: 73 year old with hepatocellular carcinoma. Patient has had multiple liver directed therapies including chemoembolization and multiple microwave ablation. Most recent imaging demonstrates 2 suspicious lesions around previous ablation sites near segment 8 of the liver. Patient presents for chemoembolization. EXAM: 1. VISCERAL ANGIOGRAPHY INCLUDING CELIAC ARTERY, COMMON HEPATIC ARTERY, LEFT HEPATIC ARTERY, LEFT HEPATIC ARTERY BRANCHES, RIGHT HEPATIC ARTERY AND RIGHT HEPATIC ARTERY BRANCHES 2. CHEMOEMBOLIZATION WITH DRUG-ELUTING BEADS IN LEFT AND RIGHT HEPATIC ARTERY BRANCHES 3. ULTRASOUND GUIDANCE FOR VASCULAR ACCESS MEDICATIONS: Zosyn 3.375 g. The antibiotic was administered within 1 hour of the procedure. Decadron 8 mg. DEB-TACE: 75 mg doxorubicin and 100-300 micron LC beads combined with 20 mL of Omnipaque 300 ANESTHESIA/SEDATION: Moderate (conscious) sedation was employed during this procedure. A total of Versed 6.0 mg and Fentanyl 100 mcg was administered intravenously. Moderate Sedation Time: 2 hours and 40 minutes. The  patient's level of consciousness and vital signs were monitored continuously by radiology nursing throughout the procedure under my direct supervision. CONTRAST:  193 mL Omnipaque 300 FLUOROSCOPY TIME:  Fluoroscopy Time: 39 minutes, 12 seconds, 4332 mGy COMPLICATIONS: None immediate.  PROCEDURE: The procedure was explained to the patient. The risks and benefits of the procedure were discussed and the patient's questions were addressed. Informed consent was obtained from the patient. Patient was placed supine on the interventional table. Ultrasound demonstrated a patent right common femoral artery. Ultrasound image was saved for documentation. The right groin was prepped and draped in sterile fashion. Maximal barrier sterile technique was utilized including caps, mask, sterile gowns, sterile gloves, sterile drape, hand hygiene and skin antiseptic. Right groin was anesthetized with 1% lidocaine. Small incision was made. Using ultrasound guidance, 21 gauge needle was directed into the right common femoral artery and micropuncture dilator set was placed. 5 [place] vascular sheath was placed. C2 catheter was used to cannulate the celiac artery. Celiac arteriography was performed. C2 catheter was advanced into the common hepatic artery using a Glidewire. Angiographic run was performed in the common hepatic artery. The left hepatic artery and medial segment branches were selected with a Progreat microcatheter. Selective angiography in the left hepatic artery was performed. Due to spasm of the branch supplying the hepatic tumor, the microcatheter was pulled out of the left hepatic artery and used to cannulate the right hepatic artery. Selective right hepatic arteriography was performed. Central or medial branch of the right hepatic lobe was found to be supplying a hypervascular lesion in the central aspect of the liver. This branch was selected and additional angiography was performed. Chemoembolization was performed in this branch with the drug-eluting beads. The drug-eluting beads were administered very slowly until there was near stasis in the branches supplying the tumor. Multiple follow-up angiograms were obtained. Attention was directed back to the left hepatic artery. Medial branch supplying the  hypervascular lesion was targeted again. Unfortunately, the primary vessel supplying the hypervascular lesion was either dissected or spasmed. 200 mcg of nitroglycerin was given in the left hepatic artery branch. After administration of nitroglycerin, the catheter was advanced into the vessel supplying the hypervascular tumor. Drug-eluting beads was administrated but there was poor flow and only a very small quantity of the beads could be administered. Follow-up angiograms were obtained in left hepatic lobe. Microcatheter and 5 French catheter removed. Final angiogram was performed through the right groin sheath. The right groin sheath was removed using an ExoSeal closure device. Hemostasis at the right groin at the end of the procedure. FINDINGS: Celiac trunk is patent with flow in the splenic artery, left gastric artery and common hepatic artery. Incidentally there appears to be replaced left hepatic artery coming off the left gastric artery. GDA is widely patent. Portal venous system is patent on the delayed images. Common hepatic artery injection: There are 2 hypervascular lesions in the central aspect of the liver corresponding with the areas of concern on the recent MRI. Irregularity along the periphery of the right hepatic lobe likely related to previous ablation sites. Near the end of the procedure, repeat common hepatic arteriogram was performed demonstrating very slow flow to the treated branch off the right hepatic lobe. There is minimal blushing from the branch off the left hepatic lobe but there was either spasm or occlusion of the feeding branch in the left hepatic artery. Selective left hepatic arteriography: Branch of the left hepatic lobe was selected and there was arterial blushing to  the tumor. Shortly after the targeted vessel was selected, there was marked spasm within the feeding vessel and there was no longer significant flow to the lesion. Due to spasm, the catheter was pulled out of the left  hepatic artery and attention was directed to the right hepatic artery. Attention was directed back to the left hepatic branch supplying the hypervascular lesion at the end of the procedure. Again there was poor flow to the hypervascular lesion due to spasm or possible dissection involving the feeding vessel. Following administration of 200 mcg of nitroglycerin, catheter was able to be advanced into the feeding vessel but there was poor flow. Minimal amount of the drug-eluting beads is able to be injected before there was stagnant flow. Feeding vessel was essentially occluded due to spasm or dissection at the end of the procedure. No significant filling with hypervascular lesion at the end of the procedure. Right hepatic arteriography: 2 main branches of the right hepatic artery. Both of these main branches were selected and injected. The more central branch was supplying the hypervascular lesion. Drug-eluting beads were injected into the feeding branch until there was near stasis in this vessel. The drug-eluting beads were injected very slowly with multiple periods of waiting between injections to ensure adequate flow to the tumor. IMPRESSION: 1. Two hypervascular hepatic lesions were identified with angiography. Angiographic findings correspond with the hypervascular lesions seen on the recent MRI. 2. Hypervascular lesions were being supplied by both left and right hepatic arteries. The lesion being supplied by the right hepatic artery was successfully treated with chemoembolization using drug-eluting bead. Approximately 25% of the dose was given into this hypervascular lesion. The lesion being supplied by the left hepatic lobe was incompletely treated because there was marked spasm and possible dissection involving the feeding vessel. Unable to give a sufficient quantity of drug-eluting beads to the lesion from the left hepatic artery due to poor inflow. Electronically Signed   By: Markus Daft M.D.   On: 04/05/2019  08:48   Ir Angiogram Selective Each Additional Vessel  Result Date: 04/05/2019 INDICATION: 73 year old with hepatocellular carcinoma. Patient has had multiple liver directed therapies including chemoembolization and multiple microwave ablation. Most recent imaging demonstrates 2 suspicious lesions around previous ablation sites near segment 8 of the liver. Patient presents for chemoembolization. EXAM: 1. VISCERAL ANGIOGRAPHY INCLUDING CELIAC ARTERY, COMMON HEPATIC ARTERY, LEFT HEPATIC ARTERY, LEFT HEPATIC ARTERY BRANCHES, RIGHT HEPATIC ARTERY AND RIGHT HEPATIC ARTERY BRANCHES 2. CHEMOEMBOLIZATION WITH DRUG-ELUTING BEADS IN LEFT AND RIGHT HEPATIC ARTERY BRANCHES 3. ULTRASOUND GUIDANCE FOR VASCULAR ACCESS MEDICATIONS: Zosyn 3.375 g. The antibiotic was administered within 1 hour of the procedure. Decadron 8 mg. DEB-TACE: 75 mg doxorubicin and 100-300 micron LC beads combined with 20 mL of Omnipaque 300 ANESTHESIA/SEDATION: Moderate (conscious) sedation was employed during this procedure. A total of Versed 6.0 mg and Fentanyl 100 mcg was administered intravenously. Moderate Sedation Time: 2 hours and 40 minutes. The patient's level of consciousness and vital signs were monitored continuously by radiology nursing throughout the procedure under my direct supervision. CONTRAST:  193 mL Omnipaque 300 FLUOROSCOPY TIME:  Fluoroscopy Time: 39 minutes, 12 seconds, 5038 mGy COMPLICATIONS: None immediate. PROCEDURE: The procedure was explained to the patient. The risks and benefits of the procedure were discussed and the patient's questions were addressed. Informed consent was obtained from the patient. Patient was placed supine on the interventional table. Ultrasound demonstrated a patent right common femoral artery. Ultrasound image was saved for documentation. The right groin was prepped and draped  in sterile fashion. Maximal barrier sterile technique was utilized including caps, mask, sterile gowns, sterile gloves,  sterile drape, hand hygiene and skin antiseptic. Right groin was anesthetized with 1% lidocaine. Small incision was made. Using ultrasound guidance, 21 gauge needle was directed into the right common femoral artery and micropuncture dilator set was placed. 5 [place] vascular sheath was placed. C2 catheter was used to cannulate the celiac artery. Celiac arteriography was performed. C2 catheter was advanced into the common hepatic artery using a Glidewire. Angiographic run was performed in the common hepatic artery. The left hepatic artery and medial segment branches were selected with a Progreat microcatheter. Selective angiography in the left hepatic artery was performed. Due to spasm of the branch supplying the hepatic tumor, the microcatheter was pulled out of the left hepatic artery and used to cannulate the right hepatic artery. Selective right hepatic arteriography was performed. Central or medial branch of the right hepatic lobe was found to be supplying a hypervascular lesion in the central aspect of the liver. This branch was selected and additional angiography was performed. Chemoembolization was performed in this branch with the drug-eluting beads. The drug-eluting beads were administered very slowly until there was near stasis in the branches supplying the tumor. Multiple follow-up angiograms were obtained. Attention was directed back to the left hepatic artery. Medial branch supplying the hypervascular lesion was targeted again. Unfortunately, the primary vessel supplying the hypervascular lesion was either dissected or spasmed. 200 mcg of nitroglycerin was given in the left hepatic artery branch. After administration of nitroglycerin, the catheter was advanced into the vessel supplying the hypervascular tumor. Drug-eluting beads was administrated but there was poor flow and only a very small quantity of the beads could be administered. Follow-up angiograms were obtained in left hepatic lobe.  Microcatheter and 5 French catheter removed. Final angiogram was performed through the right groin sheath. The right groin sheath was removed using an ExoSeal closure device. Hemostasis at the right groin at the end of the procedure. FINDINGS: Celiac trunk is patent with flow in the splenic artery, left gastric artery and common hepatic artery. Incidentally there appears to be replaced left hepatic artery coming off the left gastric artery. GDA is widely patent. Portal venous system is patent on the delayed images. Common hepatic artery injection: There are 2 hypervascular lesions in the central aspect of the liver corresponding with the areas of concern on the recent MRI. Irregularity along the periphery of the right hepatic lobe likely related to previous ablation sites. Near the end of the procedure, repeat common hepatic arteriogram was performed demonstrating very slow flow to the treated branch off the right hepatic lobe. There is minimal blushing from the branch off the left hepatic lobe but there was either spasm or occlusion of the feeding branch in the left hepatic artery. Selective left hepatic arteriography: Branch of the left hepatic lobe was selected and there was arterial blushing to the tumor. Shortly after the targeted vessel was selected, there was marked spasm within the feeding vessel and there was no longer significant flow to the lesion. Due to spasm, the catheter was pulled out of the left hepatic artery and attention was directed to the right hepatic artery. Attention was directed back to the left hepatic branch supplying the hypervascular lesion at the end of the procedure. Again there was poor flow to the hypervascular lesion due to spasm or possible dissection involving the feeding vessel. Following administration of 200 mcg of nitroglycerin, catheter was able to be  advanced into the feeding vessel but there was poor flow. Minimal amount of the drug-eluting beads is able to be injected  before there was stagnant flow. Feeding vessel was essentially occluded due to spasm or dissection at the end of the procedure. No significant filling with hypervascular lesion at the end of the procedure. Right hepatic arteriography: 2 main branches of the right hepatic artery. Both of these main branches were selected and injected. The more central branch was supplying the hypervascular lesion. Drug-eluting beads were injected into the feeding branch until there was near stasis in this vessel. The drug-eluting beads were injected very slowly with multiple periods of waiting between injections to ensure adequate flow to the tumor. IMPRESSION: 1. Two hypervascular hepatic lesions were identified with angiography. Angiographic findings correspond with the hypervascular lesions seen on the recent MRI. 2. Hypervascular lesions were being supplied by both left and right hepatic arteries. The lesion being supplied by the right hepatic artery was successfully treated with chemoembolization using drug-eluting bead. Approximately 25% of the dose was given into this hypervascular lesion. The lesion being supplied by the left hepatic lobe was incompletely treated because there was marked spasm and possible dissection involving the feeding vessel. Unable to give a sufficient quantity of drug-eluting beads to the lesion from the left hepatic artery due to poor inflow. Electronically Signed   By: Markus Daft M.D.   On: 04/05/2019 08:48   Ir US Guide Vasc Access Right  Result Date: 04/05/2019 INDICATION: 73 year old with hepatocellular carcinoma. Patient has had multiple liver directed therapies including chemoembolization and multiple microwave ablation. Most recent imaging demonstrates 2 suspicious lesions around previous ablation sites near segment 8 of the liver. Patient presents for chemoembolization. EXAM: 1. VISCERAL ANGIOGRAPHY INCLUDING CELIAC ARTERY, COMMON HEPATIC ARTERY, LEFT HEPATIC ARTERY, LEFT HEPATIC ARTERY  BRANCHES, RIGHT HEPATIC ARTERY AND RIGHT HEPATIC ARTERY BRANCHES 2. CHEMOEMBOLIZATION WITH DRUG-ELUTING BEADS IN LEFT AND RIGHT HEPATIC ARTERY BRANCHES 3. ULTRASOUND GUIDANCE FOR VASCULAR ACCESS MEDICATIONS: Zosyn 3.375 g. The antibiotic was administered within 1 hour of the procedure. Decadron 8 mg. DEB-TACE: 75 mg doxorubicin and 100-300 micron LC beads combined with 20 mL of Omnipaque 300 ANESTHESIA/SEDATION: Moderate (conscious) sedation was employed during this procedure. A total of Versed 6.0 mg and Fentanyl 100 mcg was administered intravenously. Moderate Sedation Time: 2 hours and 40 minutes. The patient's level of consciousness and vital signs were monitored continuously by radiology nursing throughout the procedure under my direct supervision. CONTRAST:  193 mL Omnipaque 300 FLUOROSCOPY TIME:  Fluoroscopy Time: 39 minutes, 12 seconds, 9371 mGy COMPLICATIONS: None immediate. PROCEDURE: The procedure was explained to the patient. The risks and benefits of the procedure were discussed and the patient's questions were addressed. Informed consent was obtained from the patient. Patient was placed supine on the interventional table. Ultrasound demonstrated a patent right common femoral artery. Ultrasound image was saved for documentation. The right groin was prepped and draped in sterile fashion. Maximal barrier sterile technique was utilized including caps, mask, sterile gowns, sterile gloves, sterile drape, hand hygiene and skin antiseptic. Right groin was anesthetized with 1% lidocaine. Small incision was made. Using ultrasound guidance, 21 gauge needle was directed into the right common femoral artery and micropuncture dilator set was placed. 5 [place] vascular sheath was placed. C2 catheter was used to cannulate the celiac artery. Celiac arteriography was performed. C2 catheter was advanced into the common hepatic artery using a Glidewire. Angiographic run was performed in the common hepatic artery. The left  hepatic artery  and medial segment branches were selected with a Progreat microcatheter. Selective angiography in the left hepatic artery was performed. Due to spasm of the branch supplying the hepatic tumor, the microcatheter was pulled out of the left hepatic artery and used to cannulate the right hepatic artery. Selective right hepatic arteriography was performed. Central or medial branch of the right hepatic lobe was found to be supplying a hypervascular lesion in the central aspect of the liver. This branch was selected and additional angiography was performed. Chemoembolization was performed in this branch with the drug-eluting beads. The drug-eluting beads were administered very slowly until there was near stasis in the branches supplying the tumor. Multiple follow-up angiograms were obtained. Attention was directed back to the left hepatic artery. Medial branch supplying the hypervascular lesion was targeted again. Unfortunately, the primary vessel supplying the hypervascular lesion was either dissected or spasmed. 200 mcg of nitroglycerin was given in the left hepatic artery branch. After administration of nitroglycerin, the catheter was advanced into the vessel supplying the hypervascular tumor. Drug-eluting beads was administrated but there was poor flow and only a very small quantity of the beads could be administered. Follow-up angiograms were obtained in left hepatic lobe. Microcatheter and 5 French catheter removed. Final angiogram was performed through the right groin sheath. The right groin sheath was removed using an ExoSeal closure device. Hemostasis at the right groin at the end of the procedure. FINDINGS: Celiac trunk is patent with flow in the splenic artery, left gastric artery and common hepatic artery. Incidentally there appears to be replaced left hepatic artery coming off the left gastric artery. GDA is widely patent. Portal venous system is patent on the delayed images. Common hepatic  artery injection: There are 2 hypervascular lesions in the central aspect of the liver corresponding with the areas of concern on the recent MRI. Irregularity along the periphery of the right hepatic lobe likely related to previous ablation sites. Near the end of the procedure, repeat common hepatic arteriogram was performed demonstrating very slow flow to the treated branch off the right hepatic lobe. There is minimal blushing from the branch off the left hepatic lobe but there was either spasm or occlusion of the feeding branch in the left hepatic artery. Selective left hepatic arteriography: Branch of the left hepatic lobe was selected and there was arterial blushing to the tumor. Shortly after the targeted vessel was selected, there was marked spasm within the feeding vessel and there was no longer significant flow to the lesion. Due to spasm, the catheter was pulled out of the left hepatic artery and attention was directed to the right hepatic artery. Attention was directed back to the left hepatic branch supplying the hypervascular lesion at the end of the procedure. Again there was poor flow to the hypervascular lesion due to spasm or possible dissection involving the feeding vessel. Following administration of 200 mcg of nitroglycerin, catheter was able to be advanced into the feeding vessel but there was poor flow. Minimal amount of the drug-eluting beads is able to be injected before there was stagnant flow. Feeding vessel was essentially occluded due to spasm or dissection at the end of the procedure. No significant filling with hypervascular lesion at the end of the procedure. Right hepatic arteriography: 2 main branches of the right hepatic artery. Both of these main branches were selected and injected. The more central branch was supplying the hypervascular lesion. Drug-eluting beads were injected into the feeding branch until there was near stasis in this vessel.  The drug-eluting beads were injected  very slowly with multiple periods of waiting between injections to ensure adequate flow to the tumor. IMPRESSION: 1. Two hypervascular hepatic lesions were identified with angiography. Angiographic findings correspond with the hypervascular lesions seen on the recent MRI. 2. Hypervascular lesions were being supplied by both left and right hepatic arteries. The lesion being supplied by the right hepatic artery was successfully treated with chemoembolization using drug-eluting bead. Approximately 25% of the dose was given into this hypervascular lesion. The lesion being supplied by the left hepatic lobe was incompletely treated because there was marked spasm and possible dissection involving the feeding vessel. Unable to give a sufficient quantity of drug-eluting beads to the lesion from the left hepatic artery due to poor inflow. Electronically Signed   By: Markus Daft M.D.   On: 04/05/2019 08:48   Ir Embo Tumor Organ Ischemia Infarct Inc Guide Roadmapping  Result Date: 04/05/2019 INDICATION: 73 year old with hepatocellular carcinoma. Patient has had multiple liver directed therapies including chemoembolization and multiple microwave ablation. Most recent imaging demonstrates 2 suspicious lesions around previous ablation sites near segment 8 of the liver. Patient presents for chemoembolization. EXAM: 1. VISCERAL ANGIOGRAPHY INCLUDING CELIAC ARTERY, COMMON HEPATIC ARTERY, LEFT HEPATIC ARTERY, LEFT HEPATIC ARTERY BRANCHES, RIGHT HEPATIC ARTERY AND RIGHT HEPATIC ARTERY BRANCHES 2. CHEMOEMBOLIZATION WITH DRUG-ELUTING BEADS IN LEFT AND RIGHT HEPATIC ARTERY BRANCHES 3. ULTRASOUND GUIDANCE FOR VASCULAR ACCESS MEDICATIONS: Zosyn 3.375 g. The antibiotic was administered within 1 hour of the procedure. Decadron 8 mg. DEB-TACE: 75 mg doxorubicin and 100-300 micron LC beads combined with 20 mL of Omnipaque 300 ANESTHESIA/SEDATION: Moderate (conscious) sedation was employed during this procedure. A total of Versed 6.0 mg and  Fentanyl 100 mcg was administered intravenously. Moderate Sedation Time: 2 hours and 40 minutes. The patient's level of consciousness and vital signs were monitored continuously by radiology nursing throughout the procedure under my direct supervision. CONTRAST:  193 mL Omnipaque 300 FLUOROSCOPY TIME:  Fluoroscopy Time: 39 minutes, 12 seconds, 8546 mGy COMPLICATIONS: None immediate. PROCEDURE: The procedure was explained to the patient. The risks and benefits of the procedure were discussed and the patient's questions were addressed. Informed consent was obtained from the patient. Patient was placed supine on the interventional table. Ultrasound demonstrated a patent right common femoral artery. Ultrasound image was saved for documentation. The right groin was prepped and draped in sterile fashion. Maximal barrier sterile technique was utilized including caps, mask, sterile gowns, sterile gloves, sterile drape, hand hygiene and skin antiseptic. Right groin was anesthetized with 1% lidocaine. Small incision was made. Using ultrasound guidance, 21 gauge needle was directed into the right common femoral artery and micropuncture dilator set was placed. 5 [place] vascular sheath was placed. C2 catheter was used to cannulate the celiac artery. Celiac arteriography was performed. C2 catheter was advanced into the common hepatic artery using a Glidewire. Angiographic run was performed in the common hepatic artery. The left hepatic artery and medial segment branches were selected with a Progreat microcatheter. Selective angiography in the left hepatic artery was performed. Due to spasm of the branch supplying the hepatic tumor, the microcatheter was pulled out of the left hepatic artery and used to cannulate the right hepatic artery. Selective right hepatic arteriography was performed. Central or medial branch of the right hepatic lobe was found to be supplying a hypervascular lesion in the central aspect of the liver. This  branch was selected and additional angiography was performed. Chemoembolization was performed in this branch with the drug-eluting beads.  The drug-eluting beads were administered very slowly until there was near stasis in the branches supplying the tumor. Multiple follow-up angiograms were obtained. Attention was directed back to the left hepatic artery. Medial branch supplying the hypervascular lesion was targeted again. Unfortunately, the primary vessel supplying the hypervascular lesion was either dissected or spasmed. 200 mcg of nitroglycerin was given in the left hepatic artery branch. After administration of nitroglycerin, the catheter was advanced into the vessel supplying the hypervascular tumor. Drug-eluting beads was administrated but there was poor flow and only a very small quantity of the beads could be administered. Follow-up angiograms were obtained in left hepatic lobe. Microcatheter and 5 French catheter removed. Final angiogram was performed through the right groin sheath. The right groin sheath was removed using an ExoSeal closure device. Hemostasis at the right groin at the end of the procedure. FINDINGS: Celiac trunk is patent with flow in the splenic artery, left gastric artery and common hepatic artery. Incidentally there appears to be replaced left hepatic artery coming off the left gastric artery. GDA is widely patent. Portal venous system is patent on the delayed images. Common hepatic artery injection: There are 2 hypervascular lesions in the central aspect of the liver corresponding with the areas of concern on the recent MRI. Irregularity along the periphery of the right hepatic lobe likely related to previous ablation sites. Near the end of the procedure, repeat common hepatic arteriogram was performed demonstrating very slow flow to the treated branch off the right hepatic lobe. There is minimal blushing from the branch off the left hepatic lobe but there was either spasm or occlusion  of the feeding branch in the left hepatic artery. Selective left hepatic arteriography: Branch of the left hepatic lobe was selected and there was arterial blushing to the tumor. Shortly after the targeted vessel was selected, there was marked spasm within the feeding vessel and there was no longer significant flow to the lesion. Due to spasm, the catheter was pulled out of the left hepatic artery and attention was directed to the right hepatic artery. Attention was directed back to the left hepatic branch supplying the hypervascular lesion at the end of the procedure. Again there was poor flow to the hypervascular lesion due to spasm or possible dissection involving the feeding vessel. Following administration of 200 mcg of nitroglycerin, catheter was able to be advanced into the feeding vessel but there was poor flow. Minimal amount of the drug-eluting beads is able to be injected before there was stagnant flow. Feeding vessel was essentially occluded due to spasm or dissection at the end of the procedure. No significant filling with hypervascular lesion at the end of the procedure. Right hepatic arteriography: 2 main branches of the right hepatic artery. Both of these main branches were selected and injected. The more central branch was supplying the hypervascular lesion. Drug-eluting beads were injected into the feeding branch until there was near stasis in this vessel. The drug-eluting beads were injected very slowly with multiple periods of waiting between injections to ensure adequate flow to the tumor. IMPRESSION: 1. Two hypervascular hepatic lesions were identified with angiography. Angiographic findings correspond with the hypervascular lesions seen on the recent MRI. 2. Hypervascular lesions were being supplied by both left and right hepatic arteries. The lesion being supplied by the right hepatic artery was successfully treated with chemoembolization using drug-eluting bead. Approximately 25% of the dose  was given into this hypervascular lesion. The lesion being supplied by the left hepatic lobe was incompletely  treated because there was marked spasm and possible dissection involving the feeding vessel. Unable to give a sufficient quantity of drug-eluting beads to the lesion from the left hepatic artery due to poor inflow. Electronically Signed   By: Markus Daft M.D.   On: 04/05/2019 08:48   Ir Radiologist Eval & Mgmt  Result Date: 03/22/2019 Please refer to notes tab for details about interventional procedure. (Op Note)   Labs:  CBC: Recent Labs    06/21/18 0837 06/29/18 0733 03/30/19 0740 04/04/19 1235  WBC 5.2 5.3 6.1 6.7  HGB 11.7* 11.6* 11.6* 12.1*  HCT 39.7 39.8 38.4* 40.9  PLT 287 269 285 281    COAGS: Recent Labs    06/29/18 0733 03/30/19 0741 04/04/19 1235  INR 0.96 0.9 0.9  APTT 31  --   --     BMP: Recent Labs    06/29/18 0733 03/14/19 1230 03/30/19 0740 04/04/19 1235 04/05/19 0349  NA 139  --  138 139 136  K 4.1  --  4.8 4.2 4.8  CL 99  --  102 100 104  CO2 28  --  22 27 23   GLUCOSE 140*  --  166* 150* 213*  BUN 23  --  21 20 25*  CALCIUM 10.0  --  9.8 10.0 8.7*  CREATININE 1.48* 1.58* 1.56* 1.40* 1.48*  GFRNONAA 46* 43* 44* 50* 47*  GFRAA 53* 50* 51* 58* 54*    LIVER FUNCTION TESTS: Recent Labs    06/29/18 0733 03/30/19 0740 04/04/19 1235 04/05/19 0349  BILITOT 0.6 0.4 0.6 0.5  AST 24 21 21 22   ALT 20 19 21 19   ALKPHOS 38 46 47 38  PROT 8.3* 7.7 8.8* 6.9  ALBUMIN 4.6 4.1 4.6 3.4*    TUMOR MARKERS: No results for input(s): AFPTM, CEA, CA199, CHROMGRNA in the last 8760 hours.  Assessment and Plan:  73 year old with recurrent hepatocellular carcinoma.  Two hypervascular lesions were recently treated with DEB-TACE.  Unfortunately, one lesion was not adequately treated due to spasm or dissection involving the feeding vessel.  I explained the technical difficulties with the patient and told him that I would like to try to treat this lesion  again in a few weeks after the vessel has had time to heal.  Patient tolerated the chemoembolization well and I think that he he could tolerate the procedure again without overnight observation.  Based on the small size of the lesions and the small quantity of particles that were injected at the recent treatment, I would just plan on performing bland embolization to the lesions.    We will schedule a bland embolization of the liver lesions in approximately 3 weeks.  Will try to schedule the procedure for the morning so that patient can be observed throughout the day and hopefully avoid overnight admission.  Thank you for this interesting consult.  I greatly enjoyed meeting Aaron [name] and look forward to participating in their care.  A copy of this report was sent to the requesting provider on this date.  Electronically Signed: Burman Riis 04/11/2019, 11:31 AM   I spent a total of    10 Minutes in remote  clinical consultation, greater than 50% of which was counseling/coordinating care for hepatocellular carcinoma.    Visit type: Audio only (telephone). Audio (no video) only due to patient's lack of internet/smartphone capability. Alternative for in-person consultation at [facility], [address], [place], [place]. This visit type was conducted due to national recommendations for  restrictions regarding the COVID-19 Pandemic (e.g. social distancing).  This format is felt to be most appropriate for this patient at this time.  All issues noted in this document were discussed and addressed.  Patient ID: Aaron [name], male   DOB: 04-27-1946, 73 y.o.   MRN: [id number]

## 2019-04-17 ENCOUNTER — Other Ambulatory Visit (HOSPITAL_COMMUNITY): Payer: Self-pay | Admitting: Diagnostic Radiology

## 2019-04-17 DIAGNOSIS — C220 Liver cell carcinoma: Secondary | ICD-10-CM

## 2019-04-18 ENCOUNTER — Other Ambulatory Visit: Payer: Self-pay

## 2019-04-18 ENCOUNTER — Ambulatory Visit (HOSPITAL_COMMUNITY)
Admission: RE | Admit: 2019-04-18 | Discharge: 2019-04-18 | Disposition: A | Discharge status: Home / Self Care | Payer: Medicare HMO | Source: Ambulatory Visit | Attending: Hematology | Admitting: Hematology

## 2019-04-18 ENCOUNTER — Encounter (HOSPITAL_COMMUNITY): Payer: Self-pay

## 2019-04-18 DIAGNOSIS — C787 Secondary malignant neoplasm of liver and intrahepatic bile duct: Secondary | ICD-10-CM | POA: Diagnosis not present

## 2019-04-18 DIAGNOSIS — C229 Malignant neoplasm of liver, not specified as primary or secondary: Secondary | ICD-10-CM | POA: Diagnosis not present

## 2019-05-15 ENCOUNTER — Other Ambulatory Visit: Payer: Self-pay | Admitting: Radiology

## 2019-05-16 ENCOUNTER — Encounter (HOSPITAL_COMMUNITY): Payer: Self-pay

## 2019-05-16 ENCOUNTER — Other Ambulatory Visit: Payer: Self-pay

## 2019-05-16 ENCOUNTER — Ambulatory Visit (HOSPITAL_COMMUNITY)
Admission: RE | Admit: 2019-05-16 | Discharge: 2019-05-16 | Disposition: A | Discharge status: Home / Self Care | Payer: Medicare HMO | Source: Ambulatory Visit | Attending: Diagnostic Radiology | Admitting: Diagnostic Radiology

## 2019-05-16 ENCOUNTER — Other Ambulatory Visit (HOSPITAL_COMMUNITY): Payer: Self-pay | Admitting: Diagnostic Radiology

## 2019-05-16 DIAGNOSIS — Z87891 Personal history of nicotine dependence: Secondary | ICD-10-CM | POA: Insufficient documentation

## 2019-05-16 DIAGNOSIS — M1990 Unspecified osteoarthritis, unspecified site: Secondary | ICD-10-CM | POA: Diagnosis not present

## 2019-05-16 DIAGNOSIS — I10 Essential (primary) hypertension: Secondary | ICD-10-CM | POA: Insufficient documentation

## 2019-05-16 DIAGNOSIS — Z7982 Long term (current) use of aspirin: Secondary | ICD-10-CM | POA: Insufficient documentation

## 2019-05-16 DIAGNOSIS — E785 Hyperlipidemia, unspecified: Secondary | ICD-10-CM | POA: Insufficient documentation

## 2019-05-16 DIAGNOSIS — C220 Liver cell carcinoma: Secondary | ICD-10-CM

## 2019-05-16 DIAGNOSIS — E119 Type 2 diabetes mellitus without complications: Secondary | ICD-10-CM | POA: Insufficient documentation

## 2019-05-16 DIAGNOSIS — D509 Iron deficiency anemia, unspecified: Secondary | ICD-10-CM | POA: Diagnosis not present

## 2019-05-16 DIAGNOSIS — Z794 Long term (current) use of insulin: Secondary | ICD-10-CM | POA: Diagnosis not present

## 2019-05-16 DIAGNOSIS — Z79899 Other long term (current) drug therapy: Secondary | ICD-10-CM | POA: Diagnosis not present

## 2019-05-16 HISTORY — PX: IR EMBO TUMOR ORGAN ISCHEMIA INFARCT INC GUIDE ROADMAPPING: IMG5449

## 2019-05-16 HISTORY — PX: IR ANGIOGRAM VISCERAL SELECTIVE: IMG657

## 2019-05-16 HISTORY — PX: IR ANGIOGRAM SELECTIVE EACH ADDITIONAL VESSEL: IMG667

## 2019-05-16 HISTORY — PX: IR US GUIDE VASC ACCESS LEFT: IMG2389

## 2019-05-16 LAB — CBC WITH DIFFERENTIAL/PLATELET
Abs Immature Granulocytes: 0.02 10*3/uL (ref 0.00–0.07)
Basophils Absolute: 0 10*3/uL (ref 0.0–0.1)
Basophils Relative: 1 %
Eosinophils Absolute: 0.2 10*3/uL (ref 0.0–0.5)
Eosinophils Relative: 3 %
HCT: 39.3 % (ref 39.0–52.0)
Hemoglobin: 11.5 g/dL — ABNORMAL LOW (ref 13.0–17.0)
Immature Granulocytes: 0 %
Lymphocytes Relative: 32 %
Lymphs Abs: 1.6 10*3/uL (ref 0.7–4.0)
MCH: 21.8 pg — ABNORMAL LOW (ref 26.0–34.0)
MCHC: 29.3 g/dL — ABNORMAL LOW (ref 30.0–36.0)
MCV: 74.4 fL — ABNORMAL LOW (ref 80.0–100.0)
Monocytes Absolute: 0.5 10*3/uL (ref 0.1–1.0)
Monocytes Relative: 9 %
Neutro Abs: 2.7 10*3/uL (ref 1.7–7.7)
Neutrophils Relative %: 55 %
Platelets: 278 10*3/uL (ref 150–400)
RBC: 5.28 MIL/uL (ref 4.22–5.81)
RDW: 15.3 % (ref 11.5–15.5)
WBC: 5 10*3/uL (ref 4.0–10.5)
nRBC: 0 % (ref 0.0–0.2)

## 2019-05-16 LAB — COMPREHENSIVE METABOLIC PANEL
ALT: 22 U/L (ref 0–44)
AST: 29 U/L (ref 15–41)
Albumin: 4.5 g/dL (ref 3.5–5.0)
Alkaline Phosphatase: 49 U/L (ref 38–126)
Anion gap: 12 (ref 5–15)
BUN: 25 mg/dL — ABNORMAL HIGH (ref 8–23)
CO2: 25 mmol/L (ref 22–32)
Calcium: 9.4 mg/dL (ref 8.9–10.3)
Chloride: 100 mmol/L (ref 98–111)
Creatinine, Ser: 1.44 mg/dL — ABNORMAL HIGH (ref 0.61–1.24)
GFR calc Af Amer: 56 mL/min — ABNORMAL LOW (ref >60)
GFR calc non Af Amer: 48 mL/min — ABNORMAL LOW (ref >60)
Glucose, Bld: 178 mg/dL — ABNORMAL HIGH (ref 70–99)
Potassium: 4 mmol/L (ref 3.5–5.1)
Sodium: 137 mmol/L (ref 135–145)
Total Bilirubin: 0.6 mg/dL (ref 0.3–1.2)
Total Protein: 8.5 g/dL — ABNORMAL HIGH (ref 6.5–8.1)

## 2019-05-16 LAB — PROTIME-INR
INR: 1 (ref 0.8–1.2)
Prothrombin Time: 13 seconds (ref 11.4–15.2)

## 2019-05-16 LAB — GLUCOSE, CAPILLARY: Glucose-Capillary: 158 mg/dL — ABNORMAL HIGH (ref 70–99)

## 2019-05-16 MED ORDER — HEPARIN SODIUM (PORCINE) 1000 UNIT/ML IJ SOLN
INTRAMUSCULAR | Status: AC
  Filled 2019-05-16: qty 1

## 2019-05-16 MED ORDER — DEXAMETHASONE SODIUM PHOSPHATE 10 MG/ML IJ SOLN
8.0000 mg | Freq: Once | INTRAMUSCULAR | Status: AC
  Administered 2019-05-16: 8 mg via INTRAVENOUS
  Filled 2019-05-16: qty 1

## 2019-05-16 MED ORDER — VERAPAMIL HCL 2.5 MG/ML IV SOLN
INTRA_ARTERIAL | Status: AC | PRN
  Administered 2019-05-16: 10:00:00 via INTRA_ARTERIAL

## 2019-05-16 MED ORDER — NITROGLYCERIN IN D5W 100-5 MCG/ML-% IV SOLN
INTRAVENOUS | Status: AC
  Filled 2019-05-16: qty 250

## 2019-05-16 MED ORDER — LIDOCAINE HCL 1 % IJ SOLN
INTRAMUSCULAR | Status: AC
  Filled 2019-05-16: qty 20

## 2019-05-16 MED ORDER — ONDANSETRON HCL 4 MG/2ML IJ SOLN: 4.0000 mg | Freq: Four times a day (QID) | INTRAMUSCULAR | Status: DC | PRN

## 2019-05-16 MED ORDER — OXYCODONE HCL 5 MG PO TABS: 5.0000 mg | ORAL_TABLET | ORAL | Status: DC | PRN

## 2019-05-16 MED ORDER — MIDAZOLAM HCL 2 MG/2ML IJ SOLN
INTRAMUSCULAR | Status: AC
  Filled 2019-05-16: qty 6

## 2019-05-16 MED ORDER — SODIUM CHLORIDE 0.9 % IV SOLN: INTRAVENOUS | Status: DC

## 2019-05-16 MED ORDER — PIPERACILLIN-TAZOBACTAM 3.375 G IVPB
INTRAVENOUS | Status: AC
  Administered 2019-05-16: 10:00:00 3.375 g via INTRAVENOUS
  Filled 2019-05-16: qty 50

## 2019-05-16 MED ORDER — SODIUM CHLORIDE 0.9 % IV SOLN: 250.0000 mL | INTRAVENOUS | Status: DC | PRN

## 2019-05-16 MED ORDER — LIDOCAINE HCL (PF) 1 % IJ SOLN
INTRAMUSCULAR | Status: AC | PRN
  Administered 2019-05-16: 5 mL

## 2019-05-16 MED ORDER — IOHEXOL 300 MG/ML  SOLN
100.0000 mL | Freq: Once | INTRAMUSCULAR | Status: AC | PRN
  Administered 2019-05-16: 64 mL via INTRA_ARTERIAL

## 2019-05-16 MED ORDER — SODIUM CHLORIDE 0.9% FLUSH: 3.0000 mL | INTRAVENOUS | Status: DC | PRN

## 2019-05-16 MED ORDER — VERAPAMIL HCL 2.5 MG/ML IV SOLN
INTRA_ARTERIAL | Status: AC | PRN
  Administered 2019-05-16: 12:00:00 via INTRA_ARTERIAL

## 2019-05-16 MED ORDER — IOHEXOL 300 MG/ML  SOLN
100.0000 mL | Freq: Once | INTRAMUSCULAR | Status: AC | PRN
  Administered 2019-05-16: 12:00:00 8 mL via INTRA_ARTERIAL

## 2019-05-16 MED ORDER — VERAPAMIL HCL 2.5 MG/ML IV SOLN
INTRAVENOUS | Status: AC
  Filled 2019-05-16: qty 2

## 2019-05-16 MED ORDER — PIPERACILLIN-TAZOBACTAM 3.375 G IVPB
3.3750 g | Freq: Once | INTRAVENOUS | Status: AC
  Administered 2019-05-16: 10:00:00 3.375 g via INTRAVENOUS

## 2019-05-16 MED ORDER — MIDAZOLAM HCL 2 MG/2ML IJ SOLN
INTRAMUSCULAR | Status: AC | PRN
  Administered 2019-05-16 (×4): 1 mg via INTRAVENOUS

## 2019-05-16 MED ORDER — FENTANYL CITRATE (PF) 100 MCG/2ML IJ SOLN
INTRAMUSCULAR | Status: AC
  Filled 2019-05-16: qty 4

## 2019-05-16 MED ORDER — SODIUM CHLORIDE 0.9% FLUSH: 3.0000 mL | Freq: Two times a day (BID) | INTRAVENOUS | Status: DC

## 2019-05-16 MED ORDER — FENTANYL CITRATE (PF) 100 MCG/2ML IJ SOLN
INTRAMUSCULAR | Status: AC | PRN
  Administered 2019-05-16 (×2): 50 ug via INTRAVENOUS

## 2019-05-16 MED ORDER — IOHEXOL 300 MG/ML  SOLN
100.0000 mL | Freq: Once | INTRAMUSCULAR | Status: AC | PRN
  Administered 2019-05-16: 3 mL via INTRA_ARTERIAL

## 2019-05-16 MED ORDER — HEPARIN SODIUM (PORCINE) 1000 UNIT/ML IJ SOLN
INTRAMUSCULAR | Status: AC | PRN
  Administered 2019-05-16: 2000 [IU] via INTRAVENOUS

## 2019-05-16 MED ORDER — IOHEXOL 300 MG/ML  SOLN
100.0000 mL | Freq: Once | INTRAMUSCULAR | Status: AC | PRN
  Administered 2019-05-16: 97 mL via INTRA_ARTERIAL

## 2019-05-16 MED ORDER — SODIUM CHLORIDE 0.9 % IV SOLN
INTRAVENOUS | Status: DC
  Administered 2019-05-16: 09:00:00 via INTRAVENOUS

## 2019-05-16 NOTE — Procedures (Signed)
Interventional Radiology Procedure:   Indications: Hepatocelluar carcinoma.  Plan for bland embolization of lesion from left hepatic artery distribution that was not able to be completed treated during recent DEB-TACE procedure.  Procedure: Visceral angiography with bland embolization of medial left hepatic artery supplying hypervascular lesion.  Findings: Hypervascular lesion associated with medial left hepatic artery branch.  Successful embolization with 100-300 micron Embospheres.   No significant hypervascular lesion remaining at end of procedure.    Complications: None     EBL: less than 50 ml  Plan: Observe in Short Stay today and remove TR band. If asymptomatic, plan for discharge to home.   Plan for follow up MRI in 1 month to evaluate results of the catheter directed embolizations.   Jady Braggs R. Anselm Pancoast, MD  Pager: [phone]

## 2019-05-16 NOTE — Progress Notes (Signed)
Patient seen for post procedure follow up at 1530 - he denies any complaints, reports pain as 1-2/10, he has been able to urinate and is tolerating PO intake.  Left radial artery access site clean, dry, dressed appropriately with scant dried blood on dressing. Palpable left radial pulse, neurologically in tact, minimal pain to palpation of puncture site.  Patient ready for discharge home this afternoon - reviewed symptoms which would necessitate an ED visit including but not limited to: worsening pain, abdominal distention, intractable nausea/vomiting, dyspnea or chest pain. Patient instructed on no lifting anything >10 lbs for the next 7 days. IR scheduler will call patient with follow up appointment date/time. Patient states understanding to all of the above and wishes be discharged.  Please call IR with questions or concerns.  Candiss Norse, PA-C

## 2019-05-16 NOTE — Discharge Instructions (Signed)
Please call IR clinic during business hours at [phone], or after hours [phone] with any questions or concerns.  Moderate Conscious Sedation, Adult, Care After These instructions provide you with information about caring for yourself after your procedure. Your health care provider may also give you more specific instructions. Your treatment has been planned according to current medical practices, but problems sometimes occur. Call your health care provider if you have any problems or questions after your procedure. What can I expect after the procedure? After your procedure, it is common:  To feel sleepy for several hours.  To feel clumsy and have poor balance for several hours.  To have poor judgment for several hours.  To vomit if you eat too soon. Follow these instructions at home: For at least 24 hours after the procedure:   Do not: ? Participate in activities where you could fall or become injured. ? Drive. ? Use heavy machinery. ? Drink alcohol. ? Take sleeping pills or medicines that cause drowsiness. ? Make important decisions or sign legal documents. ? Take care of children on your own.  Rest. Eating and drinking  Follow the diet recommended by your health care provider.  If you vomit: ? Drink water, juice, or soup when you can drink without vomiting. ? Make sure you have little or no nausea before eating solid foods. General instructions  Have a responsible adult stay with you until you are awake and alert.  Take over-the-counter and prescription medicines only as told by your health care provider.  If you smoke, do not smoke without supervision.  Keep all follow-up visits as told by your health care provider. This is important. Contact a health care provider if:  You keep feeling nauseous or you keep vomiting.  You feel light-headed.  You develop a rash.  You have a fever. Get help right away if:  You have trouble breathing. This information  is not intended to replace advice given to you by your health care provider. Make sure you discuss any questions you have with your health care provider. Document Released: 05/31/2013 Document Revised: 07/23/2017 Document Reviewed: 11/30/2015 Elsevier Patient Education  [employer].   Embolization, Care After This sheet gives you information about how to care for yourself after your procedure. Your health care provider may also give you more specific instructions. If you have problems or questions, contact your health care provider. What can I expect after the procedure? After the procedure, it is common to have pain or bruising at the area where your catheter was inserted (insertion site). You may have other symptoms depending on the part of your body that was treated. For example:  Embolization of the arteries in the brain may cause a headache.  Embolization of the arteries in the stomach may cause loss of appetite or nausea. Follow these instructions at home: Insertion site care   If the site starts to bleed, lie down flat and put pressure on the site. If the bleeding does not stop, get help right away. This is a medical emergency.  Follow instructions from your health care provider about how to take care of your insertion site. Make sure you: ? Wash your hands with soap and water before and after you change your bandage (dressing). If soap and water are not available, use hand sanitizer. ? Change your dressing as told by your health care provider.  You may remove your dressing tomorrow. ? Leave stitches (sutures), skin glue, or adhesive strips in place. These skin  closures may need to stay in place for 2 weeks or longer. If adhesive strip edges start to loosen and curl up, you may trim the loose edges. Do not remove adhesive strips completely unless your health care provider tells you to do that.  Keep the insertion site clean. To do this: ? Gently wash the site with plain soap  and water. ? Pat the area dry with a clean towel. ? Do not rub the site. This may cause bleeding.  Check your insertion site every day for signs of infection. Check for: ? Redness, swelling, or pain. ? Fluid or blood. ? Warmth. ? Pus or a bad smell.  Do not take baths, swim, or use a hot tub until your health care provider approves. You may be allowed to shower 24-48 hours after the procedure.  You may shower tomorrow. Activity   Do not lift anything that is heavier than 10 lb (4.5 kg), or the limit that you are told, until your health care provider says that it is safe.  Do not drive for 24 hours if you were given a sedative during your procedure.  Return to your normal activities as told by your health care provider. Ask your health care provider what activities are safe for you. General instructions   Take over-the-counter and prescription medicines only as told by your health care provider.  Ask your health care provider if the medicine prescribed to you: ? Requires you to avoid driving or using heavy machinery. ? Can cause constipation. You may need to take actions to prevent or treat constipation, such as:  Drink enough fluid to keep your urine pale yellow.  Take over-the-counter or prescription medicines.  Eat foods that are high in fiber, such as beans, whole grains, and fresh fruits and vegetables.  Limit foods that are high in fat and processed sugars, such as fried or sweet foods.  Keep all follow-up visits as told by your health care provider. This is important. Contact a health care provider if:  You have pain that gets worse or does not get better with medicine.  You have a fever.  You have redness, swelling, or pain around your insertion site.  You have fluid or blood coming from your insertion site.  Your insertion site is warm to the touch.  You have pus or a bad smell coming from your insertion site.  You have nausea or vomiting. Get help right  away if:  The insertion area swells very quickly.  The insertion area is bleeding, and the bleeding does not stop after you hold steady pressure on the area.  The area near or just beyond the insertion site becomes pale, cool, tingly, or numb.  You faint or feel like you might faint.  You have chest pain.  You have trouble breathing.  You feel weak or have trouble moving your arms or legs.  You have problems with balance, speech, or vision. These symptoms may represent a serious problem that is an emergency. Do not wait to see if the symptoms will go away. Get medical help right away. Call your local emergency services (911 in the [place] Do not drive yourself to the hospital. Summary  After your procedure, it is common to have pain or bruising at the area where your catheter was inserted (insertion site).  Do not drive for 24 hours if you were given a sedative during your procedure.  Follow instructions from your health care provider about how to take care of  your insertion site.  Contact a health care provider if you have a fever or if you have redness, swelling, or pain around your insertion site.  Keep all follow-up visits as told by your health care provider. This is important. This information is not intended to replace advice given to you by your health care provider. Make sure you discuss any questions you have with your health care provider. Document Released: 08/15/2013 Document Revised: 04/11/2018 Document Reviewed: 04/11/2018 Elsevier Patient Education  2020 [employer].

## 2019-05-16 NOTE — Sedation Documentation (Signed)
Zosyn to be given over 30 min per MD

## 2019-05-16 NOTE — H&P (Signed)
Chief Complaint: Patient was seen in consultation today for image guided hepatic embolization  Referring Physician(s): Henn,Adam  Supervising Physician: Markus Daft  Patient Status: [place]  History of Present Illness: Aaron [name] is a 73 y.o. male with a past medical history significant for DM II, HLD, HTN, anemia, hepatitis C and [place] diagnosed 2015 with previous history of DEB-TACE 03/13/2014 and 04/04/19 as well as thermal ablation 06/01/2014, 02/10/2016, 07/21/2017, 06/29/18 all of which were performed by Dr. Anselm Pancoast who presents today for a bland hepatic embolization as previously discussed in consultation with Dr. Anselm Pancoast on 03/22/19. Briefly, Aaron [name] underwent routine follow up MRI on 03/14/19 which demonstrated at least 2 new areas of concern around the previously ablated segment 8 lesion and after follow up with Dr. Anselm Pancoast on 7/29 decision was made to proceed with further embolization. Patient underwent hepatic arteriography with chemoembolization on 04/04/19 however the lesion supplied by the left hepatic artery was initially seen but the main supplying branch repeatedly went into spasm and possible dissection which meant that minimal DEB-TACE was administered to the left hepatic artery lesion. After further discussion decision was made for Aaron [name] to return to IR for a repeat bland embolization to further address these lesions.  Aaron [name] denies any complaints today - he states he has had minimal pain after each procedure, he really only report pain after removal of the foley catheter which lasts for several days and occurs each time he urinates. We discussed radial artery approach today which patient is agreeable to. Patient states understanding of planned procedure and wishes to proceed.   Past Medical History:  Diagnosis Date   Arthritis    Diabetes mellitus without complication ([place])    type II    Elevated liver enzymes    Fatty liver    Hepatitis C    genotype 1A  status post treatment with Linzie Collin and Ribavarin for 24 weeks   Hepatocellular carcinoma ([place]) 01/30/2014   Path   History of colon polyps    Hyperlipidemia    Hypertension    Iron deficiency anemia    hx of    MGUS (monoclonal gammopathy of unknown significance)     Past Surgical History:  Procedure Laterality Date   COLONOSCOPY     ESOPHAGOGASTRODUODENOSCOPY  2016   IR ANGIOGRAM SELECTIVE EACH ADDITIONAL VESSEL  04/04/2019   IR ANGIOGRAM SELECTIVE EACH ADDITIONAL VESSEL  04/04/2019   IR ANGIOGRAM SELECTIVE EACH ADDITIONAL VESSEL  04/04/2019   IR ANGIOGRAM SELECTIVE EACH ADDITIONAL VESSEL  04/04/2019   IR ANGIOGRAM SELECTIVE EACH ADDITIONAL VESSEL  04/04/2019   IR ANGIOGRAM SELECTIVE EACH ADDITIONAL VESSEL  04/04/2019   IR ANGIOGRAM SELECTIVE EACH ADDITIONAL VESSEL  04/04/2019   IR ANGIOGRAM VISCERAL SELECTIVE  04/04/2019   IR EMBO TUMOR ORGAN ISCHEMIA INFARCT INC GUIDE ROADMAPPING  04/04/2019   IR GENERIC HISTORICAL  02/26/2016   IR RADIOLOGIST EVAL & MGMT 02/26/2016 Markus Daft, MD [facility]   IR GENERIC HISTORICAL  01/16/2016   IR RADIOLOGIST EVAL & MGMT 01/16/2016 Markus Daft, MD [facility]   IR GENERIC HISTORICAL  06/24/2016   IR RADIOLOGIST EVAL & MGMT 06/24/2016 Aletta Edouard, MD [facility]   IR RADIOLOGIST EVAL & MGMT  12/17/2016   IR RADIOLOGIST EVAL & MGMT  06/16/2017   IR RADIOLOGIST EVAL & MGMT  09/08/2017   IR RADIOLOGIST EVAL & MGMT  10/26/2017   IR RADIOLOGIST EVAL & MGMT  05/12/2018   IR RADIOLOGIST EVAL & MGMT  07/26/2018   IR RADIOLOGIST EVAL & MGMT  03/22/2019   IR RADIOLOGIST EVAL & MGMT  04/11/2019   IR US GUIDE VASC ACCESS RIGHT  04/04/2019   POLYPECTOMY     RADIOFREQUENCY ABLATION N/A 07/21/2017   Procedure: CT MICROWAVE THERMAL ABLATION-LIVER;  Surgeon: Markus Daft, MD;  Location: [facility];  Service: Anesthesiology;  Laterality: N/A;   RADIOFREQUENCY ABLATION N/A 06/29/2018   Procedure: CT MICROWAVE THERMAL ABLATION;  Surgeon:  Markus Daft, MD;  Location: [facility];  Service: Anesthesiology;  Laterality: N/A;    Allergies: Patient has no known allergies.  Medications: Prior to Admission medications   Medication Sig Start Date End Date Taking? Authorizing Provider  ALPRAZolam Duanne Moron) 0.5 MG tablet Take 1 tablet (0.5 mg total) by mouth as directed. Take 2 tablets (1.0 mg total) prior to MR scan.  Make take an additional 0.5 mg po if needed. 10/26/17   Markus Daft, MD  aspirin EC 81 MG tablet Take 81 mg by mouth every morning.     [provider]  cholecalciferol (VITAMIN D) 1000 UNITS tablet Take 1,000 Units by mouth every morning.     [provider]  Insulin Glargine (BASAGLAR KWIKPEN) 100 UNIT/ML SOPN Inject 32 Units into the skin daily after breakfast.    [provider]  lisinopril-hydrochlorothiazide (PRINZIDE,ZESTORETIC) 20-12.5 MG tablet Take 1 tablet by mouth every morning.     [provider]  metFORMIN (GLUCOPHAGE) 1000 MG tablet Take 1,000 mg by mouth 2 (two) times daily with a meal.    [provider]  Multiple Vitamin (MULTIVITAMIN WITH MINERALS) TABS tablet Take 1 tablet daily by mouth.    [provider]  oxyCODONE (OXY IR/ROXICODONE) 5 MG immediate release tablet Take 1 tablet (5 mg total) by mouth every 6 (six) hours as needed for up to 15 doses for moderate pain or severe pain. Patient not taking: Reported on 04/04/2019 06/30/18   Candiss Norse A, PA-C  PAZEO 0.7 % SOLN Place 1 drop into both eyes every morning.  05/09/18   [provider]  simvastatin (ZOCOR) 40 MG tablet Take 20 mg by mouth every evening.     [provider]     No family history on file.  Social History   Socioeconomic History   Marital status: Married    Spouse name: Not on file   Number of children: Not on file   Years of education: Not on file   Highest education level: Not on file  Occupational History   Not on file  Social Needs   Financial  resource strain: Not on file   Food insecurity    Worry: Not on file    Inability: Not on file   Transportation needs    Medical: Not on file    Non-medical: Not on file  Tobacco Use   Smoking status: Former Smoker    Quit date: 01/30/1994    Years since quitting: 25.3   Smokeless tobacco: Never Used   Tobacco comment: stopped 25 yrs ago  Substance and Sexual Activity   Alcohol use: No    Comment: stopped 30 years ago    Drug use: No   Sexual activity: Not Currently  Lifestyle   Physical activity    Days per week: Not on file    Minutes per session: Not on file   Stress: Not on file  Relationships   Social connections    Talks on phone: Not on file    Gets together: Not  on file    Attends religious service: Not on file    Active member of club or organization: Not on file    Attends meetings of clubs or organizations: Not on file    Relationship status: Not on file  Other Topics Concern   Not on file  Social History Narrative   Not on file     Review of Systems: A 12 point ROS discussed and pertinent positives are indicated in the HPI above.  All other systems are negative.  Review of Systems  Constitutional: Negative for appetite change, chills and fever.  HENT: Negative for nosebleeds.   Respiratory: Negative for cough and shortness of breath.   Cardiovascular: Negative for chest pain.  Gastrointestinal: Negative for abdominal pain, blood in stool, diarrhea, nausea and vomiting.  Genitourinary: Negative for dysuria and hematuria.  Musculoskeletal: Negative for back pain.  Skin: Negative for rash.  Neurological: Negative for dizziness, syncope and headaches.    Vital Signs: BP (!) 168/89 (BP Location: Right Arm)    Pulse 77    Temp 98.7 F (37.1 C) (Oral)    Resp 18    SpO2 99%   Physical Exam Vitals signs reviewed.  Constitutional:      General: He is not in acute distress. HENT:     Head: Normocephalic.     Mouth/Throat:     Mouth: Mucous  membranes are moist.     Pharynx: Oropharynx is clear. No oropharyngeal exudate or posterior oropharyngeal erythema.  Cardiovascular:     Rate and Rhythm: Normal rate and regular rhythm.  Pulmonary:     Effort: Pulmonary effort is normal.     Breath sounds: Normal breath sounds.  Abdominal:     General: Bowel sounds are normal. There is no distension.     Palpations: Abdomen is soft.     Tenderness: There is no abdominal tenderness.  Skin:    General: Skin is warm and dry.  Neurological:     Mental Status: He is alert and oriented to person, place, and time.  Psychiatric:        Mood and Affect: Mood normal.        Behavior: Behavior normal.        Thought Content: Thought content normal.        Judgment: Judgment normal.      MD Evaluation Airway: WNL Heart: WNL Abdomen: WNL Chest/ Lungs: WNL ASA  Classification: 3 Mallampati/Airway Score: Two   Imaging: Ct Chest Wo Contrast  Result Date: 04/18/2019 CLINICAL DATA:  Liver cancer, status post liver ablation EXAM: CT CHEST WITHOUT CONTRAST TECHNIQUE: Multidetector CT imaging of the chest was performed following the standard protocol without IV contrast. COMPARISON:  Chest radiographs dated 07/21/2017 FINDINGS: Cardiovascular: Heart is normal in size.  No pericardial effusion. No evidence of thoracic aortic aneurysm. Mild atherosclerotic calcifications of the aortic arch. Mediastinum/Nodes: Small mediastinal lymph nodes which do not meet pathologic CT size criteria. Visualized thyroid is unremarkable. Lungs/Pleura: Moderate paraseptal and mild centrilobular emphysematous changes at the lung apices. No focal consolidation. No suspicious pulmonary nodules. No pleural effusion or pneumothorax. Upper Abdomen: Visualized liver is poorly evaluated on unenhanced CT, with suspected post ablation changes in the posterior right hepatic lobe. These findings are better evaluated on prior MR. Musculoskeletal: Visualized osseous structures are  within normal limits. IMPRESSION: No findings suspicious for metastatic disease in the chest. Aortic Atherosclerosis (ICD10-I70.0) and Emphysema (ICD10-J43.9). Electronically Signed   By: Julian Hy M.D.   On: 04/18/2019  15:53    Labs:  CBC: Recent Labs    06/21/18 0837 06/29/18 0733 03/30/19 0740 04/04/19 1235  WBC 5.2 5.3 6.1 6.7  HGB 11.7* 11.6* 11.6* 12.1*  HCT 39.7 39.8 38.4* 40.9  PLT 287 269 285 281    COAGS: Recent Labs    06/29/18 0733 03/30/19 0741 04/04/19 1235  INR 0.96 0.9 0.9  APTT 31  --   --     BMP: Recent Labs    06/29/18 0733 03/14/19 1230 03/30/19 0740 04/04/19 1235 04/05/19 0349  NA 139  --  138 139 136  K 4.1  --  4.8 4.2 4.8  CL 99  --  102 100 104  CO2 28  --  22 27 23   GLUCOSE 140*  --  166* 150* 213*  BUN 23  --  21 20 25*  CALCIUM 10.0  --  9.8 10.0 8.7*  CREATININE 1.48* 1.58* 1.56* 1.40* 1.48*  GFRNONAA 46* 43* 44* 50* 47*  GFRAA 53* 50* 51* 58* 54*    LIVER FUNCTION TESTS: Recent Labs    06/29/18 0733 03/30/19 0740 04/04/19 1235 04/05/19 0349  BILITOT 0.6 0.4 0.6 0.5  AST 24 21 21 22   ALT 20 19 21 19   ALKPHOS 38 46 47 38  PROT 8.3* 7.7 8.8* 6.9  ALBUMIN 4.6 4.1 4.6 3.4*    TUMOR MARKERS: No results for input(s): AFPTM, CEA, CA199, CHROMGRNA in the last 8760 hours.  Assessment and Plan:  73 y/o with past medical history of [place] previous history of DEB-TACE 03/13/2014 and 04/04/19 as well as thermal ablation 06/01/2014, 02/10/2016, 07/21/2017, 06/29/18 all of which were performed by Dr. Anselm Pancoast who presents today for a bland hepatic embolization as previously discussed in consultation with Dr. Anselm Pancoast.  Patient has been NPO since midnight, he does not take blood thinning medications. Afebrile, WBC 5.0, hgb 11.5, plt 278, creatinine 1.44, INR 1.0.  The Risks and benefits of embolization were discussed with the patient including, but not limited to bleeding, infection, vascular injury, post operative pain, or contrast  induced renal failure.  This procedure involves the use of X-rays and because of the nature of the planned procedure, it is possible that we will have prolonged use of X-ray fluoroscopy.  Potential radiation risks to you include (but are not limited to) the following: - A slightly elevated risk for cancer several years later in life. This risk is typically less than 0.5% percent. This risk is low in comparison to the normal incidence of human cancer, which is 33% for women and 50% for men according to the [place]. - Radiation induced injury can include skin redness, resembling a rash, tissue breakdown / ulcers and hair loss (which can be temporary or permanent).  The likelihood of either of these occurring depends on the difficulty of the procedure and whether you are sensitive to radiation due to previous procedures, disease, or genetic conditions.  IF your procedure requires a prolonged use of radiation, you will be notified and given written instructions for further action. It is your responsibility to monitor the irradiated area for the 2 weeks following the procedure and to notify your physician if you are concerned that you have suffered a radiation induced injury.   All of the patient's questions were answered, patient is agreeable to proceed. Consent signed and in chart.  Thank you for this interesting consult.  I greatly enjoyed meeting Aaron [name] and look forward to participating in their care.  A copy  of this report was sent to the requesting provider on this date.  Electronically Signed: Joaquim Nam, PA-C 05/16/2019, 8:28 AM   I spent a total of   25 Minutes in face to face in clinical consultation, greater than 50% of which was counseling/coordinating care for bland hepatic embolization.

## 2019-05-16 NOTE — Sedation Documentation (Signed)
TR band placed with 12 cc air for hemostasis

## 2019-05-19 ENCOUNTER — Other Ambulatory Visit: Payer: Self-pay | Admitting: Diagnostic Radiology

## 2019-05-19 DIAGNOSIS — C220 Liver cell carcinoma: Secondary | ICD-10-CM

## 2019-05-19 DIAGNOSIS — I10 Essential (primary) hypertension: Secondary | ICD-10-CM | POA: Diagnosis not present

## 2019-05-19 DIAGNOSIS — R7309 Other abnormal glucose: Secondary | ICD-10-CM | POA: Diagnosis not present

## 2019-05-22 DIAGNOSIS — D472 Monoclonal gammopathy: Secondary | ICD-10-CM | POA: Diagnosis not present

## 2019-05-22 DIAGNOSIS — R7309 Other abnormal glucose: Secondary | ICD-10-CM | POA: Diagnosis not present

## 2019-05-22 DIAGNOSIS — C229 Malignant neoplasm of liver, not specified as primary or secondary: Secondary | ICD-10-CM | POA: Diagnosis not present

## 2019-05-22 DIAGNOSIS — E611 Iron deficiency: Secondary | ICD-10-CM | POA: Diagnosis not present

## 2019-05-26 DIAGNOSIS — I10 Essential (primary) hypertension: Secondary | ICD-10-CM | POA: Diagnosis not present

## 2019-05-26 DIAGNOSIS — N183 Chronic kidney disease, stage 3 unspecified: Secondary | ICD-10-CM | POA: Diagnosis not present

## 2019-05-26 DIAGNOSIS — E119 Type 2 diabetes mellitus without complications: Secondary | ICD-10-CM | POA: Diagnosis not present

## 2019-05-26 DIAGNOSIS — E1122 Type 2 diabetes mellitus with diabetic chronic kidney disease: Secondary | ICD-10-CM | POA: Diagnosis not present

## 2019-05-26 DIAGNOSIS — E7800 Pure hypercholesterolemia, unspecified: Secondary | ICD-10-CM | POA: Diagnosis not present

## 2019-05-26 DIAGNOSIS — Z23 Encounter for immunization: Secondary | ICD-10-CM | POA: Diagnosis not present

## 2019-05-26 DIAGNOSIS — C229 Malignant neoplasm of liver, not specified as primary or secondary: Secondary | ICD-10-CM | POA: Diagnosis not present

## 2019-06-21 ENCOUNTER — Other Ambulatory Visit: Payer: Self-pay

## 2019-06-21 ENCOUNTER — Ambulatory Visit (HOSPITAL_COMMUNITY)
Admission: RE | Admit: 2019-06-21 | Discharge: 2019-06-21 | Disposition: A | Discharge status: Home / Self Care | Payer: Medicare HMO | Source: Ambulatory Visit | Attending: Diagnostic Radiology | Admitting: Diagnostic Radiology

## 2019-06-21 DIAGNOSIS — C220 Liver cell carcinoma: Secondary | ICD-10-CM | POA: Diagnosis not present

## 2019-06-21 MED ORDER — GADOBUTROL 1 MMOL/ML IV SOLN
10.0000 mL | Freq: Once | INTRAVENOUS | Status: AC | PRN
  Administered 2019-06-21: 10 mL via INTRAVENOUS

## 2019-06-28 ENCOUNTER — Ambulatory Visit
Admission: RE | Admit: 2019-06-28 | Discharge: 2019-06-28 | Disposition: A | Discharge status: Home / Self Care | Payer: Medicare HMO | Source: Ambulatory Visit | Attending: Diagnostic Radiology | Admitting: Diagnostic Radiology

## 2019-06-28 ENCOUNTER — Encounter: Payer: Self-pay | Admitting: *Deleted

## 2019-06-28 ENCOUNTER — Other Ambulatory Visit: Payer: Self-pay

## 2019-06-28 DIAGNOSIS — C220 Liver cell carcinoma: Secondary | ICD-10-CM | POA: Diagnosis not present

## 2019-06-28 HISTORY — PX: IR RADIOLOGIST EVAL & MGMT: IMG5224

## 2019-06-28 NOTE — Progress Notes (Signed)
Chief Complaint: Patient was consulted remotely today (TeleHealth) for follow-up of hepatocellular carcinoma.  Referring Physician(s): Valerie Salts  History of Present Illness: Jaxyn Rout is a 73 y.o. male with history of hepatitis C, hepatocellular carcinoma and monoclonal gammopathy of unknown significance.  Hepatocellular carcinoma was diagnosed in 2015.  The initial lesion was treated with a combination of DEB-TACE and microwave ablation.  Patient underwent additional microwave ablations for recurrent disease and new lesions.  Most recently, the patient underwent DEB-TACE procedure on 04/04/2019 and bland embolization procedure on 05/16/2019.  The recent catheter directed embolization procedures treated new disease around ablation sites in segment 7 and 8.  Patient had a follow-up MRI on 06/21/2019.  Patient reports no symptoms following the recent bland embolization on 05/16/2019.  He was happy with transradial access and has had no issues with his left hand.  He says that he can feel a left radial artery pulse.  He reports to be normal energy level.  He is still working outside and doing his normal activities.  No new complaints.  Past Medical History:  Diagnosis Date   Arthritis    Diabetes mellitus without complication ([place])    type II    Elevated liver enzymes    Fatty liver    Hepatitis C    genotype 1A status post treatment with Linzie Collin and Ribavarin for 24 weeks   Hepatocellular carcinoma ([place]) 01/30/2014   Path   History of colon polyps    Hyperlipidemia    Hypertension    Iron deficiency anemia    hx of    MGUS (monoclonal gammopathy of unknown significance)     Past Surgical History:  Procedure Laterality Date   COLONOSCOPY     ESOPHAGOGASTRODUODENOSCOPY  2016   IR ANGIOGRAM SELECTIVE EACH ADDITIONAL VESSEL  04/04/2019   IR ANGIOGRAM SELECTIVE EACH ADDITIONAL VESSEL  04/04/2019   IR ANGIOGRAM SELECTIVE EACH ADDITIONAL VESSEL  04/04/2019   IR  ANGIOGRAM SELECTIVE EACH ADDITIONAL VESSEL  04/04/2019   IR ANGIOGRAM SELECTIVE EACH ADDITIONAL VESSEL  04/04/2019   IR ANGIOGRAM SELECTIVE EACH ADDITIONAL VESSEL  04/04/2019   IR ANGIOGRAM SELECTIVE EACH ADDITIONAL VESSEL  04/04/2019   IR ANGIOGRAM SELECTIVE EACH ADDITIONAL VESSEL  05/16/2019   IR ANGIOGRAM SELECTIVE EACH ADDITIONAL VESSEL  05/16/2019   IR ANGIOGRAM SELECTIVE EACH ADDITIONAL VESSEL  05/16/2019   IR ANGIOGRAM VISCERAL SELECTIVE  04/04/2019   IR ANGIOGRAM VISCERAL SELECTIVE  05/16/2019   IR EMBO TUMOR ORGAN ISCHEMIA INFARCT INC GUIDE ROADMAPPING  04/04/2019   IR EMBO TUMOR ORGAN ISCHEMIA INFARCT INC GUIDE ROADMAPPING  05/16/2019   IR GENERIC HISTORICAL  02/26/2016   IR RADIOLOGIST EVAL & MGMT 02/26/2016 Markus Daft, MD [facility]   IR GENERIC HISTORICAL  01/16/2016   IR RADIOLOGIST EVAL & MGMT 01/16/2016 Markus Daft, MD [facility]   IR GENERIC HISTORICAL  06/24/2016   IR RADIOLOGIST EVAL & MGMT 06/24/2016 Aletta Edouard, MD [facility]   IR RADIOLOGIST EVAL & MGMT  12/17/2016   IR RADIOLOGIST EVAL & MGMT  06/16/2017   IR RADIOLOGIST EVAL & MGMT  09/08/2017   IR RADIOLOGIST EVAL & MGMT  10/26/2017   IR RADIOLOGIST EVAL & MGMT  05/12/2018   IR RADIOLOGIST EVAL & MGMT  07/26/2018   IR RADIOLOGIST EVAL & MGMT  03/22/2019   IR RADIOLOGIST EVAL & MGMT  04/11/2019   IR US GUIDE VASC ACCESS LEFT  05/16/2019   IR US GUIDE VASC ACCESS RIGHT  04/04/2019  POLYPECTOMY     RADIOFREQUENCY ABLATION N/A 07/21/2017   Procedure: CT MICROWAVE THERMAL ABLATION-LIVER;  Surgeon: Markus Daft, MD;  Location: [facility];  Service: Anesthesiology;  Laterality: N/A;   RADIOFREQUENCY ABLATION N/A 06/29/2018   Procedure: CT MICROWAVE THERMAL ABLATION;  Surgeon: Markus Daft, MD;  Location: [facility];  Service: Anesthesiology;  Laterality: N/A;    Allergies: Patient has no known allergies.  Medications: Prior to Admission medications   Medication Sig Start Date End Date Taking? Authorizing  Provider  ALPRAZolam Duanne Moron) 0.5 MG tablet Take 1 tablet (0.5 mg total) by mouth as directed. Take 2 tablets (1.0 mg total) prior to MR scan.  Make take an additional 0.5 mg po if needed. 10/26/17   Markus Daft, MD  aspirin EC 81 MG tablet Take 81 mg by mouth every morning.     [provider]  cholecalciferol (VITAMIN D) 1000 UNITS tablet Take 1,000 Units by mouth every morning.     [provider]  Insulin Glargine (BASAGLAR KWIKPEN) 100 UNIT/ML SOPN Inject 32 Units into the skin daily after breakfast.    [provider]  lisinopril-hydrochlorothiazide (PRINZIDE,ZESTORETIC) 20-12.5 MG tablet Take 1 tablet by mouth every morning.     [provider]  metFORMIN (GLUCOPHAGE) 1000 MG tablet Take 1,000 mg by mouth 2 (two) times daily with a meal.    [provider]  Multiple Vitamin (MULTIVITAMIN WITH MINERALS) TABS tablet Take 1 tablet daily by mouth.    [provider]  oxyCODONE (OXY IR/ROXICODONE) 5 MG immediate release tablet Take 1 tablet (5 mg total) by mouth every 6 (six) hours as needed for up to 15 doses for moderate pain or severe pain. Patient not taking: Reported on 04/04/2019 06/30/18   Candiss Norse A, PA-C  PAZEO 0.7 % SOLN Place 1 drop into both eyes every morning.  05/09/18   [provider]  simvastatin (ZOCOR) 40 MG tablet Take 20 mg by mouth every evening.     [provider]     No family history on file.  Social History   Socioeconomic History   Marital status: Married    Spouse name: Not on file   Number of children: Not on file   Years of education: Not on file   Highest education level: Not on file  Occupational History   Not on file  Social Needs   Financial resource strain: Not on file   Food insecurity    Worry: Not on file    Inability: Not on file   Transportation needs    Medical: Not on file    Non-medical: Not on file  Tobacco Use   Smoking status: Former Smoker    Quit  date: 01/30/1994    Years since quitting: 25.4   Smokeless tobacco: Never Used   Tobacco comment: stopped 25 yrs ago  Substance and Sexual Activity   Alcohol use: No    Comment: stopped 30 years ago    Drug use: No   Sexual activity: Not Currently  Lifestyle   Physical activity    Days per week: Not on file    Minutes per session: Not on file   Stress: Not on file  Relationships   Social connections    Talks on phone: Not on file    Gets together: Not on file    Attends religious service: Not on file    Active member of club or organization: Not on file    Attends meetings of clubs or  organizations: Not on file    Relationship status: Not on file  Other Topics Concern   Not on file  Social History Narrative   Not on file      Review of Systems  Constitutional: Negative.   Gastrointestinal: Negative.      Physical Exam  Vital Signs: There were no vitals taken for this visit.  Imaging: Mr Abdomen Wwo Contrast  Result Date: 06/21/2019 CLINICAL DATA:  73 year old male status post bland embolization of hepatocellular carcinoma on 05/16/2019. Follow-up study. EXAM: MRI ABDOMEN WITHOUT AND WITH CONTRAST TECHNIQUE: Multiplanar multisequence MR imaging of the abdomen was performed both before and after the administration of intravenous contrast. CONTRAST:  108mL GADAVIST GADOBUTROL 1 MMOL/ML IV SOLN COMPARISON:  Abdominal MRI 03/14/2019. FINDINGS: Lower chest: Unremarkable. Hepatobiliary: The 2 previously ablated hepatic lesions are again noted, largest of which is in the central aspect of the liver between segments 4A and 8 (axial image 21 of series 17) measuring approximately 5.1 x 2.5 cm, although there are multiple surrounding areas of decreased enhancement, altered signal intensity and mild diffusion restriction in the adjacent hepatic parenchyma, which suggests some surrounding spread of tumor into adjacent liver, most evident in segment 4B (axial image 27 of series  17) and in segment 8 (axial image 22 of series 17). The lesion in the periphery of segment 7 (axial image 21 of series 17) currently measures 3.6 x 2.5 cm and is heterogeneous in signal intensity on T1 and T2 weighted images, with a small amount of enhancement along its posterior margin (axial image 22 of series 19) on delayed images, which may suggest residual tumor. Importantly, there is a new lesion in the medial aspect of segment 7 (axial image 23 of series 19) which measures 1.8 x 1.1 cm and is T1 hypointense, T2 hyperintense, demonstrates some peripheral enhancement which is noted both on arterial and more delayed phase post gadolinium imaging, and clearly demonstrates diffusion restriction, concerning for a new metastatic lesion. No intra or extrahepatic biliary ductal dilatation. Gallbladder is normal in appearance. Pancreas: No pancreatic mass. No pancreatic ductal dilatation. No pancreatic or peripancreatic fluid collections or inflammatory changes. Spleen:  Unremarkable. Adrenals/Urinary Tract: Bilateral kidneys and adrenal glands are normal in appearance. No hydroureteronephrosis in the visualized portions of the abdomen. Stomach/Bowel: Visualized portions are unremarkable. Vascular/Lymphatic: No aneurysm identified in the visualized abdominal vasculature. No lymphadenopathy noted in the abdomen. Other: No significant volume of ascites noted in the visualized portions of the peritoneal cavity. Musculoskeletal: No aggressive appearing osseous lesions are noted in the visualized portions of the skeleton. IMPRESSION: 1. Previously ablated lesions appear generally similar in size to the prior examination, although there is some enhancing soft tissue along the posterior margin of the smaller lesion in segment 7 of the liver, and there are areas of altered signal intensity in the hepatic parenchyma adjacent to the larger lesion between segments 4A and 8 which are concerning for additional local spread of  disease. 2. New lesion in segment 7 concerning for an additional metastatic lesion, as above. Electronically Signed   By: Vinnie Langton M.D.   On: 06/21/2019 11:16    Labs:  CBC: Recent Labs    06/29/18 0733 03/30/19 0740 04/04/19 1235 05/16/19 0800  WBC 5.3 6.1 6.7 5.0  HGB 11.6* 11.6* 12.1* 11.5*  HCT 39.8 38.4* 40.9 39.3  PLT 269 285 281 278    COAGS: Recent Labs    06/29/18 0733 03/30/19 0741 04/04/19 1235 05/16/19 0800  INR 0.96 0.9  0.9 1.0  APTT 31  --   --   --     BMP: Recent Labs    03/30/19 0740 04/04/19 1235 04/05/19 0349 05/16/19 0800  NA 138 139 136 137  K 4.8 4.2 4.8 4.0  CL 102 100 104 100  CO2 22 27 23 25   GLUCOSE 166* 150* 213* 178*  BUN 21 20 25* 25*  CALCIUM 9.8 10.0 8.7* 9.4  CREATININE 1.56* 1.40* 1.48* 1.44*  GFRNONAA 44* 50* 47* 48*  GFRAA 51* 58* 54* 56*    LIVER FUNCTION TESTS: Recent Labs    03/30/19 0740 04/04/19 1235 04/05/19 0349 05/16/19 0800  BILITOT 0.4 0.6 0.5 0.6  AST 21 21 22 29   ALT 19 21 19 22   ALKPHOS 46 47 38 49  PROT 7.7 8.8* 6.9 8.5*  ALBUMIN 4.1 4.6 3.4* 4.5    TUMOR MARKERS: No results for input(s): AFPTM, CEA, CA199, CHROMGRNA in the last 8760 hours.  Assessment and Plan:  73 year old with recurrent multifocal hepatocellular carcinoma.  Liver disease has been well controlled with liver directed therapy for approximately 5 years.  Most recent MRI demonstrates another new lesion in segment 7 measuring up to 1.8 cm.  Difficult to evaluate the recently treated lesions but difficult to exclude a small amount of recurrent or new disease around the recently treated areas.  At this point, I feel like we can no longer control the disease by targeting 1 or 2 lesions at a time.  We may want to consider systemic therapy with Dr. Irene Limbo along with Y 90 radioembolization.  I think he is scheduled to follow-up with oncology in 1 to 2 months.  Alternatively, we discussed waiting another 3 months to see how the recently  treated disease responds and to follow the new lesion in segment 7.  Patient understands that the hepatocellular carcinoma is not curable but, fortunately, the disease burden remains small.  Will contact Mr. Moore after I have discussed with Dr. Irene Limbo in oncology.   Thank you for this interesting consult.  I greatly enjoyed meeting Javed Cotto and look forward to participating in their care.  A copy of this report was sent to the requesting provider on this date.  Electronically Signed: Burman Riis 06/28/2019, 3:14 PM   I spent a total of    10 Minutes in remote  clinical consultation, greater than 50% of which was counseling/coordinating care for hepatocellular carcinoma.    Visit type: Audio only (telephone). Audio (no video) only due to Patient preference. Alternative for in-person consultation at [facility], [address], [place], [place]. This visit type was conducted due to national recommendations for restrictions regarding the COVID-19 Pandemic (e.g. social distancing).  This format is felt to be most appropriate for this patient at this time.  All issues noted in this document were discussed and addressed.  Patient ID: Danzig Macgregor, male   DOB: 07-02-1946, 73 y.o.   MRN: [id number]

## 2019-07-27 ENCOUNTER — Other Ambulatory Visit: Payer: Self-pay | Admitting: *Deleted

## 2019-07-27 DIAGNOSIS — C220 Liver cell carcinoma: Secondary | ICD-10-CM

## 2019-07-27 DIAGNOSIS — D472 Monoclonal gammopathy: Secondary | ICD-10-CM

## 2019-07-28 ENCOUNTER — Inpatient Hospital Stay (HOSPITAL_BASED_OUTPATIENT_CLINIC_OR_DEPARTMENT_OTHER): Payer: Medicare HMO | Admitting: Hematology

## 2019-07-28 ENCOUNTER — Inpatient Hospital Stay: Payer: Medicare HMO | Attending: Hematology

## 2019-07-28 ENCOUNTER — Other Ambulatory Visit: Payer: Self-pay

## 2019-07-28 VITALS — BP 157/70 | HR 65 | Temp 98.2°F | Resp 16 | Ht 72.0 in | Wt 264.9 lb

## 2019-07-28 DIAGNOSIS — Z8505 Personal history of malignant neoplasm of liver: Secondary | ICD-10-CM | POA: Diagnosis not present

## 2019-07-28 DIAGNOSIS — D509 Iron deficiency anemia, unspecified: Secondary | ICD-10-CM | POA: Diagnosis not present

## 2019-07-28 DIAGNOSIS — C220 Liver cell carcinoma: Secondary | ICD-10-CM

## 2019-07-28 DIAGNOSIS — D472 Monoclonal gammopathy: Secondary | ICD-10-CM

## 2019-07-28 LAB — CBC WITH DIFFERENTIAL (CANCER CENTER ONLY)
Abs Immature Granulocytes: 0.02 10*3/uL (ref 0.00–0.07)
Basophils Absolute: 0 10*3/uL (ref 0.0–0.1)
Basophils Relative: 1 %
Eosinophils Absolute: 0.1 10*3/uL (ref 0.0–0.5)
Eosinophils Relative: 2 %
HCT: 37.9 % — ABNORMAL LOW (ref 39.0–52.0)
Hemoglobin: 11.3 g/dL — ABNORMAL LOW (ref 13.0–17.0)
Immature Granulocytes: 0 %
Lymphocytes Relative: 34 %
Lymphs Abs: 1.7 10*3/uL (ref 0.7–4.0)
MCH: 21.9 pg — ABNORMAL LOW (ref 26.0–34.0)
MCHC: 29.8 g/dL — ABNORMAL LOW (ref 30.0–36.0)
MCV: 73.6 fL — ABNORMAL LOW (ref 80.0–100.0)
Monocytes Absolute: 0.4 10*3/uL (ref 0.1–1.0)
Monocytes Relative: 8 %
Neutro Abs: 2.7 10*3/uL (ref 1.7–7.7)
Neutrophils Relative %: 55 %
Platelet Count: 276 10*3/uL (ref 150–400)
RBC: 5.15 MIL/uL (ref 4.22–5.81)
RDW: 15.9 % — ABNORMAL HIGH (ref 11.5–15.5)
WBC Count: 4.9 10*3/uL (ref 4.0–10.5)
nRBC: 0 % (ref 0.0–0.2)

## 2019-07-28 LAB — CMP (CANCER CENTER ONLY)
ALT: 18 U/L (ref 0–44)
AST: 20 U/L (ref 15–41)
Albumin: 4.3 g/dL (ref 3.5–5.0)
Alkaline Phosphatase: 48 U/L (ref 38–126)
Anion gap: 10 (ref 5–15)
BUN: 26 mg/dL — ABNORMAL HIGH (ref 8–23)
CO2: 25 mmol/L (ref 22–32)
Calcium: 9.7 mg/dL (ref 8.9–10.3)
Chloride: 106 mmol/L (ref 98–111)
Creatinine: 1.67 mg/dL — ABNORMAL HIGH (ref 0.61–1.24)
GFR, Est AFR Am: 47 mL/min — ABNORMAL LOW (ref >60)
GFR, Estimated: 40 mL/min — ABNORMAL LOW (ref >60)
Glucose, Bld: 160 mg/dL — ABNORMAL HIGH (ref 70–99)
Potassium: 5.5 mmol/L — ABNORMAL HIGH (ref 3.5–5.1)
Sodium: 141 mmol/L (ref 135–145)
Total Bilirubin: 0.3 mg/dL (ref 0.3–1.2)
Total Protein: 8.1 g/dL (ref 6.5–8.1)

## 2019-07-28 NOTE — Progress Notes (Signed)
HEMATOLOGY/ONCOLOGY CLINIC NOTE  Date of Service: 07/28/2019  Patient Care Team: Jani Gravel, MD as PCP - General (Internal Medicine)  CHIEF COMPLAINTS/PURPOSE OF CONSULTATION:  MGUS HCC  HISTORY OF PRESENTING ILLNESS:  Aaron [name] is a wonderful 73 y.o. male who has been referred to [place] by Dr Jani Gravel for evaluation and management of monoclonal gammopathy of undetermined significance.  Patient has history of hypertension, diabetes, dyslipidemia, hepatocellular carcinoma (rx with TACE and percutaneous thermal ablation), hepatitis C status post treatment, iron deficiency anemia in 2014 treated with oral iron.  Patient had an SPEP done by his primary care physician on 10/04/2015 that showed an M spike of 0.3 g/dL. It was presumably will be done due to his complaints of fatigue. He has had no significant anemia. No new bone pains. No fevers/chills/drenching night sweats.  No new anemia..  Outside labs show hemoglobin of 12.4 with microcytosis with an MCV of 70.5, normal WBC count of 5.1k.  No evidence of hypercalcemia or significant renal failure on his outside labs.   INTERVAL HISTORY  Aaron [name]  is here for his scheduled follow-up for MGUS and HCC. The patient's last visit with [place] was on 03/29/2019. The pt reports that he is doing well overall.  The pt reports he is doing pretty good.   Pt was seen 5 years ago in [place] to be evaluated for a liver transplant. He was told his weight would be an issue.   Of note since the patient's last visit, pt has had MR ABDOMEN [place] (Accession [id number]) completed on 06/21/2019 with results revealing "1. Previously ablated lesions appear generally similar in size to the prior examination, although there is some enhancing soft tissue along the posterior margin of the smaller lesion in segment 7 of the liver, and there are areas of altered signal intensity in the hepatic parenchyma adjacent to the larger lesion between segments  4A and 8 which are concerning for additional local spread of disease. 2. New lesion in segment 7 concerning for an additional metastatic lesion, as above."  Lab results today (07/28/19) of CBC w/diff and CMP is as follows: all values are WNL except for Hemoglobin at 11.3, HCT at 37.9, MCV at 73.6, MCH at 21.9, MCHC at 29.8, RDW at 15.9, Potassium at 5.5, Glucose Bld at 160, BUN at 26, Creatinine at 1.67, GFR Est Non Af Am at 40, GFR Est AFR Am at 47 PENDING AFP Tumor Marker.  On review of systems, pt reports and denies abdominal pain and any other symptoms.    MEDICAL HISTORY:  Past Medical History:  Diagnosis Date  . Arthritis   . Diabetes mellitus without complication ([place])    type II   . Elevated liver enzymes   . Fatty liver   . Hepatitis C    genotype 1A status post treatment with Viekira and Ribavarin for 24 weeks  . Hepatocellular carcinoma ([place]) 01/30/2014   Path  . History of colon polyps   . Hyperlipidemia   . Hypertension   . Iron deficiency anemia    hx of   . MGUS (monoclonal gammopathy of unknown significance)    Obesity Hepatocellular carcinoma treated with TACE and percutaneous thermal ablation by interventional radiology. Last MRI on 08/06/2015 showed slight decrease in the size of the ablation defect involving the right lobe of the liver area and no findings to suggest residual or recurrent hepatocellular carcinoma. Mild changes of liver cirrhosis. MRI Abd 06/24/2016- no evidence of recurrent [place]  Hepatitis C genotype 1A status post treatment with Viekira and Ribavarin for 24 weeks.  Monoclonal gammopathy of undetermined significance.  SURGICAL HISTORY:  Status post microwave ablation[October 2015] of liver lesion and TACE [July 2015] for [place]. EGD and colonoscopy in 2016   SOCIAL HISTORY: Social History   Socioeconomic History  . Marital status: Married    Spouse name: Not on file  . Number of children: Not on file  . Years of education: Not on file  .  Highest education level: Not on file  Occupational History  . Not on file  Social Needs  . Financial resource strain: Not on file  . Food insecurity    Worry: Not on file    Inability: Not on file  . Transportation needs    Medical: Not on file    Non-medical: Not on file  Tobacco Use  . Smoking status: Former Smoker    Quit date: 01/30/1994    Years since quitting: 25.5  . Smokeless tobacco: Never Used  . Tobacco comment: stopped 25 yrs ago  Substance and Sexual Activity  . Alcohol use: No    Comment: stopped 30 years ago   . Drug use: No  . Sexual activity: Not Currently  Lifestyle  . Physical activity    Days per week: Not on file    Minutes per session: Not on file  . Stress: Not on file  Relationships  . Social Herbalist on phone: Not on file    Gets together: Not on file    Attends religious service: Not on file    Active member of club or organization: Not on file    Attends meetings of clubs or organizations: Not on file    Relationship status: Not on file  . Intimate partner violence    Fear of current or ex partner: Not on file    Emotionally abused: Not on file    Physically abused: Not on file    Forced sexual activity: Not on file  Other Topics Concern  . Not on file  Social History Narrative  . Not on file  Former smoker and smoked 1 pack per day for about 20 years starting at age 19 quit 71 years ago.  FAMILY HISTORY: No family history on file.  ALLERGIES:  has No Known Allergies.  MEDICATIONS:  Current Outpatient Medications  Medication Sig Dispense Refill  . ALPRAZolam (XANAX) 0.5 MG tablet Take 1 tablet (0.5 mg total) by mouth as directed. Take 2 tablets (1.0 mg total) prior to MR scan.  Make take an additional 0.5 mg po if needed. 3 tablet 0  . aspirin EC 81 MG tablet Take 81 mg by mouth every morning.     . cholecalciferol (VITAMIN D) 1000 UNITS tablet Take 1,000 Units by mouth every morning.     . Insulin Glargine (BASAGLAR  KWIKPEN) 100 UNIT/ML SOPN Inject 32 Units into the skin daily after breakfast.    . lisinopril-hydrochlorothiazide (PRINZIDE,ZESTORETIC) 20-12.5 MG tablet Take 1 tablet by mouth every morning.     . metFORMIN (GLUCOPHAGE) 1000 MG tablet Take 1,000 mg by mouth 2 (two) times daily with a meal.    . Multiple Vitamin (MULTIVITAMIN WITH MINERALS) TABS tablet Take 1 tablet daily by mouth.    . oxyCODONE (OXY IR/ROXICODONE) 5 MG immediate release tablet Take 1 tablet (5 mg total) by mouth every 6 (six) hours as needed for up to 15 doses for moderate pain or severe pain. (Patient  not taking: Reported on 04/04/2019) 15 tablet 0  . PAZEO 0.7 % SOLN Place 1 drop into both eyes every morning.   3  . simvastatin (ZOCOR) 40 MG tablet Take 20 mg by mouth every evening.      No current facility-administered medications for this visit.     REVIEW OF SYSTEMS:    A 10+ POINT REVIEW OF SYSTEMS WAS OBTAINED including neurology, dermatology, psychiatry, cardiac, respiratory, lymph, extremities, GI, GU, Musculoskeletal, constitutional, breasts, reproductive, HEENT.  All pertinent positives are noted in the HPI.  All others are negative.   PHYSICAL EXAMINATION: ECOG FS:1 - Symptomatic but completely ambulatory  Vitals:   07/28/19 0924  BP: (!) 157/70  Pulse: 65  Resp: 16  Temp: 98.2 F (36.8 C)  SpO2: 100%   Wt Readings from Last 3 Encounters:  07/28/19 264 lb 14.4 oz (120.2 kg)  05/16/19 272 lb (123.4 kg)  04/05/19 272 lb (123.4 kg)   Body mass index is 35.93 kg/m.    GENERAL:alert, in no acute distress and comfortable SKIN: no acute rashes, no significant lesions EYES: conjunctiva are pink and non-injected, sclera anicteric OROPHARYNX: MMM, no exudates, no oropharyngeal erythema or ulceration NECK: supple, no JVD LYMPH:  no palpable lymphadenopathy in the cervical, axillary or inguinal regions LUNGS: clear to auscultation b/l with normal respiratory effort HEART: regular rate & rhythm ABDOMEN:   normoactive bowel sounds , non tender, not distended. Extremity: no pedal edema PSYCH: alert & oriented x 3 with fluent speech NEURO: no focal motor/sensory deficits   LABORATORY DATA:  I have reviewed the data as listed  . CBC Latest Ref Rng & Units 07/28/2019 05/16/2019 04/04/2019  WBC 4.0 - 10.5 K/uL 4.9 5.0 6.7  Hemoglobin 13.0 - 17.0 g/dL 11.3(L) 11.5(L) 12.1(L)  Hematocrit 39.0 - 52.0 % 37.9(L) 39.3 40.9  Platelets 150 - 400 K/uL 276 278 281   . CBC    Component Value Date/Time   WBC 4.9 07/28/2019 0836   WBC 5.0 05/16/2019 0800   RBC 5.15 07/28/2019 0836   HGB 11.3 (L) 07/28/2019 0836   HGB 11.5 (L) 03/22/2017 1402   HCT 37.9 (L) 07/28/2019 0836   HCT 37.0 (L) 03/22/2017 1402   PLT 276 07/28/2019 0836   PLT 262 03/22/2017 1402   MCV 73.6 (L) 07/28/2019 0836   MCV 72.3 (L) 03/22/2017 1402   MCH 21.9 (L) 07/28/2019 0836   MCHC 29.8 (L) 07/28/2019 0836   RDW 15.9 (H) 07/28/2019 0836   RDW 15.6 (H) 03/22/2017 1402   LYMPHSABS 1.7 07/28/2019 0836   LYMPHSABS 1.8 03/22/2017 1402   MONOABS 0.4 07/28/2019 0836   MONOABS 0.4 03/22/2017 1402   EOSABS 0.1 07/28/2019 0836   EOSABS 0.1 03/22/2017 1402   BASOSABS 0.0 07/28/2019 0836   BASOSABS 0.0 03/22/2017 1402    . CMP Latest Ref Rng & Units 07/28/2019 05/16/2019 04/05/2019  Glucose 70 - 99 mg/dL 160(H) 178(H) 213(H)  BUN 8 - 23 mg/dL 26(H) 25(H) 25(H)  Creatinine 0.61 - 1.24 mg/dL 1.67(H) 1.44(H) 1.48(H)  Sodium 135 - 145 mmol/L 141 137 136  Potassium 3.5 - 5.1 mmol/L 5.5(H) 4.0 4.8  Chloride 98 - 111 mmol/L 106 100 104  CO2 22 - 32 mmol/L 25 25 23   Calcium 8.9 - 10.3 mg/dL 9.7 9.4 8.7(L)  Total Protein 6.5 - 8.1 g/dL 8.1 8.5(H) 6.9  Total Bilirubin 0.3 - 1.2 mg/dL 0.3 0.6 0.5  Alkaline Phos 38 - 126 U/L 48 49 38  AST 15 - 41  U/L 20 29 22   ALT 0 - 44 U/L 18 22 19   06/21/2019 MR ABDOMEN [place] CONTRAST (Accession [id number])     IFE 1  Comment   Comments: Immunofixation shows IgG monoclonal protein with kappa  light chain  specificity.           RADIOGRAPHIC STUDIES:  MRI abd w and wo contrast: 12/17/2016: IMPRESSION: 1. Postprocedural changes of thermal ablation again noted in the right lobe of the liver, without definitive evidence to suggest local recurrence of disease. No new hepatic lesions are noted. 2. Additional incidental findings, as above.   Electronically Signed   By: Vinnie Langton M.D.   On: 12/17/2016 09:42  ASSESSMENT & PLAN:   73 y.o. male with  #1 Monoclonal gammopathy of undetermined significance.  M protein 0.2 mg/dL immunofixation showing IgG monoclonal protein with kappa light chain specificity. SPEP 02/2016 - 0.3g/dl  No overt anemia.  No overt hypercalcemia. Stable CKD creatinine 1.6  #2 bone lucencies in the right radial mid midshaft and left humeral head. PET/CT did not show any hypermetabolic bone lesions. MRI left shoulder showed no evidence of metastatic disease or multiple myeloma. The x-ray findings appear to be areas of mild demineralization. Patient was seen by orthopedics and no additional recommendations were given.  Plan -Patient's anemia is stable. No significant change in renal function. No new bone pains. No hypercalcemia . No overall no overt evidence of progression of multiple myeloma. -Myeloma labs from today show stable M protein @ 0.2g/dl with no evidence of progression- consistent with MGUS.  #3 Hepatocellular carcinoma treated with TACE and percutaneous thermal ablation by interventional radiology.   Last MRI on 08/06/2015 showed slight decrease in the size of the ablation defect involving the right lobe of the liver area and no findings to suggest residual or recurrent hepatocellular carcinoma. Mild changes of liver cirrhosis.  01/16/2016 MRI Abdomen showed resolution of previously ablated lesion but a new 1.3 cm lesion was noted which was subsequently ablated under CT guidance by interventional radiology on 01/31/2016. Elevated  transaminases on 02/01/2016 were likely related to his ablation. These have since resolved.   06/24/2016 MRI Abdomen showed no evidence of recurrent hepatocellular carcinoma . 12/17/2016 MRI Abdomen shows no evidence of recurrent hepatocellular carcinoma .  10/26/17 MRI Abdomen revealed Motion degraded images. Status post thermal ablation of the segment 8 lesion, without enhancement to suggest residual viable tumor. Prior ablation of a segment 7 lesion, grossly unchanged. No convincing central enhancement  03/14/2019  MRI abdomen w and wo contrast revealed "1. New areas of restricted diffusion, surrounding nodular arterial phase enhancement and delayed washout surrounding the ablation zone defects anteriorly in segments 8 and 7 consistent with local recurrence of hepatocellular carcinoma. 2. The peripheral ablation zone defect laterally in segment 8 is unchanged. 3. No extrahepatic tumor identified."  #4 Hepatitis C genotype 1A status post treatment with Viekira and Ribavarin for 24 weeks.  Plan -continue f/u with IR-Dr. Markus Daft for ongoing management of [place].   - AFP tumor marker from today wnl at 1.9. -Continue follow-up and hepatitis C clinic for continued monitoring and management of his hepatitis C.  #5 Microcytosis with Minimal Anemia - Hgb electrophoresis suggestive of Beta thal trait given relative polycythemia. Some element of iron defi Iron sat 18%, ferritin 88 but could be over estimate in the setting of some liver injury Ferritin level stable at 77 today.  PLAN:   -Discussed pt labwork today, 07/28/19;  all values are WNL except  for Hemoglobin at 11.3, HCT at 37.9, MCV at 73.6, MCH at 21.9, MCHC at 29.8, RDW at 15.9, Potassium at 5.5, Glucose Bld at 160, BUN at 26, Creatinine at 1.67, GFR Est Non Af Am at 40, GFR Est AFR Am at 47  AFP Tumor Marker.-- increased to 11.1 -Discussed MR ABDOMEN [place] (Accession [id number]) completed on 10/28/2020with results revealing "1. Previously  ablated lesions appear generally similar in size to the prior examination, although there is some enhancing soft tissue along the posterior margin of the smaller lesion in segment 7 of the liver, and there are areas of altered signal intensity in the hepatic parenchyma adjacent to the larger lesion between segments 4A and 8 which are concerning for additional local spread of disease. 2. New lesion in segment 7 concerning for an additional metastatic lesion, as above." -Discussed with pt that the new area is in a location that is hard to reach for ablation.. -Discussed the options for IV immunotherapy or an oral medication to inhibite cancer cells and the timing of these considerations. -Discussed with pt that his current medical approach is not curative -he was referred to [place] and was deemed not to be a good candidate for liver transplantation. -he is scheduled for rpt MRI with Dr Anselm Pancoast in thenext 1-2 months for close f/u --- if broad progression will need to  Consider systemic treatment options. If [place] continues to behave indolently and remains locally targetable -- will defer to IR regarding local [place] treatment options.   FOLLOW UP: -Referral to [facility] for consideration/appropriateness of liver transplant for multifocal HCC. RTC with Dr Irene Limbo with labs in 2 months  The total time spent in the appt was 35 minutes and more than 50% was on counseling and direct patient cares.  All of the patient's questions were answered with apparent satisfaction. The patient knows to call the clinic with any problems, questions or concerns.   [name] MD MS AAHIVMS [facility] Hematology/Oncology Physician [facility]  (Office):       [phone] (Work cell):  [phone] (Fax):           [phone]  I, Scot Dock, am acting as a scribe for Dr. [name].   .I have reviewed the above documentation for accuracy and completeness, and I agree with the  above. Brunetta Genera MD

## 2019-07-29 LAB — AFP TUMOR MARKER: AFP, Serum, Tumor Marker: 11.1 ng/mL — ABNORMAL HIGH (ref 0.0–8.3)

## 2019-07-31 ENCOUNTER — Telehealth: Payer: Self-pay | Admitting: *Deleted

## 2019-07-31 ENCOUNTER — Telehealth: Payer: Self-pay | Admitting: Hematology

## 2019-07-31 NOTE — Telephone Encounter (Signed)
Scheduled appt per 12/4 los.  Spoke with pt and he is aware of the appt date and time,

## 2019-07-31 NOTE — Telephone Encounter (Signed)
Referral faxed to [facility] - release [phone]

## 2019-08-22 ENCOUNTER — Other Ambulatory Visit: Payer: Self-pay | Admitting: Diagnostic Radiology

## 2019-08-22 DIAGNOSIS — C220 Liver cell carcinoma: Secondary | ICD-10-CM

## 2019-08-28 ENCOUNTER — Ambulatory Visit: Payer: Medicare HMO | Attending: Internal Medicine

## 2019-08-28 DIAGNOSIS — I10 Essential (primary) hypertension: Secondary | ICD-10-CM | POA: Diagnosis not present

## 2019-08-28 DIAGNOSIS — Z20822 Contact with and (suspected) exposure to covid-19: Secondary | ICD-10-CM | POA: Diagnosis not present

## 2019-08-28 DIAGNOSIS — E611 Iron deficiency: Secondary | ICD-10-CM | POA: Diagnosis not present

## 2019-08-28 DIAGNOSIS — E7800 Pure hypercholesterolemia, unspecified: Secondary | ICD-10-CM | POA: Diagnosis not present

## 2019-08-28 DIAGNOSIS — E119 Type 2 diabetes mellitus without complications: Secondary | ICD-10-CM | POA: Diagnosis not present

## 2019-08-29 LAB — NOVEL CORONAVIRUS, NAA: SARS-CoV-2, NAA: DETECTED — AB

## 2019-08-30 ENCOUNTER — Telehealth: Payer: Self-pay | Admitting: Nurse Practitioner

## 2019-08-30 NOTE — Telephone Encounter (Signed)
Called to Discuss with patient about Covid symptoms and the use of bamlanivimab, a monoclonal antibody infusion for those with mild to moderate Covid symptoms and at a high risk of hospitalization.     Pt is qualified for this infusion at the [facility] infusion center due to co-morbid conditions and/or a member of an at-risk group.     Unable to reach pt  

## 2019-09-11 ENCOUNTER — Ambulatory Visit (HOSPITAL_COMMUNITY): Payer: Medicare HMO

## 2019-09-13 ENCOUNTER — Telehealth: Payer: Medicare HMO

## 2019-09-15 ENCOUNTER — Ambulatory Visit: Payer: Medicare HMO | Attending: Internal Medicine

## 2019-09-15 DIAGNOSIS — Z23 Encounter for immunization: Secondary | ICD-10-CM | POA: Insufficient documentation

## 2019-09-15 NOTE — Progress Notes (Signed)
   Covid-19 Vaccination Clinic  Name:  Ioan Landini    MRN: [id number] DOB: 13-Nov-1945  09/15/2019  Mr. Hartshorn was observed post Covid-19 immunization for 15 minutes without incidence. He was provided with Vaccine Information Sheet and instruction to access the V-Safe system.   Mr. Granieri was instructed to call 911 with any severe reactions post vaccine: [name] Kitchen Difficulty breathing  . Swelling of your face and throat  . A fast heartbeat  . A bad rash all over your body  . Dizziness and weakness    Immunizations Administered    Name Date Dose VIS Date Route   Pfizer COVID-19 Vaccine 09/15/2019  8:50 AM 0.3 mL 08/04/2019 Intramuscular   Manufacturer: [place]   Lot: [id number]   [place]: [id number]

## 2019-09-28 ENCOUNTER — Other Ambulatory Visit: Payer: Medicare HMO

## 2019-09-28 ENCOUNTER — Ambulatory Visit: Payer: Medicare HMO | Admitting: Hematology

## 2019-09-29 ENCOUNTER — Other Ambulatory Visit: Payer: Self-pay

## 2019-09-29 ENCOUNTER — Inpatient Hospital Stay (HOSPITAL_BASED_OUTPATIENT_CLINIC_OR_DEPARTMENT_OTHER): Payer: Medicare HMO | Admitting: Hematology

## 2019-09-29 ENCOUNTER — Inpatient Hospital Stay: Payer: Medicare HMO | Attending: Hematology

## 2019-09-29 VITALS — BP 151/78 | HR 73 | Temp 97.9°F | Resp 18 | Ht 72.0 in | Wt 255.0 lb

## 2019-09-29 DIAGNOSIS — B1920 Unspecified viral hepatitis C without hepatic coma: Secondary | ICD-10-CM | POA: Insufficient documentation

## 2019-09-29 DIAGNOSIS — C220 Liver cell carcinoma: Secondary | ICD-10-CM

## 2019-09-29 DIAGNOSIS — E1122 Type 2 diabetes mellitus with diabetic chronic kidney disease: Secondary | ICD-10-CM | POA: Diagnosis not present

## 2019-09-29 DIAGNOSIS — I129 Hypertensive chronic kidney disease with stage 1 through stage 4 chronic kidney disease, or unspecified chronic kidney disease: Secondary | ICD-10-CM | POA: Insufficient documentation

## 2019-09-29 DIAGNOSIS — E785 Hyperlipidemia, unspecified: Secondary | ICD-10-CM | POA: Diagnosis not present

## 2019-09-29 DIAGNOSIS — Z8619 Personal history of other infectious and parasitic diseases: Secondary | ICD-10-CM | POA: Insufficient documentation

## 2019-09-29 DIAGNOSIS — Z8505 Personal history of malignant neoplasm of liver: Secondary | ICD-10-CM | POA: Diagnosis not present

## 2019-09-29 DIAGNOSIS — D472 Monoclonal gammopathy: Secondary | ICD-10-CM

## 2019-09-29 DIAGNOSIS — D649 Anemia, unspecified: Secondary | ICD-10-CM | POA: Diagnosis not present

## 2019-09-29 LAB — CBC WITH DIFFERENTIAL/PLATELET
Abs Immature Granulocytes: 0.02 10*3/uL (ref 0.00–0.07)
Basophils Absolute: 0 10*3/uL (ref 0.0–0.1)
Basophils Relative: 1 %
Eosinophils Absolute: 0.1 10*3/uL (ref 0.0–0.5)
Eosinophils Relative: 2 %
HCT: 37.9 % — ABNORMAL LOW (ref 39.0–52.0)
Hemoglobin: 11.3 g/dL — ABNORMAL LOW (ref 13.0–17.0)
Immature Granulocytes: 0 %
Lymphocytes Relative: 28 %
Lymphs Abs: 1.7 10*3/uL (ref 0.7–4.0)
MCH: 21.5 pg — ABNORMAL LOW (ref 26.0–34.0)
MCHC: 29.8 g/dL — ABNORMAL LOW (ref 30.0–36.0)
MCV: 72.1 fL — ABNORMAL LOW (ref 80.0–100.0)
Monocytes Absolute: 0.5 10*3/uL (ref 0.1–1.0)
Monocytes Relative: 8 %
Neutro Abs: 3.6 10*3/uL (ref 1.7–7.7)
Neutrophils Relative %: 61 %
Platelets: 310 10*3/uL (ref 150–400)
RBC: 5.26 MIL/uL (ref 4.22–5.81)
RDW: 15.9 % — ABNORMAL HIGH (ref 11.5–15.5)
WBC: 6 10*3/uL (ref 4.0–10.5)
nRBC: 0 % (ref 0.0–0.2)

## 2019-09-29 LAB — CMP (CANCER CENTER ONLY)
ALT: 29 U/L (ref 0–44)
AST: 34 U/L (ref 15–41)
Albumin: 4.2 g/dL (ref 3.5–5.0)
Alkaline Phosphatase: 41 U/L (ref 38–126)
Anion gap: 11 (ref 5–15)
BUN: 22 mg/dL (ref 8–23)
CO2: 26 mmol/L (ref 22–32)
Calcium: 9.5 mg/dL (ref 8.9–10.3)
Chloride: 103 mmol/L (ref 98–111)
Creatinine: 1.67 mg/dL — ABNORMAL HIGH (ref 0.61–1.24)
GFR, Est AFR Am: 47 mL/min — ABNORMAL LOW (ref >60)
GFR, Estimated: 40 mL/min — ABNORMAL LOW (ref >60)
Glucose, Bld: 146 mg/dL — ABNORMAL HIGH (ref 70–99)
Potassium: 5 mmol/L (ref 3.5–5.1)
Sodium: 140 mmol/L (ref 135–145)
Total Bilirubin: 0.5 mg/dL (ref 0.3–1.2)
Total Protein: 8.2 g/dL — ABNORMAL HIGH (ref 6.5–8.1)

## 2019-09-29 NOTE — Progress Notes (Signed)
HEMATOLOGY/ONCOLOGY CLINIC NOTE  Date of Service: 09/29/2019  Patient Care Team: Jani Gravel, MD as PCP - General (Internal Medicine)  CHIEF COMPLAINTS/PURPOSE OF CONSULTATION:  MGUS HCC  HISTORY OF PRESENTING ILLNESS:  Aaron [name] is a wonderful 74 y.o. male who has been referred to [place] by Dr Jani Gravel for evaluation and management of monoclonal gammopathy of undetermined significance.  Patient has history of hypertension, diabetes, dyslipidemia, hepatocellular carcinoma (rx with TACE and percutaneous thermal ablation), hepatitis C status post treatment, iron deficiency anemia in 2014 treated with oral iron.  Patient had an SPEP done by his primary care physician on 10/04/2015 that showed an M spike of 0.3 g/dL. It was presumably will be done due to his complaints of fatigue. He has had no significant anemia. No new bone pains. No fevers/chills/drenching night sweats.  No new anemia..  Outside labs show hemoglobin of 12.4 with microcytosis with an MCV of 70.5, normal WBC count of 5.1k.  No evidence of hypercalcemia or significant renal failure on his outside labs.   INTERVAL HISTORY  Aaron [name]  is here for his scheduled follow-up for MGUS and HCC. The patient's last visit with [place] was on 07/28/2019. The pt reports that he is doing well overall.  The pt reports no new concerns  Lab results today (09/29/19) of CBC w/diff and CMP is as follows: all values are WNL except for Hemogloin at 11.3, HCT at 37.9, MCV at 72.1, MCH at 21.5, MCHC at 29.8, RDW at 15.9, Glucose Bld at 146, Creatinine at 1.67, Total protein at 8.2, GFR Est Non Af Am at 40, GFR Est AFR Am at 47.  On review of systems, pt reports no new concerns, eating well, and denies changes in urination, changes in bowel habits, abdominal pain, leg swelling and any other symptoms.   MEDICAL HISTORY:  Past Medical History:  Diagnosis Date  . Arthritis   . Diabetes mellitus without complication ([place])    type II   .  Elevated liver enzymes   . Fatty liver   . Hepatitis C    genotype 1A status post treatment with Viekira and Ribavarin for 24 weeks  . Hepatocellular carcinoma ([place]) 01/30/2014   Path  . History of colon polyps   . Hyperlipidemia   . Hypertension   . Iron deficiency anemia    hx of   . MGUS (monoclonal gammopathy of unknown significance)    Obesity Hepatocellular carcinoma treated with TACE and percutaneous thermal ablation by interventional radiology. Last MRI on 08/06/2015 showed slight decrease in the size of the ablation defect involving the right lobe of the liver area and no findings to suggest residual or recurrent hepatocellular carcinoma. Mild changes of liver cirrhosis. MRI Abd 06/24/2016- no evidence of recurrent HCC   Hepatitis C genotype 1A status post treatment with Viekira and Ribavarin for 24 weeks.  Monoclonal gammopathy of undetermined significance.  SURGICAL HISTORY:  Status post microwave ablation[October 2015] of liver lesion and TACE [July 2015] for [place]. EGD and colonoscopy in 2016   SOCIAL HISTORY: Social History   Socioeconomic History  . Marital status: Married    Spouse name: Not on file  . Number of children: Not on file  . Years of education: Not on file  . Highest education level: Not on file  Occupational History  . Not on file  Tobacco Use  . Smoking status: Former Smoker    Quit date: 01/30/1994    Years since quitting: 25.6  .  Smokeless tobacco: Never Used  . Tobacco comment: stopped 25 yrs ago  Substance and Sexual Activity  . Alcohol use: No    Comment: stopped 30 years ago   . Drug use: No  . Sexual activity: Not Currently  Other Topics Concern  . Not on file  Social History Narrative  . Not on file   Social Determinants of Health   Financial Resource Strain:   . Difficulty of Paying Living Expenses: Not on file  Food Insecurity:   . Worried About Charity fundraiser in the Last Year: Not on file  . Ran Out of Food in the Last  Year: Not on file  Transportation Needs:   . Lack of Transportation (Medical): Not on file  . Lack of Transportation (Non-Medical): Not on file  Physical Activity:   . Days of Exercise per Week: Not on file  . Minutes of Exercise per Session: Not on file  Stress:   . Feeling of Stress : Not on file  Social Connections:   . Frequency of Communication with Friends and Family: Not on file  . Frequency of Social Gatherings with Friends and Family: Not on file  . Attends Religious Services: Not on file  . Active Member of Clubs or Organizations: Not on file  . Attends Archivist Meetings: Not on file  . Marital Status: Not on file  Intimate Partner Violence:   . Fear of Current or Ex-Partner: Not on file  . Emotionally Abused: Not on file  . Physically Abused: Not on file  . Sexually Abused: Not on file  Former smoker and smoked 1 pack per day for about 20 years starting at age 36 quit 64 years ago.  FAMILY HISTORY: No family history on file.  ALLERGIES:  has No Known Allergies.  MEDICATIONS:  Current Outpatient Medications  Medication Sig Dispense Refill  . ALPRAZolam (XANAX) 0.5 MG tablet Take 1 tablet (0.5 mg total) by mouth as directed. Take 2 tablets (1.0 mg total) prior to MR scan.  Make take an additional 0.5 mg po if needed. (Patient not taking: Reported on 07/28/2019) 3 tablet 0  . aspirin EC 81 MG tablet Take 81 mg by mouth every morning.     . cholecalciferol (VITAMIN D) 1000 UNITS tablet Take 1,000 Units by mouth every morning.     . Insulin Glargine (LANTUS SOLOSTAR) 100 UNIT/ML Solostar Pen Inject 32 Units into the skin daily.    [name] Kitchen lisinopril-hydrochlorothiazide (PRINZIDE,ZESTORETIC) 20-12.5 MG tablet Take 1 tablet by mouth every morning.     . metFORMIN (GLUCOPHAGE) 1000 MG tablet Take 1,000 mg by mouth 2 (two) times daily with a meal.    . Multiple Vitamin (MULTIVITAMIN WITH MINERALS) TABS tablet Take 1 tablet daily by mouth.    . oxyCODONE (OXY  IR/ROXICODONE) 5 MG immediate release tablet Take 1 tablet (5 mg total) by mouth every 6 (six) hours as needed for up to 15 doses for moderate pain or severe pain. (Patient not taking: Reported on 07/28/2019) 15 tablet 0  . PAZEO 0.7 % SOLN Place 1 drop into both eyes every morning.   3  . simvastatin (ZOCOR) 40 MG tablet Take 20 mg by mouth every evening.      No current facility-administered medications for this visit.    REVIEW OF SYSTEMS:   A 10+ POINT REVIEW OF SYSTEMS WAS OBTAINED including neurology, dermatology, psychiatry, cardiac, respiratory, lymph, extremities, GI, GU, Musculoskeletal, constitutional, breasts, reproductive, HEENT.  All pertinent positives are  noted in the HPI.  All others are negative.    PHYSICAL EXAMINATION: ECOG FS:1 - Symptomatic but completely ambulatory  Vitals:   09/29/19 1002  BP: (!) 151/78  Pulse: 73  Resp: 18  Temp: 97.9 F (36.6 C)  SpO2: 97%   Wt Readings from Last 3 Encounters:  09/29/19 255 lb (115.7 kg)  07/28/19 264 lb 14.4 oz (120.2 kg)  05/16/19 272 lb (123.4 kg)   Body mass index is 34.58 kg/m.    GENERAL:alert, in no acute distress and comfortable SKIN: no acute rashes, no significant lesions EYES: conjunctiva are pink and non-injected, sclera anicteric OROPHARYNX: MMM, no exudates, no oropharyngeal erythema or ulceration NECK: supple, no JVD LYMPH:  no palpable lymphadenopathy in the cervical, axillary or inguinal regions LUNGS: clear to auscultation b/l with normal respiratory effort HEART: regular rate & rhythm ABDOMEN:  normoactive bowel sounds , non tender, not distended. Extremity: no pedal edema PSYCH: alert & oriented x 3 with fluent speech NEURO: no focal motor/sensory deficits    LABORATORY DATA:  I have reviewed the data as listed  . CBC Latest Ref Rng & Units 09/29/2019 07/28/2019 05/16/2019  WBC 4.0 - 10.5 K/uL 6.0 4.9 5.0  Hemoglobin 13.0 - 17.0 g/dL 11.3(L) 11.3(L) 11.5(L)  Hematocrit 39.0 - 52.0 %  37.9(L) 37.9(L) 39.3  Platelets 150 - 400 K/uL 310 276 278   . CBC    Component Value Date/Time   WBC 6.0 09/29/2019 0909   RBC 5.26 09/29/2019 0909   HGB 11.3 (L) 09/29/2019 0909   HGB 11.3 (L) 07/28/2019 0836   HGB 11.5 (L) 03/22/2017 1402   HCT 37.9 (L) 09/29/2019 0909   HCT 37.0 (L) 03/22/2017 1402   PLT 310 09/29/2019 0909   PLT 276 07/28/2019 0836   PLT 262 03/22/2017 1402   MCV 72.1 (L) 09/29/2019 0909   MCV 72.3 (L) 03/22/2017 1402   MCH 21.5 (L) 09/29/2019 0909   MCHC 29.8 (L) 09/29/2019 0909   RDW 15.9 (H) 09/29/2019 0909   RDW 15.6 (H) 03/22/2017 1402   LYMPHSABS 1.7 09/29/2019 0909   LYMPHSABS 1.8 03/22/2017 1402   MONOABS 0.5 09/29/2019 0909   MONOABS 0.4 03/22/2017 1402   EOSABS 0.1 09/29/2019 0909   EOSABS 0.1 03/22/2017 1402   BASOSABS 0.0 09/29/2019 0909   BASOSABS 0.0 03/22/2017 1402    . CMP Latest Ref Rng & Units 09/29/2019 07/28/2019 05/16/2019  Glucose 70 - 99 mg/dL 146(H) 160(H) 178(H)  BUN 8 - 23 mg/dL 22 26(H) 25(H)  Creatinine 0.61 - 1.24 mg/dL 1.67(H) 1.67(H) 1.44(H)  Sodium 135 - 145 mmol/L 140 141 137  Potassium 3.5 - 5.1 mmol/L 5.0 5.5(H) 4.0  Chloride 98 - 111 mmol/L 103 106 100  CO2 22 - 32 mmol/L 26 25 25   Calcium 8.9 - 10.3 mg/dL 9.5 9.7 9.4  Total Protein 6.5 - 8.1 g/dL 8.2(H) 8.1 8.5(H)  Total Bilirubin 0.3 - 1.2 mg/dL 0.5 0.3 0.6  Alkaline Phos 38 - 126 U/L 41 48 49  AST 15 - 41 U/L 34 20 29  ALT 0 - 44 U/L 29 18 22     06/21/2019 MR ABDOMEN WWO CONTRAST (Accession [id number])     IFE 1  Comment   Comments: Immunofixation shows IgG monoclonal protein with kappa light chain  specificity.           RADIOGRAPHIC STUDIES:  MRI abd w and wo contrast: 12/17/2016: IMPRESSION: 1. Postprocedural changes of thermal ablation again noted in the right lobe of the  liver, without definitive evidence to suggest local recurrence of disease. No new hepatic lesions are noted. 2. Additional incidental findings, as  above.   Electronically Signed   By: Vinnie Langton M.D.   On: 12/17/2016 09:42  ASSESSMENT & PLAN:   74 y.o. male with  #1 Monoclonal gammopathy of undetermined significance.  M protein 0.2 mg/dL immunofixation showing IgG monoclonal protein with kappa light chain specificity. SPEP 02/2016 - 0.3g/dl  No overt anemia.  No overt hypercalcemia. Stable CKD creatinine 1.6  #2 bone lucencies in the right radial mid midshaft and left humeral head. PET/CT did not show any hypermetabolic bone lesions. MRI left shoulder showed no evidence of metastatic disease or multiple myeloma. The x-ray findings appear to be areas of mild demineralization. Patient was seen by orthopedics and no additional recommendations were given.  Plan -Patient's anemia is stable. No significant change in renal function. No new bone pains. No hypercalcemia . No overall no overt evidence of progression of multiple myeloma. -Myeloma labs from today show stable M protein @ 0.2g/dl with no evidence of progression- consistent with MGUS.  #3 Hepatocellular carcinoma treated with TACE and percutaneous thermal ablation by interventional radiology.   Last MRI on 08/06/2015 showed slight decrease in the size of the ablation defect involving the right lobe of the liver area and no findings to suggest residual or recurrent hepatocellular carcinoma. Mild changes of liver cirrhosis.  01/16/2016 MRI Abdomen showed resolution of previously ablated lesion but a new 1.3 cm lesion was noted which was subsequently ablated under CT guidance by interventional radiology on 01/31/2016. Elevated transaminases on 02/01/2016 were likely related to his ablation. These have since resolved.   06/24/2016 MRI Abdomen showed no evidence of recurrent hepatocellular carcinoma . 12/17/2016 MRI Abdomen shows no evidence of recurrent hepatocellular carcinoma .  10/26/17 MRI Abdomen revealed Motion degraded images. Status post thermal ablation of the segment 8  lesion, without enhancement to suggest residual viable tumor. Prior ablation of a segment 7 lesion, grossly unchanged. No convincing central enhancement  03/14/2019  MRI abdomen w and wo contrast revealed "1. New areas of restricted diffusion, surrounding nodular arterial phase enhancement and delayed washout surrounding the ablation zone defects anteriorly in segments 8 and 7 consistent with local recurrence of hepatocellular carcinoma. 2. The peripheral ablation zone defect laterally in segment 8 is unchanged. 3. No extrahepatic tumor identified."  #4 Hepatitis C genotype 1A status post treatment with Viekira and Ribavarin for 24 weeks.  Plan -continue f/u with IR-Dr. Markus Daft for ongoing management of [place].   - AFP tumor marker from today wnl at 1.9. -Continue follow-up and hepatitis C clinic for continued monitoring and management of his hepatitis C.  #5 Microcytosis with Minimal Anemia - Hgb electrophoresis suggestive of Beta thal trait given relative polycythemia. Some element of iron defi Iron sat 18%, ferritin 88 but could be over estimate in the setting of some liver injury Ferritin level stable at 77 today.  PLAN:  -Discussed pt labwork today, 09/29/19;  all values are WNL except for Hemogloin at 11.3, HCT at 37.9, MCV at 72.1, MCH at 21.5, MCHC at 29.8, RDW at 15.9, Glucose Bld at 146, Creatinine at 1.67, Total protein at 8.2, GFR Est Non Af Am at 40, GFR Est AFR Am at 47. -Discussed 07/28/2019 elevated AFP at 11.1. Pending today's AFP. -Discussed COVID vaccine -Discussed with pt that his current medical approach is not curative -he was referred to [place] and was deemed not to  be a good candidate for liver transplantation. -he is scheduled for rpt MRI with Dr Anselm Pancoast next week for close f/u  - if broad progression will need to Consider systemic treatment options. If [place] continues to behave indolently and remains locally targetable will defer to IR regarding local [place]  treatment options.  FOLLOW UP: RTC with Dr Irene Limbo in 2 months with labs  The total time spent in the appt was 30 minutes and more than 50% was on counseling and direct patient cares.  All of the patient's questions were answered with apparent satisfaction. The patient knows to call the clinic with any problems, questions or concerns.    [name] MD MS AAHIVMS [facility] Hematology/Oncology Physician [facility]  (Office):       [phone] (Work cell):  [phone] (Fax):           [phone]  I, Scot Dock, am acting as a scribe for Dr. [name].   .I have reviewed the above documentation for accuracy and completeness, and I agree with the above. Brunetta Genera MD

## 2019-09-30 LAB — AFP TUMOR MARKER: AFP, Serum, Tumor Marker: 84.5 ng/mL — ABNORMAL HIGH (ref 0.0–8.3)

## 2019-10-02 ENCOUNTER — Other Ambulatory Visit: Payer: Self-pay

## 2019-10-02 ENCOUNTER — Ambulatory Visit (HOSPITAL_COMMUNITY)
Admission: RE | Admit: 2019-10-02 | Discharge: 2019-10-02 | Disposition: A | Discharge status: Home / Self Care | Payer: Medicare HMO | Source: Ambulatory Visit | Attending: Diagnostic Radiology | Admitting: Diagnostic Radiology

## 2019-10-02 DIAGNOSIS — C220 Liver cell carcinoma: Secondary | ICD-10-CM | POA: Diagnosis not present

## 2019-10-02 MED ORDER — GADOBUTROL 1 MMOL/ML IV SOLN
10.0000 mL | Freq: Once | INTRAVENOUS | Status: AC | PRN
  Administered 2019-10-02: 10 mL via INTRAVENOUS

## 2019-10-03 ENCOUNTER — Telehealth: Payer: Self-pay | Admitting: Hematology

## 2019-10-03 NOTE — Telephone Encounter (Signed)
Scheduled per 02/05 los, patient has been called and notified. 

## 2019-10-06 ENCOUNTER — Telehealth: Payer: Self-pay | Admitting: Hematology

## 2019-10-06 ENCOUNTER — Ambulatory Visit: Payer: Medicare HMO | Attending: Internal Medicine

## 2019-10-06 DIAGNOSIS — Z23 Encounter for immunization: Secondary | ICD-10-CM | POA: Insufficient documentation

## 2019-10-06 NOTE — Telephone Encounter (Signed)
Scheduled appt per 2/10 sch message - pt aware of appt date and time

## 2019-10-06 NOTE — Progress Notes (Signed)
   Covid-19 Vaccination Clinic  Name:  Aaron [name]    MRN: [id number] DOB: [birth year]  10/06/2019  Mr. Carchi was observed post Covid-19 immunization for 15 minutes without incidence. He was provided with Vaccine Information Sheet and instruction to access the V-Safe system.   Mr. Mooneyhan was instructed to call 911 with any severe reactions post vaccine: [name] Kitchen Difficulty breathing  . Swelling of your face and throat  . A fast heartbeat  . A bad rash all over your body  . Dizziness and weakness    Immunizations Administered    Name Date Dose VIS Date Route   Pfizer COVID-19 Vaccine 10/06/2019  8:53 AM 0.3 mL 08/04/2019 Intramuscular   Manufacturer: [place]   Lot: [id number]   [place]: [id number]

## 2019-10-10 ENCOUNTER — Encounter: Payer: Self-pay | Admitting: *Deleted

## 2019-10-10 ENCOUNTER — Other Ambulatory Visit: Payer: Self-pay

## 2019-10-10 ENCOUNTER — Inpatient Hospital Stay (HOSPITAL_BASED_OUTPATIENT_CLINIC_OR_DEPARTMENT_OTHER): Payer: Medicare HMO | Admitting: Hematology

## 2019-10-10 ENCOUNTER — Ambulatory Visit
Admission: RE | Admit: 2019-10-10 | Discharge: 2019-10-10 | Disposition: A | Discharge status: Home / Self Care | Payer: Medicare HMO | Source: Ambulatory Visit | Attending: Diagnostic Radiology | Admitting: Diagnostic Radiology

## 2019-10-10 DIAGNOSIS — C220 Liver cell carcinoma: Secondary | ICD-10-CM

## 2019-10-10 HISTORY — PX: IR RADIOLOGIST EVAL & MGMT: IMG5224

## 2019-10-10 NOTE — Progress Notes (Signed)
HEMATOLOGY/ONCOLOGY CLINIC NOTE  Date of Service: 10/10/2019  Patient Care Team: Jani Gravel, MD as PCP - General (Internal Medicine)  CHIEF COMPLAINTS/PURPOSE OF CONSULTATION:   [place]- progression  HISTORY OF PRESENTING ILLNESS:  Aaron [name] is a wonderful 74 y.o. male who has been referred to [place] by Dr Jani Gravel for evaluation and management of monoclonal gammopathy of undetermined significance.  Patient has history of hypertension, diabetes, dyslipidemia, hepatocellular carcinoma (rx with TACE and percutaneous thermal ablation), hepatitis C status post treatment, iron deficiency anemia in 2014 treated with oral iron.  Patient had an SPEP done by his primary care physician on 10/04/2015 that showed an M spike of 0.3 g/dL. It was presumably will be done due to his complaints of fatigue. He has had no significant anemia. No new bone pains. No fevers/chills/drenching night sweats.  No new anemia..  Outside labs show hemoglobin of 12.4 with microcytosis with an MCV of 70.5, normal WBC count of 5.1k.  No evidence of hypercalcemia or significant renal failure on his outside labs.   INTERVAL HISTORY I connected with  [name] on 10/10/19 by telephone and verified that I am speaking with the correct person using two identifiers.   I discussed the limitations of evaluation and management by telemedicine. The patient expressed understanding and agreed to proceed.  Other persons participating in the visit and their role in the encounter:      -Yevette Edwards, Medical Scribe  Patient's location: Home Provider's location: [facility]  is here for his scheduled follow-up for MGUS and HCC. The patient's last visit with [place] was on 09/29/2019. The pt reports that he is doing well overall.  The pt reports that he has been good and has had no new concerns in the interim. Pt has not history of blood clots, heart attacks, or strokes. He is not currently following with  an infectious disease physician. He was last treated for his Hepatitis C over a year ago by a [facility] associated physician.   Of note since the patient's last visit, pt has had MRI Abdomen ([id number]) completed on 10/02/2019 with results revealing "1. Progressive multifocal hepatocellular carcinoma, especially anteriorly around the ablated lesion in segments 4B and 8. There are additional new lesions in segments 2 and 3. 2.   Lab results (09/29/19) of CBC w/diff and CMP is as follows: all values are WNL except for Hgb at 11.3, HCT at 37.9, MCV at 72.1, MCH at 21.5, MCHC at 29.8, RDW at 15.9, Glucose at 146, Creatinine at 1.67, Total Protein at 8.2, GFR Est Af Am at 47. 09/29/2019 AFP at 84.5  On review of systems, pt denies any other symptoms.   MEDICAL HISTORY:  Past Medical History:  Diagnosis Date  . Arthritis   . Diabetes mellitus without complication ([place])    type II   . Elevated liver enzymes   . Fatty liver   . Hepatitis C    genotype 1A status post treatment with Viekira and Ribavarin for 24 weeks  . Hepatocellular carcinoma ([place]) 01/30/2014   Path  . History of colon polyps   . Hyperlipidemia   . Hypertension   . Iron deficiency anemia    hx of   . MGUS (monoclonal gammopathy of unknown significance)    Obesity Hepatocellular carcinoma treated with TACE and percutaneous thermal ablation by interventional radiology. Last MRI on 08/06/2015 showed slight decrease in the size of the ablation defect involving the right lobe  of the liver area and no findings to suggest residual or recurrent hepatocellular carcinoma. Mild changes of liver cirrhosis. MRI Abd 06/24/2016- no evidence of recurrent HCC   Hepatitis C genotype 1A status post treatment with Viekira and Ribavarin for 24 weeks.  Monoclonal gammopathy of undetermined significance.  SURGICAL HISTORY:  Status post microwave ablation[October 2015] of liver lesion and TACE [July 2015] for [place]. EGD and colonoscopy in  2016   SOCIAL HISTORY: Social History   Socioeconomic History  . Marital status: Married    Spouse name: Not on file  . Number of children: Not on file  . Years of education: Not on file  . Highest education level: Not on file  Occupational History  . Not on file  Tobacco Use  . Smoking status: Former Smoker    Quit date: 01/30/1994    Years since quitting: 25.7  . Smokeless tobacco: Never Used  . Tobacco comment: stopped 25 yrs ago  Substance and Sexual Activity  . Alcohol use: No    Comment: stopped 30 years ago   . Drug use: No  . Sexual activity: Not Currently  Other Topics Concern  . Not on file  Social History Narrative  . Not on file   Social Determinants of Health   Financial Resource Strain:   . Difficulty of Paying Living Expenses: Not on file  Food Insecurity:   . Worried About Charity fundraiser in the Last Year: Not on file  . Ran Out of Food in the Last Year: Not on file  Transportation Needs:   . Lack of Transportation (Medical): Not on file  . Lack of Transportation (Non-Medical): Not on file  Physical Activity:   . Days of Exercise per Week: Not on file  . Minutes of Exercise per Session: Not on file  Stress:   . Feeling of Stress : Not on file  Social Connections:   . Frequency of Communication with Friends and Family: Not on file  . Frequency of Social Gatherings with Friends and Family: Not on file  . Attends Religious Services: Not on file  . Active Member of Clubs or Organizations: Not on file  . Attends Archivist Meetings: Not on file  . Marital Status: Not on file  Intimate Partner Violence:   . Fear of Current or Ex-Partner: Not on file  . Emotionally Abused: Not on file  . Physically Abused: Not on file  . Sexually Abused: Not on file  Former smoker and smoked 1 pack per day for about 20 years starting at age 88 quit 75 years ago.  FAMILY HISTORY: No family history on file.  ALLERGIES:  has No Known  Allergies.  MEDICATIONS:  Current Outpatient Medications  Medication Sig Dispense Refill  . ALPRAZolam (XANAX) 0.5 MG tablet Take 1 tablet (0.5 mg total) by mouth as directed. Take 2 tablets (1.0 mg total) prior to MR scan.  Make take an additional 0.5 mg po if needed. (Patient not taking: Reported on 07/28/2019) 3 tablet 0  . aspirin EC 81 MG tablet Take 81 mg by mouth every morning.     . cholecalciferol (VITAMIN D) 1000 UNITS tablet Take 1,000 Units by mouth every morning.     . Insulin Glargine (LANTUS SOLOSTAR) 100 UNIT/ML Solostar Pen Inject 32 Units into the skin daily.    [name] Kitchen lisinopril-hydrochlorothiazide (PRINZIDE,ZESTORETIC) 20-12.5 MG tablet Take 1 tablet by mouth every morning.     . metFORMIN (GLUCOPHAGE) 1000 MG tablet Take 1,000 mg  by mouth 2 (two) times daily with a meal.    . Multiple Vitamin (MULTIVITAMIN WITH MINERALS) TABS tablet Take 1 tablet daily by mouth.    . oxyCODONE (OXY IR/ROXICODONE) 5 MG immediate release tablet Take 1 tablet (5 mg total) by mouth every 6 (six) hours as needed for up to 15 doses for moderate pain or severe pain. (Patient not taking: Reported on 07/28/2019) 15 tablet 0  . PAZEO 0.7 % SOLN Place 1 drop into both eyes every morning.   3  . simvastatin (ZOCOR) 40 MG tablet Take 20 mg by mouth every evening.      No current facility-administered medications for this visit.    REVIEW OF SYSTEMS:   A 10+ POINT REVIEW OF SYSTEMS WAS OBTAINED including neurology, dermatology, psychiatry, cardiac, respiratory, lymph, extremities, GI, GU, Musculoskeletal, constitutional, breasts, reproductive, HEENT.  All pertinent positives are noted in the HPI.  All others are negative.   PHYSICAL EXAMINATION: ECOG FS:1 - Symptomatic but completely ambulatory  There were no vitals filed for this visit. Wt Readings from Last 3 Encounters:  09/29/19 255 lb (115.7 kg)  07/28/19 264 lb 14.4 oz (120.2 kg)  05/16/19 272 lb (123.4 kg)   There is no height or weight on file  to calculate BMI.    Telehealth visit  LABORATORY DATA:  I have reviewed the data as listed  . CBC Latest Ref Rng & Units 09/29/2019 07/28/2019 05/16/2019  WBC 4.0 - 10.5 K/uL 6.0 4.9 5.0  Hemoglobin 13.0 - 17.0 g/dL 11.3(L) 11.3(L) 11.5(L)  Hematocrit 39.0 - 52.0 % 37.9(L) 37.9(L) 39.3  Platelets 150 - 400 K/uL 310 276 278   . CBC    Component Value Date/Time   WBC 6.0 09/29/2019 0909   RBC 5.26 09/29/2019 0909   HGB 11.3 (L) 09/29/2019 0909   HGB 11.3 (L) 07/28/2019 0836   HGB 11.5 (L) 03/22/2017 1402   HCT 37.9 (L) 09/29/2019 0909   HCT 37.0 (L) 03/22/2017 1402   PLT 310 09/29/2019 0909   PLT 276 07/28/2019 0836   PLT 262 03/22/2017 1402   MCV 72.1 (L) 09/29/2019 0909   MCV 72.3 (L) 03/22/2017 1402   MCH 21.5 (L) 09/29/2019 0909   MCHC 29.8 (L) 09/29/2019 0909   RDW 15.9 (H) 09/29/2019 0909   RDW 15.6 (H) 03/22/2017 1402   LYMPHSABS 1.7 09/29/2019 0909   LYMPHSABS 1.8 03/22/2017 1402   MONOABS 0.5 09/29/2019 0909   MONOABS 0.4 03/22/2017 1402   EOSABS 0.1 09/29/2019 0909   EOSABS 0.1 03/22/2017 1402   BASOSABS 0.0 09/29/2019 0909   BASOSABS 0.0 03/22/2017 1402    . CMP Latest Ref Rng & Units 09/29/2019 07/28/2019 05/16/2019  Glucose 70 - 99 mg/dL 146(H) 160(H) 178(H)  BUN 8 - 23 mg/dL 22 26(H) 25(H)  Creatinine 0.61 - 1.24 mg/dL 1.67(H) 1.67(H) 1.44(H)  Sodium 135 - 145 mmol/L 140 141 137  Potassium 3.5 - 5.1 mmol/L 5.0 5.5(H) 4.0  Chloride 98 - 111 mmol/L 103 106 100  CO2 22 - 32 mmol/L 26 25 25   Calcium 8.9 - 10.3 mg/dL 9.5 9.7 9.4  Total Protein 6.5 - 8.1 g/dL 8.2(H) 8.1 8.5(H)  Total Bilirubin 0.3 - 1.2 mg/dL 0.5 0.3 0.6  Alkaline Phos 38 - 126 U/L 41 48 49  AST 15 - 41 U/L 34 20 29  ALT 0 - 44 U/L 29 18 22     06/21/2019 MR ABDOMEN WWO CONTRAST (Accession [id number])     IFE 1  Comment  Comments: Immunofixation shows IgG monoclonal protein with kappa light chain  specificity.           RADIOGRAPHIC STUDIES:  MRI abd w and wo contrast:  12/17/2016: IMPRESSION: 1. Postprocedural changes of thermal ablation again noted in the right lobe of the liver, without definitive evidence to suggest local recurrence of disease. No new hepatic lesions are noted. 2. Additional incidental findings, as above.   Electronically Signed   By: Vinnie Langton M.D.   On: 12/17/2016 09:42  ASSESSMENT & PLAN:   74 y.o. male with  #1 Monoclonal gammopathy of undetermined significance.  M protein 0.2 mg/dL immunofixation showing IgG monoclonal protein with kappa light chain specificity. SPEP 02/2016 - 0.3g/dl  No overt anemia.  No overt hypercalcemia. Stable CKD creatinine 1.6  #2 bone lucencies in the right radial mid midshaft and left humeral head. PET/CT did not show any hypermetabolic bone lesions. MRI left shoulder showed no evidence of metastatic disease or multiple myeloma. The x-ray findings appear to be areas of mild demineralization. Patient was seen by orthopedics and no additional recommendations were given.  Plan -Patient's anemia is stable. No significant change in renal function. No new bone pains. No hypercalcemia . No overall no overt evidence of progression of multiple myeloma. -Myeloma labs from today show stable M protein @ 0.2g/dl with no evidence of progression- consistent with MGUS.  #3 Hepatocellular carcinoma treated with TACE and percutaneous thermal ablation by interventional radiology.   Last MRI on 08/06/2015 showed slight decrease in the size of the ablation defect involving the right lobe of the liver area and no findings to suggest residual or recurrent hepatocellular carcinoma. Mild changes of liver cirrhosis.  01/16/2016 MRI Abdomen showed resolution of previously ablated lesion but a new 1.3 cm lesion was noted which was subsequently ablated under CT guidance by interventional radiology on 01/31/2016. Elevated transaminases on 02/01/2016 were likely related to his ablation. These have since resolved.    06/24/2016 MRI Abdomen showed no evidence of recurrent hepatocellular carcinoma . 12/17/2016 MRI Abdomen shows no evidence of recurrent hepatocellular carcinoma .  10/26/17 MRI Abdomen revealed Motion degraded images. Status post thermal ablation of the segment 8 lesion, without enhancement to suggest residual viable tumor. Prior ablation of a segment 7 lesion, grossly unchanged. No convincing central enhancement  03/14/2019  MRI abdomen w and wo contrast revealed "1. New areas of restricted diffusion, surrounding nodular arterial phase enhancement and delayed washout surrounding the ablation zone defects anteriorly in segments 8 and 7 consistent with local recurrence of hepatocellular carcinoma. 2. The peripheral ablation zone defect laterally in segment 8 is unchanged. 3. No extrahepatic tumor identified."  #4 Hepatitis C genotype 1A status post treatment with Viekira and Ribavarin for 24 weeks.  Plan -continue f/u with IR-Dr. Markus Daft for ongoing management of [place].   - AFP tumor marker from today wnl at 1.9. -Continue follow-up and hepatitis C clinic for continued monitoring and management of his hepatitis C.  #5 Microcytosis with Minimal Anemia - Hgb electrophoresis suggestive of Beta thal trait given relative polycythemia. Some element of iron defi Iron sat 18%, ferritin 88 but could be over estimate in the setting of some liver injury Ferritin level stable at 77 today.  PLAN:  -Discussed pt labwork, 09/29/19; blood counts and chemistries are steady -Discussed 09/29/2019 AFP elevated significantly at 84.5 -Discussed 10/02/2019 MRI Abdomen ([id number]) which revealed "1. Progressive multifocal hepatocellular carcinoma, especially anteriorly around the ablated lesion in segments 4B and 8. There are additional  new lesions in segments 2 and 3. No evidence of extrahepatic metastatic disease." -Advised pt that AFP elevation and Abdominal MRI show evidence of liver cancer progression -Advised pt  that Dr. Anselm Pancoast believes that the area of his hepatic growths are too large to ablate at this time. Will consider IV treatment until disease can be shrunken enough to treat locally with ablation or direct Y90 embolization. -Will recommend Immunotherapy (Atezolizumab) in combination with Avastin at this time -Also discussed using a TKI - would not prefer at this time  -Advised pt that the risk associated with Atezolizumab would by thyroid dysfunction and diarrhea and other potential autoimmune like side effects and that the risks associated with Avastin are blood clots and heart attacks -Pt currently takes ASA - advised to continue at this time -Total criteria points total 5, which makes his Child Pugh score a class A  -Will recheck Hep C viral load  -Will set up chemotherapy education  -Will begin Atezolizumab + Avastin  -Recommended pt continue to f/u with Dr Anselm Pancoast    FOLLOW UP: Chemo-counseling for Atezolizumab + Avastin Plan to start rx in 1-2 weeks RTC with labs with Dr Irene Limbo in 7-10 days post 1st treatment for a toxicity check   The total time spent in the appt was 25 minutes and more than 50% was on counseling and direct patient cares.  All of the patient's questions were answered with apparent satisfaction. The patient knows to call the clinic with any problems, questions or concerns.   [name] MD [place] AAHIVMS [facility] Hematology/Oncology Physician [facility]  (Office):       [phone] (Work cell):  [phone] (Fax):           [phone]  I, Yevette Edwards, am acting as a scribe for Dr. [name].   .I have reviewed the above documentation for accuracy and completeness, and I agree with the above. Brunetta Genera MD

## 2019-10-10 NOTE — Progress Notes (Signed)
Chief Complaint: Patient was consulted remotely today (TeleHealth) for follow-up of [place] at the request of Kizzi Overbey.    Referring Physician(s): [name]  History of Present Illness: Aaron [name] is a 74 y.o. male with history of hepatitis C, hepatocellular carcinoma and monoclonal gammopathy of unknown significance. Hepatocellular carcinoma was diagnosed in 2015.  The initial lesion was treated with a combination of DEB-TACE and microwave ablation.  Patient underwent additional microwave ablations for recurrent disease and new lesions.  Most recently, the patient underwent DEB-TACE procedure on 04/04/2019 and bland embolization procedure on 05/16/2019.  Patient was last seen on 06/28/2019.  At that time, there was concern for another new lesion in segment 7.  It was also difficult to exclude new or recurrent disease around the treated lesions.  It was decided that we would continue surveillance and get a follow-up MRI in 3 months.  Unfortunately, the follow-up MRI was slightly delayed because the patient tested positive for COVID-19.  Patient had minimal symptoms associated the COVID-19.  Patient has subsequently had follow-up MRI and AFP level.  Patient has been intentionally trying to lose weight by modifying his diet and decreasing carbohydrates.  Patient has insulin-dependent diabetes.  He has been losing approximately 3 pounds per month by skipping lunch and decreasing his carbohydrate intake.  He says that his glucose control has markedly improved.  He continues to stay active.  He denies chest symptoms or abdominal pain.  He has some diarrhea associated with milk products.  Otherwise, no significant GI problems.  He says that he has some issues urinating which he attributes to his prostate.  Past Medical History:  Diagnosis Date   Arthritis    Diabetes mellitus without complication ([place])    type II    Elevated liver enzymes    Fatty liver    Hepatitis C    genotype 1A status  post treatment with Linzie Collin and Ribavarin for 24 weeks   Hepatocellular carcinoma ([place]) 01/30/2014   Path   History of colon polyps    Hyperlipidemia    Hypertension    Iron deficiency anemia    hx of    MGUS (monoclonal gammopathy of unknown significance)     Past Surgical History:  Procedure Laterality Date   COLONOSCOPY     ESOPHAGOGASTRODUODENOSCOPY  2016   IR ANGIOGRAM SELECTIVE EACH ADDITIONAL VESSEL  04/04/2019   IR ANGIOGRAM SELECTIVE EACH ADDITIONAL VESSEL  04/04/2019   IR ANGIOGRAM SELECTIVE EACH ADDITIONAL VESSEL  04/04/2019   IR ANGIOGRAM SELECTIVE EACH ADDITIONAL VESSEL  04/04/2019   IR ANGIOGRAM SELECTIVE EACH ADDITIONAL VESSEL  04/04/2019   IR ANGIOGRAM SELECTIVE EACH ADDITIONAL VESSEL  04/04/2019   IR ANGIOGRAM SELECTIVE EACH ADDITIONAL VESSEL  04/04/2019   IR ANGIOGRAM SELECTIVE EACH ADDITIONAL VESSEL  05/16/2019   IR ANGIOGRAM SELECTIVE EACH ADDITIONAL VESSEL  05/16/2019   IR ANGIOGRAM SELECTIVE EACH ADDITIONAL VESSEL  05/16/2019   IR ANGIOGRAM VISCERAL SELECTIVE  04/04/2019   IR ANGIOGRAM VISCERAL SELECTIVE  05/16/2019   IR EMBO TUMOR ORGAN ISCHEMIA INFARCT INC GUIDE ROADMAPPING  04/04/2019   IR EMBO TUMOR ORGAN ISCHEMIA INFARCT INC GUIDE ROADMAPPING  05/16/2019   IR GENERIC HISTORICAL  02/26/2016   IR RADIOLOGIST EVAL & MGMT 02/26/2016 Markus Daft, MD [facility] INTERV RAD   IR GENERIC HISTORICAL  01/16/2016   IR RADIOLOGIST EVAL & MGMT 01/16/2016 Markus Daft, MD [facility] INTER[facility] RAD   IR GENERIC HISTORICAL  06/24/2016   IR RADIOLOGIST EVAL & MGMT 06/24/2016 Aletta Edouard, MD [facility]  INTERV RAD   IR RADIOLOGIST EVAL & MGMT  12/17/2016   IR RADIOLOGIST EVAL & MGMT  06/16/2017   IR RADIOLOGIST EVAL & MGMT  09/08/2017   IR RADIOLOGIST EVAL & MGMT  10/26/2017   IR RADIOLOGIST EVAL & MGMT  05/12/2018   IR RADIOLOGIST EVAL & MGMT  07/26/2018   IR RADIOLOGIST EVAL & MGMT  03/22/2019   IR RADIOLOGIST EVAL & MGMT  04/11/2019   IR RADIOLOGIST EVAL & MGMT  06/28/2019   IR  US GUIDE VASC ACCESS LEFT  05/16/2019   IR US GUIDE VASC ACCESS RIGHT  04/04/2019   POLYPECTOMY     RADIOFREQUENCY ABLATION N/A 07/21/2017   Procedure: CT MICROWAVE THERMAL ABLATION-LIVER;  Surgeon: Markus Daft, MD;  Location: [facility];  Service: Anesthesiology;  Laterality: N/A;   RADIOFREQUENCY ABLATION N/A 06/29/2018   Procedure: CT MICROWAVE THERMAL ABLATION;  Surgeon: Markus Daft, MD;  Location: [facility];  Service: Anesthesiology;  Laterality: N/A;    Allergies: Patient has no known allergies.  Medications: Prior to Admission medications   Medication Sig Start Date End Date Taking? Authorizing Provider  ALPRAZolam Duanne Moron) 0.5 MG tablet Take 1 tablet (0.5 mg total) by mouth as directed. Take 2 tablets (1.0 mg total) prior to MR scan.  Make take an additional 0.5 mg po if needed. Patient not taking: Reported on 07/28/2019 10/26/17   Markus Daft, MD  aspirin EC 81 MG tablet Take 81 mg by mouth every morning.     [provider]  cholecalciferol (VITAMIN D) 1000 UNITS tablet Take 1,000 Units by mouth every morning.     [provider]  Insulin Glargine (LANTUS SOLOSTAR) 100 UNIT/ML Solostar Pen Inject 32 Units into the skin daily.    [provider]  lisinopril-hydrochlorothiazide (PRINZIDE,ZESTORETIC) 20-12.5 MG tablet Take 1 tablet by mouth every morning.     [provider]  metFORMIN (GLUCOPHAGE) 1000 MG tablet Take 1,000 mg by mouth 2 (two) times daily with a meal.    [provider]  Multiple Vitamin (MULTIVITAMIN WITH MINERALS) TABS tablet Take 1 tablet daily by mouth.    [provider]  oxyCODONE (OXY IR/ROXICODONE) 5 MG immediate release tablet Take 1 tablet (5 mg total) by mouth every 6 (six) hours as needed for up to 15 doses for moderate pain or severe pain. Patient not taking: Reported on 07/28/2019 06/30/18   Candiss Norse A, PA-C  PAZEO 0.7 % SOLN Place 1 drop into both eyes every morning.  05/09/18   [provider]    simvastatin (ZOCOR) 40 MG tablet Take 20 mg by mouth every evening.     [provider]     No family history on file.  Social History   Socioeconomic History   Marital status: Married    Spouse name: Not on file   Number of children: Not on file   Years of education: Not on file   Highest education level: Not on file  Occupational History   Not on file  Tobacco Use   Smoking status: Former Smoker    Quit date: 01/30/1994    Years since quitting: 25.7   Smokeless tobacco: Never Used   Tobacco comment: stopped 25 yrs ago  Substance and Sexual Activity   Alcohol use: No    Comment: stopped 30 years ago    Drug use: No   Sexual activity: Not Currently  Other Topics Concern   Not on file  Social History Narrative   Not on file  Social Determinants of Health   Financial Resource Strain:    Difficulty of Paying Living Expenses: Not on file  Food Insecurity:    Worried About Charity fundraiser in the Last Year: Not on file   [employer] of Food in the Last Year: Not on file  Transportation Needs:    Lack of Transportation (Medical): Not on file   Lack of Transportation (Non-Medical): Not on file  Physical Activity:    Days of Exercise per Week: Not on file   Minutes of Exercise per Session: Not on file  Stress:    Feeling of Stress : Not on file  Social Connections:    Frequency of Communication with Friends and Family: Not on file   Frequency of Social Gatherings with Friends and Family: Not on file   Attends Religious Services: Not on file   Active Member of Clubs or Organizations: Not on file   Attends Archivist Meetings: Not on file   Marital Status: Not on file    Review of Systems  Constitutional: Negative.   Respiratory: Negative.   Cardiovascular: Negative.   Gastrointestinal: Positive for diarrhea.  Genitourinary: Positive for difficulty urinating.    Physical Exam No direct physical exam was  performed  Vital Signs: There were no vitals taken for this visit.  Imaging: MR ABDOMEN WWO CONTRAST  Result Date: 10/02/2019 CLINICAL DATA:  Follow-up hepatocellular carcinoma. Original diagnosis in 2015 with DEB-TACE procedure on 04/04/2019 and bland embolization procedure on 05/16/2019. EXAM: MRI ABDOMEN WITHOUT AND WITH CONTRAST TECHNIQUE: Multiplanar multisequence MR imaging of the abdomen was performed both before and after the administration of intravenous contrast. CONTRAST:  85mL GADAVIST GADOBUTROL 1 MMOL/ML IV SOLN COMPARISON:  MRI 06/13/2019 and 03/14/2019. FINDINGS: Lower chest:  The visualized lower chest appears unremarkable. Hepatobiliary: There is progressive multifocal hepatic tumor, especially anteriorly around the ablated lesion between segments 4 A and 8. These enlarging lesions demonstrate T2 hyperintensity, arterial phase enhancement and delayed washout consistent with progressive hepatocellular carcinoma. As measured on series 802, representative lesions include a 6.2 x 6.1 cm mass in segment 4B (image 53), and lesions in segment 8, measuring 2.2 x 1.9 cm on image 35 and 2.6 x 2.1 cm on image 46. In addition, there are new lesions measuring 12 mm in segment 2 (image 51) and 9 mm in segment 3 (image 62). The treated lesion peripherally in segment 7 is unchanged, without suspicious surrounding enhancement. No evidence of portal vein tumor thrombus. No evidence of gallstones, gallbladder wall thickening or biliary dilatation. Pancreas: Unremarkable. No pancreatic ductal dilatation or surrounding inflammatory changes. Spleen: Normal in size without focal abnormality. Adrenals/Urinary Tract: Both adrenal glands appear normal. Small bilateral renal cysts. No evidence of renal mass or hydronephrosis. Stomach/Bowel: No evidence of bowel wall thickening, distention or surrounding inflammatory change. Vascular/Lymphatic: Stable small lymph nodes in the porta hepatis. No significant vascular  findings. Other: No ascites or peritoneal nodularity. Musculoskeletal: No acute or significant osseous findings. IMPRESSION: 1. Progressive multifocal hepatocellular carcinoma, especially anteriorly around the ablated lesion in segments 4B and 8. There are additional new lesions in segments 2 and 3. 2. No evidence of extrahepatic metastatic disease. Electronically Signed   By: Richardean Sale M.D.   On: 10/02/2019 10:20    Labs:  CBC: Recent Labs    04/04/19 1235 05/16/19 0800 07/28/19 0836 09/29/19 0909  WBC 6.7 5.0 4.9 6.0  HGB 12.1* 11.5* 11.3* 11.3*  HCT 40.9 39.3 37.9* 37.9*  PLT 281 278  276 310    COAGS: Recent Labs    03/30/19 0741 04/04/19 1235 05/16/19 0800  INR 0.9 0.9 1.0    BMP: Recent Labs    04/05/19 0349 05/16/19 0800 07/28/19 0836 09/29/19 0909  NA 136 137 141 140  K 4.8 4.0 5.5* 5.0  CL 104 100 106 103  CO2 23 25 25 26   GLUCOSE 213* 178* 160* 146*  BUN 25* 25* 26* 22  CALCIUM 8.7* 9.4 9.7 9.5  CREATININE 1.48* 1.44* 1.67* 1.67*  GFRNONAA 47* 48* 40* 40*  GFRAA 54* 56* 47* 47*    LIVER FUNCTION TESTS: Recent Labs    04/05/19 0349 05/16/19 0800 07/28/19 0836 09/29/19 0909  BILITOT 0.5 0.6 0.3 0.5  AST 22 29 20  34  ALT 19 22 18 29   ALKPHOS 38 49 48 41  PROT 6.9 8.5* 8.1 8.2*  ALBUMIN 3.4* 4.5 4.3 4.2    TUMOR MARKERS:  AFP, tumor marker 84.5 on 09/29/2019.  AFP tumor level was 11.1 on 07/28/2019.  AFP tumor level was 0.9 on 03/30/2019  Assessment and Plan:  74 year old with hepatocellular carcinoma and undergone multiple liver directed therapy since 2015.  Unfortunately, the recent MRI demonstrates marked progression of disease in the liver with multifocal disease.  Patient's AFP level has traditionally been very low but it is now increasing with a level of 84.5.  Dr. Irene Limbo and I have been in contact about the progression of the hepatocellular carcinoma.  Dr. Irene Limbo has already contacted Mr. Shakoor about starting systemic therapy.  Patient  will be seen by oncology next week and starting treatment as soon as possible.  At this point, Dr. Irene Limbo can direct his treatment and depending on how the patient responds, we may be able to do additional liver directed therapy in the future.  Thank you for this interesting consult.  I greatly enjoyed meeting Aaron [name] and look forward to participating in their care.  A copy of this report was sent to the requesting provider on this date.  Electronically Signed: Burman Riis 10/10/2019, 10:47 AM   I spent a total of    10 Minutes in remote  clinical consultation, greater than 50% of which was counseling/coordinating care for hepatocellular carcinoma.    Visit type: Audio only (telephone). Audio (no video) only due to patient preference. Alternative for in-person consultation at [facility], [address] [place], [place], [place]. This visit type was conducted due to national recommendations for restrictions regarding the COVID-19 Pandemic (e.g. social distancing).  This format is felt to be most appropriate for this patient at this time.  All issues noted in this document were discussed and addressed.  Patient ID: Aaron [name], male   DOB: Feb 12, 1946, 74 y.o.   MRN: [id number]

## 2019-10-17 ENCOUNTER — Other Ambulatory Visit: Payer: Self-pay | Admitting: Hematology

## 2019-10-17 DIAGNOSIS — Z7189 Other specified counseling: Secondary | ICD-10-CM | POA: Insufficient documentation

## 2019-10-17 NOTE — Progress Notes (Signed)
START OFF PATHWAY REGIMEN - Other   [id number]:Atezolizumab 1,200 mg IV D1 + Bevacizumab 15 mg/kg IV D1 q21 Days:   A cycle is every 21 Days:     Atezolizumab      Bevacizumab-xxxx   **Always confirm dose/schedule in your pharmacy ordering system**  Patient Characteristics: Intent of Therapy: Non-Curative / Palliative Intent, Discussed with Patient 

## 2019-10-17 NOTE — Progress Notes (Signed)
Patient on plan of care prior to pathways. 

## 2019-10-19 ENCOUNTER — Other Ambulatory Visit: Payer: Self-pay | Admitting: *Deleted

## 2019-10-19 DIAGNOSIS — C220 Liver cell carcinoma: Secondary | ICD-10-CM

## 2019-10-20 ENCOUNTER — Telehealth: Payer: Self-pay | Admitting: Hematology

## 2019-10-20 NOTE — Telephone Encounter (Signed)
Scheduled appt per 2/22 sch message - pt is aware of appt date and time   

## 2019-10-24 ENCOUNTER — Inpatient Hospital Stay: Payer: Medicare HMO | Attending: Hematology

## 2019-10-24 ENCOUNTER — Other Ambulatory Visit: Payer: Self-pay

## 2019-10-24 DIAGNOSIS — E1122 Type 2 diabetes mellitus with diabetic chronic kidney disease: Secondary | ICD-10-CM | POA: Insufficient documentation

## 2019-10-24 DIAGNOSIS — K7460 Unspecified cirrhosis of liver: Secondary | ICD-10-CM | POA: Insufficient documentation

## 2019-10-24 DIAGNOSIS — C220 Liver cell carcinoma: Secondary | ICD-10-CM | POA: Insufficient documentation

## 2019-10-24 DIAGNOSIS — I10 Essential (primary) hypertension: Secondary | ICD-10-CM | POA: Insufficient documentation

## 2019-10-24 DIAGNOSIS — D472 Monoclonal gammopathy: Secondary | ICD-10-CM | POA: Insufficient documentation

## 2019-10-24 DIAGNOSIS — D509 Iron deficiency anemia, unspecified: Secondary | ICD-10-CM | POA: Insufficient documentation

## 2019-10-24 DIAGNOSIS — Z79899 Other long term (current) drug therapy: Secondary | ICD-10-CM | POA: Insufficient documentation

## 2019-10-24 DIAGNOSIS — N189 Chronic kidney disease, unspecified: Secondary | ICD-10-CM | POA: Insufficient documentation

## 2019-10-24 DIAGNOSIS — Z5112 Encounter for antineoplastic immunotherapy: Secondary | ICD-10-CM | POA: Insufficient documentation

## 2019-10-24 DIAGNOSIS — I129 Hypertensive chronic kidney disease with stage 1 through stage 4 chronic kidney disease, or unspecified chronic kidney disease: Secondary | ICD-10-CM | POA: Insufficient documentation

## 2019-10-24 NOTE — Progress Notes (Signed)
Pharmacist Chemotherapy Monitoring - Initial Assessment    Anticipated start date: 10/25/2019    Regimen:  . Are orders appropriate based on the patient's diagnosis, regimen, and cycle? Yes . Does the plan date match the patient's scheduled date? Yes . Is the sequencing of drugs appropriate? Yes . Are the premedications appropriate for the patient's regimen? Yes, none required . Prior Authorization for treatment is: Approved o If applicable, is the correct biosimilar selected based on the patient's insurance? Yes  Organ Function and Labs: [name] Kitchen Are dose adjustments needed based on the patient's renal function, hepatic function, or hematologic function? No . Are appropriate labs ordered prior to the start of patient's treatment? Yes . The following baseline labs, if indicated, have been ordered: atezolizumab: baseline TSH/T4 and bevacizumab: urine protein  Dose Assessment: . Are the drug doses appropriate? Yes . Are the following correct: o Drug concentrations Yes o IV fluid compatible with drug Yes o Administration routes Yes o Timing of therapy Yes . If applicable, have lifetime cumulative doses been properly documented and assessed? Yes  Toxicity Monitoring/Prevention: . Medication allergies and previous infusion related reactions, if applicable, have been reviewed and addressed. Yes . The patient's current medication list has been assessed for drug-drug interactions with their chemotherapy regimen. No significant drug-drug interactions were identified on review.  Order Verification: . Are the treatment plan orders signed? Yes . Is the patient scheduled to see a provider prior to their treatment? No  I verify that I have reviewed each item in the above checklist and answered each question accordingly.  Norwood Levo [facility] 10/24/2019 4:05 PM

## 2019-10-25 ENCOUNTER — Other Ambulatory Visit: Payer: Self-pay

## 2019-10-25 ENCOUNTER — Inpatient Hospital Stay: Payer: Medicare HMO

## 2019-10-25 VITALS — BP 175/81 | HR 64 | Temp 98.9°F | Resp 18 | Ht 72.0 in | Wt 259.2 lb

## 2019-10-25 DIAGNOSIS — Z7189 Other specified counseling: Secondary | ICD-10-CM

## 2019-10-25 DIAGNOSIS — D509 Iron deficiency anemia, unspecified: Secondary | ICD-10-CM | POA: Diagnosis not present

## 2019-10-25 DIAGNOSIS — E1122 Type 2 diabetes mellitus with diabetic chronic kidney disease: Secondary | ICD-10-CM | POA: Diagnosis not present

## 2019-10-25 DIAGNOSIS — I10 Essential (primary) hypertension: Secondary | ICD-10-CM | POA: Diagnosis not present

## 2019-10-25 DIAGNOSIS — D472 Monoclonal gammopathy: Secondary | ICD-10-CM | POA: Diagnosis not present

## 2019-10-25 DIAGNOSIS — C220 Liver cell carcinoma: Secondary | ICD-10-CM

## 2019-10-25 DIAGNOSIS — Z5112 Encounter for antineoplastic immunotherapy: Secondary | ICD-10-CM | POA: Diagnosis not present

## 2019-10-25 DIAGNOSIS — N189 Chronic kidney disease, unspecified: Secondary | ICD-10-CM | POA: Diagnosis not present

## 2019-10-25 DIAGNOSIS — I129 Hypertensive chronic kidney disease with stage 1 through stage 4 chronic kidney disease, or unspecified chronic kidney disease: Secondary | ICD-10-CM | POA: Diagnosis not present

## 2019-10-25 DIAGNOSIS — K7460 Unspecified cirrhosis of liver: Secondary | ICD-10-CM | POA: Diagnosis not present

## 2019-10-25 DIAGNOSIS — Z79899 Other long term (current) drug therapy: Secondary | ICD-10-CM | POA: Diagnosis not present

## 2019-10-25 LAB — CMP (CANCER CENTER ONLY)
ALT: 35 U/L (ref 0–44)
AST: 42 U/L — ABNORMAL HIGH (ref 15–41)
Albumin: 4 g/dL (ref 3.5–5.0)
Alkaline Phosphatase: 43 U/L (ref 38–126)
Anion gap: 9 (ref 5–15)
BUN: 19 mg/dL (ref 8–23)
CO2: 26 mmol/L (ref 22–32)
Calcium: 9.6 mg/dL (ref 8.9–10.3)
Chloride: 101 mmol/L (ref 98–111)
Creatinine: 1.44 mg/dL — ABNORMAL HIGH (ref 0.61–1.24)
GFR, Est AFR Am: 55 mL/min — ABNORMAL LOW (ref >60)
GFR, Estimated: 48 mL/min — ABNORMAL LOW (ref >60)
Glucose, Bld: 165 mg/dL — ABNORMAL HIGH (ref 70–99)
Potassium: 4.2 mmol/L (ref 3.5–5.1)
Sodium: 136 mmol/L (ref 135–145)
Total Bilirubin: 0.6 mg/dL (ref 0.3–1.2)
Total Protein: 8 g/dL (ref 6.5–8.1)

## 2019-10-25 LAB — CBC WITH DIFFERENTIAL (CANCER CENTER ONLY)
Abs Immature Granulocytes: 0.03 10*3/uL (ref 0.00–0.07)
Basophils Absolute: 0 10*3/uL (ref 0.0–0.1)
Basophils Relative: 1 %
Eosinophils Absolute: 0.1 10*3/uL (ref 0.0–0.5)
Eosinophils Relative: 2 %
HCT: 38.2 % — ABNORMAL LOW (ref 39.0–52.0)
Hemoglobin: 11.6 g/dL — ABNORMAL LOW (ref 13.0–17.0)
Immature Granulocytes: 1 %
Lymphocytes Relative: 31 %
Lymphs Abs: 1.8 10*3/uL (ref 0.7–4.0)
MCH: 21.5 pg — ABNORMAL LOW (ref 26.0–34.0)
MCHC: 30.4 g/dL (ref 30.0–36.0)
MCV: 70.7 fL — ABNORMAL LOW (ref 80.0–100.0)
Monocytes Absolute: 0.5 10*3/uL (ref 0.1–1.0)
Monocytes Relative: 9 %
Neutro Abs: 3.3 10*3/uL (ref 1.7–7.7)
Neutrophils Relative %: 56 %
Platelet Count: 272 10*3/uL (ref 150–400)
RBC: 5.4 MIL/uL (ref 4.22–5.81)
RDW: 16.7 % — ABNORMAL HIGH (ref 11.5–15.5)
WBC Count: 5.7 10*3/uL (ref 4.0–10.5)
nRBC: 0 % (ref 0.0–0.2)

## 2019-10-25 LAB — TOTAL PROTEIN, URINE DIPSTICK: Protein, ur: NEGATIVE mg/dL

## 2019-10-25 LAB — TSH: TSH: 1.124 u[IU]/mL (ref 0.320–4.118)

## 2019-10-25 MED ORDER — SODIUM CHLORIDE 0.9 % IV SOLN
1200.0000 mg | Freq: Once | INTRAVENOUS | Status: AC
  Administered 2019-10-25: 1200 mg via INTRAVENOUS
  Filled 2019-10-25: qty 20

## 2019-10-25 MED ORDER — SODIUM CHLORIDE 0.9 % IV SOLN
15.0000 mg/kg | Freq: Once | INTRAVENOUS | Status: AC
  Administered 2019-10-25: 1700 mg via INTRAVENOUS
  Filled 2019-10-25: qty 64

## 2019-10-25 MED ORDER — SODIUM CHLORIDE 0.9 % IV SOLN
Freq: Once | INTRAVENOUS | Status: AC
  Filled 2019-10-25: qty 250

## 2019-10-25 NOTE — Patient Instructions (Signed)
[facility] Discharge Instructions for Patients Receiving Chemotherapy  Today you received the following chemotherapy agents Bevacizumab and Tecentriq  To help prevent nausea and vomiting after your treatment, we encourage you to take your nausea medication as directed.   If you develop nausea and vomiting that is not controlled by your nausea medication, call the clinic.   BELOW ARE SYMPTOMS THAT SHOULD BE REPORTED IMMEDIATELY:  *FEVER GREATER THAN 100.5 F  *CHILLS WITH OR WITHOUT FEVER  NAUSEA AND VOMITING THAT IS NOT CONTROLLED WITH YOUR NAUSEA MEDICATION  *UNUSUAL SHORTNESS OF BREATH  *UNUSUAL BRUISING OR BLEEDING  TENDERNESS IN MOUTH AND THROAT WITH OR WITHOUT PRESENCE OF ULCERS  *URINARY PROBLEMS  *BOWEL PROBLEMS  UNUSUAL RASH Items with * indicate a potential emergency and should be followed up as soon as possible.  Feel free to call the clinic should you have any questions or concerns. The clinic phone number is [phone].  Please show the [facility] at check-in to the Emergency Department and triage nurse.  Atezolizumab injection What is this medicine? ATEZOLIZUMAB (a te zoe LIZ ue mab) is a monoclonal antibody. It is used to treat bladder cancer (urothelial cancer), liver cancer, lung cancer, breast cancer, and melanoma. This medicine may be used for other purposes; ask your health care provider or pharmacist if you have questions. COMMON BRAND NAME(S): Tecentriq What should I tell my health care provider before I take this medicine? They need to know if you have any of these conditions:  diabetes  immune system problems  infection  inflammatory bowel disease  liver disease  lung or breathing disease  lupus  nervous system problems like myasthenia gravis or Guillain-Barre syndrome  organ transplant  an unusual or allergic reaction to atezolizumab, other medicines, foods, dyes, or preservatives  pregnant or trying to get  pregnant  breast-feeding How should I use this medicine? This medicine is for infusion into a vein. It is given by a health care professional in a hospital or clinic setting. A special MedGuide will be given to you before each treatment. Be sure to read this information carefully each time. Talk to your pediatrician regarding the use of this medicine in children. Special care may be needed. Overdosage: If you think you have taken too much of this medicine contact a poison control center or emergency room at once. NOTE: This medicine is only for you. Do not share this medicine with others. What if I miss a dose? It is important not to miss your dose. Call your doctor or health care professional if you are unable to keep an appointment. What may interact with this medicine? Interactions have not been studied. This list may not describe all possible interactions. Give your health care provider a list of all the medicines, herbs, non-prescription drugs, or dietary supplements you use. Also tell them if you smoke, drink alcohol, or use illegal drugs. Some items may interact with your medicine. What should I watch for while using this medicine? Your condition will be monitored carefully while you are receiving this medicine. You may need blood work done while you are taking this medicine. Do not become pregnant while taking this medicine or for at least 5 months after stopping it. Women should inform their doctor if they wish to become pregnant or think they might be pregnant. There is a potential for serious side effects to an unborn child. Talk to your health care professional or pharmacist for more information. Do not breast-feed an  infant while taking this medicine or for at least 5 months after the last dose. What side effects may I notice from receiving this medicine? Side effects that you should report to your doctor or health care professional as soon as possible:  allergic reactions like skin  rash, itching or hives, swelling of the face, lips, or tongue  black, tarry stools  bloody or watery diarrhea  breathing problems  changes in vision  chest pain or chest tightness  chills  facial flushing  fever  headache  signs and symptoms of high blood sugar such as dizziness; dry mouth; dry skin; fruity breath; nausea; stomach pain; increased hunger or thirst; increased urination  signs and symptoms of liver injury like dark yellow or brown urine; general ill feeling or flu-like symptoms; light-colored stools; loss of appetite; nausea; right upper belly pain; unusually weak or tired; yellowing of the eyes or skin  stomach pain  trouble passing urine or change in the amount of urine Side effects that usually do not require medical attention (report to your doctor or health care professional if they continue or are bothersome):  bone pain  cough  diarrhea  joint pain  muscle pain  muscle weakness  swelling of arms or legs  tiredness  weight loss This list may not describe all possible side effects. Call your doctor for medical advice about side effects. You may report side effects to FDA at 1-800-FDA-1088. Where should I keep my medicine? This drug is given in a hospital or clinic and will not be stored at home. NOTE: This sheet is a summary. It may not cover all possible information. If you have questions about this medicine, talk to your doctor, pharmacist, or health care provider.  2020 [employer]/Gold Standard (2019-03-31 13:11:14)  Bevacizumab injection What is this medicine? BEVACIZUMAB (be va SIZ yoo mab) is a monoclonal antibody. It is used to treat many types of cancer. This medicine may be used for other purposes; ask your health care provider or pharmacist if you have questions. COMMON BRAND NAME(S): Avastin, MVASI, Zirabev What should I tell my health care provider before I take this medicine? They need to know if you have any of these  conditions:  diabetes  heart disease  high blood pressure  history of coughing up blood  prior anthracycline chemotherapy (e.g., doxorubicin, daunorubicin, epirubicin)  recent or ongoing radiation therapy  recent or planning to have surgery  stroke  an unusual or allergic reaction to bevacizumab, hamster proteins, mouse proteins, other medicines, foods, dyes, or preservatives  pregnant or trying to get pregnant  breast-feeding How should I use this medicine? This medicine is for infusion into a vein. It is given by a health care professional in a hospital or clinic setting. Talk to your pediatrician regarding the use of this medicine in children. Special care may be needed. Overdosage: If you think you have taken too much of this medicine contact a poison control center or emergency room at once. NOTE: This medicine is only for you. Do not share this medicine with others. What if I miss a dose? It is important not to miss your dose. Call your doctor or health care professional if you are unable to keep an appointment. What may interact with this medicine? Interactions are not expected. This list may not describe all possible interactions. Give your health care provider a list of all the medicines, herbs, non-prescription drugs, or dietary supplements you use. Also tell them if you smoke, drink alcohol, or  use illegal drugs. Some items may interact with your medicine. What should I watch for while using this medicine? Your condition will be monitored carefully while you are receiving this medicine. You will need important blood work and urine testing done while you are taking this medicine. This medicine may increase your risk to bruise or bleed. Call your doctor or health care professional if you notice any unusual bleeding. Before having surgery, talk to your health care provider to make sure it is ok. This drug can increase the risk of poor healing of your surgical site or  wound. You will need to stop this drug for 28 days before surgery. After surgery, wait at least 28 days before restarting this drug. Make sure the surgical site or wound is healed enough before restarting this drug. Talk to your health care provider if questions. Do not become pregnant while taking this medicine or for 6 months after stopping it. Women should inform their doctor if they wish to become pregnant or think they might be pregnant. There is a potential for serious side effects to an unborn child. Talk to your health care professional or pharmacist for more information. Do not breast-feed an infant while taking this medicine and for 6 months after the last dose. This medicine has caused ovarian failure in some women. This medicine may interfere with the ability to have a child. You should talk to your doctor or health care professional if you are concerned about your fertility. What side effects may I notice from receiving this medicine? Side effects that you should report to your doctor or health care professional as soon as possible:  allergic reactions like skin rash, itching or hives, swelling of the face, lips, or tongue  chest pain or chest tightness  chills  coughing up blood  high fever  seizures  severe constipation  signs and symptoms of bleeding such as bloody or black, tarry stools; red or dark-brown urine; spitting up blood or brown material that looks like coffee grounds; red spots on the skin; unusual bruising or bleeding from the eye, gums, or nose  signs and symptoms of a blood clot such as breathing problems; chest pain; severe, sudden headache; pain, swelling, warmth in the leg  signs and symptoms of a stroke like changes in vision; confusion; trouble speaking or understanding; severe headaches; sudden numbness or weakness of the face, arm or leg; trouble walking; dizziness; loss of balance or coordination  stomach pain  sweating  swelling of legs or  ankles  vomiting  weight gain Side effects that usually do not require medical attention (report to your doctor or health care professional if they continue or are bothersome):  back pain  changes in taste  decreased appetite  dry skin  nausea  tiredness This list may not describe all possible side effects. Call your doctor for medical advice about side effects. You may report side effects to FDA at 1-800-FDA-1088. Where should I keep my medicine? This drug is given in a hospital or clinic and will not be stored at home. NOTE: This sheet is a summary. It may not cover all possible information. If you have questions about this medicine, talk to your doctor, pharmacist, or health care provider.  2020 Elsevier/Gold Standard (2019-06-07 10:50:46)

## 2019-10-26 ENCOUNTER — Telehealth: Payer: Self-pay | Admitting: *Deleted

## 2019-10-26 LAB — AFP TUMOR MARKER: AFP, Serum, Tumor Marker: 126 ng/mL — ABNORMAL HIGH (ref 0.0–8.3)

## 2019-10-26 NOTE — Telephone Encounter (Signed)
-----   Message from Veverly Fells, RN sent at 10/25/2019 12:49 PM EST ----- Regarding: [facility] First time follow-up First tecentriq and Avastin today. Pt tolerated well with no complaints.

## 2019-10-26 NOTE — Telephone Encounter (Signed)
Contacted patient to f/u after treatment yesterday. He reports he feels well and is eating/drinking fluids with no issue. Encouraged to contact office for questions or concerns.

## 2019-10-31 ENCOUNTER — Other Ambulatory Visit: Payer: Self-pay | Admitting: *Deleted

## 2019-10-31 DIAGNOSIS — C220 Liver cell carcinoma: Secondary | ICD-10-CM

## 2019-11-01 ENCOUNTER — Other Ambulatory Visit: Payer: Self-pay

## 2019-11-01 ENCOUNTER — Inpatient Hospital Stay: Payer: Medicare HMO

## 2019-11-01 ENCOUNTER — Inpatient Hospital Stay (HOSPITAL_BASED_OUTPATIENT_CLINIC_OR_DEPARTMENT_OTHER): Payer: Medicare HMO | Admitting: Hematology

## 2019-11-01 VITALS — BP 184/77 | HR 58 | Temp 99.1°F | Resp 17 | Ht 72.0 in | Wt 255.5 lb

## 2019-11-01 DIAGNOSIS — N189 Chronic kidney disease, unspecified: Secondary | ICD-10-CM | POA: Diagnosis not present

## 2019-11-01 DIAGNOSIS — K7460 Unspecified cirrhosis of liver: Secondary | ICD-10-CM | POA: Diagnosis not present

## 2019-11-01 DIAGNOSIS — B1920 Unspecified viral hepatitis C without hepatic coma: Secondary | ICD-10-CM | POA: Diagnosis not present

## 2019-11-01 DIAGNOSIS — D509 Iron deficiency anemia, unspecified: Secondary | ICD-10-CM | POA: Diagnosis not present

## 2019-11-01 DIAGNOSIS — C220 Liver cell carcinoma: Secondary | ICD-10-CM

## 2019-11-01 DIAGNOSIS — D472 Monoclonal gammopathy: Secondary | ICD-10-CM | POA: Diagnosis not present

## 2019-11-01 DIAGNOSIS — I129 Hypertensive chronic kidney disease with stage 1 through stage 4 chronic kidney disease, or unspecified chronic kidney disease: Secondary | ICD-10-CM | POA: Diagnosis not present

## 2019-11-01 DIAGNOSIS — I10 Essential (primary) hypertension: Secondary | ICD-10-CM | POA: Diagnosis not present

## 2019-11-01 DIAGNOSIS — Z5112 Encounter for antineoplastic immunotherapy: Secondary | ICD-10-CM | POA: Diagnosis not present

## 2019-11-01 DIAGNOSIS — E1122 Type 2 diabetes mellitus with diabetic chronic kidney disease: Secondary | ICD-10-CM | POA: Diagnosis not present

## 2019-11-01 LAB — CMP (CANCER CENTER ONLY)
ALT: 39 U/L (ref 0–44)
AST: 46 U/L — ABNORMAL HIGH (ref 15–41)
Albumin: 3.9 g/dL (ref 3.5–5.0)
Alkaline Phosphatase: 45 U/L (ref 38–126)
Anion gap: 9 (ref 5–15)
BUN: 25 mg/dL — ABNORMAL HIGH (ref 8–23)
CO2: 29 mmol/L (ref 22–32)
Calcium: 9.5 mg/dL (ref 8.9–10.3)
Chloride: 101 mmol/L (ref 98–111)
Creatinine: 1.56 mg/dL — ABNORMAL HIGH (ref 0.61–1.24)
GFR, Est AFR Am: 50 mL/min — ABNORMAL LOW (ref >60)
GFR, Estimated: 43 mL/min — ABNORMAL LOW (ref >60)
Glucose, Bld: 137 mg/dL — ABNORMAL HIGH (ref 70–99)
Potassium: 4.7 mmol/L (ref 3.5–5.1)
Sodium: 139 mmol/L (ref 135–145)
Total Bilirubin: 0.6 mg/dL (ref 0.3–1.2)
Total Protein: 7.9 g/dL (ref 6.5–8.1)

## 2019-11-01 LAB — CBC WITH DIFFERENTIAL (CANCER CENTER ONLY)
Abs Immature Granulocytes: 0.02 10*3/uL (ref 0.00–0.07)
Basophils Absolute: 0 10*3/uL (ref 0.0–0.1)
Basophils Relative: 1 %
Eosinophils Absolute: 0.1 10*3/uL (ref 0.0–0.5)
Eosinophils Relative: 3 %
HCT: 38.1 % — ABNORMAL LOW (ref 39.0–52.0)
Hemoglobin: 11.6 g/dL — ABNORMAL LOW (ref 13.0–17.0)
Immature Granulocytes: 0 %
Lymphocytes Relative: 35 %
Lymphs Abs: 1.7 10*3/uL (ref 0.7–4.0)
MCH: 21.6 pg — ABNORMAL LOW (ref 26.0–34.0)
MCHC: 30.4 g/dL (ref 30.0–36.0)
MCV: 70.8 fL — ABNORMAL LOW (ref 80.0–100.0)
Monocytes Absolute: 0.5 10*3/uL (ref 0.1–1.0)
Monocytes Relative: 10 %
Neutro Abs: 2.4 10*3/uL (ref 1.7–7.7)
Neutrophils Relative %: 51 %
Platelet Count: 276 10*3/uL (ref 150–400)
RBC: 5.38 MIL/uL (ref 4.22–5.81)
RDW: 16.6 % — ABNORMAL HIGH (ref 11.5–15.5)
WBC Count: 4.8 10*3/uL (ref 4.0–10.5)
nRBC: 0 % (ref 0.0–0.2)

## 2019-11-01 LAB — TOTAL PROTEIN, URINE DIPSTICK: Protein, ur: NEGATIVE mg/dL

## 2019-11-01 LAB — TSH: TSH: 1.218 u[IU]/mL (ref 0.320–4.118)

## 2019-11-01 NOTE — Progress Notes (Signed)
HEMATOLOGY/ONCOLOGY CLINIC NOTE  Date of Service: 11/01/2019  Patient Care Team: Jani Gravel, MD as PCP - General (Internal Medicine)  CHIEF COMPLAINTS/PURPOSE OF CONSULTATION:   [facility]- progression,toxicity check with avastin + atezolizumab  HISTORY OF PRESENTING ILLNESS:   Aaron [name] is a wonderful 74 y.o. male who has been referred to [place] by Dr Jani Gravel for evaluation and management of monoclonal gammopathy of undetermined significance.  Patient has history of hypertension, diabetes, dyslipidemia, hepatocellular carcinoma (rx with TACE and percutaneous thermal ablation), hepatitis C status post treatment, iron deficiency anemia in 2014 treated with oral iron.  Patient had an SPEP done by his primary care physician on 10/04/2015 that showed an M spike of 0.3 g/dL. It was presumably will be done due to his complaints of fatigue. He has had no significant anemia. No new bone pains. No fevers/chills/drenching night sweats.  No new anemia..  Outside labs show hemoglobin of 12.4 with microcytosis with an MCV of 70.5, normal WBC count of 5.1k.  No evidence of hypercalcemia or significant renal failure on his outside labs.   INTERVAL HISTORY  Aaron [name]  is here for his scheduled follow-up for MGUS and HCC. He is a toxicity check after C1 of Atezolizumab + Avastin. The patient's last visit with [place] was on 10/10/2019. The pt reports that he is doing well overall.  The pt reports that he tolerated his first treatment well and denies any pertinent symptoms. Pt takes Lisinopril + HCTZ for his HTN in the mornings. Pt has stopped eating lunch and has cut out breads and other wheat products to help with his blood sugars. He is otherwise able to eat well.   Lab results today (11/01/19) of CBC w/diff and CMP is as follows: all values are WNL except for Hgb at 11.6, HCT at 38.1, MCV at 70.8, MCH at 21.6, RDW at 16.6, Glucose at 137, BUN at 25, Creatinine at 1.56, AST at 46, GFR Est Af Am at  50. 11/01/2019 TSH at 1.218 11/01/2019 Total Protein Ur is "Negative"  On review of systems, pt reports chronic knee pain, healthy appetite and denies N/V/D, SOB, fevers, chills, headaches, fatigue and any other symptoms.   MEDICAL HISTORY:  Past Medical History:  Diagnosis Date  . Arthritis   . Diabetes mellitus without complication ([place])    type II   . Elevated liver enzymes   . Fatty liver   . Hepatitis C    genotype 1A status post treatment with Viekira and Ribavarin for 24 weeks  . Hepatocellular carcinoma ([place]) 01/30/2014   Path  . History of colon polyps   . Hyperlipidemia   . Hypertension   . Iron deficiency anemia    hx of   . MGUS (monoclonal gammopathy of unknown significance)    Obesity Hepatocellular carcinoma treated with TACE and percutaneous thermal ablation by interventional radiology. Last MRI on 08/06/2015 showed slight decrease in the size of the ablation defect involving the right lobe of the liver area and no findings to suggest residual or recurrent hepatocellular carcinoma. Mild changes of liver cirrhosis. MRI Abd 06/24/2016- no evidence of recurrent HCC   Hepatitis C genotype 1A status post treatment with Viekira and Ribavarin for 24 weeks.  Monoclonal gammopathy of undetermined significance.  SURGICAL HISTORY:  Status post microwave ablation[October 2015] of liver lesion and TACE [July 2015] for [place]. EGD and colonoscopy in 2016   SOCIAL HISTORY: Social History   Socioeconomic History  . Marital status: Married  Spouse name: Not on file  . Number of children: Not on file  . Years of education: Not on file  . Highest education level: Not on file  Occupational History  . Not on file  Tobacco Use  . Smoking status: Former Smoker    Quit date: 01/30/1994    Years since quitting: 25.7  . Smokeless tobacco: Never Used  . Tobacco comment: stopped 25 yrs ago  Substance and Sexual Activity  . Alcohol use: No    Comment: stopped 30 years ago   .  Drug use: No  . Sexual activity: Not Currently  Other Topics Concern  . Not on file  Social History Narrative  . Not on file   Social Determinants of Health   Financial Resource Strain:   . Difficulty of Paying Living Expenses: Not on file  Food Insecurity:   . Worried About Charity fundraiser in the Last Year: Not on file  . Ran Out of Food in the Last Year: Not on file  Transportation Needs:   . Lack of Transportation (Medical): Not on file  . Lack of Transportation (Non-Medical): Not on file  Physical Activity:   . Days of Exercise per Week: Not on file  . Minutes of Exercise per Session: Not on file  Stress:   . Feeling of Stress : Not on file  Social Connections:   . Frequency of Communication with Friends and Family: Not on file  . Frequency of Social Gatherings with Friends and Family: Not on file  . Attends Religious Services: Not on file  . Active Member of Clubs or Organizations: Not on file  . Attends Archivist Meetings: Not on file  . Marital Status: Not on file  Intimate Partner Violence:   . Fear of Current or Ex-Partner: Not on file  . Emotionally Abused: Not on file  . Physically Abused: Not on file  . Sexually Abused: Not on file  Former smoker and smoked 1 pack per day for about 20 years starting at age 43 quit 72 years ago.  FAMILY HISTORY: No family history on file.  ALLERGIES:  has No Known Allergies.  MEDICATIONS:  Current Outpatient Medications  Medication Sig Dispense Refill  . ALPRAZolam (XANAX) 0.5 MG tablet Take 1 tablet (0.5 mg total) by mouth as directed. Take 2 tablets (1.0 mg total) prior to MR scan.  Make take an additional 0.5 mg po if needed. (Patient not taking: Reported on 07/28/2019) 3 tablet 0  . aspirin EC 81 MG tablet Take 81 mg by mouth every morning.     . cholecalciferol (VITAMIN D) 1000 UNITS tablet Take 1,000 Units by mouth every morning.     . Insulin Glargine (LANTUS SOLOSTAR) 100 UNIT/ML Solostar Pen Inject 32  Units into the skin daily.    [name] Kitchen lisinopril-hydrochlorothiazide (PRINZIDE,ZESTORETIC) 20-12.5 MG tablet Take 1 tablet by mouth every morning.     . metFORMIN (GLUCOPHAGE) 1000 MG tablet Take 1,000 mg by mouth 2 (two) times daily with a meal.    . Multiple Vitamin (MULTIVITAMIN WITH MINERALS) TABS tablet Take 1 tablet daily by mouth.    . oxyCODONE (OXY IR/ROXICODONE) 5 MG immediate release tablet Take 1 tablet (5 mg total) by mouth every 6 (six) hours as needed for up to 15 doses for moderate pain or severe pain. (Patient not taking: Reported on 07/28/2019) 15 tablet 0  . PAZEO 0.7 % SOLN Place 1 drop into both eyes every morning.   3  .  simvastatin (ZOCOR) 40 MG tablet Take 20 mg by mouth every evening.      No current facility-administered medications for this visit.    REVIEW OF SYSTEMS:   A 10+ POINT REVIEW OF SYSTEMS WAS OBTAINED including neurology, dermatology, psychiatry, cardiac, respiratory, lymph, extremities, GI, GU, Musculoskeletal, constitutional, breasts, reproductive, HEENT.  All pertinent positives are noted in the HPI.  All others are negative.   PHYSICAL EXAMINATION: ECOG FS:1 - Symptomatic but completely ambulatory  There were no vitals filed for this visit. Wt Readings from Last 3 Encounters:  10/25/19 259 lb 4 oz (117.6 kg)  09/29/19 255 lb (115.7 kg)  07/28/19 264 lb 14.4 oz (120.2 kg)   There is no height or weight on file to calculate BMI.    GENERAL:alert, in no acute distress and comfortable SKIN: no acute rashes, no significant lesions EYES: conjunctiva are pink and non-injected, sclera anicteric OROPHARYNX: MMM, no exudates, no oropharyngeal erythema or ulceration NECK: supple, no JVD LYMPH:  no palpable lymphadenopathy in the cervical, axillary or inguinal regions LUNGS: clear to auscultation b/l with normal respiratory effort HEART: regular rate & rhythm ABDOMEN:  normoactive bowel sounds , non tender, not distended. No palpable hepatosplenomegaly.   Extremity: no pedal edema PSYCH: alert & oriented x 3 with fluent speech NEURO: no focal motor/sensory deficits  LABORATORY DATA:  I have reviewed the data as listed  . CBC Latest Ref Rng & Units 11/01/2019 10/25/2019 09/29/2019  WBC 4.0 - 10.5 K/uL 4.8 5.7 6.0  Hemoglobin 13.0 - 17.0 g/dL 11.6(L) 11.6(L) 11.3(L)  Hematocrit 39.0 - 52.0 % 38.1(L) 38.2(L) 37.9(L)  Platelets 150 - 400 K/uL 276 272 310   . CBC    Component Value Date/Time   WBC 4.8 11/01/2019 0806   WBC 6.0 09/29/2019 0909   RBC 5.38 11/01/2019 0806   HGB 11.6 (L) 11/01/2019 0806   HGB 11.5 (L) 03/22/2017 1402   HCT 38.1 (L) 11/01/2019 0806   HCT 37.0 (L) 03/22/2017 1402   PLT 276 11/01/2019 0806   PLT 262 03/22/2017 1402   MCV 70.8 (L) 11/01/2019 0806   MCV 72.3 (L) 03/22/2017 1402   MCH 21.6 (L) 11/01/2019 0806   MCHC 30.4 11/01/2019 0806   RDW 16.6 (H) 11/01/2019 0806   RDW 15.6 (H) 03/22/2017 1402   LYMPHSABS 1.7 11/01/2019 0806   LYMPHSABS 1.8 03/22/2017 1402   MONOABS 0.5 11/01/2019 0806   MONOABS 0.4 03/22/2017 1402   EOSABS 0.1 11/01/2019 0806   EOSABS 0.1 03/22/2017 1402   BASOSABS 0.0 11/01/2019 0806   BASOSABS 0.0 03/22/2017 1402    . CMP Latest Ref Rng & Units 11/01/2019 10/25/2019 09/29/2019  Glucose 70 - 99 mg/dL 137(H) 165(H) 146(H)  BUN 8 - 23 mg/dL 25(H) 19 22  Creatinine 0.61 - 1.24 mg/dL 1.56(H) 1.44(H) 1.67(H)  Sodium 135 - 145 mmol/L 139 136 140  Potassium 3.5 - 5.1 mmol/L 4.7 4.2 5.0  Chloride 98 - 111 mmol/L 101 101 103  CO2 22 - 32 mmol/L 29 26 26   Calcium 8.9 - 10.3 mg/dL 9.5 9.6 9.5  Total Protein 6.5 - 8.1 g/dL 7.9 8.0 8.2(H)  Total Bilirubin 0.3 - 1.2 mg/dL 0.6 0.6 0.5  Alkaline Phos 38 - 126 U/L 45 43 41  AST 15 - 41 U/L 46(H) 42(H) 34  ALT 0 - 44 U/L 39 35 29    06/21/2019 MR ABDOMEN WWO CONTRAST (Accession [id number])     IFE 1  Comment   Comments: Immunofixation shows IgG monoclonal  protein with kappa light chain  specificity.           RADIOGRAPHIC  STUDIES:  MRI abd w and wo contrast: 12/17/2016: IMPRESSION: 1. Postprocedural changes of thermal ablation again noted in the right lobe of the liver, without definitive evidence to suggest local recurrence of disease. No new hepatic lesions are noted. 2. Additional incidental findings, as above.   Electronically Signed   By: Vinnie Langton M.D.   On: 12/17/2016 09:42  ASSESSMENT & PLAN:   74 y.o. male with  #1 Monoclonal gammopathy of undetermined significance.  M protein 0.2 mg/dL immunofixation showing IgG monoclonal protein with kappa light chain specificity. SPEP 02/2016 - 0.3g/dl  No overt anemia.  No overt hypercalcemia. Stable CKD creatinine 1.6  #2 bone lucencies in the right radial mid midshaft and left humeral head. PET/CT did not show any hypermetabolic bone lesions. MRI left shoulder showed no evidence of metastatic disease or multiple myeloma. The x-ray findings appear to be areas of mild demineralization. Patient was seen by orthopedics and no additional recommendations were given.  Plan -Patient's anemia is stable. No significant change in renal function. No new bone pains. No hypercalcemia . No overall no overt evidence of progression of multiple myeloma. -Myeloma labs from today show stable M protein @ 0.2g/dl with no evidence of progression- consistent with MGUS.  #3 Hepatocellular carcinoma treated with TACE and percutaneous thermal ablation by interventional radiology.   Last MRI on 08/06/2015 showed slight decrease in the size of the ablation defect involving the right lobe of the liver area and no findings to suggest residual or recurrent hepatocellular carcinoma. Mild changes of liver cirrhosis.  01/16/2016 MRI Abdomen showed resolution of previously ablated lesion but a new 1.3 cm lesion was noted which was subsequently ablated under CT guidance by interventional radiology on 01/31/2016. Elevated transaminases on 02/01/2016 were likely related to his  ablation. These have since resolved.   06/24/2016 MRI Abdomen showed no evidence of recurrent hepatocellular carcinoma . 12/17/2016 MRI Abdomen shows no evidence of recurrent hepatocellular carcinoma .  10/26/17 MRI Abdomen revealed Motion degraded images. Status post thermal ablation of the segment 8 lesion, without enhancement to suggest residual viable tumor. Prior ablation of a segment 7 lesion, grossly unchanged. No convincing central enhancement  03/14/2019  MRI abdomen w and wo contrast revealed "1. New areas of restricted diffusion, surrounding nodular arterial phase enhancement and delayed washout surrounding the ablation zone defects anteriorly in segments 8 and 7 consistent with local recurrence of hepatocellular carcinoma. 2. The peripheral ablation zone defect laterally in segment 8 is unchanged. 3. No extrahepatic tumor identified."  10/02/2019 MRI Abdomen ([id number]) revealed "1. Progressive multifocal hepatocellular carcinoma, especially anteriorly around the ablated lesion in segments 4B and 8. There are additional new lesions in segments 2 and 3. No evidence of extrahepatic metastatic disease."  #4 Hepatitis C genotype 1A status post treatment with Viekira and Ribavarin for 24 weeks.  Plan -continue f/u with IR-Dr. Markus Daft for ongoing management of [place].   - AFP tumor marker from today wnl at 1.9. -Continue follow-up and hepatitis C clinic for continued monitoring and management of his hepatitis C.  #5 Microcytosis with Minimal Anemia - Hgb electrophoresis suggestive of Beta thal trait given relative polycythemia. Some element of iron defi Iron sat 18%, ferritin 88 but could be over estimate in the setting of some liver injury Ferritin level stable at 77 today.  PLAN:  -Discussed pt labwork today, 11/01/19; blood counts and chemistries are  holding steady  -Discussed 11/01/2019 TSH is WNL -Discussed 11/01/2019 Total Protein Ur is "Negative" -Recommend pt monitor his BP at home.  Advised to let us know if systolic >950-722 in the morning - would need to adjust medication in that case.  -Recommend pt walk 5000-10000 steps after meals to reduce blood sugar spike  -Advised pt that his disease changed characteristics and began growing in multiple places at once, which is why we moved to systemic therapy vs. local therapy.  -Advised pt that this treatment is palliative, not curative -Will reassess treatment effectiveness after 3-4 cycles -Advised pt that we will continue treatment until progression or intolerance  -Will work with Dr. Anselm Pancoast closely in case there is a need for local intervention -Continue daily Asprin  -Continue Lisinopril + HCTZ as prescribed -Recommend OTC Voltaren for arthritic knee pain -Will test for Hep C serologies with next labs -Recommended pt continue to f/u with Dr. Anselm Pancoast  -Will see back with next treatment   FOLLOW UP: Plz scheduled C2 and C3 of treatment as per orders with labs and MD visit    The total time spent in the appt was 20 minutes and more than 50% was on counseling and direct patient cares.  All of the patient's questions were answered with apparent satisfaction. The patient knows to call the clinic with any problems, questions or concerns.   [name] MD [place] AAHIVMS [facility] Hematology/Oncology Physician [facility]  (Office):       [phone] (Work cell):  [phone] (Fax):           [phone]   I, Yevette Edwards, am acting as a scribe for Dr. [name].   .I have reviewed the above documentation for accuracy and completeness, and I agree with the above. Brunetta Genera MD

## 2019-11-09 NOTE — Progress Notes (Signed)
Pharmacist Chemotherapy Monitoring - Follow Up Assessment    I verify that I have reviewed each item in the below checklist:  . Regimen for the patient is scheduled for the appropriate day and plan matches scheduled date. [name] Kitchen Appropriate non-routine labs are ordered dependent on drug ordered. . If applicable, additional medications reviewed and ordered per protocol based on lifetime cumulative doses and/or treatment regimen.   Plan for follow-up and/or issues identified: No . I-vent associated with next due treatment: No  Aaron [name] 11/09/2019 1:47 PM

## 2019-11-15 ENCOUNTER — Inpatient Hospital Stay: Payer: Medicare HMO | Admitting: Hematology

## 2019-11-15 ENCOUNTER — Other Ambulatory Visit: Payer: Self-pay

## 2019-11-15 ENCOUNTER — Inpatient Hospital Stay: Payer: Medicare HMO

## 2019-11-15 VITALS — BP 166/77 | HR 62 | Temp 98.7°F | Resp 16

## 2019-11-15 DIAGNOSIS — N189 Chronic kidney disease, unspecified: Secondary | ICD-10-CM | POA: Diagnosis not present

## 2019-11-15 DIAGNOSIS — D472 Monoclonal gammopathy: Secondary | ICD-10-CM | POA: Diagnosis not present

## 2019-11-15 DIAGNOSIS — Z86010 Personal history of colonic polyps: Secondary | ICD-10-CM | POA: Diagnosis not present

## 2019-11-15 DIAGNOSIS — C229 Malignant neoplasm of liver, not specified as primary or secondary: Secondary | ICD-10-CM | POA: Diagnosis not present

## 2019-11-15 DIAGNOSIS — C220 Liver cell carcinoma: Secondary | ICD-10-CM

## 2019-11-15 DIAGNOSIS — I129 Hypertensive chronic kidney disease with stage 1 through stage 4 chronic kidney disease, or unspecified chronic kidney disease: Secondary | ICD-10-CM | POA: Diagnosis not present

## 2019-11-15 DIAGNOSIS — B1920 Unspecified viral hepatitis C without hepatic coma: Secondary | ICD-10-CM

## 2019-11-15 DIAGNOSIS — I10 Essential (primary) hypertension: Secondary | ICD-10-CM | POA: Diagnosis not present

## 2019-11-15 DIAGNOSIS — K7460 Unspecified cirrhosis of liver: Secondary | ICD-10-CM | POA: Diagnosis not present

## 2019-11-15 DIAGNOSIS — I159 Secondary hypertension, unspecified: Secondary | ICD-10-CM | POA: Diagnosis not present

## 2019-11-15 DIAGNOSIS — Z7189 Other specified counseling: Secondary | ICD-10-CM

## 2019-11-15 DIAGNOSIS — D509 Iron deficiency anemia, unspecified: Secondary | ICD-10-CM | POA: Diagnosis not present

## 2019-11-15 DIAGNOSIS — E1122 Type 2 diabetes mellitus with diabetic chronic kidney disease: Secondary | ICD-10-CM | POA: Diagnosis not present

## 2019-11-15 DIAGNOSIS — Z5112 Encounter for antineoplastic immunotherapy: Secondary | ICD-10-CM

## 2019-11-15 DIAGNOSIS — D500 Iron deficiency anemia secondary to blood loss (chronic): Secondary | ICD-10-CM | POA: Diagnosis not present

## 2019-11-15 LAB — CMP (CANCER CENTER ONLY)
ALT: 25 U/L (ref 0–44)
AST: 36 U/L (ref 15–41)
Albumin: 4.1 g/dL (ref 3.5–5.0)
Alkaline Phosphatase: 47 U/L (ref 38–126)
Anion gap: 11 (ref 5–15)
BUN: 27 mg/dL — ABNORMAL HIGH (ref 8–23)
CO2: 25 mmol/L (ref 22–32)
Calcium: 9.8 mg/dL (ref 8.9–10.3)
Chloride: 102 mmol/L (ref 98–111)
Creatinine: 1.59 mg/dL — ABNORMAL HIGH (ref 0.61–1.24)
GFR, Est AFR Am: 49 mL/min — ABNORMAL LOW (ref >60)
GFR, Estimated: 42 mL/min — ABNORMAL LOW (ref >60)
Glucose, Bld: 189 mg/dL — ABNORMAL HIGH (ref 70–99)
Potassium: 4.7 mmol/L (ref 3.5–5.1)
Sodium: 138 mmol/L (ref 135–145)
Total Bilirubin: 0.5 mg/dL (ref 0.3–1.2)
Total Protein: 8.2 g/dL — ABNORMAL HIGH (ref 6.5–8.1)

## 2019-11-15 LAB — CBC WITH DIFFERENTIAL/PLATELET
Abs Immature Granulocytes: 0.02 10*3/uL (ref 0.00–0.07)
Basophils Absolute: 0 10*3/uL (ref 0.0–0.1)
Basophils Relative: 1 %
Eosinophils Absolute: 0.1 10*3/uL (ref 0.0–0.5)
Eosinophils Relative: 1 %
HCT: 41.6 % (ref 39.0–52.0)
Hemoglobin: 12.6 g/dL — ABNORMAL LOW (ref 13.0–17.0)
Immature Granulocytes: 0 %
Lymphocytes Relative: 28 %
Lymphs Abs: 1.5 10*3/uL (ref 0.7–4.0)
MCH: 22.2 pg — ABNORMAL LOW (ref 26.0–34.0)
MCHC: 30.3 g/dL (ref 30.0–36.0)
MCV: 73.2 fL — ABNORMAL LOW (ref 80.0–100.0)
Monocytes Absolute: 0.3 10*3/uL (ref 0.1–1.0)
Monocytes Relative: 6 %
Neutro Abs: 3.4 10*3/uL (ref 1.7–7.7)
Neutrophils Relative %: 64 %
Platelets: 311 10*3/uL (ref 150–400)
RBC: 5.68 MIL/uL (ref 4.22–5.81)
RDW: 17.6 % — ABNORMAL HIGH (ref 11.5–15.5)
WBC: 5.3 10*3/uL (ref 4.0–10.5)
nRBC: 0 % (ref 0.0–0.2)

## 2019-11-15 MED ORDER — SODIUM CHLORIDE 0.9 % IV SOLN
1200.0000 mg | Freq: Once | INTRAVENOUS | Status: AC
  Administered 2019-11-15: 1200 mg via INTRAVENOUS
  Filled 2019-11-15: qty 20

## 2019-11-15 MED ORDER — SODIUM CHLORIDE 0.9 % IV SOLN
15.0000 mg/kg | Freq: Once | INTRAVENOUS | Status: AC
  Administered 2019-11-15: 1700 mg via INTRAVENOUS
  Filled 2019-11-15: qty 64

## 2019-11-15 MED ORDER — SODIUM CHLORIDE 0.9 % IV SOLN
Freq: Once | INTRAVENOUS | Status: AC
  Filled 2019-11-15: qty 250

## 2019-11-15 MED ORDER — AMLODIPINE BESYLATE 5 MG PO TABS: 5.0000 mg | ORAL_TABLET | Freq: Every day | ORAL | 2 refills | Status: DC

## 2019-11-15 NOTE — Progress Notes (Signed)
Per Dr. Irene Limbo, okay to treat with creatine 1.59. Okay to use urine protein from previous visit.

## 2019-11-15 NOTE — Progress Notes (Signed)
HEMATOLOGY/ONCOLOGY CLINIC NOTE  Date of Service: 11/15/2019  Patient Care Team: Jani Gravel, MD as PCP - General (Internal Medicine)  CHIEF COMPLAINTS/PURPOSE OF CONSULTATION:   [facility]- progression,toxicity check with avastin + atezolizumab  HISTORY OF PRESENTING ILLNESS:   Aaron [name] is a wonderful 74 y.o. male who has been referred to [place] by Dr Jani Gravel for evaluation and management of monoclonal gammopathy of undetermined significance.  Patient has history of hypertension, diabetes, dyslipidemia, hepatocellular carcinoma (rx with TACE and percutaneous thermal ablation), hepatitis C status post treatment, iron deficiency anemia in 2014 treated with oral iron.  Patient had an SPEP done by his primary care physician on 10/04/2015 that showed an M spike of 0.3 g/dL. It was presumably will be done due to his complaints of fatigue. He has had no significant anemia. No new bone pains. No fevers/chills/drenching night sweats.  No new anemia..  Outside labs show hemoglobin of 12.4 with microcytosis with an MCV of 70.5, normal WBC count of 5.1k.  No evidence of hypercalcemia or significant renal failure on his outside labs.   INTERVAL HISTORY  Aaron [name]  is here for his scheduled follow-up for MGUS and HCC. He is here for C2 of Atezolizumab + Avastin. The patient's last visit with [place] was on 11/01/2019. The pt reports that he is doing well overall.  The pt reports pt has been taking his Lisinopril + HCTZ as prescribed. He has been taking his BP in the morning before activity or food. It was at 150/90 directly after his first treatment and had trended down to 140/70 at his last check. He denies any new issues or concerns with his Atezolizumab + Avastin.   Lab results today (11/15/19) of CBC w/diff and CMP is as follows: all values are WNL except for Hgb at 12.6, MCV at 73.2, MCH at 22.2, RDW at 17.6, Glucose at 189, BUN at 27, Creatinine at 1.59, Total Protein at 8.2, GFR Est Af Am at  49. 11/15/2019 AFP tumor marker is in progress 11/15/2019 HCV RNA quant is in progress  On review of systems, pt denies N/V/D, skin rashes, fatigue, leg swelling and any other symptoms.   MEDICAL HISTORY:  Past Medical History:  Diagnosis Date  . Arthritis   . Diabetes mellitus without complication ([place])    type II   . Elevated liver enzymes   . Fatty liver   . Hepatitis C    genotype 1A status post treatment with Viekira and Ribavarin for 24 weeks  . Hepatocellular carcinoma ([place]) 01/30/2014   Path  . History of colon polyps   . Hyperlipidemia   . Hypertension   . Iron deficiency anemia    hx of   . MGUS (monoclonal gammopathy of unknown significance)    Obesity Hepatocellular carcinoma treated with TACE and percutaneous thermal ablation by interventional radiology. Last MRI on 08/06/2015 showed slight decrease in the size of the ablation defect involving the right lobe of the liver area and no findings to suggest residual or recurrent hepatocellular carcinoma. Mild changes of liver cirrhosis. MRI Abd 06/24/2016- no evidence of recurrent HCC   Hepatitis C genotype 1A status post treatment with Viekira and Ribavarin for 24 weeks.  Monoclonal gammopathy of undetermined significance.  SURGICAL HISTORY:  Status post microwave ablation[October 2015] of liver lesion and TACE [July 2015] for [place]. EGD and colonoscopy in 2016   SOCIAL HISTORY: Social History   Socioeconomic History  . Marital status: Married    Spouse name:  Not on file  . Number of children: Not on file  . Years of education: Not on file  . Highest education level: Not on file  Occupational History  . Not on file  Tobacco Use  . Smoking status: Former Smoker    Quit date: 01/30/1994    Years since quitting: 25.8  . Smokeless tobacco: Never Used  . Tobacco comment: stopped 25 yrs ago  Substance and Sexual Activity  . Alcohol use: No    Comment: stopped 30 years ago   . Drug use: No  . Sexual activity: Not  Currently  Other Topics Concern  . Not on file  Social History Narrative  . Not on file   Social Determinants of Health   Financial Resource Strain:   . Difficulty of Paying Living Expenses:   Food Insecurity:   . Worried About Charity fundraiser in the Last Year:   . Arboriculturist in the Last Year:   Transportation Needs:   . Film/video editor (Medical):   [name] Kitchen Lack of Transportation (Non-Medical):   Physical Activity:   . Days of Exercise per Week:   . Minutes of Exercise per Session:   Stress:   . Feeling of Stress :   Social Connections:   . Frequency of Communication with Friends and Family:   . Frequency of Social Gatherings with Friends and Family:   . Attends Religious Services:   . Active Member of Clubs or Organizations:   . Attends Archivist Meetings:   [name] Kitchen Marital Status:   Intimate Partner Violence:   . Fear of Current or Ex-Partner:   . Emotionally Abused:   [name] Kitchen Physically Abused:   . Sexually Abused:   Former smoker and smoked 1 pack per day for about 20 years starting at age 59 quit 22 years ago.  FAMILY HISTORY: No family history on file.  ALLERGIES:  has No Known Allergies.  MEDICATIONS:  Current Outpatient Medications  Medication Sig Dispense Refill  . ALPRAZolam (XANAX) 0.5 MG tablet Take 1 tablet (0.5 mg total) by mouth as directed. Take 2 tablets (1.0 mg total) prior to MR scan.  Make take an additional 0.5 mg po if needed. (Patient not taking: Reported on 07/28/2019) 3 tablet 0  . amLODipine (NORVASC) 5 MG tablet Take 1 tablet (5 mg total) by mouth daily. 30 tablet 2  . aspirin EC 81 MG tablet Take 81 mg by mouth every morning.     . cholecalciferol (VITAMIN D) 1000 UNITS tablet Take 1,000 Units by mouth every morning.     . Insulin Glargine (LANTUS SOLOSTAR) 100 UNIT/ML Solostar Pen Inject 32 Units into the skin daily.    [name] Kitchen lisinopril-hydrochlorothiazide (PRINZIDE,ZESTORETIC) 20-12.5 MG tablet Take 1 tablet by mouth every morning.       . metFORMIN (GLUCOPHAGE) 1000 MG tablet Take 1,000 mg by mouth 2 (two) times daily with a meal.    . Multiple Vitamin (MULTIVITAMIN WITH MINERALS) TABS tablet Take 1 tablet daily by mouth.    . oxyCODONE (OXY IR/ROXICODONE) 5 MG immediate release tablet Take 1 tablet (5 mg total) by mouth every 6 (six) hours as needed for up to 15 doses for moderate pain or severe pain. (Patient not taking: Reported on 07/28/2019) 15 tablet 0  . PAZEO 0.7 % SOLN Place 1 drop into both eyes every morning.   3  . simvastatin (ZOCOR) 40 MG tablet Take 20 mg by mouth every evening.  No current facility-administered medications for this visit.   Facility-Administered Medications Ordered in Other Visits  Medication Dose Route Frequency Provider Last Rate Last Admin  . bevacizumab-bvzr (ZIRABEV) 1,700 mg in sodium chloride 0.9 % 100 mL chemo infusion  15 mg/kg (Treatment Plan Recorded) Intravenous Once Brunetta Genera, MD 336 mL/hr at 11/15/19 1619 1,700 mg at 11/15/19 1619    REVIEW OF SYSTEMS:   A 10+ POINT REVIEW OF SYSTEMS WAS OBTAINED including neurology, dermatology, psychiatry, cardiac, respiratory, lymph, extremities, GI, GU, Musculoskeletal, constitutional, breasts, reproductive, HEENT.  All pertinent positives are noted in the HPI.  All others are negative.   PHYSICAL EXAMINATION: ECOG FS:1 - Symptomatic but completely ambulatory  There were no vitals filed for this visit. Wt Readings from Last 3 Encounters:  11/01/19 255 lb 8 oz (115.9 kg)  10/25/19 259 lb 4 oz (117.6 kg)  09/29/19 255 lb (115.7 kg)   There is no height or weight on file to calculate BMI.    Exam was given in a chair   GENERAL:alert, in no acute distress and comfortable SKIN: no acute rashes, no significant lesions EYES: conjunctiva are pink and non-injected, sclera anicteric OROPHARYNX: MMM, no exudates, no oropharyngeal erythema or ulceration NECK: supple, no JVD LYMPH:  no palpable lymphadenopathy in the cervical,  axillary or inguinal regions LUNGS: clear to auscultation b/l with normal respiratory effort HEART: regular rate & rhythm ABDOMEN:  normoactive bowel sounds , non tender, not distended. No palpable hepatosplenomegaly.  Extremity: no pedal edema PSYCH: alert & oriented x 3 with fluent speech NEURO: no focal motor/sensory deficits  LABORATORY DATA:  I have reviewed the data as listed  . CBC Latest Ref Rng & Units 11/15/2019 11/01/2019 10/25/2019  WBC 4.0 - 10.5 K/uL 5.3 4.8 5.7  Hemoglobin 13.0 - 17.0 g/dL 12.6(L) 11.6(L) 11.6(L)  Hematocrit 39.0 - 52.0 % 41.6 38.1(L) 38.2(L)  Platelets 150 - 400 K/uL 311 276 272   . CBC    Component Value Date/Time   WBC 5.3 11/15/2019 1405   RBC 5.68 11/15/2019 1405   HGB 12.6 (L) 11/15/2019 1405   HGB 11.6 (L) 11/01/2019 0806   HGB 11.5 (L) 03/22/2017 1402   HCT 41.6 11/15/2019 1405   HCT 37.0 (L) 03/22/2017 1402   PLT 311 11/15/2019 1405   PLT 276 11/01/2019 0806   PLT 262 03/22/2017 1402   MCV 73.2 (L) 11/15/2019 1405   MCV 72.3 (L) 03/22/2017 1402   MCH 22.2 (L) 11/15/2019 1405   MCHC 30.3 11/15/2019 1405   RDW 17.6 (H) 11/15/2019 1405   RDW 15.6 (H) 03/22/2017 1402   LYMPHSABS 1.5 11/15/2019 1405   LYMPHSABS 1.8 03/22/2017 1402   MONOABS 0.3 11/15/2019 1405   MONOABS 0.4 03/22/2017 1402   EOSABS 0.1 11/15/2019 1405   EOSABS 0.1 03/22/2017 1402   BASOSABS 0.0 11/15/2019 1405   BASOSABS 0.0 03/22/2017 1402    . CMP Latest Ref Rng & Units 11/15/2019 11/01/2019 10/25/2019  Glucose 70 - 99 mg/dL 189(H) 137(H) 165(H)  BUN 8 - 23 mg/dL 27(H) 25(H) 19  Creatinine 0.61 - 1.24 mg/dL 1.59(H) 1.56(H) 1.44(H)  Sodium 135 - 145 mmol/L 138 139 136  Potassium 3.5 - 5.1 mmol/L 4.7 4.7 4.2  Chloride 98 - 111 mmol/L 102 101 101  CO2 22 - 32 mmol/L _0 Calcium 8.9 - 10.3 mg/dL 9.8 9.5 9.6  Total Protein 6.5 - 8.1 g/dL 8.2(H) 7.9 8.0  Total Bilirubin 0.3 - 1.2 mg/dL 0.5 0.6 0.6  Alkaline Phos 38 - 126 U/L 47 45 43  AST 15 - 41 U/L 36 46(H)  42(H)  ALT 0 - 44 U/L 25 39 35    06/21/2019 MR ABDOMEN WWO CONTRAST (Accession [id number])     IFE 1  Comment   Comments: Immunofixation shows IgG monoclonal protein with kappa light chain  specificity.           RADIOGRAPHIC STUDIES:  MRI abd w and wo contrast: 12/17/2016: IMPRESSION: 1. Postprocedural changes of thermal ablation again noted in the right lobe of the liver, without definitive evidence to suggest local recurrence of disease. No new hepatic lesions are noted. 2. Additional incidental findings, as above.   Electronically Signed   By: Vinnie Langton M.D.   On: 12/17/2016 09:42  ASSESSMENT & PLAN:   74 y.o. male with  #1 Monoclonal gammopathy of undetermined significance.  M protein 0.2 mg/dL immunofixation showing IgG monoclonal protein with kappa light chain specificity. SPEP 02/2016 - 0.3g/dl  No overt anemia.  No overt hypercalcemia. Stable CKD creatinine 1.6  #2 bone lucencies in the right radial mid midshaft and left humeral head. PET/CT did not show any hypermetabolic bone lesions. MRI left shoulder showed no evidence of metastatic disease or multiple myeloma. The x-ray findings appear to be areas of mild demineralization. Patient was seen by orthopedics and no additional recommendations were given.  Plan -Patient's anemia is stable. No significant change in renal function. No new bone pains. No hypercalcemia . No overall no overt evidence of progression of multiple myeloma. -Myeloma labs from today show stable M protein @ 0.2g/dl with no evidence of progression- consistent with MGUS.  #3 Hepatocellular carcinoma treated with TACE and percutaneous thermal ablation by interventional radiology.   Last MRI on 08/06/2015 showed slight decrease in the size of the ablation defect involving the right lobe of the liver area and no findings to suggest residual or recurrent hepatocellular carcinoma. Mild changes of liver cirrhosis.  01/16/2016 MRI  Abdomen showed resolution of previously ablated lesion but a new 1.3 cm lesion was noted which was subsequently ablated under CT guidance by interventional radiology on 01/31/2016. Elevated transaminases on 02/01/2016 were likely related to his ablation. These have since resolved.   06/24/2016 MRI Abdomen showed no evidence of recurrent hepatocellular carcinoma . 12/17/2016 MRI Abdomen shows no evidence of recurrent hepatocellular carcinoma .  10/26/17 MRI Abdomen revealed Motion degraded images. Status post thermal ablation of the segment 8 lesion, without enhancement to suggest residual viable tumor. Prior ablation of a segment 7 lesion, grossly unchanged. No convincing central enhancement  03/14/2019  MRI abdomen w and wo contrast revealed "1. New areas of restricted diffusion, surrounding nodular arterial phase enhancement and delayed washout surrounding the ablation zone defects anteriorly in segments 8 and 7 consistent with local recurrence of hepatocellular carcinoma. 2. The peripheral ablation zone defect laterally in segment 8 is unchanged. 3. No extrahepatic tumor identified."  10/02/2019 MRI Abdomen ([id number]) revealed "1. Progressive multifocal hepatocellular carcinoma, especially anteriorly around the ablated lesion in segments 4B and 8. There are additional new lesions in segments 2 and 3. No evidence of extrahepatic metastatic disease."  #4 Hepatitis C genotype 1A status post treatment with Viekira and Ribavarin for 24 weeks.   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   PLAN:  -Discussed pt labwork today, 11/15/19; all values are WNL except for Hgb has improved, PLT and WBC are normal, Kidney numbers are stable, Liver  enzymes have improved -Discussed 11/15/2019 AFP tumor marker down to 98.1 from 126 -Discussed 11/15/2019 HCV RNA quant -- undetectable -The pt has no prohibitive toxicities from continuing C2 of  Atezolizumab + Avastin at this time. -Will reassess treatment effectiveness after 3-4 cycles -Will continue treatment until progression or intolerance  -Advised pt that Avastin is known to increase BP. Pt currently taking Lisinopri + HCTZ. Will add Amlodipine to keep BP controlled.  -Advised pt that Avastin could increase bleeding risk if a resection is necessary during a Colonoscopy and can increase healing time. -Not unreasonable to hold Colonoscopy for 4-6 months, unless new or changing bowel symptoms -Recommended pt continue to f/u with Dr. Anselm Pancoast  -Continue Lisinopril + HCTZ as prescribed -Continue daily Asprin  -Rx Amlodipine -Will see back in 3 weeks with labs    FOLLOW UP: Please schedule next cycle of treatment as ordered in 3 weeks with labs and MD visit   The total time spent in the appt was 30 minutes and more than 50% was on counseling and direct patient cares.  All of the patient's questions were answered with apparent satisfaction. The patient knows to call the clinic with any problems, questions or concerns.   [name] MD [place] AAHIVMS [facility] Hematology/Oncology Physician [facility]  (Office):       [phone] (Work cell):  [phone] (Fax):           [phone]   I, Yevette Edwards, am acting as a scribe for Dr. [name].   .I have reviewed the above documentation for accuracy and completeness, and I agree with the above. Brunetta Genera MD

## 2019-11-15 NOTE — Patient Instructions (Signed)
[facility] Discharge Instructions for Patients Receiving Chemotherapy  Today you received the following chemotherapy agents Bevacizumab and Tecentriq  To help prevent nausea and vomiting after your treatment, we encourage you to take your nausea medication as directed.   If you develop nausea and vomiting that is not controlled by your nausea medication, call the clinic.   BELOW ARE SYMPTOMS THAT SHOULD BE REPORTED IMMEDIATELY:  *FEVER GREATER THAN 100.5 F  *CHILLS WITH OR WITHOUT FEVER  NAUSEA AND VOMITING THAT IS NOT CONTROLLED WITH YOUR NAUSEA MEDICATION  *UNUSUAL SHORTNESS OF BREATH  *UNUSUAL BRUISING OR BLEEDING  TENDERNESS IN MOUTH AND THROAT WITH OR WITHOUT PRESENCE OF ULCERS  *URINARY PROBLEMS  *BOWEL PROBLEMS  UNUSUAL RASH Items with * indicate a potential emergency and should be followed up as soon as possible.  Feel free to call the clinic should you have any questions or concerns. The clinic phone number is [phone].  Please show the [facility] at check-in to the Emergency Department and triage nurse.  Atezolizumab injection What is this medicine? ATEZOLIZUMAB (a te zoe LIZ ue mab) is a monoclonal antibody. It is used to treat bladder cancer (urothelial cancer), liver cancer, lung cancer, breast cancer, and melanoma. This medicine may be used for other purposes; ask your health care provider or pharmacist if you have questions. COMMON BRAND NAME(S): Tecentriq What should I tell my health care provider before I take this medicine? They need to know if you have any of these conditions:  diabetes  immune system problems  infection  inflammatory bowel disease  liver disease  lung or breathing disease  lupus  nervous system problems like myasthenia gravis or Guillain-Barre syndrome  organ transplant  an unusual or allergic reaction to atezolizumab, other medicines, foods, dyes, or preservatives  pregnant or trying to get  pregnant  breast-feeding How should I use this medicine? This medicine is for infusion into a vein. It is given by a health care professional in a hospital or clinic setting. A special MedGuide will be given to you before each treatment. Be sure to read this information carefully each time. Talk to your pediatrician regarding the use of this medicine in children. Special care may be needed. Overdosage: If you think you have taken too much of this medicine contact a poison control center or emergency room at once. NOTE: This medicine is only for you. Do not share this medicine with others. What if I miss a dose? It is important not to miss your dose. Call your doctor or health care professional if you are unable to keep an appointment. What may interact with this medicine? Interactions have not been studied. This list may not describe all possible interactions. Give your health care provider a list of all the medicines, herbs, non-prescription drugs, or dietary supplements you use. Also tell them if you smoke, drink alcohol, or use illegal drugs. Some items may interact with your medicine. What should I watch for while using this medicine? Your condition will be monitored carefully while you are receiving this medicine. You may need blood work done while you are taking this medicine. Do not become pregnant while taking this medicine or for at least 5 months after stopping it. Women should inform their doctor if they wish to become pregnant or think they might be pregnant. There is a potential for serious side effects to an unborn child. Talk to your health care professional or pharmacist for more information. Do not breast-feed an  infant while taking this medicine or for at least 5 months after the last dose. What side effects may I notice from receiving this medicine? Side effects that you should report to your doctor or health care professional as soon as possible:  allergic reactions like skin  rash, itching or hives, swelling of the face, lips, or tongue  black, tarry stools  bloody or watery diarrhea  breathing problems  changes in vision  chest pain or chest tightness  chills  facial flushing  fever  headache  signs and symptoms of high blood sugar such as dizziness; dry mouth; dry skin; fruity breath; nausea; stomach pain; increased hunger or thirst; increased urination  signs and symptoms of liver injury like dark yellow or brown urine; general ill feeling or flu-like symptoms; light-colored stools; loss of appetite; nausea; right upper belly pain; unusually weak or tired; yellowing of the eyes or skin  stomach pain  trouble passing urine or change in the amount of urine Side effects that usually do not require medical attention (report to your doctor or health care professional if they continue or are bothersome):  bone pain  cough  diarrhea  joint pain  muscle pain  muscle weakness  swelling of arms or legs  tiredness  weight loss This list may not describe all possible side effects. Call your doctor for medical advice about side effects. You may report side effects to FDA at 1-800-FDA-1088. Where should I keep my medicine? This drug is given in a hospital or clinic and will not be stored at home. NOTE: This sheet is a summary. It may not cover all possible information. If you have questions about this medicine, talk to your doctor, pharmacist, or health care provider.  2020 [employer]/Gold Standard (2019-03-31 13:11:14)  Bevacizumab injection What is this medicine? BEVACIZUMAB (be va SIZ yoo mab) is a monoclonal antibody. It is used to treat many types of cancer. This medicine may be used for other purposes; ask your health care provider or pharmacist if you have questions. COMMON BRAND NAME(S): Avastin, MVASI, Zirabev What should I tell my health care provider before I take this medicine? They need to know if you have any of these  conditions:  diabetes  heart disease  high blood pressure  history of coughing up blood  prior anthracycline chemotherapy (e.g., doxorubicin, daunorubicin, epirubicin)  recent or ongoing radiation therapy  recent or planning to have surgery  stroke  an unusual or allergic reaction to bevacizumab, hamster proteins, mouse proteins, other medicines, foods, dyes, or preservatives  pregnant or trying to get pregnant  breast-feeding How should I use this medicine? This medicine is for infusion into a vein. It is given by a health care professional in a hospital or clinic setting. Talk to your pediatrician regarding the use of this medicine in children. Special care may be needed. Overdosage: If you think you have taken too much of this medicine contact a poison control center or emergency room at once. NOTE: This medicine is only for you. Do not share this medicine with others. What if I miss a dose? It is important not to miss your dose. Call your doctor or health care professional if you are unable to keep an appointment. What may interact with this medicine? Interactions are not expected. This list may not describe all possible interactions. Give your health care provider a list of all the medicines, herbs, non-prescription drugs, or dietary supplements you use. Also tell them if you smoke, drink alcohol, or  use illegal drugs. Some items may interact with your medicine. What should I watch for while using this medicine? Your condition will be monitored carefully while you are receiving this medicine. You will need important blood work and urine testing done while you are taking this medicine. This medicine may increase your risk to bruise or bleed. Call your doctor or health care professional if you notice any unusual bleeding. Before having surgery, talk to your health care provider to make sure it is ok. This drug can increase the risk of poor healing of your surgical site or  wound. You will need to stop this drug for 28 days before surgery. After surgery, wait at least 28 days before restarting this drug. Make sure the surgical site or wound is healed enough before restarting this drug. Talk to your health care provider if questions. Do not become pregnant while taking this medicine or for 6 months after stopping it. Women should inform their doctor if they wish to become pregnant or think they might be pregnant. There is a potential for serious side effects to an unborn child. Talk to your health care professional or pharmacist for more information. Do not breast-feed an infant while taking this medicine and for 6 months after the last dose. This medicine has caused ovarian failure in some women. This medicine may interfere with the ability to have a child. You should talk to your doctor or health care professional if you are concerned about your fertility. What side effects may I notice from receiving this medicine? Side effects that you should report to your doctor or health care professional as soon as possible:  allergic reactions like skin rash, itching or hives, swelling of the face, lips, or tongue  chest pain or chest tightness  chills  coughing up blood  high fever  seizures  severe constipation  signs and symptoms of bleeding such as bloody or black, tarry stools; red or dark-brown urine; spitting up blood or brown material that looks like coffee grounds; red spots on the skin; unusual bruising or bleeding from the eye, gums, or nose  signs and symptoms of a blood clot such as breathing problems; chest pain; severe, sudden headache; pain, swelling, warmth in the leg  signs and symptoms of a stroke like changes in vision; confusion; trouble speaking or understanding; severe headaches; sudden numbness or weakness of the face, arm or leg; trouble walking; dizziness; loss of balance or coordination  stomach pain  sweating  swelling of legs or  ankles  vomiting  weight gain Side effects that usually do not require medical attention (report to your doctor or health care professional if they continue or are bothersome):  back pain  changes in taste  decreased appetite  dry skin  nausea  tiredness This list may not describe all possible side effects. Call your doctor for medical advice about side effects. You may report side effects to FDA at 1-800-FDA-1088. Where should I keep my medicine? This drug is given in a hospital or clinic and will not be stored at home. NOTE: This sheet is a summary. It may not cover all possible information. If you have questions about this medicine, talk to your doctor, pharmacist, or health care provider.  2020 Elsevier/Gold Standard (2019-06-07 10:50:46)

## 2019-11-16 LAB — HCV RNA QUANT RFLX ULTRA OR GENOTYP
HCV RNA Qnt(log copy/mL): UNDETERMINED log10 IU/mL
HepC Qn: NOT DETECTED IU/mL

## 2019-11-16 LAB — AFP TUMOR MARKER: AFP, Serum, Tumor Marker: 98.1 ng/mL — ABNORMAL HIGH (ref 0.0–8.3)

## 2019-11-27 DIAGNOSIS — E119 Type 2 diabetes mellitus without complications: Secondary | ICD-10-CM | POA: Diagnosis not present

## 2019-11-27 DIAGNOSIS — E611 Iron deficiency: Secondary | ICD-10-CM | POA: Diagnosis not present

## 2019-11-27 DIAGNOSIS — I10 Essential (primary) hypertension: Secondary | ICD-10-CM | POA: Diagnosis not present

## 2019-11-28 ENCOUNTER — Other Ambulatory Visit: Payer: Medicare HMO

## 2019-11-28 ENCOUNTER — Ambulatory Visit: Payer: Medicare HMO | Admitting: Hematology

## 2019-11-28 ENCOUNTER — Other Ambulatory Visit: Payer: Self-pay | Admitting: *Deleted

## 2019-11-28 DIAGNOSIS — C220 Liver cell carcinoma: Secondary | ICD-10-CM

## 2019-11-28 DIAGNOSIS — D472 Monoclonal gammopathy: Secondary | ICD-10-CM

## 2019-11-28 NOTE — Progress Notes (Signed)
HEMATOLOGY/ONCOLOGY CLINIC NOTE  Date of Service: 11/29/2019  Patient Care Team: Jani Gravel, MD as PCP - General (Internal Medicine)  CHIEF COMPLAINTS/PURPOSE OF CONSULTATION:   [facility]- progression,toxicity check with avastin + atezolizumab  HISTORY OF PRESENTING ILLNESS:   Aaron [name] is a wonderful 74 y.o. male who has been referred to [place] by Dr Jani Gravel for evaluation and management of monoclonal gammopathy of undetermined significance.  Patient has history of hypertension, diabetes, dyslipidemia, hepatocellular carcinoma (rx with TACE and percutaneous thermal ablation), hepatitis C status post treatment, iron deficiency anemia in 2014 treated with oral iron.  Patient had an SPEP done by his primary care physician on 10/04/2015 that showed an M spike of 0.3 g/dL. It was presumably will be done due to his complaints of fatigue. He has had no significant anemia. No new bone pains. No fevers/chills/drenching night sweats.  No new anemia..  Outside labs show hemoglobin of 12.4 with microcytosis with an MCV of 70.5, normal WBC count of 5.1k.  No evidence of hypercalcemia or significant renal failure on his outside labs.   INTERVAL HISTORY  Aaron [name]  is here for his scheduled follow-up for MGUS and HCC. He is here prior to C3 of Atezolizumab + Avastin. The patient's last visit with [place] was on 11/15/2019. The pt reports that he is doing well overall.  The pt reports that he has been feeling, eating and sleeping well. Pt notes that he was tired after working in the yard for a few hours the other day. He had a red, non-itchy rash on his right forearm that went away on it's own in about 5 days. Pt has been monitoring his BP at home and feels that they have been stable. Pt has continued taking Lisinopril, Amlodipine, and HCTZ for blood pressure management. He has never seen a Nephrologist and his PCP has been managing his kidney dysfunction. Pt has an upcoming appointment with PCP on  12/07/2019. He had loose stools one day, but denies significant diarrhea.   Lab results today (11/29/19) of CBC w/diff and CMP is as follows: all values are WNL except for Hgb at 11.8, MCV at 72.2, MCH at 21.9, RDW at 17.1, Potassium at 5.3, Glucose at 170, BUN at 32, Creatinine at 1.94, GFR Est Af Am at 39. 11/29/2019 AFP is down to 89.4  On review of systems, pt reports loose stools, rash and denies diarrhea, low appetite, sleeplessness, abdominal pain and any other symptoms.   MEDICAL HISTORY:  Past Medical History:  Diagnosis Date  . Arthritis   . Diabetes mellitus without complication ([place])    type II   . Elevated liver enzymes   . Fatty liver   . Hepatitis C    genotype 1A status post treatment with Viekira and Ribavarin for 24 weeks  . Hepatocellular carcinoma ([place]) 01/30/2014   Path  . History of colon polyps   . Hyperlipidemia   . Hypertension   . Iron deficiency anemia    hx of   . MGUS (monoclonal gammopathy of unknown significance)    Obesity Hepatocellular carcinoma treated with TACE and percutaneous thermal ablation by interventional radiology. Last MRI on 08/06/2015 showed slight decrease in the size of the ablation defect involving the right lobe of the liver area and no findings to suggest residual or recurrent hepatocellular carcinoma. Mild changes of liver cirrhosis. MRI Abd 06/24/2016- no evidence of recurrent HCC   Hepatitis C genotype 1A status post treatment with Linzie Collin and Ribavarin for  24 weeks.  Monoclonal gammopathy of undetermined significance.  SURGICAL HISTORY:  Status post microwave ablation[October 2015] of liver lesion and TACE [July 2015] for [place]. EGD and colonoscopy in 2016   SOCIAL HISTORY: Social History   Socioeconomic History  . Marital status: Married    Spouse name: Not on file  . Number of children: Not on file  . Years of education: Not on file  . Highest education level: Not on file  Occupational History  . Not on file    Tobacco Use  . Smoking status: Former Smoker    Quit date: 01/30/1994    Years since quitting: 25.8  . Smokeless tobacco: Never Used  . Tobacco comment: stopped 25 yrs ago  Substance and Sexual Activity  . Alcohol use: No    Comment: stopped 30 years ago   . Drug use: No  . Sexual activity: Not Currently  Other Topics Concern  . Not on file  Social History Narrative  . Not on file   Social Determinants of Health   Financial Resource Strain:   . Difficulty of Paying Living Expenses:   Food Insecurity:   . Worried About Charity fundraiser in the Last Year:   . Arboriculturist in the Last Year:   Transportation Needs:   . Film/video editor (Medical):   [name] Kitchen Lack of Transportation (Non-Medical):   Physical Activity:   . Days of Exercise per Week:   . Minutes of Exercise per Session:   Stress:   . Feeling of Stress :   Social Connections:   . Frequency of Communication with Friends and Family:   . Frequency of Social Gatherings with Friends and Family:   . Attends Religious Services:   . Active Member of Clubs or Organizations:   . Attends Archivist Meetings:   [name] Kitchen Marital Status:   Intimate Partner Violence:   . Fear of Current or Ex-Partner:   . Emotionally Abused:   [name] Kitchen Physically Abused:   . Sexually Abused:   Former smoker and smoked 1 pack per day for about 20 years starting at age 52 quit 21 years ago.  FAMILY HISTORY: No family history on file.  ALLERGIES:  has No Known Allergies.  MEDICATIONS:  Current Outpatient Medications  Medication Sig Dispense Refill  . ALPRAZolam (XANAX) 0.5 MG tablet Take 1 tablet (0.5 mg total) by mouth as directed. Take 2 tablets (1.0 mg total) prior to MR scan.  Make take an additional 0.5 mg po if needed. (Patient not taking: Reported on 07/28/2019) 3 tablet 0  . amLODipine (NORVASC) 5 MG tablet Take 1 tablet (5 mg total) by mouth daily. 30 tablet 2  . aspirin EC 81 MG tablet Take 81 mg by mouth every morning.     .  cholecalciferol (VITAMIN D) 1000 UNITS tablet Take 1,000 Units by mouth every morning.     . Insulin Glargine (LANTUS SOLOSTAR) 100 UNIT/ML Solostar Pen Inject 32 Units into the skin daily.    [name] Kitchen lisinopril-hydrochlorothiazide (PRINZIDE,ZESTORETIC) 20-12.5 MG tablet Take 1 tablet by mouth every morning.     . metFORMIN (GLUCOPHAGE) 1000 MG tablet Take 1,000 mg by mouth 2 (two) times daily with a meal.    . Multiple Vitamin (MULTIVITAMIN WITH MINERALS) TABS tablet Take 1 tablet daily by mouth.    . oxyCODONE (OXY IR/ROXICODONE) 5 MG immediate release tablet Take 1 tablet (5 mg total) by mouth every 6 (six) hours as needed for up to 15 doses  for moderate pain or severe pain. (Patient not taking: Reported on 07/28/2019) 15 tablet 0  . PAZEO 0.7 % SOLN Place 1 drop into both eyes every morning.   3  . simvastatin (ZOCOR) 40 MG tablet Take 20 mg by mouth every evening.      No current facility-administered medications for this visit.    REVIEW OF SYSTEMS:   A 10+ POINT REVIEW OF SYSTEMS WAS OBTAINED including neurology, dermatology, psychiatry, cardiac, respiratory, lymph, extremities, GI, GU, Musculoskeletal, constitutional, breasts, reproductive, HEENT.  All pertinent positives are noted in the HPI.  All others are negative.   PHYSICAL EXAMINATION: ECOG FS:1 - Symptomatic but completely ambulatory  There were no vitals filed for this visit. Wt Readings from Last 3 Encounters:  11/01/19 255 lb 8 oz (115.9 kg)  10/25/19 259 lb 4 oz (117.6 kg)  09/29/19 255 lb (115.7 kg)   There is no height or weight on file to calculate BMI.    GENERAL:alert, in no acute distress and comfortable SKIN: no acute rashes, no significant lesions EYES: conjunctiva are pink and non-injected, sclera anicteric OROPHARYNX: MMM, no exudates, no oropharyngeal erythema or ulceration NECK: supple, no JVD LYMPH:  no palpable lymphadenopathy in the cervical, axillary or inguinal regions LUNGS: clear to auscultation b/l  with normal respiratory effort HEART: regular rate & rhythm ABDOMEN:  normoactive bowel sounds , non tender, not distended. No palpable hepatosplenomegaly.  Extremity: no pedal edema PSYCH: alert & oriented x 3 with fluent speech NEURO: no focal motor/sensory deficits  LABORATORY DATA:  I have reviewed the data as listed  . CBC Latest Ref Rng & Units 11/15/2019 11/01/2019 10/25/2019  WBC 4.0 - 10.5 K/uL 5.3 4.8 5.7  Hemoglobin 13.0 - 17.0 g/dL 12.6(L) 11.6(L) 11.6(L)  Hematocrit 39.0 - 52.0 % 41.6 38.1(L) 38.2(L)  Platelets 150 - 400 K/uL 311 276 272   . CBC    Component Value Date/Time   WBC 5.3 11/15/2019 1405   RBC 5.68 11/15/2019 1405   HGB 12.6 (L) 11/15/2019 1405   HGB 11.6 (L) 11/01/2019 0806   HGB 11.5 (L) 03/22/2017 1402   HCT 41.6 11/15/2019 1405   HCT 37.0 (L) 03/22/2017 1402   PLT 311 11/15/2019 1405   PLT 276 11/01/2019 0806   PLT 262 03/22/2017 1402   MCV 73.2 (L) 11/15/2019 1405   MCV 72.3 (L) 03/22/2017 1402   MCH 22.2 (L) 11/15/2019 1405   MCHC 30.3 11/15/2019 1405   RDW 17.6 (H) 11/15/2019 1405   RDW 15.6 (H) 03/22/2017 1402   LYMPHSABS 1.5 11/15/2019 1405   LYMPHSABS 1.8 03/22/2017 1402   MONOABS 0.3 11/15/2019 1405   MONOABS 0.4 03/22/2017 1402   EOSABS 0.1 11/15/2019 1405   EOSABS 0.1 03/22/2017 1402   BASOSABS 0.0 11/15/2019 1405   BASOSABS 0.0 03/22/2017 1402    . CMP Latest Ref Rng & Units 11/15/2019 11/01/2019 10/25/2019  Glucose 70 - 99 mg/dL 189(H) 137(H) 165(H)  BUN 8 - 23 mg/dL 27(H) 25(H) 19  Creatinine 0.61 - 1.24 mg/dL 1.59(H) 1.56(H) 1.44(H)  Sodium 135 - 145 mmol/L 138 139 136  Potassium 3.5 - 5.1 mmol/L 4.7 4.7 4.2  Chloride 98 - 111 mmol/L 102 101 101  CO2 22 - 32 mmol/L 25 29 26   Calcium 8.9 - 10.3 mg/dL 9.8 9.5 9.6  Total Protein 6.5 - 8.1 g/dL 8.2(H) 7.9 8.0  Total Bilirubin 0.3 - 1.2 mg/dL 0.5 0.6 0.6  Alkaline Phos 38 - 126 U/L 47 45 43  AST 15 -  41 U/L 36 46(H) 42(H)  ALT 0 - 44 U/L 25 39 35    06/21/2019 MR ABDOMEN WWO  CONTRAST (Accession [id number])     IFE 1  Comment   Comments: Immunofixation shows IgG monoclonal protein with kappa light chain  specificity.           RADIOGRAPHIC STUDIES:  MRI abd w and wo contrast: 12/17/2016: IMPRESSION: 1. Postprocedural changes of thermal ablation again noted in the right lobe of the liver, without definitive evidence to suggest local recurrence of disease. No new hepatic lesions are noted. 2. Additional incidental findings, as above.   Electronically Signed   By: Vinnie Langton M.D.   On: 12/17/2016 09:42  ASSESSMENT & PLAN:   74 y.o. male with  #1 Monoclonal gammopathy of undetermined significance.  M protein 0.2 mg/dL immunofixation showing IgG monoclonal protein with kappa light chain specificity. SPEP 02/2016 - 0.3g/dl  No overt anemia.  No overt hypercalcemia. Stable CKD creatinine 1.6  #2 bone lucencies in the right radial mid midshaft and left humeral head. PET/CT did not show any hypermetabolic bone lesions. MRI left shoulder showed no evidence of metastatic disease or multiple myeloma. The x-ray findings appear to be areas of mild demineralization. Patient was seen by orthopedics and no additional recommendations were given.  Plan -Patient's anemia is stable. No significant change in renal function. No new bone pains. No hypercalcemia . No overall no overt evidence of progression of multiple myeloma. -Myeloma labs from today show stable M protein @ 0.2g/dl with no evidence of progression- consistent with MGUS.  #3 Hepatocellular carcinoma treated with TACE and percutaneous thermal ablation by interventional radiology.   Last MRI on 08/06/2015 showed slight decrease in the size of the ablation defect involving the right lobe of the liver area and no findings to suggest residual or recurrent hepatocellular carcinoma. Mild changes of liver cirrhosis.  01/16/2016 MRI Abdomen showed resolution of previously ablated lesion but a new 1.3  cm lesion was noted which was subsequently ablated under CT guidance by interventional radiology on 01/31/2016. Elevated transaminases on 02/01/2016 were likely related to his ablation. These have since resolved.   06/24/2016 MRI Abdomen showed no evidence of recurrent hepatocellular carcinoma . 12/17/2016 MRI Abdomen shows no evidence of recurrent hepatocellular carcinoma .  10/26/17 MRI Abdomen revealed Motion degraded images. Status post thermal ablation of the segment 8 lesion, without enhancement to suggest residual viable tumor. Prior ablation of a segment 7 lesion, grossly unchanged. No convincing central enhancement  03/14/2019  MRI abdomen w and wo contrast revealed "1. New areas of restricted diffusion, surrounding nodular arterial phase enhancement and delayed washout surrounding the ablation zone defects anteriorly in segments 8 and 7 consistent with local recurrence of hepatocellular carcinoma. 2. The peripheral ablation zone defect laterally in segment 8 is unchanged. 3. No extrahepatic tumor identified."  10/02/2019 MRI Abdomen ([id number]) revealed "1. Progressive multifocal hepatocellular carcinoma, especially anteriorly around the ablated lesion in segments 4B and 8. There are additional new lesions in segments 2 and 3. No evidence of extrahepatic metastatic disease."  #4 Hepatitis C genotype 1A status post treatment with Viekira and Ribavarin for 24 weeks.   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   PLAN:  -Discussed pt labwork today, 11/29/19; blood counts are stable, kidney numbers increased slightly, liver functions look okay, potassium high  -Discussed 11/29/2019 AFP is in progress, 11/15/2019 AFP trending down at 98.1 -Discussed 11/15/2019 HCV RNA was  negative. Pt's Hep C is still in SVR -Pt appears to be dehydrated. Recommend pt drink at least 48-64 oz of water per day. -Recommend pt limit high Potassium  containing foods such as bananas, oranges and tomatoes.  -The pt has no prohibitive toxicities from continuing C3 of Atezolizumab + Avastin on 12/06/19. -Will repeat scans after C4 of Atezolizumab + Avastin.  -Will continue treatment until progression or intolerance  -Recommended pt continue to f/u with Dr. Anselm Pancoast  -Recommend pt f/u with PCP as scheduled to evaluate CKD -Recommend pt increase Amlodipine dosage to 10 mg per day  -Continue Lisinopril + HCTZ as prescribed -Continue daily Asprin  -Will see back in 4 weeks with labs    FOLLOW UP: F/u with 3rd cycle of Avastin/Atezolizumab with labs as scheduled on 4/14 May cancel MD visit on 4/14 as patient was seen today Plz schedule cycle 4 of treatment as per orders with labs and MD visit   The total time spent in the appt was 25 minutes and more than 50% was on counseling and direct patient cares.  All of the patient's questions were answered with apparent satisfaction. The patient knows to call the clinic with any problems, questions or concerns.   [name] MD [place] AAHIVMS [facility] Hematology/Oncology Physician [facility]  (Office):       [phone] (Work cell):  [phone] (Fax):           [phone]   I, Yevette Edwards, am acting as a scribe for Dr. [name].   .I have reviewed the above documentation for accuracy and completeness, and I agree with the above. Brunetta Genera MD

## 2019-11-29 ENCOUNTER — Inpatient Hospital Stay: Payer: Medicare HMO | Attending: Hematology

## 2019-11-29 ENCOUNTER — Encounter: Payer: Self-pay | Admitting: Hematology

## 2019-11-29 ENCOUNTER — Other Ambulatory Visit: Payer: Self-pay

## 2019-11-29 ENCOUNTER — Inpatient Hospital Stay: Payer: Medicare HMO | Admitting: Hematology

## 2019-11-29 VITALS — BP 173/70 | HR 78 | Temp 98.0°F | Resp 18 | Ht 72.0 in | Wt 249.4 lb

## 2019-11-29 DIAGNOSIS — K7460 Unspecified cirrhosis of liver: Secondary | ICD-10-CM | POA: Insufficient documentation

## 2019-11-29 DIAGNOSIS — B1920 Unspecified viral hepatitis C without hepatic coma: Secondary | ICD-10-CM | POA: Insufficient documentation

## 2019-11-29 DIAGNOSIS — C220 Liver cell carcinoma: Secondary | ICD-10-CM

## 2019-11-29 DIAGNOSIS — D472 Monoclonal gammopathy: Secondary | ICD-10-CM

## 2019-11-29 DIAGNOSIS — E1122 Type 2 diabetes mellitus with diabetic chronic kidney disease: Secondary | ICD-10-CM | POA: Diagnosis not present

## 2019-11-29 DIAGNOSIS — D649 Anemia, unspecified: Secondary | ICD-10-CM | POA: Diagnosis not present

## 2019-11-29 DIAGNOSIS — N189 Chronic kidney disease, unspecified: Secondary | ICD-10-CM | POA: Insufficient documentation

## 2019-11-29 DIAGNOSIS — Z5112 Encounter for antineoplastic immunotherapy: Secondary | ICD-10-CM | POA: Insufficient documentation

## 2019-11-29 DIAGNOSIS — I129 Hypertensive chronic kidney disease with stage 1 through stage 4 chronic kidney disease, or unspecified chronic kidney disease: Secondary | ICD-10-CM | POA: Diagnosis not present

## 2019-11-29 LAB — CBC WITH DIFFERENTIAL (CANCER CENTER ONLY)
Abs Immature Granulocytes: 0.02 10*3/uL (ref 0.00–0.07)
Basophils Absolute: 0 10*3/uL (ref 0.0–0.1)
Basophils Relative: 1 %
Eosinophils Absolute: 0.1 10*3/uL (ref 0.0–0.5)
Eosinophils Relative: 3 %
HCT: 39 % (ref 39.0–52.0)
Hemoglobin: 11.8 g/dL — ABNORMAL LOW (ref 13.0–17.0)
Immature Granulocytes: 0 %
Lymphocytes Relative: 31 %
Lymphs Abs: 1.5 10*3/uL (ref 0.7–4.0)
MCH: 21.9 pg — ABNORMAL LOW (ref 26.0–34.0)
MCHC: 30.3 g/dL (ref 30.0–36.0)
MCV: 72.2 fL — ABNORMAL LOW (ref 80.0–100.0)
Monocytes Absolute: 0.5 10*3/uL (ref 0.1–1.0)
Monocytes Relative: 10 %
Neutro Abs: 2.7 10*3/uL (ref 1.7–7.7)
Neutrophils Relative %: 55 %
Platelet Count: 232 10*3/uL (ref 150–400)
RBC: 5.4 MIL/uL (ref 4.22–5.81)
RDW: 17.1 % — ABNORMAL HIGH (ref 11.5–15.5)
WBC Count: 4.8 10*3/uL (ref 4.0–10.5)
nRBC: 0 % (ref 0.0–0.2)

## 2019-11-29 LAB — CMP (CANCER CENTER ONLY)
ALT: 20 U/L (ref 0–44)
AST: 40 U/L (ref 15–41)
Albumin: 4.1 g/dL (ref 3.5–5.0)
Alkaline Phosphatase: 39 U/L (ref 38–126)
Anion gap: 10 (ref 5–15)
BUN: 32 mg/dL — ABNORMAL HIGH (ref 8–23)
CO2: 29 mmol/L (ref 22–32)
Calcium: 10 mg/dL (ref 8.9–10.3)
Chloride: 103 mmol/L (ref 98–111)
Creatinine: 1.94 mg/dL — ABNORMAL HIGH (ref 0.61–1.24)
GFR, Est AFR Am: 39 mL/min — ABNORMAL LOW (ref >60)
GFR, Estimated: 33 mL/min — ABNORMAL LOW (ref >60)
Glucose, Bld: 170 mg/dL — ABNORMAL HIGH (ref 70–99)
Potassium: 5.3 mmol/L — ABNORMAL HIGH (ref 3.5–5.1)
Sodium: 142 mmol/L (ref 135–145)
Total Bilirubin: 0.6 mg/dL (ref 0.3–1.2)
Total Protein: 8.1 g/dL (ref 6.5–8.1)

## 2019-11-29 MED ORDER — AMLODIPINE BESYLATE 10 MG PO TABS: 10.0000 mg | ORAL_TABLET | Freq: Every day | ORAL | 3 refills | Status: DC

## 2019-11-29 NOTE — Progress Notes (Signed)
Met with patient at registration to introduce myself as Arboriculturist and to offer available resources.  Discussed one-time $1000 [employer] grant an qualifications to assist with personal expenses while going through treatment.  Gave him my card if interested in applying and for any additional financial questions or concerns.

## 2019-11-30 LAB — AFP TUMOR MARKER: AFP, Serum, Tumor Marker: 89.4 ng/mL — ABNORMAL HIGH (ref 0.0–8.3)

## 2019-12-01 NOTE — Progress Notes (Signed)
Pharmacist Chemotherapy Monitoring - Follow Up Assessment    I verify that I have reviewed each item in the below checklist:  . Regimen for the patient is scheduled for the appropriate day and plan matches scheduled date. [name] Kitchen Appropriate non-routine labs are ordered dependent on drug ordered. . If applicable, additional medications reviewed and ordered per protocol based on lifetime cumulative doses and/or treatment regimen.   Plan for follow-up and/or issues identified: No . I-vent associated with next due treatment: No . MD and/or nursing notified: No  Acquanetta Belling 12/01/2019 8:16 AM

## 2019-12-05 ENCOUNTER — Other Ambulatory Visit: Payer: Self-pay | Admitting: *Deleted

## 2019-12-05 DIAGNOSIS — C220 Liver cell carcinoma: Secondary | ICD-10-CM

## 2019-12-05 DIAGNOSIS — I10 Essential (primary) hypertension: Secondary | ICD-10-CM | POA: Diagnosis not present

## 2019-12-05 DIAGNOSIS — E119 Type 2 diabetes mellitus without complications: Secondary | ICD-10-CM | POA: Diagnosis not present

## 2019-12-05 DIAGNOSIS — D472 Monoclonal gammopathy: Secondary | ICD-10-CM

## 2019-12-05 DIAGNOSIS — E7800 Pure hypercholesterolemia, unspecified: Secondary | ICD-10-CM | POA: Diagnosis not present

## 2019-12-05 NOTE — Progress Notes (Signed)
Pharmacist Chemotherapy Monitoring - Follow Up Assessment    I verify that I have reviewed each item in the below checklist:  . Regimen for the patient is scheduled for the appropriate day and plan matches scheduled date. [name] Kitchen Appropriate non-routine labs are ordered dependent on drug ordered. . If applicable, additional medications reviewed and ordered per protocol based on lifetime cumulative doses and/or treatment regimen.   Plan for follow-up and/or issues identified: No . I-vent associated with next due treatment: No . MD and/or nursing notified: No  Delane Ginger 12/05/2019 9:25 AM

## 2019-12-06 ENCOUNTER — Ambulatory Visit: Payer: Medicare HMO

## 2019-12-06 ENCOUNTER — Other Ambulatory Visit: Payer: Self-pay | Admitting: Hematology

## 2019-12-06 ENCOUNTER — Other Ambulatory Visit: Payer: Self-pay

## 2019-12-06 ENCOUNTER — Inpatient Hospital Stay: Payer: Medicare HMO

## 2019-12-06 ENCOUNTER — Ambulatory Visit: Payer: Medicare HMO | Admitting: Hematology

## 2019-12-06 VITALS — BP 156/73 | HR 74 | Temp 98.9°F | Resp 17

## 2019-12-06 DIAGNOSIS — B1920 Unspecified viral hepatitis C without hepatic coma: Secondary | ICD-10-CM | POA: Diagnosis not present

## 2019-12-06 DIAGNOSIS — Z5112 Encounter for antineoplastic immunotherapy: Secondary | ICD-10-CM | POA: Diagnosis not present

## 2019-12-06 DIAGNOSIS — K7460 Unspecified cirrhosis of liver: Secondary | ICD-10-CM | POA: Diagnosis not present

## 2019-12-06 DIAGNOSIS — I129 Hypertensive chronic kidney disease with stage 1 through stage 4 chronic kidney disease, or unspecified chronic kidney disease: Secondary | ICD-10-CM | POA: Diagnosis not present

## 2019-12-06 DIAGNOSIS — D472 Monoclonal gammopathy: Secondary | ICD-10-CM

## 2019-12-06 DIAGNOSIS — Z7189 Other specified counseling: Secondary | ICD-10-CM

## 2019-12-06 DIAGNOSIS — N189 Chronic kidney disease, unspecified: Secondary | ICD-10-CM | POA: Diagnosis not present

## 2019-12-06 DIAGNOSIS — D649 Anemia, unspecified: Secondary | ICD-10-CM | POA: Diagnosis not present

## 2019-12-06 DIAGNOSIS — C220 Liver cell carcinoma: Secondary | ICD-10-CM

## 2019-12-06 DIAGNOSIS — E1122 Type 2 diabetes mellitus with diabetic chronic kidney disease: Secondary | ICD-10-CM | POA: Diagnosis not present

## 2019-12-06 LAB — CBC WITH DIFFERENTIAL (CANCER CENTER ONLY)
Abs Immature Granulocytes: 0.02 10*3/uL (ref 0.00–0.07)
Basophils Absolute: 0 10*3/uL (ref 0.0–0.1)
Basophils Relative: 1 %
Eosinophils Absolute: 0.2 10*3/uL (ref 0.0–0.5)
Eosinophils Relative: 3 %
HCT: 40.6 % (ref 39.0–52.0)
Hemoglobin: 12.3 g/dL — ABNORMAL LOW (ref 13.0–17.0)
Immature Granulocytes: 0 %
Lymphocytes Relative: 29 %
Lymphs Abs: 1.6 10*3/uL (ref 0.7–4.0)
MCH: 22.3 pg — ABNORMAL LOW (ref 26.0–34.0)
MCHC: 30.3 g/dL (ref 30.0–36.0)
MCV: 73.7 fL — ABNORMAL LOW (ref 80.0–100.0)
Monocytes Absolute: 0.4 10*3/uL (ref 0.1–1.0)
Monocytes Relative: 7 %
Neutro Abs: 3.2 10*3/uL (ref 1.7–7.7)
Neutrophils Relative %: 60 %
Platelet Count: 261 10*3/uL (ref 150–400)
RBC: 5.51 MIL/uL (ref 4.22–5.81)
RDW: 17 % — ABNORMAL HIGH (ref 11.5–15.5)
WBC Count: 5.3 10*3/uL (ref 4.0–10.5)
nRBC: 0 % (ref 0.0–0.2)

## 2019-12-06 LAB — CMP (CANCER CENTER ONLY)
ALT: 23 U/L (ref 0–44)
AST: 38 U/L (ref 15–41)
Albumin: 4.4 g/dL (ref 3.5–5.0)
Alkaline Phosphatase: 46 U/L (ref 38–126)
Anion gap: 11 (ref 5–15)
BUN: 28 mg/dL — ABNORMAL HIGH (ref 8–23)
CO2: 24 mmol/L (ref 22–32)
Calcium: 9.7 mg/dL (ref 8.9–10.3)
Chloride: 107 mmol/L (ref 98–111)
Creatinine: 1.68 mg/dL — ABNORMAL HIGH (ref 0.61–1.24)
GFR, Est AFR Am: 46 mL/min — ABNORMAL LOW (ref >60)
GFR, Estimated: 40 mL/min — ABNORMAL LOW (ref >60)
Glucose, Bld: 137 mg/dL — ABNORMAL HIGH (ref 70–99)
Potassium: 4.4 mmol/L (ref 3.5–5.1)
Sodium: 142 mmol/L (ref 135–145)
Total Bilirubin: 0.6 mg/dL (ref 0.3–1.2)
Total Protein: 8.5 g/dL — ABNORMAL HIGH (ref 6.5–8.1)

## 2019-12-06 MED ORDER — SODIUM CHLORIDE 0.9 % IV SOLN
Freq: Once | INTRAVENOUS | Status: AC
  Filled 2019-12-06: qty 250

## 2019-12-06 MED ORDER — SODIUM CHLORIDE 0.9 % IV SOLN
15.0000 mg/kg | Freq: Once | INTRAVENOUS | Status: AC
  Administered 2019-12-06: 1700 mg via INTRAVENOUS
  Filled 2019-12-06: qty 64

## 2019-12-06 MED ORDER — SODIUM CHLORIDE 0.9 % IV SOLN
1200.0000 mg | Freq: Once | INTRAVENOUS | Status: AC
  Administered 2019-12-06: 1200 mg via INTRAVENOUS
  Filled 2019-12-06: qty 20

## 2019-12-06 NOTE — Patient Instructions (Signed)
[facility] Discharge Instructions for Patients Receiving Chemotherapy  Today you received the following chemotherapy agents: Tecentriq and Avastin.  To help prevent nausea and vomiting after your treatment, we encourage you to take your nausea medication as directed.   If you develop nausea and vomiting that is not controlled by your nausea medication, call the clinic.   BELOW ARE SYMPTOMS THAT SHOULD BE REPORTED IMMEDIATELY:  *FEVER GREATER THAN 100.5 F  *CHILLS WITH OR WITHOUT FEVER  NAUSEA AND VOMITING THAT IS NOT CONTROLLED WITH YOUR NAUSEA MEDICATION  *UNUSUAL SHORTNESS OF BREATH  *UNUSUAL BRUISING OR BLEEDING  TENDERNESS IN MOUTH AND THROAT WITH OR WITHOUT PRESENCE OF ULCERS  *URINARY PROBLEMS  *BOWEL PROBLEMS  UNUSUAL RASH Items with * indicate a potential emergency and should be followed up as soon as possible.  Feel free to call the clinic should you have any questions or concerns. The clinic phone number is [phone].  Please show the [place] at check-in to the Emergency Department and triage nurse.

## 2019-12-07 LAB — AFP TUMOR MARKER: AFP, Serum, Tumor Marker: 119 ng/mL — ABNORMAL HIGH (ref 0.0–8.3)

## 2019-12-21 NOTE — Progress Notes (Signed)
Pharmacist Chemotherapy Monitoring - Follow Up Assessment    I verify that I have reviewed each item in the below checklist:  . Regimen for the patient is scheduled for the appropriate day and plan matches scheduled date. [name] Kitchen Appropriate non-routine labs are ordered dependent on drug ordered. . If applicable, additional medications reviewed and ordered per protocol based on lifetime cumulative doses and/or treatment regimen.   Plan for follow-up and/or issues identified: No . I-vent associated with next due treatment: No . MD and/or nursing notified: No  Philomena Course 12/21/2019 9:27 AM

## 2019-12-27 ENCOUNTER — Other Ambulatory Visit: Payer: Self-pay | Admitting: *Deleted

## 2019-12-27 ENCOUNTER — Other Ambulatory Visit: Payer: Self-pay

## 2019-12-27 ENCOUNTER — Inpatient Hospital Stay: Payer: Medicare HMO | Attending: Hematology

## 2019-12-27 ENCOUNTER — Inpatient Hospital Stay: Payer: Medicare HMO

## 2019-12-27 ENCOUNTER — Inpatient Hospital Stay: Payer: Medicare HMO | Admitting: Hematology

## 2019-12-27 VITALS — BP 151/72 | HR 74 | Temp 98.5°F | Resp 20 | Ht 72.0 in | Wt 251.2 lb

## 2019-12-27 DIAGNOSIS — I129 Hypertensive chronic kidney disease with stage 1 through stage 4 chronic kidney disease, or unspecified chronic kidney disease: Secondary | ICD-10-CM | POA: Insufficient documentation

## 2019-12-27 DIAGNOSIS — D472 Monoclonal gammopathy: Secondary | ICD-10-CM | POA: Diagnosis not present

## 2019-12-27 DIAGNOSIS — E669 Obesity, unspecified: Secondary | ICD-10-CM | POA: Insufficient documentation

## 2019-12-27 DIAGNOSIS — N189 Chronic kidney disease, unspecified: Secondary | ICD-10-CM | POA: Insufficient documentation

## 2019-12-27 DIAGNOSIS — Z8619 Personal history of other infectious and parasitic diseases: Secondary | ICD-10-CM | POA: Diagnosis not present

## 2019-12-27 DIAGNOSIS — Z5112 Encounter for antineoplastic immunotherapy: Secondary | ICD-10-CM | POA: Diagnosis not present

## 2019-12-27 DIAGNOSIS — C220 Liver cell carcinoma: Secondary | ICD-10-CM

## 2019-12-27 DIAGNOSIS — D509 Iron deficiency anemia, unspecified: Secondary | ICD-10-CM | POA: Insufficient documentation

## 2019-12-27 DIAGNOSIS — Z79899 Other long term (current) drug therapy: Secondary | ICD-10-CM | POA: Diagnosis not present

## 2019-12-27 DIAGNOSIS — E1122 Type 2 diabetes mellitus with diabetic chronic kidney disease: Secondary | ICD-10-CM | POA: Insufficient documentation

## 2019-12-27 DIAGNOSIS — Z7189 Other specified counseling: Secondary | ICD-10-CM

## 2019-12-27 LAB — CBC WITH DIFFERENTIAL (CANCER CENTER ONLY)
Abs Immature Granulocytes: 0.01 10*3/uL (ref 0.00–0.07)
Basophils Absolute: 0.1 10*3/uL (ref 0.0–0.1)
Basophils Relative: 1 %
Eosinophils Absolute: 0.1 10*3/uL (ref 0.0–0.5)
Eosinophils Relative: 2 %
HCT: 41.6 % (ref 39.0–52.0)
Hemoglobin: 12.6 g/dL — ABNORMAL LOW (ref 13.0–17.0)
Immature Granulocytes: 0 %
Lymphocytes Relative: 27 %
Lymphs Abs: 1.4 10*3/uL (ref 0.7–4.0)
MCH: 22.3 pg — ABNORMAL LOW (ref 26.0–34.0)
MCHC: 30.3 g/dL (ref 30.0–36.0)
MCV: 73.8 fL — ABNORMAL LOW (ref 80.0–100.0)
Monocytes Absolute: 0.4 10*3/uL (ref 0.1–1.0)
Monocytes Relative: 8 %
Neutro Abs: 3.1 10*3/uL (ref 1.7–7.7)
Neutrophils Relative %: 62 %
Platelet Count: 256 10*3/uL (ref 150–400)
RBC: 5.64 MIL/uL (ref 4.22–5.81)
RDW: 15.9 % — ABNORMAL HIGH (ref 11.5–15.5)
WBC Count: 5.1 10*3/uL (ref 4.0–10.5)
nRBC: 0 % (ref 0.0–0.2)

## 2019-12-27 LAB — CMP (CANCER CENTER ONLY)
ALT: 19 U/L (ref 0–44)
AST: 34 U/L (ref 15–41)
Albumin: 4.1 g/dL (ref 3.5–5.0)
Alkaline Phosphatase: 43 U/L (ref 38–126)
Anion gap: 9 (ref 5–15)
BUN: 24 mg/dL — ABNORMAL HIGH (ref 8–23)
CO2: 25 mmol/L (ref 22–32)
Calcium: 9.8 mg/dL (ref 8.9–10.3)
Chloride: 105 mmol/L (ref 98–111)
Creatinine: 1.99 mg/dL — ABNORMAL HIGH (ref 0.61–1.24)
GFR, Est AFR Am: 37 mL/min — ABNORMAL LOW (ref >60)
GFR, Estimated: 32 mL/min — ABNORMAL LOW (ref >60)
Glucose, Bld: 157 mg/dL — ABNORMAL HIGH (ref 70–99)
Potassium: 4.7 mmol/L (ref 3.5–5.1)
Sodium: 139 mmol/L (ref 135–145)
Total Bilirubin: 0.5 mg/dL (ref 0.3–1.2)
Total Protein: 8.3 g/dL — ABNORMAL HIGH (ref 6.5–8.1)

## 2019-12-27 LAB — TSH: TSH: 0.993 u[IU]/mL (ref 0.320–4.118)

## 2019-12-27 MED ORDER — SODIUM CHLORIDE 0.9 % IV SOLN
15.0000 mg/kg | Freq: Once | INTRAVENOUS | Status: AC
  Administered 2019-12-27: 1700 mg via INTRAVENOUS
  Filled 2019-12-27: qty 64

## 2019-12-27 MED ORDER — SODIUM CHLORIDE 0.9 % IV SOLN
1200.0000 mg | Freq: Once | INTRAVENOUS | Status: AC
  Administered 2019-12-27: 1200 mg via INTRAVENOUS
  Filled 2019-12-27: qty 20

## 2019-12-27 MED ORDER — SODIUM CHLORIDE 0.9 % IV SOLN
Freq: Once | INTRAVENOUS | Status: AC
  Filled 2019-12-27: qty 250

## 2019-12-27 NOTE — Progress Notes (Signed)
HEMATOLOGY/ONCOLOGY CLINIC NOTE  Date of Service: 12/27/2019  Patient Care Team: Jani Gravel, MD as PCP - General (Internal Medicine)  CHIEF COMPLAINTS/PURPOSE OF CONSULTATION:   [facility]- progression,toxicity check with avastin + atezolizumab  HISTORY OF PRESENTING ILLNESS:   Aaron [name] is a wonderful 74 y.o. male who has been referred to [place] by Dr Jani Gravel for evaluation and management of monoclonal gammopathy of undetermined significance.  Patient has history of hypertension, diabetes, dyslipidemia, hepatocellular carcinoma (rx with TACE and percutaneous thermal ablation), hepatitis C status post treatment, iron deficiency anemia in 2014 treated with oral iron.  Patient had an SPEP done by his primary care physician on 10/04/2015 that showed an M spike of 0.3 g/dL. It was presumably will be done due to his complaints of fatigue. He has had no significant anemia. No new bone pains. No fevers/chills/drenching night sweats.  No new anemia..  Outside labs show hemoglobin of 12.4 with microcytosis with an MCV of 70.5, normal WBC count of 5.1k.  No evidence of hypercalcemia or significant renal failure on his outside labs.   INTERVAL HISTORY  Aaron [name]  is here for his scheduled follow-up for MGUS and HCC. He is here for to C4 of Atezolizumab + Avastin. The patient's last visit with [place] was on 11/29/2019. The pt reports that he is doing well overall.  The pt reports that he has continued to take Amlodipine and Lisinopril+HCTZ as prescribed and is attempting to limit his sodium intake. His systolic BP has been around 150 in the mornings. Pt has tried to remain active and denies any new concerns.   Lab results today (12/27/19) of CBC w/diff and CMP is as follows: all values are WNL except for Hgb at 12.6, MCV at 73.8, MCH at 22.3, RDW at 15.9, Glucose at 157, BUN at 24, Creatinine at 1.99, Total Protein at 8.3, GFR Est Af Am at 37. 12/27/2019 TSH at 0.993  On review of systems, pt  denies abdominal pain, chest pain, SOB, N/V/D, leg swelling, and any other symptoms.   MEDICAL HISTORY:  Past Medical History:  Diagnosis Date  . Arthritis   . Diabetes mellitus without complication ([place])    type II   . Elevated liver enzymes   . Fatty liver   . Hepatitis C    genotype 1A status post treatment with Viekira and Ribavarin for 24 weeks  . Hepatocellular carcinoma ([place]) 01/30/2014   Path  . History of colon polyps   . Hyperlipidemia   . Hypertension   . Iron deficiency anemia    hx of   . MGUS (monoclonal gammopathy of unknown significance)    Obesity Hepatocellular carcinoma treated with TACE and percutaneous thermal ablation by interventional radiology. Last MRI on 08/06/2015 showed slight decrease in the size of the ablation defect involving the right lobe of the liver area and no findings to suggest residual or recurrent hepatocellular carcinoma. Mild changes of liver cirrhosis. MRI Abd 06/24/2016- no evidence of recurrent HCC   Hepatitis C genotype 1A status post treatment with Viekira and Ribavarin for 24 weeks.  Monoclonal gammopathy of undetermined significance.  SURGICAL HISTORY:  Status post microwave ablation[October 2015] of liver lesion and TACE [July 2015] for [place]. EGD and colonoscopy in 2016   SOCIAL HISTORY: Social History   Socioeconomic History  . Marital status: Married    Spouse name: Not on file  . Number of children: Not on file  . Years of education: Not on file  .  Highest education level: Not on file  Occupational History  . Not on file  Tobacco Use  . Smoking status: Former Smoker    Quit date: 01/30/1994    Years since quitting: 25.9  . Smokeless tobacco: Never Used  . Tobacco comment: stopped 25 yrs ago  Substance and Sexual Activity  . Alcohol use: No    Comment: stopped 30 years ago   . Drug use: No  . Sexual activity: Not Currently  Other Topics Concern  . Not on file  Social History Narrative  . Not on file   Social  Determinants of Health   Financial Resource Strain:   . Difficulty of Paying Living Expenses:   Food Insecurity:   . Worried About Charity fundraiser in the Last Year:   . Arboriculturist in the Last Year:   Transportation Needs:   . Film/video editor (Medical):   [name] Kitchen Lack of Transportation (Non-Medical):   Physical Activity:   . Days of Exercise per Week:   . Minutes of Exercise per Session:   Stress:   . Feeling of Stress :   Social Connections:   . Frequency of Communication with Friends and Family:   . Frequency of Social Gatherings with Friends and Family:   . Attends Religious Services:   . Active Member of Clubs or Organizations:   . Attends Archivist Meetings:   [name] Kitchen Marital Status:   Intimate Partner Violence:   . Fear of Current or Ex-Partner:   . Emotionally Abused:   [name] Kitchen Physically Abused:   . Sexually Abused:   Former smoker and smoked 1 pack per day for about 20 years starting at age 57 quit 38 years ago.  FAMILY HISTORY: No family history on file.  ALLERGIES:  has No Known Allergies.  MEDICATIONS:  Current Outpatient Medications  Medication Sig Dispense Refill  . amLODipine (NORVASC) 10 MG tablet Take 1 tablet (10 mg total) by mouth daily. 30 tablet 3  . aspirin EC 81 MG tablet Take 81 mg by mouth every morning.     . cholecalciferol (VITAMIN D) 1000 UNITS tablet Take 1,000 Units by mouth every morning.     . Insulin Glargine (LANTUS SOLOSTAR) 100 UNIT/ML Solostar Pen Inject 32 Units into the skin daily.    [name] Kitchen lisinopril-hydrochlorothiazide (PRINZIDE,ZESTORETIC) 20-12.5 MG tablet Take 1 tablet by mouth every morning.     . metFORMIN (GLUCOPHAGE) 1000 MG tablet Take 1,000 mg by mouth 2 (two) times daily with a meal.    . Multiple Vitamin (MULTIVITAMIN WITH MINERALS) TABS tablet Take 1 tablet daily by mouth.    [name] Kitchen PAZEO 0.7 % SOLN Place 1 drop into both eyes every morning.   3  . simvastatin (ZOCOR) 40 MG tablet Take 20 mg by mouth every evening.       [name] Kitchen ACCU-CHEK AVIVA PLUS test strip     . Accu-Chek Softclix Lancets lancets     . ALPRAZolam (XANAX) 0.5 MG tablet Take 1 tablet (0.5 mg total) by mouth as directed. Take 2 tablets (1.0 mg total) prior to MR scan.  Make take an additional 0.5 mg po if needed. (Patient not taking: Reported on 07/28/2019) 3 tablet 0  . oxyCODONE (OXY IR/ROXICODONE) 5 MG immediate release tablet Take 1 tablet (5 mg total) by mouth every 6 (six) hours as needed for up to 15 doses for moderate pain or severe pain. (Patient not taking: Reported on 07/28/2019) 15 tablet 0   No current  facility-administered medications for this visit.    REVIEW OF SYSTEMS:   A 10+ POINT REVIEW OF SYSTEMS WAS OBTAINED including neurology, dermatology, psychiatry, cardiac, respiratory, lymph, extremities, GI, GU, Musculoskeletal, constitutional, breasts, reproductive, HEENT.  All pertinent positives are noted in the HPI.  All others are negative.   PHYSICAL EXAMINATION: ECOG FS:1 - Symptomatic but completely ambulatory  Vitals:   12/27/19 0904  BP: (!) 151/72  Pulse: 74  Resp: 20  Temp: 98.5 F (36.9 C)  SpO2: 100%   Wt Readings from Last 3 Encounters:  12/27/19 251 lb 3.2 oz (113.9 kg)  11/29/19 249 lb 6.4 oz (113.1 kg)  11/01/19 255 lb 8 oz (115.9 kg)   Body mass index is 34.07 kg/m.    GENERAL:alert, in no acute distress and comfortable SKIN: no acute rashes, no significant lesions EYES: conjunctiva are pink and non-injected, sclera anicteric OROPHARYNX: MMM, no exudates, no oropharyngeal erythema or ulceration NECK: supple, no JVD LYMPH:  no palpable lymphadenopathy in the cervical, axillary or inguinal regions LUNGS: clear to auscultation b/l with normal respiratory effort HEART: regular rate & rhythm ABDOMEN:  normoactive bowel sounds , non tender, not distended. No palpable hepatosplenomegaly.  Extremity: no pedal edema PSYCH: alert & oriented x 3 with fluent speech NEURO: no focal motor/sensory  deficits  LABORATORY DATA:  I have reviewed the data as listed  . CBC Latest Ref Rng & Units 12/27/2019 12/06/2019 11/29/2019  WBC 4.0 - 10.5 K/uL 5.1 5.3 4.8  Hemoglobin 13.0 - 17.0 g/dL 12.6(L) 12.3(L) 11.8(L)  Hematocrit 39.0 - 52.0 % 41.6 40.6 39.0  Platelets 150 - 400 K/uL 256 261 232   . CBC    Component Value Date/Time   WBC 5.1 12/27/2019 0848   WBC 5.3 11/15/2019 1405   RBC 5.64 12/27/2019 0848   HGB 12.6 (L) 12/27/2019 0848   HGB 11.5 (L) 03/22/2017 1402   HCT 41.6 12/27/2019 0848   HCT 37.0 (L) 03/22/2017 1402   PLT 256 12/27/2019 0848   PLT 262 03/22/2017 1402   MCV 73.8 (L) 12/27/2019 0848   MCV 72.3 (L) 03/22/2017 1402   MCH 22.3 (L) 12/27/2019 0848   MCHC 30.3 12/27/2019 0848   RDW 15.9 (H) 12/27/2019 0848   RDW 15.6 (H) 03/22/2017 1402   LYMPHSABS 1.4 12/27/2019 0848   LYMPHSABS 1.8 03/22/2017 1402   MONOABS 0.4 12/27/2019 0848   MONOABS 0.4 03/22/2017 1402   EOSABS 0.1 12/27/2019 0848   EOSABS 0.1 03/22/2017 1402   BASOSABS 0.1 12/27/2019 0848   BASOSABS 0.0 03/22/2017 1402    . CMP Latest Ref Rng & Units 12/27/2019 12/06/2019 11/29/2019  Glucose 70 - 99 mg/dL 157(H) 137(H) 170(H)  BUN 8 - 23 mg/dL 24(H) 28(H) 32(H)  Creatinine 0.61 - 1.24 mg/dL 1.99(H) 1.68(H) 1.94(H)  Sodium 135 - 145 mmol/L 139 142 142  Potassium 3.5 - 5.1 mmol/L 4.7 4.4 5.3(H)  Chloride 98 - 111 mmol/L 105 107 103  CO2 22 - 32 mmol/L 25 24 29   Calcium 8.9 - 10.3 mg/dL 9.8 9.7 10.0  Total Protein 6.5 - 8.1 g/dL 8.3(H) 8.5(H) 8.1  Total Bilirubin 0.3 - 1.2 mg/dL 0.5 0.6 0.6  Alkaline Phos 38 - 126 U/L 43 46 39  AST 15 - 41 U/L 34 38 40  ALT 0 - 44 U/L 19 23 20     06/21/2019 MR ABDOMEN WWO CONTRAST (Accession [id number])     IFE 1  Comment   Comments: Immunofixation shows IgG monoclonal protein with kappa light chain  specificity.           RADIOGRAPHIC STUDIES:  MRI abd w and wo contrast: 12/17/2016: IMPRESSION: 1. Postprocedural changes of thermal ablation again  noted in the right lobe of the liver, without definitive evidence to suggest local recurrence of disease. No new hepatic lesions are noted. 2. Additional incidental findings, as above.   Electronically Signed   By: Vinnie Langton M.D.   On: 12/17/2016 09:42  ASSESSMENT & PLAN:   74 y.o. male with  #1 Monoclonal gammopathy of undetermined significance.  M protein 0.2 mg/dL immunofixation showing IgG monoclonal protein with kappa light chain specificity. SPEP 02/2016 - 0.3g/dl  No overt anemia.  No overt hypercalcemia. Stable CKD creatinine 1.6  #2 bone lucencies in the right radial mid midshaft and left humeral head. PET/CT did not show any hypermetabolic bone lesions. MRI left shoulder showed no evidence of metastatic disease or multiple myeloma. The x-ray findings appear to be areas of mild demineralization. Patient was seen by orthopedics and no additional recommendations were given.  Plan -Patient's anemia is stable. No significant change in renal function. No new bone pains. No hypercalcemia . No overall no overt evidence of progression of multiple myeloma. -Myeloma labs from today show stable M protein @ 0.2g/dl with no evidence of progression- consistent with MGUS.  #3 Hepatocellular carcinoma treated with TACE and percutaneous thermal ablation by interventional radiology.   Last MRI on 08/06/2015 showed slight decrease in the size of the ablation defect involving the right lobe of the liver area and no findings to suggest residual or recurrent hepatocellular carcinoma. Mild changes of liver cirrhosis.  01/16/2016 MRI Abdomen showed resolution of previously ablated lesion but a new 1.3 cm lesion was noted which was subsequently ablated under CT guidance by interventional radiology on 01/31/2016. Elevated transaminases on 02/01/2016 were likely related to his ablation. These have since resolved.   06/24/2016 MRI Abdomen showed no evidence of recurrent hepatocellular carcinoma  . 12/17/2016 MRI Abdomen shows no evidence of recurrent hepatocellular carcinoma .  10/26/17 MRI Abdomen revealed Motion degraded images. Status post thermal ablation of the segment 8 lesion, without enhancement to suggest residual viable tumor. Prior ablation of a segment 7 lesion, grossly unchanged. No convincing central enhancement  03/14/2019  MRI abdomen w and wo contrast revealed "1. New areas of restricted diffusion, surrounding nodular arterial phase enhancement and delayed washout surrounding the ablation zone defects anteriorly in segments 8 and 7 consistent with local recurrence of hepatocellular carcinoma. 2. The peripheral ablation zone defect laterally in segment 8 is unchanged. 3. No extrahepatic tumor identified."  10/02/2019 MRI Abdomen ([id number]) revealed "1. Progressive multifocal hepatocellular carcinoma, especially anteriorly around the ablated lesion in segments 4B and 8. There are additional new lesions in segments 2 and 3. No evidence of extrahepatic metastatic disease."  #4 Hepatitis C genotype 1A status post treatment with Viekira and Ribavarin for 24 weeks.   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   PLAN:  -Discussed pt labwork today, 12/27/19; all values are WNL except for Hgb at 12.6, MCV at 73.8, MCH at 22.3, RDW at 15.9, Glucose at 157, BUN at 24, Creatinine at 1.99, Total Protein at 8.3, GFR Est Af Am at 37. -Discussed 12/27/2019 TSH is WNL -Discussed 12/06/2019 AFP at 119.0 - response appears to have stagnated -The pt has no prohibitive toxicities from continuing C4 of Atezolizumab + Avastin at this time.  -Will continue treatment until progression or intolerance  -  Recommend pt continue to minimize dietary sodium  -Continue Amlodipine and Lisinopril + HCTZ as prescribed -Continue daily Asprin  -May consult with Dr. Anselm Pancoast for local treatment options if scans reveal localized disease  -Will  get repeat MRI Abd in 2 weeks  -Will see back in 3 weeks with labs    FOLLOW UP: RTC with labs in 3 weeks  Will get repeat MRI Abd in 2 weeks  Plz schedule next cycle of Atezolizumab/Avastin rx with labs and MD visit   The total time spent in the appt was 20 minutes and more than 50% was on counseling and direct patient cares.  All of the patient's questions were answered with apparent satisfaction. The patient knows to call the clinic with any problems, questions or concerns.   [name] MD [place] AAHIVMS [facility] Hematology/Oncology Physician [facility]  (Office):       [phone] (Work cell):  [phone] (Fax):           [phone]   I, Yevette Edwards, am acting as a scribe for Dr. [name].   .I have reviewed the above documentation for accuracy and completeness, and I agree with the above. Brunetta Genera MD

## 2019-12-27 NOTE — Patient Instructions (Signed)
[place] Discharge Instructions for Patients Receiving Chemotherapy  Today you received the following chemotherapy agents Atezolizumab (TECENTRIQ) & Bevacizumab (ZIRABEV).  To help prevent nausea and vomiting after your treatment, we encourage you to take your nausea medication as prescribed.   If you develop nausea and vomiting that is not controlled by your nausea medication, call the clinic.   BELOW ARE SYMPTOMS THAT SHOULD BE REPORTED IMMEDIATELY:  *FEVER GREATER THAN 100.5 F  *CHILLS WITH OR WITHOUT FEVER  NAUSEA AND VOMITING THAT IS NOT CONTROLLED WITH YOUR NAUSEA MEDICATION  *UNUSUAL SHORTNESS OF BREATH  *UNUSUAL BRUISING OR BLEEDING  TENDERNESS IN MOUTH AND THROAT WITH OR WITHOUT PRESENCE OF ULCERS  *URINARY PROBLEMS  *BOWEL PROBLEMS  UNUSUAL RASH Items with * indicate a potential emergency and should be followed up as soon as possible.  Feel free to call the clinic should you have any questions or concerns. The clinic phone number is [phone].  Please show the [place] at check-in to the Emergency Department and triage nurse.

## 2019-12-27 NOTE — Progress Notes (Signed)
Per Dr. Irene Limbo: okay to treat with Scr of 1.99

## 2020-01-05 ENCOUNTER — Telehealth: Payer: Self-pay | Admitting: Hematology

## 2020-01-05 NOTE — Telephone Encounter (Signed)
Called pt per 5/11 sch message - unable to reach pt . Left message with new appt date and time

## 2020-01-17 ENCOUNTER — Other Ambulatory Visit: Payer: Self-pay

## 2020-01-17 ENCOUNTER — Inpatient Hospital Stay: Payer: Medicare HMO

## 2020-01-17 ENCOUNTER — Inpatient Hospital Stay: Payer: Medicare HMO | Admitting: Hematology

## 2020-01-17 ENCOUNTER — Ambulatory Visit: Payer: Medicare HMO

## 2020-01-17 ENCOUNTER — Other Ambulatory Visit: Payer: Medicare HMO

## 2020-01-17 VITALS — BP 158/78

## 2020-01-17 VITALS — BP 164/85 | HR 81 | Temp 97.9°F | Resp 20 | Ht 72.0 in | Wt 247.0 lb

## 2020-01-17 DIAGNOSIS — E669 Obesity, unspecified: Secondary | ICD-10-CM | POA: Diagnosis not present

## 2020-01-17 DIAGNOSIS — Z5112 Encounter for antineoplastic immunotherapy: Secondary | ICD-10-CM

## 2020-01-17 DIAGNOSIS — C220 Liver cell carcinoma: Secondary | ICD-10-CM

## 2020-01-17 DIAGNOSIS — I129 Hypertensive chronic kidney disease with stage 1 through stage 4 chronic kidney disease, or unspecified chronic kidney disease: Secondary | ICD-10-CM | POA: Diagnosis not present

## 2020-01-17 DIAGNOSIS — D509 Iron deficiency anemia, unspecified: Secondary | ICD-10-CM | POA: Diagnosis not present

## 2020-01-17 DIAGNOSIS — E1122 Type 2 diabetes mellitus with diabetic chronic kidney disease: Secondary | ICD-10-CM | POA: Diagnosis not present

## 2020-01-17 DIAGNOSIS — Z7189 Other specified counseling: Secondary | ICD-10-CM

## 2020-01-17 DIAGNOSIS — N189 Chronic kidney disease, unspecified: Secondary | ICD-10-CM | POA: Diagnosis not present

## 2020-01-17 DIAGNOSIS — Z79899 Other long term (current) drug therapy: Secondary | ICD-10-CM | POA: Diagnosis not present

## 2020-01-17 DIAGNOSIS — D472 Monoclonal gammopathy: Secondary | ICD-10-CM | POA: Diagnosis not present

## 2020-01-17 LAB — CBC WITH DIFFERENTIAL/PLATELET
Abs Immature Granulocytes: 0.02 10*3/uL (ref 0.00–0.07)
Basophils Absolute: 0 10*3/uL (ref 0.0–0.1)
Basophils Relative: 1 %
Eosinophils Absolute: 0.1 10*3/uL (ref 0.0–0.5)
Eosinophils Relative: 2 %
HCT: 43.5 % (ref 39.0–52.0)
Hemoglobin: 13.2 g/dL (ref 13.0–17.0)
Immature Granulocytes: 0 %
Lymphocytes Relative: 29 %
Lymphs Abs: 1.5 10*3/uL (ref 0.7–4.0)
MCH: 22.2 pg — ABNORMAL LOW (ref 26.0–34.0)
MCHC: 30.3 g/dL (ref 30.0–36.0)
MCV: 73.2 fL — ABNORMAL LOW (ref 80.0–100.0)
Monocytes Absolute: 0.5 10*3/uL (ref 0.1–1.0)
Monocytes Relative: 9 %
Neutro Abs: 3.2 10*3/uL (ref 1.7–7.7)
Neutrophils Relative %: 59 %
Platelets: 281 10*3/uL (ref 150–400)
RBC: 5.94 MIL/uL — ABNORMAL HIGH (ref 4.22–5.81)
RDW: 15.5 % (ref 11.5–15.5)
WBC: 5.4 10*3/uL (ref 4.0–10.5)
nRBC: 0 % (ref 0.0–0.2)

## 2020-01-17 LAB — CMP (CANCER CENTER ONLY)
ALT: 20 U/L (ref 0–44)
AST: 42 U/L — ABNORMAL HIGH (ref 15–41)
Albumin: 4.3 g/dL (ref 3.5–5.0)
Alkaline Phosphatase: 41 U/L (ref 38–126)
Anion gap: 12 (ref 5–15)
BUN: 33 mg/dL — ABNORMAL HIGH (ref 8–23)
CO2: 26 mmol/L (ref 22–32)
Calcium: 10.4 mg/dL — ABNORMAL HIGH (ref 8.9–10.3)
Chloride: 102 mmol/L (ref 98–111)
Creatinine: 1.92 mg/dL — ABNORMAL HIGH (ref 0.61–1.24)
GFR, Est AFR Am: 39 mL/min — ABNORMAL LOW (ref >60)
GFR, Estimated: 34 mL/min — ABNORMAL LOW (ref >60)
Glucose, Bld: 97 mg/dL (ref 70–99)
Potassium: 4.8 mmol/L (ref 3.5–5.1)
Sodium: 140 mmol/L (ref 135–145)
Total Bilirubin: 0.7 mg/dL (ref 0.3–1.2)
Total Protein: 8.7 g/dL — ABNORMAL HIGH (ref 6.5–8.1)

## 2020-01-17 LAB — TOTAL PROTEIN, URINE DIPSTICK: Protein, ur: NEGATIVE mg/dL

## 2020-01-17 MED ORDER — LISINOPRIL-HYDROCHLOROTHIAZIDE 20-12.5 MG PO TABS: 2.0000 | ORAL_TABLET | Freq: Every day | ORAL | 1 refills | Status: DC

## 2020-01-17 MED ORDER — SODIUM CHLORIDE 0.9 % IV SOLN
15.0000 mg/kg | Freq: Once | INTRAVENOUS | Status: AC
  Administered 2020-01-17: 1700 mg via INTRAVENOUS
  Filled 2020-01-17: qty 64

## 2020-01-17 MED ORDER — SODIUM CHLORIDE 0.9 % IV SOLN
Freq: Once | INTRAVENOUS | Status: AC
  Filled 2020-01-17: qty 250

## 2020-01-17 MED ORDER — SODIUM CHLORIDE 0.9 % IV SOLN
1200.0000 mg | Freq: Once | INTRAVENOUS | Status: AC
  Administered 2020-01-17: 1200 mg via INTRAVENOUS
  Filled 2020-01-17: qty 20

## 2020-01-17 NOTE — Progress Notes (Signed)
HEMATOLOGY/ONCOLOGY CLINIC NOTE  Date of Service: 01/17/2020  Patient Care Team: Jani Gravel, MD as PCP - General (Internal Medicine)  CHIEF COMPLAINTS/PURPOSE OF CONSULTATION:   Aaron [name],Aaron [name]  HISTORY OF PRESENTING ILLNESS:   Aaron [name] is a wonderful 74 y.o. male who has been referred to [place] by Dr Jani Gravel for evaluation and management of monoclonal gammopathy of undetermined significance.  Patient has history of hypertension, diabetes, dyslipidemia, hepatocellular carcinoma (rx with TACE and percutaneous thermal ablation), hepatitis C status post treatment, iron deficiency anemia in 2014 treated with oral iron.  Patient had an SPEP done by his primary care physician on 10/04/2015 that showed an M spike of 0.3 g/dL. It was presumably will be done due to his complaints of fatigue. He has had no significant anemia. No new bone pains. No fevers/chills/drenching night sweats.  No new anemia..  Outside labs show hemoglobin of 12.4 with microcytosis with an MCV of 70.5, normal WBC count of 5.1k.  No evidence of hypercalcemia or significant renal failure on his outside labs.   INTERVAL HISTORY Aaron [name]  is here for his scheduled follow-up for MGUS and HCC. He is here for to C5 of [name] + Avastin. The patient's last visit with [place] was on 12/27/2019. The pt reports that he is doing well overall.  The pt reports no acute new symptoms. MRI Abd still pending. Scheduled for next week.  MEDICAL HISTORY:  Past Medical History:  Diagnosis Date  . Arthritis   . Diabetes mellitus without complication ([place])    type II   . Elevated liver enzymes   . Fatty liver   . Hepatitis C    genotype 1A status post treatment with Viekira and Ribavarin for 24 weeks  . Hepatocellular carcinoma ([place]) 01/30/2014   Path  . History of colon polyps   . Hyperlipidemia   . Hypertension   . Iron deficiency anemia    hx of   . MGUS (monoclonal  gammopathy of unknown significance)    Obesity Hepatocellular carcinoma treated with TACE and percutaneous thermal ablation by interventional radiology. Last MRI on 08/06/2015 showed slight decrease in the size of the ablation defect involving the right lobe of the liver area and no findings to suggest residual or recurrent hepatocellular carcinoma. Mild changes of liver cirrhosis. MRI Abd 06/24/2016- no evidence of recurrent HCC   Hepatitis C genotype 1A status post treatment with Viekira and Ribavarin for 24 weeks.  Monoclonal gammopathy of undetermined significance.  SURGICAL HISTORY:  Status post microwave ablation[October 2015] of liver lesion and TACE [July 2015] for [place]. EGD and colonoscopy in 2016   SOCIAL HISTORY: Social History   Socioeconomic History  . Marital status: Married    Spouse name: Not on file  . Number of children: Not on file  . Years of education: Not on file  . Highest education level: Not on file  Occupational History  . Not on file  Tobacco Use  . Smoking status: Former Smoker    Quit date: 01/30/1994    Years since quitting: 25.9  . Smokeless tobacco: Never Used  . Tobacco comment: stopped 25 yrs ago  Substance and Sexual Activity  . Alcohol use: No    Comment: stopped 30 years ago   . Drug use: No  . Sexual activity: Not Currently  Other Topics Concern  . Not on file  Social History Narrative  . Not on file   Social Determinants of Health  Financial Resource Strain:   . Difficulty of Paying Living Expenses:   Food Insecurity:   . Worried About Charity fundraiser in the Last Year:   . Arboriculturist in the Last Year:   Transportation Needs:   . Film/video editor (Medical):   [name] Kitchen Lack of Transportation (Non-Medical):   Physical Activity:   . Days of Exercise per Week:   . Minutes of Exercise per Session:   Stress:   . Feeling of Stress :   Social Connections:   . Frequency of Communication with Friends and Family:   . Frequency  of Social Gatherings with Friends and Family:   . Attends Religious Services:   . Active Member of Clubs or Organizations:   . Attends Archivist Meetings:   [name] Kitchen Marital Status:   Intimate Partner Violence:   . Fear of Current or Ex-Partner:   . Emotionally Abused:   [name] Kitchen Physically Abused:   . Sexually Abused:   Former smoker and smoked 1 pack per day for about 20 years starting at age 87 quit 25 years ago.  FAMILY HISTORY: No family history on file.  ALLERGIES:  has No Known Allergies.  MEDICATIONS:  Current Outpatient Medications  Medication Sig Dispense Refill  . ACCU-CHEK AVIVA PLUS test strip     . Accu-Chek Softclix Lancets lancets     . ALPRAZolam (XANAX) 0.5 MG tablet Take 1 tablet (0.5 mg total) by mouth as directed. Take 2 tablets (1.0 mg total) prior to MR scan.  Make take an additional 0.5 mg po if needed. (Patient not taking: Reported on 07/28/2019) 3 tablet 0  . amLODipine (NORVASC) 10 MG tablet Take 1 tablet (10 mg total) by mouth daily. 30 tablet 3  . aspirin EC 81 MG tablet Take 81 mg by mouth every morning.     . cholecalciferol (VITAMIN D) 1000 UNITS tablet Take 1,000 Units by mouth every morning.     . Insulin Glargine (LANTUS SOLOSTAR) 100 UNIT/ML Solostar Pen Inject 32 Units into the skin daily.    [name] Kitchen lisinopril-hydrochlorothiazide (PRINZIDE,ZESTORETIC) 20-12.5 MG tablet Take 1 tablet by mouth every morning.     . metFORMIN (GLUCOPHAGE) 1000 MG tablet Take 1,000 mg by mouth 2 (two) times daily with a meal.    . Multiple Vitamin (MULTIVITAMIN WITH MINERALS) TABS tablet Take 1 tablet daily by mouth.    . oxyCODONE (OXY IR/ROXICODONE) 5 MG immediate release tablet Take 1 tablet (5 mg total) by mouth every 6 (six) hours as needed for up to 15 doses for moderate pain or severe pain. (Patient not taking: Reported on 07/28/2019) 15 tablet 0  . PAZEO 0.7 % SOLN Place 1 drop into both eyes every morning.   3  . simvastatin (ZOCOR) 40 MG tablet Take 20 mg by mouth every  evening.      No current facility-administered medications for this visit.    REVIEW OF SYSTEMS:   A 10+ POINT REVIEW OF SYSTEMS WAS OBTAINED including neurology, dermatology, psychiatry, cardiac, respiratory, lymph, extremities, GI, GU, Musculoskeletal, constitutional, breasts, reproductive, HEENT.  All pertinent positives are noted in the HPI.  All others are negative.   PHYSICAL EXAMINATION: ECOG FS:1 - Symptomatic but completely ambulatory  There were no vitals filed for this visit. Wt Readings from Last 3 Encounters:  12/27/19 251 lb 3.2 oz (113.9 kg)  11/29/19 249 lb 6.4 oz (113.1 kg)  11/01/19 255 lb 8 oz (115.9 kg)   Body mass index is 33.5  kg/m.    [name] Kitchen GENERAL:alert, in no acute distress and comfortable SKIN: no acute rashes, no significant lesions EYES: conjunctiva are pink and non-injected, sclera anicteric OROPHARYNX: MMM, no exudates, no oropharyngeal erythema or ulceration NECK: supple, no JVD LYMPH:  no palpable lymphadenopathy in the cervical, axillary or inguinal regions LUNGS: clear to auscultation b/l with normal respiratory effort HEART: regular rate & rhythm ABDOMEN:  normoactive bowel sounds , non tender, not distended. Extremity: no pedal edema PSYCH: alert & oriented x 3 with fluent speech NEURO: no focal motor/sensory deficits   LABORATORY DATA:  I have reviewed the data as listed  . CBC Latest Ref Rng & Units 01/17/2020 12/27/2019 12/06/2019  WBC 4.0 - 10.5 K/uL 5.4 5.1 5.3  Hemoglobin 13.0 - 17.0 g/dL 13.2 12.6(L) 12.3(L)  Hematocrit 39.0 - 52.0 % 43.5 41.6 40.6  Platelets 150 - 400 K/uL 281 256 261   . CBC    Component Value Date/Time   WBC 5.4 01/17/2020 1419   RBC 5.94 (H) 01/17/2020 1419   HGB 13.2 01/17/2020 1419   HGB 12.6 (L) 12/27/2019 0848   HGB 11.5 (L) 03/22/2017 1402   HCT 43.5 01/17/2020 1419   HCT 37.0 (L) 03/22/2017 1402   PLT 281 01/17/2020 1419   PLT 256 12/27/2019 0848   PLT 262 03/22/2017 1402   MCV 73.2 (L) 01/17/2020  1419   MCV 72.3 (L) 03/22/2017 1402   MCH 22.2 (L) 01/17/2020 1419   MCHC 30.3 01/17/2020 1419   RDW 15.5 01/17/2020 1419   RDW 15.6 (H) 03/22/2017 1402   LYMPHSABS 1.5 01/17/2020 1419   LYMPHSABS 1.8 03/22/2017 1402   MONOABS 0.5 01/17/2020 1419   MONOABS 0.4 03/22/2017 1402   EOSABS 0.1 01/17/2020 1419   EOSABS 0.1 03/22/2017 1402   BASOSABS 0.0 01/17/2020 1419   BASOSABS 0.0 03/22/2017 1402    . CMP Latest Ref Rng & Units 01/17/2020 12/27/2019 12/06/2019  Glucose 70 - 99 mg/dL 97 157(H) 137(H)  BUN 8 - 23 mg/dL 33(H) 24(H) 28(H)  Creatinine 0.61 - 1.24 mg/dL 1.92(H) 1.99(H) 1.68(H)  Sodium 135 - 145 mmol/L 140 139 142  Potassium 3.5 - 5.1 mmol/L 4.8 4.7 4.4  Chloride 98 - 111 mmol/L 102 105 107  CO2 22 - 32 mmol/L _0 Calcium 8.9 - 10.3 mg/dL 10.4(H) 9.8 9.7  Total Protein 6.5 - 8.1 g/dL 8.7(H) 8.3(H) 8.5(H)  Total Bilirubin 0.3 - 1.2 mg/dL 0.7 0.5 0.6  Alkaline Phos 38 - 126 U/L 41 43 46  AST 15 - 41 U/L 42(H) 34 38  ALT 0 - 44 U/L _1 06/21/2019 MR ABDOMEN WWO CONTRAST (Accession [id number])     IFE 1  Comment   Comments: Immunofixation shows IgG monoclonal protein with kappa light chain  specificity.           RADIOGRAPHIC STUDIES:  MRI abd w and wo contrast: 12/17/2016: IMPRESSION: 1. Postprocedural changes of thermal ablation again noted in the right lobe of the liver, without definitive evidence to suggest local recurrence of disease. No new hepatic lesions are noted. 2. Additional incidental findings, as above.   Electronically Signed   By: Vinnie Langton M.D.   On: 12/17/2016 09:42  ASSESSMENT & PLAN:   74 y.o. male with  #1 Monoclonal gammopathy of undetermined significance.  M protein 0.2 mg/dL immunofixation showing IgG monoclonal protein with kappa light chain specificity. SPEP 02/2016 - 0.3g/dl  No overt anemia.  No overt hypercalcemia. Stable CKD  creatinine 1.6  #2 bone lucencies in the right radial mid midshaft and  left humeral head. PET/CT did not show any hypermetabolic bone lesions. MRI left shoulder showed no evidence of metastatic disease or multiple myeloma. The x-ray findings appear to be areas of mild demineralization. Patient was seen by orthopedics and no additional recommendations were given.  Plan -Patient's anemia is stable. No significant change in renal function. No new bone pains. No hypercalcemia . No overall no overt evidence of [name] of multiple myeloma. -Myeloma labs from today show stable M protein @ 0.2g/dl with no evidence of [name]- consistent with MGUS.  #3 Hepatocellular carcinoma treated with TACE and percutaneous thermal ablation by interventional radiology.   Last MRI on 08/06/2015 showed slight decrease in the size of the ablation defect involving the right lobe of the liver area and no findings to suggest residual or recurrent hepatocellular carcinoma. Mild changes of liver cirrhosis.  01/16/2016 MRI Abdomen showed resolution of previously ablated lesion but a new 1.3 cm lesion was noted which was subsequently ablated under CT guidance by interventional radiology on 01/31/2016. Elevated transaminases on 02/01/2016 were likely related to his ablation. These have since resolved.   06/24/2016 MRI Abdomen showed no evidence of recurrent hepatocellular carcinoma . 12/17/2016 MRI Abdomen shows no evidence of recurrent hepatocellular carcinoma .  10/26/17 MRI Abdomen revealed Motion degraded images. Status post thermal ablation of the segment 8 lesion, without enhancement to suggest residual viable tumor. Prior ablation of a segment 7 lesion, grossly unchanged. No convincing central enhancement  03/14/2019  MRI abdomen w and wo contrast revealed "1. New areas of restricted diffusion, surrounding nodular arterial phase enhancement and delayed washout surrounding the ablation zone defects anteriorly in segments 8 and 7 consistent with local recurrence of hepatocellular carcinoma. 2.  The peripheral ablation zone defect laterally in segment 8 is unchanged. 3. No extrahepatic tumor identified."  10/02/2019 MRI Abdomen ([id number]) revealed "1. Progressive multifocal hepatocellular carcinoma, especially anteriorly around the ablated lesion in segments 4B and 8. There are additional new lesions in segments 2 and 3. No evidence of extrahepatic metastatic disease."  #4 Hepatitis C genotype 1A status post treatment with Viekira and Ribavarin for 24 weeks.   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   PLAN:  - labs from today discussed -The pt has no prohibitive toxicities from continuing C5 of [name] + Avastin at this time. -Will continue treatment until [name] or intolerance  -Continue Amlodipine and Lisinopril + HCTZ as prescribed -Continue daily Aspirin  -May consult with Dr. Anselm Pancoast for local treatment options if scans reveal localized disease   FOLLOW UP: MRI abd as scheduled on 6/4 Phone visit with Dr Irene Limbo on 6/7 or 6/8   The total time spent in the appt was 20 minutes and more than 50% was on counseling and direct patient cares.  All of the patient's questions were answered with apparent satisfaction. The patient knows to call the clinic with any problems, questions or concerns.   [name] MD [place] AAHIVMS [facility] Hematology/Oncology Physician [facility]  (Office):       [phone] (Work cell):  [phone] (Fax):           [phone]   I, Yevette Edwards, am acting as a scribe for Dr. [name].   .I have reviewed the above documentation for accuracy and completeness, and I agree with the above. Brunetta Genera MD

## 2020-01-17 NOTE — Patient Instructions (Signed)
[facility] Discharge Instructions for Patients Receiving Chemotherapy  Today you received the following chemotherapy agents: Avastin and Zirabev  To help prevent nausea and vomiting after your treatment, we encourage you to take your nausea medication as prescribed.    If you develop nausea and vomiting that is not controlled by your nausea medication, call the clinic.   BELOW ARE SYMPTOMS THAT SHOULD BE REPORTED IMMEDIATELY:  *FEVER GREATER THAN 100.5 F  *CHILLS WITH OR WITHOUT FEVER  NAUSEA AND VOMITING THAT IS NOT CONTROLLED WITH YOUR NAUSEA MEDICATION  *UNUSUAL SHORTNESS OF BREATH  *UNUSUAL BRUISING OR BLEEDING  TENDERNESS IN MOUTH AND THROAT WITH OR WITHOUT PRESENCE OF ULCERS  *URINARY PROBLEMS  *BOWEL PROBLEMS  UNUSUAL RASH Items with * indicate a potential emergency and should be followed up as soon as possible.  Feel free to call the clinic should you have any questions or concerns. The clinic phone number is [phone].  Please show the [place] at check-in to the Emergency Department and triage nurse.

## 2020-01-17 NOTE — Progress Notes (Signed)
Per Dr. Kale, ok to treat with elevated creatinine.  

## 2020-01-18 LAB — AFP TUMOR MARKER: AFP, Serum, Tumor Marker: 164 ng/mL — ABNORMAL HIGH (ref 0.0–8.3)

## 2020-01-19 ENCOUNTER — Telehealth: Payer: Self-pay | Admitting: Hematology

## 2020-01-19 NOTE — Telephone Encounter (Signed)
Scheduled per 05/26 los, patient has been called and notified. 

## 2020-01-26 ENCOUNTER — Ambulatory Visit (HOSPITAL_COMMUNITY)
Admission: RE | Admit: 2020-01-26 | Discharge: 2020-01-26 | Disposition: A | Discharge status: Home / Self Care | Payer: Medicare HMO | Source: Ambulatory Visit | Attending: Hematology | Admitting: Hematology

## 2020-01-26 ENCOUNTER — Other Ambulatory Visit: Payer: Self-pay

## 2020-01-26 DIAGNOSIS — Z5112 Encounter for antineoplastic immunotherapy: Secondary | ICD-10-CM | POA: Diagnosis not present

## 2020-01-26 DIAGNOSIS — C220 Liver cell carcinoma: Secondary | ICD-10-CM | POA: Insufficient documentation

## 2020-01-26 MED ORDER — GADOBUTROL 1 MMOL/ML IV SOLN
10.0000 mL | Freq: Once | INTRAVENOUS | Status: AC | PRN
  Administered 2020-01-26: 10 mL via INTRAVENOUS

## 2020-01-30 ENCOUNTER — Inpatient Hospital Stay: Payer: Medicare HMO | Attending: Hematology | Admitting: Hematology

## 2020-01-30 DIAGNOSIS — C220 Liver cell carcinoma: Secondary | ICD-10-CM

## 2020-01-30 NOTE — Progress Notes (Signed)
HEMATOLOGY/ONCOLOGY CLINIC NOTE  Date of Service: 01/31/2020  Patient Care Team: Jani Gravel, MD as PCP - General (Internal Medicine)  CHIEF COMPLAINTS/PURPOSE OF CONSULTATION:   [facility]- progression,toxicity check with avastin + atezolizumab  HISTORY OF PRESENTING ILLNESS:   Aaron [name] is a wonderful 74 y.o. male who has been referred to [place] by Dr Jani Gravel for evaluation and management of monoclonal gammopathy of undetermined significance.  Patient has history of hypertension, diabetes, dyslipidemia, hepatocellular carcinoma (rx with TACE and percutaneous thermal ablation), hepatitis C status post treatment, iron deficiency anemia in 2014 treated with oral iron.  Patient had an SPEP done by his primary care physician on 10/04/2015 that showed an M spike of 0.3 g/dL. It was presumably will be done due to his complaints of fatigue. He has had no significant anemia. No new bone pains. No fevers/chills/drenching night sweats.  No new anemia..  Outside labs show hemoglobin of 12.4 with microcytosis with an MCV of 70.5, normal WBC count of 5.1k.  No evidence of hypercalcemia or significant renal failure on his outside labs.   INTERVAL HISTORY: I connected with  Aaron [name] on 01/31/20 by telephone and verified that I am speaking with the correct person using two identifiers.   I discussed the limitations of evaluation and management by telemedicine. The patient expressed understanding and agreed to proceed.  Other persons participating in the visit and their role in the encounter:       -Yevette Edwards, Medical Scribe  Patient's location: Home Provider's location: [facility] [place]  is here for his scheduled follow-up for MGUS and HCC. The patient's last visit with [place] was on 01/17/2020. The pt reports that he is doing well overall.  The pt reports that he has been feeling well and is not experiencing any new symptoms.  Of note since the patient's last visit,  pt has had MRI Abdomen ([id number]) completed on 01/26/2020 with results revealing "1. Interval enlargement of multiple hepatic masses as detailed above, consistent with progression of multifocal hepatocellular carcinoma. There is evidence of prior ablation in hepatic segments VII and VIII. 2. No evidence of metastatic disease in the abdomen."  On review of systems, pt denies abdominal pain and any other symptoms.    MEDICAL HISTORY:  Past Medical History:  Diagnosis Date  . Arthritis   . Diabetes mellitus without complication ([place])    type II   . Elevated liver enzymes   . Fatty liver   . Hepatitis C    genotype 1A status post treatment with Viekira and Ribavarin for 24 weeks  . Hepatocellular carcinoma ([place]) 01/30/2014   Path  . History of colon polyps   . Hyperlipidemia   . Hypertension   . Iron deficiency anemia    hx of   . MGUS (monoclonal gammopathy of unknown significance)    Obesity Hepatocellular carcinoma treated with TACE and percutaneous thermal ablation by interventional radiology. Last MRI on 08/06/2015 showed slight decrease in the size of the ablation defect involving the right lobe of the liver area and no findings to suggest residual or recurrent hepatocellular carcinoma. Mild changes of liver cirrhosis. MRI Abd 06/24/2016- no evidence of recurrent HCC   Hepatitis C genotype 1A status post treatment with Viekira and Ribavarin for 24 weeks.  Monoclonal gammopathy of undetermined significance.  SURGICAL HISTORY:  Status post microwave ablation[October 2015] of liver lesion and TACE [July 2015] for [place]. EGD and colonoscopy in 2016   SOCIAL HISTORY:  Social History   Socioeconomic History  . Marital status: Married    Spouse name: Not on file  . Number of children: Not on file  . Years of education: Not on file  . Highest education level: Not on file  Occupational History  . Not on file  Tobacco Use  . Smoking status: Former Smoker    Quit date: 01/30/1994      Years since quitting: 26.0  . Smokeless tobacco: Never Used  . Tobacco comment: stopped 25 yrs ago  Substance and Sexual Activity  . Alcohol use: No    Comment: stopped 30 years ago   . Drug use: No  . Sexual activity: Not Currently  Other Topics Concern  . Not on file  Social History Narrative  . Not on file   Social Determinants of Health   Financial Resource Strain:   . Difficulty of Paying Living Expenses:   Food Insecurity:   . Worried About Running Out of Food in the Last Year:   . Ran Out of Food in the Last Year:   Transportation Needs:   . Lack of Transportation (Medical):   . Lack of Transportation (Non-Medical):   Physical Activity:   . Days of Exercise per Week:   . Minutes of Exercise per Session:   Stress:   . Feeling of Stress :   Social Connections:   . Frequency of Communication with Friends and Family:   . Frequency of Social Gatherings with Friends and Family:   . Attends Religious Services:   . Active Member of Clubs or Organizations:   . Attends Club or Organization Meetings:   . Marital Status:   Intimate Partner Violence:   . Fear of Current or Ex-Partner:   . Emotionally Abused:   . Physically Abused:   . Sexually Abused:   Former smoker and smoked 1 pack per day for about 20 years starting at age 17 quit 30 years ago.  FAMILY HISTORY: No family history on file.  ALLERGIES:  has No Known Allergies.  MEDICATIONS:  Current Outpatient Medications  Medication Sig Dispense Refill  . ACCU-CHEK AVIVA PLUS test strip     . Accu-Chek Softclix Lancets lancets     . ALPRAZolam (XANAX) 0.5 MG tablet Take 1 tablet (0.5 mg total) by mouth as directed. Take 2 tablets (1.0 mg total) prior to MR scan.  Make take an additional 0.5 mg po if needed. (Patient not taking: Reported on 07/28/2019) 3 tablet 0  . amLODipine (NORVASC) 10 MG tablet Take 1 tablet (10 mg total) by mouth daily. 30 tablet 3  . aspirin EC 81 MG tablet Take 81 mg by mouth every morning.      . cholecalciferol (VITAMIN D) 1000 UNITS tablet Take 1,000 Units by mouth every morning.     . Insulin Glargine (LANTUS SOLOSTAR) 100 UNIT/ML Solostar Pen Inject 32 Units into the skin daily.    . lisinopril-hydrochlorothiazide (ZESTORETIC) 20-12.5 MG tablet Take 2 tablets by mouth daily with breakfast. 60 tablet 1  . metFORMIN (GLUCOPHAGE) 1000 MG tablet Take 1,000 mg by mouth 2 (two) times daily with a meal.    . Multiple Vitamin (MULTIVITAMIN WITH MINERALS) TABS tablet Take 1 tablet daily by mouth.    . oxyCODONE (OXY IR/ROXICODONE) 5 MG immediate release tablet Take 1 tablet (5 mg total) by mouth every 6 (six) hours as needed for up to 15 doses for moderate pain or severe pain. (Patient not taking: Reported on 07/28/2019) 15 tablet 0  .   PAZEO 0.7 % SOLN Place 1 drop into both eyes every morning.   3  . simvastatin (ZOCOR) 40 MG tablet Take 20 mg by mouth every evening.      No current facility-administered medications for this visit.    REVIEW OF SYSTEMS:   A 10+ POINT REVIEW OF SYSTEMS WAS OBTAINED including neurology, dermatology, psychiatry, cardiac, respiratory, lymph, extremities, GI, GU, Musculoskeletal, constitutional, breasts, reproductive, HEENT.  All pertinent positives are noted in the HPI.  All others are negative.   PHYSICAL EXAMINATION: ECOG FS:1 - Symptomatic but completely ambulatory  There were no vitals filed for this visit. Wt Readings from Last 3 Encounters:  01/17/20 247 lb (112 kg)  12/27/19 251 lb 3.2 oz (113.9 kg)  11/29/19 249 lb 6.4 oz (113.1 kg)   There is no height or weight on file to calculate BMI.    Telehealth visit  LABORATORY DATA:  I have reviewed the data as listed  . CBC Latest Ref Rng & Units 01/17/2020 12/27/2019 12/06/2019  WBC 4.0 - 10.5 K/uL 5.4 5.1 5.3  Hemoglobin 13.0 - 17.0 g/dL 13.2 12.6(L) 12.3(L)  Hematocrit 39.0 - 52.0 % 43.5 41.6 40.6  Platelets 150 - 400 K/uL 281 256 261   . CBC    Component Value Date/Time   WBC 5.4  01/17/2020 1419   RBC 5.94 (H) 01/17/2020 1419   HGB 13.2 01/17/2020 1419   HGB 12.6 (L) 12/27/2019 0848   HGB 11.5 (L) 03/22/2017 1402   HCT 43.5 01/17/2020 1419   HCT 37.0 (L) 03/22/2017 1402   PLT 281 01/17/2020 1419   PLT 256 12/27/2019 0848   PLT 262 03/22/2017 1402   MCV 73.2 (L) 01/17/2020 1419   MCV 72.3 (L) 03/22/2017 1402   MCH 22.2 (L) 01/17/2020 1419   MCHC 30.3 01/17/2020 1419   RDW 15.5 01/17/2020 1419   RDW 15.6 (H) 03/22/2017 1402   LYMPHSABS 1.5 01/17/2020 1419   LYMPHSABS 1.8 03/22/2017 1402   MONOABS 0.5 01/17/2020 1419   MONOABS 0.4 03/22/2017 1402   EOSABS 0.1 01/17/2020 1419   EOSABS 0.1 03/22/2017 1402   BASOSABS 0.0 01/17/2020 1419   BASOSABS 0.0 03/22/2017 1402    . CMP Latest Ref Rng & Units 01/17/2020 12/27/2019 12/06/2019  Glucose 70 - 99 mg/dL 97 157(H) 137(H)  BUN 8 - 23 mg/dL 33(H) 24(H) 28(H)  Creatinine 0.61 - 1.24 mg/dL 1.92(H) 1.99(H) 1.68(H)  Sodium 135 - 145 mmol/L 140 139 142  Potassium 3.5 - 5.1 mmol/L 4.8 4.7 4.4  Chloride 98 - 111 mmol/L 102 105 107  CO2 22 - 32 mmol/L 26 25 24  Calcium 8.9 - 10.3 mg/dL 10.4(H) 9.8 9.7  Total Protein 6.5 - 8.1 g/dL 8.7(H) 8.3(H) 8.5(H)  Total Bilirubin 0.3 - 1.2 mg/dL 0.7 0.5 0.6  Alkaline Phos 38 - 126 U/L 41 43 46  AST 15 - 41 U/L 42(H) 34 38  ALT 0 - 44 U/L 20 19 23    06/21/2019 MR ABDOMEN WWO CONTRAST (Accession [id number])     IFE 1  Comment   Comments: Immunofixation shows IgG monoclonal protein with kappa light chain  specificity.           RADIOGRAPHIC STUDIES:  MRI abd w and wo contrast: 12/17/2016: IMPRESSION: 1. Postprocedural changes of thermal ablation again noted in the right lobe of the liver, without definitive evidence to suggest local recurrence of disease. No new hepatic lesions are noted. 2. Additional incidental findings, as above.   Electronically Signed     By: Vinnie Langton M.D.   On: 12/17/2016 09:42    MRI abd w and wo contrast  01/26/2020 IMPRESSION: 1. Interval enlargement of multiple hepatic masses as detailed above, consistent with progression of multifocal hepatocellular carcinoma. There is evidence of prior ablation in hepatic segments VII and VIII. 2. No evidence of metastatic disease in the abdomen.   Electronically Signed   By: Eddie Candle M.D.   On: 01/27/2020 15:51   ASSESSMENT & PLAN:   74 y.o. male with  #1 Monoclonal gammopathy of undetermined significance.  M protein 0.2 mg/dL immunofixation showing IgG monoclonal protein with kappa light chain specificity. SPEP 02/2016 - 0.3g/dl  No overt anemia.  No overt hypercalcemia. Stable CKD creatinine 1.6  #2 bone lucencies in the right radial mid midshaft and left humeral head. PET/CT did not show any hypermetabolic bone lesions. MRI left shoulder showed no evidence of metastatic disease or multiple myeloma. The x-ray findings appear to be areas of mild demineralization. Patient was seen by orthopedics and no additional recommendations were given.  Plan -Patient's anemia is stable. No significant change in renal function. No new bone pains. No hypercalcemia . No overall no overt evidence of progression of multiple myeloma. -Myeloma labs from today show stable M protein @ 0.2g/dl with no evidence of progression- consistent with MGUS.  #3 Hepatocellular carcinoma treated with TACE and percutaneous thermal ablation by interventional radiology.   Last MRI on 08/06/2015 showed slight decrease in the size of the ablation defect involving the right lobe of the liver area and no findings to suggest residual or recurrent hepatocellular carcinoma. Mild changes of liver cirrhosis.  01/16/2016 MRI Abdomen showed resolution of previously ablated lesion but a new 1.3 cm lesion was noted which was subsequently ablated under CT guidance by interventional radiology on 01/31/2016. Elevated transaminases on 02/01/2016 were likely related to his ablation. These have  since resolved.   06/24/2016 MRI Abdomen showed no evidence of recurrent hepatocellular carcinoma . 12/17/2016 MRI Abdomen shows no evidence of recurrent hepatocellular carcinoma .  10/26/17 MRI Abdomen revealed Motion degraded images. Status post thermal ablation of the segment 8 lesion, without enhancement to suggest residual viable tumor. Prior ablation of a segment 7 lesion, grossly unchanged. No convincing central enhancement  03/14/2019  MRI abdomen w and wo contrast revealed "1. New areas of restricted diffusion, surrounding nodular arterial phase enhancement and delayed washout surrounding the ablation zone defects anteriorly in segments 8 and 7 consistent with local recurrence of hepatocellular carcinoma. 2. The peripheral ablation zone defect laterally in segment 8 is unchanged. 3. No extrahepatic tumor identified."  10/02/2019 MRI Abdomen ([id number]) revealed "1. Progressive multifocal hepatocellular carcinoma, especially anteriorly around the ablated lesion in segments 4B and 8. There are additional new lesions in segments 2 and 3. No evidence of extrahepatic metastatic disease."  #4 Hepatitis C genotype 1A status post treatment with Viekira and Ribavarin for 24 weeks.   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   PLAN:  -No new abdominal symptoms -Discussed 01/26/2020 MRI Abdomen ([id number]) which revealed "1. Interval enlargement of multiple hepatic masses as detailed above, consistent with progression of multifocal hepatocellular carcinoma. There is evidence of prior ablation in hepatic segments VII and VIII. 2. No evidence of metastatic disease in the abdomen." -Will discuss MRI findings with Dr. Anselm Pancoast to see if any arterially-directed therapies or radiation therapies would be appropriate to reduce burden of disease  -Advised pt that although the current  chemotherapy regimen is not successfully stopping the  progression of disease, it may be slowing it down. -Advised again that all treatments will be palliative in nature.  -Continue Amlodipine and Lisinopril + HCTZ as prescribed -Continue daily Aspirin     The total time spent in the appt was 20 minutes and more than 50% was on counseling and direct patient cares.  All of the patient's questions were answered with apparent satisfaction. The patient knows to call the clinic with any problems, questions or concerns.   Gautam Kale MD MS AAHIVMS SCH CTH Hematology/Oncology Physician [facility]  (Office):       [phone] (Work cell):  [phone] (Fax):           [phone]   I, Jazzmine Knight, am acting as a scribe for Dr. Gautam Kale.   .I have reviewed the above documentation for accuracy and completeness, and I agree with the above. .Gautam Kishore Kale MD   ADDENDUM  Case discussed with Dr Henn -- he recommended Y90 beads embolization and shall help set this up. We shall f/u with patient in 3 months with labs to consider Lenvatinib for systemic rx of HCC s/p Y90 bead embolization.  .Gautam Kishore Kale MD               

## 2020-01-31 ENCOUNTER — Other Ambulatory Visit: Payer: Self-pay | Admitting: Interventional Radiology

## 2020-02-05 ENCOUNTER — Other Ambulatory Visit (HOSPITAL_COMMUNITY): Payer: Self-pay | Admitting: Diagnostic Radiology

## 2020-02-05 DIAGNOSIS — C220 Liver cell carcinoma: Secondary | ICD-10-CM

## 2020-02-06 ENCOUNTER — Telehealth: Payer: Self-pay | Admitting: Hematology

## 2020-02-06 NOTE — Telephone Encounter (Signed)
Scheduled per 6/14 sch message. Unable to reach pt. Left voicemail- appts added 9/15.

## 2020-02-10 ENCOUNTER — Other Ambulatory Visit: Payer: Self-pay | Admitting: Hematology

## 2020-02-13 ENCOUNTER — Other Ambulatory Visit: Payer: Self-pay | Admitting: Radiology

## 2020-02-14 ENCOUNTER — Telehealth: Payer: Self-pay | Admitting: *Deleted

## 2020-02-14 ENCOUNTER — Other Ambulatory Visit: Payer: Self-pay | Admitting: Physician Assistant

## 2020-02-14 ENCOUNTER — Encounter (HOSPITAL_COMMUNITY): Payer: Self-pay

## 2020-02-14 ENCOUNTER — Ambulatory Visit (HOSPITAL_COMMUNITY)
Admission: RE | Admit: 2020-02-14 | Discharge: 2020-02-14 | Disposition: A | Discharge status: Home / Self Care | Payer: Medicare HMO | Source: Ambulatory Visit | Attending: Diagnostic Radiology | Admitting: Diagnostic Radiology

## 2020-02-14 ENCOUNTER — Other Ambulatory Visit: Payer: Self-pay

## 2020-02-14 ENCOUNTER — Encounter (HOSPITAL_COMMUNITY)
Admission: RE | Admit: 2020-02-14 | Discharge: 2020-02-14 | Disposition: A | Discharge status: Home / Self Care | Payer: Medicare HMO | Source: Ambulatory Visit | Attending: Diagnostic Radiology | Admitting: Diagnostic Radiology

## 2020-02-14 ENCOUNTER — Other Ambulatory Visit: Payer: Self-pay | Admitting: Hematology

## 2020-02-14 ENCOUNTER — Other Ambulatory Visit (HOSPITAL_COMMUNITY): Payer: Self-pay | Admitting: Diagnostic Radiology

## 2020-02-14 ENCOUNTER — Ambulatory Visit (HOSPITAL_COMMUNITY)
Admission: RE | Admit: 2020-02-14 | Discharge: 2020-02-14 | Disposition: A | Discharge status: Home / Self Care | Payer: Medicare HMO | Source: Ambulatory Visit | Attending: Hematology | Admitting: Hematology

## 2020-02-14 DIAGNOSIS — N189 Chronic kidney disease, unspecified: Secondary | ICD-10-CM

## 2020-02-14 DIAGNOSIS — E119 Type 2 diabetes mellitus without complications: Secondary | ICD-10-CM | POA: Diagnosis not present

## 2020-02-14 DIAGNOSIS — D472 Monoclonal gammopathy: Secondary | ICD-10-CM

## 2020-02-14 DIAGNOSIS — E785 Hyperlipidemia, unspecified: Secondary | ICD-10-CM | POA: Insufficient documentation

## 2020-02-14 DIAGNOSIS — C220 Liver cell carcinoma: Secondary | ICD-10-CM

## 2020-02-14 DIAGNOSIS — Z79899 Other long term (current) drug therapy: Secondary | ICD-10-CM | POA: Insufficient documentation

## 2020-02-14 DIAGNOSIS — M1990 Unspecified osteoarthritis, unspecified site: Secondary | ICD-10-CM | POA: Diagnosis not present

## 2020-02-14 DIAGNOSIS — B1920 Unspecified viral hepatitis C without hepatic coma: Secondary | ICD-10-CM | POA: Insufficient documentation

## 2020-02-14 DIAGNOSIS — I10 Essential (primary) hypertension: Secondary | ICD-10-CM | POA: Diagnosis not present

## 2020-02-14 DIAGNOSIS — Z510 Encounter for antineoplastic radiation therapy: Secondary | ICD-10-CM | POA: Diagnosis not present

## 2020-02-14 DIAGNOSIS — Z7982 Long term (current) use of aspirin: Secondary | ICD-10-CM | POA: Insufficient documentation

## 2020-02-14 DIAGNOSIS — D509 Iron deficiency anemia, unspecified: Secondary | ICD-10-CM | POA: Diagnosis not present

## 2020-02-14 DIAGNOSIS — K760 Fatty (change of) liver, not elsewhere classified: Secondary | ICD-10-CM | POA: Diagnosis not present

## 2020-02-14 DIAGNOSIS — Z87891 Personal history of nicotine dependence: Secondary | ICD-10-CM | POA: Insufficient documentation

## 2020-02-14 DIAGNOSIS — Z794 Long term (current) use of insulin: Secondary | ICD-10-CM | POA: Insufficient documentation

## 2020-02-14 HISTORY — PX: IR ANGIOGRAM SELECTIVE EACH ADDITIONAL VESSEL: IMG667

## 2020-02-14 HISTORY — PX: IR US GUIDE VASC ACCESS RIGHT: IMG2390

## 2020-02-14 HISTORY — PX: IR ANGIOGRAM VISCERAL SELECTIVE: IMG657

## 2020-02-14 HISTORY — PX: IR EMBO ARTERIAL NOT HEMORR HEMANG INC GUIDE ROADMAPPING: IMG5448

## 2020-02-14 LAB — CBC WITH DIFFERENTIAL/PLATELET
Abs Immature Granulocytes: 0.04 10*3/uL (ref 0.00–0.07)
Basophils Absolute: 0 10*3/uL (ref 0.0–0.1)
Basophils Relative: 1 %
Eosinophils Absolute: 0.1 10*3/uL (ref 0.0–0.5)
Eosinophils Relative: 2 %
HCT: 46.1 % (ref 39.0–52.0)
Hemoglobin: 13.8 g/dL (ref 13.0–17.0)
Immature Granulocytes: 1 %
Lymphocytes Relative: 21 %
Lymphs Abs: 1.2 10*3/uL (ref 0.7–4.0)
MCH: 22.4 pg — ABNORMAL LOW (ref 26.0–34.0)
MCHC: 29.9 g/dL — ABNORMAL LOW (ref 30.0–36.0)
MCV: 74.7 fL — ABNORMAL LOW (ref 80.0–100.0)
Monocytes Absolute: 0.6 10*3/uL (ref 0.1–1.0)
Monocytes Relative: 10 %
Neutro Abs: 3.5 10*3/uL (ref 1.7–7.7)
Neutrophils Relative %: 65 %
Platelets: 244 10*3/uL (ref 150–400)
RBC: 6.17 MIL/uL — ABNORMAL HIGH (ref 4.22–5.81)
RDW: 16.8 % — ABNORMAL HIGH (ref 11.5–15.5)
WBC: 5.4 10*3/uL (ref 4.0–10.5)
nRBC: 0 % (ref 0.0–0.2)

## 2020-02-14 LAB — COMPREHENSIVE METABOLIC PANEL
ALT: 21 U/L (ref 0–44)
AST: 47 U/L — ABNORMAL HIGH (ref 15–41)
Albumin: 4.5 g/dL (ref 3.5–5.0)
Alkaline Phosphatase: 39 U/L (ref 38–126)
Anion gap: 13 (ref 5–15)
BUN: 33 mg/dL — ABNORMAL HIGH (ref 8–23)
CO2: 22 mmol/L (ref 22–32)
Calcium: 9 mg/dL (ref 8.9–10.3)
Chloride: 103 mmol/L (ref 98–111)
Creatinine, Ser: 1.78 mg/dL — ABNORMAL HIGH (ref 0.61–1.24)
GFR calc Af Amer: 43 mL/min — ABNORMAL LOW (ref >60)
GFR calc non Af Amer: 37 mL/min — ABNORMAL LOW (ref >60)
Glucose, Bld: 166 mg/dL — ABNORMAL HIGH (ref 70–99)
Potassium: 5.4 mmol/L — ABNORMAL HIGH (ref 3.5–5.1)
Sodium: 138 mmol/L (ref 135–145)
Total Bilirubin: 0.6 mg/dL (ref 0.3–1.2)
Total Protein: 8.3 g/dL — ABNORMAL HIGH (ref 6.5–8.1)

## 2020-02-14 LAB — GLUCOSE, CAPILLARY: Glucose-Capillary: 137 mg/dL — ABNORMAL HIGH (ref 70–99)

## 2020-02-14 LAB — PROTIME-INR
INR: 0.9 (ref 0.8–1.2)
Prothrombin Time: 12.1 seconds (ref 11.4–15.2)

## 2020-02-14 LAB — APTT: aPTT: 30 seconds (ref 24–36)

## 2020-02-14 MED ORDER — LIDOCAINE HCL (PF) 1 % IJ SOLN
INTRAMUSCULAR | Status: AC | PRN
  Administered 2020-02-14: 5 mL via INTRADERMAL

## 2020-02-14 MED ORDER — IODIXANOL 320 MG/ML IV SOLN
50.0000 mL | Freq: Once | INTRAVENOUS | Status: AC | PRN
  Administered 2020-02-14: 30 mL via INTRA_ARTERIAL

## 2020-02-14 MED ORDER — OMEPRAZOLE 20 MG PO CPDR: 20.0000 mg | DELAYED_RELEASE_CAPSULE | Freq: Every day | ORAL | 1 refills | Status: DC

## 2020-02-14 MED ORDER — FENTANYL CITRATE (PF) 100 MCG/2ML IJ SOLN
INTRAMUSCULAR | Status: AC | PRN
  Administered 2020-02-14: 50 ug via INTRAVENOUS
  Administered 2020-02-14 (×4): 25 ug via INTRAVENOUS
  Administered 2020-02-14: 50 ug via INTRAVENOUS

## 2020-02-14 MED ORDER — IODIXANOL 320 MG/ML IV SOLN
50.0000 mL | Freq: Once | INTRAVENOUS | Status: AC | PRN
  Administered 2020-02-14: 8 mL via INTRA_ARTERIAL

## 2020-02-14 MED ORDER — HYDROCODONE-ACETAMINOPHEN 5-325 MG PO TABS: 1.0000 | ORAL_TABLET | ORAL | Status: DC | PRN

## 2020-02-14 MED ORDER — SODIUM CHLORIDE 0.9 % IV SOLN: INTRAVENOUS | Status: DC

## 2020-02-14 MED ORDER — LIDOCAINE HCL 1 % IJ SOLN
INTRAMUSCULAR | Status: AC
  Filled 2020-02-14: qty 20

## 2020-02-14 MED ORDER — MIDAZOLAM HCL 2 MG/2ML IJ SOLN
INTRAMUSCULAR | Status: AC | PRN
  Administered 2020-02-14 (×3): 1 mg via INTRAVENOUS
  Administered 2020-02-14 (×4): 0.5 mg via INTRAVENOUS
  Administered 2020-02-14: 1 mg via INTRAVENOUS

## 2020-02-14 MED ORDER — FENTANYL CITRATE (PF) 100 MCG/2ML IJ SOLN
INTRAMUSCULAR | Status: AC
  Filled 2020-02-14: qty 2

## 2020-02-14 MED ORDER — TECHNETIUM TO 99M ALBUMIN AGGREGATED
4.8000 | Freq: Once | INTRAVENOUS | Status: AC | PRN
  Administered 2020-02-14: 4.8 via INTRAVENOUS

## 2020-02-14 MED ORDER — MIDAZOLAM HCL 2 MG/2ML IJ SOLN
INTRAMUSCULAR | Status: AC
  Filled 2020-02-14: qty 6

## 2020-02-14 NOTE — Telephone Encounter (Signed)
Contacted by Larene Beach - PA in Interventional Radiology to confirm patient's last dose of Avastin was 5/26. Confirmed with chart review/Dr. Irene Limbo. Information given to [facility]

## 2020-02-14 NOTE — Discharge Instructions (Signed)
Please present to the lab at [facility] or [facility] on 02/16/20 to have your blood drawn for a BMP to evaluate your kidney function after receiving contrast during today's procedure.

## 2020-02-14 NOTE — H&P (Signed)
Chief Complaint: Patient was seen in consultation today for pre Y90 road mapping.  Referring Physician(s): [name]  Supervising Physician: Markus Daft  Patient Status: [facility] - Out-pt  History of Present Illness: Aaron [name] is a 74 y.o. male with a past medical history significant for DM II, HLD, HTN, anemia, hepatitis C and Bud diagnosed 2015 with previous history of DEB-TACE 03/13/2014 and 04/04/19, thermal ablation 06/01/2014, 02/10/2016, 07/21/2017, 06/29/18 and bland hepatic embolization 05/16/19 all of which were performed by Dr. Anselm Pancoast who presents today for pre Y90 road mapping. Aaron [name] was seen for follow up in IR on 06/28/19 and concern was noted for a new lesion in segment 7, there was also difficulty excluding new or recurrent disease around the treated lesion. At that visit it was decided to continue surveillance with a follow up MRI in 3 months which unfortunately showed marked disease progression of liver disease. His AFP had also significantly increased from previous values. He began systemic therapy with Avastin + atezolizumab every 21 days which he began on 10/26/19 with most recent dose 01/17/20 per chart. He was last seen by Dr. Irene Limbo on 01/30/20 where it was discussed that his current systemic therapy has not successfully stopped the progression of the disease but has likely slowed it's progression, he was referred back to our service to discuss further possible therapies to reduce disease burden. After discussion with Dr. Anselm Pancoast it was felt best to proceed with Y90 embolization and he presents today for pre Y90 road mapping.  Aaron [name] reports some left shoulder joint pain which has been present for a few months but denies any other complaints. His appetite has been good at home and he has been able to go about his usual activities without issue. He does note some increased tiredness after his therapy but has otherwise tolerated treatment well. He states, "I know this is something  that will kill me but I don't like to think about dying, I just keep doing what I have to do and if this is what I have do then ok." He states understanding of the procedure today as well as plan for future Y90 embolization and is agreeable to proceed with both.  Past Medical History:  Diagnosis Date  . Arthritis   . Diabetes mellitus without complication ([place])    type II   . Elevated liver enzymes   . Fatty liver   . Hepatitis C    genotype 1A status post treatment with Viekira and Ribavarin for 24 weeks  . Hepatocellular carcinoma ([place]) 01/30/2014   Path  . History of colon polyps   . Hyperlipidemia   . Hypertension   . Iron deficiency anemia    hx of   . MGUS (monoclonal gammopathy of unknown significance)     Past Surgical History:  Procedure Laterality Date  . COLONOSCOPY    . ESOPHAGOGASTRODUODENOSCOPY  2016  . IR ANGIOGRAM SELECTIVE EACH ADDITIONAL VESSEL  04/04/2019  . IR ANGIOGRAM SELECTIVE EACH ADDITIONAL VESSEL  04/04/2019  . IR ANGIOGRAM SELECTIVE EACH ADDITIONAL VESSEL  04/04/2019  . IR ANGIOGRAM SELECTIVE EACH ADDITIONAL VESSEL  04/04/2019  . IR ANGIOGRAM SELECTIVE EACH ADDITIONAL VESSEL  04/04/2019  . IR ANGIOGRAM SELECTIVE EACH ADDITIONAL VESSEL  04/04/2019  . IR ANGIOGRAM SELECTIVE EACH ADDITIONAL VESSEL  04/04/2019  . IR ANGIOGRAM SELECTIVE EACH ADDITIONAL VESSEL  05/16/2019  . IR ANGIOGRAM SELECTIVE EACH ADDITIONAL VESSEL  05/16/2019  . IR ANGIOGRAM SELECTIVE EACH ADDITIONAL VESSEL  05/16/2019  . IR ANGIOGRAM  VISCERAL SELECTIVE  04/04/2019  . IR ANGIOGRAM VISCERAL SELECTIVE  05/16/2019  . IR EMBO TUMOR ORGAN ISCHEMIA INFARCT INC GUIDE ROADMAPPING  04/04/2019  . IR EMBO TUMOR ORGAN ISCHEMIA INFARCT INC GUIDE ROADMAPPING  05/16/2019  . IR GENERIC HISTORICAL  02/26/2016   IR RADIOLOGIST EVAL & MGMT 02/26/2016 Markus Daft, MD [facility] INTERV RAD  . IR GENERIC HISTORICAL  01/16/2016   IR RADIOLOGIST EVAL & MGMT 01/16/2016 Markus Daft, MD [facility] INTERV RAD  . IR GENERIC HISTORICAL   06/24/2016   IR RADIOLOGIST EVAL & MGMT 06/24/2016 Aletta Edouard, MD [facility] INTERV RAD  . IR RADIOLOGIST EVAL & MGMT  12/17/2016  . IR RADIOLOGIST EVAL & MGMT  06/16/2017  . IR RADIOLOGIST EVAL & MGMT  09/08/2017  . IR RADIOLOGIST EVAL & MGMT  10/26/2017  . IR RADIOLOGIST EVAL & MGMT  05/12/2018  . IR RADIOLOGIST EVAL & MGMT  07/26/2018  . IR RADIOLOGIST EVAL & MGMT  03/22/2019  . IR RADIOLOGIST EVAL & MGMT  04/11/2019  . IR RADIOLOGIST EVAL & MGMT  06/28/2019  . IR RADIOLOGIST EVAL & MGMT  10/10/2019  . IR US GUIDE VASC ACCESS LEFT  05/16/2019  . IR US GUIDE VASC ACCESS RIGHT  04/04/2019  . POLYPECTOMY    . RADIOFREQUENCY ABLATION N/A 07/21/2017   Procedure: CT MICROWAVE THERMAL ABLATION-LIVER;  Surgeon: Markus Daft, MD;  Location: [facility] ORS;  Service: Anesthesiology;  Laterality: N/A;  . RADIOFREQUENCY ABLATION N/A 06/29/2018   Procedure: CT MICROWAVE THERMAL ABLATION;  Surgeon: Markus Daft, MD;  Location: [facility] ORS;  Service: Anesthesiology;  Laterality: N/A;    Allergies: Patient has no known allergies.  Medications: Prior to Admission medications   Medication Sig Start Date End Date Taking? Authorizing Provider  ACCU-CHEK AVIVA PLUS test strip  11/19/19   [provider]  Accu-Chek Softclix Lancets lancets  09/12/19   [provider]  ALPRAZolam Duanne Moron) 0.5 MG tablet Take 1 tablet (0.5 mg total) by mouth as directed. Take 2 tablets (1.0 mg total) prior to MR scan.  Make take an additional 0.5 mg po if needed. Patient not taking: Reported on 07/28/2019 10/26/17   Markus Daft, MD  amLODipine (NORVASC) 10 MG tablet Take 1 tablet (10 mg total) by mouth daily. 11/29/19   Brunetta Genera, MD  aspirin EC 81 MG tablet Take 81 mg by mouth every morning.     [provider]  cholecalciferol (VITAMIN D) 1000 UNITS tablet Take 1,000 Units by mouth every morning.     [provider]  Insulin Glargine (LANTUS SOLOSTAR) 100 UNIT/ML Solostar Pen Inject 32 Units into the skin daily.     [provider]  lisinopril-hydrochlorothiazide (ZESTORETIC) 20-12.5 MG tablet TAKE 2 TABLETS BY MOUTH DAILY WITH BREAKFAST. 02/12/20   Brunetta Genera, MD  metFORMIN (GLUCOPHAGE) 1000 MG tablet Take 1,000 mg by mouth 2 (two) times daily with a meal.    [provider]  Multiple Vitamin (MULTIVITAMIN WITH MINERALS) TABS tablet Take 1 tablet daily by mouth.    [provider]  oxyCODONE (OXY IR/ROXICODONE) 5 MG immediate release tablet Take 1 tablet (5 mg total) by mouth every 6 (six) hours as needed for up to 15 doses for moderate pain or severe pain. Patient not taking: Reported on 07/28/2019 06/30/18   Candiss Norse A, PA-C  PAZEO 0.7 % SOLN Place 1 drop into both eyes every morning.  05/09/18   [provider]  simvastatin (ZOCOR) 40 MG tablet Take 20 mg by  mouth every evening.     [provider]     No family history on file.  Social History   Socioeconomic History  . Marital status: Married    Spouse name: Not on file  . Number of children: Not on file  . Years of education: Not on file  . Highest education level: Not on file  Occupational History  . Not on file  Tobacco Use  . Smoking status: Former Smoker    Quit date: 01/30/1994    Years since quitting: 26.0  . Smokeless tobacco: Never Used  . Tobacco comment: stopped 25 yrs ago  Vaping Use  . Vaping Use: Never used  Substance and Sexual Activity  . Alcohol use: No    Comment: stopped 30 years ago   . Drug use: No  . Sexual activity: Not Currently  Other Topics Concern  . Not on file  Social History Narrative  . Not on file   Social Determinants of Health   Financial Resource Strain:   . Difficulty of Paying Living Expenses:   Food Insecurity:   . Worried About Charity fundraiser in the Last Year:   . Arboriculturist in the Last Year:   Transportation Needs:   . Film/video editor (Medical):   [name] Kitchen Lack of Transportation (Non-Medical):   Physical Activity:    . Days of Exercise per Week:   . Minutes of Exercise per Session:   Stress:   . Feeling of Stress :   Social Connections:   . Frequency of Communication with Friends and Family:   . Frequency of Social Gatherings with Friends and Family:   . Attends Religious Services:   . Active Member of Clubs or Organizations:   . Attends Archivist Meetings:   [name] Kitchen Marital Status:      Review of Systems: A 12 point ROS discussed and pertinent positives are indicated in the HPI above.  All other systems are negative.  Review of Systems  Constitutional: Negative for appetite change, chills and fever.  Respiratory: Negative for cough and shortness of breath.   Cardiovascular: Negative for chest pain.  Gastrointestinal: Negative for abdominal pain, blood in stool, diarrhea, nausea and vomiting.  Genitourinary: Negative for dysuria and hematuria.  Musculoskeletal: Positive for arthralgias. Negative for back pain and gait problem.  Skin: Negative for rash and wound.  Neurological: Negative for dizziness, numbness and headaches.    Vital Signs: BP (!) 156/81 (BP Location: Right Arm)   Pulse 86   Temp 98.6 F (37 C) (Oral)   Resp 17   Ht 6' (1.829 m)   Wt 249 lb (112.9 kg)   SpO2 100%   BMI 33.77 kg/m   Physical Exam Vitals reviewed.  Constitutional:      General: He is not in acute distress. HENT:     Head: Normocephalic.     Mouth/Throat:     Mouth: Mucous membranes are moist.     Pharynx: Oropharynx is clear. No oropharyngeal exudate or posterior oropharyngeal erythema.  Cardiovascular:     Rate and Rhythm: Normal rate and regular rhythm.  Pulmonary:     Effort: Pulmonary effort is normal.     Breath sounds: Normal breath sounds.  Abdominal:     General: Bowel sounds are normal. There is no distension.     Palpations: Abdomen is soft.     Tenderness: There is no abdominal tenderness.  Skin:    General: Skin is warm and  dry.  Neurological:     Mental Status: He is  alert and oriented to person, place, and time.  Psychiatric:        Mood and Affect: Mood normal.        Behavior: Behavior normal.        Thought Content: Thought content normal.        Judgment: Judgment normal.      MD Evaluation Airway: WNL Heart: WNL Abdomen: WNL Chest/ Lungs: WNL ASA  Classification: 3 Mallampati/Airway Score: Two   Imaging: [name]  Result Date: 01/27/2020 CLINICAL DATA:  Multifocal hepatocellular carcinoma, evaluate treatment response EXAM: MRI ABDOMEN WITHOUT AND WITH CONTRAST TECHNIQUE: Multiplanar multisequence MR imaging of the abdomen was performed both before and after the administration of intravenous contrast. CONTRAST:  15mL GADAVIST GADOBUTROL 1 MMOL/ML IV SOLN COMPARISON:  10/02/2019, 06/21/2019 FINDINGS: Lower chest: No acute findings. Hepatobiliary: No significant change in subcapsular ablation defect of the lateral liver dome, hepatic segment VII (series 18, image 25). No residual abnormal contrast enhancement. There are multiple additional liver masses, generally with hypoenhancement to liver parenchyma and peripheral capsular enhancement. The largest mass in the anterior right lobe of the liver, hepatic segment V/VIII, about an additional ablation defect, is increased in size compared to prior examination, inferior component measuring approximately 6.7 x 6.6 cm, previously 6.5 x 6.4 cm when measured similarly (series 18, image 37), and a superior component measuring 6.8 x 3.1 cm, previously 5.0 x 3.0 cm when measured similarly (series 18, image 27). Lesion of the central left lobe of the liver is enlarged, measuring 1.5 cm, previously 1.2 cm (series 18, image 36). Lesion of the medial liver dome, hepatic segment V, is enlarged, measuring 1.4 cm, previously 1.0 cm (series 18, image 22). Lesion of the central liver dome, hepatic segment VII, is enlarged, measuring 2.6 x 2.4 cm (series 18, image 26). Unchanged small fluid signal cyst of the  posterior liver dome. Pancreas: No mass, inflammatory changes, or other parenchymal abnormality identified. Spleen:  Within normal limits in size and appearance. Adrenals/Urinary Tract: No masses identified. No evidence of hydronephrosis. Stomach/Bowel: Visualized portions within the abdomen are unremarkable. Vascular/Lymphatic: No pathologically enlarged lymph nodes identified. No abdominal aortic aneurysm demonstrated. Other:  None. Musculoskeletal: No suspicious bone lesions identified. IMPRESSION: 1. Interval enlargement of multiple hepatic masses as detailed above, consistent with progression of multifocal hepatocellular carcinoma. There is evidence of prior ablation in hepatic segments VII and VIII. 2. No evidence of metastatic disease in the abdomen. Electronically Signed   By: Eddie Candle M.D.   On: 01/27/2020 15:51    Labs:  CBC: Recent Labs    11/29/19 0916 12/06/19 1342 12/27/19 0848 01/17/20 1419  WBC 4.8 5.3 5.1 5.4  HGB 11.8* 12.3* 12.6* 13.2  HCT 39.0 40.6 41.6 43.5  PLT 232 261 256 281    COAGS: Recent Labs    03/30/19 0741 04/04/19 1235 05/16/19 0800  INR 0.9 0.9 1.0    BMP: Recent Labs    11/29/19 0916 12/06/19 1342 12/27/19 0848 01/17/20 1419  NA 142 142 139 140  K 5.3* 4.4 4.7 4.8  CL 103 107 105 102  CO2 29 24 25 26   GLUCOSE 170* 137* 157* 97  BUN 32* 28* 24* 33*  CALCIUM 10.0 9.7 9.8 10.4*  CREATININE 1.94* 1.68* 1.99* 1.92*  GFRNONAA 33* 40* 32* 34*  GFRAA 39* 46* 37* 39*    LIVER FUNCTION TESTS: Recent Labs    11/29/19 0916  12/06/19 1342 12/27/19 0848 01/17/20 1419  BILITOT 0.6 0.6 0.5 0.7  AST 40 38 34 42*  ALT 20 23 19 20   ALKPHOS 39 46 43 41  PROT 8.1 8.5* 8.3* 8.7*  ALBUMIN 4.1 4.4 4.1 4.3    TUMOR MARKERS: No results for input(s): AFPTM, CEA, CA199, CHROMGRNA in the last 8760 hours.  Assessment and Plan:  74 y/o M with history of [place] followed by IR since 2015 who has undergone multiple liver directed therapies.  Unfortunately most recent imaging noted disease progression and he has begun systemic therapy which he has tolerated well however has not been shown to stop disease progression. He was referred back to our service to discuss further possible targeted therapies and after discussion with Dr. Anselm Pancoast it was felt best to proceed with Y90 embolization. He presents today for pre Y90 road mapping with possible pre-procedure embolization.  Patient has been NPO since 8 pm, no current anticoagulation/antiplatelet medications. Afebrile, WBC 5.4, hgb 13.8, plt 244, INR 0.9, creatinine 1.78, AFP and CEA pending at time of this note writing.  Risks and benefits discussed with the patient including, but not limited to bleeding, infection, vascular injury, post procedural pain, nausea, vomiting and fatigue, contrast induced renal failure, liver failure and possible need for additional procedures.  All of the patient's questions were answered, patient is agreeable to proceed. Consent signed and in chart.   Thank you for this interesting consult.  I greatly enjoyed meeting Aaron [name] and look forward to participating in their care.  A copy of this report was sent to the requesting provider on this date.  Electronically Signed: Joaquim Nam, PA-C 02/14/2020, 8:52 AM   I spent a total of   25 Minutes in face to face in clinical consultation, greater than 50% of which was counseling/coordinating care for pre Y90.

## 2020-02-14 NOTE — Discharge Instructions (Addendum)
Y 90, Care After This sheet gives you information about how to care for yourself after your procedure. Your doctor may also give you more specific instructions. If you have problems or questions, contact your doctor. Follow these instructions at home: Insertion site care  Follow instructions from your doctor about how to take care of your long, thin tube (catheter) insertion area. Make sure you: ? Wash your hands with soap and water before you change your bandage (dressing). If you cannot use soap and water, use hand sanitizer. ? Change your bandage as told by your doctor. ? Leave stitches (sutures), skin glue, or skin tape (adhesive) strips in place. They may need to stay in place for 2 weeks or longer. If tape strips get loose and curl up, you may trim the loose edges. Do not remove tape strips completely unless your doctor says it is okay.  Do not take baths, swim, or use a hot tub until your doctor says it is okay.  You may shower 24-48 hours after the procedure or as told by your doctor. ? Gently wash the area with plain soap and water. ? Pat the area dry with a clean towel. ? Do not rub the area. This may cause bleeding.  Do not apply powder or lotion to the area. Keep the area clean and dry.  Check your insertion area every day for signs of infection. Check for: ? More redness, swelling, or pain. ? Fluid or blood. ? Warmth. ? Pus or a bad smell. Activity  Rest as told by your doctor, usually for 1-2 days.  Do not lift anything that is heavier than 10 lbs. (4.5 kg) or as told by your doctor.  Do not drive for 24 hours if you were given a medicine to help you relax (sedative).  Do not drive or use heavy machinery while taking prescription pain medicine. General instructions   Go back to your normal activities as told by your doctor, usually in about a week. Ask your doctor what activities are safe for you.  If the insertion area starts to bleed, lie flat and put  pressure on the area. If the bleeding does not stop, get help right away. This is an emergency.  Drink enough fluid to keep your pee (urine) clear or pale yellow.  Take over-the-counter and prescription medicines only as told by your doctor.  Keep all follow-up visits as told by your doctor. This is important. Contact a doctor if:  You have a fever.  You have chills.  You have more redness, swelling, or pain around your insertion area.  You have fluid or blood coming from your insertion area.  The insertion area feels warm to the touch.  You have pus or a bad smell coming from your insertion area.  You have more bruising around the insertion area.  Blood collects in the tissue around the insertion area (hematoma) that may be painful to the touch. Get help right away if:  You have a lot of pain in the insertion area.  The insertion area swells very fast.  The insertion area is bleeding, and the bleeding does not stop after holding steady pressure on the area.  The area near or just beyond the insertion area becomes pale, cool, tingly, or numb. These symptoms may be an emergency. Do not wait to see if the symptoms will go away. Get medical help right away. Call your local emergency services (911 in the [place] Do not drive  yourself to the hospital. Summary  After the procedure, it is common to have bruising and tenderness at the long, thin tube insertion area.  After the procedure, it is important to rest and drink plenty of fluids.  Do not take baths, swim, or use a hot tub until your doctor says it is okay to do so. You may shower 24-48 hours after the procedure or as told by your doctor.  If the insertion area starts to bleed, lie flat and put pressure on the area. If the bleeding does not stop, get help right away. This is an emergency. This information is not intended to replace advice given to you by your health care provider. Make sure you discuss any questions you have  with your health care provider. Document Revised: 07/23/2017 Document Reviewed: 08/04/2016 Elsevier Patient Education  [place].    Moderate Conscious Sedation, Adult, Care After These instructions provide you with information about caring for yourself after your procedure. Your health care provider may also give you more specific instructions. Your treatment has been planned according to current medical practices, but problems sometimes occur. Call your health care provider if you have any problems or questions after your procedure. What can I expect after the procedure? After your procedure, it is common:  To feel sleepy for several hours.  To feel clumsy and have poor balance for several hours.  To have poor judgment for several hours.  To vomit if you eat too soon. Follow these instructions at home: For at least 24 hours after the procedure:   Do not: ? Participate in activities where you could fall or become injured. ? Drive. ? Use heavy machinery. ? Drink alcohol. ? Take sleeping pills or medicines that cause drowsiness. ? Make important decisions or sign legal documents. ? Take care of children on your own.  Rest. Eating and drinking  Follow the diet recommended by your health care provider.  If you vomit: ? Drink water, juice, or soup when you can drink without vomiting. ? Make sure you have little or no nausea before eating solid foods. General instructions  Have a responsible adult stay with you until you are awake and alert.  Take over-the-counter and prescription medicines only as told by your health care provider.  If you smoke, do not smoke without supervision.  Keep all follow-up visits as told by your health care provider. This is important. Contact a health care provider if:  You keep feeling nauseous or you keep vomiting.  You feel light-headed.  You develop a rash.  You have a fever. Get help right away if:  You have trouble  breathing. This information is not intended to replace advice given to you by your health care provider. Make sure you discuss any questions you have with your health care provider. Document Revised: 07/23/2017 Document Reviewed: 11/30/2015 Elsevier Patient Education  2020 [employer].

## 2020-02-14 NOTE — Procedures (Signed)
Interventional Radiology Procedure:   Indications: Multifocal hepatocellular carcinoma  Procedure: Visceral angiography with embolization of GDA and right gastric artery.  Injection of Tc75m MAA into bilateral hepatic arteries  Findings: Hypovascular liver tumors.   Successful coil embolization of GDA and proximal right gastric artery.    Complications: None     EBL: less than 20 ml  Plan: 4 hour bedrest and continue IV and oral hydration.     Daina Cara R. Anselm Pancoast, MD  Pager: [phone]

## 2020-02-16 ENCOUNTER — Other Ambulatory Visit (HOSPITAL_COMMUNITY)
Admission: RE | Admit: 2020-02-16 | Discharge: 2020-02-16 | Disposition: A | Discharge status: Home / Self Care | Payer: Medicare HMO | Source: Ambulatory Visit | Attending: Diagnostic Radiology | Admitting: Diagnostic Radiology

## 2020-02-16 DIAGNOSIS — N189 Chronic kidney disease, unspecified: Secondary | ICD-10-CM | POA: Diagnosis not present

## 2020-02-16 LAB — BASIC METABOLIC PANEL
Anion gap: 9 (ref 5–15)
BUN: 25 mg/dL — ABNORMAL HIGH (ref 8–23)
CO2: 28 mmol/L (ref 22–32)
Calcium: 9.3 mg/dL (ref 8.9–10.3)
Chloride: 101 mmol/L (ref 98–111)
Creatinine, Ser: 1.63 mg/dL — ABNORMAL HIGH (ref 0.61–1.24)
GFR calc Af Amer: 48 mL/min — ABNORMAL LOW (ref >60)
GFR calc non Af Amer: 41 mL/min — ABNORMAL LOW (ref >60)
Glucose, Bld: 159 mg/dL — ABNORMAL HIGH (ref 70–99)
Potassium: 5 mmol/L (ref 3.5–5.1)
Sodium: 138 mmol/L (ref 135–145)

## 2020-02-16 LAB — CEA: CEA: 3.8 ng/mL (ref 0.0–4.7)

## 2020-02-16 LAB — AFP TUMOR MARKER: AFP, Serum, Tumor Marker: 218 ng/mL — ABNORMAL HIGH (ref 0.0–8.3)

## 2020-02-29 ENCOUNTER — Other Ambulatory Visit: Payer: Self-pay | Admitting: Student

## 2020-03-01 ENCOUNTER — Encounter (HOSPITAL_COMMUNITY)
Admission: RE | Admit: 2020-03-01 | Discharge: 2020-03-01 | Disposition: A | Discharge status: Home / Self Care | Payer: Medicare HMO | Source: Ambulatory Visit | Attending: Diagnostic Radiology | Admitting: Diagnostic Radiology

## 2020-03-01 ENCOUNTER — Other Ambulatory Visit: Payer: Self-pay

## 2020-03-01 ENCOUNTER — Ambulatory Visit (HOSPITAL_COMMUNITY)
Admission: RE | Admit: 2020-03-01 | Discharge: 2020-03-01 | Disposition: A | Discharge status: Home / Self Care | Payer: Medicare HMO | Source: Ambulatory Visit | Attending: Diagnostic Radiology | Admitting: Diagnostic Radiology

## 2020-03-01 ENCOUNTER — Other Ambulatory Visit: Payer: Self-pay | Admitting: Radiology

## 2020-03-01 ENCOUNTER — Other Ambulatory Visit (HOSPITAL_COMMUNITY): Payer: Self-pay | Admitting: Radiology

## 2020-03-01 ENCOUNTER — Encounter (HOSPITAL_COMMUNITY): Payer: Self-pay

## 2020-03-01 ENCOUNTER — Other Ambulatory Visit (HOSPITAL_COMMUNITY): Payer: Self-pay | Admitting: Diagnostic Radiology

## 2020-03-01 DIAGNOSIS — D509 Iron deficiency anemia, unspecified: Secondary | ICD-10-CM | POA: Diagnosis not present

## 2020-03-01 DIAGNOSIS — E785 Hyperlipidemia, unspecified: Secondary | ICD-10-CM | POA: Diagnosis not present

## 2020-03-01 DIAGNOSIS — Z79899 Other long term (current) drug therapy: Secondary | ICD-10-CM | POA: Diagnosis not present

## 2020-03-01 DIAGNOSIS — Z7982 Long term (current) use of aspirin: Secondary | ICD-10-CM | POA: Diagnosis not present

## 2020-03-01 DIAGNOSIS — C220 Liver cell carcinoma: Secondary | ICD-10-CM

## 2020-03-01 DIAGNOSIS — I10 Essential (primary) hypertension: Secondary | ICD-10-CM | POA: Insufficient documentation

## 2020-03-01 DIAGNOSIS — Z87891 Personal history of nicotine dependence: Secondary | ICD-10-CM | POA: Insufficient documentation

## 2020-03-01 DIAGNOSIS — E119 Type 2 diabetes mellitus without complications: Secondary | ICD-10-CM | POA: Insufficient documentation

## 2020-03-01 DIAGNOSIS — Z794 Long term (current) use of insulin: Secondary | ICD-10-CM | POA: Diagnosis not present

## 2020-03-01 HISTORY — PX: IR ANGIOGRAM VISCERAL SELECTIVE: IMG657

## 2020-03-01 HISTORY — PX: IR ANGIOGRAM SELECTIVE EACH ADDITIONAL VESSEL: IMG667

## 2020-03-01 HISTORY — PX: IR US GUIDE VASC ACCESS RIGHT: IMG2390

## 2020-03-01 HISTORY — PX: IR EMBO TUMOR ORGAN ISCHEMIA INFARCT INC GUIDE ROADMAPPING: IMG5449

## 2020-03-01 LAB — COMPREHENSIVE METABOLIC PANEL
ALT: 17 U/L (ref 0–44)
AST: 34 U/L (ref 15–41)
Albumin: 4 g/dL (ref 3.5–5.0)
Alkaline Phosphatase: 58 U/L (ref 38–126)
Anion gap: 13 (ref 5–15)
BUN: 31 mg/dL — ABNORMAL HIGH (ref 8–23)
CO2: 25 mmol/L (ref 22–32)
Calcium: 9.6 mg/dL (ref 8.9–10.3)
Chloride: 102 mmol/L (ref 98–111)
Creatinine, Ser: 1.65 mg/dL — ABNORMAL HIGH (ref 0.61–1.24)
GFR calc Af Amer: 47 mL/min — ABNORMAL LOW (ref >60)
GFR calc non Af Amer: 41 mL/min — ABNORMAL LOW (ref >60)
Glucose, Bld: 136 mg/dL — ABNORMAL HIGH (ref 70–99)
Potassium: 4.8 mmol/L (ref 3.5–5.1)
Sodium: 140 mmol/L (ref 135–145)
Total Bilirubin: 0.5 mg/dL (ref 0.3–1.2)
Total Protein: 8.7 g/dL — ABNORMAL HIGH (ref 6.5–8.1)

## 2020-03-01 LAB — CBC WITH DIFFERENTIAL/PLATELET
Abs Immature Granulocytes: 0.1 10*3/uL — ABNORMAL HIGH (ref 0.00–0.07)
Basophils Absolute: 0.1 10*3/uL (ref 0.0–0.1)
Basophils Relative: 1 %
Eosinophils Absolute: 0.2 10*3/uL (ref 0.0–0.5)
Eosinophils Relative: 3 %
HCT: 42.4 % (ref 39.0–52.0)
Hemoglobin: 12.9 g/dL — ABNORMAL LOW (ref 13.0–17.0)
Immature Granulocytes: 1 %
Lymphocytes Relative: 21 %
Lymphs Abs: 1.6 10*3/uL (ref 0.7–4.0)
MCH: 22.5 pg — ABNORMAL LOW (ref 26.0–34.0)
MCHC: 30.4 g/dL (ref 30.0–36.0)
MCV: 73.9 fL — ABNORMAL LOW (ref 80.0–100.0)
Monocytes Absolute: 0.8 10*3/uL (ref 0.1–1.0)
Monocytes Relative: 10 %
Neutro Abs: 4.7 10*3/uL (ref 1.7–7.7)
Neutrophils Relative %: 64 %
Platelets: 428 10*3/uL — ABNORMAL HIGH (ref 150–400)
RBC: 5.74 MIL/uL (ref 4.22–5.81)
RDW: 15.7 % — ABNORMAL HIGH (ref 11.5–15.5)
WBC: 7.4 10*3/uL (ref 4.0–10.5)
nRBC: 0 % (ref 0.0–0.2)

## 2020-03-01 LAB — PROTIME-INR
INR: 1 (ref 0.8–1.2)
Prothrombin Time: 12.4 seconds (ref 11.4–15.2)

## 2020-03-01 LAB — GLUCOSE, CAPILLARY
Glucose-Capillary: 125 mg/dL — ABNORMAL HIGH (ref 70–99)
Glucose-Capillary: 222 mg/dL — ABNORMAL HIGH (ref 70–99)

## 2020-03-01 MED ORDER — IODIXANOL 320 MG/ML IV SOLN
50.0000 mL | Freq: Once | INTRAVENOUS | Status: AC | PRN
  Administered 2020-03-01: 35 mL via INTRA_ARTERIAL

## 2020-03-01 MED ORDER — FENTANYL CITRATE (PF) 100 MCG/2ML IJ SOLN
INTRAMUSCULAR | Status: AC
  Filled 2020-03-01: qty 2

## 2020-03-01 MED ORDER — DEXAMETHASONE SODIUM PHOSPHATE 10 MG/ML IJ SOLN
8.0000 mg | Freq: Once | INTRAMUSCULAR | Status: AC
  Administered 2020-03-01: 8 mg via INTRAVENOUS
  Filled 2020-03-01: qty 1

## 2020-03-01 MED ORDER — PANTOPRAZOLE SODIUM 40 MG IV SOLR
40.0000 mg | Freq: Once | INTRAVENOUS | Status: AC
  Administered 2020-03-01: 40 mg via INTRAVENOUS

## 2020-03-01 MED ORDER — FENTANYL CITRATE (PF) 100 MCG/2ML IJ SOLN
INTRAMUSCULAR | Status: AC
  Filled 2020-03-01: qty 4

## 2020-03-01 MED ORDER — LIDOCAINE HCL (PF) 1 % IJ SOLN
INTRAMUSCULAR | Status: AC | PRN
  Administered 2020-03-01: 10 mL via INTRADERMAL

## 2020-03-01 MED ORDER — FENTANYL CITRATE (PF) 100 MCG/2ML IJ SOLN
INTRAMUSCULAR | Status: AC | PRN
  Administered 2020-03-01 (×8): 50 ug via INTRAVENOUS

## 2020-03-01 MED ORDER — SODIUM CHLORIDE 0.9 % IV SOLN: INTRAVENOUS | Status: DC

## 2020-03-01 MED ORDER — IOHEXOL 300 MG/ML  SOLN
50.0000 mL | Freq: Once | INTRAMUSCULAR | Status: AC | PRN
  Administered 2020-03-01: 10 mL via INTRA_ARTERIAL

## 2020-03-01 MED ORDER — MIDAZOLAM HCL 2 MG/2ML IJ SOLN
INTRAMUSCULAR | Status: AC | PRN
  Administered 2020-03-01: 2 mg via INTRAVENOUS
  Administered 2020-03-01 (×2): 1 mg via INTRAVENOUS
  Administered 2020-03-01 (×6): 0.5 mg via INTRAVENOUS
  Administered 2020-03-01: 1 mg via INTRAVENOUS

## 2020-03-01 MED ORDER — ONDANSETRON HCL 4 MG/2ML IJ SOLN
8.0000 mg | Freq: Once | INTRAMUSCULAR | Status: AC
  Administered 2020-03-01: 8 mg via INTRAVENOUS
  Filled 2020-03-01: qty 4

## 2020-03-01 MED ORDER — PIPERACILLIN-TAZOBACTAM 3.375 G IVPB 30 MIN
3.3750 g | INTRAVENOUS | Status: AC
  Administered 2020-03-01: 3.375 g via INTRAVENOUS
  Filled 2020-03-01: qty 50

## 2020-03-01 MED ORDER — LIDOCAINE HCL 1 % IJ SOLN
INTRAMUSCULAR | Status: AC
  Filled 2020-03-01: qty 20

## 2020-03-01 MED ORDER — OXYCODONE HCL 5 MG PO TABS: 5.0000 mg | ORAL_TABLET | Freq: Four times a day (QID) | ORAL | Status: DC | PRN

## 2020-03-01 MED ORDER — OXYCODONE HCL 5 MG PO TABS: 5.0000 mg | ORAL_TABLET | Freq: Four times a day (QID) | ORAL | 0 refills | Status: DC | PRN

## 2020-03-01 MED ORDER — OXYCODONE HCL 5 MG PO TABS: 5.0000 mg | ORAL_TABLET | ORAL | Status: DC | PRN

## 2020-03-01 MED ORDER — MIDAZOLAM HCL 2 MG/2ML IJ SOLN
INTRAMUSCULAR | Status: AC
  Filled 2020-03-01: qty 2

## 2020-03-01 MED ORDER — MIDAZOLAM HCL 2 MG/2ML IJ SOLN
INTRAMUSCULAR | Status: AC
  Filled 2020-03-01: qty 6

## 2020-03-01 MED ORDER — GELATIN ABSORBABLE 12-7 MM EX MISC
CUTANEOUS | Status: AC
  Filled 2020-03-01: qty 1

## 2020-03-01 MED ORDER — PANTOPRAZOLE SODIUM 40 MG IV SOLR
INTRAVENOUS | Status: AC
  Filled 2020-03-01: qty 40

## 2020-03-01 NOTE — Discharge Instructions (Addendum)
For questions concerning the femoral site; may call on-call Interventional Radiologist staff [phone] Post Y-90 Radioembolization Discharge Instructions  You have been given a radioactive material during your procedure.  While it is safe for you to be discharged home from the hospital, you need to proceed directly home.    Do not use public transportation, including air travel, lasting more than 2 hours for 1 week. Avoid crowded public places for 1 week. Adult visitors should try to avoid close contact with you for 1 week.   Children and pregnant females should not visit or have close contact with you for 1 week. Items that you touch are not radioactive.  Do not sleep in the same bed as your partner for 1 week, and a condom should be used for sexual activity during the first 24 hours.  Your blood may be radioactive and caution should be used if any bleeding occurs during the recovery period.  Body fluids may be radioactive for 24 hours.  Wash your hands after voiding.  Men should sit to urinate.  Dispose of any soiled materials (flush down toilet or place in trash at home) during the first day.  Drink 6 to 8 glasses of fluids per day for 5 days to hydrate yourself.  If you need to see a doctor during the first week, you must let them know that you were treated with yttrium-90 microspheres, and will be slightly radioactive.  They can call Interventional Radiology [phone] with any questions.   Femoral Site Care This sheet gives you information about how to care for yourself after your procedure. Your health care provider may also give you more specific instructions. If you have problems or questions, contact your health care provider. What can I expect after the procedure? After the procedure, it is common to have:  Bruising that usually fades within 1-2 weeks.  Tenderness at the site. Follow these instructions at home: Wound care  Follow instructions from your health care  provider about how to take care of your insertion site. Make sure you: ? Wash your hands with soap and water before you change your bandage (dressing). If soap and water are not available, use hand sanitizer. ? Change your dressing as told by your health care provider. ? Leave stitches (sutures), skin glue, or adhesive strips in place. These skin closures may need to stay in place for 2 weeks or longer. If adhesive strip edges start to loosen and curl up, you may trim the loose edges. Do not remove adhesive strips completely unless your health care provider tells you to do that.  Do not take baths, swim, or use a hot tub until your health care provider approves.  You may shower 24-48 hours after the procedure or as told by your health care provider. ? Gently wash the site with plain soap and water. ? Pat the area dry with a clean towel. ? Do not rub the site. This may cause bleeding.  Do not apply powder or lotion to the site. Keep the site clean and dry.  Check your femoral site every day for signs of infection. Check for: ? Redness, swelling, or pain. ? Fluid or blood. ? Warmth. ? Pus or a bad smell. Activity  For the first 2-3 days after your procedure, or as long as directed: ? Avoid climbing stairs as much as possible. ? Do not squat.  Do not lift anything that is heavier than 10 lb (4.5 kg), or the limit that you are told,  until your health care provider says that it is safe.  Rest as directed. ? Avoid sitting for a long time without moving. Get up to take short walks every 1-2 hours.  Do not drive for 24 hours if you were given a medicine to help you relax (sedative). General instructions  Take over-the-counter and prescription medicines only as told by your health care provider.  Keep all follow-up visits as told by your health care provider. This is important. Contact a health care provider if you have:  A fever or chills.  You have redness, swelling, or pain around  your insertion site. Get help right away if:  The catheter insertion area swells very fast.  You pass out.  You suddenly start to sweat or your skin gets clammy.  The catheter insertion area is bleeding, and the bleeding does not stop when you hold steady pressure on the area.  The area near or just beyond the catheter insertion site becomes pale, cool, tingly, or numb. These symptoms may represent a serious problem that is an emergency. Do not wait to see if the symptoms will go away. Get medical help right away. Call your local emergency services (911 in the [place] Do not drive yourself to the hospital. Summary  After the procedure, it is common to have bruising that usually fades within 1-2 weeks.  Check your femoral site every day for signs of infection.  Do not lift anything that is heavier than 10 lb (4.5 kg), or the limit that you are told, until your health care provider says that it is safe. This information is not intended to replace advice given to you by your health care provider. Make sure you discuss any questions you have with your health care provider. Document Revised: 08/23/2017 Document Reviewed: 08/23/2017 Elsevier Patient Education  [place]. Moderate Conscious Sedation, Adult, Care After These instructions provide you with information about caring for yourself after your procedure. Your health care provider may also give you more specific instructions. Your treatment has been planned according to current medical practices, but problems sometimes occur. Call your health care provider if you have any problems or questions after your procedure. What can I expect after the procedure? After your procedure, it is common:  To feel sleepy for several hours.  To feel clumsy and have poor balance for several hours.  To have poor judgment for several hours.  To vomit if you eat too soon. Follow these instructions at home: For at least 24 hours after the  procedure:   Do not: ? Participate in activities where you could fall or become injured. ? Drive. ? Use heavy machinery. ? Drink alcohol. ? Take sleeping pills or medicines that cause drowsiness. ? Make important decisions or sign legal documents. ? Take care of children on your own.  Rest. Eating and drinking  Follow the diet recommended by your health care provider.  If you vomit: ? Drink water, juice, or soup when you can drink without vomiting. ? Make sure you have little or no nausea before eating solid foods. General instructions  Have a responsible adult stay with you until you are awake and alert.  Take over-the-counter and prescription medicines only as told by your health care provider.  If you smoke, do not smoke without supervision.  Keep all follow-up visits as told by your health care provider. This is important. Contact a health care provider if:  You keep feeling nauseous or you keep vomiting.  You feel  light-headed.  You develop a rash.  You have a fever. Get help right away if:  You have trouble breathing. This information is not intended to replace advice given to you by your health care provider. Make sure you discuss any questions you have with your health care provider. Document Revised: 07/23/2017 Document Reviewed: 11/30/2015 Elsevier Patient Education  2020 [employer].

## 2020-03-01 NOTE — Sedation Documentation (Signed)
Condom catheter placed per order. Patient tolerated procedure well.

## 2020-03-01 NOTE — Procedures (Signed)
Interventional Radiology Procedure:   Indications: Multifocal HCC  Procedure: Hepatic arteriography and Y90 radioembolization to left hepatic artery.   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  Concern for supraduodenal branch being supplied by distal main branch of right hepatic artery.  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  Therefore, attention was directed to left hepatic lobe.  Y90 radioembolization performed in left hepatic artery without complication.  Antegrade flow in left hepatic artery after dose given.    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  Dissection resulted in slow flow but still patent.     EBL: less than 20 ml  Plan: Bedrest 4 hours.  Hydrate today.  Nuclear medicine scan.     Avelynn Sellin R. Anselm Pancoast, MD  Pager: [phone]

## 2020-03-01 NOTE — Consult Note (Addendum)
Chief Complaint: Hepatocellular Carcinoma  Referring Physician(s): Dr. Irene Limbo  Supervising Physician: Markus Daft  Patient Status: [facility] - Out-pt  History of Present Illness: Aaron [name] is a 74 y.o. male Familiar to the IR service. History of DM, HLD, HTN, hepatitis C, hepatocellular carcinoma. IR performed a TACE on 7.21.15, 8.11.20, and 9.22.20, and  thermal ablation on 10.9.15, 6.19.17, 11.28.18, 11.6.19. Patient was last seen in IR for a Y90 performed on 6.23.21 with embolization of the gastroduodenal artery and the right gastric artery. Patient referred to IR by Dr. Irene Limbo who discuss alternative therapy treatment.  Patient presents today for Y90 embolization with possible additional coil  embolization.  Patient is denying any pain at this time but states that "I know I have a lot of tumors and I need to get rid of them," Patient states that he had pain post procedurally on 6.23.21 procedure. Patient is requesting pain medication upon discharge   Past Medical History:  Diagnosis Date  . Arthritis   . Diabetes mellitus without complication ([place])    type II   . Elevated liver enzymes   . Fatty liver   . Hepatitis C    genotype 1A status post treatment with Viekira and Ribavarin for 24 weeks  . Hepatocellular carcinoma ([place]) 01/30/2014   Path  . History of colon polyps   . Hyperlipidemia   . Hypertension   . Iron deficiency anemia    hx of   . MGUS (monoclonal gammopathy of unknown significance)     Past Surgical History:  Procedure Laterality Date  . COLONOSCOPY    . ESOPHAGOGASTRODUODENOSCOPY  2016  . IR ANGIOGRAM SELECTIVE EACH ADDITIONAL VESSEL  04/04/2019  . IR ANGIOGRAM SELECTIVE EACH ADDITIONAL VESSEL  04/04/2019  . IR ANGIOGRAM SELECTIVE EACH ADDITIONAL VESSEL  04/04/2019  . IR ANGIOGRAM SELECTIVE EACH ADDITIONAL VESSEL  04/04/2019  . IR ANGIOGRAM SELECTIVE EACH ADDITIONAL VESSEL  04/04/2019  . IR ANGIOGRAM SELECTIVE EACH ADDITIONAL VESSEL  04/04/2019  . IR ANGIOGRAM  SELECTIVE EACH ADDITIONAL VESSEL  04/04/2019  . IR ANGIOGRAM SELECTIVE EACH ADDITIONAL VESSEL  05/16/2019  . IR ANGIOGRAM SELECTIVE EACH ADDITIONAL VESSEL  05/16/2019  . IR ANGIOGRAM SELECTIVE EACH ADDITIONAL VESSEL  05/16/2019  . IR ANGIOGRAM SELECTIVE EACH ADDITIONAL VESSEL  02/14/2020  . IR ANGIOGRAM SELECTIVE EACH ADDITIONAL VESSEL  02/14/2020  . IR ANGIOGRAM SELECTIVE EACH ADDITIONAL VESSEL  02/14/2020  . IR ANGIOGRAM SELECTIVE EACH ADDITIONAL VESSEL  02/14/2020  . IR ANGIOGRAM VISCERAL SELECTIVE  04/04/2019  . IR ANGIOGRAM VISCERAL SELECTIVE  05/16/2019  . IR ANGIOGRAM VISCERAL SELECTIVE  02/14/2020  . IR ANGIOGRAM VISCERAL SELECTIVE  02/14/2020  . IR EMBO ARTERIAL NOT HEMORR HEMANG INC GUIDE ROADMAPPING  02/14/2020  . IR EMBO TUMOR ORGAN ISCHEMIA INFARCT INC GUIDE ROADMAPPING  04/04/2019  . IR EMBO TUMOR ORGAN ISCHEMIA INFARCT INC GUIDE ROADMAPPING  05/16/2019  . IR GENERIC HISTORICAL  02/26/2016   IR RADIOLOGIST EVAL & MGMT 02/26/2016 Markus Daft, MD [facility] INTERV RAD  . IR GENERIC HISTORICAL  01/16/2016   IR RADIOLOGIST EVAL & MGMT 01/16/2016 Markus Daft, MD [facility] INTERV RAD  . IR GENERIC HISTORICAL  06/24/2016   IR RADIOLOGIST EVAL & MGMT 06/24/2016 Aletta Edouard, MD [facility] INTERV RAD  . IR RADIOLOGIST EVAL & MGMT  12/17/2016  . IR RADIOLOGIST EVAL & MGMT  06/16/2017  . IR RADIOLOGIST EVAL & MGMT  09/08/2017  . IR RADIOLOGIST EVAL & MGMT  10/26/2017  . IR RADIOLOGIST EVAL & MGMT  05/12/2018  .  IR RADIOLOGIST EVAL & MGMT  07/26/2018  . IR RADIOLOGIST EVAL & MGMT  03/22/2019  . IR RADIOLOGIST EVAL & MGMT  04/11/2019  . IR RADIOLOGIST EVAL & MGMT  06/28/2019  . IR RADIOLOGIST EVAL & MGMT  10/10/2019  . IR US GUIDE VASC ACCESS LEFT  05/16/2019  . IR US GUIDE VASC ACCESS RIGHT  04/04/2019  . IR US GUIDE VASC ACCESS RIGHT  02/14/2020  . POLYPECTOMY    . RADIOFREQUENCY ABLATION N/A 07/21/2017   Procedure: CT MICROWAVE THERMAL ABLATION-LIVER;  Surgeon: Markus Daft, MD;  Location: [facility] ORS;  Service: Anesthesiology;   Laterality: N/A;  . RADIOFREQUENCY ABLATION N/A 06/29/2018   Procedure: CT MICROWAVE THERMAL ABLATION;  Surgeon: Markus Daft, MD;  Location: [facility] ORS;  Service: Anesthesiology;  Laterality: N/A;    Allergies: Patient has no known allergies.  Medications: Prior to Admission medications   Medication Sig Start Date End Date Taking? Authorizing Provider  ACCU-CHEK AVIVA PLUS test strip  11/19/19  Yes [provider]  amLODipine (NORVASC) 10 MG tablet Take 1 tablet (10 mg total) by mouth daily. 11/29/19  Yes Brunetta Genera, MD  aspirin EC 81 MG tablet Take 81 mg by mouth every morning.    Yes [provider]  cholecalciferol (VITAMIN D) 1000 UNITS tablet Take 1,000 Units by mouth every morning.    Yes [provider]  Insulin Glargine (LANTUS SOLOSTAR) 100 UNIT/ML Solostar Pen Inject 32 Units into the skin daily.   Yes [provider]  lisinopril-hydrochlorothiazide (ZESTORETIC) 20-12.5 MG tablet TAKE 2 TABLETS BY MOUTH DAILY WITH BREAKFAST. 02/12/20  Yes Brunetta Genera, MD  metFORMIN (GLUCOPHAGE) 1000 MG tablet Take 1,000 mg by mouth 2 (two) times daily with a meal.   Yes [provider]  Multiple Vitamin (MULTIVITAMIN WITH MINERALS) TABS tablet Take 1 tablet daily by mouth.   Yes [provider]  simvastatin (ZOCOR) 40 MG tablet Take 20 mg by mouth every evening.    Yes [provider]  Accu-Chek Softclix Lancets lancets  09/12/19   [provider]  ALPRAZolam Duanne Moron) 0.5 MG tablet Take 1 tablet (0.5 mg total) by mouth as directed. Take 2 tablets (1.0 mg total) prior to MR scan.  Make take an additional 0.5 mg po if needed. Patient not taking: Reported on 07/28/2019 10/26/17   Markus Daft, MD  omeprazole (PRILOSEC) 20 MG capsule Take 1 capsule (20 mg total) by mouth daily. 02/14/20 04/14/20  Candiss Norse A, PA-C  oxyCODONE (OXY IR/ROXICODONE) 5 MG immediate release tablet Take 1 tablet (5 mg total) by mouth every 6 (six)  hours as needed for up to 15 doses for moderate pain or severe pain. Patient not taking: Reported on 07/28/2019 06/30/18   Candiss Norse A, PA-C  PAZEO 0.7 % SOLN Place 1 drop into both eyes every morning.  05/09/18   [provider]     History reviewed. No pertinent family history.  Social History   Socioeconomic History  . Marital status: Married    Spouse name: Not on file  . Number of children: Not on file  . Years of education: Not on file  . Highest education level: Not on file  Occupational History  . Not on file  Tobacco Use  . Smoking status: Former Smoker    Quit date: 01/30/1994    Years since quitting: 26.1  . Smokeless tobacco: Never Used  . Tobacco comment: stopped 25 yrs ago  Vaping Use  . Vaping Use: Never  used  Substance and Sexual Activity  . Alcohol use: No    Comment: stopped 30 years ago   . Drug use: No  . Sexual activity: Not Currently  Other Topics Concern  . Not on file  Social History Narrative  . Not on file   Social Determinants of Health   Financial Resource Strain:   . Difficulty of Paying Living Expenses:   Food Insecurity:   . Worried About Charity fundraiser in the Last Year:   . Arboriculturist in the Last Year:   Transportation Needs:   . Film/video editor (Medical):   [name] Kitchen Lack of Transportation (Non-Medical):   Physical Activity:   . Days of Exercise per Week:   . Minutes of Exercise per Session:   Stress:   . Feeling of Stress :   Social Connections:   . Frequency of Communication with Friends and Family:   . Frequency of Social Gatherings with Friends and Family:   . Attends Religious Services:   . Active Member of Clubs or Organizations:   . Attends Archivist Meetings:   [name] Kitchen Marital Status:     Review of Systems: A 12 point ROS discussed and pertinent positives are indicated in the HPI above.  All other systems are negative.  Review of Systems  Constitutional: Negative for fever.  HENT:  Negative for congestion.   Respiratory: Negative for cough and shortness of breath.   Cardiovascular: Negative for chest pain.  Gastrointestinal: Negative for abdominal pain.  Neurological: Negative for headaches.  Psychiatric/Behavioral: Negative for behavioral problems and confusion.    Vital Signs: BP (!) 148/73   Pulse 78   Temp 97.9 F (36.6 C) (Oral)   Resp 17   Ht 6' (1.829 m)   Wt 246 lb (111.6 kg)   SpO2 100%   BMI 33.36 kg/m   Physical Exam Vitals and nursing note reviewed.  Constitutional:      Appearance: He is well-developed.  HENT:     Head: Normocephalic.  Pulmonary:     Effort: Pulmonary effort is normal.  Musculoskeletal:        General: Normal range of motion.     Cervical back: Normal range of motion.  Skin:    General: Skin is dry.  Neurological:     Mental Status: He is alert and oriented to person, place, and time.     Imaging: NM LIVER TUMOR LOC IMFLAM SPECT 1 DAY  Result Date: 02/14/2020 CLINICAL DATA:  Pre Y 90 therapy for unresectable multifocal hepatocellular carcinoma with evidence of disease progression on recent MRI despite systemic therapy. Previous liver directed therapy with percutaneous microwave ablation in catheter directed bland and chemoembolization. EXAM: NUCLEAR MEDICINE LIVER SCAN; ULTRASOUND MISCELLANEOUS SOFT TISSUE TECHNIQUE: Abdominal images were obtained in multiple projections after intrahepatic arterial injection of radiopharmaceutical. SPECT imaging was performed. Lung shunt calculation was performed. RADIOPHARMACEUTICALS:  4.49millicurie MAA TECHNETIUM TO 41M ALBUMIN AGGREGATED COMPARISON:  MRI 01/26/2020 FINDINGS: The injected microaggregated albumin localizes within the liver. No evidence of activity within the stomach, duodenum, or bowel. Calculated shunt fraction to the lungs equals 7.1%. IMPRESSION: 1. No significant extrahepatic radiotracer activity following intrahepatic arterial injection of MAA. 2. Lung shunt fraction  equals 7.1% Electronically Signed   By: Kerby Moors M.D.   On: 02/14/2020 20:20   IR Angiogram Visceral Selective  Result Date: 02/14/2020 INDICATION: 74 year old with multifocal hepatocellular carcinoma. Evidence for progressive disease on recent MRI despite systemic therapy. Plan for liver  directed therapy with Y 90 radioembolization. Previous liver directed therapy with percutaneous microwave ablation and catheter-directed bland and chemoembolization. EXAM: 1. Visceral angiography including common hepatic artery, gastroduodenal artery, right gastric artery, left hepatic artery and right hepatic artery 2. Coil embolization of gastroduodenal artery 3. Coil embolization of right gastric artery 4. Injection of technetium 77m MAA into bilateral hepatic arteries MEDICATIONS: None ANESTHESIA/SEDATION: Moderate (conscious) sedation was employed during this procedure. A total of Versed 6.0 mg and Fentanyl 200 mcg was administered intravenously. Moderate Sedation Time: 81 minutes. The patient's level of consciousness and vital signs were monitored continuously by radiology nursing throughout the procedure under my direct supervision. CONTRAST:  76 mL of Visipaque 320 FLUOROSCOPY TIME:  Fluoroscopy Time: 19 minutes, 6 seconds, 2426 mGy COMPLICATIONS: None immediate. PROCEDURE: Informed consent was obtained from the patient following explanation of the procedure, risks, benefits and alternatives. The patient understands, agrees and consents for the procedure. All questions were addressed. A time out was performed prior to the initiation of the procedure. Ultrasound confirmed a patent right common femoral artery. Right groin was prepped and draped in sterile fashion. Maximal barrier sterile technique was utilized including caps, mask, sterile gowns, sterile gloves, sterile drape, hand hygiene and skin antiseptic. Skin was anesthetized with 1% lidocaine. Small incision was made. Using ultrasound guidance, 21 gauge  needle was directed into the right common femoral artery and micropuncture dilator set was placed. Access was upsized to accommodate a 5 [place] vascular sheath. C2 catheter was used to cannulate the SMA and angiogram confirmed patency of the SMA. C2 catheter was used to cannulate the celiac trunk and the common hepatic artery. Selective angiography was performed in the common hepatic artery. Catheter was advanced into the GDA and selective angiography was performed. The gastroduodenal artery was coiled using a Progreat microcatheter and two 3 mm Ruby coils. Follow-up angiography was performed. Right gastric artery was successfully cannulated with microcatheter and wire. Selective angiography was performed. Proximal aspect of the right gastric artery was embolized using two 2 mm Ruby coils. Follow-up angiography was performed. Progreat catheter was advanced into the right hepatic artery and selective angiography was performed. Catheter was then placed in left hepatic artery and selective angiography was performed. Half of the MAA dose was injected through the left hepatic artery. The second half was injected through the microcatheter in the right hepatic artery. Microcatheter and C2 catheter removed. Final angiogram was performed through the right groin sheath. Right groin sheath was removed using ExoSeal closure device. Right groin hemostasis at the end of the procedure. FINDINGS: SMA: Patent. Common hepatic artery: Common hepatic artery, GDA, right gastric artery, left hepatic artery and right hepatic artery are patent. Delayed image demonstrates patency of the portal venous system. Mild central blushing in the central liver compatible with known hepatocellular carcinoma. The gastroduodenal artery was successfully embolized following the placement of 2 Ruby coils. Right gastric artery was successfully cannulated and embolized with two Ruby coils. Left and right hepatic arteries were selected and dedicated  angiography was performed. Liver tumors are relatively hypovascular and faintly identified on the selective angiography. IMPRESSION: 1. Successful mapping of the hepatic vasculature prior to Y90 radioembolization. Selective angiography was performed in the left and right hepatic arteries. Hepatic tumors are relatively hypovascular compared to the previous angiography examinations. 2. Successful injection of technetium 60m MAA into bilateral hepatic arteries. 3. Successful coil embolization of the gastroduodenal artery and right gastric artery. Electronically Signed   By: Markus Daft M.D.   On:  02/14/2020 12:55   IR Angiogram Visceral Selective  Result Date: 02/14/2020 INDICATION: 74 year old with multifocal hepatocellular carcinoma. Evidence for progressive disease on recent MRI despite systemic therapy. Plan for liver directed therapy with Y 90 radioembolization. Previous liver directed therapy with percutaneous microwave ablation and catheter-directed bland and chemoembolization. EXAM: 1. Visceral angiography including common hepatic artery, gastroduodenal artery, right gastric artery, left hepatic artery and right hepatic artery 2. Coil embolization of gastroduodenal artery 3. Coil embolization of right gastric artery 4. Injection of technetium 21m MAA into bilateral hepatic arteries MEDICATIONS: None ANESTHESIA/SEDATION: Moderate (conscious) sedation was employed during this procedure. A total of Versed 6.0 mg and Fentanyl 200 mcg was administered intravenously. Moderate Sedation Time: 81 minutes. The patient's level of consciousness and vital signs were monitored continuously by radiology nursing throughout the procedure under my direct supervision. CONTRAST:  76 mL of Visipaque 320 FLUOROSCOPY TIME:  Fluoroscopy Time: 19 minutes, 6 seconds, 9201 mGy COMPLICATIONS: None immediate. PROCEDURE: Informed consent was obtained from the patient following explanation of the procedure, risks, benefits and  alternatives. The patient understands, agrees and consents for the procedure. All questions were addressed. A time out was performed prior to the initiation of the procedure. Ultrasound confirmed a patent right common femoral artery. Right groin was prepped and draped in sterile fashion. Maximal barrier sterile technique was utilized including caps, mask, sterile gowns, sterile gloves, sterile drape, hand hygiene and skin antiseptic. Skin was anesthetized with 1% lidocaine. Small incision was made. Using ultrasound guidance, 21 gauge needle was directed into the right common femoral artery and micropuncture dilator set was placed. Access was upsized to accommodate a 5 [place] vascular sheath. C2 catheter was used to cannulate the SMA and angiogram confirmed patency of the SMA. C2 catheter was used to cannulate the celiac trunk and the common hepatic artery. Selective angiography was performed in the common hepatic artery. Catheter was advanced into the GDA and selective angiography was performed. The gastroduodenal artery was coiled using a Progreat microcatheter and two 3 mm Ruby coils. Follow-up angiography was performed. Right gastric artery was successfully cannulated with microcatheter and wire. Selective angiography was performed. Proximal aspect of the right gastric artery was embolized using two 2 mm Ruby coils. Follow-up angiography was performed. Progreat catheter was advanced into the right hepatic artery and selective angiography was performed. Catheter was then placed in left hepatic artery and selective angiography was performed. Half of the MAA dose was injected through the left hepatic artery. The second half was injected through the microcatheter in the right hepatic artery. Microcatheter and C2 catheter removed. Final angiogram was performed through the right groin sheath. Right groin sheath was removed using ExoSeal closure device. Right groin hemostasis at the end of the procedure. FINDINGS: SMA:  Patent. Common hepatic artery: Common hepatic artery, GDA, right gastric artery, left hepatic artery and right hepatic artery are patent. Delayed image demonstrates patency of the portal venous system. Mild central blushing in the central liver compatible with known hepatocellular carcinoma. The gastroduodenal artery was successfully embolized following the placement of 2 Ruby coils. Right gastric artery was successfully cannulated and embolized with two Ruby coils. Left and right hepatic arteries were selected and dedicated angiography was performed. Liver tumors are relatively hypovascular and faintly identified on the selective angiography. IMPRESSION: 1. Successful mapping of the hepatic vasculature prior to Y90 radioembolization. Selective angiography was performed in the left and right hepatic arteries. Hepatic tumors are relatively hypovascular compared to the previous angiography examinations. 2. Successful injection of  technetium 106m MAA into bilateral hepatic arteries. 3. Successful coil embolization of the gastroduodenal artery and right gastric artery. Electronically Signed   By: Markus Daft M.D.   On: 02/14/2020 12:55   IR Angiogram Selective Each Additional Vessel  Result Date: 02/14/2020 INDICATION: 74 year old with multifocal hepatocellular carcinoma. Evidence for progressive disease on recent MRI despite systemic therapy. Plan for liver directed therapy with Y 90 radioembolization. Previous liver directed therapy with percutaneous microwave ablation and catheter-directed bland and chemoembolization. EXAM: 1. Visceral angiography including common hepatic artery, gastroduodenal artery, right gastric artery, left hepatic artery and right hepatic artery 2. Coil embolization of gastroduodenal artery 3. Coil embolization of right gastric artery 4. Injection of technetium 30m MAA into bilateral hepatic arteries MEDICATIONS: None ANESTHESIA/SEDATION: Moderate (conscious) sedation was employed during this  procedure. A total of Versed 6.0 mg and Fentanyl 200 mcg was administered intravenously. Moderate Sedation Time: 81 minutes. The patient's level of consciousness and vital signs were monitored continuously by radiology nursing throughout the procedure under my direct supervision. CONTRAST:  76 mL of Visipaque 320 FLUOROSCOPY TIME:  Fluoroscopy Time: 19 minutes, 6 seconds, 7939 mGy COMPLICATIONS: None immediate. PROCEDURE: Informed consent was obtained from the patient following explanation of the procedure, risks, benefits and alternatives. The patient understands, agrees and consents for the procedure. All questions were addressed. A time out was performed prior to the initiation of the procedure. Ultrasound confirmed a patent right common femoral artery. Right groin was prepped and draped in sterile fashion. Maximal barrier sterile technique was utilized including caps, mask, sterile gowns, sterile gloves, sterile drape, hand hygiene and skin antiseptic. Skin was anesthetized with 1% lidocaine. Small incision was made. Using ultrasound guidance, 21 gauge needle was directed into the right common femoral artery and micropuncture dilator set was placed. Access was upsized to accommodate a 5 [place] vascular sheath. C2 catheter was used to cannulate the SMA and angiogram confirmed patency of the SMA. C2 catheter was used to cannulate the celiac trunk and the common hepatic artery. Selective angiography was performed in the common hepatic artery. Catheter was advanced into the GDA and selective angiography was performed. The gastroduodenal artery was coiled using a Progreat microcatheter and two 3 mm Ruby coils. Follow-up angiography was performed. Right gastric artery was successfully cannulated with microcatheter and wire. Selective angiography was performed. Proximal aspect of the right gastric artery was embolized using two 2 mm Ruby coils. Follow-up angiography was performed. Progreat catheter was advanced into  the right hepatic artery and selective angiography was performed. Catheter was then placed in left hepatic artery and selective angiography was performed. Half of the MAA dose was injected through the left hepatic artery. The second half was injected through the microcatheter in the right hepatic artery. Microcatheter and C2 catheter removed. Final angiogram was performed through the right groin sheath. Right groin sheath was removed using ExoSeal closure device. Right groin hemostasis at the end of the procedure. FINDINGS: SMA: Patent. Common hepatic artery: Common hepatic artery, GDA, right gastric artery, left hepatic artery and right hepatic artery are patent. Delayed image demonstrates patency of the portal venous system. Mild central blushing in the central liver compatible with known hepatocellular carcinoma. The gastroduodenal artery was successfully embolized following the placement of 2 Ruby coils. Right gastric artery was successfully cannulated and embolized with two Ruby coils. Left and right hepatic arteries were selected and dedicated angiography was performed. Liver tumors are relatively hypovascular and faintly identified on the selective angiography. IMPRESSION: 1. Successful mapping of  the hepatic vasculature prior to Y90 radioembolization. Selective angiography was performed in the left and right hepatic arteries. Hepatic tumors are relatively hypovascular compared to the previous angiography examinations. 2. Successful injection of technetium 91m MAA into bilateral hepatic arteries. 3. Successful coil embolization of the gastroduodenal artery and right gastric artery. Electronically Signed   By: Markus Daft M.D.   On: 02/14/2020 12:55   IR Angiogram Selective Each Additional Vessel  Result Date: 02/14/2020 INDICATION: 73 year old with multifocal hepatocellular carcinoma. Evidence for progressive disease on recent MRI despite systemic therapy. Plan for liver directed therapy with Y 90  radioembolization. Previous liver directed therapy with percutaneous microwave ablation and catheter-directed bland and chemoembolization. EXAM: 1. Visceral angiography including common hepatic artery, gastroduodenal artery, right gastric artery, left hepatic artery and right hepatic artery 2. Coil embolization of gastroduodenal artery 3. Coil embolization of right gastric artery 4. Injection of technetium 50m MAA into bilateral hepatic arteries MEDICATIONS: None ANESTHESIA/SEDATION: Moderate (conscious) sedation was employed during this procedure. A total of Versed 6.0 mg and Fentanyl 200 mcg was administered intravenously. Moderate Sedation Time: 81 minutes. The patient's level of consciousness and vital signs were monitored continuously by radiology nursing throughout the procedure under my direct supervision. CONTRAST:  76 mL of Visipaque 320 FLUOROSCOPY TIME:  Fluoroscopy Time: 19 minutes, 6 seconds, 4098 mGy COMPLICATIONS: None immediate. PROCEDURE: Informed consent was obtained from the patient following explanation of the procedure, risks, benefits and alternatives. The patient understands, agrees and consents for the procedure. All questions were addressed. A time out was performed prior to the initiation of the procedure. Ultrasound confirmed a patent right common femoral artery. Right groin was prepped and draped in sterile fashion. Maximal barrier sterile technique was utilized including caps, mask, sterile gowns, sterile gloves, sterile drape, hand hygiene and skin antiseptic. Skin was anesthetized with 1% lidocaine. Small incision was made. Using ultrasound guidance, 21 gauge needle was directed into the right common femoral artery and micropuncture dilator set was placed. Access was upsized to accommodate a 5 [place] vascular sheath. C2 catheter was used to cannulate the SMA and angiogram confirmed patency of the SMA. C2 catheter was used to cannulate the celiac trunk and the common hepatic artery.  Selective angiography was performed in the common hepatic artery. Catheter was advanced into the GDA and selective angiography was performed. The gastroduodenal artery was coiled using a Progreat microcatheter and two 3 mm Ruby coils. Follow-up angiography was performed. Right gastric artery was successfully cannulated with microcatheter and wire. Selective angiography was performed. Proximal aspect of the right gastric artery was embolized using two 2 mm Ruby coils. Follow-up angiography was performed. Progreat catheter was advanced into the right hepatic artery and selective angiography was performed. Catheter was then placed in left hepatic artery and selective angiography was performed. Half of the MAA dose was injected through the left hepatic artery. The second half was injected through the microcatheter in the right hepatic artery. Microcatheter and C2 catheter removed. Final angiogram was performed through the right groin sheath. Right groin sheath was removed using ExoSeal closure device. Right groin hemostasis at the end of the procedure. FINDINGS: SMA: Patent. Common hepatic artery: Common hepatic artery, GDA, right gastric artery, left hepatic artery and right hepatic artery are patent. Delayed image demonstrates patency of the portal venous system. Mild central blushing in the central liver compatible with known hepatocellular carcinoma. The gastroduodenal artery was successfully embolized following the placement of 2 Ruby coils. Right gastric artery was successfully cannulated and embolized  with two Ruby coils. Left and right hepatic arteries were selected and dedicated angiography was performed. Liver tumors are relatively hypovascular and faintly identified on the selective angiography. IMPRESSION: 1. Successful mapping of the hepatic vasculature prior to Y90 radioembolization. Selective angiography was performed in the left and right hepatic arteries. Hepatic tumors are relatively hypovascular  compared to the previous angiography examinations. 2. Successful injection of technetium 49m MAA into bilateral hepatic arteries. 3. Successful coil embolization of the gastroduodenal artery and right gastric artery. Electronically Signed   By: Markus Daft M.D.   On: 02/14/2020 12:55   IR Angiogram Selective Each Additional Vessel  Result Date: 02/14/2020 INDICATION: 74 year old with multifocal hepatocellular carcinoma. Evidence for progressive disease on recent MRI despite systemic therapy. Plan for liver directed therapy with Y 90 radioembolization. Previous liver directed therapy with percutaneous microwave ablation and catheter-directed bland and chemoembolization. EXAM: 1. Visceral angiography including common hepatic artery, gastroduodenal artery, right gastric artery, left hepatic artery and right hepatic artery 2. Coil embolization of gastroduodenal artery 3. Coil embolization of right gastric artery 4. Injection of technetium 49m MAA into bilateral hepatic arteries MEDICATIONS: None ANESTHESIA/SEDATION: Moderate (conscious) sedation was employed during this procedure. A total of Versed 6.0 mg and Fentanyl 200 mcg was administered intravenously. Moderate Sedation Time: 81 minutes. The patient's level of consciousness and vital signs were monitored continuously by radiology nursing throughout the procedure under my direct supervision. CONTRAST:  76 mL of Visipaque 320 FLUOROSCOPY TIME:  Fluoroscopy Time: 19 minutes, 6 seconds, 0923 mGy COMPLICATIONS: None immediate. PROCEDURE: Informed consent was obtained from the patient following explanation of the procedure, risks, benefits and alternatives. The patient understands, agrees and consents for the procedure. All questions were addressed. A time out was performed prior to the initiation of the procedure. Ultrasound confirmed a patent right common femoral artery. Right groin was prepped and draped in sterile fashion. Maximal barrier sterile technique was  utilized including caps, mask, sterile gowns, sterile gloves, sterile drape, hand hygiene and skin antiseptic. Skin was anesthetized with 1% lidocaine. Small incision was made. Using ultrasound guidance, 21 gauge needle was directed into the right common femoral artery and micropuncture dilator set was placed. Access was upsized to accommodate a 5 [place] vascular sheath. C2 catheter was used to cannulate the SMA and angiogram confirmed patency of the SMA. C2 catheter was used to cannulate the celiac trunk and the common hepatic artery. Selective angiography was performed in the common hepatic artery. Catheter was advanced into the GDA and selective angiography was performed. The gastroduodenal artery was coiled using a Progreat microcatheter and two 3 mm Ruby coils. Follow-up angiography was performed. Right gastric artery was successfully cannulated with microcatheter and wire. Selective angiography was performed. Proximal aspect of the right gastric artery was embolized using two 2 mm Ruby coils. Follow-up angiography was performed. Progreat catheter was advanced into the right hepatic artery and selective angiography was performed. Catheter was then placed in left hepatic artery and selective angiography was performed. Half of the MAA dose was injected through the left hepatic artery. The second half was injected through the microcatheter in the right hepatic artery. Microcatheter and C2 catheter removed. Final angiogram was performed through the right groin sheath. Right groin sheath was removed using ExoSeal closure device. Right groin hemostasis at the end of the procedure. FINDINGS: SMA: Patent. Common hepatic artery: Common hepatic artery, GDA, right gastric artery, left hepatic artery and right hepatic artery are patent. Delayed image demonstrates patency of the portal venous system.  Mild central blushing in the central liver compatible with known hepatocellular carcinoma. The gastroduodenal artery was  successfully embolized following the placement of 2 Ruby coils. Right gastric artery was successfully cannulated and embolized with two Ruby coils. Left and right hepatic arteries were selected and dedicated angiography was performed. Liver tumors are relatively hypovascular and faintly identified on the selective angiography. IMPRESSION: 1. Successful mapping of the hepatic vasculature prior to Y90 radioembolization. Selective angiography was performed in the left and right hepatic arteries. Hepatic tumors are relatively hypovascular compared to the previous angiography examinations. 2. Successful injection of technetium 19m MAA into bilateral hepatic arteries. 3. Successful coil embolization of the gastroduodenal artery and right gastric artery. Electronically Signed   By: Markus Daft M.D.   On: 02/14/2020 12:55   IR Angiogram Selective Each Additional Vessel  Result Date: 02/14/2020 INDICATION: 74 year old with multifocal hepatocellular carcinoma. Evidence for progressive disease on recent MRI despite systemic therapy. Plan for liver directed therapy with Y 90 radioembolization. Previous liver directed therapy with percutaneous microwave ablation and catheter-directed bland and chemoembolization. EXAM: 1. Visceral angiography including common hepatic artery, gastroduodenal artery, right gastric artery, left hepatic artery and right hepatic artery 2. Coil embolization of gastroduodenal artery 3. Coil embolization of right gastric artery 4. Injection of technetium 97m MAA into bilateral hepatic arteries MEDICATIONS: None ANESTHESIA/SEDATION: Moderate (conscious) sedation was employed during this procedure. A total of Versed 6.0 mg and Fentanyl 200 mcg was administered intravenously. Moderate Sedation Time: 81 minutes. The patient's level of consciousness and vital signs were monitored continuously by radiology nursing throughout the procedure under my direct supervision. CONTRAST:  76 mL of Visipaque 320  FLUOROSCOPY TIME:  Fluoroscopy Time: 19 minutes, 6 seconds, 1093 mGy COMPLICATIONS: None immediate. PROCEDURE: Informed consent was obtained from the patient following explanation of the procedure, risks, benefits and alternatives. The patient understands, agrees and consents for the procedure. All questions were addressed. A time out was performed prior to the initiation of the procedure. Ultrasound confirmed a patent right common femoral artery. Right groin was prepped and draped in sterile fashion. Maximal barrier sterile technique was utilized including caps, mask, sterile gowns, sterile gloves, sterile drape, hand hygiene and skin antiseptic. Skin was anesthetized with 1% lidocaine. Small incision was made. Using ultrasound guidance, 21 gauge needle was directed into the right common femoral artery and micropuncture dilator set was placed. Access was upsized to accommodate a 5 [place] vascular sheath. C2 catheter was used to cannulate the SMA and angiogram confirmed patency of the SMA. C2 catheter was used to cannulate the celiac trunk and the common hepatic artery. Selective angiography was performed in the common hepatic artery. Catheter was advanced into the GDA and selective angiography was performed. The gastroduodenal artery was coiled using a Progreat microcatheter and two 3 mm Ruby coils. Follow-up angiography was performed. Right gastric artery was successfully cannulated with microcatheter and wire. Selective angiography was performed. Proximal aspect of the right gastric artery was embolized using two 2 mm Ruby coils. Follow-up angiography was performed. Progreat catheter was advanced into the right hepatic artery and selective angiography was performed. Catheter was then placed in left hepatic artery and selective angiography was performed. Half of the MAA dose was injected through the left hepatic artery. The second half was injected through the microcatheter in the right hepatic artery.  Microcatheter and C2 catheter removed. Final angiogram was performed through the right groin sheath. Right groin sheath was removed using ExoSeal closure device. Right groin hemostasis at the end  of the procedure. FINDINGS: SMA: Patent. Common hepatic artery: Common hepatic artery, GDA, right gastric artery, left hepatic artery and right hepatic artery are patent. Delayed image demonstrates patency of the portal venous system. Mild central blushing in the central liver compatible with known hepatocellular carcinoma. The gastroduodenal artery was successfully embolized following the placement of 2 Ruby coils. Right gastric artery was successfully cannulated and embolized with two Ruby coils. Left and right hepatic arteries were selected and dedicated angiography was performed. Liver tumors are relatively hypovascular and faintly identified on the selective angiography. IMPRESSION: 1. Successful mapping of the hepatic vasculature prior to Y90 radioembolization. Selective angiography was performed in the left and right hepatic arteries. Hepatic tumors are relatively hypovascular compared to the previous angiography examinations. 2. Successful injection of technetium 84m MAA into bilateral hepatic arteries. 3. Successful coil embolization of the gastroduodenal artery and right gastric artery. Electronically Signed   By: Markus Daft M.D.   On: 02/14/2020 12:55   IR US Guide Vasc Access Right  Result Date: 02/14/2020 INDICATION: 75 year old with multifocal hepatocellular carcinoma. Evidence for progressive disease on recent MRI despite systemic therapy. Plan for liver directed therapy with Y 90 radioembolization. Previous liver directed therapy with percutaneous microwave ablation and catheter-directed bland and chemoembolization. EXAM: 1. Visceral angiography including common hepatic artery, gastroduodenal artery, right gastric artery, left hepatic artery and right hepatic artery 2. Coil embolization of gastroduodenal  artery 3. Coil embolization of right gastric artery 4. Injection of technetium 15m MAA into bilateral hepatic arteries MEDICATIONS: None ANESTHESIA/SEDATION: Moderate (conscious) sedation was employed during this procedure. A total of Versed 6.0 mg and Fentanyl 200 mcg was administered intravenously. Moderate Sedation Time: 81 minutes. The patient's level of consciousness and vital signs were monitored continuously by radiology nursing throughout the procedure under my direct supervision. CONTRAST:  76 mL of Visipaque 320 FLUOROSCOPY TIME:  Fluoroscopy Time: 19 minutes, 6 seconds, 7106 mGy COMPLICATIONS: None immediate. PROCEDURE: Informed consent was obtained from the patient following explanation of the procedure, risks, benefits and alternatives. The patient understands, agrees and consents for the procedure. All questions were addressed. A time out was performed prior to the initiation of the procedure. Ultrasound confirmed a patent right common femoral artery. Right groin was prepped and draped in sterile fashion. Maximal barrier sterile technique was utilized including caps, mask, sterile gowns, sterile gloves, sterile drape, hand hygiene and skin antiseptic. Skin was anesthetized with 1% lidocaine. Small incision was made. Using ultrasound guidance, 21 gauge needle was directed into the right common femoral artery and micropuncture dilator set was placed. Access was upsized to accommodate a 5 [place] vascular sheath. C2 catheter was used to cannulate the SMA and angiogram confirmed patency of the SMA. C2 catheter was used to cannulate the celiac trunk and the common hepatic artery. Selective angiography was performed in the common hepatic artery. Catheter was advanced into the GDA and selective angiography was performed. The gastroduodenal artery was coiled using a Progreat microcatheter and two 3 mm Ruby coils. Follow-up angiography was performed. Right gastric artery was successfully cannulated with  microcatheter and wire. Selective angiography was performed. Proximal aspect of the right gastric artery was embolized using two 2 mm Ruby coils. Follow-up angiography was performed. Progreat catheter was advanced into the right hepatic artery and selective angiography was performed. Catheter was then placed in left hepatic artery and selective angiography was performed. Half of the MAA dose was injected through the left hepatic artery. The second half was injected through the microcatheter in  the right hepatic artery. Microcatheter and C2 catheter removed. Final angiogram was performed through the right groin sheath. Right groin sheath was removed using ExoSeal closure device. Right groin hemostasis at the end of the procedure. FINDINGS: SMA: Patent. Common hepatic artery: Common hepatic artery, GDA, right gastric artery, left hepatic artery and right hepatic artery are patent. Delayed image demonstrates patency of the portal venous system. Mild central blushing in the central liver compatible with known hepatocellular carcinoma. The gastroduodenal artery was successfully embolized following the placement of 2 Ruby coils. Right gastric artery was successfully cannulated and embolized with two Ruby coils. Left and right hepatic arteries were selected and dedicated angiography was performed. Liver tumors are relatively hypovascular and faintly identified on the selective angiography. IMPRESSION: 1. Successful mapping of the hepatic vasculature prior to Y90 radioembolization. Selective angiography was performed in the left and right hepatic arteries. Hepatic tumors are relatively hypovascular compared to the previous angiography examinations. 2. Successful injection of technetium 30m MAA into bilateral hepatic arteries. 3. Successful coil embolization of the gastroduodenal artery and right gastric artery. Electronically Signed   By: Markus Daft M.D.   On: 02/14/2020 12:55   IR EMBO ARTERIAL NOT HEMORR HEMANG INC  GUIDE ROADMAPPING  Result Date: 02/14/2020 INDICATION: 74 year old with multifocal hepatocellular carcinoma. Evidence for progressive disease on recent MRI despite systemic therapy. Plan for liver directed therapy with Y 90 radioembolization. Previous liver directed therapy with percutaneous microwave ablation and catheter-directed bland and chemoembolization. EXAM: 1. Visceral angiography including common hepatic artery, gastroduodenal artery, right gastric artery, left hepatic artery and right hepatic artery 2. Coil embolization of gastroduodenal artery 3. Coil embolization of right gastric artery 4. Injection of technetium 75m MAA into bilateral hepatic arteries MEDICATIONS: None ANESTHESIA/SEDATION: Moderate (conscious) sedation was employed during this procedure. A total of Versed 6.0 mg and Fentanyl 200 mcg was administered intravenously. Moderate Sedation Time: 81 minutes. The patient's level of consciousness and vital signs were monitored continuously by radiology nursing throughout the procedure under my direct supervision. CONTRAST:  76 mL of Visipaque 320 FLUOROSCOPY TIME:  Fluoroscopy Time: 19 minutes, 6 seconds, 9371 mGy COMPLICATIONS: None immediate. PROCEDURE: Informed consent was obtained from the patient following explanation of the procedure, risks, benefits and alternatives. The patient understands, agrees and consents for the procedure. All questions were addressed. A time out was performed prior to the initiation of the procedure. Ultrasound confirmed a patent right common femoral artery. Right groin was prepped and draped in sterile fashion. Maximal barrier sterile technique was utilized including caps, mask, sterile gowns, sterile gloves, sterile drape, hand hygiene and skin antiseptic. Skin was anesthetized with 1% lidocaine. Small incision was made. Using ultrasound guidance, 21 gauge needle was directed into the right common femoral artery and micropuncture dilator set was placed. Access  was upsized to accommodate a 5 [place] vascular sheath. C2 catheter was used to cannulate the SMA and angiogram confirmed patency of the SMA. C2 catheter was used to cannulate the celiac trunk and the common hepatic artery. Selective angiography was performed in the common hepatic artery. Catheter was advanced into the GDA and selective angiography was performed. The gastroduodenal artery was coiled using a Progreat microcatheter and two 3 mm Ruby coils. Follow-up angiography was performed. Right gastric artery was successfully cannulated with microcatheter and wire. Selective angiography was performed. Proximal aspect of the right gastric artery was embolized using two 2 mm Ruby coils. Follow-up angiography was performed. Progreat catheter was advanced into the right hepatic artery and selective angiography  was performed. Catheter was then placed in left hepatic artery and selective angiography was performed. Half of the MAA dose was injected through the left hepatic artery. The second half was injected through the microcatheter in the right hepatic artery. Microcatheter and C2 catheter removed. Final angiogram was performed through the right groin sheath. Right groin sheath was removed using ExoSeal closure device. Right groin hemostasis at the end of the procedure. FINDINGS: SMA: Patent. Common hepatic artery: Common hepatic artery, GDA, right gastric artery, left hepatic artery and right hepatic artery are patent. Delayed image demonstrates patency of the portal venous system. Mild central blushing in the central liver compatible with known hepatocellular carcinoma. The gastroduodenal artery was successfully embolized following the placement of 2 Ruby coils. Right gastric artery was successfully cannulated and embolized with two Ruby coils. Left and right hepatic arteries were selected and dedicated angiography was performed. Liver tumors are relatively hypovascular and faintly identified on the selective  angiography. IMPRESSION: 1. Successful mapping of the hepatic vasculature prior to Y90 radioembolization. Selective angiography was performed in the left and right hepatic arteries. Hepatic tumors are relatively hypovascular compared to the previous angiography examinations. 2. Successful injection of technetium 82m MAA into bilateral hepatic arteries. 3. Successful coil embolization of the gastroduodenal artery and right gastric artery. Electronically Signed   By: Markus Daft M.D.   On: 02/14/2020 12:55   NM FUSION  Result Date: 02/14/2020 CLINICAL DATA:  Pre Y 90 therapy for unresectable multifocal hepatocellular carcinoma with evidence of disease progression on recent MRI despite systemic therapy. Previous liver directed therapy with percutaneous microwave ablation in catheter directed bland and chemoembolization. EXAM: NUCLEAR MEDICINE LIVER SCAN; ULTRASOUND MISCELLANEOUS SOFT TISSUE TECHNIQUE: Abdominal images were obtained in multiple projections after intrahepatic arterial injection of radiopharmaceutical. SPECT imaging was performed. Lung shunt calculation was performed. RADIOPHARMACEUTICALS:  4.66millicurie MAA TECHNETIUM TO 52M ALBUMIN AGGREGATED COMPARISON:  MRI 01/26/2020 FINDINGS: The injected microaggregated albumin localizes within the liver. No evidence of activity within the stomach, duodenum, or bowel. Calculated shunt fraction to the lungs equals 7.1%. IMPRESSION: 1. No significant extrahepatic radiotracer activity following intrahepatic arterial injection of MAA. 2. Lung shunt fraction equals 7.1% Electronically Signed   By: Kerby Moors M.D.   On: 02/14/2020 20:20    Labs:  CBC: Recent Labs    12/27/19 0848 01/17/20 1419 02/14/20 0855 03/01/20 0825  WBC 5.1 5.4 5.4 7.4  HGB 12.6* 13.2 13.8 12.9*  HCT 41.6 43.5 46.1 42.4  PLT 256 281 244 428*    COAGS: Recent Labs    03/30/19 0741 04/04/19 1235 05/16/19 0800 02/14/20 0855  INR 0.9 0.9 1.0 0.9  APTT  --   --   --  30     BMP: Recent Labs    12/27/19 0848 01/17/20 1419 02/14/20 0855 02/16/20 0825  NA 139 140 138 138  K 4.7 4.8 5.4* 5.0  CL 105 102 103 101  CO2 25 26 22 28   GLUCOSE 157* 97 166* 159*  BUN 24* 33* 33* 25*  CALCIUM 9.8 10.4* 9.0 9.3  CREATININE 1.99* 1.92* 1.78* 1.63*  GFRNONAA 32* 34* 37* 41*  GFRAA 37* 39* 43* 48*    LIVER FUNCTION TESTS: Recent Labs    12/06/19 1342 12/27/19 0848 01/17/20 1419 02/14/20 0855  BILITOT 0.6 0.5 0.7 0.6  AST 38 34 42* 47*  ALT 23 19 20 21   ALKPHOS 46 43 41 39  PROT 8.5* 8.3* 8.7* 8.3*  ALBUMIN 4.4 4.1 4.3 4.5   Assessment  and Plan: 74 y.o, male outpatient. Familiar to the IR service. History of DM, HLD, HTN, hepatitis C, hepatocellular carcinoma. IR performed a TACE on 7.21.15, 8.11.20, and 9.22.20, and  thermal ablation on 10.9.15, 6.19.17, 11.28.18, 11.6.19. Patient was last seen in IR for a Y90 performed on 6.23.21 with embolization of the gastroduodenal artery and the right gastric artery. Patient presents today for Y90 with embolization and possible additional coil embolization.    WBC is 7.4, Cr is 1.65, BUN 31, AST and ALT are WNL and all other labs and are within acceptable parameters.  Patient is not on anticoagulation. Patient is afebrile.   Risks and benefits discussed with the patient including, but not limited to bleeding, infection, vascular injury, post procedural pain, nausea, vomiting and fatigue, contrast induced renal failure, liver failure, radiation injury to the bowel, radiation induced cholecystitis, neutropenia and possible need for additional procedures.  All of the patient's questions were answered, patient is agreeable to proceed. Consent signed and in chart.   Thank you for this interesting consult.  I greatly enjoyed meeting Jerimiah Wolman and look forward to participating in their care.  A copy of this report was sent to the requesting provider on this date.  Electronically Signed: Avel Peace, NP 03/01/2020, 8:44 AM   I spent a total of    25 Minutes in face to face in clinical consultation, greater than 50% of which was counseling/coordinating care for Y90 with possible embolization.

## 2020-03-02 LAB — CEA: CEA: 2.3 ng/mL (ref 0.0–4.7)

## 2020-03-02 LAB — AFP TUMOR MARKER: AFP, Serum, Tumor Marker: 259 ng/mL — ABNORMAL HIGH (ref 0.0–8.3)

## 2020-03-07 ENCOUNTER — Other Ambulatory Visit: Payer: Self-pay | Admitting: Hematology

## 2020-03-08 ENCOUNTER — Other Ambulatory Visit: Payer: Self-pay | Admitting: Diagnostic Radiology

## 2020-03-08 DIAGNOSIS — C220 Liver cell carcinoma: Secondary | ICD-10-CM

## 2020-03-22 ENCOUNTER — Ambulatory Visit (HOSPITAL_COMMUNITY)
Admission: RE | Admit: 2020-03-22 | Discharge: 2020-03-22 | Disposition: A | Discharge status: Home / Self Care | Payer: Medicare HMO | Source: Ambulatory Visit | Attending: Diagnostic Radiology | Admitting: Diagnostic Radiology

## 2020-03-22 ENCOUNTER — Other Ambulatory Visit: Payer: Self-pay

## 2020-03-22 DIAGNOSIS — K7689 Other specified diseases of liver: Secondary | ICD-10-CM | POA: Diagnosis not present

## 2020-03-22 DIAGNOSIS — I700 Atherosclerosis of aorta: Secondary | ICD-10-CM | POA: Diagnosis not present

## 2020-03-22 DIAGNOSIS — Z01818 Encounter for other preprocedural examination: Secondary | ICD-10-CM | POA: Diagnosis not present

## 2020-03-22 DIAGNOSIS — C220 Liver cell carcinoma: Secondary | ICD-10-CM | POA: Diagnosis not present

## 2020-03-22 DIAGNOSIS — C229 Malignant neoplasm of liver, not specified as primary or secondary: Secondary | ICD-10-CM | POA: Diagnosis not present

## 2020-03-22 MED ORDER — SODIUM CHLORIDE (PF) 0.9 % IJ SOLN
INTRAMUSCULAR | Status: AC
  Filled 2020-03-22: qty 50

## 2020-03-22 MED ORDER — IOHEXOL 350 MG/ML SOLN
100.0000 mL | Freq: Once | INTRAVENOUS | Status: AC | PRN
  Administered 2020-03-22: 100 mL via INTRAVENOUS

## 2020-03-25 ENCOUNTER — Telehealth: Payer: Self-pay | Admitting: Physician Assistant

## 2020-03-25 NOTE — Telephone Encounter (Signed)
Spoke with patient via phone today to discuss how patient tolerated previous Y90 as well as discuss upcoming Y90 treatment on 8/9.  Patient reports feeling very fatigued and weaker than usual for 2-3 weeks post procedure as well as decreased appetite for 1-2 weeks. He was drinking ensure and eating small meals during that time. No pain, n/v - did note diarrhea when drinking ensure. He feels much better now and feels that he is ready to proceed with next Y90 treatment.  He denies any questions or concerns. We reviewed arrival time and NPO directions as well. Patient instructed to call IR should he have any questions or concerns.  Plan to proceed with Y90 treatment on 04/01/20 @ 0900 at [facility] with Dr. Anselm Pancoast as planned.  Candiss Norse, PA-C

## 2020-04-01 ENCOUNTER — Other Ambulatory Visit (HOSPITAL_COMMUNITY): Payer: Medicare HMO

## 2020-04-01 ENCOUNTER — Encounter (HOSPITAL_COMMUNITY): Payer: Self-pay

## 2020-04-01 ENCOUNTER — Ambulatory Visit (HOSPITAL_COMMUNITY): Payer: Medicare HMO

## 2020-04-01 ENCOUNTER — Encounter (HOSPITAL_COMMUNITY): Payer: Medicare HMO

## 2020-04-01 ENCOUNTER — Encounter (HOSPITAL_COMMUNITY): Admission: RE | Admit: 2020-04-01 | Payer: Medicare HMO | Source: Ambulatory Visit

## 2020-04-02 ENCOUNTER — Other Ambulatory Visit (HOSPITAL_COMMUNITY): Payer: Medicare HMO

## 2020-04-02 ENCOUNTER — Ambulatory Visit (HOSPITAL_COMMUNITY): Payer: Medicare HMO

## 2020-04-04 ENCOUNTER — Other Ambulatory Visit: Payer: Self-pay

## 2020-04-04 ENCOUNTER — Other Ambulatory Visit: Payer: Self-pay | Admitting: Diagnostic Radiology

## 2020-04-04 DIAGNOSIS — C220 Liver cell carcinoma: Secondary | ICD-10-CM

## 2020-04-09 ENCOUNTER — Other Ambulatory Visit: Payer: Self-pay | Admitting: Diagnostic Radiology

## 2020-04-09 DIAGNOSIS — C220 Liver cell carcinoma: Secondary | ICD-10-CM

## 2020-04-11 DIAGNOSIS — E119 Type 2 diabetes mellitus without complications: Secondary | ICD-10-CM | POA: Diagnosis not present

## 2020-04-11 DIAGNOSIS — I10 Essential (primary) hypertension: Secondary | ICD-10-CM | POA: Diagnosis not present

## 2020-04-11 DIAGNOSIS — Z125 Encounter for screening for malignant neoplasm of prostate: Secondary | ICD-10-CM | POA: Diagnosis not present

## 2020-04-11 DIAGNOSIS — Z0000 Encounter for general adult medical examination without abnormal findings: Secondary | ICD-10-CM | POA: Diagnosis not present

## 2020-04-11 DIAGNOSIS — E7800 Pure hypercholesterolemia, unspecified: Secondary | ICD-10-CM | POA: Diagnosis not present

## 2020-04-18 DIAGNOSIS — Z0000 Encounter for general adult medical examination without abnormal findings: Secondary | ICD-10-CM | POA: Diagnosis not present

## 2020-04-18 DIAGNOSIS — D472 Monoclonal gammopathy: Secondary | ICD-10-CM | POA: Diagnosis not present

## 2020-04-18 DIAGNOSIS — M25512 Pain in left shoulder: Secondary | ICD-10-CM | POA: Diagnosis not present

## 2020-04-18 DIAGNOSIS — E7800 Pure hypercholesterolemia, unspecified: Secondary | ICD-10-CM | POA: Diagnosis not present

## 2020-04-18 DIAGNOSIS — E119 Type 2 diabetes mellitus without complications: Secondary | ICD-10-CM | POA: Diagnosis not present

## 2020-04-18 DIAGNOSIS — C229 Malignant neoplasm of liver, not specified as primary or secondary: Secondary | ICD-10-CM | POA: Diagnosis not present

## 2020-04-18 DIAGNOSIS — I10 Essential (primary) hypertension: Secondary | ICD-10-CM | POA: Diagnosis not present

## 2020-04-19 ENCOUNTER — Other Ambulatory Visit (HOSPITAL_COMMUNITY)
Admission: RE | Admit: 2020-04-19 | Discharge: 2020-04-19 | Disposition: A | Discharge status: Home / Self Care | Payer: Medicare HMO | Source: Ambulatory Visit | Attending: Diagnostic Radiology | Admitting: Diagnostic Radiology

## 2020-04-19 ENCOUNTER — Other Ambulatory Visit: Payer: Self-pay | Admitting: Hematology

## 2020-04-19 DIAGNOSIS — C220 Liver cell carcinoma: Secondary | ICD-10-CM | POA: Diagnosis not present

## 2020-04-19 LAB — CBC
HCT: 36.7 % — ABNORMAL LOW (ref 39.0–52.0)
Hemoglobin: 11.1 g/dL — ABNORMAL LOW (ref 13.0–17.0)
MCH: 22 pg — ABNORMAL LOW (ref 26.0–34.0)
MCHC: 30.2 g/dL (ref 30.0–36.0)
MCV: 72.7 fL — ABNORMAL LOW (ref 80.0–100.0)
Platelets: 257 10*3/uL (ref 150–400)
RBC: 5.05 MIL/uL (ref 4.22–5.81)
RDW: 16.8 % — ABNORMAL HIGH (ref 11.5–15.5)
WBC: 4.4 10*3/uL (ref 4.0–10.5)
nRBC: 0 % (ref 0.0–0.2)

## 2020-04-19 LAB — COMPREHENSIVE METABOLIC PANEL
ALT: 22 U/L (ref 0–44)
AST: 33 U/L (ref 15–41)
Albumin: 4.3 g/dL (ref 3.5–5.0)
Alkaline Phosphatase: 58 U/L (ref 38–126)
Anion gap: 12 (ref 5–15)
BUN: 27 mg/dL — ABNORMAL HIGH (ref 8–23)
CO2: 25 mmol/L (ref 22–32)
Calcium: 9.6 mg/dL (ref 8.9–10.3)
Chloride: 99 mmol/L (ref 98–111)
Creatinine, Ser: 1.64 mg/dL — ABNORMAL HIGH (ref 0.61–1.24)
GFR calc Af Amer: 47 mL/min — ABNORMAL LOW (ref >60)
GFR calc non Af Amer: 41 mL/min — ABNORMAL LOW (ref >60)
Glucose, Bld: 167 mg/dL — ABNORMAL HIGH (ref 70–99)
Potassium: 4.6 mmol/L (ref 3.5–5.1)
Sodium: 136 mmol/L (ref 135–145)
Total Bilirubin: 1 mg/dL (ref 0.3–1.2)
Total Protein: 8.3 g/dL — ABNORMAL HIGH (ref 6.5–8.1)

## 2020-04-19 LAB — PROTIME-INR
INR: 1 (ref 0.8–1.2)
Prothrombin Time: 13 seconds (ref 11.4–15.2)

## 2020-04-20 LAB — AFP TUMOR MARKER: AFP, Serum, Tumor Marker: 30.7 ng/mL — ABNORMAL HIGH (ref 0.0–8.3)

## 2020-04-24 ENCOUNTER — Other Ambulatory Visit: Payer: Medicare HMO

## 2020-05-08 ENCOUNTER — Inpatient Hospital Stay: Payer: Medicare HMO | Attending: Hematology | Admitting: Hematology

## 2020-05-08 ENCOUNTER — Other Ambulatory Visit: Payer: Self-pay

## 2020-05-08 ENCOUNTER — Inpatient Hospital Stay: Payer: Medicare HMO

## 2020-05-08 VITALS — BP 145/74 | HR 82 | Temp 97.6°F | Resp 18 | Ht 72.0 in | Wt 231.9 lb

## 2020-05-08 DIAGNOSIS — D509 Iron deficiency anemia, unspecified: Secondary | ICD-10-CM | POA: Insufficient documentation

## 2020-05-08 DIAGNOSIS — D472 Monoclonal gammopathy: Secondary | ICD-10-CM | POA: Insufficient documentation

## 2020-05-08 DIAGNOSIS — N189 Chronic kidney disease, unspecified: Secondary | ICD-10-CM | POA: Insufficient documentation

## 2020-05-08 DIAGNOSIS — C220 Liver cell carcinoma: Secondary | ICD-10-CM

## 2020-05-08 DIAGNOSIS — Z79899 Other long term (current) drug therapy: Secondary | ICD-10-CM | POA: Diagnosis not present

## 2020-05-08 LAB — CBC WITH DIFFERENTIAL/PLATELET
Abs Immature Granulocytes: 0.05 10*3/uL (ref 0.00–0.07)
Basophils Absolute: 0 10*3/uL (ref 0.0–0.1)
Basophils Relative: 1 %
Eosinophils Absolute: 0.1 10*3/uL (ref 0.0–0.5)
Eosinophils Relative: 3 %
HCT: 34.3 % — ABNORMAL LOW (ref 39.0–52.0)
Hemoglobin: 10.4 g/dL — ABNORMAL LOW (ref 13.0–17.0)
Immature Granulocytes: 1 %
Lymphocytes Relative: 20 %
Lymphs Abs: 1.1 10*3/uL (ref 0.7–4.0)
MCH: 21.9 pg — ABNORMAL LOW (ref 26.0–34.0)
MCHC: 30.3 g/dL (ref 30.0–36.0)
MCV: 72.2 fL — ABNORMAL LOW (ref 80.0–100.0)
Monocytes Absolute: 0.5 10*3/uL (ref 0.1–1.0)
Monocytes Relative: 9 %
Neutro Abs: 3.5 10*3/uL (ref 1.7–7.7)
Neutrophils Relative %: 66 %
Platelets: 299 10*3/uL (ref 150–400)
RBC: 4.75 MIL/uL (ref 4.22–5.81)
RDW: 16.9 % — ABNORMAL HIGH (ref 11.5–15.5)
WBC: 5.3 10*3/uL (ref 4.0–10.5)
nRBC: 0 % (ref 0.0–0.2)

## 2020-05-08 LAB — TOTAL PROTEIN, URINE DIPSTICK: Protein, ur: NEGATIVE mg/dL

## 2020-05-08 LAB — CMP (CANCER CENTER ONLY)
ALT: 11 U/L (ref 0–44)
AST: 22 U/L (ref 15–41)
Albumin: 3.8 g/dL (ref 3.5–5.0)
Alkaline Phosphatase: 52 U/L (ref 38–126)
Anion gap: 9 (ref 5–15)
BUN: 35 mg/dL — ABNORMAL HIGH (ref 8–23)
CO2: 28 mmol/L (ref 22–32)
Calcium: 9.7 mg/dL (ref 8.9–10.3)
Chloride: 102 mmol/L (ref 98–111)
Creatinine: 2.08 mg/dL — ABNORMAL HIGH (ref 0.61–1.24)
GFR, Est AFR Am: 36 mL/min — ABNORMAL LOW (ref >60)
GFR, Estimated: 31 mL/min — ABNORMAL LOW (ref >60)
Glucose, Bld: 107 mg/dL — ABNORMAL HIGH (ref 70–99)
Potassium: 4.8 mmol/L (ref 3.5–5.1)
Sodium: 139 mmol/L (ref 135–145)
Total Bilirubin: 0.6 mg/dL (ref 0.3–1.2)
Total Protein: 8 g/dL (ref 6.5–8.1)

## 2020-05-08 LAB — TSH: TSH: 0.736 u[IU]/mL (ref 0.320–4.118)

## 2020-05-08 NOTE — Progress Notes (Signed)
HEMATOLOGY/ONCOLOGY CLINIC NOTE  Date of Service: 05/08/2020  Patient Care Team: Jani Gravel, MD as PCP - General (Internal Medicine)  CHIEF COMPLAINTS/PURPOSE OF CONSULTATION:   [facility]- progression,toxicity check with avastin + atezolizumab  HISTORY OF PRESENTING ILLNESS:   Aaron [name] is a wonderful 74 y.o. male who has been referred to [place] by Dr Jani Gravel for evaluation and management of monoclonal gammopathy of undetermined significance.  Patient has history of hypertension, diabetes, dyslipidemia, hepatocellular carcinoma (rx with TACE and percutaneous thermal ablation), hepatitis C status post treatment, iron deficiency anemia in 2014 treated with oral iron.  Patient had an SPEP done by his primary care physician on 10/04/2015 that showed an M spike of 0.3 g/dL. It was presumably will be done due to his complaints of fatigue. He has had no significant anemia. No new bone pains. No fevers/chills/drenching night sweats.  No new anemia..  Outside labs show hemoglobin of 12.4 with microcytosis with an MCV of 70.5, normal WBC count of 5.1k.  No evidence of hypercalcemia or significant renal failure on his outside labs.   INTERVAL HISTORY: Aaron [name]  is here for his scheduled follow-up for MGUS and HCC. The patient's last visit with [place] was on 01/30/2020. The pt reports that he is doing well overall.  The pt reports that he had his first Y90 bead embolization in July, but the second was canceled after he began experiencing stomach pain the Sunday prior. Pt has an upcoming appointment with Dr. Anselm Pancoast on 05/21/20.   Pt is currently drinking around 16-20 oz of water per day. He began having difficulty eating well starting 3-4 days after his first Y90 embolization. He denies nausea, but reports a lack of appetite at the time. Pt is eating better now. He is also becoming more tired with his regular activities.   Lab results today (05/08/20) of CBC w/diff and CMP is as follows: all  values are WNL except for Hgb at 10.4, HCT at 34.3, MCV at 72.2, MCH at 21.9, RDW at 16.9, Glucose at 107, BUN at 35, Creatinine at 2.08, GFR Est Af Am at 36. 05/08/2020 TSH at 0.736 05/08/2020 Total Protein Ur is "Negative" 05/08/2020 AFP tumor marker is in progress  On review of systems, pt reports left shoulder pain, fatigue and denies bloody/black stools, hematuria, abdominal pain, back pain, low appetite, leg swelling, constipation, diarrhea and any other symptoms.   MEDICAL HISTORY:  Past Medical History:  Diagnosis Date  . Arthritis   . Diabetes mellitus without complication ([place])    type II   . Elevated liver enzymes   . Fatty liver   . Hepatitis C    genotype 1A status post treatment with Viekira and Ribavarin for 24 weeks  . Hepatocellular carcinoma ([place]) 01/30/2014   Path  . History of colon polyps   . Hyperlipidemia   . Hypertension   . Iron deficiency anemia    hx of   . MGUS (monoclonal gammopathy of unknown significance)    Obesity Hepatocellular carcinoma treated with TACE and percutaneous thermal ablation by interventional radiology. Last MRI on 08/06/2015 showed slight decrease in the size of the ablation defect involving the right lobe of the liver area and no findings to suggest residual or recurrent hepatocellular carcinoma. Mild changes of liver cirrhosis. MRI Abd 06/24/2016- no evidence of recurrent HCC   Hepatitis C genotype 1A status post treatment with Viekira and Ribavarin for 24 weeks.  Monoclonal gammopathy of undetermined significance.  SURGICAL HISTORY:  Status post microwave ablation[October 2015] of liver lesion and TACE [July 2015] for [place]. EGD and colonoscopy in 2016   SOCIAL HISTORY: Social History   Socioeconomic History  . Marital status: Married    Spouse name: Not on file  . Number of children: Not on file  . Years of education: Not on file  . Highest education level: Not on file  Occupational History  . Not on file  Tobacco Use    . Smoking status: Former Smoker    Quit date: 01/30/1994    Years since quitting: 26.2  . Smokeless tobacco: Never Used  . Tobacco comment: stopped 25 yrs ago  Vaping Use  . Vaping Use: Never used  Substance and Sexual Activity  . Alcohol use: No    Comment: stopped 30 years ago   . Drug use: No  . Sexual activity: Not Currently  Other Topics Concern  . Not on file  Social History Narrative  . Not on file   Social Determinants of Health   Financial Resource Strain:   . Difficulty of Paying Living Expenses: Not on file  Food Insecurity:   . Worried About Charity fundraiser in the Last Year: Not on file  . Ran Out of Food in the Last Year: Not on file  Transportation Needs:   . Lack of Transportation (Medical): Not on file  . Lack of Transportation (Non-Medical): Not on file  Physical Activity:   . Days of Exercise per Week: Not on file  . Minutes of Exercise per Session: Not on file  Stress:   . Feeling of Stress : Not on file  Social Connections:   . Frequency of Communication with Friends and Family: Not on file  . Frequency of Social Gatherings with Friends and Family: Not on file  . Attends Religious Services: Not on file  . Active Member of Clubs or Organizations: Not on file  . Attends Archivist Meetings: Not on file  . Marital Status: Not on file  Intimate Partner Violence:   . Fear of Current or Ex-Partner: Not on file  . Emotionally Abused: Not on file  . Physically Abused: Not on file  . Sexually Abused: Not on file  Former smoker and smoked 1 pack per day for about 20 years starting at age 14 quit 5 years ago.  FAMILY HISTORY: No family history on file.  ALLERGIES:  has No Known Allergies.  MEDICATIONS:  Current Outpatient Medications  Medication Sig Dispense Refill  . ACCU-CHEK AVIVA PLUS test strip     . Accu-Chek Softclix Lancets lancets     . ALPRAZolam (XANAX) 0.5 MG tablet Take 1 tablet (0.5 mg total) by mouth as directed. Take 2  tablets (1.0 mg total) prior to MR scan.  Make take an additional 0.5 mg po if needed. (Patient not taking: Reported on 07/28/2019) 3 tablet 0  . amLODipine (NORVASC) 10 MG tablet Take 1 tablet (10 mg total) by mouth daily. 30 tablet 3  . aspirin EC 81 MG tablet Take 81 mg by mouth every morning.     . cholecalciferol (VITAMIN D) 1000 UNITS tablet Take 1,000 Units by mouth every morning.     . Insulin Glargine (LANTUS SOLOSTAR) 100 UNIT/ML Solostar Pen Inject 32 Units into the skin daily.    [name] Kitchen lisinopril-hydrochlorothiazide (ZESTORETIC) 20-12.5 MG tablet TAKE 2 TABLETS BY MOUTH DAILY WITH BREAKFAST. 60 tablet 1  . metFORMIN (GLUCOPHAGE) 1000 MG tablet Take 1,000 mg by mouth 2 (two)  times daily with a meal.    . Multiple Vitamin (MULTIVITAMIN WITH MINERALS) TABS tablet Take 1 tablet daily by mouth.    [name] Kitchen omeprazole (PRILOSEC) 20 MG capsule Take 1 capsule (20 mg total) by mouth daily. 30 capsule 1  . oxyCODONE (OXY IR/ROXICODONE) 5 MG immediate release tablet Take 1 tablet (5 mg total) by mouth every 6 (six) hours as needed for severe pain. 15 tablet 0  . PAZEO 0.7 % SOLN Place 1 drop into both eyes every morning.   3  . simvastatin (ZOCOR) 40 MG tablet Take 20 mg by mouth every evening.      Current Facility-Administered Medications  Medication Dose Route Frequency Provider Last Rate Last Admin  . oxyCODONE (Oxy IR/ROXICODONE) immediate release tablet 5 mg  5 mg Oral Q6H PRN Jacqualine Mau, NP        REVIEW OF SYSTEMS:   A 10+ POINT REVIEW OF SYSTEMS WAS OBTAINED including neurology, dermatology, psychiatry, cardiac, respiratory, lymph, extremities, GI, GU, Musculoskeletal, constitutional, breasts, reproductive, HEENT.  All pertinent positives are noted in the HPI.  All others are negative.   PHYSICAL EXAMINATION: ECOG FS:1 - Symptomatic but completely ambulatory  There were no vitals filed for this visit. Wt Readings from Last 3 Encounters:  03/01/20 246 lb (111.6 kg)  02/14/20 249 lb  (112.9 kg)  01/17/20 247 lb (112 kg)   There is no height or weight on file to calculate BMI.    GENERAL:alert, in no acute distress and comfortable SKIN: no acute rashes, no significant lesions EYES: conjunctiva are pink and non-injected, sclera anicteric OROPHARYNX: MMM, no exudates, no oropharyngeal erythema or ulceration NECK: supple, no JVD LYMPH:  no palpable lymphadenopathy in the cervical, axillary or inguinal regions LUNGS: clear to auscultation b/l with normal respiratory effort HEART: regular rate & rhythm ABDOMEN:  normoactive bowel sounds , non tender, not distended. No palpable hepatosplenomegaly.  Extremity: no pedal edema PSYCH: alert & oriented x 3 with fluent speech NEURO: no focal motor/sensory deficits  LABORATORY DATA:  I have reviewed the data as listed  . CBC Latest Ref Rng & Units 04/19/2020 03/01/2020 02/14/2020  WBC 4.0 - 10.5 K/uL 4.4 7.4 5.4  Hemoglobin 13.0 - 17.0 g/dL 11.1(L) 12.9(L) 13.8  Hematocrit 39 - 52 % 36.7(L) 42.4 46.1  Platelets 150 - 400 K/uL 257 428(H) 244   . CBC    Component Value Date/Time   WBC 4.4 04/19/2020 0832   RBC 5.05 04/19/2020 0832   HGB 11.1 (L) 04/19/2020 0832   HGB 12.6 (L) 12/27/2019 0848   HGB 11.5 (L) 03/22/2017 1402   HCT 36.7 (L) 04/19/2020 0832   HCT 37.0 (L) 03/22/2017 1402   PLT 257 04/19/2020 0832   PLT 256 12/27/2019 0848   PLT 262 03/22/2017 1402   MCV 72.7 (L) 04/19/2020 0832   MCV 72.3 (L) 03/22/2017 1402   MCH 22.0 (L) 04/19/2020 0832   MCHC 30.2 04/19/2020 0832   RDW 16.8 (H) 04/19/2020 0832   RDW 15.6 (H) 03/22/2017 1402   LYMPHSABS 1.6 03/01/2020 0825   LYMPHSABS 1.8 03/22/2017 1402   MONOABS 0.8 03/01/2020 0825   MONOABS 0.4 03/22/2017 1402   EOSABS 0.2 03/01/2020 0825   EOSABS 0.1 03/22/2017 1402   BASOSABS 0.1 03/01/2020 0825   BASOSABS 0.0 03/22/2017 1402    . CMP Latest Ref Rng & Units 04/19/2020 03/01/2020 02/16/2020  Glucose 70 - 99 mg/dL 167(H) 136(H) 159(H)  BUN 8 - 23 mg/dL 27(H)  31(H) 25(H)  Creatinine 0.61 - 1.24 mg/dL 1.64(H) 1.65(H) 1.63(H)  Sodium 135 - 145 mmol/L 136 140 138  Potassium 3.5 - 5.1 mmol/L 4.6 4.8 5.0  Chloride 98 - 111 mmol/L 99 102 101  CO2 22 - 32 mmol/L 25 25 28   Calcium 8.9 - 10.3 mg/dL 9.6 9.6 9.3  Total Protein 6.5 - 8.1 g/dL 8.3(H) 8.7(H) -  Total Bilirubin 0.3 - 1.2 mg/dL 1.0 0.5 -  Alkaline Phos 38 - 126 U/L 58 58 -  AST 15 - 41 U/L 33 34 -  ALT 0 - 44 U/L 22 17 -    06/21/2019 MR ABDOMEN WWO CONTRAST (Accession [id number])     IFE 1  Comment   Comments: Immunofixation shows IgG monoclonal protein with kappa light chain  specificity.           RADIOGRAPHIC STUDIES:  MRI abd w and wo contrast: 12/17/2016: IMPRESSION: 1. Postprocedural changes of thermal ablation again noted in the right lobe of the liver, without definitive evidence to suggest local recurrence of disease. No new hepatic lesions are noted. 2. Additional incidental findings, as above.   Electronically Signed   By: Vinnie Langton M.D.   On: 12/17/2016 09:42    MRI abd w and wo contrast 01/26/2020 IMPRESSION: 1. Interval enlargement of multiple hepatic masses as detailed above, consistent with progression of multifocal hepatocellular carcinoma. There is evidence of prior ablation in hepatic segments VII and VIII. 2. No evidence of metastatic disease in the abdomen.   Electronically Signed   By: Eddie Candle M.D.   On: 01/27/2020 15:51   ASSESSMENT & PLAN:   74 y.o. male with  #1 Monoclonal gammopathy of undetermined significance.  M protein 0.2 mg/dL immunofixation showing IgG monoclonal protein with kappa light chain specificity. SPEP 02/2016 - 0.3g/dl  No overt anemia.  No overt hypercalcemia. Stable CKD creatinine 1.6  #2 bone lucencies in the right radial mid midshaft and left humeral head. PET/CT did not show any hypermetabolic bone lesions. MRI left shoulder showed no evidence of metastatic disease or multiple myeloma. The  x-ray findings appear to be areas of mild demineralization. Patient was seen by orthopedics and no additional recommendations were given.  Plan -Patient's anemia is stable. No significant change in renal function. No new bone pains. No hypercalcemia . No overall no overt evidence of progression of multiple myeloma. -Myeloma labs from today show stable M protein @ 0.2g/dl with no evidence of progression- consistent with MGUS.  #3 Hepatocellular carcinoma treated with TACE and percutaneous thermal ablation by interventional radiology.   Last MRI on 08/06/2015 showed slight decrease in the size of the ablation defect involving the right lobe of the liver area and no findings to suggest residual or recurrent hepatocellular carcinoma. Mild changes of liver cirrhosis.  01/16/2016 MRI Abdomen showed resolution of previously ablated lesion but a new 1.3 cm lesion was noted which was subsequently ablated under CT guidance by interventional radiology on 01/31/2016. Elevated transaminases on 02/01/2016 were likely related to his ablation. These have since resolved.   06/24/2016 MRI Abdomen showed no evidence of recurrent hepatocellular carcinoma . 12/17/2016 MRI Abdomen shows no evidence of recurrent hepatocellular carcinoma .  10/26/17 MRI Abdomen revealed Motion degraded images. Status post thermal ablation of the segment 8 lesion, without enhancement to suggest residual viable tumor. Prior ablation of a segment 7 lesion, grossly unchanged. No convincing central enhancement  03/14/2019  MRI abdomen w and wo contrast revealed "1. New areas of restricted diffusion, surrounding nodular arterial  phase enhancement and delayed washout surrounding the ablation zone defects anteriorly in segments 8 and 7 consistent with local recurrence of hepatocellular carcinoma. 2. The peripheral ablation zone defect laterally in segment 8 is unchanged. 3. No extrahepatic tumor identified."  10/02/2019 MRI Abdomen ([id number])  revealed "1. Progressive multifocal hepatocellular carcinoma, especially anteriorly around the ablated lesion in segments 4B and 8. There are additional new lesions in segments 2 and 3. No evidence of extrahepatic metastatic disease."  01/26/2020 MRI Abdomen ([id number]) revealed "1. Interval enlargement of multiple hepatic masses as detailed above, consistent with progression of multifocal hepatocellular carcinoma. There is evidence of prior ablation in hepatic segments VII and VIII. 2. No evidence of metastatic disease in the abdomen."  #4 Hepatitis C genotype 1A status post treatment with Viekira and Ribavarin for 24 weeks.   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   PLAN:  -Discussed pt labwork today, 05/08/20;  all values are WNL except for Hgb at 10.4, HCT at 34.3, MCV at 72.2, MCH at 21.9, RDW at 16.9, Glucose at 107, BUN at 35, Creatinine at 2.08, GFR Est Af Am at 36. -Discussed 05/08/2020 TSH at 0.736 - WNL -Discussed 05/08/2020 Total Protein Ur is "Negative" -Discussed 05/08/2020 AFP tumor marker is in progress, 04/19/20 AFP tumor marker trending down at 30.7. -Advised pt that we hope to begin Lenvatinib after the completion of Radiation, as the Atezolizumab + Avastin was not holding the disease.  -Recommended that the pt continue to eat well, drink at least 48-64 oz of water each day, and walk 20-30 minutes each day.  -Recommend pt f/u with Dr. Anselm Pancoast as scheduled -Will f/u with Dr Anselm Pancoast for rpt Y90 bead embolization timeline.  -We shall f/u with patient with labs to consider Lenvatinib for systemic rx of [place] s/p Y90 bead embolization.   The total time spent in the appt was 20 minutes and more than 50% was on counseling and direct patient cares.  All of the patient's questions were answered with apparent satisfaction. The patient knows to call the clinic with any problems, questions or concerns.   [name] MD  [place] AAHIVMS [facility] Hematology/Oncology Physician [facility]  (Office):       [phone] (Work cell):  [phone] (Fax):           [phone]   I, Yevette Edwards, am acting as a scribe for Dr. [name].   .I have reviewed the above documentation for accuracy and completeness, and I agree with the above. Brunetta Genera MD

## 2020-05-09 LAB — AFP TUMOR MARKER: AFP, Serum, Tumor Marker: 35.8 ng/mL — ABNORMAL HIGH (ref 0.0–8.3)

## 2020-05-17 ENCOUNTER — Other Ambulatory Visit: Payer: Self-pay | Admitting: Hematology

## 2020-05-21 ENCOUNTER — Ambulatory Visit
Admission: RE | Admit: 2020-05-21 | Discharge: 2020-05-21 | Disposition: A | Discharge status: Home / Self Care | Payer: Medicare HMO | Source: Ambulatory Visit | Attending: Diagnostic Radiology | Admitting: Diagnostic Radiology

## 2020-05-21 ENCOUNTER — Other Ambulatory Visit: Payer: Self-pay | Admitting: Diagnostic Radiology

## 2020-05-21 ENCOUNTER — Encounter: Payer: Self-pay | Admitting: *Deleted

## 2020-05-21 DIAGNOSIS — C220 Liver cell carcinoma: Secondary | ICD-10-CM

## 2020-05-21 DIAGNOSIS — Z9889 Other specified postprocedural states: Secondary | ICD-10-CM | POA: Diagnosis not present

## 2020-05-21 HISTORY — PX: IR RADIOLOGIST EVAL & MGMT: IMG5224

## 2020-05-21 NOTE — Progress Notes (Signed)
Chief Complaint: Follow up clinic visit of Aaron [name]  Referring Physician(s): Dr. Carolyne Fiscal  Supervising Physician: Markus Daft   History of Present Illness: Aaron [name] is a 74 y.o. male. History of DM, HLD, HTN, anemia, hepatitis C, hepatocellular carcinoma (diagnosed in 2015)  and monoclonal gammopathy. Previous treatments for [facility] include DEB Tace performed on 7.21.15 and 8.11.21. Thermal ablation on 10.9.15, 6.19.17, 11.28.18 and 11.6.19. Bland embolization on 9.22.20.  A mapping study was performed on  6.23.21 with embolization of the gastroduodenal artery and the right gastric artery. The most recent procedure performed on  7.9.21 a  Y 90 radio- embolization to the left hepatic lobe.  Plan was to return for a Y 90 of the right hepatic artery in 6 weeks time however the patient was experiencing abdominal pain and this final procedure was ultimately cancelled.   All procedures where performed by Dr. Marzetta Merino. AFP has shown improved since Y 90 treatment  of the left hepatic lobe with the latest results of 35.8 on  9.15.21 ( a slight uptrend)  Patient has had two round of systemic therapy with  atezolizumab + avastin on 3.4.21 and 5.26.21. Aaron [name] remains active. He denies any fevers, chills, abdominal pain. He reports normal stools. He states that his appetite was slow to return to normal after the Y 90 but returned within three weeks post procedure. He endorsees fatigue and states that his strength is not back to baseline. Patient reports that it use to take one day to mow his lawn it now takes two. Patient does verbalize improvement but states that it is slow.   Past Medical History:  Diagnosis Date  . Arthritis   . Diabetes mellitus without complication ([place])    type II   . Elevated liver enzymes   . Fatty liver   . Hepatitis C    genotype 1A status post treatment with Viekira and Ribavarin for 24 weeks  . Hepatocellular carcinoma ([place]) 01/30/2014   Path  . History of colon polyps   .  Hyperlipidemia   . Hypertension   . Iron deficiency anemia    hx of   . MGUS (monoclonal gammopathy of unknown significance)     Past Surgical History:  Procedure Laterality Date  . COLONOSCOPY    . ESOPHAGOGASTRODUODENOSCOPY  2016  . IR ANGIOGRAM SELECTIVE EACH ADDITIONAL VESSEL  04/04/2019  . IR ANGIOGRAM SELECTIVE EACH ADDITIONAL VESSEL  04/04/2019  . IR ANGIOGRAM SELECTIVE EACH ADDITIONAL VESSEL  04/04/2019  . IR ANGIOGRAM SELECTIVE EACH ADDITIONAL VESSEL  04/04/2019  . IR ANGIOGRAM SELECTIVE EACH ADDITIONAL VESSEL  04/04/2019  . IR ANGIOGRAM SELECTIVE EACH ADDITIONAL VESSEL  04/04/2019  . IR ANGIOGRAM SELECTIVE EACH ADDITIONAL VESSEL  04/04/2019  . IR ANGIOGRAM SELECTIVE EACH ADDITIONAL VESSEL  05/16/2019  . IR ANGIOGRAM SELECTIVE EACH ADDITIONAL VESSEL  05/16/2019  . IR ANGIOGRAM SELECTIVE EACH ADDITIONAL VESSEL  05/16/2019  . IR ANGIOGRAM SELECTIVE EACH ADDITIONAL VESSEL  02/14/2020  . IR ANGIOGRAM SELECTIVE EACH ADDITIONAL VESSEL  02/14/2020  . IR ANGIOGRAM SELECTIVE EACH ADDITIONAL VESSEL  02/14/2020  . IR ANGIOGRAM SELECTIVE EACH ADDITIONAL VESSEL  02/14/2020  . IR ANGIOGRAM SELECTIVE EACH ADDITIONAL VESSEL  03/01/2020  . IR ANGIOGRAM SELECTIVE EACH ADDITIONAL VESSEL  03/01/2020  . IR ANGIOGRAM SELECTIVE EACH ADDITIONAL VESSEL  03/01/2020  . IR ANGIOGRAM SELECTIVE EACH ADDITIONAL VESSEL  03/01/2020  . IR ANGIOGRAM SELECTIVE EACH ADDITIONAL VESSEL  03/01/2020  . IR ANGIOGRAM VISCERAL SELECTIVE  04/04/2019  . IR ANGIOGRAM VISCERAL  SELECTIVE  05/16/2019  . IR ANGIOGRAM VISCERAL SELECTIVE  02/14/2020  . IR ANGIOGRAM VISCERAL SELECTIVE  02/14/2020  . IR ANGIOGRAM VISCERAL SELECTIVE  03/01/2020  . IR EMBO ARTERIAL NOT HEMORR HEMANG INC GUIDE ROADMAPPING  02/14/2020  . IR EMBO TUMOR ORGAN ISCHEMIA INFARCT INC GUIDE ROADMAPPING  04/04/2019  . IR EMBO TUMOR ORGAN ISCHEMIA INFARCT INC GUIDE ROADMAPPING  05/16/2019  . IR EMBO TUMOR ORGAN ISCHEMIA INFARCT INC GUIDE ROADMAPPING  03/01/2020  . IR GENERIC HISTORICAL   02/26/2016   IR RADIOLOGIST EVAL & MGMT 02/26/2016 Markus Daft, MD [facility] INTER[facility] RAD  . IR GENERIC HISTORICAL  01/16/2016   IR RADIOLOGIST EVAL & MGMT 01/16/2016 Markus Daft, MD [facility] INTER[facility] RAD  . IR GENERIC HISTORICAL  06/24/2016   IR RADIOLOGIST EVAL & MGMT 06/24/2016 Aletta Edouard, MD [facility] INTER[facility] RAD  . IR RADIOLOGIST EVAL & MGMT  12/17/2016  . IR RADIOLOGIST EVAL & MGMT  06/16/2017  . IR RADIOLOGIST EVAL & MGMT  09/08/2017  . IR RADIOLOGIST EVAL & MGMT  10/26/2017  . IR RADIOLOGIST EVAL & MGMT  05/12/2018  . IR RADIOLOGIST EVAL & MGMT  07/26/2018  . IR RADIOLOGIST EVAL & MGMT  03/22/2019  . IR RADIOLOGIST EVAL & MGMT  04/11/2019  . IR RADIOLOGIST EVAL & MGMT  06/28/2019  . IR RADIOLOGIST EVAL & MGMT  10/10/2019  . IR RADIOLOGIST EVAL & MGMT  05/21/2020  . IR US GUIDE VASC ACCESS LEFT  05/16/2019  . IR US GUIDE VASC ACCESS RIGHT  04/04/2019  . IR US GUIDE VASC ACCESS RIGHT  02/14/2020  . IR US GUIDE VASC ACCESS RIGHT  03/01/2020  . POLYPECTOMY    . RADIOFREQUENCY ABLATION N/A 07/21/2017   Procedure: CT MICROWAVE THERMAL ABLATION-LIVER;  Surgeon: Markus Daft, MD;  Location: [facility];  Service: Anesthesiology;  Laterality: N/A;  . RADIOFREQUENCY ABLATION N/A 06/29/2018   Procedure: CT MICROWAVE THERMAL ABLATION;  Surgeon: Markus Daft, MD;  Location: [facility];  Service: Anesthesiology;  Laterality: N/A;    Allergies: Patient has no known allergies.  Medications: Prior to Admission medications   Medication Sig Start Date End Date Taking? Authorizing Provider  ACCU-CHEK AVIVA PLUS test strip  11/19/19   [provider]  Accu-Chek Softclix Lancets lancets  09/12/19   [provider]  ALPRAZolam Duanne Moron) 0.5 MG tablet Take 1 tablet (0.5 mg total) by mouth as directed. Take 2 tablets (1.0 mg total) prior to MR scan.  Make take an additional 0.5 mg po if needed. Patient not taking: Reported on 07/28/2019 10/26/17   Markus Daft, MD  amLODipine (NORVASC) 10 MG tablet Take 1 tablet (10 mg total) by mouth  daily. 11/29/19   Brunetta Genera, MD  aspirin EC 81 MG tablet Take 81 mg by mouth every morning.     [provider]  cholecalciferol (VITAMIN D) 1000 UNITS tablet Take 1,000 Units by mouth every morning.     [provider]  Insulin Glargine (LANTUS SOLOSTAR) 100 UNIT/ML Solostar Pen Inject 32 Units into the skin daily.    [provider]  lisinopril-hydrochlorothiazide (ZESTORETIC) 20-12.5 MG tablet TAKE 2 TABLETS BY MOUTH DAILY WITH BREAKFAST. 05/17/20   Brunetta Genera, MD  metFORMIN (GLUCOPHAGE) 1000 MG tablet Take 1,000 mg by mouth 2 (two) times daily with a meal.    [provider]  Multiple Vitamin (MULTIVITAMIN WITH MINERALS) TABS tablet Take 1 tablet daily by mouth.    [provider]  omeprazole (PRILOSEC) 20 MG capsule Take  1 capsule (20 mg total) by mouth daily. 02/14/20 04/14/20  Candiss Norse A, PA-C  oxyCODONE (OXY IR/ROXICODONE) 5 MG immediate release tablet Take 1 tablet (5 mg total) by mouth every 6 (six) hours as needed for severe pain. 03/01/20   Brynda Greathouse Sue-Ellen, PA  PAZEO 0.7 % SOLN Place 1 drop into both eyes every morning.  05/09/18   [provider]  simvastatin (ZOCOR) 40 MG tablet Take 20 mg by mouth every evening.     [provider]     No family history on file.  Social History   Socioeconomic History  . Marital status: Married    Spouse name: Not on file  . Number of children: Not on file  . Years of education: Not on file  . Highest education level: Not on file  Occupational History  . Not on file  Tobacco Use  . Smoking status: Former Smoker    Quit date: 01/30/1994    Years since quitting: 26.3  . Smokeless tobacco: Never Used  . Tobacco comment: stopped 25 yrs ago  Vaping Use  . Vaping Use: Never used  Substance and Sexual Activity  . Alcohol use: No    Comment: stopped 30 years ago   . Drug use: No  . Sexual activity: Not Currently  Other Topics Concern  . Not on  file  Social History Narrative  . Not on file   Social Determinants of Health   Financial Resource Strain:   . Difficulty of Paying Living Expenses: Not on file  Food Insecurity:   . Worried About Charity fundraiser in the Last Year: Not on file  . Ran Out of Food in the Last Year: Not on file  Transportation Needs:   . Lack of Transportation (Medical): Not on file  . Lack of Transportation (Non-Medical): Not on file  Physical Activity:   . Days of Exercise per Week: Not on file  . Minutes of Exercise per Session: Not on file  Stress:   . Feeling of Stress : Not on file  Social Connections:   . Frequency of Communication with Friends and Family: Not on file  . Frequency of Social Gatherings with Friends and Family: Not on file  . Attends Religious Services: Not on file  . Active Member of Clubs or Organizations: Not on file  . Attends Archivist Meetings: Not on file  . Marital Status: Not on file      Review of Systems: A 12 point ROS discussed and pertinent positives are indicated in the HPI above.  All other systems are negative.  Review of Systems  Constitutional: Positive for fatigue ( patient reports his strength has not returned to levels prior to theY 90 but are improving. ). Negative for fever. Appetite change:  returned 3 weeks post procedure.   HENT: Negative for congestion.   Respiratory: Negative for cough and shortness of breath.   Cardiovascular: Negative for chest pain.  Gastrointestinal: Negative for abdominal pain.  Neurological: Negative for headaches.  Psychiatric/Behavioral: Negative for behavioral problems and confusion.    Vital Signs: There were no vitals taken for this visit.  Physical Exam Vitals and nursing note reviewed.  Constitutional:      Appearance: He is well-developed.  HENT:     Head: Normocephalic.  Cardiovascular:     Rate and Rhythm: Normal rate and regular rhythm.     Heart sounds: Normal heart sounds.      Comments: Right groin site  is soft with no active bleeding and no appreciable pseudoaneurysm.  Pulmonary:     Effort: Pulmonary effort is normal.     Breath sounds: Normal breath sounds.  Abdominal:     Tenderness: There is no abdominal tenderness.  Musculoskeletal:        General: Normal range of motion.     Cervical back: Normal range of motion.  Skin:    General: Skin is dry.  Neurological:     Mental Status: He is alert and oriented to person, place, and time.     Imaging: IR Radiologist Eval & Mgmt  Result Date: 05/21/2020 Please refer to notes tab for details about interventional procedure. (Op Note)   Labs:  CBC: Recent Labs    02/14/20 0855 03/01/20 0825 04/19/20 0832 05/08/20 0948  WBC 5.4 7.4 4.4 5.3  HGB 13.8 12.9* 11.1* 10.4*  HCT 46.1 42.4 36.7* 34.3*  PLT 244 428* 257 299    COAGS: Recent Labs    02/14/20 0855 03/01/20 0825 04/19/20 0832  INR 0.9 1.0 1.0  APTT 30  --   --     BMP: Recent Labs    02/16/20 0825 03/01/20 0825 04/19/20 0832 05/08/20 0948  NA 138 140 136 139  K 5.0 4.8 4.6 4.8  CL 101 102 99 102  CO2 28 25 25 28   GLUCOSE 159* 136* 167* 107*  BUN 25* 31* 27* 35*  CALCIUM 9.3 9.6 9.6 9.7  CREATININE 1.63* 1.65* 1.64* 2.08*  GFRNONAA 41* 41* 41* 31*  GFRAA 48* 47* 47* 36*    LIVER FUNCTION TESTS: Recent Labs    02/14/20 0855 03/01/20 0825 04/19/20 0832 05/08/20 0948  BILITOT 0.6 0.5 1.0 0.6  AST 47* 34 33 22  ALT 21 17 22 11   ALKPHOS 39 58 58 52  PROT 8.3* 8.7* 8.3* 8.0  ALBUMIN 4.5 4.0 4.3 3.8    TUMOR MARKERS: No results for input(s): AFPTM, CEA, CA199, CHROMGRNA in the last 8760 hours.  Assessment and Plan:  74 y.o. male Aaron [name] is a 74 y.o. male. History of DM, HLD, HTN, anemia, hepatitis C, hepatocellular carcinoma (diagnosed in 2015)  and monoclonal gammopathy. Previous treatments for Aaron [name] include DEB Tace performed on 7.21.15 and 8.11.21. Thermal ablation on 10.9.15, 6.19.17, 11.28.18 and  11.6.19. Bland embolization on 9.22.20.  A mapping study was performed on  6.23.21 with embolization of the gastroduodenal artery and the right gastric artery. The most recent procedure performed on  7.9.21 a Y 90 radio- embolization to the left hepatic lobe.  Plan was  to return for a Y 90 of the right hepatic artery in 6 weeks time however the patient was experiencing abdominal pain and this final procedure was ultimately cancelled.   All procedures where performed by Dr. Marzetta Merino.  AFP has shown improved since Y 90 treatment  of the left hepatic lobe with the latest results of 35.8 on  9.15.21 ( a slight uptrend). Patient has had two round of systemic therapy with  atezolizumab + avastin on 3.4.21 and 5.26.21. There is no pertinent imaging since his last procedure was performed. Aaron [name] is being followed by Dr. Irene Limbo and was last seen on 9.15.21.  Per notes from Dr. Irene Limbo dated 9.15.21 the plans to begin  lenvatinib after completion of radiation. Plan of care to be discussed with Dr. Irene Limbo.    Thank you for this interesting consult.  I greatly enjoyed meeting Aaron [name] and look forward to participating in their care.  A copy of this report was sent to the requesting provider on this date.  Electronically Signed: Jacqualine Mau, NP 05/21/2020, 9:03 AM   I spent a total of  10 minutes   in face to face in clinical consultation, greater than 50% of which was counseling/coordinating care for Aaron [name]

## 2020-05-28 ENCOUNTER — Other Ambulatory Visit: Payer: Self-pay

## 2020-05-28 ENCOUNTER — Ambulatory Visit (HOSPITAL_COMMUNITY)
Admission: RE | Admit: 2020-05-28 | Discharge: 2020-05-28 | Disposition: A | Discharge status: Home / Self Care | Payer: Medicare HMO | Source: Ambulatory Visit | Attending: Diagnostic Radiology | Admitting: Diagnostic Radiology

## 2020-05-28 DIAGNOSIS — C220 Liver cell carcinoma: Secondary | ICD-10-CM | POA: Insufficient documentation

## 2020-05-28 DIAGNOSIS — K769 Liver disease, unspecified: Secondary | ICD-10-CM | POA: Diagnosis not present

## 2020-05-28 MED ORDER — GADOBUTROL 1 MMOL/ML IV SOLN
9.0000 mL | Freq: Once | INTRAVENOUS | Status: AC | PRN
  Administered 2020-05-28: 9 mL via INTRAVENOUS

## 2020-06-03 ENCOUNTER — Other Ambulatory Visit: Payer: Self-pay | Admitting: Diagnostic Radiology

## 2020-06-03 DIAGNOSIS — C220 Liver cell carcinoma: Secondary | ICD-10-CM

## 2020-06-08 ENCOUNTER — Ambulatory Visit: Payer: Medicare HMO | Attending: Internal Medicine

## 2020-06-08 DIAGNOSIS — Z23 Encounter for immunization: Secondary | ICD-10-CM

## 2020-06-08 NOTE — Progress Notes (Signed)
   Covid-19 Vaccination Clinic  Name:  Aaron [name]    MRN: [id number] DOB: 1946/01/09  06/08/2020  Aaron [name] was observed post Covid-19 immunization for 15 minutes without incident. He was provided with Vaccine Information Sheet and instruction to access the V-Safe system.   Aaron [name] was instructed to call 911 with any severe reactions post vaccine: [name] Kitchen Difficulty breathing  . Swelling of face and throat  . A fast heartbeat  . A bad rash all over body  . Dizziness and weakness

## 2020-06-12 ENCOUNTER — Encounter: Payer: Self-pay | Admitting: *Deleted

## 2020-06-12 ENCOUNTER — Other Ambulatory Visit: Payer: Self-pay

## 2020-06-12 ENCOUNTER — Ambulatory Visit
Admission: RE | Admit: 2020-06-12 | Discharge: 2020-06-12 | Disposition: A | Discharge status: Home / Self Care | Payer: Medicare HMO | Source: Ambulatory Visit | Attending: Diagnostic Radiology | Admitting: Diagnostic Radiology

## 2020-06-12 DIAGNOSIS — C220 Liver cell carcinoma: Secondary | ICD-10-CM | POA: Diagnosis not present

## 2020-06-12 HISTORY — PX: IR RADIOLOGIST EVAL & MGMT: IMG5224

## 2020-06-12 NOTE — Progress Notes (Signed)
Chief Complaint: Patient was consulted remotely today (TeleHealth) for discussion of recent imaging  Referring Physician(s): [name]  History of Present Illness: Leny Morozov is a 74 y.o. male with multifocal hepatocellular carcinoma.  I have been treating the patient for many years with liver directed therapy.  Most recently, the patient underwent Y 90 radioembolization to the large tumor burden in the medial segment of the left hepatic lobe.  The patient was scheduled to undergo treatment of the right hepatic lobe approximately a month after treatment to the left hepatic lobe but the procedure was canceled by the patient due to abdominal symptoms.  The abdominal symptoms have resolved and the patient had recent MRI study to evaluate the recent radioembolization procedure.  Recent imaging demonstrates residual disease that appears to be predominantly in the right hepatic artery distribution.  Patient has no specific complaints at this time.  He is doing his normal daily living activities and still doing some outside house/yard work.  Patient denies of chest pain or respiratory difficulties.  He recently got his booster shot for the COVID-19 vaccine.  Patient has not had his flu shot yet.  Patient's appetite is normal and he has not having unintentional weight loss.  Past Medical History:  Diagnosis Date  . Arthritis   . Diabetes mellitus without complication ([place])    type II   . Elevated liver enzymes   . Fatty liver   . Hepatitis C    genotype 1A status post treatment with Viekira and Ribavarin for 24 weeks  . Hepatocellular carcinoma ([place]) 01/30/2014   Path  . History of colon polyps   . Hyperlipidemia   . Hypertension   . Iron deficiency anemia    hx of   . MGUS (monoclonal gammopathy of unknown significance)     Past Surgical History:  Procedure Laterality Date  . COLONOSCOPY    . ESOPHAGOGASTRODUODENOSCOPY  2016  . IR ANGIOGRAM SELECTIVE EACH ADDITIONAL VESSEL   04/04/2019  . IR ANGIOGRAM SELECTIVE EACH ADDITIONAL VESSEL  04/04/2019  . IR ANGIOGRAM SELECTIVE EACH ADDITIONAL VESSEL  04/04/2019  . IR ANGIOGRAM SELECTIVE EACH ADDITIONAL VESSEL  04/04/2019  . IR ANGIOGRAM SELECTIVE EACH ADDITIONAL VESSEL  04/04/2019  . IR ANGIOGRAM SELECTIVE EACH ADDITIONAL VESSEL  04/04/2019  . IR ANGIOGRAM SELECTIVE EACH ADDITIONAL VESSEL  04/04/2019  . IR ANGIOGRAM SELECTIVE EACH ADDITIONAL VESSEL  05/16/2019  . IR ANGIOGRAM SELECTIVE EACH ADDITIONAL VESSEL  05/16/2019  . IR ANGIOGRAM SELECTIVE EACH ADDITIONAL VESSEL  05/16/2019  . IR ANGIOGRAM SELECTIVE EACH ADDITIONAL VESSEL  02/14/2020  . IR ANGIOGRAM SELECTIVE EACH ADDITIONAL VESSEL  02/14/2020  . IR ANGIOGRAM SELECTIVE EACH ADDITIONAL VESSEL  02/14/2020  . IR ANGIOGRAM SELECTIVE EACH ADDITIONAL VESSEL  02/14/2020  . IR ANGIOGRAM SELECTIVE EACH ADDITIONAL VESSEL  03/01/2020  . IR ANGIOGRAM SELECTIVE EACH ADDITIONAL VESSEL  03/01/2020  . IR ANGIOGRAM SELECTIVE EACH ADDITIONAL VESSEL  03/01/2020  . IR ANGIOGRAM SELECTIVE EACH ADDITIONAL VESSEL  03/01/2020  . IR ANGIOGRAM SELECTIVE EACH ADDITIONAL VESSEL  03/01/2020  . IR ANGIOGRAM VISCERAL SELECTIVE  04/04/2019  . IR ANGIOGRAM VISCERAL SELECTIVE  05/16/2019  . IR ANGIOGRAM VISCERAL SELECTIVE  02/14/2020  . IR ANGIOGRAM VISCERAL SELECTIVE  02/14/2020  . IR ANGIOGRAM VISCERAL SELECTIVE  03/01/2020  . IR EMBO ARTERIAL NOT HEMORR HEMANG INC GUIDE ROADMAPPING  02/14/2020  . IR EMBO TUMOR ORGAN ISCHEMIA INFARCT INC GUIDE ROADMAPPING  04/04/2019  . IR EMBO TUMOR ORGAN ISCHEMIA INFARCT INC GUIDE ROADMAPPING  05/16/2019  .  IR EMBO TUMOR ORGAN ISCHEMIA INFARCT INC GUIDE ROADMAPPING  03/01/2020  . IR GENERIC HISTORICAL  02/26/2016   IR RADIOLOGIST EVAL & MGMT 02/26/2016 Markus Daft, MD [facility] INTERV RAD  . IR GENERIC HISTORICAL  01/16/2016   IR RADIOLOGIST EVAL & MGMT 01/16/2016 Markus Daft, MD [facility] INTERV RAD  . IR GENERIC HISTORICAL  06/24/2016   IR RADIOLOGIST EVAL & MGMT 06/24/2016 Aletta Edouard, MD [facility]  INTERV RAD  . IR RADIOLOGIST EVAL & MGMT  12/17/2016  . IR RADIOLOGIST EVAL & MGMT  06/16/2017  . IR RADIOLOGIST EVAL & MGMT  09/08/2017  . IR RADIOLOGIST EVAL & MGMT  10/26/2017  . IR RADIOLOGIST EVAL & MGMT  05/12/2018  . IR RADIOLOGIST EVAL & MGMT  07/26/2018  . IR RADIOLOGIST EVAL & MGMT  03/22/2019  . IR RADIOLOGIST EVAL & MGMT  04/11/2019  . IR RADIOLOGIST EVAL & MGMT  06/28/2019  . IR RADIOLOGIST EVAL & MGMT  10/10/2019  . IR RADIOLOGIST EVAL & MGMT  05/21/2020  . IR US GUIDE VASC ACCESS LEFT  05/16/2019  . IR US GUIDE VASC ACCESS RIGHT  04/04/2019  . IR US GUIDE VASC ACCESS RIGHT  02/14/2020  . IR US GUIDE VASC ACCESS RIGHT  03/01/2020  . POLYPECTOMY    . RADIOFREQUENCY ABLATION N/A 07/21/2017   Procedure: CT MICROWAVE THERMAL ABLATION-LIVER;  Surgeon: Markus Daft, MD;  Location: [facility];  Service: Anesthesiology;  Laterality: N/A;  . RADIOFREQUENCY ABLATION N/A 06/29/2018   Procedure: CT MICROWAVE THERMAL ABLATION;  Surgeon: Markus Daft, MD;  Location: [facility];  Service: Anesthesiology;  Laterality: N/A;    Allergies: Patient has no known allergies.  Medications: Prior to Admission medications   Medication Sig Start Date End Date Taking? Authorizing Provider  ACCU-CHEK AVIVA PLUS test strip  11/19/19   [provider]  Accu-Chek Softclix Lancets lancets  09/12/19   [provider]  ALPRAZolam Duanne Moron) 0.5 MG tablet Take 1 tablet (0.5 mg total) by mouth as directed. Take 2 tablets (1.0 mg total) prior to MR scan.  Make take an additional 0.5 mg po if needed. Patient not taking: Reported on 07/28/2019 10/26/17   Markus Daft, MD  amLODipine (NORVASC) 10 MG tablet Take 1 tablet (10 mg total) by mouth daily. 11/29/19   Brunetta Genera, MD  aspirin EC 81 MG tablet Take 81 mg by mouth every morning.     [provider]  cholecalciferol (VITAMIN D) 1000 UNITS tablet Take 1,000 Units by mouth every morning.     [provider]  Insulin Glargine (LANTUS SOLOSTAR) 100  UNIT/ML Solostar Pen Inject 32 Units into the skin daily.    [provider]  lisinopril-hydrochlorothiazide (ZESTORETIC) 20-12.5 MG tablet TAKE 2 TABLETS BY MOUTH DAILY WITH BREAKFAST. 05/17/20   Brunetta Genera, MD  metFORMIN (GLUCOPHAGE) 1000 MG tablet Take 1,000 mg by mouth 2 (two) times daily with a meal.    [provider]  Multiple Vitamin (MULTIVITAMIN WITH MINERALS) TABS tablet Take 1 tablet daily by mouth.    [provider]  omeprazole (PRILOSEC) 20 MG capsule Take 1 capsule (20 mg total) by mouth daily. 02/14/20 04/14/20  Candiss Norse A, PA-C  oxyCODONE (OXY IR/ROXICODONE) 5 MG immediate release tablet Take 1 tablet (5 mg total) by mouth every 6 (six) hours as needed for severe pain. 03/01/20   Brynda Greathouse Sue-Ellen, PA  PAZEO 0.7 % SOLN Place 1 drop into both eyes every morning.  05/09/18   [provider]  simvastatin (ZOCOR) 40 MG tablet Take 20 mg by mouth every evening.     [provider]     No family history on file.  Social History   Socioeconomic History  . Marital status: Married    Spouse name: Not on file  . Number of children: Not on file  . Years of education: Not on file  . Highest education level: Not on file  Occupational History  . Not on file  Tobacco Use  . Smoking status: Former Smoker    Quit date: 01/30/1994    Years since quitting: 26.3  . Smokeless tobacco: Never Used  . Tobacco comment: stopped 25 yrs ago  Vaping Use  . Vaping Use: Never used  Substance and Sexual Activity  . Alcohol use: No    Comment: stopped 30 years ago   . Drug use: No  . Sexual activity: Not Currently  Other Topics Concern  . Not on file  Social History Narrative  . Not on file   Social Determinants of Health   Financial Resource Strain:   . Difficulty of Paying Living Expenses: Not on file  Food Insecurity:   . Worried About Charity fundraiser in the Last Year: Not on file  . Ran Out of Food in the Last  Year: Not on file  Transportation Needs:   . Lack of Transportation (Medical): Not on file  . Lack of Transportation (Non-Medical): Not on file  Physical Activity:   . Days of Exercise per Week: Not on file  . Minutes of Exercise per Session: Not on file  Stress:   . Feeling of Stress : Not on file  Social Connections:   . Frequency of Communication with Friends and Family: Not on file  . Frequency of Social Gatherings with Friends and Family: Not on file  . Attends Religious Services: Not on file  . Active Member of Clubs or Organizations: Not on file  . Attends Archivist Meetings: Not on file  . Marital Status: Not on file      Review of Systems  Constitutional: Negative.   Respiratory: Negative.   Cardiovascular: Negative.   Gastrointestinal:       History of abdominal pain that prevented treatment to the right hepatic lobe few months ago.  That abdominal pain has resolved.     Physical Exam No direct physical exam was performed  Vital Signs: There were no vitals taken for this visit.  Imaging: MR ABDOMEN WWO CONTRAST  Result Date: 05/28/2020 CLINICAL DATA:  Hepatocellular carcinoma restaging, prior liver ablation and prior chemotherapy. EXAM: MRI ABDOMEN WITHOUT AND WITH CONTRAST TECHNIQUE: Multiplanar multisequence MR imaging of the abdomen was performed both before and after the administration of intravenous contrast. CONTRAST:  63mL GADAVIST GADOBUTROL 1 MMOL/ML IV SOLN COMPARISON:  Multiple exams, including 03/22/2020 CT scan. FINDINGS: Lower chest: 1.0 cm right pericardial space lymph node on image 43 of series 1001. Hepatobiliary: Multiple enhancing hepatic lesions compatible with multifocal hepatocellular carcinoma especially concentrated in segment 4 but also with involvement in the right hepatic lobe and lateral segment left hepatic lobe. Index lesion with rim enhancement, capsule, and target appearance measures 4.8 by 4.0 cm on image 48 of series 11001,  previously 3.0 by 3.0 cm on the CT of 03/22/2020; and previously measuring 3.5 by 3.2 cm on the MRI from 01/26/2020. There is a greater degree of internal and peripheral enhancement on today's exam compared to the prior exam is. A rim enhancing lesion  in the dome of the lateral segment left hepatic lobe compatible with hepatocellular carcinoma measures 3.4 by 2.6 cm on image 46 of series 11001. This was not readily appreciable on the CT from 03/22/2020, and on the CT from 01/26/2020 this lesion is highly indistinct but potentially measuring up to 1.8 by 1.2 cm. Overall the multiple hepatic lesions are enlarging. A dominant lesion in segment 4 measures up to about 10.1 by 10.2 cm on image 60 of series 11001, and has enlarged. This has a nonenhancing oval-shaped portion centrally which could be from necrosis or prior ablation. There is also a peripheral ablation region in segment 7 of the liver which has a small amount of central nodular enhancement on delayed images such image 50 of series 11003, local recurrence not excluded. No definite venous thrombosis in the liver. Layering sludge in the gallbladder. No biliary dilatation. Pancreas:  Unremarkable Spleen:  Unremarkable Adrenals/Urinary Tract:  Unremarkable Stomach/Bowel: Unremarkable Vascular/Lymphatic:  Unremarkable Other:  No supplemental non-categorized findings. Musculoskeletal: Unremarkable IMPRESSION: 1. Progressive multifocal hepatocellular carcinoma in the liver, with multiple enlarging lesions. 2. 1.0 cm right pericardial space lymph node, indeterminate. Electronically Signed   By: Van Clines M.D.   On: 05/28/2020 14:56   IR Radiologist Eval & Mgmt  Result Date: 05/21/2020 Please refer to notes tab for details about interventional procedure. (Op Note)   Labs:  CBC: Recent Labs    02/14/20 0855 03/01/20 0825 04/19/20 0832 05/08/20 0948  WBC 5.4 7.4 4.4 5.3  HGB 13.8 12.9* 11.1* 10.4*  HCT 46.1 42.4 36.7* 34.3*  PLT 244 428* 257  299    COAGS: Recent Labs    02/14/20 0855 03/01/20 0825 04/19/20 0832  INR 0.9 1.0 1.0  APTT 30  --   --     BMP: Recent Labs    02/16/20 0825 03/01/20 0825 04/19/20 0832 05/08/20 0948  NA 138 140 136 139  K 5.0 4.8 4.6 4.8  CL 101 102 99 102  CO2 28 25 25 28   GLUCOSE 159* 136* 167* 107*  BUN 25* 31* 27* 35*  CALCIUM 9.3 9.6 9.6 9.7  CREATININE 1.63* 1.65* 1.64* 2.08*  GFRNONAA 41* 41* 41* 31*  GFRAA 48* 47* 47* 36*    LIVER FUNCTION TESTS: Recent Labs    02/14/20 0855 03/01/20 0825 04/19/20 0832 05/08/20 0948  BILITOT 0.6 0.5 1.0 0.6  AST 47* 34 33 22  ALT 21 17 22 11   ALKPHOS 39 58 58 52  PROT 8.3* 8.7* 8.3* 8.0  ALBUMIN 4.5 4.0 4.3 3.8    TUMOR MARKERS: No results for input(s): AFPTM, CEA, CA199, CHROMGRNA in the last 8760 hours.  Assessment and Plan:  74 year old with multifocal hepatocellular carcinoma and has undergone both systemic and liver directed therapies.  Recent MRI demonstrates progression of disease.  I reviewed the MRI and large amount of the disease appears to be in the right hepatic artery distribution.  There is some disease involving the lateral segment of the left hepatic lobe.  I have been corresponding with Dr. Irene Limbo about combination of liver directed therapy and systemic therapy.  I would like to treat the largest burden disease which is in the right hepatic artery distribution.  Therefore, the patient has been scheduled for Y 90 radioembolization to the right hepatic lobe on Tuesday June 18, 2020.  Patient has undergone multiple catheter directed treatments to the liver at this point and most recently underwent Y 90 radioembolization to the left hepatic lobe.  Patient is very  familiar with the procedure and has no questions.  He did have significant fatigue after the last procedure that lasted approximately 3 weeks and I suspect he will have a similar outcome.  Fortunately, the patient had a very good chemical response to the last Y90  treatment based on his decreased AFP level.  Patient's liver enzymes remain within normal limits and I think he will tolerate another radioembolization procedure.  Patient understands that the treatments are palliative at this point and he will likely need additional liver directed therapy the future.    Plan for Y 90 radioembolization to the right hepatic lobe on 06/18/2020.  Thank you for this interesting consult.  I greatly enjoyed meeting Senay Sistrunk and look forward to participating in their care.  A copy of this report was sent to the requesting provider on this date.  Electronically Signed: Burman Riis 06/12/2020, 9:39 AM   I spent a total of    10 Minutes in remote  clinical consultation, greater than 50% of which was counseling/coordinating care for hepatocellular carcinoma.    Visit type: Audio only (telephone). Audio (no video) only due to Patient preference.. Alternative for in-person consultation at [facility], [address] [place], [place], [place]. This visit type was conducted due to national recommendations for restrictions regarding the COVID-19 Pandemic (e.g. social distancing).  This format is felt to be most appropriate for this patient at this time.  All issues noted in this document were discussed and addressed.  Patient ID: Linnie Mcglocklin, male   DOB: 01-31-1946, 74 y.o.   MRN: [id number]

## 2020-06-17 ENCOUNTER — Other Ambulatory Visit: Payer: Self-pay | Admitting: Radiology

## 2020-06-18 ENCOUNTER — Other Ambulatory Visit (HOSPITAL_COMMUNITY): Payer: Self-pay | Admitting: Diagnostic Radiology

## 2020-06-18 ENCOUNTER — Encounter (HOSPITAL_COMMUNITY)
Admission: RE | Admit: 2020-06-18 | Discharge: 2020-06-18 | Disposition: A | Discharge status: Home / Self Care | Payer: Medicare HMO | Source: Ambulatory Visit | Attending: Diagnostic Radiology | Admitting: Diagnostic Radiology

## 2020-06-18 ENCOUNTER — Other Ambulatory Visit (HOSPITAL_COMMUNITY): Payer: Medicare HMO

## 2020-06-18 ENCOUNTER — Encounter (HOSPITAL_COMMUNITY): Payer: Self-pay

## 2020-06-18 ENCOUNTER — Ambulatory Visit (HOSPITAL_COMMUNITY)
Admission: RE | Admit: 2020-06-18 | Discharge: 2020-06-18 | Disposition: A | Discharge status: Home / Self Care | Payer: Medicare HMO | Source: Ambulatory Visit | Attending: Diagnostic Radiology | Admitting: Diagnostic Radiology

## 2020-06-18 ENCOUNTER — Ambulatory Visit (HOSPITAL_COMMUNITY): Payer: Medicare HMO

## 2020-06-18 ENCOUNTER — Other Ambulatory Visit: Payer: Self-pay

## 2020-06-18 DIAGNOSIS — E785 Hyperlipidemia, unspecified: Secondary | ICD-10-CM | POA: Insufficient documentation

## 2020-06-18 DIAGNOSIS — Z794 Long term (current) use of insulin: Secondary | ICD-10-CM | POA: Insufficient documentation

## 2020-06-18 DIAGNOSIS — Z79899 Other long term (current) drug therapy: Secondary | ICD-10-CM | POA: Insufficient documentation

## 2020-06-18 DIAGNOSIS — C220 Liver cell carcinoma: Secondary | ICD-10-CM

## 2020-06-18 DIAGNOSIS — E119 Type 2 diabetes mellitus without complications: Secondary | ICD-10-CM | POA: Insufficient documentation

## 2020-06-18 DIAGNOSIS — I10 Essential (primary) hypertension: Secondary | ICD-10-CM | POA: Insufficient documentation

## 2020-06-18 DIAGNOSIS — C787 Secondary malignant neoplasm of liver and intrahepatic bile duct: Secondary | ICD-10-CM | POA: Diagnosis not present

## 2020-06-18 DIAGNOSIS — K7689 Other specified diseases of liver: Secondary | ICD-10-CM | POA: Diagnosis not present

## 2020-06-18 DIAGNOSIS — Z7984 Long term (current) use of oral hypoglycemic drugs: Secondary | ICD-10-CM | POA: Diagnosis not present

## 2020-06-18 DIAGNOSIS — Z7982 Long term (current) use of aspirin: Secondary | ICD-10-CM | POA: Insufficient documentation

## 2020-06-18 DIAGNOSIS — D509 Iron deficiency anemia, unspecified: Secondary | ICD-10-CM | POA: Insufficient documentation

## 2020-06-18 DIAGNOSIS — Z87891 Personal history of nicotine dependence: Secondary | ICD-10-CM | POA: Insufficient documentation

## 2020-06-18 HISTORY — PX: IR ANGIOGRAM SELECTIVE EACH ADDITIONAL VESSEL: IMG667

## 2020-06-18 HISTORY — PX: IR EMBO TUMOR ORGAN ISCHEMIA INFARCT INC GUIDE ROADMAPPING: IMG5449

## 2020-06-18 HISTORY — PX: IR ANGIOGRAM VISCERAL SELECTIVE: IMG657

## 2020-06-18 HISTORY — PX: IR US GUIDE VASC ACCESS RIGHT: IMG2390

## 2020-06-18 LAB — BASIC METABOLIC PANEL
Anion gap: 14 (ref 5–15)
BUN: 36 mg/dL — ABNORMAL HIGH (ref 8–23)
CO2: 23 mmol/L (ref 22–32)
Calcium: 9.8 mg/dL (ref 8.9–10.3)
Chloride: 103 mmol/L (ref 98–111)
Creatinine, Ser: 1.88 mg/dL — ABNORMAL HIGH (ref 0.61–1.24)
GFR, Estimated: 37 mL/min — ABNORMAL LOW (ref >60)
Glucose, Bld: 107 mg/dL — ABNORMAL HIGH (ref 70–99)
Potassium: 5 mmol/L (ref 3.5–5.1)
Sodium: 140 mmol/L (ref 135–145)

## 2020-06-18 LAB — BILIRUBIN, TOTAL: Total Bilirubin: 0.6 mg/dL (ref 0.3–1.2)

## 2020-06-18 LAB — CBC
HCT: 35.7 % — ABNORMAL LOW (ref 39.0–52.0)
Hemoglobin: 10.6 g/dL — ABNORMAL LOW (ref 13.0–17.0)
MCH: 22.4 pg — ABNORMAL LOW (ref 26.0–34.0)
MCHC: 29.7 g/dL — ABNORMAL LOW (ref 30.0–36.0)
MCV: 75.3 fL — ABNORMAL LOW (ref 80.0–100.0)
Platelets: 346 10*3/uL (ref 150–400)
RBC: 4.74 MIL/uL (ref 4.22–5.81)
RDW: 15.8 % — ABNORMAL HIGH (ref 11.5–15.5)
WBC: 5.3 10*3/uL (ref 4.0–10.5)
nRBC: 0 % (ref 0.0–0.2)

## 2020-06-18 LAB — PROTIME-INR
INR: 1 (ref 0.8–1.2)
Prothrombin Time: 12.6 seconds (ref 11.4–15.2)

## 2020-06-18 LAB — HEPATIC FUNCTION PANEL
ALT: 17 U/L (ref 0–44)
AST: 26 U/L (ref 15–41)
Albumin: 4 g/dL (ref 3.5–5.0)
Alkaline Phosphatase: 56 U/L (ref 38–126)
Bilirubin, Direct: 0.1 mg/dL (ref 0.0–0.2)
Indirect Bilirubin: 0.6 mg/dL (ref 0.3–0.9)
Total Bilirubin: 0.7 mg/dL (ref 0.3–1.2)
Total Protein: 8.2 g/dL — ABNORMAL HIGH (ref 6.5–8.1)

## 2020-06-18 LAB — TYPE AND SCREEN
ABO/RH(D): B POS
Antibody Screen: NEGATIVE

## 2020-06-18 LAB — APTT: aPTT: 35 seconds (ref 24–36)

## 2020-06-18 LAB — GLUCOSE, CAPILLARY: Glucose-Capillary: 93 mg/dL (ref 70–99)

## 2020-06-18 MED ORDER — LIDOCAINE HCL 1 % IJ SOLN
INTRAMUSCULAR | Status: AC | PRN
  Administered 2020-06-18: 10 mL via INTRADERMAL

## 2020-06-18 MED ORDER — IODIXANOL 320 MG/ML IV SOLN
50.0000 mL | Freq: Once | INTRAVENOUS | Status: AC | PRN
  Administered 2020-06-18: 1 mL via INTRA_ARTERIAL

## 2020-06-18 MED ORDER — PIPERACILLIN-TAZOBACTAM 3.375 G IVPB
INTRAVENOUS | Status: AC
  Filled 2020-06-18: qty 50

## 2020-06-18 MED ORDER — LIDOCAINE HCL 1 % IJ SOLN
INTRAMUSCULAR | Status: AC
  Filled 2020-06-18: qty 20

## 2020-06-18 MED ORDER — FENTANYL CITRATE (PF) 100 MCG/2ML IJ SOLN
INTRAMUSCULAR | Status: AC | PRN
Start: 2020-06-18 — End: 2020-06-18
  Administered 2020-06-18 (×2): 25 ug via INTRAVENOUS
  Administered 2020-06-18 (×2): 50 ug via INTRAVENOUS

## 2020-06-18 MED ORDER — IODIXANOL 320 MG/ML IV SOLN
50.0000 mL | Freq: Once | INTRAVENOUS | Status: AC | PRN
  Administered 2020-06-18: 50 mL via INTRA_ARTERIAL

## 2020-06-18 MED ORDER — OMEPRAZOLE 20 MG PO CPDR: 20.0000 mg | DELAYED_RELEASE_CAPSULE | Freq: Every day | ORAL | 0 refills | Status: DC

## 2020-06-18 MED ORDER — PANTOPRAZOLE SODIUM 40 MG IV SOLR
INTRAVENOUS | Status: AC
  Filled 2020-06-18: qty 40

## 2020-06-18 MED ORDER — MIDAZOLAM HCL 2 MG/2ML IJ SOLN
INTRAMUSCULAR | Status: AC | PRN
  Administered 2020-06-18: 1 mg via INTRAVENOUS
  Administered 2020-06-18: 0.5 mg via INTRAVENOUS
  Administered 2020-06-18: 2 mg via INTRAVENOUS

## 2020-06-18 MED ORDER — ONDANSETRON HCL 4 MG/2ML IJ SOLN
4.0000 mg | Freq: Once | INTRAMUSCULAR | Status: AC
  Administered 2020-06-18: 4 mg via INTRAVENOUS
  Filled 2020-06-18: qty 2

## 2020-06-18 MED ORDER — PIPERACILLIN-TAZOBACTAM 3.375 G IVPB
3.3750 g | Freq: Once | INTRAVENOUS | Status: AC
  Administered 2020-06-18: 3.375 g via INTRAVENOUS

## 2020-06-18 MED ORDER — IODIXANOL 320 MG/ML IV SOLN
50.0000 mL | Freq: Once | INTRAVENOUS | Status: AC | PRN
  Administered 2020-06-18: 10 mL via INTRA_ARTERIAL

## 2020-06-18 MED ORDER — PANTOPRAZOLE SODIUM 40 MG IV SOLR
40.0000 mg | Freq: Once | INTRAVENOUS | Status: AC
  Administered 2020-06-18: 40 mg via INTRAVENOUS

## 2020-06-18 MED ORDER — YTTRIUM 90 INJECTION
31.6700 | INJECTION | Freq: Once | INTRAVENOUS | Status: AC
  Administered 2020-06-18: 31.67 via INTRA_ARTERIAL

## 2020-06-18 MED ORDER — MIDAZOLAM HCL 2 MG/2ML IJ SOLN
INTRAMUSCULAR | Status: AC
  Filled 2020-06-18: qty 8

## 2020-06-18 MED ORDER — FENTANYL CITRATE (PF) 100 MCG/2ML IJ SOLN
INTRAMUSCULAR | Status: AC
  Filled 2020-06-18: qty 8

## 2020-06-18 MED ORDER — SODIUM CHLORIDE 0.9 % IV SOLN: INTRAVENOUS | Status: DC

## 2020-06-18 MED ORDER — DEXAMETHASONE SODIUM PHOSPHATE 10 MG/ML IJ SOLN
8.0000 mg | Freq: Once | INTRAMUSCULAR | Status: AC
  Administered 2020-06-18: 8 mg via INTRAVENOUS
  Filled 2020-06-18: qty 1

## 2020-06-18 NOTE — H&P (Signed)
Chief Complaint: Patient was seen in consultation today for Y90 embolization.  Referring Physician(s): [name]  Supervising Physician: Markus Daft  Patient Status: [facility] - Out-pt  History of Present Illness: Aaron [name] is a 74 y.o. male with a past medical history significant for DM II, HLD, HTN, anemia, hepatitis C and [place] diagnosed 2015 with previous history of DEB-TACE 03/13/2014 and8/11/20, thermal ablation 06/01/2014, 02/10/2016,07/21/2017, 06/29/18, bland hepatic embolization 05/16/19, Y90 radioembolization of the left hepatic lobe 03/01/20 all of which were performed by Dr. Anselm Pancoast who presents today for Y90 radioembolization of the right hepatic lobe. Aaron [name] was planned to undergo Y90 of the right hepatic lobe in August of this year, however he experienced significant abdominal pain and the procedure was postponed. He underwent a MRI on 10/5 which showed residual disease that appeared to be predominantly in the right hepatic artery distribution. He was seen in follow up with Dr. Anselm Pancoast on 10/20 and decision was made to proceed with Y90 radioembolization of the right hepatic lobe.  Aaron [name] denies any complaints currently, he reports feeling well the first week after his last procedure but then feeling very tired with little appetite for almost 3 weeks after that. He is hopeful that because he knows what to expect this time that he won't have such a rough recovery. He understands the procedure today and is agreeable to proceed as planned.  Past Medical History:  Diagnosis Date  . Arthritis   . Diabetes mellitus without complication ([place])    type II   . Elevated liver enzymes   . Fatty liver   . Hepatitis C    genotype 1A status post treatment with Viekira and Ribavarin for 24 weeks  . Hepatocellular carcinoma ([place]) 01/30/2014   Path  . History of colon polyps   . Hyperlipidemia   . Hypertension   . Iron deficiency anemia    hx of   . MGUS (monoclonal gammopathy of  unknown significance)     Past Surgical History:  Procedure Laterality Date  . COLONOSCOPY    . ESOPHAGOGASTRODUODENOSCOPY  2016  . IR ANGIOGRAM SELECTIVE EACH ADDITIONAL VESSEL  04/04/2019  . IR ANGIOGRAM SELECTIVE EACH ADDITIONAL VESSEL  04/04/2019  . IR ANGIOGRAM SELECTIVE EACH ADDITIONAL VESSEL  04/04/2019  . IR ANGIOGRAM SELECTIVE EACH ADDITIONAL VESSEL  04/04/2019  . IR ANGIOGRAM SELECTIVE EACH ADDITIONAL VESSEL  04/04/2019  . IR ANGIOGRAM SELECTIVE EACH ADDITIONAL VESSEL  04/04/2019  . IR ANGIOGRAM SELECTIVE EACH ADDITIONAL VESSEL  04/04/2019  . IR ANGIOGRAM SELECTIVE EACH ADDITIONAL VESSEL  05/16/2019  . IR ANGIOGRAM SELECTIVE EACH ADDITIONAL VESSEL  05/16/2019  . IR ANGIOGRAM SELECTIVE EACH ADDITIONAL VESSEL  05/16/2019  . IR ANGIOGRAM SELECTIVE EACH ADDITIONAL VESSEL  02/14/2020  . IR ANGIOGRAM SELECTIVE EACH ADDITIONAL VESSEL  02/14/2020  . IR ANGIOGRAM SELECTIVE EACH ADDITIONAL VESSEL  02/14/2020  . IR ANGIOGRAM SELECTIVE EACH ADDITIONAL VESSEL  02/14/2020  . IR ANGIOGRAM SELECTIVE EACH ADDITIONAL VESSEL  03/01/2020  . IR ANGIOGRAM SELECTIVE EACH ADDITIONAL VESSEL  03/01/2020  . IR ANGIOGRAM SELECTIVE EACH ADDITIONAL VESSEL  03/01/2020  . IR ANGIOGRAM SELECTIVE EACH ADDITIONAL VESSEL  03/01/2020  . IR ANGIOGRAM SELECTIVE EACH ADDITIONAL VESSEL  03/01/2020  . IR ANGIOGRAM VISCERAL SELECTIVE  04/04/2019  . IR ANGIOGRAM VISCERAL SELECTIVE  05/16/2019  . IR ANGIOGRAM VISCERAL SELECTIVE  02/14/2020  . IR ANGIOGRAM VISCERAL SELECTIVE  02/14/2020  . IR ANGIOGRAM VISCERAL SELECTIVE  03/01/2020  . IR EMBO ARTERIAL NOT [place]  02/14/2020  . IR EMBO TUMOR ORGAN ISCHEMIA INFARCT INC GUIDE ROADMAPPING  04/04/2019  . IR EMBO TUMOR ORGAN ISCHEMIA INFARCT INC GUIDE ROADMAPPING  05/16/2019  . IR EMBO TUMOR ORGAN ISCHEMIA INFARCT INC GUIDE ROADMAPPING  03/01/2020  . IR GENERIC HISTORICAL  02/26/2016   IR RADIOLOGIST EVAL & MGMT 02/26/2016 Markus Daft, MD [facility] INTERV RAD  . IR GENERIC HISTORICAL   01/16/2016   IR RADIOLOGIST EVAL & MGMT 01/16/2016 Markus Daft, MD [facility] INTERV RAD  . IR GENERIC HISTORICAL  06/24/2016   IR RADIOLOGIST EVAL & MGMT 06/24/2016 Aletta Edouard, MD [facility] INTERV RAD  . IR RADIOLOGIST EVAL & MGMT  12/17/2016  . IR RADIOLOGIST EVAL & MGMT  06/16/2017  . IR RADIOLOGIST EVAL & MGMT  09/08/2017  . IR RADIOLOGIST EVAL & MGMT  10/26/2017  . IR RADIOLOGIST EVAL & MGMT  05/12/2018  . IR RADIOLOGIST EVAL & MGMT  07/26/2018  . IR RADIOLOGIST EVAL & MGMT  03/22/2019  . IR RADIOLOGIST EVAL & MGMT  04/11/2019  . IR RADIOLOGIST EVAL & MGMT  06/28/2019  . IR RADIOLOGIST EVAL & MGMT  10/10/2019  . IR RADIOLOGIST EVAL & MGMT  05/21/2020  . IR RADIOLOGIST EVAL & MGMT  06/12/2020  . IR US GUIDE VASC ACCESS LEFT  05/16/2019  . IR US GUIDE VASC ACCESS RIGHT  04/04/2019  . IR US GUIDE VASC ACCESS RIGHT  02/14/2020  . IR US GUIDE VASC ACCESS RIGHT  03/01/2020  . POLYPECTOMY    . RADIOFREQUENCY ABLATION N/A 07/21/2017   Procedure: CT MICROWAVE THERMAL ABLATION-LIVER;  Surgeon: Markus Daft, MD;  Location: [facility];  Service: Anesthesiology;  Laterality: N/A;  . RADIOFREQUENCY ABLATION N/A 06/29/2018   Procedure: CT MICROWAVE THERMAL ABLATION;  Surgeon: Markus Daft, MD;  Location: [facility];  Service: Anesthesiology;  Laterality: N/A;    Allergies: Patient has no known allergies.  Medications: Prior to Admission medications   Medication Sig Start Date End Date Taking? Authorizing Provider  amLODipine (NORVASC) 10 MG tablet Take 1 tablet (10 mg total) by mouth daily. 11/29/19  Yes Brunetta Genera, MD  aspirin EC 81 MG tablet Take 81 mg by mouth every morning.    Yes [provider]  cholecalciferol (VITAMIN D) 1000 UNITS tablet Take 1,000 Units by mouth every morning.    Yes [provider]  Insulin Glargine (LANTUS SOLOSTAR) 100 UNIT/ML Solostar Pen Inject 32 Units into the skin daily.   Yes [provider]  lisinopril-hydrochlorothiazide (ZESTORETIC) 20-12.5 MG tablet  TAKE 2 TABLETS BY MOUTH DAILY WITH BREAKFAST. 05/17/20  Yes Brunetta Genera, MD  metFORMIN (GLUCOPHAGE) 1000 MG tablet Take 1,000 mg by mouth 2 (two) times daily with a meal.   Yes [provider]  Multiple Vitamin (MULTIVITAMIN WITH MINERALS) TABS tablet Take 1 tablet daily by mouth.   Yes [provider]  oxyCODONE (OXY IR/ROXICODONE) 5 MG immediate release tablet Take 1 tablet (5 mg total) by mouth every 6 (six) hours as needed for severe pain. 03/01/20  Yes Docia Barrier, PA  simvastatin (ZOCOR) 40 MG tablet Take 20 mg by mouth every evening.    Yes [provider]  ACCU-CHEK AVIVA PLUS test strip  11/19/19   [provider]  Accu-Chek Softclix Lancets lancets  09/12/19   [provider]  ALPRAZolam Duanne Moron) 0.5 MG tablet Take 1 tablet (0.5 mg total) by mouth as directed. Take 2 tablets (1.0 mg total) prior to MR scan.  Make take an additional 0.5  mg po if needed. Patient not taking: Reported on 07/28/2019 10/26/17   Markus Daft, MD  omeprazole (PRILOSEC) 20 MG capsule Take 1 capsule (20 mg total) by mouth daily. 02/14/20 04/14/20  Candiss Norse A, PA-C  PAZEO 0.7 % SOLN Place 1 drop into both eyes every morning.  05/09/18   [provider]     History reviewed. No pertinent family history.  Social History   Socioeconomic History  . Marital status: Married    Spouse name: Not on file  . Number of children: Not on file  . Years of education: Not on file  . Highest education level: Not on file  Occupational History  . Not on file  Tobacco Use  . Smoking status: Former Smoker    Quit date: 01/30/1994    Years since quitting: 26.4  . Smokeless tobacco: Never Used  . Tobacco comment: stopped 25 yrs ago  Vaping Use  . Vaping Use: Never used  Substance and Sexual Activity  . Alcohol use: No    Comment: stopped 30 years ago   . Drug use: No  . Sexual activity: Not Currently  Other Topics Concern  . Not on file  Social  History Narrative  . Not on file   Social Determinants of Health   Financial Resource Strain:   . Difficulty of Paying Living Expenses: Not on file  Food Insecurity:   . Worried About Charity fundraiser in the Last Year: Not on file  . Ran Out of Food in the Last Year: Not on file  Transportation Needs:   . Lack of Transportation (Medical): Not on file  . Lack of Transportation (Non-Medical): Not on file  Physical Activity:   . Days of Exercise per Week: Not on file  . Minutes of Exercise per Session: Not on file  Stress:   . Feeling of Stress : Not on file  Social Connections:   . Frequency of Communication with Friends and Family: Not on file  . Frequency of Social Gatherings with Friends and Family: Not on file  . Attends Religious Services: Not on file  . Active Member of Clubs or Organizations: Not on file  . Attends Archivist Meetings: Not on file  . Marital Status: Not on file     Review of Systems: A 12 point ROS discussed and pertinent positives are indicated in the HPI above.  All other systems are negative.  Review of Systems  Constitutional: Negative for chills, fatigue and fever.  Respiratory: Negative for cough and shortness of breath.   Cardiovascular: Negative for chest pain.  Gastrointestinal: Negative for abdominal pain, diarrhea, nausea and vomiting.  Musculoskeletal: Negative for back pain.  Neurological: Negative for dizziness and headaches.    Vital Signs: BP (!) 162/75   Pulse 72   Temp 98.2 F (36.8 C) (Oral)   Resp 18   SpO2 100%   Physical Exam Vitals reviewed.  HENT:     Head: Normocephalic.     Mouth/Throat:     Mouth: Mucous membranes are moist.     Pharynx: Oropharynx is clear. No oropharyngeal exudate or posterior oropharyngeal erythema.  Eyes:     General: No scleral icterus. Cardiovascular:     Rate and Rhythm: Normal rate and regular rhythm.  Pulmonary:     Effort: Pulmonary effort is normal.     Breath sounds:  Normal breath sounds.  Abdominal:     General: Bowel sounds are normal. There is no distension.  Palpations: Abdomen is soft.     Tenderness: There is no abdominal tenderness.  Skin:    General: Skin is warm and dry.     Coloration: Skin is not jaundiced.  Neurological:     Mental Status: He is alert and oriented to person, place, and time.  Psychiatric:        Mood and Affect: Mood normal.        Behavior: Behavior normal.        Thought Content: Thought content normal.        Judgment: Judgment normal.      MD Evaluation Airway: WNL Heart: WNL Abdomen: WNL Chest/ Lungs: WNL ASA  Classification: 3 Mallampati/Airway Score: Two   Imaging: MR ABDOMEN WWO CONTRAST  Result Date: 05/28/2020 CLINICAL DATA:  Hepatocellular carcinoma restaging, prior liver ablation and prior chemotherapy. EXAM: MRI ABDOMEN WITHOUT AND WITH CONTRAST TECHNIQUE: Multiplanar multisequence MR imaging of the abdomen was performed both before and after the administration of intravenous contrast. CONTRAST:  66mL GADAVIST GADOBUTROL 1 MMOL/ML IV SOLN COMPARISON:  Multiple exams, including 03/22/2020 CT scan. FINDINGS: Lower chest: 1.0 cm right pericardial space lymph node on image 43 of series 1001. Hepatobiliary: Multiple enhancing hepatic lesions compatible with multifocal hepatocellular carcinoma especially concentrated in segment 4 but also with involvement in the right hepatic lobe and lateral segment left hepatic lobe. Index lesion with rim enhancement, capsule, and target appearance measures 4.8 by 4.0 cm on image 48 of series 11001, previously 3.0 by 3.0 cm on the CT of 03/22/2020; and previously measuring 3.5 by 3.2 cm on the MRI from 01/26/2020. There is a greater degree of internal and peripheral enhancement on today's exam compared to the prior exam is. A rim enhancing lesion in the dome of the lateral segment left hepatic lobe compatible with hepatocellular carcinoma measures 3.4 by 2.6 cm on image 46 of  series 11001. This was not readily appreciable on the CT from 03/22/2020, and on the CT from 01/26/2020 this lesion is highly indistinct but potentially measuring up to 1.8 by 1.2 cm. Overall the multiple hepatic lesions are enlarging. A dominant lesion in segment 4 measures up to about 10.1 by 10.2 cm on image 60 of series 11001, and has enlarged. This has a nonenhancing oval-shaped portion centrally which could be from necrosis or prior ablation. There is also a peripheral ablation region in segment 7 of the liver which has a small amount of central nodular enhancement on delayed images such image 50 of series 11003, local recurrence not excluded. No definite venous thrombosis in the liver. Layering sludge in the gallbladder. No biliary dilatation. Pancreas:  Unremarkable Spleen:  Unremarkable Adrenals/Urinary Tract:  Unremarkable Stomach/Bowel: Unremarkable Vascular/Lymphatic:  Unremarkable Other:  No supplemental non-categorized findings. Musculoskeletal: Unremarkable IMPRESSION: 1. Progressive multifocal hepatocellular carcinoma in the liver, with multiple enlarging lesions. 2. 1.0 cm right pericardial space lymph node, indeterminate. Electronically Signed   By: Van Clines M.D.   On: 05/28/2020 14:56   IR Radiologist Eval & Mgmt  Result Date: 06/12/2020 Please refer to notes tab for details about interventional procedure. (Op Note)  IR Radiologist Eval & Mgmt  Result Date: 05/21/2020 Please refer to notes tab for details about interventional procedure. (Op Note)   Labs:  CBC: Recent Labs    02/14/20 0855 03/01/20 0825 04/19/20 0832 05/08/20 0948  WBC 5.4 7.4 4.4 5.3  HGB 13.8 12.9* 11.1* 10.4*  HCT 46.1 42.4 36.7* 34.3*  PLT 244 428* 257 299    COAGS: Recent  Labs    02/14/20 0855 03/01/20 0825 04/19/20 0832  INR 0.9 1.0 1.0  APTT 30  --   --     BMP: Recent Labs    02/16/20 0825 03/01/20 0825 04/19/20 0832 05/08/20 0948  NA 138 140 136 139  K 5.0 4.8 4.6 4.8   CL 101 102 99 102  CO2 28 25 25 28   GLUCOSE 159* 136* 167* 107*  BUN 25* 31* 27* 35*  CALCIUM 9.3 9.6 9.6 9.7  CREATININE 1.63* 1.65* 1.64* 2.08*  GFRNONAA 41* 41* 41* 31*  GFRAA 48* 47* 47* 36*    LIVER FUNCTION TESTS: Recent Labs    02/14/20 0855 03/01/20 0825 04/19/20 0832 05/08/20 0948  BILITOT 0.6 0.5 1.0 0.6  AST 47* 34 33 22  ALT 21 17 22 11   ALKPHOS 39 58 58 52  PROT 8.3* 8.7* 8.3* 8.0  ALBUMIN 4.5 4.0 4.3 3.8    TUMOR MARKERS: No results for input(s): AFPTM, CEA, CA199, CHROMGRNA in the last 8760 hours.  Assessment and Plan:  74 y/o M with history of HCC s/p multiple targeted liver therapies in IR who presents today for Y90 radioembolization of the right hepatic lobe as previously discussed with Dr. Anselm Pancoast.  Patient has been NPO since midnight, no current anticoagulation/antiplatelet therapy. Afebrile, pre-procedure labs pending and will be reviewed prior to proceeding.  Risks and benefits discussed with the patient including, but not limited to bleeding, infection, vascular injury, post procedural pain, nausea, vomiting and fatigue, contrast induced renal failure, liver failure, radiation injury to the bowel, radiation induced cholecystitis, neutropenia and possible need for additional procedures.  All of the patient's questions were answered, patient is agreeable to proceed.  Consent signed and in chart.  Thank you for this interesting consult.  I greatly enjoyed meeting Aaron [name] and look forward to participating in their care.  A copy of this report was sent to the requesting provider on this date.  Electronically Signed: Joaquim Nam, PA-C 06/18/2020, 8:45 AM   I spent a total of  25 Minutes in face to face in clinical consultation, greater than 50% of which was counseling/coordinating care for Y90 embolization.

## 2020-06-18 NOTE — Progress Notes (Signed)
Patient seen with Dr. Anselm Pancoast ~30 minutes prior to planned d/c post successful Y90 radioembolization today in IR.  Patient denies any pain, n/v or other concerns. He has been tolerating PO intake without issue. He has not ambulated yet.  Peripheral pulses palpable, right CFA puncture site soft and nontender without active bleeding.   Reviewed radiation safety precautions with patient (also given in written form on AVS) and that eRx for Prilosec 20 mg PO QD x 30 days was sent in to his pharmacy today. We will follow up with him in about 2 weeks to see how he is doing. All questions answered, patient feels ready for d/c home. He was encouraged to call with any questions or concerns.  Candiss Norse, PA-C

## 2020-06-18 NOTE — Procedures (Signed)
Interventional Radiology Procedure:   Indications: Multifocal hepatocellular carcinoma  Procedure: Visceral angiogram with Y90 radioembolization to right hepaticartery distribution.   Physicians:  Ruthann Cancer and Markus Daft  Findings: Hypervascular lesions in right hepatic lobe.  Antegrade flow in right hepatic artery after delivery of the dose. Right groin closure with AngioSeal.    Complications: None     EBL: Minimal, less than 10 ml  Plan: Immediate follow up imaging in Nuc [facility] and bedrest 4 hours.  Discharge to home today.  Radiation safety precautions at home.     Domingos Riggi R. Anselm Pancoast, MD  Pager: [phone]

## 2020-06-18 NOTE — Discharge Instructions (Addendum)
Moderate Conscious Sedation, Adult, Care After These instructions provide you with information about caring for yourself after your procedure. Your health care provider may also give you more specific instructions. Your treatment has been planned according to current medical practices, but problems sometimes occur. Call your health care provider if you have any problems or questions after your procedure. What can I expect after the procedure? After your procedure, it is common:  To feel sleepy for several hours.  To feel clumsy and have poor balance for several hours.  To have poor judgment for several hours.  To vomit if you eat too soon. Follow these instructions at home: For at least 24 hours after the procedure:  Do not: ? Participate in activities where you could fall or become injured. ? Drive. ? Use heavy machinery. ? Drink alcohol. ? Take sleeping pills or medicines that cause drowsiness. ? Make important decisions or sign legal documents. ? Take care of children on your own.  Rest. Eating and drinking  Follow the diet recommended by your health care provider.  If you vomit: ? Drink water, juice, or soup when you can drink without vomiting. ? Make sure you have little or no nausea before eating solid foods. General instructions  Have a responsible adult stay with you until you are awake and alert.  Take over-the-counter and prescription medicines only as told by your health care provider.  If you smoke, do not smoke without supervision.  Keep all follow-up visits as told by your health care provider. This is important. Contact a health care provider if:  You keep feeling nauseous or you keep vomiting.  You feel light-headed.  You develop a rash.  You have a fever. Get help right away if:  You have trouble breathing. This information is not intended to replace advice given to you by your health care provider. Make sure you discuss any questions you have  with your health care provider. Document Revised: 07/23/2017 Document Reviewed: 11/30/2015 Elsevier Patient Education  [place] Y-90 Radioembolization Discharge Instructions  You have been given a radioactive material during your procedure.  While it is safe for you to be discharged home from the hospital, you need to proceed directly home.    Do not use public transportation, including air travel, lasting more than 2 hours for 1 week.  Avoid crowded public places for 1 week.  Adult visitors should try to avoid close contact with you for 1 week.    Children and pregnant females should not visit or have close contact with you for 1 week.  Items that you touch are not radioactive.  Do not sleep in the same bed as your partner for 1 week, and a condom should be used for sexual activity during the first 24 hours.  Your blood may be radioactive and caution should be used if any bleeding occurs during the recovery period.  Body fluids may be radioactive for 24 hours.  Wash your hands after voiding.  Men should sit to urinate.  Dispose of any soiled materials (flush down toilet or place in trash at home) during the first day.  Drink 6 to 8 glasses of fluids per day for 5 days to hydrate yourself.  If you need to see a doctor during the first week, you must let them know that you were treated with yttrium-90 microspheres, and will be slightly radioactive.  They can call Interventional Radiology [phone] with any questions.  Urgent needs - IR on  call MD [phone]  Wound - May remove dressing and shower in 24 to 48 hours.  Keep site clean and dry.  Replace with bandaid. Do not submerge in tub or water until site healing well.   Femoral Site Care (right Groin) This sheet gives you information about how to care for yourself after your procedure. Your health care provider may also give you more specific instructions. If you have problems or questions, contact your health care  provider. What can I expect after the procedure? After the procedure, it is common to have:  Bruising that usually fades within 1-2 weeks.  Tenderness at the site. Follow these instructions at home: Wound care  Follow instructions from your health care provider about how to take care of your insertion site. Make sure you: ? Wash your hands with soap and water before you change your bandage (dressing). If soap and water are not available, use hand sanitizer. ? Change your dressing as told by your health care provider. ? Leave stitches (sutures), skin glue, or adhesive strips in place. These skin closures may need to stay in place for 2 weeks or longer. If adhesive strip edges start to loosen and curl up, you may trim the loose edges. Do not remove adhesive strips completely unless your health care provider tells you to do that.  Do not take baths, swim, or use a hot tub until your health care provider approves.  You may shower 24-48 hours after the procedure or as told by your health care provider. ? Gently wash the site with plain soap and water. ? Pat the area dry with a clean towel. ? Do not rub the site. This may cause bleeding.  Do not apply powder or lotion to the site. Keep the site clean and dry.  Check your femoral site every day for signs of infection. Check for: ? Redness, swelling, or pain. ? Fluid or blood. ? Warmth. ? Pus or a bad smell. Activity  For the first 2-3 days after your procedure, or as long as directed: ? Avoid climbing stairs as much as possible. ? Do not squat.  Do not lift anything that is heavier than 10 lb (4.5 kg), or the limit that you are told, until your health care provider says that it is safe.  Rest as directed. ? Avoid sitting for a long time without moving. Get up to take short walks every 1-2 hours.  Do not drive for 24 hours if you were given a medicine to help you relax (sedative). General instructions  Take over-the-counter and  prescription medicines only as told by your health care provider.  Keep all follow-up visits as told by your health care provider. This is important. Contact a health care provider if you have:  A fever or chills.  You have redness, swelling, or pain around your insertion site. Get help right away if:  The catheter insertion area swells very fast.  You pass out.  You suddenly start to sweat or your skin gets clammy.  The catheter insertion area is bleeding, and the bleeding does not stop when you hold steady pressure on the area.  The area near or just beyond the catheter insertion site becomes pale, cool, tingly, or numb. These symptoms may represent a serious problem that is an emergency. Do not wait to see if the symptoms will go away. Get medical help right away. Call your local emergency services (911 in the [place] Do not drive yourself to the hospital. Summary  After the procedure, it is common to have bruising that usually fades within 1-2 weeks.  Check your femoral site every day for signs of infection.  Do not lift anything that is heavier than 10 lb (4.5 kg), or the limit that you are told, until your health care provider says that it is safe. This information is not intended to replace advice given to you by your health care provider. Make sure you discuss any questions you have with your health care provider. Document Revised: 08/23/2017 Document Reviewed: 08/23/2017 Elsevier Patient Education  2020 [employer].     I

## 2020-06-19 ENCOUNTER — Other Ambulatory Visit: Payer: Self-pay | Admitting: Hematology

## 2020-06-19 LAB — AFP TUMOR MARKER: AFP, Serum, Tumor Marker: 46.8 ng/mL — ABNORMAL HIGH (ref 0.0–8.3)

## 2020-06-19 LAB — GLUCOSE, CAPILLARY: Glucose-Capillary: 102 mg/dL — ABNORMAL HIGH (ref 70–99)

## 2020-07-16 DIAGNOSIS — E119 Type 2 diabetes mellitus without complications: Secondary | ICD-10-CM | POA: Diagnosis not present

## 2020-07-16 DIAGNOSIS — E1165 Type 2 diabetes mellitus with hyperglycemia: Secondary | ICD-10-CM | POA: Diagnosis not present

## 2020-07-16 DIAGNOSIS — I10 Essential (primary) hypertension: Secondary | ICD-10-CM | POA: Diagnosis not present

## 2020-07-16 DIAGNOSIS — E7800 Pure hypercholesterolemia, unspecified: Secondary | ICD-10-CM | POA: Diagnosis not present

## 2020-07-22 ENCOUNTER — Other Ambulatory Visit: Payer: Self-pay | Admitting: *Deleted

## 2020-07-22 DIAGNOSIS — Z794 Long term (current) use of insulin: Secondary | ICD-10-CM | POA: Diagnosis not present

## 2020-07-22 DIAGNOSIS — Z0000 Encounter for general adult medical examination without abnormal findings: Secondary | ICD-10-CM | POA: Diagnosis not present

## 2020-07-22 DIAGNOSIS — C229 Malignant neoplasm of liver, not specified as primary or secondary: Secondary | ICD-10-CM | POA: Diagnosis not present

## 2020-07-22 DIAGNOSIS — E119 Type 2 diabetes mellitus without complications: Secondary | ICD-10-CM | POA: Diagnosis not present

## 2020-07-22 DIAGNOSIS — D472 Monoclonal gammopathy: Secondary | ICD-10-CM | POA: Diagnosis not present

## 2020-07-22 DIAGNOSIS — M25512 Pain in left shoulder: Secondary | ICD-10-CM | POA: Diagnosis not present

## 2020-07-22 DIAGNOSIS — E7800 Pure hypercholesterolemia, unspecified: Secondary | ICD-10-CM | POA: Diagnosis not present

## 2020-07-22 DIAGNOSIS — C220 Liver cell carcinoma: Secondary | ICD-10-CM

## 2020-07-22 DIAGNOSIS — I10 Essential (primary) hypertension: Secondary | ICD-10-CM | POA: Diagnosis not present

## 2020-07-23 ENCOUNTER — Telehealth: Payer: Self-pay | Admitting: Hematology

## 2020-07-23 NOTE — Telephone Encounter (Signed)
Scheduled appt per 11/29 sch msg - pt is aware of appt date and time   

## 2020-07-23 NOTE — Progress Notes (Signed)
HEMATOLOGY/ONCOLOGY CLINIC NOTE  Date of Service: 07/24/2020  Patient Care Team: Jani Gravel, MD as PCP - General (Internal Medicine)  CHIEF COMPLAINTS/PURPOSE OF CONSULTATION:   [facility]- progression,toxicity check with avastin + atezolizumab  HISTORY OF PRESENTING ILLNESS:   Aaron [name] is a wonderful 74 y.o. male who has been referred to [place] by Dr Jani Gravel for evaluation and management of monoclonal gammopathy of undetermined significance.  Patient has history of hypertension, diabetes, dyslipidemia, hepatocellular carcinoma (rx with TACE and percutaneous thermal ablation), hepatitis C status post treatment, iron deficiency anemia in 2014 treated with oral iron.  Patient had an SPEP done by his primary care physician on 10/04/2015 that showed an M spike of 0.3 g/dL. It was presumably will be done due to his complaints of fatigue. He has had no significant anemia. No new bone pains. No fevers/chills/drenching night sweats.  No new anemia..  Outside labs show hemoglobin of 12.4 with microcytosis with an MCV of 70.5, normal WBC count of 5.1k.  No evidence of hypercalcemia or significant renal failure on his outside labs.   INTERVAL HISTORY:  Aaron [name]  is here for his scheduled follow-up for MGUS and HCC. The patient's last visit with [place] was on 05/08/2020. The pt reports that he is doing well overall.  The pt reports less nausea and abdominal pain with his latest radioembolization treatment. Pt does not have a follow-up MRI Abd scheduled with Dr. Anselm Pancoast at this time. He is having left shoulder discomfort when raising his arm.  Lab results today (07/24/20) of CBC w/diff and CMP is as follows: all values are WNL except for Hgb at 10.9, HCT at 35.5, MCV at 72.0, MCH at 22.1, RDW at 15.6, Glucose at 159, BUN at 40, Creatinine at 2.13, Total Protein at 8.2, GFR Est at 32. 07/24/2020 Total Protein Ur is "Negative" 07/24/2020 AFP tumor marker is in progress 07/24/2020 TSH is in progress    On review of systems, pt reports left shoulder pain and denies nausea, abdominal pain, diarrhea, constipation, leg swelling, low appetite and any other symptoms.   MEDICAL HISTORY:  Past Medical History:  Diagnosis Date  . Arthritis   . Diabetes mellitus without complication ([place])    type II   . Elevated liver enzymes   . Fatty liver   . Hepatitis C    genotype 1A status post treatment with Viekira and Ribavarin for 24 weeks  . Hepatocellular carcinoma ([place]) 01/30/2014   Path  . History of colon polyps   . Hyperlipidemia   . Hypertension   . Iron deficiency anemia    hx of   . MGUS (monoclonal gammopathy of unknown significance)    Obesity Hepatocellular carcinoma treated with TACE and percutaneous thermal ablation by interventional radiology. Last MRI on 08/06/2015 showed slight decrease in the size of the ablation defect involving the right lobe of the liver area and no findings to suggest residual or recurrent hepatocellular carcinoma. Mild changes of liver cirrhosis. MRI Abd 06/24/2016- no evidence of recurrent HCC   Hepatitis C genotype 1A status post treatment with Viekira and Ribavarin for 24 weeks.  Monoclonal gammopathy of undetermined significance.  SURGICAL HISTORY:  Status post microwave ablation[October 2015] of liver lesion and TACE [July 2015] for [place]. EGD and colonoscopy in 2016   SOCIAL HISTORY: Social History   Socioeconomic History  . Marital status: Married    Spouse name: Not on file  . Number of children: Not on file  . Years of  education: Not on file  . Highest education level: Not on file  Occupational History  . Not on file  Tobacco Use  . Smoking status: Former Smoker    Quit date: 01/30/1994    Years since quitting: 26.4  . Smokeless tobacco: Never Used  . Tobacco comment: stopped 25 yrs ago  Vaping Use  . Vaping Use: Never used  Substance and Sexual Activity  . Alcohol use: No    Comment: stopped 30 years ago   . Drug use: No  .  Sexual activity: Not Currently  Other Topics Concern  . Not on file  Social History Narrative  . Not on file   Social Determinants of Health   Financial Resource Strain:   . Difficulty of Paying Living Expenses: Not on file  Food Insecurity:   . Worried About Charity fundraiser in the Last Year: Not on file  . Ran Out of Food in the Last Year: Not on file  Transportation Needs:   . Lack of Transportation (Medical): Not on file  . Lack of Transportation (Non-Medical): Not on file  Physical Activity:   . Days of Exercise per Week: Not on file  . Minutes of Exercise per Session: Not on file  Stress:   . Feeling of Stress : Not on file  Social Connections:   . Frequency of Communication with Friends and Family: Not on file  . Frequency of Social Gatherings with Friends and Family: Not on file  . Attends Religious Services: Not on file  . Active Member of Clubs or Organizations: Not on file  . Attends Archivist Meetings: Not on file  . Marital Status: Not on file  Intimate Partner Violence:   . Fear of Current or Ex-Partner: Not on file  . Emotionally Abused: Not on file  . Physically Abused: Not on file  . Sexually Abused: Not on file  Former smoker and smoked 1 pack per day for about 20 years starting at age 57 quit 85 years ago.  FAMILY HISTORY: No family history on file.  ALLERGIES:  has No Known Allergies.  MEDICATIONS:  Current Outpatient Medications  Medication Sig Dispense Refill  . ACCU-CHEK AVIVA PLUS test strip     . Accu-Chek Softclix Lancets lancets     . ALPRAZolam (XANAX) 0.5 MG tablet Take 1 tablet (0.5 mg total) by mouth as directed. Take 2 tablets (1.0 mg total) prior to MR scan.  Make take an additional 0.5 mg po if needed. (Patient not taking: Reported on 07/28/2019) 3 tablet 0  . amLODipine (NORVASC) 10 MG tablet Take 1 tablet (10 mg total) by mouth daily. 30 tablet 5  . aspirin EC 81 MG tablet Take 81 mg by mouth every morning.     .  cholecalciferol (VITAMIN D) 1000 UNITS tablet Take 1,000 Units by mouth every morning.     . Insulin Glargine (LANTUS SOLOSTAR) 100 UNIT/ML Solostar Pen Inject 32 Units into the skin daily.    [name] Kitchen lisinopril-hydrochlorothiazide (ZESTORETIC) 20-12.5 MG tablet TAKE 2 TABLETS BY MOUTH DAILY WITH BREAKFAST. 60 tablet 0  . metFORMIN (GLUCOPHAGE) 1000 MG tablet Take 1,000 mg by mouth 2 (two) times daily with a meal.    . Multiple Vitamin (MULTIVITAMIN WITH MINERALS) TABS tablet Take 1 tablet daily by mouth.    [name] Kitchen omeprazole (PRILOSEC) 20 MG capsule Take 1 capsule (20 mg total) by mouth daily. 30 capsule 0  . oxyCODONE (OXY IR/ROXICODONE) 5 MG immediate release tablet Take 1  tablet (5 mg total) by mouth every 6 (six) hours as needed for severe pain. 15 tablet 0  . PAZEO 0.7 % SOLN Place 1 drop into both eyes every morning.   3  . simvastatin (ZOCOR) 40 MG tablet Take 20 mg by mouth every evening.      Current Facility-Administered Medications  Medication Dose Route Frequency Provider Last Rate Last Admin  . oxyCODONE (Oxy IR/ROXICODONE) immediate release tablet 5 mg  5 mg Oral Q6H PRN Jacqualine Mau, NP        REVIEW OF SYSTEMS:   A 10+ POINT REVIEW OF SYSTEMS WAS OBTAINED including neurology, dermatology, psychiatry, cardiac, respiratory, lymph, extremities, GI, GU, Musculoskeletal, constitutional, breasts, reproductive, HEENT.  All pertinent positives are noted in the HPI.  All others are negative.   PHYSICAL EXAMINATION: ECOG FS:1 - Symptomatic but completely ambulatory  Vitals:   07/24/20 1139  BP: (!) 153/84  Pulse: 72  Resp: 18  Temp: (!) 97.4 F (36.3 C)  SpO2: 100%   Wt Readings from Last 3 Encounters:  07/24/20 234 lb (106.1 kg)  05/08/20 231 lb 14.4 oz (105.2 kg)  03/01/20 246 lb (111.6 kg)   Body mass index is 31.74 kg/m.    GENERAL:alert, in no acute distress and comfortable SKIN: no acute rashes, no significant lesions EYES: conjunctiva are pink and non-injected,  sclera anicteric OROPHARYNX: MMM, no exudates, no oropharyngeal erythema or ulceration NECK: supple, no JVD LYMPH:  no palpable lymphadenopathy in the cervical, axillary or inguinal regions LUNGS: clear to auscultation b/l with normal respiratory effort HEART: regular rate & rhythm ABDOMEN:  normoactive bowel sounds , non tender, not distended. No palpable hepatosplenomegaly.  Extremity: no pedal edema PSYCH: alert & oriented x 3 with fluent speech NEURO: no focal motor/sensory deficits  LABORATORY DATA:  I have reviewed the data as listed  . CBC Latest Ref Rng & Units 07/24/2020 06/18/2020 05/08/2020  WBC 4.0 - 10.5 K/uL 4.3 5.3 5.3  Hemoglobin 13.0 - 17.0 g/dL 10.9(L) 10.6(L) 10.4(L)  Hematocrit 39 - 52 % 35.5(L) 35.7(L) 34.3(L)  Platelets 150 - 400 K/uL 285 346 299   . CBC    Component Value Date/Time   WBC 4.3 07/24/2020 1056   WBC 5.3 06/18/2020 0900   RBC 4.93 07/24/2020 1056   HGB 10.9 (L) 07/24/2020 1056   HGB 11.5 (L) 03/22/2017 1402   HCT 35.5 (L) 07/24/2020 1056   HCT 37.0 (L) 03/22/2017 1402   PLT 285 07/24/2020 1056   PLT 262 03/22/2017 1402   MCV 72.0 (L) 07/24/2020 1056   MCV 72.3 (L) 03/22/2017 1402   MCH 22.1 (L) 07/24/2020 1056   MCHC 30.7 07/24/2020 1056   RDW 15.6 (H) 07/24/2020 1056   RDW 15.6 (H) 03/22/2017 1402   LYMPHSABS 0.7 07/24/2020 1056   LYMPHSABS 1.8 03/22/2017 1402   MONOABS 0.4 07/24/2020 1056   MONOABS 0.4 03/22/2017 1402   EOSABS 0.2 07/24/2020 1056   EOSABS 0.1 03/22/2017 1402   BASOSABS 0.0 07/24/2020 1056   BASOSABS 0.0 03/22/2017 1402    . CMP Latest Ref Rng & Units 07/24/2020 06/18/2020 06/18/2020  Glucose 70 - 99 mg/dL 159(H) - -  BUN 8 - 23 mg/dL 40(H) - -  Creatinine 0.61 - 1.24 mg/dL 2.13(H) - -  Sodium 135 - 145 mmol/L 138 - -  Potassium 3.5 - 5.1 mmol/L 4.8 - -  Chloride 98 - 111 mmol/L 106 - -  CO2 22 - 32 mmol/L 22 - -  Calcium 8.9 -  10.3 mg/dL 10.2 - -  Total Protein 6.5 - 8.1 g/dL 8.2(H) - 8.2(H)  Total  Bilirubin 0.3 - 1.2 mg/dL 0.4 0.6 0.7  Alkaline Phos 38 - 126 U/L 75 - 56  AST 15 - 41 U/L 31 - 26  ALT 0 - 44 U/L 19 - 17    06/21/2019 MR ABDOMEN WWO CONTRAST (Accession [id number])     IFE 1  Comment   Comments: Immunofixation shows IgG monoclonal protein with kappa light chain  specificity.           RADIOGRAPHIC STUDIES:  MRI abd w and wo contrast: 12/17/2016: IMPRESSION: 1. Postprocedural changes of thermal ablation again noted in the right lobe of the liver, without definitive evidence to suggest local recurrence of disease. No new hepatic lesions are noted. 2. Additional incidental findings, as above.   Electronically Signed   By: Vinnie Langton M.D.   On: 12/17/2016 09:42    MRI abd w and wo contrast 01/26/2020 IMPRESSION: 1. Interval enlargement of multiple hepatic masses as detailed above, consistent with progression of multifocal hepatocellular carcinoma. There is evidence of prior ablation in hepatic segments VII and VIII. 2. No evidence of metastatic disease in the abdomen.   Electronically Signed   By: Eddie Candle M.D.   On: 01/27/2020 15:51   ASSESSMENT & PLAN:   74 y.o. male with  #1 Monoclonal gammopathy of undetermined significance.  M protein 0.2 mg/dL immunofixation showing IgG monoclonal protein with kappa light chain specificity. SPEP 02/2016 - 0.3g/dl  No overt anemia.  No overt hypercalcemia. Stable CKD creatinine 1.6  #2 bone lucencies in the right radial mid midshaft and left humeral head. PET/CT did not show any hypermetabolic bone lesions. MRI left shoulder showed no evidence of metastatic disease or multiple myeloma. The x-ray findings appear to be areas of mild demineralization. Patient was seen by orthopedics and no additional recommendations were given.  Plan -Patient's anemia is stable. No significant change in renal function. No new bone pains. No hypercalcemia . No overall no overt evidence of progression of  multiple myeloma. -Myeloma labs from today show stable M protein @ 0.2g/dl with no evidence of progression- consistent with MGUS.  #3 Hepatocellular carcinoma treated with TACE and percutaneous thermal ablation by interventional radiology.   Last MRI on 08/06/2015 showed slight decrease in the size of the ablation defect involving the right lobe of the liver area and no findings to suggest residual or recurrent hepatocellular carcinoma. Mild changes of liver cirrhosis.  01/16/2016 MRI Abdomen showed resolution of previously ablated lesion but a new 1.3 cm lesion was noted which was subsequently ablated under CT guidance by interventional radiology on 01/31/2016. Elevated transaminases on 02/01/2016 were likely related to his ablation. These have since resolved.   06/24/2016 MRI Abdomen showed no evidence of recurrent hepatocellular carcinoma . 12/17/2016 MRI Abdomen shows no evidence of recurrent hepatocellular carcinoma .  10/26/17 MRI Abdomen revealed Motion degraded images. Status post thermal ablation of the segment 8 lesion, without enhancement to suggest residual viable tumor. Prior ablation of a segment 7 lesion, grossly unchanged. No convincing central enhancement  03/14/2019  MRI abdomen w and wo contrast revealed "1. New areas of restricted diffusion, surrounding nodular arterial phase enhancement and delayed washout surrounding the ablation zone defects anteriorly in segments 8 and 7 consistent with local recurrence of hepatocellular carcinoma. 2. The peripheral ablation zone defect laterally in segment 8 is unchanged. 3. No extrahepatic tumor identified."  10/02/2019 MRI Abdomen ([id number]) revealed "1.  Progressive multifocal hepatocellular carcinoma, especially anteriorly around the ablated lesion in segments 4B and 8. There are additional new lesions in segments 2 and 3. No evidence of extrahepatic metastatic disease."  01/26/2020 MRI Abdomen ([id number]) revealed "1. Interval enlargement  of multiple hepatic masses as detailed above, consistent with progression of multifocal hepatocellular carcinoma. There is evidence of prior ablation in hepatic segments VII and VIII. 2. No evidence of metastatic disease in the abdomen."  #4 Hepatitis C genotype 1A status post treatment with Viekira and Ribavarin for 24 weeks.   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   PLAN:  -Discussed pt labwork today, 07/24/20; mild, stable anemia, liver enzymes are stable, Creatinine is elevated. -Discussed 07/24/2020 Total Protein Ur is "Negative", TSH is in progress -Discussed 07/24/2020 AFP tumor marker is in progress, 06/18/2020 AFP tumor marker at 46.8 -Advised pt that we would like to start Lenvatinib to control the spread and progression of disease on a systemic level.  -Advised pt that we would monitor blood pressure, electrolytes, liver function, and abnormal/excessive bleeding with Lenvatinib. -Recommended that the pt continue to hydrate well - drink at least 48-64 oz of water each day.  -Recommend pt record his blood pressures at home. Take in the morning, before food or activity. -Recommend pt f/u with PCP for left shoulder pain - Orthopedic referral may be considered.  -Recommend pt f/u with PCP for Amlodipine management -Recommend pt continue f/u with Dr. Anselm Pancoast -Refill Amoldipine -Will see back in 3 weeks with labs   FOLLOW UP: RTC with Dr. Irene Limbo in 3 weeks with labs    The total time spent in the appt was 30 minutes and more than 50% was on counseling and direct patient cares.  All of the patient's questions were answered with apparent satisfaction. The patient knows to call the clinic with any problems, questions or concerns.   [name] MD [place] AAHIVMS [facility] [place] Hematology/Oncology Physician [facility]  (Office):       [phone] (Work cell):  [phone] (Fax):            [phone]   I, Yevette Edwards, am acting as a scribe for Dr. [name].   .I have reviewed the above documentation for accuracy and completeness, and I agree with the above. Brunetta Genera MD

## 2020-07-24 ENCOUNTER — Inpatient Hospital Stay: Payer: Medicare HMO

## 2020-07-24 ENCOUNTER — Other Ambulatory Visit: Payer: Self-pay

## 2020-07-24 ENCOUNTER — Inpatient Hospital Stay: Payer: Medicare HMO | Attending: Hematology | Admitting: Hematology

## 2020-07-24 VITALS — BP 153/84 | HR 72 | Temp 97.4°F | Resp 18 | Ht 72.0 in | Wt 234.0 lb

## 2020-07-24 DIAGNOSIS — B1920 Unspecified viral hepatitis C without hepatic coma: Secondary | ICD-10-CM | POA: Insufficient documentation

## 2020-07-24 DIAGNOSIS — Z794 Long term (current) use of insulin: Secondary | ICD-10-CM | POA: Insufficient documentation

## 2020-07-24 DIAGNOSIS — Z87891 Personal history of nicotine dependence: Secondary | ICD-10-CM | POA: Insufficient documentation

## 2020-07-24 DIAGNOSIS — Z79899 Other long term (current) drug therapy: Secondary | ICD-10-CM | POA: Insufficient documentation

## 2020-07-24 DIAGNOSIS — Z8505 Personal history of malignant neoplasm of liver: Secondary | ICD-10-CM | POA: Diagnosis not present

## 2020-07-24 DIAGNOSIS — Z7982 Long term (current) use of aspirin: Secondary | ICD-10-CM | POA: Diagnosis not present

## 2020-07-24 DIAGNOSIS — D472 Monoclonal gammopathy: Secondary | ICD-10-CM | POA: Insufficient documentation

## 2020-07-24 DIAGNOSIS — C220 Liver cell carcinoma: Secondary | ICD-10-CM

## 2020-07-24 DIAGNOSIS — I10 Essential (primary) hypertension: Secondary | ICD-10-CM | POA: Insufficient documentation

## 2020-07-24 DIAGNOSIS — E119 Type 2 diabetes mellitus without complications: Secondary | ICD-10-CM | POA: Diagnosis not present

## 2020-07-24 DIAGNOSIS — D509 Iron deficiency anemia, unspecified: Secondary | ICD-10-CM | POA: Diagnosis not present

## 2020-07-24 LAB — CBC WITH DIFFERENTIAL (CANCER CENTER ONLY)
Abs Immature Granulocytes: 0.03 10*3/uL (ref 0.00–0.07)
Basophils Absolute: 0 10*3/uL (ref 0.0–0.1)
Basophils Relative: 1 %
Eosinophils Absolute: 0.2 10*3/uL (ref 0.0–0.5)
Eosinophils Relative: 4 %
HCT: 35.5 % — ABNORMAL LOW (ref 39.0–52.0)
Hemoglobin: 10.9 g/dL — ABNORMAL LOW (ref 13.0–17.0)
Immature Granulocytes: 1 %
Lymphocytes Relative: 17 %
Lymphs Abs: 0.7 10*3/uL (ref 0.7–4.0)
MCH: 22.1 pg — ABNORMAL LOW (ref 26.0–34.0)
MCHC: 30.7 g/dL (ref 30.0–36.0)
MCV: 72 fL — ABNORMAL LOW (ref 80.0–100.0)
Monocytes Absolute: 0.4 10*3/uL (ref 0.1–1.0)
Monocytes Relative: 10 %
Neutro Abs: 2.9 10*3/uL (ref 1.7–7.7)
Neutrophils Relative %: 67 %
Platelet Count: 285 10*3/uL (ref 150–400)
RBC: 4.93 MIL/uL (ref 4.22–5.81)
RDW: 15.6 % — ABNORMAL HIGH (ref 11.5–15.5)
WBC Count: 4.3 10*3/uL (ref 4.0–10.5)
nRBC: 0 % (ref 0.0–0.2)

## 2020-07-24 LAB — TOTAL PROTEIN, URINE DIPSTICK: Protein, ur: NEGATIVE mg/dL

## 2020-07-24 LAB — CMP (CANCER CENTER ONLY)
ALT: 19 U/L (ref 0–44)
AST: 31 U/L (ref 15–41)
Albumin: 3.8 g/dL (ref 3.5–5.0)
Alkaline Phosphatase: 75 U/L (ref 38–126)
Anion gap: 10 (ref 5–15)
BUN: 40 mg/dL — ABNORMAL HIGH (ref 8–23)
CO2: 22 mmol/L (ref 22–32)
Calcium: 10.2 mg/dL (ref 8.9–10.3)
Chloride: 106 mmol/L (ref 98–111)
Creatinine: 2.13 mg/dL — ABNORMAL HIGH (ref 0.61–1.24)
GFR, Estimated: 32 mL/min — ABNORMAL LOW (ref >60)
Glucose, Bld: 159 mg/dL — ABNORMAL HIGH (ref 70–99)
Potassium: 4.8 mmol/L (ref 3.5–5.1)
Sodium: 138 mmol/L (ref 135–145)
Total Bilirubin: 0.4 mg/dL (ref 0.3–1.2)
Total Protein: 8.2 g/dL — ABNORMAL HIGH (ref 6.5–8.1)

## 2020-07-24 LAB — TSH: TSH: 0.574 u[IU]/mL (ref 0.320–4.118)

## 2020-07-24 MED ORDER — AMLODIPINE BESYLATE 10 MG PO TABS
10.0000 mg | ORAL_TABLET | Freq: Every day | ORAL | 5 refills | Status: DC
Start: 2020-07-24 — End: 2020-11-20

## 2020-07-25 LAB — AFP TUMOR MARKER: AFP, Serum, Tumor Marker: 36.1 ng/mL — ABNORMAL HIGH (ref 0.0–8.3)

## 2020-07-31 ENCOUNTER — Telehealth: Payer: Self-pay

## 2020-07-31 ENCOUNTER — Other Ambulatory Visit: Payer: Self-pay | Admitting: Diagnostic Radiology

## 2020-07-31 ENCOUNTER — Telehealth: Payer: Self-pay | Admitting: Pharmacist

## 2020-07-31 DIAGNOSIS — C220 Liver cell carcinoma: Secondary | ICD-10-CM

## 2020-07-31 MED ORDER — LENVATINIB (8 MG DAILY DOSE) 2 X 4 MG PO CPPK: 8.0000 mg | ORAL_CAPSULE | Freq: Every day | ORAL | 1 refills | Status: DC

## 2020-07-31 MED ORDER — LENVATINIB (8 MG DAILY DOSE) 2 X 4 MG PO CPPK
8.0000 mg | ORAL_CAPSULE | Freq: Every day | ORAL | 1 refills | Status: DC
Start: 2020-07-31 — End: 2020-09-19

## 2020-07-31 NOTE — Telephone Encounter (Signed)
Oral Oncology Patient Advocate Encounter  Received notification from [facility] that prior authorization for Michel Santee is required.  PA submitted on CoverMyMeds Key [id number] Status is pending  Oral Oncology Clinic will continue to follow.   [place] Patient [place] Phone [phone] Fax [phone] 07/31/2020 9:27 AM

## 2020-07-31 NOTE — Telephone Encounter (Signed)
Oral Oncology Patient Advocate Encounter   Was successful in securing patient a $7500 grant from [employer] to provide copayment coverage for [place].  This will keep the out of pocket expense at $0.     I have spoken with patient.    The billing information is as follows and has been shared with [place].   Member ID: [id number] Group ID: [id number] RxBin: [id number] PCN: [id number] Dates of Eligibility: 07/31/20 through 07/31/21  Fund name:  Hepatocellular Carcinoma.  [place] Patient [place] Phone [phone] Fax [phone] 07/31/2020 12:05 PM

## 2020-07-31 NOTE — Telephone Encounter (Signed)
Oral Oncology Pharmacist Encounter  Received new prescription for Lenvima (lenvatinib) for the treatment of hepatocellular carcinoma, planned duration until disease progression or unacceptable drug toxicity.  Prescription dose and frequency assessed for appropriateness. Appropriate for therapy initiation.   CBC w/ Diff, CMP, TSH and total protein from 07/24/20 assessed, noted Scr of 2.13 mg/dL (CrCl ~32 mL/min), Hgb 10.9 g/dL - stable for patient. TSH 0.574 and urine protein negative.  Current medication list in Epic reviewed, no relevant/significant DDIs with Lenvima identified.  Evaluated chart and no patient barriers to medication adherence noted.   Prescription has been e-scribed to the [facility] for benefits analysis and approval.  Oral Oncology Clinic will continue to follow for insurance authorization, copayment issues, initial counseling and start date.  Leron Croak, PharmD, BCPS Hematology/Oncology Clinical Pharmacist [place] [facility] [phone] 07/31/2020 9:25 AM

## 2020-07-31 NOTE — Telephone Encounter (Signed)
Oral Oncology Patient Advocate Encounter  Prior Authorization for Aaron [name] has been approved.    PA# [id number] Effective dates: 07/31/20 through 08/23/21  Patients co-pay is $2456  Oral Oncology Clinic will continue to follow.   [place] Patient [place] Phone [phone] Fax [phone] 07/31/2020 11:12 AM

## 2020-08-01 NOTE — Telephone Encounter (Signed)
Oral Chemotherapy Pharmacist Encounter   Attempted to reach patient to provide update and offer for initial counseling on oral medication: Lenvima (lenvatinib).   No answer. Voicemail not set up.   Leron Croak, PharmD, BCPS Hematology/Oncology Clinical Pharmacist [place] [facility] [phone] 08/01/2020 1:47 PM

## 2020-08-02 ENCOUNTER — Other Ambulatory Visit: Payer: Self-pay | Admitting: Hematology

## 2020-08-02 NOTE — Telephone Encounter (Signed)
Oral Chemotherapy Pharmacist Encounter  I spoke with patient for overview of: Lenvima (lenvatinib) for the treatment of hepatocellular carcinoma, planned duration until disease progression or unacceptable toxicity.   Counseled patient on administration, dosing, side effects, monitoring, drug-food interactions, safe handling, storage, and disposal.  Patient will take Lenvima 4mg  capsules, 2 capsules (8mg ) by mouth once daily, with or without food, at approximately the same time each day.  Lenvima start date: 08/07/20  Adverse effects include but are not limited to: hypertension, hand-foot syndrome, diarrhea, joint pain, fatigue, headache, decreased calcium, proteinuria, increased risk of blood clots, and cardiac conduction issues.    Patient will obtain anti diarrheal and alert the office of 4 or more loose stools above baseline.  Patient instructed to notify office of any upcoming invasive procedures.  Michel Santee will be held for 6 days prior to scheduled surgery, restart based on healing and clinical judgement.   Reviewed with patient importance of keeping a medication schedule and plan for any missed doses. No barriers to medication adherence identified.  Medication reconciliation performed and medication/allergy list updated.  Insurance authorization for Michel Santee has been obtained. Test claim at the pharmacy revealed copayment $2456 for 1st fill of Lenvima. Copay unaffordable for patient. Fatima Sanger obtained for patient to bring out of pocket copay to $0. This will ship from the [facility] on 08/05/20 to deliver to patient's home on 08/06/20.  Patient informed the pharmacy will reach out 5-7 days prior to needing next fill of Lenvima to coordinate continued medication acquisition to prevent break in therapy.  All questions answered.  Mr. Kinnan voiced understanding and appreciation.   Medication education handout placed in mail for patient. Patient knows to call the  office with questions or concerns. Oral Chemotherapy Clinic phone number provided to patient.   Leron Croak, PharmD, BCPS Hematology/Oncology Clinical Pharmacist [place] [facility] [phone] 08/02/2020 11:30 AM

## 2020-08-02 NOTE — Telephone Encounter (Signed)
Please review for refill.  

## 2020-08-05 MED FILL — LENVIMA 8 MG DAILY DOSE: 2 X 4 | 30 days supply | Qty: 60 | Fill #0

## 2020-08-15 ENCOUNTER — Inpatient Hospital Stay: Payer: Medicare HMO

## 2020-08-15 ENCOUNTER — Inpatient Hospital Stay (HOSPITAL_BASED_OUTPATIENT_CLINIC_OR_DEPARTMENT_OTHER): Payer: Medicare HMO | Admitting: Hematology

## 2020-08-15 ENCOUNTER — Other Ambulatory Visit: Payer: Self-pay

## 2020-08-15 VITALS — BP 153/71 | HR 76 | Temp 97.5°F | Resp 18 | Ht 72.0 in | Wt 238.3 lb

## 2020-08-15 DIAGNOSIS — C220 Liver cell carcinoma: Secondary | ICD-10-CM

## 2020-08-15 DIAGNOSIS — Z794 Long term (current) use of insulin: Secondary | ICD-10-CM | POA: Diagnosis not present

## 2020-08-15 DIAGNOSIS — D472 Monoclonal gammopathy: Secondary | ICD-10-CM | POA: Diagnosis not present

## 2020-08-15 DIAGNOSIS — Z8505 Personal history of malignant neoplasm of liver: Secondary | ICD-10-CM | POA: Diagnosis not present

## 2020-08-15 DIAGNOSIS — Z87891 Personal history of nicotine dependence: Secondary | ICD-10-CM | POA: Diagnosis not present

## 2020-08-15 DIAGNOSIS — E119 Type 2 diabetes mellitus without complications: Secondary | ICD-10-CM | POA: Diagnosis not present

## 2020-08-15 DIAGNOSIS — B1920 Unspecified viral hepatitis C without hepatic coma: Secondary | ICD-10-CM | POA: Diagnosis not present

## 2020-08-15 DIAGNOSIS — D509 Iron deficiency anemia, unspecified: Secondary | ICD-10-CM | POA: Diagnosis not present

## 2020-08-15 DIAGNOSIS — Z7982 Long term (current) use of aspirin: Secondary | ICD-10-CM | POA: Diagnosis not present

## 2020-08-15 DIAGNOSIS — I10 Essential (primary) hypertension: Secondary | ICD-10-CM | POA: Diagnosis not present

## 2020-08-15 LAB — COMPREHENSIVE METABOLIC PANEL
ALT: 19 U/L (ref 0–44)
AST: 26 U/L (ref 15–41)
Albumin: 3.8 g/dL (ref 3.5–5.0)
Alkaline Phosphatase: 66 U/L (ref 38–126)
Anion gap: 10 (ref 5–15)
BUN: 30 mg/dL — ABNORMAL HIGH (ref 8–23)
CO2: 24 mmol/L (ref 22–32)
Calcium: 9.7 mg/dL (ref 8.9–10.3)
Chloride: 104 mmol/L (ref 98–111)
Creatinine, Ser: 1.87 mg/dL — ABNORMAL HIGH (ref 0.61–1.24)
GFR, Estimated: 37 mL/min — ABNORMAL LOW (ref >60)
Glucose, Bld: 117 mg/dL — ABNORMAL HIGH (ref 70–99)
Potassium: 4.8 mmol/L (ref 3.5–5.1)
Sodium: 138 mmol/L (ref 135–145)
Total Bilirubin: 0.5 mg/dL (ref 0.3–1.2)
Total Protein: 8.2 g/dL — ABNORMAL HIGH (ref 6.5–8.1)

## 2020-08-15 LAB — CBC WITH DIFFERENTIAL/PLATELET
Abs Immature Granulocytes: 0.01 10*3/uL (ref 0.00–0.07)
Basophils Absolute: 0 10*3/uL (ref 0.0–0.1)
Basophils Relative: 1 %
Eosinophils Absolute: 0.1 10*3/uL (ref 0.0–0.5)
Eosinophils Relative: 2 %
HCT: 37.1 % — ABNORMAL LOW (ref 39.0–52.0)
Hemoglobin: 11.2 g/dL — ABNORMAL LOW (ref 13.0–17.0)
Immature Granulocytes: 0 %
Lymphocytes Relative: 22 %
Lymphs Abs: 1.1 10*3/uL (ref 0.7–4.0)
MCH: 22 pg — ABNORMAL LOW (ref 26.0–34.0)
MCHC: 30.2 g/dL (ref 30.0–36.0)
MCV: 72.7 fL — ABNORMAL LOW (ref 80.0–100.0)
Monocytes Absolute: 0.5 10*3/uL (ref 0.1–1.0)
Monocytes Relative: 10 %
Neutro Abs: 3.2 10*3/uL (ref 1.7–7.7)
Neutrophils Relative %: 65 %
Platelets: 253 10*3/uL (ref 150–400)
RBC: 5.1 MIL/uL (ref 4.22–5.81)
RDW: 15.1 % (ref 11.5–15.5)
WBC: 4.9 10*3/uL (ref 4.0–10.5)
nRBC: 0 % (ref 0.0–0.2)

## 2020-08-15 LAB — TSH: TSH: 0.788 u[IU]/mL (ref 0.320–4.118)

## 2020-08-15 NOTE — Progress Notes (Signed)
Lab requesting orders

## 2020-08-15 NOTE — Progress Notes (Signed)
HEMATOLOGY/ONCOLOGY CLINIC NOTE  Date of Service: 08/15/2020  Patient Care Team: Jani Gravel, MD as PCP - General (Internal Medicine)  CHIEF COMPLAINTS/PURPOSE OF CONSULTATION:   [facility]- progression,toxicity check with avastin + atezolizumab  HISTORY OF PRESENTING ILLNESS:   Aaron [name] is a wonderful 74 y.o. male who has been referred to [place] by Dr Jani Gravel for evaluation and management of monoclonal gammopathy of undetermined significance.  Patient has history of hypertension, diabetes, dyslipidemia, hepatocellular carcinoma (rx with TACE and percutaneous thermal ablation), hepatitis C status post treatment, iron deficiency anemia in 2014 treated with oral iron.  Patient had an SPEP done by his primary care physician on 10/04/2015 that showed an M spike of 0.3 g/dL. It was presumably will be done due to his complaints of fatigue. He has had no significant anemia. No new bone pains. No fevers/chills/drenching night sweats.  No new anemia..  Outside labs show hemoglobin of 12.4 with microcytosis with an MCV of 70.5, normal WBC count of 5.1k.  No evidence of hypercalcemia or significant renal failure on his outside labs.   INTERVAL HISTORY: Aaron [name]  is here for his scheduled follow-up for MGUS and HCC. The patient's last visit with [place] was on 07/24/2020. The pt reports that he is doing well overall.  The pt reports that he began taking 8 mg Lenvatinib last Wednesday and is tolerating it well. He is denies any new symptoms.   Lab results today (08/15/20) of CBC w/diff and CMP is as follows: all values are WNL except for Hgb at 11.2, HCT at 37.1, MCV at 72.7, MCH at 22.0, Glucose at 117, BUN at 30, Creatinine at 1.87, Total Protein at 8.2, GFR Est at 37. 08/15/2020 TSH at 0.788  On review of systems, pt denies fatigue, diarrhea, mouth sores, rash, fevers, abdominal pain and any other symptoms.   MEDICAL HISTORY:  Past Medical History:  Diagnosis Date  . Arthritis   .  Diabetes mellitus without complication ([place])    type II   . Elevated liver enzymes   . Fatty liver   . Hepatitis C    genotype 1A status post treatment with Viekira and Ribavarin for 24 weeks  . Hepatocellular carcinoma ([place]) 01/30/2014   Path  . History of colon polyps   . Hyperlipidemia   . Hypertension   . Iron deficiency anemia    hx of   . MGUS (monoclonal gammopathy of unknown significance)    Obesity Hepatocellular carcinoma treated with TACE and percutaneous thermal ablation by interventional radiology. Last MRI on 08/06/2015 showed slight decrease in the size of the ablation defect involving the right lobe of the liver area and no findings to suggest residual or recurrent hepatocellular carcinoma. Mild changes of liver cirrhosis. MRI Abd 06/24/2016- no evidence of recurrent HCC   Hepatitis C genotype 1A status post treatment with Viekira and Ribavarin for 24 weeks.  Monoclonal gammopathy of undetermined significance.  SURGICAL HISTORY:  Status post microwave ablation[October 2015] of liver lesion and TACE [July 2015] for [place]. EGD and colonoscopy in 2016   SOCIAL HISTORY: Social History   Socioeconomic History  . Marital status: Married    Spouse name: Not on file  . Number of children: Not on file  . Years of education: Not on file  . Highest education level: Not on file  Occupational History  . Not on file  Tobacco Use  . Smoking status: Former Smoker    Quit date: 01/30/1994    Years  since quitting: 26.5  . Smokeless tobacco: Never Used  . Tobacco comment: stopped 25 yrs ago  Vaping Use  . Vaping Use: Never used  Substance and Sexual Activity  . Alcohol use: No    Comment: stopped 30 years ago   . Drug use: No  . Sexual activity: Not Currently  Other Topics Concern  . Not on file  Social History Narrative  . Not on file   Social Determinants of Health   Financial Resource Strain: Not on file  Food Insecurity: Not on file  Transportation Needs: Not on  file  Physical Activity: Not on file  Stress: Not on file  Social Connections: Not on file  Intimate Partner Violence: Not on file  Former smoker and smoked 1 pack per day for about 20 years starting at age 13 quit 27 years ago.  FAMILY HISTORY: No family history on file.  ALLERGIES:  has No Known Allergies.  MEDICATIONS:  Current Outpatient Medications  Medication Sig Dispense Refill  . ACCU-CHEK AVIVA PLUS test strip     . Accu-Chek Softclix Lancets lancets     . ALPRAZolam (XANAX) 0.5 MG tablet Take 1 tablet (0.5 mg total) by mouth as directed. Take 2 tablets (1.0 mg total) prior to MR scan.  Make take an additional 0.5 mg po if needed. (Patient not taking: Reported on 07/28/2019) 3 tablet 0  . amLODipine (NORVASC) 10 MG tablet Take 1 tablet (10 mg total) by mouth daily. 30 tablet 5  . aspirin EC 81 MG tablet Take 81 mg by mouth every morning.     . cholecalciferol (VITAMIN D) 1000 UNITS tablet Take 1,000 Units by mouth every morning.     . Insulin Glargine (LANTUS SOLOSTAR) 100 UNIT/ML Solostar Pen Inject 32 Units into the skin daily.    [name] Kitchen lenvatinib 8 mg daily dose (LENVIMA) 2 x 4 MG capsule Take 2 capsules (8 mg total) by mouth daily. 60 capsule 1  . lisinopril-hydrochlorothiazide (ZESTORETIC) 20-12.5 MG tablet TAKE 2 TABLETS BY MOUTH DAILY WITH BREAKFAST. 60 tablet 0  . metFORMIN (GLUCOPHAGE) 1000 MG tablet Take 1,000 mg by mouth 2 (two) times daily with a meal.    . Multiple Vitamin (MULTIVITAMIN WITH MINERALS) TABS tablet Take 1 tablet daily by mouth.    [name] Kitchen omeprazole (PRILOSEC) 20 MG capsule Take 1 capsule (20 mg total) by mouth daily. 30 capsule 0  . oxyCODONE (OXY IR/ROXICODONE) 5 MG immediate release tablet Take 1 tablet (5 mg total) by mouth every 6 (six) hours as needed for severe pain. 15 tablet 0  . PAZEO 0.7 % SOLN Place 1 drop into both eyes every morning.   3  . simvastatin (ZOCOR) 40 MG tablet Take 20 mg by mouth every evening.      Current Facility-Administered  Medications  Medication Dose Route Frequency Provider Last Rate Last Admin  . oxyCODONE (Oxy IR/ROXICODONE) immediate release tablet 5 mg  5 mg Oral Q6H PRN Jacqualine Mau, NP        REVIEW OF SYSTEMS:   A 10+ POINT REVIEW OF SYSTEMS WAS OBTAINED including neurology, dermatology, psychiatry, cardiac, respiratory, lymph, extremities, GI, GU, Musculoskeletal, constitutional, breasts, reproductive, HEENT.  All pertinent positives are noted in the HPI.  All others are negative.   PHYSICAL EXAMINATION: ECOG FS:1 - Symptomatic but completely ambulatory  Vitals:   08/15/20 1004  BP: (!) 153/71  Pulse: 76  Resp: 18  Temp: (!) 97.5 F (36.4 C)  SpO2: 100%   Wt Readings  from Last 3 Encounters:  08/15/20 238 lb 4.8 oz (108.1 kg)  07/24/20 234 lb (106.1 kg)  05/08/20 231 lb 14.4 oz (105.2 kg)   Body mass index is 32.32 kg/m.    GENERAL:alert, in no acute distress and comfortable SKIN: no acute rashes, no significant lesions EYES: conjunctiva are pink and non-injected, sclera anicteric OROPHARYNX: MMM, no exudates, no oropharyngeal erythema or ulceration NECK: supple, no JVD LYMPH:  no palpable lymphadenopathy in the cervical, axillary or inguinal regions LUNGS: clear to auscultation b/l with normal respiratory effort HEART: regular rate & rhythm ABDOMEN:  normoactive bowel sounds , non tender, not distended. No palpable hepatosplenomegaly.  Extremity: no pedal edema PSYCH: alert & oriented x 3 with fluent speech NEURO: no focal motor/sensory deficits  LABORATORY DATA:  I have reviewed the data as listed  . CBC Latest Ref Rng & Units 08/15/2020 07/24/2020 06/18/2020  WBC 4.0 - 10.5 K/uL 4.9 4.3 5.3  Hemoglobin 13.0 - 17.0 g/dL 11.2(L) 10.9(L) 10.6(L)  Hematocrit 39.0 - 52.0 % 37.1(L) 35.5(L) 35.7(L)  Platelets 150 - 400 K/uL 253 285 346   . CBC    Component Value Date/Time   WBC 4.9 08/15/2020 0950   RBC 5.10 08/15/2020 0950   HGB 11.2 (L) 08/15/2020 0950   HGB 10.9  (L) 07/24/2020 1056   HGB 11.5 (L) 03/22/2017 1402   HCT 37.1 (L) 08/15/2020 0950   HCT 37.0 (L) 03/22/2017 1402   PLT 253 08/15/2020 0950   PLT 285 07/24/2020 1056   PLT 262 03/22/2017 1402   MCV 72.7 (L) 08/15/2020 0950   MCV 72.3 (L) 03/22/2017 1402   MCH 22.0 (L) 08/15/2020 0950   MCHC 30.2 08/15/2020 0950   RDW 15.1 08/15/2020 0950   RDW 15.6 (H) 03/22/2017 1402   LYMPHSABS 1.1 08/15/2020 0950   LYMPHSABS 1.8 03/22/2017 1402   MONOABS 0.5 08/15/2020 0950   MONOABS 0.4 03/22/2017 1402   EOSABS 0.1 08/15/2020 0950   EOSABS 0.1 03/22/2017 1402   BASOSABS 0.0 08/15/2020 0950   BASOSABS 0.0 03/22/2017 1402    . CMP Latest Ref Rng & Units 08/15/2020 07/24/2020 06/18/2020  Glucose 70 - 99 mg/dL 117(H) 159(H) -  BUN 8 - 23 mg/dL 30(H) 40(H) -  Creatinine 0.61 - 1.24 mg/dL 1.87(H) 2.13(H) -  Sodium 135 - 145 mmol/L 138 138 -  Potassium 3.5 - 5.1 mmol/L 4.8 4.8 -  Chloride 98 - 111 mmol/L 104 106 -  CO2 22 - 32 mmol/L 24 22 -  Calcium 8.9 - 10.3 mg/dL 9.7 10.2 -  Total Protein 6.5 - 8.1 g/dL 8.2(H) 8.2(H) -  Total Bilirubin 0.3 - 1.2 mg/dL 0.5 0.4 0.6  Alkaline Phos 38 - 126 U/L 66 75 -  AST 15 - 41 U/L 26 31 -  ALT 0 - 44 U/L 19 19 -    06/21/2019 MR ABDOMEN WWO CONTRAST (Accession [id number])     IFE 1  Comment   Comments: Immunofixation shows IgG monoclonal protein with kappa light chain  specificity.           RADIOGRAPHIC STUDIES:  MRI abd w and wo contrast: 12/17/2016: IMPRESSION: 1. Postprocedural changes of thermal ablation again noted in the right lobe of the liver, without definitive evidence to suggest local recurrence of disease. No new hepatic lesions are noted. 2. Additional incidental findings, as above.   Electronically Signed   By: Vinnie Langton M.D.   On: 12/17/2016 09:42    MRI abd w and wo contrast 01/26/2020  IMPRESSION: 1. Interval enlargement of multiple hepatic masses as detailed above, consistent with progression of  multifocal hepatocellular carcinoma. There is evidence of prior ablation in hepatic segments VII and VIII. 2. No evidence of metastatic disease in the abdomen.   Electronically Signed   By: Eddie Candle M.D.   On: 01/27/2020 15:51   ASSESSMENT & PLAN:   74 y.o. male with  #1 Monoclonal gammopathy of undetermined significance.  M protein 0.2 mg/dL immunofixation showing IgG monoclonal protein with kappa light chain specificity. SPEP 02/2016 - 0.3g/dl  No overt anemia.  No overt hypercalcemia. Stable CKD creatinine 1.6  #2 bone lucencies in the right radial mid midshaft and left humeral head. PET/CT did not show any hypermetabolic bone lesions. MRI left shoulder showed no evidence of metastatic disease or multiple myeloma. The x-ray findings appear to be areas of mild demineralization. Patient was seen by orthopedics and no additional recommendations were given.  Plan -Patient's anemia is stable. No significant change in renal function. No new bone pains. No hypercalcemia . No overall no overt evidence of progression of multiple myeloma. -Myeloma labs from today show stable M protein @ 0.2g/dl with no evidence of progression- consistent with MGUS.  #3 Hepatocellular carcinoma treated with TACE and percutaneous thermal ablation by interventional radiology.   Last MRI on 08/06/2015 showed slight decrease in the size of the ablation defect involving the right lobe of the liver area and no findings to suggest residual or recurrent hepatocellular carcinoma. Mild changes of liver cirrhosis.  01/16/2016 MRI Abdomen showed resolution of previously ablated lesion but a new 1.3 cm lesion was noted which was subsequently ablated under CT guidance by interventional radiology on 01/31/2016. Elevated transaminases on 02/01/2016 were likely related to his ablation. These have since resolved.   06/24/2016 MRI Abdomen showed no evidence of recurrent hepatocellular carcinoma . 12/17/2016 MRI Abdomen shows  no evidence of recurrent hepatocellular carcinoma .  10/26/17 MRI Abdomen revealed Motion degraded images. Status post thermal ablation of the segment 8 lesion, without enhancement to suggest residual viable tumor. Prior ablation of a segment 7 lesion, grossly unchanged. No convincing central enhancement  03/14/2019  MRI abdomen w and wo contrast revealed "1. New areas of restricted diffusion, surrounding nodular arterial phase enhancement and delayed washout surrounding the ablation zone defects anteriorly in segments 8 and 7 consistent with local recurrence of hepatocellular carcinoma. 2. The peripheral ablation zone defect laterally in segment 8 is unchanged. 3. No extrahepatic tumor identified."  10/02/2019 MRI Abdomen ([id number]) revealed "1. Progressive multifocal hepatocellular carcinoma, especially anteriorly around the ablated lesion in segments 4B and 8. There are additional new lesions in segments 2 and 3. No evidence of extrahepatic metastatic disease."  01/26/2020 MRI Abdomen ([id number]) revealed "1. Interval enlargement of multiple hepatic masses as detailed above, consistent with progression of multifocal hepatocellular carcinoma. There is evidence of prior ablation in hepatic segments VII and VIII. 2. No evidence of metastatic disease in the abdomen."  #4 Hepatitis C genotype 1A status post treatment with Viekira and Ribavarin for 24 weeks.   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   PLAN:  -Discussed pt labwork today, 08/15/20; all values are WNL except for Hgb at 11.2, HCT at 37.1, MCV at 72.7, MCH at 22.0, Glucose at 117, BUN at 30, Creatinine at 1.87, Total Protein at 8.2, GFR Est at 37. -Discussed 08/15/2020 TSH is WNL. -Discussed 07/24/2020 AFP tumor marker at 46.8 -The pt has no  prohibitive toxicities from continuing 8 mg Lenvatinib daily at this time.  -Advised pt that if blood counts and symptoms are  steady with 8 mg Lenvatinib for 1 month will increase to 12 mg.  -Advised pt that Lenvatinib can increase blood pressures. Recommend pt check blood pressures every other day & keep a journal.  -Recommend pt f/u with PCP to confirm that he is up to date with his Pneumonia vaccines.  -Recommend pt limit dietary sodium as much as possible.  -Will give annual flu vaccine in clinic today.  -Recommend pt continue f/u with Dr. Anselm Pancoast  -Will see back in 4 weeks with labs    FOLLOW UP: RTC with Dr. Irene Limbo in 4 weeks with labs    The total time spent in the appt was 20 minutes and more than 50% was on counseling and direct patient cares.  All of the patient's questions were answered with apparent satisfaction. The patient knows to call the clinic with any problems, questions or concerns.   [name] MD [place] AAHIVMS [facility] Hematology/Oncology Physician [facility]  (Office):       [phone] (Work cell):  [phone] (Fax):           [phone]  I, Yevette Edwards, am acting as a scribe for Dr. [name].   .I have reviewed the above documentation for accuracy and completeness, and I agree with the above. Brunetta Genera MD

## 2020-08-21 ENCOUNTER — Telehealth: Payer: Self-pay | Admitting: Hematology

## 2020-08-21 NOTE — Telephone Encounter (Signed)
Scheduled per 12/23 los, patient has been called and notified. 

## 2020-08-28 ENCOUNTER — Telehealth: Payer: Self-pay

## 2020-08-28 NOTE — Telephone Encounter (Signed)
Oral Oncology Patient Advocate Encounter   Was successful in securing patient a $10000 grant from [employer] (PAF) to provide copayment coverage for Lenvima.  This will keep the out of pocket expense at $0.     I have spoken with the patient.    The billing information is as follows and has been shared with [place]: [id number] PCN:  [id number] Member ID: [id number]  Group ID: [id number]  Dates of Eligibility: 08/28/20 through 08/28/21  [place] Patient [place] Phone [phone] Fax [phone] 08/28/2020 11:40 AM

## 2020-09-03 MED FILL — LENVIMA 8 MG DAILY DOSE: 2 X 4 | 30 days supply | Qty: 60 | Fill #1

## 2020-09-09 ENCOUNTER — Inpatient Hospital Stay: Payer: Medicare PPO | Admitting: Hematology

## 2020-09-09 ENCOUNTER — Inpatient Hospital Stay: Payer: Medicare PPO | Attending: Hematology

## 2020-09-09 DIAGNOSIS — D509 Iron deficiency anemia, unspecified: Secondary | ICD-10-CM | POA: Insufficient documentation

## 2020-09-09 DIAGNOSIS — D472 Monoclonal gammopathy: Secondary | ICD-10-CM | POA: Insufficient documentation

## 2020-09-09 DIAGNOSIS — Z8505 Personal history of malignant neoplasm of liver: Secondary | ICD-10-CM | POA: Insufficient documentation

## 2020-09-09 NOTE — Progress Notes (Incomplete)
HEMATOLOGY/ONCOLOGY CLINIC NOTE  Date of Service: 09/09/2020  Patient Care Team: Jani Gravel, MD as PCP - General (Internal Medicine)  CHIEF COMPLAINTS/PURPOSE OF CONSULTATION:   [facility]- progression,toxicity check with avastin + atezolizumab  HISTORY OF PRESENTING ILLNESS:   Aaron [name] is a wonderful 75 y.o. male who has been referred to [place] by Dr Jani Gravel for evaluation and management of monoclonal gammopathy of undetermined significance.  Patient has history of hypertension, diabetes, dyslipidemia, hepatocellular carcinoma (rx with TACE and percutaneous thermal ablation), hepatitis C status post treatment, iron deficiency anemia in 2014 treated with oral iron.  Patient had an SPEP done by his primary care physician on 10/04/2015 that showed an M spike of 0.3 g/dL. It was presumably will be done due to his complaints of fatigue. He has had no significant anemia. No new bone pains. No fevers/chills/drenching night sweats.  No new anemia..  Outside labs show hemoglobin of 12.4 with microcytosis with an MCV of 70.5, normal WBC count of 5.1k.  No evidence of hypercalcemia or significant renal failure on his outside labs.   INTERVAL HISTORY: Aaron [name]  is here for his scheduled follow-up for MGUS and HCC. The patient's last visit with [place] was on 08/15/2020.   MEDICAL HISTORY:  Past Medical History:  Diagnosis Date  . Arthritis   . Diabetes mellitus without complication ([place])    type II   . Elevated liver enzymes   . Fatty liver   . Hepatitis C    genotype 1A status post treatment with Viekira and Ribavarin for 24 weeks  . Hepatocellular carcinoma ([place]) 01/30/2014   Path  . History of colon polyps   . Hyperlipidemia   . Hypertension   . Iron deficiency anemia    hx of   . MGUS (monoclonal gammopathy of unknown significance)    Obesity Hepatocellular carcinoma treated with TACE and percutaneous thermal ablation by interventional radiology. Last MRI on 08/06/2015 showed  slight decrease in the size of the ablation defect involving the right lobe of the liver area and no findings to suggest residual or recurrent hepatocellular carcinoma. Mild changes of liver cirrhosis. MRI Abd 06/24/2016- no evidence of recurrent HCC   Hepatitis C genotype 1A status post treatment with Viekira and Ribavarin for 24 weeks.  Monoclonal gammopathy of undetermined significance.  SURGICAL HISTORY:  Status post microwave ablation[October 2015] of liver lesion and TACE [July 2015] for [place]. EGD and colonoscopy in 2016   SOCIAL HISTORY: Social History   Socioeconomic History  . Marital status: Married    Spouse name: Not on file  . Number of children: Not on file  . Years of education: Not on file  . Highest education level: Not on file  Occupational History  . Not on file  Tobacco Use  . Smoking status: Former Smoker    Quit date: 01/30/1994    Years since quitting: 26.6  . Smokeless tobacco: Never Used  . Tobacco comment: stopped 25 yrs ago  Vaping Use  . Vaping Use: Never used  Substance and Sexual Activity  . Alcohol use: No    Comment: stopped 30 years ago   . Drug use: No  . Sexual activity: Not Currently  Other Topics Concern  . Not on file  Social History Narrative  . Not on file   Social Determinants of Health   Financial Resource Strain: Not on file  Food Insecurity: Not on file  Transportation Needs: Not on file  Physical Activity: Not on  file  Stress: Not on file  Social Connections: Not on file  Intimate Partner Violence: Not on file  Former smoker and smoked 1 pack per day for about 20 years starting at age 45 quit 47 years ago.  FAMILY HISTORY: No family history on file.  ALLERGIES:  has No Known Allergies.  MEDICATIONS:  Current Outpatient Medications  Medication Sig Dispense Refill  . ACCU-CHEK AVIVA PLUS test strip     . Accu-Chek Softclix Lancets lancets     . ALPRAZolam (XANAX) 0.5 MG tablet Take 1 tablet (0.5 mg total) by mouth as  directed. Take 2 tablets (1.0 mg total) prior to MR scan.  Make take an additional 0.5 mg po if needed. (Patient not taking: Reported on 07/28/2019) 3 tablet 0  . amLODipine (NORVASC) 10 MG tablet Take 1 tablet (10 mg total) by mouth daily. 30 tablet 5  . aspirin EC 81 MG tablet Take 81 mg by mouth every morning.     . cholecalciferol (VITAMIN D) 1000 UNITS tablet Take 1,000 Units by mouth every morning.     . Insulin Glargine (LANTUS SOLOSTAR) 100 UNIT/ML Solostar Pen Inject 32 Units into the skin daily.    [name] Kitchen lenvatinib 8 mg daily dose (LENVIMA) 2 x 4 MG capsule Take 2 capsules (8 mg total) by mouth daily. 60 capsule 1  . lisinopril-hydrochlorothiazide (ZESTORETIC) 20-12.5 MG tablet TAKE 2 TABLETS BY MOUTH DAILY WITH BREAKFAST. 60 tablet 0  . metFORMIN (GLUCOPHAGE) 1000 MG tablet Take 1,000 mg by mouth 2 (two) times daily with a meal.    . Multiple Vitamin (MULTIVITAMIN WITH MINERALS) TABS tablet Take 1 tablet daily by mouth.    [name] Kitchen omeprazole (PRILOSEC) 20 MG capsule Take 1 capsule (20 mg total) by mouth daily. 30 capsule 0  . oxyCODONE (OXY IR/ROXICODONE) 5 MG immediate release tablet Take 1 tablet (5 mg total) by mouth every 6 (six) hours as needed for severe pain. 15 tablet 0  . PAZEO 0.7 % SOLN Place 1 drop into both eyes every morning.   3  . simvastatin (ZOCOR) 40 MG tablet Take 20 mg by mouth every evening.      Current Facility-Administered Medications  Medication Dose Route Frequency Provider Last Rate Last Admin  . oxyCODONE (Oxy IR/ROXICODONE) immediate release tablet 5 mg  5 mg Oral Q6H PRN Jacqualine Mau, NP        REVIEW OF SYSTEMS:   10 Point review of Systems was done is negative except as noted above.  PHYSICAL EXAMINATION: ECOG FS:1 - Symptomatic but completely ambulatory  There were no vitals filed for this visit. Wt Readings from Last 3 Encounters:  08/15/20 238 lb 4.8 oz (108.1 kg)  07/24/20 234 lb (106.1 kg)  05/08/20 231 lb 14.4 oz (105.2 kg)   There is no  height or weight on file to calculate BMI.    *** GENERAL:alert, in no acute distress and comfortable SKIN: no acute rashes, no significant lesions EYES: conjunctiva are pink and non-injected, sclera anicteric OROPHARYNX: MMM, no exudates, no oropharyngeal erythema or ulceration NECK: supple, no JVD LYMPH:  no palpable lymphadenopathy in the cervical, axillary or inguinal regions LUNGS: clear to auscultation b/l with normal respiratory effort HEART: regular rate & rhythm ABDOMEN:  normoactive bowel sounds , non tender, not distended. No palpable hepatosplenomegaly.  Extremity: no pedal edema PSYCH: alert & oriented x 3 with fluent speech NEURO: no focal motor/sensory deficits  LABORATORY DATA:  I have reviewed the data as listed  .  CBC Latest Ref Rng & Units 08/15/2020 07/24/2020 06/18/2020  WBC 4.0 - 10.5 K/uL 4.9 4.3 5.3  Hemoglobin 13.0 - 17.0 g/dL 11.2(L) 10.9(L) 10.6(L)  Hematocrit 39.0 - 52.0 % 37.1(L) 35.5(L) 35.7(L)  Platelets 150 - 400 K/uL 253 285 346   . CBC    Component Value Date/Time   WBC 4.9 08/15/2020 0950   RBC 5.10 08/15/2020 0950   HGB 11.2 (L) 08/15/2020 0950   HGB 10.9 (L) 07/24/2020 1056   HGB 11.5 (L) 03/22/2017 1402   HCT 37.1 (L) 08/15/2020 0950   HCT 37.0 (L) 03/22/2017 1402   PLT 253 08/15/2020 0950   PLT 285 07/24/2020 1056   PLT 262 03/22/2017 1402   MCV 72.7 (L) 08/15/2020 0950   MCV 72.3 (L) 03/22/2017 1402   MCH 22.0 (L) 08/15/2020 0950   MCHC 30.2 08/15/2020 0950   RDW 15.1 08/15/2020 0950   RDW 15.6 (H) 03/22/2017 1402   LYMPHSABS 1.1 08/15/2020 0950   LYMPHSABS 1.8 03/22/2017 1402   MONOABS 0.5 08/15/2020 0950   MONOABS 0.4 03/22/2017 1402   EOSABS 0.1 08/15/2020 0950   EOSABS 0.1 03/22/2017 1402   BASOSABS 0.0 08/15/2020 0950   BASOSABS 0.0 03/22/2017 1402    . CMP Latest Ref Rng & Units 08/15/2020 07/24/2020 06/18/2020  Glucose 70 - 99 mg/dL 117(H) 159(H) -  BUN 8 - 23 mg/dL 30(H) 40(H) -  Creatinine 0.61 - 1.24 mg/dL  1.87(H) 2.13(H) -  Sodium 135 - 145 mmol/L 138 138 -  Potassium 3.5 - 5.1 mmol/L 4.8 4.8 -  Chloride 98 - 111 mmol/L 104 106 -  CO2 22 - 32 mmol/L 24 22 -  Calcium 8.9 - 10.3 mg/dL 9.7 10.2 -  Total Protein 6.5 - 8.1 g/dL 8.2(H) 8.2(H) -  Total Bilirubin 0.3 - 1.2 mg/dL 0.5 0.4 0.6  Alkaline Phos 38 - 126 U/L 66 75 -  AST 15 - 41 U/L 26 31 -  ALT 0 - 44 U/L 19 19 -    06/21/2019 MR ABDOMEN WWO CONTRAST (Accession [id number])     IFE 1  Comment   Comments: Immunofixation shows IgG monoclonal protein with kappa light chain  specificity.           RADIOGRAPHIC STUDIES:  MRI abd w and wo contrast: 12/17/2016: IMPRESSION: 1. Postprocedural changes of thermal ablation again noted in the right lobe of the liver, without definitive evidence to suggest local recurrence of disease. No new hepatic lesions are noted. 2. Additional incidental findings, as above.   Electronically Signed   By: Vinnie Langton M.D.   On: 12/17/2016 09:42    MRI abd w and wo contrast 01/26/2020 IMPRESSION: 1. Interval enlargement of multiple hepatic masses as detailed above, consistent with progression of multifocal hepatocellular carcinoma. There is evidence of prior ablation in hepatic segments VII and VIII. 2. No evidence of metastatic disease in the abdomen.   Electronically Signed   By: Eddie Candle M.D.   On: 01/27/2020 15:51   ASSESSMENT & PLAN:   75 y.o. male with  #1 Monoclonal gammopathy of undetermined significance.  M protein 0.2 mg/dL immunofixation showing IgG monoclonal protein with kappa light chain specificity. SPEP 02/2016 - 0.3g/dl  No overt anemia.  No overt hypercalcemia. Stable CKD creatinine 1.6  #2 bone lucencies in the right radial mid midshaft and left humeral head. PET/CT did not show any hypermetabolic bone lesions. MRI left shoulder showed no evidence of metastatic disease or multiple myeloma. The x-ray findings appear to  be areas of mild  demineralization. Patient was seen by orthopedics and no additional recommendations were given.  Plan -Patient's anemia is stable. No significant change in renal function. No new bone pains. No hypercalcemia . No overall no overt evidence of progression of multiple myeloma. -Myeloma labs from today show stable M protein @ 0.2g/dl with no evidence of progression- consistent with MGUS.  #3 Hepatocellular carcinoma treated with TACE and percutaneous thermal ablation by interventional radiology.   Last MRI on 08/06/2015 showed slight decrease in the size of the ablation defect involving the right lobe of the liver area and no findings to suggest residual or recurrent hepatocellular carcinoma. Mild changes of liver cirrhosis.  01/16/2016 MRI Abdomen showed resolution of previously ablated lesion but a new 1.3 cm lesion was noted which was subsequently ablated under CT guidance by interventional radiology on 01/31/2016. Elevated transaminases on 02/01/2016 were likely related to his ablation. These have since resolved.   06/24/2016 MRI Abdomen showed no evidence of recurrent hepatocellular carcinoma . 12/17/2016 MRI Abdomen shows no evidence of recurrent hepatocellular carcinoma .  10/26/17 MRI Abdomen revealed Motion degraded images. Status post thermal ablation of the segment 8 lesion, without enhancement to suggest residual viable tumor. Prior ablation of a segment 7 lesion, grossly unchanged. No convincing central enhancement  03/14/2019  MRI abdomen w and wo contrast revealed "1. New areas of restricted diffusion, surrounding nodular arterial phase enhancement and delayed washout surrounding the ablation zone defects anteriorly in segments 8 and 7 consistent with local recurrence of hepatocellular carcinoma. 2. The peripheral ablation zone defect laterally in segment 8 is unchanged. 3. No extrahepatic tumor identified."  10/02/2019 MRI Abdomen ([id number]) revealed "1. Progressive multifocal  hepatocellular carcinoma, especially anteriorly around the ablated lesion in segments 4B and 8. There are additional new lesions in segments 2 and 3. No evidence of extrahepatic metastatic disease."  01/26/2020 MRI Abdomen ([id number]) revealed "1. Interval enlargement of multiple hepatic masses as detailed above, consistent with progression of multifocal hepatocellular carcinoma. There is evidence of prior ablation in hepatic segments VII and VIII. 2. No evidence of metastatic disease in the abdomen."  #4 Hepatitis C genotype 1A status post treatment with Viekira and Ribavarin for 24 weeks.   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   PLAN:  *** -Discussed pt labwork today, 08/15/20; all values are WNL except for Hgb at 11.2, HCT at 37.1, MCV at 72.7, MCH at 22.0, Glucose at 117, BUN at 30, Creatinine at 1.87, Total Protein at 8.2, GFR Est at 37. -Discussed 08/15/2020 TSH is WNL. -Discussed 07/24/2020 AFP tumor marker at 46.8 -The pt has no prohibitive toxicities from continuing 8 mg Lenvatinib daily at this time.  -Advised pt that if blood counts and symptoms are steady with 8 mg Lenvatinib for 1 month will increase to 12 mg.  -Advised pt that Lenvatinib can increase blood pressures. Recommend pt check blood pressures every other day & keep a journal.  -Recommend pt f/u with PCP to confirm that he is up to date with his Pneumonia vaccines.  -Recommend pt limit dietary sodium as much as possible.  -Will give annual flu vaccine in clinic today.  -Recommend pt continue f/u with Dr. Anselm Pancoast  -Will see back in 4 weeks with labs    FOLLOW UP: RTC with Dr. Irene Limbo in 4 weeks with labs    The total time spent in the appt was 20 minutes and more than 50% was on  counseling and direct patient cares.  All of the patient's questions were answered with apparent satisfaction. The patient knows to call the clinic with any problems,  questions or concerns.   [name] MD [place] AAHIVMS [facility] Hematology/Oncology Physician [facility]  (Office):       [phone] (Work cell):  [phone] (Fax):           [phone]  ***

## 2020-09-18 NOTE — Progress Notes (Signed)
HEMATOLOGY/ONCOLOGY CLINIC NOTE  Date of Service: 09/18/2020  Patient Care Team: Jani Gravel, MD as PCP - General (Internal Medicine)  CHIEF COMPLAINTS/PURPOSE OF CONSULTATION:  F/u for [facility] on Lenvatinib  HISTORY OF PRESENTING ILLNESS:   Aaron [name] is a wonderful 75 y.o. male who has been referred to [place] by Dr Jani Gravel for evaluation and management of monoclonal gammopathy of undetermined significance.  Patient has history of hypertension, diabetes, dyslipidemia, hepatocellular carcinoma (rx with TACE and percutaneous thermal ablation), hepatitis C status post treatment, iron deficiency anemia in 2014 treated with oral iron.  Patient had an SPEP done by his primary care physician on 10/04/2015 that showed an M spike of 0.3 g/dL. It was presumably will be done due to his complaints of fatigue. He has had no significant anemia. No new bone pains. No fevers/chills/drenching night sweats.  No new anemia..  Outside labs show hemoglobin of 12.4 with microcytosis with an MCV of 70.5, normal WBC count of 5.1k.  No evidence of hypercalcemia or significant renal failure on his outside labs.   INTERVAL HISTORY:  Aaron [name]  is here for his scheduled follow-up for [facility]. The patient's last visit with [place] was on 08/15/2020. The pt reports that he is doing well overall.  The pt reports no new concerns or symptoms. He notes he has been eating well and drinking 32-40 oz water daily. The pt notes that his BP is commonly 130-135/ 80-90 in the morning. He is taking his medication.   Lab results today 09/19/2020 of CBC w/diff and CMP is as follows: all values are WNL except for Hgb of 12.2, MCV of 72.8, MCH of 22.1, RDW of 15.8, Glucose of 125, BUN of 37, Creatinine of 2.30, Total Protein of 8.4, GFR est of 29.  On review of systems, pt denies n/v/d, mouth sores, abdominal pain/distention, blood/mucus/black stools, problems urinating and any other symptoms.  MEDICAL HISTORY:  Past Medical  History:  Diagnosis Date  . Arthritis   . Diabetes mellitus without complication ([place])    type II   . Elevated liver enzymes   . Fatty liver   . Hepatitis C    genotype 1A status post treatment with Viekira and Ribavarin for 24 weeks  . Hepatocellular carcinoma ([place]) 01/30/2014   Path  . History of colon polyps   . Hyperlipidemia   . Hypertension   . Iron deficiency anemia    hx of   . MGUS (monoclonal gammopathy of unknown significance)    Obesity Hepatocellular carcinoma treated with TACE and percutaneous thermal ablation by interventional radiology. Last MRI on 08/06/2015 showed slight decrease in the size of the ablation defect involving the right lobe of the liver area and no findings to suggest residual or recurrent hepatocellular carcinoma. Mild changes of liver cirrhosis. MRI Abd 06/24/2016- no evidence of recurrent HCC   Hepatitis C genotype 1A status post treatment with Viekira and Ribavarin for 24 weeks.  Monoclonal gammopathy of undetermined significance.  SURGICAL HISTORY:  Status post microwave ablation[October 2015] of liver lesion and TACE [July 2015] for [place]. EGD and colonoscopy in 2016   SOCIAL HISTORY: Social History   Socioeconomic History  . Marital status: Married    Spouse name: Not on file  . Number of children: Not on file  . Years of education: Not on file  . Highest education level: Not on file  Occupational History  . Not on file  Tobacco Use  . Smoking status: Former Smoker  Quit date: 01/30/1994    Years since quitting: 26.6  . Smokeless tobacco: Never Used  . Tobacco comment: stopped 25 yrs ago  Vaping Use  . Vaping Use: Never used  Substance and Sexual Activity  . Alcohol use: No    Comment: stopped 30 years ago   . Drug use: No  . Sexual activity: Not Currently  Other Topics Concern  . Not on file  Social History Narrative  . Not on file   Social Determinants of Health   Financial Resource Strain: Not on file  Food  Insecurity: Not on file  Transportation Needs: Not on file  Physical Activity: Not on file  Stress: Not on file  Social Connections: Not on file  Intimate Partner Violence: Not on file  Former smoker and smoked 1 pack per day for about 20 years starting at age 3 quit 13 years ago.  FAMILY HISTORY: No family history on file.  ALLERGIES:  has No Known Allergies.  MEDICATIONS:  Current Outpatient Medications  Medication Sig Dispense Refill  . ACCU-CHEK AVIVA PLUS test strip     . Accu-Chek Softclix Lancets lancets     . ALPRAZolam (XANAX) 0.5 MG tablet Take 1 tablet (0.5 mg total) by mouth as directed. Take 2 tablets (1.0 mg total) prior to MR scan.  Make take an additional 0.5 mg po if needed. (Patient not taking: Reported on 07/28/2019) 3 tablet 0  . amLODipine (NORVASC) 10 MG tablet Take 1 tablet (10 mg total) by mouth daily. 30 tablet 5  . aspirin EC 81 MG tablet Take 81 mg by mouth every morning.     . cholecalciferol (VITAMIN D) 1000 UNITS tablet Take 1,000 Units by mouth every morning.     . Insulin Glargine (LANTUS SOLOSTAR) 100 UNIT/ML Solostar Pen Inject 32 Units into the skin daily.    [name] Kitchen lenvatinib 8 mg daily dose (LENVIMA) 2 x 4 MG capsule Take 2 capsules (8 mg total) by mouth daily. 60 capsule 1  . lisinopril-hydrochlorothiazide (ZESTORETIC) 20-12.5 MG tablet TAKE 2 TABLETS BY MOUTH DAILY WITH BREAKFAST. 60 tablet 0  . metFORMIN (GLUCOPHAGE) 1000 MG tablet Take 1,000 mg by mouth 2 (two) times daily with a meal.    . Multiple Vitamin (MULTIVITAMIN WITH MINERALS) TABS tablet Take 1 tablet daily by mouth.    [name] Kitchen omeprazole (PRILOSEC) 20 MG capsule Take 1 capsule (20 mg total) by mouth daily. 30 capsule 0  . oxyCODONE (OXY IR/ROXICODONE) 5 MG immediate release tablet Take 1 tablet (5 mg total) by mouth every 6 (six) hours as needed for severe pain. 15 tablet 0  . PAZEO 0.7 % SOLN Place 1 drop into both eyes every morning.   3  . simvastatin (ZOCOR) 40 MG tablet Take 20 mg by mouth  every evening.      Current Facility-Administered Medications  Medication Dose Route Frequency Provider Last Rate Last Admin  . oxyCODONE (Oxy IR/ROXICODONE) immediate release tablet 5 mg  5 mg Oral Q6H PRN Jacqualine Mau, NP        REVIEW OF SYSTEMS:   10 Point review of Systems was done is negative except as noted above.   PHYSICAL EXAMINATION: ECOG FS:1 - Symptomatic but completely ambulatory  There were no vitals filed for this visit. Wt Readings from Last 3 Encounters:  08/15/20 238 lb 4.8 oz (108.1 kg)  07/24/20 234 lb (106.1 kg)  05/08/20 231 lb 14.4 oz (105.2 kg)   There is no height or weight on file  to calculate BMI.     GENERAL:alert, in no acute distress and comfortable SKIN: no acute rashes, no significant lesions EYES: conjunctiva are pink and non-injected, sclera anicteric OROPHARYNX: MMM, no exudates, no oropharyngeal erythema or ulceration NECK: supple, no JVD LYMPH:  no palpable lymphadenopathy in the cervical, axillary or inguinal regions LUNGS: clear to auscultation b/l with normal respiratory effort HEART: regular rate & rhythm ABDOMEN:  normoactive bowel sounds , non tender, not distended. Extremity: no pedal edema PSYCH: alert & oriented x 3 with fluent speech NEURO: no focal motor/sensory deficits    LABORATORY DATA:  I have reviewed the data as listed  . CBC Latest Ref Rng & Units 09/19/2020 08/15/2020 07/24/2020  WBC 4.0 - 10.5 K/uL 5.0 4.9 4.3  Hemoglobin 13.0 - 17.0 g/dL 12.2(L) 11.2(L) 10.9(L)  Hematocrit 39.0 - 52.0 % 40.2 37.1(L) 35.5(L)  Platelets 150 - 400 K/uL 266 253 285   . CBC    Component Value Date/Time   WBC 5.0 09/19/2020 0903   RBC 5.52 09/19/2020 0903   HGB 12.2 (L) 09/19/2020 0903   HGB 10.9 (L) 07/24/2020 1056   HGB 11.5 (L) 03/22/2017 1402   HCT 40.2 09/19/2020 0903   HCT 37.0 (L) 03/22/2017 1402   PLT 266 09/19/2020 0903   PLT 285 07/24/2020 1056   PLT 262 03/22/2017 1402   MCV 72.8 (L) 09/19/2020 0903    MCV 72.3 (L) 03/22/2017 1402   MCH 22.1 (L) 09/19/2020 0903   MCHC 30.3 09/19/2020 0903   RDW 15.8 (H) 09/19/2020 0903   RDW 15.6 (H) 03/22/2017 1402   LYMPHSABS 1.2 09/19/2020 0903   LYMPHSABS 1.8 03/22/2017 1402   MONOABS 0.5 09/19/2020 0903   MONOABS 0.4 03/22/2017 1402   EOSABS 0.1 09/19/2020 0903   EOSABS 0.1 03/22/2017 1402   BASOSABS 0.0 09/19/2020 0903   BASOSABS 0.0 03/22/2017 1402    . CMP Latest Ref Rng & Units 09/19/2020 08/15/2020 07/24/2020  Glucose 70 - 99 mg/dL 125(H) 117(H) 159(H)  BUN 8 - 23 mg/dL 37(H) 30(H) 40(H)  Creatinine 0.61 - 1.24 mg/dL 2.30(H) 1.87(H) 2.13(H)  Sodium 135 - 145 mmol/L 139 138 138  Potassium 3.5 - 5.1 mmol/L 4.8 4.8 4.8  Chloride 98 - 111 mmol/L 104 104 106  CO2 22 - 32 mmol/L _0 Calcium 8.9 - 10.3 mg/dL 9.8 9.7 10.2  Total Protein 6.5 - 8.1 g/dL 8.4(H) 8.2(H) 8.2(H)  Total Bilirubin 0.3 - 1.2 mg/dL 0.5 0.5 0.4  Alkaline Phos 38 - 126 U/L 58 66 75  AST 15 - 41 U/L _1 ALT 0 - 44 U/L _2 06/21/2019 MR ABDOMEN WWO CONTRAST (Accession [id number])     IFE 1  Comment   Comments: Immunofixation shows IgG monoclonal protein with kappa light chain  specificity.           RADIOGRAPHIC STUDIES:  MRI abd w and wo contrast: 12/17/2016: IMPRESSION: 1. Postprocedural changes of thermal ablation again noted in the right lobe of the liver, without definitive evidence to suggest local recurrence of disease. No new hepatic lesions are noted. 2. Additional incidental findings, as above.   Electronically Signed   By: Vinnie Langton M.D.   On: 12/17/2016 09:42    MRI abd w and wo contrast 01/26/2020 IMPRESSION: 1. Interval enlargement of multiple hepatic masses as detailed above, consistent with progression of multifocal hepatocellular carcinoma. There is evidence of prior ablation in hepatic segments VII and VIII. 2. No  evidence of metastatic disease in the abdomen.   Electronically Signed   By: Eddie Candle M.D.   On: 01/27/2020 15:51   ASSESSMENT & PLAN:   75 y.o. male with  #1 Monoclonal gammopathy of undetermined significance.  M protein 0.2 mg/dL immunofixation showing IgG monoclonal protein with kappa light chain specificity. SPEP 02/2016 - 0.3g/dl  No overt anemia.  No overt hypercalcemia. Stable CKD creatinine 1.6  #2 bone lucencies in the right radial mid midshaft and left humeral head. PET/CT did not show any hypermetabolic bone lesions. MRI left shoulder showed no evidence of metastatic disease or multiple myeloma. The x-ray findings appear to be areas of mild demineralization. Patient was seen by orthopedics and no additional recommendations were given.  Plan -Patient's anemia is stable. No significant change in renal function. No new bone pains. No hypercalcemia . No overall no overt evidence of progression of multiple myeloma. -Myeloma labs from today show stable M protein @ 0.2g/dl with no evidence of progression- consistent with MGUS.  #3 Hepatocellular carcinoma treated with TACE and percutaneous thermal ablation by interventional radiology.   Last MRI on 08/06/2015 showed slight decrease in the size of the ablation defect involving the right lobe of the liver area and no findings to suggest residual or recurrent hepatocellular carcinoma. Mild changes of liver cirrhosis.  01/16/2016 MRI Abdomen showed resolution of previously ablated lesion but a new 1.3 cm lesion was noted which was subsequently ablated under CT guidance by interventional radiology on 01/31/2016. Elevated transaminases on 02/01/2016 were likely related to his ablation. These have since resolved.   06/24/2016 MRI Abdomen showed no evidence of recurrent hepatocellular carcinoma . 12/17/2016 MRI Abdomen shows no evidence of recurrent hepatocellular carcinoma .  10/26/17 MRI Abdomen revealed Motion degraded images. Status post thermal ablation of the segment 8 lesion, without enhancement to suggest residual  viable tumor. Prior ablation of a segment 7 lesion, grossly unchanged. No convincing central enhancement  03/14/2019  MRI abdomen w and wo contrast revealed "1. New areas of restricted diffusion, surrounding nodular arterial phase enhancement and delayed washout surrounding the ablation zone defects anteriorly in segments 8 and 7 consistent with local recurrence of hepatocellular carcinoma. 2. The peripheral ablation zone defect laterally in segment 8 is unchanged. 3. No extrahepatic tumor identified."  10/02/2019 MRI Abdomen ([id number]) revealed "1. Progressive multifocal hepatocellular carcinoma, especially anteriorly around the ablated lesion in segments 4B and 8. There are additional new lesions in segments 2 and 3. No evidence of extrahepatic metastatic disease."  01/26/2020 MRI Abdomen ([id number]) revealed "1. Interval enlargement of multiple hepatic masses as detailed above, consistent with progression of multifocal hepatocellular carcinoma. There is evidence of prior ablation in hepatic segments VII and VIII. 2. No evidence of metastatic disease in the abdomen."  #4 Hepatitis C genotype 1A status post treatment with Viekira and Ribavarin for 24 weeks.   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   PLAN:  -Discussed pt labwork today, 09/19/2020; blood counts stable and Hgb improved. Slightly dehydrated due to kidney functions. -The pt has no prohibitive toxicities from continuing 8 mg Lenvatinib daily at this time.  -Will increase dose to standard 12 mg Lenvatinib daily due to toleration.  -Recommend pt try to increase water intake to 48-64 oz daily. -Schedule MRI without contrast in 2-3 weeks.(no contrast due to CKD) -Will see back in 4 weeks with labs.   FOLLOW UP: MRI abdomen in 3 weeks Return to clinic  with Dr. Irene Limbo with labs in 4 weeks   The total time spent in the appointment was 20 minutes and more than 50%  was on counseling and direct patient cares.  All of the patient's questions were answered with apparent satisfaction. The patient knows to call the clinic with any problems, questions or concerns.   [name] MD [place] AAHIVMS [facility] Hematology/Oncology Physician [facility]  (Office):       [phone] (Work cell):  [phone] (Fax):           [phone]   I, Reinaldo Raddle, am acting as scribe for Dr. [name], MD.    .I have reviewed the above documentation for accuracy and completeness, and I agree with the above. Brunetta Genera MD   Addendum   AFP tumor marker significantly increased to 744 from 36 concerning for [place] progression. Will f/u on MRI abd. Dose of Lenvima increased.  Brunetta Genera MD

## 2020-09-19 ENCOUNTER — Other Ambulatory Visit: Payer: Self-pay | Admitting: Hematology

## 2020-09-19 ENCOUNTER — Other Ambulatory Visit: Payer: Self-pay

## 2020-09-19 ENCOUNTER — Inpatient Hospital Stay: Payer: Medicare PPO

## 2020-09-19 ENCOUNTER — Inpatient Hospital Stay (HOSPITAL_BASED_OUTPATIENT_CLINIC_OR_DEPARTMENT_OTHER): Payer: Medicare PPO | Admitting: Hematology

## 2020-09-19 VITALS — BP 160/81 | HR 83 | Temp 97.8°F | Resp 18 | Ht 72.0 in | Wt 241.1 lb

## 2020-09-19 DIAGNOSIS — C220 Liver cell carcinoma: Secondary | ICD-10-CM

## 2020-09-19 DIAGNOSIS — D472 Monoclonal gammopathy: Secondary | ICD-10-CM | POA: Diagnosis not present

## 2020-09-19 DIAGNOSIS — D509 Iron deficiency anemia, unspecified: Secondary | ICD-10-CM | POA: Diagnosis not present

## 2020-09-19 DIAGNOSIS — Z8505 Personal history of malignant neoplasm of liver: Secondary | ICD-10-CM | POA: Diagnosis not present

## 2020-09-19 LAB — CBC WITH DIFFERENTIAL/PLATELET
Abs Immature Granulocytes: 0.01 10*3/uL (ref 0.00–0.07)
Basophils Absolute: 0 10*3/uL (ref 0.0–0.1)
Basophils Relative: 1 %
Eosinophils Absolute: 0.1 10*3/uL (ref 0.0–0.5)
Eosinophils Relative: 2 %
HCT: 40.2 % (ref 39.0–52.0)
Hemoglobin: 12.2 g/dL — ABNORMAL LOW (ref 13.0–17.0)
Immature Granulocytes: 0 %
Lymphocytes Relative: 24 %
Lymphs Abs: 1.2 10*3/uL (ref 0.7–4.0)
MCH: 22.1 pg — ABNORMAL LOW (ref 26.0–34.0)
MCHC: 30.3 g/dL (ref 30.0–36.0)
MCV: 72.8 fL — ABNORMAL LOW (ref 80.0–100.0)
Monocytes Absolute: 0.5 10*3/uL (ref 0.1–1.0)
Monocytes Relative: 10 %
Neutro Abs: 3.1 10*3/uL (ref 1.7–7.7)
Neutrophils Relative %: 63 %
Platelets: 266 10*3/uL (ref 150–400)
RBC: 5.52 MIL/uL (ref 4.22–5.81)
RDW: 15.8 % — ABNORMAL HIGH (ref 11.5–15.5)
WBC: 5 10*3/uL (ref 4.0–10.5)
nRBC: 0 % (ref 0.0–0.2)

## 2020-09-19 LAB — CMP (CANCER CENTER ONLY)
ALT: 17 U/L (ref 0–44)
AST: 25 U/L (ref 15–41)
Albumin: 4 g/dL (ref 3.5–5.0)
Alkaline Phosphatase: 58 U/L (ref 38–126)
Anion gap: 10 (ref 5–15)
BUN: 37 mg/dL — ABNORMAL HIGH (ref 8–23)
CO2: 25 mmol/L (ref 22–32)
Calcium: 9.8 mg/dL (ref 8.9–10.3)
Chloride: 104 mmol/L (ref 98–111)
Creatinine: 2.3 mg/dL — ABNORMAL HIGH (ref 0.61–1.24)
GFR, Estimated: 29 mL/min — ABNORMAL LOW (ref >60)
Glucose, Bld: 125 mg/dL — ABNORMAL HIGH (ref 70–99)
Potassium: 4.8 mmol/L (ref 3.5–5.1)
Sodium: 139 mmol/L (ref 135–145)
Total Bilirubin: 0.5 mg/dL (ref 0.3–1.2)
Total Protein: 8.4 g/dL — ABNORMAL HIGH (ref 6.5–8.1)

## 2020-09-19 MED ORDER — LENVATINIB (12 MG DAILY DOSE) 3 X 4 MG PO CPPK: 12.0000 mg | ORAL_CAPSULE | Freq: Every day | ORAL | 2 refills | Status: DC

## 2020-09-20 LAB — AFP TUMOR MARKER: AFP, Serum, Tumor Marker: 744 ng/mL — ABNORMAL HIGH (ref 0.0–8.3)

## 2020-09-24 MED FILL — LENVIMA 12 MG DAILY DOSE 4: 3 X 4 | 30 days supply | Qty: 90 | Fill #0

## 2020-10-14 DIAGNOSIS — E7800 Pure hypercholesterolemia, unspecified: Secondary | ICD-10-CM | POA: Diagnosis not present

## 2020-10-14 DIAGNOSIS — I10 Essential (primary) hypertension: Secondary | ICD-10-CM | POA: Diagnosis not present

## 2020-10-14 DIAGNOSIS — R946 Abnormal results of thyroid function studies: Secondary | ICD-10-CM | POA: Diagnosis not present

## 2020-10-14 DIAGNOSIS — E119 Type 2 diabetes mellitus without complications: Secondary | ICD-10-CM | POA: Diagnosis not present

## 2020-10-15 ENCOUNTER — Encounter: Payer: Self-pay | Admitting: *Deleted

## 2020-10-15 ENCOUNTER — Ambulatory Visit (HOSPITAL_COMMUNITY): Payer: Medicare PPO

## 2020-10-16 ENCOUNTER — Ambulatory Visit (HOSPITAL_COMMUNITY)
Admission: RE | Admit: 2020-10-16 | Discharge: 2020-10-16 | Disposition: A | Discharge status: Home / Self Care | Payer: Medicare PPO | Source: Ambulatory Visit | Attending: Hematology | Admitting: Hematology

## 2020-10-16 ENCOUNTER — Inpatient Hospital Stay: Payer: Medicare PPO | Admitting: Hematology

## 2020-10-16 ENCOUNTER — Inpatient Hospital Stay: Payer: Medicare PPO

## 2020-10-16 ENCOUNTER — Telehealth: Payer: Self-pay | Admitting: Hematology

## 2020-10-16 ENCOUNTER — Other Ambulatory Visit: Payer: Self-pay

## 2020-10-16 DIAGNOSIS — C220 Liver cell carcinoma: Secondary | ICD-10-CM | POA: Insufficient documentation

## 2020-10-16 DIAGNOSIS — K7689 Other specified diseases of liver: Secondary | ICD-10-CM | POA: Diagnosis not present

## 2020-10-16 NOTE — Telephone Encounter (Signed)
Scheduled appt per 2/22 sch msg- pt is aware of appt date and time

## 2020-10-21 ENCOUNTER — Other Ambulatory Visit: Payer: Self-pay | Admitting: *Deleted

## 2020-10-21 DIAGNOSIS — R946 Abnormal results of thyroid function studies: Secondary | ICD-10-CM | POA: Diagnosis not present

## 2020-10-21 DIAGNOSIS — E7800 Pure hypercholesterolemia, unspecified: Secondary | ICD-10-CM | POA: Diagnosis not present

## 2020-10-21 DIAGNOSIS — E119 Type 2 diabetes mellitus without complications: Secondary | ICD-10-CM | POA: Diagnosis not present

## 2020-10-21 DIAGNOSIS — I10 Essential (primary) hypertension: Secondary | ICD-10-CM | POA: Diagnosis not present

## 2020-10-21 DIAGNOSIS — C220 Liver cell carcinoma: Secondary | ICD-10-CM

## 2020-10-21 DIAGNOSIS — N183 Chronic kidney disease, stage 3 unspecified: Secondary | ICD-10-CM | POA: Diagnosis not present

## 2020-10-21 DIAGNOSIS — M25512 Pain in left shoulder: Secondary | ICD-10-CM | POA: Diagnosis not present

## 2020-10-21 MED FILL — LENVIMA 12 MG DAILY DOSE 4: 3 X 4 | 30 days supply | Qty: 90 | Fill #1

## 2020-10-22 NOTE — Progress Notes (Signed)
HEMATOLOGY/ONCOLOGY CLINIC NOTE  Date of Service: 10/23/2020  Patient Care Team: Jani Gravel, MD as PCP - General (Internal Medicine)  CHIEF COMPLAINTS/PURPOSE OF CONSULTATION:  F/u for [facility] on Lenvatinib  HISTORY OF PRESENTING ILLNESS:   Aaron [name] is a wonderful 75 y.o. male who has been referred to [place] by Dr Jani Gravel for evaluation and management of monoclonal gammopathy of undetermined significance.  Patient has history of hypertension, diabetes, dyslipidemia, hepatocellular carcinoma (rx with TACE and percutaneous thermal ablation), hepatitis C status post treatment, iron deficiency anemia in 2014 treated with oral iron.  Patient had an SPEP done by his primary care physician on 10/04/2015 that showed an M spike of 0.3 g/dL. It was presumably will be done due to his complaints of fatigue. He has had no significant anemia. No new bone pains. No fevers/chills/drenching night sweats.  No new anemia..  Outside labs show hemoglobin of 12.4 with microcytosis with an MCV of 70.5, normal WBC count of 5.1k.  No evidence of hypercalcemia or significant renal failure on his outside labs.   INTERVAL HISTORY:  Aaron [name] is here for his scheduled follow-up for [facility]. The patient's last visit with [place] was on 10/15/2020. The pt reports that he is doing well overall.  The pt reports that he has been doing the same since last visit. He notes no issues tolerating the increased dose of Lenvatinib. The pt notes he recently visited his diabetes management doctor who is controlling the pt's blood sugar very well.  Of note since the patient's last visit, pt has had MR Abd wo contrast ([id number]) on 10/16/2020, which revealed "Limited evaluation due to lack of intravenous contrast administration. Interval improvement of multifocal HCC in the right hepatic lobe, status post interval Y-90. However, there has been interval progression in the left hepatic lobe, with index lesions as above."  Lab  results today 10/23/2020 of CBC w/diff and CMP is as follows: all values are WNL except for Potassium of 5.3, Glucose of 103, BUN of 35, Creatinine of 2.10, Total Protein of 8.4, GFR est of 32, Hgb of 12.2, MCV of 73.9, MCH of 22.3, RDW of 15.9. 10/23/2020 Total Protein Urine dipstick negative. 10/23/2020 TSH in progress.  On review of systems, pt denies n/v/d, tingling/redness in hands/feet, decreased appetite, unexpected weight loss, leg swelling, changes in urination, infection issues, abdominal pain or tenderness to palpation, and any other symptoms.  MEDICAL HISTORY:  Past Medical History:  Diagnosis Date  . Arthritis   . Diabetes mellitus without complication ([place])    type II   . Elevated liver enzymes   . Fatty liver   . Hepatitis C    genotype 1A status post treatment with Viekira and Ribavarin for 24 weeks  . Hepatocellular carcinoma ([place]) 01/30/2014   Path  . History of colon polyps   . Hyperlipidemia   . Hypertension   . Iron deficiency anemia    hx of   . MGUS (monoclonal gammopathy of unknown significance)    Obesity Hepatocellular carcinoma treated with TACE and percutaneous thermal ablation by interventional radiology. Last MRI on 08/06/2015 showed slight decrease in the size of the ablation defect involving the right lobe of the liver area and no findings to suggest residual or recurrent hepatocellular carcinoma. Mild changes of liver cirrhosis. MRI Abd 06/24/2016- no evidence of recurrent HCC   Hepatitis C genotype 1A status post treatment with Viekira and Ribavarin for 24 weeks.  Monoclonal gammopathy of undetermined significance.  SURGICAL HISTORY:  Status post microwave ablation[October 2015] of liver lesion and TACE [July 2015] for [place]. EGD and colonoscopy in 2016   SOCIAL HISTORY: Social History   Socioeconomic History  . Marital status: Married    Spouse name: Not on file  . Number of children: Not on file  . Years of education: Not on file  . Highest  education level: Not on file  Occupational History  . Not on file  Tobacco Use  . Smoking status: Former Smoker    Quit date: 01/30/1994    Years since quitting: 26.7  . Smokeless tobacco: Never Used  . Tobacco comment: stopped 25 yrs ago  Vaping Use  . Vaping Use: Never used  Substance and Sexual Activity  . Alcohol use: No    Comment: stopped 30 years ago   . Drug use: No  . Sexual activity: Not Currently  Other Topics Concern  . Not on file  Social History Narrative  . Not on file   Social Determinants of Health   Financial Resource Strain: Not on file  Food Insecurity: Not on file  Transportation Needs: Not on file  Physical Activity: Not on file  Stress: Not on file  Social Connections: Not on file  Intimate Partner Violence: Not on file  Former smoker and smoked 1 pack per day for about 20 years starting at age 32 quit 46 years ago.  FAMILY HISTORY: No family history on file.  ALLERGIES:  has No Known Allergies.  MEDICATIONS:  Current Outpatient Medications  Medication Sig Dispense Refill  . ACCU-CHEK AVIVA PLUS test strip     . Accu-Chek Softclix Lancets lancets     . ALPRAZolam (XANAX) 0.5 MG tablet Take 1 tablet (0.5 mg total) by mouth as directed. Take 2 tablets (1.0 mg total) prior to MR scan.  Make take an additional 0.5 mg po if needed. (Patient not taking: Reported on 07/28/2019) 3 tablet 0  . amLODipine (NORVASC) 10 MG tablet Take 1 tablet (10 mg total) by mouth daily. 30 tablet 5  . aspirin EC 81 MG tablet Take 81 mg by mouth every morning.     . cholecalciferol (VITAMIN D) 1000 UNITS tablet Take 1,000 Units by mouth every morning.     . Insulin Glargine (LANTUS SOLOSTAR) 100 UNIT/ML Solostar Pen Inject 32 Units into the skin daily.    [name] Kitchen lenvatinib 12 mg daily dose (LENVIMA) 3 x 4 MG capsule Take 12 mg by mouth daily. 90 capsule 2  . lisinopril-hydrochlorothiazide (ZESTORETIC) 20-12.5 MG tablet TAKE 2 TABLETS BY MOUTH DAILY WITH BREAKFAST. 60 tablet 0  .  metFORMIN (GLUCOPHAGE) 1000 MG tablet Take 1,000 mg by mouth 2 (two) times daily with a meal.    . Multiple Vitamin (MULTIVITAMIN WITH MINERALS) TABS tablet Take 1 tablet daily by mouth.    [name] Kitchen omeprazole (PRILOSEC) 20 MG capsule Take 1 capsule (20 mg total) by mouth daily. 30 capsule 0  . oxyCODONE (OXY IR/ROXICODONE) 5 MG immediate release tablet Take 1 tablet (5 mg total) by mouth every 6 (six) hours as needed for severe pain. 15 tablet 0  . PAZEO 0.7 % SOLN Place 1 drop into both eyes every morning.   3  . simvastatin (ZOCOR) 40 MG tablet Take 20 mg by mouth every evening.      Current Facility-Administered Medications  Medication Dose Route Frequency Provider Last Rate Last Admin  . oxyCODONE (Oxy IR/ROXICODONE) immediate release tablet 5 mg  5 mg Oral Q6H PRN Jacqualine Mau, NP  REVIEW OF SYSTEMS:   10 Point review of Systems was done is negative except as noted above.   PHYSICAL EXAMINATION: ECOG FS:1 - Symptomatic but completely ambulatory  Vitals:   10/23/20 1009  BP: 139/77  Pulse: 91  Resp: 16  Temp: 97.9 F (36.6 C)  SpO2: 100%   Wt Readings from Last 3 Encounters:  10/23/20 243 lb (110.2 kg)  09/19/20 241 lb 1.6 oz (109.4 kg)  08/15/20 238 lb 4.8 oz (108.1 kg)   Body mass index is 32.96 kg/m.    GENERAL:alert, in no acute distress and comfortable SKIN: no acute rashes, no significant lesions EYES: conjunctiva are pink and non-injected, sclera anicteric OROPHARYNX: MMM, no exudates, no oropharyngeal erythema or ulceration NECK: supple, no JVD LYMPH:  no palpable lymphadenopathy in the cervical, axillary or inguinal regions LUNGS: clear to auscultation b/l with normal respiratory effort HEART: regular rate & rhythm ABDOMEN:  normoactive bowel sounds , non tender, not distended. Extremity: no pedal edema PSYCH: alert & oriented x 3 with fluent speech NEURO: no focal motor/sensory deficits  LABORATORY DATA:  I have reviewed the data as  listed  . CBC Latest Ref Rng & Units 10/23/2020 09/19/2020 08/15/2020  WBC 4.0 - 10.5 K/uL 5.4 5.0 4.9  Hemoglobin 13.0 - 17.0 g/dL 12.2(L) 12.2(L) 11.2(L)  Hematocrit 39.0 - 52.0 % 40.4 40.2 37.1(L)  Platelets 150 - 400 K/uL 244 266 253   . CBC    Component Value Date/Time   WBC 5.4 10/23/2020 0949   WBC 5.0 09/19/2020 0903   RBC 5.47 10/23/2020 0949   HGB 12.2 (L) 10/23/2020 0949   HGB 11.5 (L) 03/22/2017 1402   HCT 40.4 10/23/2020 0949   HCT 37.0 (L) 03/22/2017 1402   PLT 244 10/23/2020 0949   PLT 262 03/22/2017 1402   MCV 73.9 (L) 10/23/2020 0949   MCV 72.3 (L) 03/22/2017 1402   MCH 22.3 (L) 10/23/2020 0949   MCHC 30.2 10/23/2020 0949   RDW 15.9 (H) 10/23/2020 0949   RDW 15.6 (H) 03/22/2017 1402   LYMPHSABS 1.4 10/23/2020 0949   LYMPHSABS 1.8 03/22/2017 1402   MONOABS 0.5 10/23/2020 0949   MONOABS 0.4 03/22/2017 1402   EOSABS 0.1 10/23/2020 0949   EOSABS 0.1 03/22/2017 1402   BASOSABS 0.0 10/23/2020 0949   BASOSABS 0.0 03/22/2017 1402    . CMP Latest Ref Rng & Units 10/23/2020 09/19/2020 08/15/2020  Glucose 70 - 99 mg/dL 103(H) 125(H) 117(H)  BUN 8 - 23 mg/dL 35(H) 37(H) 30(H)  Creatinine 0.61 - 1.24 mg/dL 2.10(H) 2.30(H) 1.87(H)  Sodium 135 - 145 mmol/L 140 139 138  Potassium 3.5 - 5.1 mmol/L 5.3(H) 4.8 4.8  Chloride 98 - 111 mmol/L 104 104 104  CO2 22 - 32 mmol/L _0 Calcium 8.9 - 10.3 mg/dL 10.2 9.8 9.7  Total Protein 6.5 - 8.1 g/dL 8.4(H) 8.4(H) 8.2(H)  Total Bilirubin 0.3 - 1.2 mg/dL 0.6 0.5 0.5  Alkaline Phos 38 - 126 U/L 48 58 66  AST 15 - 41 U/L 34 25 26  ALT 0 - 44 U/L _1 06/21/2019 MR ABDOMEN WWO CONTRAST (Accession [id number])     IFE 1  Comment   Comments: Immunofixation shows IgG monoclonal protein with kappa light chain  specificity.           RADIOGRAPHIC STUDIES:  MRI abd w and wo contrast: 12/17/2016: IMPRESSION: 1. Postprocedural changes of thermal ablation again noted in the right lobe of the liver, without  definitive evidence to suggest local recurrence of disease. No new hepatic lesions are noted. 2. Additional incidental findings, as above.   Electronically Signed   By: Vinnie Langton M.D.   On: 12/17/2016 09:42    MRI abd w and wo contrast 01/26/2020 IMPRESSION: 1. Interval enlargement of multiple hepatic masses as detailed above, consistent with progression of multifocal hepatocellular carcinoma. There is evidence of prior ablation in hepatic segments VII and VIII. 2. No evidence of metastatic disease in the abdomen.   Electronically Signed   By: Eddie Candle M.D.   On: 01/27/2020 15:51   ASSESSMENT & PLAN:   75 y.o. male with  #1 Monoclonal gammopathy of undetermined significance.  M protein 0.2 mg/dL immunofixation showing IgG monoclonal protein with kappa light chain specificity. SPEP 02/2016 - 0.3g/dl  No overt anemia.  No overt hypercalcemia. Stable CKD creatinine 1.6  #2 bone lucencies in the right radial mid midshaft and left humeral head. PET/CT did not show any hypermetabolic bone lesions. MRI left shoulder showed no evidence of metastatic disease or multiple myeloma. The x-ray findings appear to be areas of mild demineralization. Patient was seen by orthopedics and no additional recommendations were given.  Plan -Patient's anemia is stable. No significant change in renal function. No new bone pains. No hypercalcemia . No overall no overt evidence of progression of multiple myeloma. -Myeloma labs from today show stable M protein @ 0.2g/dl with no evidence of progression- consistent with MGUS.  #3 Hepatocellular carcinoma treated with TACE and percutaneous thermal ablation by interventional radiology.   Last MRI on 08/06/2015 showed slight decrease in the size of the ablation defect involving the right lobe of the liver area and no findings to suggest residual or recurrent hepatocellular carcinoma. Mild changes of liver cirrhosis.  01/16/2016 MRI Abdomen  showed resolution of previously ablated lesion but a new 1.3 cm lesion was noted which was subsequently ablated under CT guidance by interventional radiology on 01/31/2016. Elevated transaminases on 02/01/2016 were likely related to his ablation. These have since resolved.   06/24/2016 MRI Abdomen showed no evidence of recurrent hepatocellular carcinoma . 12/17/2016 MRI Abdomen shows no evidence of recurrent hepatocellular carcinoma .  10/26/17 MRI Abdomen revealed Motion degraded images. Status post thermal ablation of the segment 8 lesion, without enhancement to suggest residual viable tumor. Prior ablation of a segment 7 lesion, grossly unchanged. No convincing central enhancement  03/14/2019  MRI abdomen w and wo contrast revealed "1. New areas of restricted diffusion, surrounding nodular arterial phase enhancement and delayed washout surrounding the ablation zone defects anteriorly in segments 8 and 7 consistent with local recurrence of hepatocellular carcinoma. 2. The peripheral ablation zone defect laterally in segment 8 is unchanged. 3. No extrahepatic tumor identified."  10/02/2019 MRI Abdomen ([id number]) revealed "1. Progressive multifocal hepatocellular carcinoma, especially anteriorly around the ablated lesion in segments 4B and 8. There are additional new lesions in segments 2 and 3. No evidence of extrahepatic metastatic disease."  01/26/2020 MRI Abdomen ([id number]) revealed "1. Interval enlargement of multiple hepatic masses as detailed above, consistent with progression of multifocal hepatocellular carcinoma. There is evidence of prior ablation in hepatic segments VII and VIII. 2. No evidence of metastatic disease in the abdomen."  #4 Hepatitis C genotype 1A status post treatment with Viekira and Ribavarin for 24 weeks.   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   PLAN:  -Discussed pt labwork today, 10/23/2020; blood  counts stable and chemistries  stable. TSH pending. Potassium slightly elevated. -Discussed pt MR Abd wo contrast ([id number]) on 10/16/2020; progression identified. Left liver node increased and those in left side increasing more. -Advised pt that Lisinopril can hold more potassium. Not a concern at this time, but advised to avoid excess orange juice and bananas. -The pt has no prohibitive toxicities from continuing 12 mg Lenvatinib daily at this time.  -Advised pt we will discuss if we can Y-90 the left side spots found on the recent MRI. The pt is agreeable to this.  -Recommend pt try to increase water intake to 48-64 oz daily. -Will see back in 4 weeks with labs.   FOLLOW UP: RTC with Dr Irene Limbo with labs in 4 weeks  The total time spent in the appointment was 30 minutes and more than 50% was on counseling and direct patient cares and co-ordination of cares with IR.   All of the patient's questions were answered with apparent satisfaction. The patient knows to call the clinic with any problems, questions or concerns.   [name] MD [place] AAHIVMS [facility] Hematology/Oncology Physician [facility]  (Office):       [phone] (Work cell):  [phone] (Fax):           [phone]   I, Reinaldo Raddle, am acting as scribe for Dr. [name], MD.   .I have reviewed the above documentation for accuracy and completeness, and I agree with the above. Brunetta Genera MD   ADDENDUM   Discussed with Dr Anselm Pancoast--- Patient will be setup for Y-90 treatment of left sided HCC lesions. Will hold Lenvatinib at this time.  Brunetta Genera MD

## 2020-10-23 ENCOUNTER — Inpatient Hospital Stay: Payer: Medicare PPO | Attending: Hematology | Admitting: Hematology

## 2020-10-23 ENCOUNTER — Inpatient Hospital Stay: Payer: Medicare PPO

## 2020-10-23 ENCOUNTER — Other Ambulatory Visit: Payer: Self-pay

## 2020-10-23 VITALS — BP 139/77 | HR 91 | Temp 97.9°F | Resp 16 | Ht 72.0 in | Wt 243.0 lb

## 2020-10-23 DIAGNOSIS — I10 Essential (primary) hypertension: Secondary | ICD-10-CM | POA: Diagnosis not present

## 2020-10-23 DIAGNOSIS — C220 Liver cell carcinoma: Secondary | ICD-10-CM

## 2020-10-23 DIAGNOSIS — B1920 Unspecified viral hepatitis C without hepatic coma: Secondary | ICD-10-CM | POA: Diagnosis not present

## 2020-10-23 DIAGNOSIS — Z79899 Other long term (current) drug therapy: Secondary | ICD-10-CM | POA: Insufficient documentation

## 2020-10-23 DIAGNOSIS — D472 Monoclonal gammopathy: Secondary | ICD-10-CM | POA: Insufficient documentation

## 2020-10-23 DIAGNOSIS — Z87891 Personal history of nicotine dependence: Secondary | ICD-10-CM | POA: Insufficient documentation

## 2020-10-23 DIAGNOSIS — D649 Anemia, unspecified: Secondary | ICD-10-CM | POA: Diagnosis not present

## 2020-10-23 LAB — CBC WITH DIFFERENTIAL (CANCER CENTER ONLY)
Abs Immature Granulocytes: 0.02 10*3/uL (ref 0.00–0.07)
Basophils Absolute: 0 10*3/uL (ref 0.0–0.1)
Basophils Relative: 0 %
Eosinophils Absolute: 0.1 10*3/uL (ref 0.0–0.5)
Eosinophils Relative: 2 %
HCT: 40.4 % (ref 39.0–52.0)
Hemoglobin: 12.2 g/dL — ABNORMAL LOW (ref 13.0–17.0)
Immature Granulocytes: 0 %
Lymphocytes Relative: 26 %
Lymphs Abs: 1.4 10*3/uL (ref 0.7–4.0)
MCH: 22.3 pg — ABNORMAL LOW (ref 26.0–34.0)
MCHC: 30.2 g/dL (ref 30.0–36.0)
MCV: 73.9 fL — ABNORMAL LOW (ref 80.0–100.0)
Monocytes Absolute: 0.5 10*3/uL (ref 0.1–1.0)
Monocytes Relative: 9 %
Neutro Abs: 3.3 10*3/uL (ref 1.7–7.7)
Neutrophils Relative %: 63 %
Platelet Count: 244 10*3/uL (ref 150–400)
RBC: 5.47 MIL/uL (ref 4.22–5.81)
RDW: 15.9 % — ABNORMAL HIGH (ref 11.5–15.5)
WBC Count: 5.4 10*3/uL (ref 4.0–10.5)
nRBC: 0 % (ref 0.0–0.2)

## 2020-10-23 LAB — CMP (CANCER CENTER ONLY)
ALT: 24 U/L (ref 0–44)
AST: 34 U/L (ref 15–41)
Albumin: 4.2 g/dL (ref 3.5–5.0)
Alkaline Phosphatase: 48 U/L (ref 38–126)
Anion gap: 11 (ref 5–15)
BUN: 35 mg/dL — ABNORMAL HIGH (ref 8–23)
CO2: 25 mmol/L (ref 22–32)
Calcium: 10.2 mg/dL (ref 8.9–10.3)
Chloride: 104 mmol/L (ref 98–111)
Creatinine: 2.1 mg/dL — ABNORMAL HIGH (ref 0.61–1.24)
GFR, Estimated: 32 mL/min — ABNORMAL LOW (ref >60)
Glucose, Bld: 103 mg/dL — ABNORMAL HIGH (ref 70–99)
Potassium: 5.3 mmol/L — ABNORMAL HIGH (ref 3.5–5.1)
Sodium: 140 mmol/L (ref 135–145)
Total Bilirubin: 0.6 mg/dL (ref 0.3–1.2)
Total Protein: 8.4 g/dL — ABNORMAL HIGH (ref 6.5–8.1)

## 2020-10-23 LAB — TSH: TSH: 1.813 u[IU]/mL (ref 0.320–4.118)

## 2020-10-23 LAB — TOTAL PROTEIN, URINE DIPSTICK: Protein, ur: NEGATIVE mg/dL

## 2020-10-24 ENCOUNTER — Ambulatory Visit
Admission: RE | Admit: 2020-10-24 | Discharge: 2020-10-24 | Disposition: A | Discharge status: Home / Self Care | Payer: Medicare PPO | Source: Ambulatory Visit | Attending: Physician Assistant | Admitting: Physician Assistant

## 2020-10-24 ENCOUNTER — Encounter: Payer: Self-pay | Admitting: *Deleted

## 2020-10-24 ENCOUNTER — Other Ambulatory Visit: Payer: Self-pay | Admitting: Hematology

## 2020-10-24 DIAGNOSIS — C220 Liver cell carcinoma: Secondary | ICD-10-CM

## 2020-10-24 HISTORY — PX: IR RADIOLOGIST EVAL & MGMT: IMG5224

## 2020-10-24 NOTE — Progress Notes (Signed)
Chief Complaint: Patient was consulted remotely today (TeleHealth) for multifocal hepatocellular carcinoma  Referring Physician(s): [name]  History of Present Illness: Aaron [name] is a 75 y.o. male with multifocal hepatocellular carcinoma.  I have been treating him for many years and we have done a variety of liver directed therapies.  Most recently, the patient underwent Y 90 radioembolization to the liver on 06/18/2020.  Patient is also being treated by Dr. Irene Limbo and the patient is taking Lenvatinib.  Patient has no new complaints at this time.  He tells me that he had significant weight loss and lack of appetite following his initial Y 90 treatment to the medial segment of left lower lobe on 03/01/2020.  He had no significant symptoms following treatment of the right hepatic lobe on 06/18/2020.  Patient recently had AFP tumor markers and repeat MRI of the liver.  Patient has chronic renal sufficiency and the renal function test has been slightly deteriorating over time.  Due to the creatinine level, the MRI was performed without contrast.  Past Medical History:  Diagnosis Date  . Arthritis   . Diabetes mellitus without complication ([place])    type II   . Elevated liver enzymes   . Fatty liver   . Hepatitis C    genotype 1A status post treatment with Viekira and Ribavarin for 24 weeks  . Hepatocellular carcinoma ([place]) 01/30/2014   Path  . History of colon polyps   . Hyperlipidemia   . Hypertension   . Iron deficiency anemia    hx of   . MGUS (monoclonal gammopathy of unknown significance)     Past Surgical History:  Procedure Laterality Date  . COLONOSCOPY    . ESOPHAGOGASTRODUODENOSCOPY  2016  . IR ANGIOGRAM SELECTIVE EACH ADDITIONAL VESSEL  04/04/2019  . IR ANGIOGRAM SELECTIVE EACH ADDITIONAL VESSEL  04/04/2019  . IR ANGIOGRAM SELECTIVE EACH ADDITIONAL VESSEL  04/04/2019  . IR ANGIOGRAM SELECTIVE EACH ADDITIONAL VESSEL  04/04/2019  . IR ANGIOGRAM SELECTIVE EACH  ADDITIONAL VESSEL  04/04/2019  . IR ANGIOGRAM SELECTIVE EACH ADDITIONAL VESSEL  04/04/2019  . IR ANGIOGRAM SELECTIVE EACH ADDITIONAL VESSEL  04/04/2019  . IR ANGIOGRAM SELECTIVE EACH ADDITIONAL VESSEL  05/16/2019  . IR ANGIOGRAM SELECTIVE EACH ADDITIONAL VESSEL  05/16/2019  . IR ANGIOGRAM SELECTIVE EACH ADDITIONAL VESSEL  05/16/2019  . IR ANGIOGRAM SELECTIVE EACH ADDITIONAL VESSEL  02/14/2020  . IR ANGIOGRAM SELECTIVE EACH ADDITIONAL VESSEL  02/14/2020  . IR ANGIOGRAM SELECTIVE EACH ADDITIONAL VESSEL  02/14/2020  . IR ANGIOGRAM SELECTIVE EACH ADDITIONAL VESSEL  02/14/2020  . IR ANGIOGRAM SELECTIVE EACH ADDITIONAL VESSEL  03/01/2020  . IR ANGIOGRAM SELECTIVE EACH ADDITIONAL VESSEL  03/01/2020  . IR ANGIOGRAM SELECTIVE EACH ADDITIONAL VESSEL  03/01/2020  . IR ANGIOGRAM SELECTIVE EACH ADDITIONAL VESSEL  03/01/2020  . IR ANGIOGRAM SELECTIVE EACH ADDITIONAL VESSEL  03/01/2020  . IR ANGIOGRAM SELECTIVE EACH ADDITIONAL VESSEL  06/18/2020  . IR ANGIOGRAM SELECTIVE EACH ADDITIONAL VESSEL  06/18/2020  . IR ANGIOGRAM VISCERAL SELECTIVE  04/04/2019  . IR ANGIOGRAM VISCERAL SELECTIVE  05/16/2019  . IR ANGIOGRAM VISCERAL SELECTIVE  02/14/2020  . IR ANGIOGRAM VISCERAL SELECTIVE  02/14/2020  . IR ANGIOGRAM VISCERAL SELECTIVE  03/01/2020  . IR ANGIOGRAM VISCERAL SELECTIVE  06/18/2020  . IR EMBO ARTERIAL NOT HEMORR HEMANG INC GUIDE ROADMAPPING  02/14/2020  . IR EMBO TUMOR ORGAN ISCHEMIA INFARCT INC GUIDE ROADMAPPING  04/04/2019  . IR EMBO TUMOR ORGAN ISCHEMIA INFARCT INC GUIDE ROADMAPPING  05/16/2019  . IR EMBO TUMOR  ORGAN ISCHEMIA INFARCT INC GUIDE ROADMAPPING  03/01/2020  . IR EMBO TUMOR ORGAN ISCHEMIA INFARCT INC GUIDE ROADMAPPING  06/18/2020  . IR GENERIC HISTORICAL  02/26/2016   IR RADIOLOGIST EVAL & MGMT 02/26/2016 Markus Daft, MD [facility] INTERV RAD  . IR GENERIC HISTORICAL  01/16/2016   IR RADIOLOGIST EVAL & MGMT 01/16/2016 Markus Daft, MD [facility] INTERV RAD  . IR GENERIC HISTORICAL  06/24/2016   IR RADIOLOGIST EVAL & MGMT 06/24/2016  Aletta Edouard, MD [facility] INTERV RAD  . IR RADIOLOGIST EVAL & MGMT  12/17/2016  . IR RADIOLOGIST EVAL & MGMT  06/16/2017  . IR RADIOLOGIST EVAL & MGMT  09/08/2017  . IR RADIOLOGIST EVAL & MGMT  10/26/2017  . IR RADIOLOGIST EVAL & MGMT  05/12/2018  . IR RADIOLOGIST EVAL & MGMT  07/26/2018  . IR RADIOLOGIST EVAL & MGMT  03/22/2019  . IR RADIOLOGIST EVAL & MGMT  04/11/2019  . IR RADIOLOGIST EVAL & MGMT  06/28/2019  . IR RADIOLOGIST EVAL & MGMT  10/10/2019  . IR RADIOLOGIST EVAL & MGMT  05/21/2020  . IR RADIOLOGIST EVAL & MGMT  06/12/2020  . IR US GUIDE VASC ACCESS LEFT  05/16/2019  . IR US GUIDE VASC ACCESS RIGHT  04/04/2019  . IR US GUIDE VASC ACCESS RIGHT  02/14/2020  . IR US GUIDE VASC ACCESS RIGHT  03/01/2020  . IR US GUIDE VASC ACCESS RIGHT  06/18/2020  . POLYPECTOMY    . RADIOFREQUENCY ABLATION N/A 07/21/2017   Procedure: CT MICROWAVE THERMAL ABLATION-LIVER;  Surgeon: Markus Daft, MD;  Location: [facility];  Service: Anesthesiology;  Laterality: N/A;  . RADIOFREQUENCY ABLATION N/A 06/29/2018   Procedure: CT MICROWAVE THERMAL ABLATION;  Surgeon: Markus Daft, MD;  Location: [facility];  Service: Anesthesiology;  Laterality: N/A;    Allergies: Patient has no known allergies.  Medications: Prior to Admission medications   Medication Sig Start Date End Date Taking? Authorizing Provider  ACCU-CHEK AVIVA PLUS test strip  11/19/19   [provider]  Accu-Chek Softclix Lancets lancets  09/12/19   [provider]  ALPRAZolam Duanne Moron) 0.5 MG tablet Take 1 tablet (0.5 mg total) by mouth as directed. Take 2 tablets (1.0 mg total) prior to MR scan.  Make take an additional 0.5 mg po if needed. Patient not taking: Reported on 07/28/2019 10/26/17   Markus Daft, MD  amLODipine (NORVASC) 10 MG tablet Take 1 tablet (10 mg total) by mouth daily. 07/24/20   Brunetta Genera, MD  aspirin EC 81 MG tablet Take 81 mg by mouth every morning.     [provider]  cholecalciferol (VITAMIN D) 1000 UNITS  tablet Take 1,000 Units by mouth every morning.     [provider]  Insulin Glargine (LANTUS SOLOSTAR) 100 UNIT/ML Solostar Pen Inject 32 Units into the skin daily.    [provider]  lenvatinib 12 mg daily dose (LENVIMA) 3 x 4 MG capsule Take 12 mg by mouth daily. 09/19/20   Brunetta Genera, MD  lisinopril-hydrochlorothiazide (ZESTORETIC) 20-12.5 MG tablet TAKE 2 TABLETS BY MOUTH DAILY WITH BREAKFAST. 08/02/20   Brunetta Genera, MD  metFORMIN (GLUCOPHAGE) 1000 MG tablet Take 1,000 mg by mouth 2 (two) times daily with a meal.    [provider]  Multiple Vitamin (MULTIVITAMIN WITH MINERALS) TABS tablet Take 1 tablet daily by mouth.    [provider]  omeprazole (PRILOSEC) 20 MG capsule Take 1 capsule (20 mg total) by mouth daily. 06/18/20   Joaquim Nam,  PA-C  oxyCODONE (OXY IR/ROXICODONE) 5 MG immediate release tablet Take 1 tablet (5 mg total) by mouth every 6 (six) hours as needed for severe pain. 03/01/20   Brynda Greathouse Sue-Ellen, PA  PAZEO 0.7 % SOLN Place 1 drop into both eyes every morning.  05/09/18   [provider]  simvastatin (ZOCOR) 40 MG tablet Take 20 mg by mouth every evening.     [provider]     No family history on file.  Social History   Socioeconomic History  . Marital status: Married    Spouse name: Not on file  . Number of children: Not on file  . Years of education: Not on file  . Highest education level: Not on file  Occupational History  . Not on file  Tobacco Use  . Smoking status: Former Smoker    Quit date: 01/30/1994    Years since quitting: 26.7  . Smokeless tobacco: Never Used  . Tobacco comment: stopped 25 yrs ago  Vaping Use  . Vaping Use: Never used  Substance and Sexual Activity  . Alcohol use: No    Comment: stopped 30 years ago   . Drug use: No  . Sexual activity: Not Currently  Other Topics Concern  . Not on file  Social History Narrative  . Not on file   Social  Determinants of Health   Financial Resource Strain: Not on file  Food Insecurity: Not on file  Transportation Needs: Not on file  Physical Activity: Not on file  Stress: Not on file  Social Connections: Not on file     Review of Systems  Constitutional: Negative.  Negative for appetite change and unexpected weight change.  Gastrointestinal: Negative for abdominal pain.    Physical Exam No direct physical exam was performed  Vital Signs: There were no vitals taken for this visit.  Imaging: MR Abdomen Wo Contrast  Result Date: 10/18/2020 CLINICAL DATA:  Multifocal HCC, status post Y 90 EXAM: MRI ABDOMEN WITHOUT CONTRAST TECHNIQUE: Multiplanar multisequence MR imaging was performed without the administration of intravenous contrast. COMPARISON:  05/28/2020 FINDINGS: Lower chest: Lung bases are clear. Hepatobiliary: Suboptimal evaluation due to lack of intravenous contrast administration. Interval treatment of lesions in the right hepatic lobe. Dominant 2.9 cm lesion in segment 8 is now dark on T2 (series 5/image 7), previously measuring 3.9 cm. Dominant 2.7 cm subcapsular lesion in the posterior right hepatic lobe is unchanged (series 5/image 11). However, there has been interval progression in the left hepatic lobe. Dominant 3.9 cm lesion in segment 4A (series 5/image 7), previously 2.9 cm. Dominant 2.8 cm lesion in series 3 (series 5/image 13), previously 2.2 cm. Gallbladder is unremarkable. No intrahepatic or extrahepatic ductal dilatation. Pancreas:  Within normal limits. Spleen:  Within normal limits. Adrenals/Urinary Tract:  Adrenal glands are within normal limits. Kidneys are within normal limits.  No hydronephrosis. Stomach/Bowel: Stomach is within normal limits. Visualized bowel is unremarkable. Vascular/Lymphatic:  No evidence of abdominal aortic aneurysm. Small upper abdominal lymph nodes, including a 10 mm short axis node in the porta hepatis (series 5/image 18). Other:  No  abdominal ascites. Musculoskeletal: No focal osseous lesions. IMPRESSION: Limited evaluation due to lack of intravenous contrast administration. Interval improvement of multifocal HCC in the right hepatic lobe, status post interval Y-90. However, there has been interval progression in the left hepatic lobe, with index lesions as above. Electronically Signed   By: Julian Hy M.D.   On: 10/18/2020 10:20    Labs:  CBC: Recent Labs    07/24/20 1056 08/15/20 0950 09/19/20 0903 10/23/20 0949  WBC 4.3 4.9 5.0 5.4  HGB 10.9* 11.2* 12.2* 12.2*  HCT 35.5* 37.1* 40.2 40.4  PLT 285 253 266 244    COAGS: Recent Labs    02/14/20 0855 03/01/20 0825 04/19/20 0832 06/18/20 0900  INR 0.9 1.0 1.0 1.0  APTT 30  --   --  35    BMP: Recent Labs    02/16/20 0825 03/01/20 0825 04/19/20 0832 05/08/20 0948 06/18/20 0900 07/24/20 1056 08/15/20 0950 09/19/20 0903 10/23/20 0949  NA 138 140 136 139   < > 138 138 139 140  K 5.0 4.8 4.6 4.8   < > 4.8 4.8 4.8 5.3*  CL 101 102 99 102   < > 106 104 104 104  CO2 28 25 25 28    < > 22 24 25 25   GLUCOSE 159* 136* 167* 107*   < > 159* 117* 125* 103*  BUN 25* 31* 27* 35*   < > 40* 30* 37* 35*  CALCIUM 9.3 9.6 9.6 9.7   < > 10.2 9.7 9.8 10.2  CREATININE 1.63* 1.65* 1.64* 2.08*   < > 2.13* 1.87* 2.30* 2.10*  GFRNONAA 41* 41* 41* 31*   < > 32* 37* 29* 32*  GFRAA 48* 47* 47* 36*  --   --   --   --   --    < > = values in this interval not displayed.    LIVER FUNCTION TESTS: Recent Labs    07/24/20 1056 08/15/20 0950 09/19/20 0903 10/23/20 0949  BILITOT 0.4 0.5 0.5 0.6  AST 31 26 25  34  ALT 19 19 17 24   ALKPHOS 75 66 58 48  PROT 8.2* 8.2* 8.4* 8.4*  ALBUMIN 3.8 3.8 4.0 4.2    TUMOR MARKERS: No results for input(s): AFPTM, CEA, CA199, CHROMGRNA in the last 8760 hours.  Assessment and Plan:  75 year old with multifocal hepatocellular carcinoma.  The patient has been treated with a variety of liver directed therapies and systemic  therapy.  AFP level has recently increased from 36 up to 744.  Recent MRI demonstrates significant progression of disease in the left hepatic lobe.  Patient has already undergone 2 treatments with Y 90 embolization.  He had treatment to the left hepatic lobe but it was primarily targeted at the large lesion involving the medial segment/ segment 4 region.  Subsequently, the patient had treatment of the entire right hepatic lobe.  AFP levels markedly decreased following the Y 90 radioembolization procedures.  I believe the patient would benefit from repeat treatment to the left hepatic lobe.  Patient's anatomy is complicated and we have had problems in the past with arterial dissection while trying to manipulate catheters in the hepatic arteries.  There is a short, trunk for left hepatic artery.  Based on the patient's anatomy, I think it may be beneficial to treat the left hepatic lobe by splitting the Y 90 dose on the day of treatment and treating the 2 main left hepatic artery branches separately.  Patient is very familiar with the catheter directed Y 90 radioembolization procedures.  Explained that his renal function continues to deteriorate which puts him at risk for contrast-induced nephrotoxicity.  We will try to hydrate the patient before and after the procedure in order to decrease the risk of contrast-induced nephrotoxicity.  Patient is agreeable to undergoing another Y90 radioembolization treatment.  We will coordinate stopping the patient's Lenvatinib approximately 2  weeks before the treatment.   Electronically Signed: Burman Riis 10/24/2020, 12:46 PM  I spent a total of    10 Minutes in remote  clinical consultation, greater than 50% of which was counseling/coordinating care for multifocal hepatocellular carcinoma.    Visit type: Audio only (telephone). Audio (no video) only due to Patient preference.. Alternative for in-person consultation at [facility], [address] [place], [place],  [place]. This visit type was conducted due to national recommendations for restrictions regarding the COVID-19 Pandemic (e.g. social distancing).  This format is felt to be most appropriate for this patient at this time.  All issues noted in this document were discussed and addressed.  Patient ID: Aaron [name], male   DOB: 06/30/1946, 75 y.o.   MRN: [id number]

## 2020-10-25 ENCOUNTER — Telehealth: Payer: Self-pay | Admitting: *Deleted

## 2020-10-25 NOTE — Telephone Encounter (Signed)
Please review for refill thank you. 

## 2020-10-25 NOTE — Telephone Encounter (Signed)
He should f/u with pcp to address anti HTN medications.

## 2020-10-25 NOTE — Telephone Encounter (Signed)
Per Dr. Irene Limbo - - contacted patient with directions to hold lenvatinib starting today in preparation for treatment. He states he will not take any more at this time.  Drs Irene Limbo and Anselm Pancoast informed

## 2020-10-30 ENCOUNTER — Other Ambulatory Visit (HOSPITAL_COMMUNITY): Payer: Self-pay | Admitting: Diagnostic Radiology

## 2020-10-30 DIAGNOSIS — C220 Liver cell carcinoma: Secondary | ICD-10-CM

## 2020-11-15 ENCOUNTER — Other Ambulatory Visit (HOSPITAL_COMMUNITY): Payer: Self-pay

## 2020-11-18 ENCOUNTER — Other Ambulatory Visit: Payer: Self-pay | Admitting: Radiology

## 2020-11-19 ENCOUNTER — Other Ambulatory Visit: Payer: Self-pay | Admitting: *Deleted

## 2020-11-19 ENCOUNTER — Other Ambulatory Visit (HOSPITAL_COMMUNITY): Payer: Self-pay | Admitting: Diagnostic Radiology

## 2020-11-19 ENCOUNTER — Ambulatory Visit (HOSPITAL_COMMUNITY)
Admission: RE | Admit: 2020-11-19 | Discharge: 2020-11-19 | Disposition: A | Discharge status: Home / Self Care | Payer: Medicare PPO | Source: Ambulatory Visit | Attending: Diagnostic Radiology | Admitting: Diagnostic Radiology

## 2020-11-19 ENCOUNTER — Encounter (HOSPITAL_COMMUNITY)
Admission: RE | Admit: 2020-11-19 | Discharge: 2020-11-19 | Disposition: A | Discharge status: Home / Self Care | Payer: Medicare PPO | Source: Ambulatory Visit | Attending: Diagnostic Radiology | Admitting: Diagnostic Radiology

## 2020-11-19 ENCOUNTER — Other Ambulatory Visit: Payer: Self-pay | Admitting: Radiology

## 2020-11-19 ENCOUNTER — Other Ambulatory Visit: Payer: Self-pay

## 2020-11-19 DIAGNOSIS — C220 Liver cell carcinoma: Secondary | ICD-10-CM | POA: Insufficient documentation

## 2020-11-19 DIAGNOSIS — E119 Type 2 diabetes mellitus without complications: Secondary | ICD-10-CM | POA: Insufficient documentation

## 2020-11-19 DIAGNOSIS — Z7982 Long term (current) use of aspirin: Secondary | ICD-10-CM | POA: Insufficient documentation

## 2020-11-19 DIAGNOSIS — K760 Fatty (change of) liver, not elsewhere classified: Secondary | ICD-10-CM | POA: Insufficient documentation

## 2020-11-19 DIAGNOSIS — B182 Chronic viral hepatitis C: Secondary | ICD-10-CM | POA: Diagnosis not present

## 2020-11-19 DIAGNOSIS — E785 Hyperlipidemia, unspecified: Secondary | ICD-10-CM | POA: Diagnosis not present

## 2020-11-19 DIAGNOSIS — Z79899 Other long term (current) drug therapy: Secondary | ICD-10-CM | POA: Insufficient documentation

## 2020-11-19 DIAGNOSIS — I10 Essential (primary) hypertension: Secondary | ICD-10-CM | POA: Diagnosis not present

## 2020-11-19 DIAGNOSIS — D509 Iron deficiency anemia, unspecified: Secondary | ICD-10-CM | POA: Insufficient documentation

## 2020-11-19 DIAGNOSIS — Z7984 Long term (current) use of oral hypoglycemic drugs: Secondary | ICD-10-CM | POA: Diagnosis not present

## 2020-11-19 DIAGNOSIS — Z794 Long term (current) use of insulin: Secondary | ICD-10-CM | POA: Insufficient documentation

## 2020-11-19 HISTORY — PX: IR EMBO TUMOR ORGAN ISCHEMIA INFARCT INC GUIDE ROADMAPPING: IMG5449

## 2020-11-19 HISTORY — PX: IR US GUIDE VASC ACCESS RIGHT: IMG2390

## 2020-11-19 HISTORY — PX: IR ANGIOGRAM VISCERAL SELECTIVE: IMG657

## 2020-11-19 HISTORY — PX: IR ANGIOGRAM SELECTIVE EACH ADDITIONAL VESSEL: IMG667

## 2020-11-19 LAB — GLUCOSE, CAPILLARY
Glucose-Capillary: 113 mg/dL — ABNORMAL HIGH (ref 70–99)
Glucose-Capillary: 143 mg/dL — ABNORMAL HIGH (ref 70–99)

## 2020-11-19 LAB — CBC WITH DIFFERENTIAL/PLATELET
Abs Immature Granulocytes: 0.07 10*3/uL (ref 0.00–0.07)
Basophils Absolute: 0 10*3/uL (ref 0.0–0.1)
Basophils Relative: 1 %
Eosinophils Absolute: 0.2 10*3/uL (ref 0.0–0.5)
Eosinophils Relative: 3 %
HCT: 38.7 % — ABNORMAL LOW (ref 39.0–52.0)
Hemoglobin: 11.9 g/dL — ABNORMAL LOW (ref 13.0–17.0)
Immature Granulocytes: 1 %
Lymphocytes Relative: 23 %
Lymphs Abs: 1.4 10*3/uL (ref 0.7–4.0)
MCH: 22.7 pg — ABNORMAL LOW (ref 26.0–34.0)
MCHC: 30.7 g/dL (ref 30.0–36.0)
MCV: 73.7 fL — ABNORMAL LOW (ref 80.0–100.0)
Monocytes Absolute: 0.5 10*3/uL (ref 0.1–1.0)
Monocytes Relative: 7 %
Neutro Abs: 3.9 10*3/uL (ref 1.7–7.7)
Neutrophils Relative %: 65 %
Platelets: 290 10*3/uL (ref 150–400)
RBC: 5.25 MIL/uL (ref 4.22–5.81)
RDW: 15.7 % — ABNORMAL HIGH (ref 11.5–15.5)
WBC: 6.1 10*3/uL (ref 4.0–10.5)
nRBC: 0 % (ref 0.0–0.2)

## 2020-11-19 LAB — COMPREHENSIVE METABOLIC PANEL
ALT: 23 U/L (ref 0–44)
AST: 32 U/L (ref 15–41)
Albumin: 4.3 g/dL (ref 3.5–5.0)
Alkaline Phosphatase: 59 U/L (ref 38–126)
Anion gap: 11 (ref 5–15)
BUN: 31 mg/dL — ABNORMAL HIGH (ref 8–23)
CO2: 24 mmol/L (ref 22–32)
Calcium: 9.4 mg/dL (ref 8.9–10.3)
Chloride: 102 mmol/L (ref 98–111)
Creatinine, Ser: 2.07 mg/dL — ABNORMAL HIGH (ref 0.61–1.24)
GFR, Estimated: 33 mL/min — ABNORMAL LOW (ref >60)
Glucose, Bld: 120 mg/dL — ABNORMAL HIGH (ref 70–99)
Potassium: 4 mmol/L (ref 3.5–5.1)
Sodium: 137 mmol/L (ref 135–145)
Total Bilirubin: 0.9 mg/dL (ref 0.3–1.2)
Total Protein: 8.7 g/dL — ABNORMAL HIGH (ref 6.5–8.1)

## 2020-11-19 LAB — PROTIME-INR
INR: 1 (ref 0.8–1.2)
Prothrombin Time: 12.6 seconds (ref 11.4–15.2)

## 2020-11-19 MED ORDER — IOHEXOL 300 MG/ML  SOLN
50.0000 mL | Freq: Once | INTRAMUSCULAR | Status: AC | PRN
  Administered 2020-11-19: 8 mL via INTRA_ARTERIAL

## 2020-11-19 MED ORDER — SODIUM CHLORIDE 0.9 % IV SOLN
8.0000 mg | Freq: Once | INTRAVENOUS | Status: AC
  Administered 2020-11-19: 8 mg via INTRAVENOUS
  Filled 2020-11-19: qty 4

## 2020-11-19 MED ORDER — SODIUM CHLORIDE 0.9 % IV SOLN
2.0000 g | Freq: Once | INTRAVENOUS | Status: AC
  Administered 2020-11-19: 2 g via INTRAVENOUS
  Filled 2020-11-19: qty 2

## 2020-11-19 MED ORDER — IOHEXOL 300 MG/ML  SOLN
100.0000 mL | Freq: Once | INTRAMUSCULAR | Status: AC | PRN
  Administered 2020-11-19: 30 mL via INTRA_ARTERIAL

## 2020-11-19 MED ORDER — OXYCODONE HCL 5 MG PO TABS: 5.0000 mg | ORAL_TABLET | ORAL | Status: DC | PRN

## 2020-11-19 MED ORDER — MIDAZOLAM HCL 2 MG/2ML IJ SOLN
INTRAMUSCULAR | Status: AC | PRN
  Administered 2020-11-19 (×4): 1 mg via INTRAVENOUS

## 2020-11-19 MED ORDER — MIDAZOLAM HCL 2 MG/2ML IJ SOLN
INTRAMUSCULAR | Status: AC
  Filled 2020-11-19: qty 4

## 2020-11-19 MED ORDER — PANTOPRAZOLE SODIUM 40 MG IV SOLR
40.0000 mg | Freq: Once | INTRAVENOUS | Status: AC
  Administered 2020-11-19: 40 mg via INTRAVENOUS
  Filled 2020-11-19: qty 40

## 2020-11-19 MED ORDER — LIDOCAINE HCL (PF) 1 % IJ SOLN
INTRAMUSCULAR | Status: AC | PRN
  Administered 2020-11-19: 10 mL via INTRADERMAL

## 2020-11-19 MED ORDER — SODIUM CHLORIDE 0.9 % IV SOLN: INTRAVENOUS | Status: DC

## 2020-11-19 MED ORDER — YTTRIUM 90 INJECTION
18.4400 | INJECTION | Freq: Once | INTRAVENOUS | Status: AC
  Administered 2020-11-19: 18.44 via INTRA_ARTERIAL

## 2020-11-19 MED ORDER — LIDOCAINE HCL 1 % IJ SOLN
INTRAMUSCULAR | Status: AC
  Filled 2020-11-19: qty 20

## 2020-11-19 MED ORDER — FENTANYL CITRATE (PF) 100 MCG/2ML IJ SOLN
INTRAMUSCULAR | Status: AC
  Filled 2020-11-19: qty 4

## 2020-11-19 MED ORDER — DEXAMETHASONE SODIUM PHOSPHATE 10 MG/ML IJ SOLN
8.0000 mg | Freq: Once | INTRAMUSCULAR | Status: AC
  Administered 2020-11-19: 8 mg via INTRAVENOUS
  Filled 2020-11-19: qty 0.8

## 2020-11-19 MED ORDER — FENTANYL CITRATE (PF) 100 MCG/2ML IJ SOLN
INTRAMUSCULAR | Status: AC | PRN
  Administered 2020-11-19 (×2): 50 ug via INTRAVENOUS

## 2020-11-19 NOTE — Procedures (Signed)
Interventional Radiology Procedure:   Indications: Multifocal hepatocellular carcinoma  Procedure: Y90 Radioembolization to left hepatic lobe  Findings: Catheter placement in left hepatic artery branch (lateral segments).  Complete administration of the dose.  Right groin closure with Exoseal closure device.    Complications: None     EBL: less than 10 ml  Plan: Bedrest 4 hours, go to nuclear medicine for imaging and anticipate discharge in 4 hours.    Aubreyana Saltz R. Anselm Pancoast, MD  Pager: [phone]

## 2020-11-19 NOTE — Progress Notes (Incomplete)
HEMATOLOGY/ONCOLOGY CLINIC NOTE  Date of Service: 11/19/2020  Patient Care Team: Jani Gravel, MD as PCP - General (Internal Medicine)  CHIEF COMPLAINTS/PURPOSE OF CONSULTATION:  F/u for [facility] on Lenvatinib  HISTORY OF PRESENTING ILLNESS:   Aaron [name] is a wonderful 75 y.o. male who has been referred to [place] by Dr Jani Gravel for evaluation and management of monoclonal gammopathy of undetermined significance.  Patient has history of hypertension, diabetes, dyslipidemia, hepatocellular carcinoma (rx with TACE and percutaneous thermal ablation), hepatitis C status post treatment, iron deficiency anemia in 2014 treated with oral iron.  Patient had an SPEP done by his primary care physician on 10/04/2015 that showed an M spike of 0.3 g/dL. It was presumably will be done due to his complaints of fatigue. He has had no significant anemia. No new bone pains. No fevers/chills/drenching night sweats.  No new anemia..  Outside labs show hemoglobin of 12.4 with microcytosis with an MCV of 70.5, normal WBC count of 5.1k.  No evidence of hypercalcemia or significant renal failure on his outside labs.   INTERVAL HISTORY:  Aaron [name] is here for his scheduled follow-up for [facility]. The patient's last visit with [place] was on 10/23/2020. The pt reports that he is doing well overall.  The pt reports ***  Lab results today 11/20/2020 of CBC w/diff and CMP is as follows: all values are WNL except for ***  On review of systems, pt reports *** and denies *** and any other symptoms.  MEDICAL HISTORY:  Past Medical History:  Diagnosis Date  . Arthritis   . Diabetes mellitus without complication ([place])    type II   . Elevated liver enzymes   . Fatty liver   . Hepatitis C    genotype 1A status post treatment with Viekira and Ribavarin for 24 weeks  . Hepatocellular carcinoma ([place]) 01/30/2014   Path  . History of colon polyps   . Hyperlipidemia   . Hypertension   . Iron deficiency anemia    hx of    . MGUS (monoclonal gammopathy of unknown significance)    Obesity Hepatocellular carcinoma treated with TACE and percutaneous thermal ablation by interventional radiology. Last MRI on 08/06/2015 showed slight decrease in the size of the ablation defect involving the right lobe of the liver area and no findings to suggest residual or recurrent hepatocellular carcinoma. Mild changes of liver cirrhosis. MRI Abd 06/24/2016- no evidence of recurrent HCC   Hepatitis C genotype 1A status post treatment with Viekira and Ribavarin for 24 weeks.  Monoclonal gammopathy of undetermined significance.  SURGICAL HISTORY:  Status post microwave ablation[October 2015] of liver lesion and TACE [July 2015] for [place]. EGD and colonoscopy in 2016   SOCIAL HISTORY: Social History   Socioeconomic History  . Marital status: Married    Spouse name: Not on file  . Number of children: Not on file  . Years of education: Not on file  . Highest education level: Not on file  Occupational History  . Not on file  Tobacco Use  . Smoking status: Former Smoker    Quit date: 01/30/1994    Years since quitting: 26.8  . Smokeless tobacco: Never Used  . Tobacco comment: stopped 25 yrs ago  Vaping Use  . Vaping Use: Never used  Substance and Sexual Activity  . Alcohol use: No    Comment: stopped 30 years ago   . Drug use: No  . Sexual activity: Not Currently  Other Topics Concern  .  Not on file  Social History Narrative  . Not on file   Social Determinants of Health   Financial Resource Strain: Not on file  Food Insecurity: Not on file  Transportation Needs: Not on file  Physical Activity: Not on file  Stress: Not on file  Social Connections: Not on file  Intimate Partner Violence: Not on file  Former smoker and smoked 1 pack per day for about 20 years starting at age 59 quit 65 years ago.  FAMILY HISTORY: No family history on file.  ALLERGIES:  has No Known Allergies.  MEDICATIONS:  Current  Outpatient Medications  Medication Sig Dispense Refill  . ACCU-CHEK AVIVA PLUS test strip     . Accu-Chek Softclix Lancets lancets     . ALPRAZolam (XANAX) 0.5 MG tablet Take 1 tablet (0.5 mg total) by mouth as directed. Take 2 tablets (1.0 mg total) prior to MR scan.  Make take an additional 0.5 mg po if needed. (Patient not taking: No sig reported) 3 tablet 0  . amLODipine (NORVASC) 10 MG tablet Take 1 tablet (10 mg total) by mouth daily. 30 tablet 5  . aspirin EC 81 MG tablet Take 81 mg by mouth every morning.     . cholecalciferol (VITAMIN D) 1000 UNITS tablet Take 1,000 Units by mouth every morning.     . Insulin Glargine (LANTUS SOLOSTAR) 100 UNIT/ML Solostar Pen Inject 32 Units into the skin daily.    [name] Kitchen lenvatinib 12 mg daily dose (LENVIMA) 3 x 4 MG capsule Take 12 mg by mouth daily. 90 capsule 2  . lisinopril-hydrochlorothiazide (ZESTORETIC) 20-12.5 MG tablet TAKE 2 TABLETS BY MOUTH DAILY WITH BREAKFAST. 60 tablet 0  . metFORMIN (GLUCOPHAGE) 1000 MG tablet Take 1,000 mg by mouth 2 (two) times daily with a meal.    . Multiple Vitamin (MULTIVITAMIN WITH MINERALS) TABS tablet Take 1 tablet daily by mouth.    [name] Kitchen omeprazole (PRILOSEC) 20 MG capsule Take 1 capsule (20 mg total) by mouth daily. 30 capsule 0  . oxyCODONE (OXY IR/ROXICODONE) 5 MG immediate release tablet Take 1 tablet (5 mg total) by mouth every 6 (six) hours as needed for severe pain. 15 tablet 0  . PAZEO 0.7 % SOLN Place 1 drop into both eyes every morning.   3  . simvastatin (ZOCOR) 40 MG tablet Take 20 mg by mouth every evening.      Current Facility-Administered Medications  Medication Dose Route Frequency Provider Last Rate Last Admin  . oxyCODONE (Oxy IR/ROXICODONE) immediate release tablet 5 mg  5 mg Oral Q6H PRN Jacqualine Mau, NP       Facility-Administered Medications Ordered in Other Visits  Medication Dose Route Frequency Provider Last Rate Last Admin  . 0.9 %  sodium chloride infusion   Intravenous  Continuous Allred, Darrell K, PA-C   Stopped at 11/19/20 1607  . fentaNYL (SUBLIMAZE) 100 MCG/2ML injection           . midazolam (VERSED) 2 MG/2ML injection           . oxyCODONE (Oxy IR/ROXICODONE) immediate release tablet 5 mg  5 mg Oral Q3H PRN Markus Daft, MD        REVIEW OF SYSTEMS:   10 Point review of Systems was done is negative except as noted above.   PHYSICAL EXAMINATION: ECOG FS:1 - Symptomatic but completely ambulatory  There were no vitals filed for this visit. Wt Readings from Last 3 Encounters:  10/23/20 243 lb (110.2 kg)  09/19/20 241  lb 1.6 oz (109.4 kg)  08/15/20 238 lb 4.8 oz (108.1 kg)   There is no height or weight on file to calculate BMI.    *** GENERAL:alert, in no acute distress and comfortable SKIN: no acute rashes, no significant lesions EYES: conjunctiva are pink and non-injected, sclera anicteric OROPHARYNX: MMM, no exudates, no oropharyngeal erythema or ulceration NECK: supple, no JVD LYMPH:  no palpable lymphadenopathy in the cervical, axillary or inguinal regions LUNGS: clear to auscultation b/l with normal respiratory effort HEART: regular rate & rhythm ABDOMEN:  normoactive bowel sounds , non tender, not distended. Extremity: no pedal edema PSYCH: alert & oriented x 3 with fluent speech NEURO: no focal motor/sensory deficits  LABORATORY DATA:  I have reviewed the data as listed  CBC Latest Ref Rng & Units 11/19/2020 10/23/2020 09/19/2020  WBC 4.0 - 10.5 K/uL 6.1 5.4 5.0  Hemoglobin 13.0 - 17.0 g/dL 11.9(L) 12.2(L) 12.2(L)  Hematocrit 39.0 - 52.0 % 38.7(L) 40.4 40.2  Platelets 150 - 400 K/uL 290 244 266    CBC    Component Value Date/Time   WBC 6.1 11/19/2020 0835   RBC 5.25 11/19/2020 0835   HGB 11.9 (L) 11/19/2020 0835   HGB 12.2 (L) 10/23/2020 0949   HGB 11.5 (L) 03/22/2017 1402   HCT 38.7 (L) 11/19/2020 0835   HCT 37.0 (L) 03/22/2017 1402   PLT 290 11/19/2020 0835   PLT 244 10/23/2020 0949   PLT 262 03/22/2017 1402   MCV 73.7  (L) 11/19/2020 0835   MCV 72.3 (L) 03/22/2017 1402   MCH 22.7 (L) 11/19/2020 0835   MCHC 30.7 11/19/2020 0835   RDW 15.7 (H) 11/19/2020 0835   RDW 15.6 (H) 03/22/2017 1402   LYMPHSABS 1.4 11/19/2020 0835   LYMPHSABS 1.8 03/22/2017 1402   MONOABS 0.5 11/19/2020 0835   MONOABS 0.4 03/22/2017 1402   EOSABS 0.2 11/19/2020 0835   EOSABS 0.1 03/22/2017 1402   BASOSABS 0.0 11/19/2020 0835   BASOSABS 0.0 03/22/2017 1402    . CMP Latest Ref Rng & Units 11/19/2020 10/23/2020 09/19/2020  Glucose 70 - 99 mg/dL 120(H) 103(H) 125(H)  BUN 8 - 23 mg/dL 31(H) 35(H) 37(H)  Creatinine 0.61 - 1.24 mg/dL 2.07(H) 2.10(H) 2.30(H)  Sodium 135 - 145 mmol/L 137 140 139  Potassium 3.5 - 5.1 mmol/L 4.0 5.3(H) 4.8  Chloride 98 - 111 mmol/L 102 104 104  CO2 22 - 32 mmol/L 24 25 25   Calcium 8.9 - 10.3 mg/dL 9.4 10.2 9.8  Total Protein 6.5 - 8.1 g/dL 8.7(H) 8.4(H) 8.4(H)  Total Bilirubin 0.3 - 1.2 mg/dL 0.9 0.6 0.5  Alkaline Phos 38 - 126 U/L 59 48 58  AST 15 - 41 U/L 32 34 25  ALT 0 - 44 U/L 23 24 17     06/21/2019 MR ABDOMEN WWO CONTRAST (Accession [id number])     IFE 1  Comment   Comments: Immunofixation shows IgG monoclonal protein with kappa light chain  specificity.           RADIOGRAPHIC STUDIES:  MRI abd w and wo contrast: 12/17/2016: IMPRESSION: 1. Postprocedural changes of thermal ablation again noted in the right lobe of the liver, without definitive evidence to suggest local recurrence of disease. No new hepatic lesions are noted. 2. Additional incidental findings, as above.   Electronically Signed   By: Vinnie Langton M.D.   On: 12/17/2016 09:42    MRI abd w and wo contrast 01/26/2020 IMPRESSION: 1. Interval enlargement of multiple hepatic masses as detailed above,  consistent with progression of multifocal hepatocellular carcinoma. There is evidence of prior ablation in hepatic segments VII and VIII. 2. No evidence of metastatic disease in the  abdomen.   Electronically Signed   By: Eddie Candle M.D.   On: 01/27/2020 15:51   ASSESSMENT & PLAN:   75 y.o. male with  #1 Monoclonal gammopathy of undetermined significance.  M protein 0.2 mg/dL immunofixation showing IgG monoclonal protein with kappa light chain specificity. SPEP 02/2016 - 0.3g/dl  No overt anemia.  No overt hypercalcemia. Stable CKD creatinine 1.6  #2 bone lucencies in the right radial mid midshaft and left humeral head. PET/CT did not show any hypermetabolic bone lesions. MRI left shoulder showed no evidence of metastatic disease or multiple myeloma. The x-ray findings appear to be areas of mild demineralization. Patient was seen by orthopedics and no additional recommendations were given.  Plan -Patient's anemia is stable. No significant change in renal function. No new bone pains. No hypercalcemia . No overall no overt evidence of progression of multiple myeloma. -Myeloma labs from today show stable M protein @ 0.2g/dl with no evidence of progression- consistent with MGUS.  #3 Hepatocellular carcinoma treated with TACE and percutaneous thermal ablation by interventional radiology.   Last MRI on 08/06/2015 showed slight decrease in the size of the ablation defect involving the right lobe of the liver area and no findings to suggest residual or recurrent hepatocellular carcinoma. Mild changes of liver cirrhosis.  01/16/2016 MRI Abdomen showed resolution of previously ablated lesion but a new 1.3 cm lesion was noted which was subsequently ablated under CT guidance by interventional radiology on 01/31/2016. Elevated transaminases on 02/01/2016 were likely related to his ablation. These have since resolved.   06/24/2016 MRI Abdomen showed no evidence of recurrent hepatocellular carcinoma . 12/17/2016 MRI Abdomen shows no evidence of recurrent hepatocellular carcinoma .  10/26/17 MRI Abdomen revealed Motion degraded images. Status post thermal ablation of the segment 8  lesion, without enhancement to suggest residual viable tumor. Prior ablation of a segment 7 lesion, grossly unchanged. No convincing central enhancement  03/14/2019  MRI abdomen w and wo contrast revealed "1. New areas of restricted diffusion, surrounding nodular arterial phase enhancement and delayed washout surrounding the ablation zone defects anteriorly in segments 8 and 7 consistent with local recurrence of hepatocellular carcinoma. 2. The peripheral ablation zone defect laterally in segment 8 is unchanged. 3. No extrahepatic tumor identified."  10/02/2019 MRI Abdomen ([id number]) revealed "1. Progressive multifocal hepatocellular carcinoma, especially anteriorly around the ablated lesion in segments 4B and 8. There are additional new lesions in segments 2 and 3. No evidence of extrahepatic metastatic disease."  01/26/2020 MRI Abdomen ([id number]) revealed "1. Interval enlargement of multiple hepatic masses as detailed above, consistent with progression of multifocal hepatocellular carcinoma. There is evidence of prior ablation in hepatic segments VII and VIII. 2. No evidence of metastatic disease in the abdomen."  #4 Hepatitis C genotype 1A status post treatment with Viekira and Ribavarin for 24 weeks.   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   PLAN:  -Discussed pt labwork today, 11/20/2020; ***   -The pt has no prohibitive toxicities from continuing 12 mg Lenvatinib daily at this time.  -Recommend pt try to increase water intake to 48-64 oz daily. -Will see back in ***   FOLLOW UP: ***   The total time spent in the appointment was *** minutes and more than 50% was on counseling and direct patient  cares.    All of the patient's questions were answered with apparent satisfaction. The patient knows to call the clinic with any problems, questions or concerns.   [name] MD [place] AAHIVMS [facility]  [facility] Hematology/Oncology Physician [facility]  (Office):       [phone] (Work cell):  [phone] (Fax):           [phone]   I, Reinaldo Raddle, am acting as scribe for Dr. [name], MD.

## 2020-11-19 NOTE — H&P (Signed)
Referring Physician(s): Arnold  Supervising Physician: Markus Daft  Patient Status:  [facility] [name]   Chief Complaint: Multifocal hepatocellular carcinoma   Subjective: Patient familiar to IR service from multiple liver directed therapies for his multifocal hepatocellular carcinoma.  Most recently, he underwent Y 90 radioembolization to the liver on 06/18/2020.  He had significant weight loss and lack of appetite following his initial Y 90 treatment to the medial segment of the left lower lobe on 03/01/2020.  No significant symptoms following treatment of the right hepatic lobe on 06/18/2020.  He has slightly worsening chronic renal insufficiency.  His most recent AFP 2 months ago was 744.  MRI of the abdomen on 10/16/2020 revealed interval improvement of multifocal HCC in the right hepatic lobe but interval progression in the left hepatic lobe.  Following recent discussions with Dr. Anselm Pancoast he was deemed an appropriate candidate for additional left hepatic lobe Y 90 radioembolization and presents today for the procedure.  He currently denies fever, headache, chest pain, dyspnea, cough, abdominal/back pain, nausea, vomiting bleeding.  Additional history as below.  Past Medical History:  Diagnosis Date  . Arthritis   . Diabetes mellitus without complication ([place])    type II   . Elevated liver enzymes   . Fatty liver   . Hepatitis C    genotype 1A status post treatment with Viekira and Ribavarin for 24 weeks  . Hepatocellular carcinoma ([place]) 01/30/2014   Path  . History of colon polyps   . Hyperlipidemia   . Hypertension   . Iron deficiency anemia    hx of   . MGUS (monoclonal gammopathy of unknown significance)    Past Surgical History:  Procedure Laterality Date  . COLONOSCOPY    . ESOPHAGOGASTRODUODENOSCOPY  2016  . IR ANGIOGRAM SELECTIVE EACH ADDITIONAL VESSEL  04/04/2019  . IR ANGIOGRAM SELECTIVE EACH ADDITIONAL VESSEL  04/04/2019  . IR ANGIOGRAM SELECTIVE EACH ADDITIONAL VESSEL  04/04/2019   . IR ANGIOGRAM SELECTIVE EACH ADDITIONAL VESSEL  04/04/2019  . IR ANGIOGRAM SELECTIVE EACH ADDITIONAL VESSEL  04/04/2019  . IR ANGIOGRAM SELECTIVE EACH ADDITIONAL VESSEL  04/04/2019  . IR ANGIOGRAM SELECTIVE EACH ADDITIONAL VESSEL  04/04/2019  . IR ANGIOGRAM SELECTIVE EACH ADDITIONAL VESSEL  05/16/2019  . IR ANGIOGRAM SELECTIVE EACH ADDITIONAL VESSEL  05/16/2019  . IR ANGIOGRAM SELECTIVE EACH ADDITIONAL VESSEL  05/16/2019  . IR ANGIOGRAM SELECTIVE EACH ADDITIONAL VESSEL  02/14/2020  . IR ANGIOGRAM SELECTIVE EACH ADDITIONAL VESSEL  02/14/2020  . IR ANGIOGRAM SELECTIVE EACH ADDITIONAL VESSEL  02/14/2020  . IR ANGIOGRAM SELECTIVE EACH ADDITIONAL VESSEL  02/14/2020  . IR ANGIOGRAM SELECTIVE EACH ADDITIONAL VESSEL  03/01/2020  . IR ANGIOGRAM SELECTIVE EACH ADDITIONAL VESSEL  03/01/2020  . IR ANGIOGRAM SELECTIVE EACH ADDITIONAL VESSEL  03/01/2020  . IR ANGIOGRAM SELECTIVE EACH ADDITIONAL VESSEL  03/01/2020  . IR ANGIOGRAM SELECTIVE EACH ADDITIONAL VESSEL  03/01/2020  . IR ANGIOGRAM SELECTIVE EACH ADDITIONAL VESSEL  06/18/2020  . IR ANGIOGRAM SELECTIVE EACH ADDITIONAL VESSEL  06/18/2020  . IR ANGIOGRAM VISCERAL SELECTIVE  04/04/2019  . IR ANGIOGRAM VISCERAL SELECTIVE  05/16/2019  . IR ANGIOGRAM VISCERAL SELECTIVE  02/14/2020  . IR ANGIOGRAM VISCERAL SELECTIVE  02/14/2020  . IR ANGIOGRAM VISCERAL SELECTIVE  03/01/2020  . IR ANGIOGRAM VISCERAL SELECTIVE  06/18/2020  . IR EMBO ARTERIAL NOT HEMORR HEMANG INC GUIDE ROADMAPPING  02/14/2020  . IR EMBO TUMOR ORGAN ISCHEMIA INFARCT INC GUIDE ROADMAPPING  04/04/2019  . IR EMBO TUMOR ORGAN ISCHEMIA INFARCT INC GUIDE ROADMAPPING  05/16/2019  .  IR EMBO TUMOR ORGAN ISCHEMIA INFARCT INC GUIDE ROADMAPPING  03/01/2020  . IR EMBO TUMOR ORGAN ISCHEMIA INFARCT INC GUIDE ROADMAPPING  06/18/2020  . IR GENERIC HISTORICAL  02/26/2016   IR RADIOLOGIST EVAL & MGMT 02/26/2016 Markus Daft, MD [facility] INTERV RAD  . IR GENERIC HISTORICAL  01/16/2016   IR RADIOLOGIST EVAL & MGMT 01/16/2016 Markus Daft, MD [facility]  INTERV RAD  . IR GENERIC HISTORICAL  06/24/2016   IR RADIOLOGIST EVAL & MGMT 06/24/2016 Aletta Edouard, MD [facility] INTERV RAD  . IR RADIOLOGIST EVAL & MGMT  12/17/2016  . IR RADIOLOGIST EVAL & MGMT  06/16/2017  . IR RADIOLOGIST EVAL & MGMT  09/08/2017  . IR RADIOLOGIST EVAL & MGMT  10/26/2017  . IR RADIOLOGIST EVAL & MGMT  05/12/2018  . IR RADIOLOGIST EVAL & MGMT  07/26/2018  . IR RADIOLOGIST EVAL & MGMT  03/22/2019  . IR RADIOLOGIST EVAL & MGMT  04/11/2019  . IR RADIOLOGIST EVAL & MGMT  06/28/2019  . IR RADIOLOGIST EVAL & MGMT  10/10/2019  . IR RADIOLOGIST EVAL & MGMT  05/21/2020  . IR RADIOLOGIST EVAL & MGMT  06/12/2020  . IR RADIOLOGIST EVAL & MGMT  10/24/2020  . IR US GUIDE VASC ACCESS LEFT  05/16/2019  . IR US GUIDE VASC ACCESS RIGHT  04/04/2019  . IR US GUIDE VASC ACCESS RIGHT  02/14/2020  . IR US GUIDE VASC ACCESS RIGHT  03/01/2020  . IR US GUIDE VASC ACCESS RIGHT  06/18/2020  . POLYPECTOMY    . RADIOFREQUENCY ABLATION N/A 07/21/2017   Procedure: CT MICROWAVE THERMAL ABLATION-LIVER;  Surgeon: Markus Daft, MD;  Location: [facility];  Service: Anesthesiology;  Laterality: N/A;  . RADIOFREQUENCY ABLATION N/A 06/29/2018   Procedure: CT MICROWAVE THERMAL ABLATION;  Surgeon: Markus Daft, MD;  Location: [facility];  Service: Anesthesiology;  Laterality: N/A;      Allergies: Patient has no known allergies.  Medications: Prior to Admission medications   Medication Sig Start Date End Date Taking? Authorizing Provider  ACCU-CHEK AVIVA PLUS test strip  11/19/19   [provider]  Accu-Chek Softclix Lancets lancets  09/12/19   [provider]  ALPRAZolam Duanne Moron) 0.5 MG tablet Take 1 tablet (0.5 mg total) by mouth as directed. Take 2 tablets (1.0 mg total) prior to MR scan.  Make take an additional 0.5 mg po if needed. Patient not taking: Reported on 07/28/2019 10/26/17   Markus Daft, MD  amLODipine (NORVASC) 10 MG tablet Take 1 tablet (10 mg total) by mouth daily. 07/24/20   Brunetta Genera, MD   aspirin EC 81 MG tablet Take 81 mg by mouth every morning.     [provider]  cholecalciferol (VITAMIN D) 1000 UNITS tablet Take 1,000 Units by mouth every morning.     [provider]  Insulin Glargine (LANTUS SOLOSTAR) 100 UNIT/ML Solostar Pen Inject 32 Units into the skin daily.    [provider]  lenvatinib 12 mg daily dose (LENVIMA) 3 x 4 MG capsule Take 12 mg by mouth daily. 09/19/20   Brunetta Genera, MD  lisinopril-hydrochlorothiazide (ZESTORETIC) 20-12.5 MG tablet TAKE 2 TABLETS BY MOUTH DAILY WITH BREAKFAST. 08/02/20   Brunetta Genera, MD  metFORMIN (GLUCOPHAGE) 1000 MG tablet Take 1,000 mg by mouth 2 (two) times daily with a meal.    [provider]  Multiple Vitamin (MULTIVITAMIN WITH MINERALS) TABS tablet Take 1 tablet daily by mouth.    [provider]  omeprazole (PRILOSEC) 20 MG capsule Take  1 capsule (20 mg total) by mouth daily. 06/18/20   Candiss Norse A, PA-C  oxyCODONE (OXY IR/ROXICODONE) 5 MG immediate release tablet Take 1 tablet (5 mg total) by mouth every 6 (six) hours as needed for severe pain. 03/01/20   Brynda Greathouse Sue-Ellen, PA  PAZEO 0.7 % SOLN Place 1 drop into both eyes every morning.  05/09/18   [provider]  simvastatin (ZOCOR) 40 MG tablet Take 20 mg by mouth every evening.     [provider]     Vital Signs: BP (!) 154/87   Pulse (!) 108   Temp 98 F (36.7 C) (Oral)   Resp 18   SpO2 100%   Physical Exam awake, alert.  Chest clear to auscultation bilaterally.  Heart with slightly tachycardic but regular rhythm.  Abdomen soft, positive bowel sounds, nontender.  Some trace pretibial edema bilaterally  Imaging: No results found.  Labs:  CBC: Recent Labs    07/24/20 1056 08/15/20 0950 09/19/20 0903 10/23/20 0949  WBC 4.3 4.9 5.0 5.4  HGB 10.9* 11.2* 12.2* 12.2*  HCT 35.5* 37.1* 40.2 40.4  PLT 285 253 266 244    COAGS: Recent Labs    02/14/20 0855  03/01/20 0825 04/19/20 0832 06/18/20 0900  INR 0.9 1.0 1.0 1.0  APTT 30  --   --  35    BMP: Recent Labs    02/16/20 0825 03/01/20 0825 04/19/20 0832 05/08/20 0948 06/18/20 0900 07/24/20 1056 08/15/20 0950 09/19/20 0903 10/23/20 0949  NA 138 140 136 139   < > 138 138 139 140  K 5.0 4.8 4.6 4.8   < > 4.8 4.8 4.8 5.3*  CL 101 102 99 102   < > 106 104 104 104  CO2 28 25 25 28    < > 22 24 25 25   GLUCOSE 159* 136* 167* 107*   < > 159* 117* 125* 103*  BUN 25* 31* 27* 35*   < > 40* 30* 37* 35*  CALCIUM 9.3 9.6 9.6 9.7   < > 10.2 9.7 9.8 10.2  CREATININE 1.63* 1.65* 1.64* 2.08*   < > 2.13* 1.87* 2.30* 2.10*  GFRNONAA 41* 41* 41* 31*   < > 32* 37* 29* 32*  GFRAA 48* 47* 47* 36*  --   --   --   --   --    < > = values in this interval not displayed.    LIVER FUNCTION TESTS: Recent Labs    07/24/20 1056 08/15/20 0950 09/19/20 0903 10/23/20 0949  BILITOT 0.4 0.5 0.5 0.6  AST 31 26 25  34  ALT 19 19 17 24   ALKPHOS 75 66 58 48  PROT 8.2* 8.2* 8.4* 8.4*  ALBUMIN 3.8 3.8 4.0 4.2    Assessment and Plan: 75 year old male with history of diabetes, fatty liver, hepatitis C, hyperlipidemia, hypertension, renal insufficiency, MGUS, and multifocal hepatocellular carcinoma.  He is familiar to IR service from multiple liver directed therapies for his multifocal hepatocellular carcinoma.  Most recently, he underwent Y 90 radioembolization to the liver on 06/18/2020.  He had significant weight loss and lack of appetite following his initial Y 90 treatment to the medial segment of the left lower lobe on 03/01/2020.  No significant symptoms following treatment of the right hepatic lobe on 06/18/2020.  He has slightly worsening chronic renal insufficiency.  His most recent AFP 2 months ago was 744.  MRI of the abdomen on 10/16/2020 revealed interval improvement of multifocal HCC in the right  hepatic lobe but interval progression in the left hepatic lobe.  Following recent discussions with Dr. Anselm Pancoast he  was deemed an appropriate candidate for additional left hepatic lobe Y 90 radioembolization and presents today for the procedure.  Risks and benefits of procedure were discussed with the patient including, but not limited to bleeding, infection, vascular injury or contrast induced renal failure.  This interventional procedure involves the use of X-rays and because of the nature of the planned procedure, it is possible that we will have prolonged use of X-ray fluoroscopy.  Potential radiation risks to you include (but are not limited to) the following: - A slightly elevated risk for cancer  several years later in life. This risk is typically less than 0.5% percent. This risk is low in comparison to the normal incidence of human cancer, which is 33% for women and 50% for men according to the [employer]. - Radiation induced injury can include skin redness, resembling a rash, tissue breakdown / ulcers and hair loss (which can be temporary or permanent).   The likelihood of either of these occurring depends on the difficulty of the procedure and whether you are sensitive to radiation due to previous procedures, disease, or genetic conditions.   IF your procedure requires a prolonged use of radiation, you will be notified and given written instructions for further action.  It is your responsibility to monitor the irradiated area for the 2 weeks following the procedure and to notify your physician if you are concerned that you have suffered a radiation induced injury.    All of the patient's questions were answered, patient is agreeable to proceed.  Consent signed and in chart.   LABS PENDING   Electronically Signed: D. Rowe Robert, PA-C 11/19/2020, 8:49 AM   I spent a total of 25 minutes at the the patient's bedside AND on the patient's hospital floor or unit, greater than 50% of which was counseling/coordinating care for visceral/hepatic arteriogram with left hepatic lobe Y 90  radioembolization

## 2020-11-19 NOTE — Progress Notes (Addendum)
Patient has had his Y 90 done. To return in 54 for BMET from hospital lab. He has appt with Dr Irene Limbo 11/20/20 and to have labs. Sent Dr Irene Limbo a message. Left message on Dr Grier Mitts nurse line [phone]). Called cancer center lab. Lab person does not know what labs are being drawn on 11/20/20. Pt to hold his metformin and his oral cancer medication until BMET results are available.   1600   Dr Grier Mitts nurse called back. CBC and CMET to be drawn at cancer center on 11/20/20. Pt does not need to come into main hospital for labs on 11/21/20. Pt verbalizes understanding.

## 2020-11-19 NOTE — Discharge Instructions (Signed)
Interventional radiology phone numbers [phone] After hours [phone]     Post Y-90 Radioembolization Discharge Instructions  You have been given a radioactive material during your procedure.  While it is safe for you to be discharged home from the hospital, you need to proceed directly home.    Do not use public transportation, including air travel, lasting more than 2 hours for 1 week.  Avoid crowded public places for 1 week.  Adult visitors should try to avoid close contact with you for 1 week.    Children and pregnant females should not visit or have close contact with you for 1 week.  Items that you touch are not radioactive.  Do not sleep in the same bed as your partner for 1 week, and a condom should be used for sexual activity during the first 24 hours.  Your blood may be radioactive and caution should be used if any bleeding occurs during the recovery period.  Body fluids may be radioactive for 24 hours.  Wash your hands after voiding.  Men should sit to urinate.  Dispose of any soiled materials (flush down toilet or place in trash at home) during the first day.  Drink 6 to 8 glasses of fluids per day for 5 days to hydrate yourself.  If you need to see a doctor during the first week, you must let them know that you were treated with yttrium-90 microspheres, and will be slightly radioactive.  They can call Interventional Radiology [phone] with any questions.    This sheet gives you information about how to care for yourself after your procedure. Your doctor may also give you more specific instructions. If you have problems or questions, contact your doctor. What can I expect after the procedure? After the procedure, it is common to have these problems at the point where the catheter was inserted:  Bruising.  Tenderness.  A collection of blood (hematoma). This may feel like a small lump under the skin at the insertion site. Follow these instructions at  home: Insertion site care  Follow instructions from your doctor about how to take care of the area where the catheter was inserted. Make sure you: ? Wash your hands with soap and water before you change your bandage (dressing). If you cannot use soap and water, use hand sanitizer.  Change your bandage as told by your doctor. Remove the dressing and apply a bandaide after your shower on 11/20/20.  Do not take baths, swim, or use a hot tub until your doctor says it is okay.  You may shower 24-48 hours after the procedure, or as told by your doctor. To clean the area: ? Gently wash the area with plain soap and water. ? Pat the area dry with a clean towel. ? Do not rub the area. This may cause bleeding.  Check your insertion area every day for signs of infection. Check for: ? Redness, swelling, or pain. ? Fluid or blood. ? Warmth. ? Pus or a bad smell.  Do not apply powder or lotion to the area. Keep the area clean and dry.   Activity  Do not drive for 24 hours if you were given a medicine to help you relax (sedative).  Rest as told by your doctor, usually for 1-2 days.  Do not lift anything that is heavier than 10 lbs. (4.5 kg) or as told by your doctor.  If your insertion site was in your leg, try to avoid stairs for a few days.  Return  to your normal activities as told by your doctor. Ask your doctor what activities are safe for you. General instructions  If the insertion area starts to bleed, lie flat and put pressure on the area. If the bleeding does not stop, get help right away. This is an emergency.  Take over-the-counter and prescription medicines only as told by your doctor.  Drink enough fluid to keep your pee (urine) pale yellow.  Keep all follow-up visits as told by your doctor. This is important.   Contact a doctor if:  You have a fever.  You have chills.  You have redness, swelling, or pain around your insertion area.  You have fluid or blood coming from  your insertion area.  The insertion area feels warm to the touch.  You have pus or a bad smell coming from your insertion area.  You have more bruising around the insertion area. Get help right away if:  You have a lot of pain in the insertion area.  The insertion area swells very fast.  The insertion area is bleeding, and the bleeding does not stop after you hold steady pressure on the area.  The area around the insertion area becomes pale, cool, tingly, or numb.  You have chest pain.  You have trouble breathing.  You have a rash.  You have any signs of a stroke. "BE FAST" is an easy way to remember the main warning signs: ? B - Balance. Signs are dizziness, sudden trouble walking, or loss of balance. ? E - Eyes. Signs are trouble seeing or a change in how you see. ? F - Face. Signs are sudden weakness or loss of feeling of the face, or the face or eyelid drooping on one side. ? A - Arms. Signs are weakness or loss of feeling in an arm. This happens suddenly and usually on one side of the body. ? S - Speech. Signs are sudden trouble speaking, slurred speech, or trouble understanding what people say. ? T - Time. Time to call emergency services. Write down what time symptoms started.  You have other signs of a stroke, such as: ? A sudden, very bad headache with no known cause. ? Feeling like you may vomit (nausea). ? Vomiting. ? A seizure. These symptoms may be an emergency. Do not wait to see if the symptoms will go away. Get medical help right away. Call your local emergency services (911 in the [place] Do not drive yourself to the hospital. Summary  After the procedure, it is common to have bruising and tenderness at the insertion area.  Do not take baths, swim, or use a hot tub until your doctor says it is okay to do so. You may shower 24-48 hours after the procedure.  It is important to rest and drink plenty of fluids.  If the insertion area starts to bleed, lie flat  and put pressure on the area. If the bleeding does not stop, get help right away. This is an emergency. This information is not intended to replace advice given to you by your health care provider. Make sure you discuss any questions you have with your health care provider. Document Revised: 06/14/2019 Document Reviewed: 06/14/2019 Elsevier Patient Education  2021 [employer].    Moderate Conscious Sedation, Adult, Care After This sheet gives you information about how to care for yourself after your procedure. Your health care provider may also give you more specific instructions. If you have problems or questions, contact your health care  provider. What can I expect after the procedure? After the procedure, it is common to have:  Sleepiness for several hours.  Impaired judgment for several hours.  Difficulty with balance.  Vomiting if you eat too soon. Follow these instructions at home: For the time period you were told by your health care provider:  Rest.  Do not participate in activities where you could fall or become injured.  Do not drive or use machinery.  Do not drink alcohol.  Do not take sleeping pills or medicines that cause drowsiness.  Do not make important decisions or sign legal documents.  Do not take care of children on your own.      Eating and drinking  Follow the diet recommended by your health care provider.  Drink enough fluid to keep your urine pale yellow.  If you vomit: ? Drink water, juice, or soup when you can drink without vomiting. ? Make sure you have little or no nausea before eating solid foods.   General instructions  Take over-the-counter and prescription medicines only as told by your health care provider.  Have a responsible adult stay with you for the time you are told. It is important to have someone help care for you until you are awake and alert.  Do not smoke.  Keep all follow-up visits as told by your health care  provider. This is important. Contact a health care provider if:  You are still sleepy or having trouble with balance after 24 hours.  You feel light-headed.  You keep feeling nauseous or you keep vomiting.  You develop a rash.  You have a fever.  You have redness or swelling around the IV site. Get help right away if:  You have trouble breathing.  You have new-onset confusion at home. Summary  After the procedure, it is common to feel sleepy, have impaired judgment, or feel nauseous if you eat too soon.  Rest after you get home. Know the things you should not do after the procedure.  Follow the diet recommended by your health care provider and drink enough fluid to keep your urine pale yellow.  Get help right away if you have trouble breathing or new-onset confusion at home. This information is not intended to replace advice given to you by your health care provider. Make sure you discuss any questions you have with your health care provider. Document Revised: 12/08/2019 Document Reviewed: 07/06/2019 Elsevier Patient Education  2021 [employer].

## 2020-11-20 ENCOUNTER — Encounter: Payer: Self-pay | Admitting: Diagnostic Radiology

## 2020-11-20 ENCOUNTER — Inpatient Hospital Stay: Payer: Medicare PPO

## 2020-11-20 ENCOUNTER — Inpatient Hospital Stay: Payer: Medicare PPO | Admitting: Hematology

## 2020-11-20 ENCOUNTER — Other Ambulatory Visit: Payer: Self-pay

## 2020-11-20 DIAGNOSIS — I10 Essential (primary) hypertension: Secondary | ICD-10-CM | POA: Diagnosis not present

## 2020-11-20 DIAGNOSIS — C220 Liver cell carcinoma: Secondary | ICD-10-CM

## 2020-11-20 DIAGNOSIS — D649 Anemia, unspecified: Secondary | ICD-10-CM | POA: Diagnosis not present

## 2020-11-20 DIAGNOSIS — D472 Monoclonal gammopathy: Secondary | ICD-10-CM | POA: Diagnosis not present

## 2020-11-20 DIAGNOSIS — Z79899 Other long term (current) drug therapy: Secondary | ICD-10-CM | POA: Diagnosis not present

## 2020-11-20 DIAGNOSIS — Z87891 Personal history of nicotine dependence: Secondary | ICD-10-CM | POA: Diagnosis not present

## 2020-11-20 DIAGNOSIS — B1920 Unspecified viral hepatitis C without hepatic coma: Secondary | ICD-10-CM | POA: Diagnosis not present

## 2020-11-20 LAB — CMP (CANCER CENTER ONLY)
ALT: 22 U/L (ref 0–44)
AST: 34 U/L (ref 15–41)
Albumin: 3.7 g/dL (ref 3.5–5.0)
Alkaline Phosphatase: 60 U/L (ref 38–126)
Anion gap: 11 (ref 5–15)
BUN: 32 mg/dL — ABNORMAL HIGH (ref 8–23)
CO2: 24 mmol/L (ref 22–32)
Calcium: 8.8 mg/dL — ABNORMAL LOW (ref 8.9–10.3)
Chloride: 102 mmol/L (ref 98–111)
Creatinine: 2.04 mg/dL — ABNORMAL HIGH (ref 0.61–1.24)
GFR, Estimated: 34 mL/min — ABNORMAL LOW (ref >60)
Glucose, Bld: 148 mg/dL — ABNORMAL HIGH (ref 70–99)
Potassium: 4.5 mmol/L (ref 3.5–5.1)
Sodium: 137 mmol/L (ref 135–145)
Total Bilirubin: 0.4 mg/dL (ref 0.3–1.2)
Total Protein: 8 g/dL (ref 6.5–8.1)

## 2020-11-20 LAB — CBC WITH DIFFERENTIAL (CANCER CENTER ONLY)
Abs Immature Granulocytes: 0.05 10*3/uL (ref 0.00–0.07)
Basophils Absolute: 0 10*3/uL (ref 0.0–0.1)
Basophils Relative: 0 %
Eosinophils Absolute: 0 10*3/uL (ref 0.0–0.5)
Eosinophils Relative: 0 %
HCT: 35.8 % — ABNORMAL LOW (ref 39.0–52.0)
Hemoglobin: 10.8 g/dL — ABNORMAL LOW (ref 13.0–17.0)
Immature Granulocytes: 1 %
Lymphocytes Relative: 12 %
Lymphs Abs: 1.2 10*3/uL (ref 0.7–4.0)
MCH: 22.1 pg — ABNORMAL LOW (ref 26.0–34.0)
MCHC: 30.2 g/dL (ref 30.0–36.0)
MCV: 73.4 fL — ABNORMAL LOW (ref 80.0–100.0)
Monocytes Absolute: 0.8 10*3/uL (ref 0.1–1.0)
Monocytes Relative: 8 %
Neutro Abs: 7.4 10*3/uL (ref 1.7–7.7)
Neutrophils Relative %: 79 %
Platelet Count: 264 10*3/uL (ref 150–400)
RBC: 4.88 MIL/uL (ref 4.22–5.81)
RDW: 15.4 % (ref 11.5–15.5)
WBC Count: 9.4 10*3/uL (ref 4.0–10.5)
nRBC: 0 % (ref 0.0–0.2)

## 2020-11-20 LAB — GLUCOSE, CAPILLARY: Glucose-Capillary: 223 mg/dL — ABNORMAL HIGH (ref 70–99)

## 2020-11-20 LAB — MAGNESIUM: Magnesium: 1.9 mg/dL (ref 1.7–2.4)

## 2020-11-20 MED ORDER — AMLODIPINE BESYLATE 10 MG PO TABS: 10.0000 mg | ORAL_TABLET | Freq: Every day | ORAL | 5 refills | Status: DC

## 2020-11-20 NOTE — Progress Notes (Signed)
Patient ID: [name], male   DOB: [birth year], 75 y.o.   MRN: [id number]   Patient's creatinine is 2.04 today and 2.07 yesterday prior to angiogram.  I left a message with the patient that he can resume his Metformin.  We will try to directly contact the patient again tomorrow.

## 2020-11-20 NOTE — Progress Notes (Signed)
HEMATOLOGY/ONCOLOGY CLINIC NOTE  Date of Service: 11/20/2020  Patient Care Team: Jani Gravel, MD as PCP - General (Internal Medicine)  CHIEF COMPLAINTS/PURPOSE OF CONSULTATION:  F/u for [facility] on Lenvatinib  HISTORY OF PRESENTING ILLNESS:   Aaron [name] is a wonderful 75 y.o. male who has been referred to [place] by Dr Jani Gravel for evaluation and management of monoclonal gammopathy of undetermined significance.  Patient has history of hypertension, diabetes, dyslipidemia, hepatocellular carcinoma (rx with TACE and percutaneous thermal ablation), hepatitis C status post treatment, iron deficiency anemia in 2014 treated with oral iron.  Patient had an SPEP done by his primary care physician on 10/04/2015 that showed an M spike of 0.3 g/dL. It was presumably will be done due to his complaints of fatigue. He has had no significant anemia. No new bone pains. No fevers/chills/drenching night sweats.  No new anemia..  Outside labs show hemoglobin of 12.4 with microcytosis with an MCV of 70.5, normal WBC count of 5.1k.  No evidence of hypercalcemia or significant renal failure on his outside labs.   INTERVAL HISTORY:  Aaron [name] is here for his scheduled follow-up for [facility]. The patient's last visit with [place] was on 10/23/2020. The pt reports that he is doing well overall. We are joined today by his wife.  The pt reports no issues tolerating the Y-90 treatment. He has been eating and drinking well. The pt notes that he lost much weight after the first Y90 treatment due to inability to eat and decreased appetite, but this has improved since and he is gaining some of that weight back. The pt notes that his blood sugars have been fairly stable. The pt notes they were 145 yesterday and are normally from 95-110. The pt notes he has not been taking the Norvasc due to running out, but has been taking the HCTZ. The pt notes that his insurance has been calling him multiple times and sending messages about  the [place] and needing it to come from their special pharmacy.  Lab results today 11/20/2020 of CBC w/diff and CMP is as follows: all values are WNL except for Hgb of 10.8, HCT of 35.8, MCV of 73.4, MCH of 22.1. CMP pending. 11/20/2020 Magnesium pending.  On review of systems, pt denies abdominal pain, nausea, vomiting, diarrhea, decreased appetite, sudden weight loss, and any other symptoms.  MEDICAL HISTORY:  Past Medical History:  Diagnosis Date  . Arthritis   . Diabetes mellitus without complication ([place])    type II   . Elevated liver enzymes   . Fatty liver   . Hepatitis C    genotype 1A status post treatment with Viekira and Ribavarin for 24 weeks  . Hepatocellular carcinoma ([place]) 01/30/2014   Path  . History of colon polyps   . Hyperlipidemia   . Hypertension   . Iron deficiency anemia    hx of   . MGUS (monoclonal gammopathy of unknown significance)    Obesity Hepatocellular carcinoma treated with TACE and percutaneous thermal ablation by interventional radiology. Last MRI on 08/06/2015 showed slight decrease in the size of the ablation defect involving the right lobe of the liver area and no findings to suggest residual or recurrent hepatocellular carcinoma. Mild changes of liver cirrhosis. MRI Abd 06/24/2016- no evidence of recurrent HCC   Hepatitis C genotype 1A status post treatment with Viekira and Ribavarin for 24 weeks.  Monoclonal gammopathy of undetermined significance.  SURGICAL HISTORY:  Status post microwave ablation[October 2015] of liver lesion and  TACE [July 2015] for [place]. EGD and colonoscopy in 2016   SOCIAL HISTORY: Social History   Socioeconomic History  . Marital status: Married    Spouse name: Not on file  . Number of children: Not on file  . Years of education: Not on file  . Highest education level: Not on file  Occupational History  . Not on file  Tobacco Use  . Smoking status: Former Smoker    Quit date: 01/30/1994    Years since  quitting: 26.8  . Smokeless tobacco: Never Used  . Tobacco comment: stopped 25 yrs ago  Vaping Use  . Vaping Use: Never used  Substance and Sexual Activity  . Alcohol use: No    Comment: stopped 30 years ago   . Drug use: No  . Sexual activity: Not Currently  Other Topics Concern  . Not on file  Social History Narrative  . Not on file   Social Determinants of Health   Financial Resource Strain: Not on file  Food Insecurity: Not on file  Transportation Needs: Not on file  Physical Activity: Not on file  Stress: Not on file  Social Connections: Not on file  Intimate Partner Violence: Not on file  Former smoker and smoked 1 pack per day for about 20 years starting at age 60 quit 96 years ago.  FAMILY HISTORY: No family history on file.  ALLERGIES:  has No Known Allergies.  MEDICATIONS:  Current Outpatient Medications  Medication Sig Dispense Refill  . ACCU-CHEK AVIVA PLUS test strip     . Accu-Chek Softclix Lancets lancets     . ALPRAZolam (XANAX) 0.5 MG tablet Take 1 tablet (0.5 mg total) by mouth as directed. Take 2 tablets (1.0 mg total) prior to MR scan.  Make take an additional 0.5 mg po if needed. (Patient not taking: No sig reported) 3 tablet 0  . amLODipine (NORVASC) 10 MG tablet Take 1 tablet (10 mg total) by mouth daily. 30 tablet 5  . aspirin EC 81 MG tablet Take 81 mg by mouth every morning.     . cholecalciferol (VITAMIN D) 1000 UNITS tablet Take 1,000 Units by mouth every morning.     . Insulin Glargine (LANTUS SOLOSTAR) 100 UNIT/ML Solostar Pen Inject 32 Units into the skin daily.    [name] Kitchen lenvatinib 12 mg daily dose (LENVIMA) 3 x 4 MG capsule Take 12 mg by mouth daily. 90 capsule 2  . lisinopril-hydrochlorothiazide (ZESTORETIC) 20-12.5 MG tablet TAKE 2 TABLETS BY MOUTH DAILY WITH BREAKFAST. 60 tablet 0  . metFORMIN (GLUCOPHAGE) 1000 MG tablet Take 1,000 mg by mouth 2 (two) times daily with a meal.    . Multiple Vitamin (MULTIVITAMIN WITH MINERALS) TABS tablet Take  1 tablet daily by mouth.    [name] Kitchen omeprazole (PRILOSEC) 20 MG capsule Take 1 capsule (20 mg total) by mouth daily. 30 capsule 0  . oxyCODONE (OXY IR/ROXICODONE) 5 MG immediate release tablet Take 1 tablet (5 mg total) by mouth every 6 (six) hours as needed for severe pain. 15 tablet 0  . PAZEO 0.7 % SOLN Place 1 drop into both eyes every morning.   3  . simvastatin (ZOCOR) 40 MG tablet Take 20 mg by mouth every evening.      Current Facility-Administered Medications  Medication Dose Route Frequency Provider Last Rate Last Admin  . oxyCODONE (Oxy IR/ROXICODONE) immediate release tablet 5 mg  5 mg Oral Q6H PRN Jacqualine Mau, NP        REVIEW OF  SYSTEMS:   10 Point review of Systems was done is negative except as noted above.   PHYSICAL EXAMINATION: ECOG FS:1 - Symptomatic but completely ambulatory  Vitals:   11/20/20 1428  BP: (!) 171/83  Pulse: 75  Resp: 20  Temp: 97.9 F (36.6 C)  SpO2: 100%   Wt Readings from Last 3 Encounters:  11/20/20 246 lb 6.4 oz (111.8 kg)  10/23/20 243 lb (110.2 kg)  09/19/20 241 lb 1.6 oz (109.4 kg)   Body mass index is 33.42 kg/m.    GENERAL:alert, in no acute distress and comfortable SKIN: no acute rashes, no significant lesions EYES: conjunctiva are pink and non-injected, sclera anicteric OROPHARYNX: MMM, no exudates, no oropharyngeal erythema or ulceration NECK: supple, no JVD LYMPH:  no palpable lymphadenopathy in the cervical, axillary or inguinal regions LUNGS: clear to auscultation b/l with normal respiratory effort HEART: regular rate & rhythm ABDOMEN:  normoactive bowel sounds , non tender, not distended. Extremity: no pedal edema PSYCH: alert & oriented x 3 with fluent speech NEURO: no focal motor/sensory deficits  LABORATORY DATA:  I have reviewed the data as listed  CBC Latest Ref Rng & Units 11/20/2020 11/19/2020 10/23/2020  WBC 4.0 - 10.5 K/uL 9.4 6.1 5.4  Hemoglobin 13.0 - 17.0 g/dL 10.8(L) 11.9(L) 12.2(L)  Hematocrit 39.0  - 52.0 % 35.8(L) 38.7(L) 40.4  Platelets 150 - 400 K/uL 264 290 244    CBC    Component Value Date/Time   WBC 9.4 11/20/2020 1412   WBC 6.1 11/19/2020 0835   RBC 4.88 11/20/2020 1412   HGB 10.8 (L) 11/20/2020 1412   HGB 11.5 (L) 03/22/2017 1402   HCT 35.8 (L) 11/20/2020 1412   HCT 37.0 (L) 03/22/2017 1402   PLT 264 11/20/2020 1412   PLT 262 03/22/2017 1402   MCV 73.4 (L) 11/20/2020 1412   MCV 72.3 (L) 03/22/2017 1402   MCH 22.1 (L) 11/20/2020 1412   MCHC 30.2 11/20/2020 1412   RDW 15.4 11/20/2020 1412   RDW 15.6 (H) 03/22/2017 1402   LYMPHSABS 1.2 11/20/2020 1412   LYMPHSABS 1.8 03/22/2017 1402   MONOABS 0.8 11/20/2020 1412   MONOABS 0.4 03/22/2017 1402   EOSABS 0.0 11/20/2020 1412   EOSABS 0.1 03/22/2017 1402   BASOSABS 0.0 11/20/2020 1412   BASOSABS 0.0 03/22/2017 1402    . CMP Latest Ref Rng & Units 11/19/2020 10/23/2020 09/19/2020  Glucose 70 - 99 mg/dL 120(H) 103(H) 125(H)  BUN 8 - 23 mg/dL 31(H) 35(H) 37(H)  Creatinine 0.61 - 1.24 mg/dL 2.07(H) 2.10(H) 2.30(H)  Sodium 135 - 145 mmol/L 137 140 139  Potassium 3.5 - 5.1 mmol/L 4.0 5.3(H) 4.8  Chloride 98 - 111 mmol/L 102 104 104  CO2 22 - 32 mmol/L _0 Calcium 8.9 - 10.3 mg/dL 9.4 10.2 9.8  Total Protein 6.5 - 8.1 g/dL 8.7(H) 8.4(H) 8.4(H)  Total Bilirubin 0.3 - 1.2 mg/dL 0.9 0.6 0.5  Alkaline Phos 38 - 126 U/L 59 48 58  AST 15 - 41 U/L 32 34 25  ALT 0 - 44 U/L _1 06/21/2019 MR ABDOMEN WWO CONTRAST (Accession [id number])     IFE 1  Comment   Comments: Immunofixation shows IgG monoclonal protein with kappa light chain  specificity.           RADIOGRAPHIC STUDIES:  MRI abd w and wo contrast: 12/17/2016: IMPRESSION: 1. Postprocedural changes of thermal ablation again noted in the right lobe of the liver, without definitive evidence  to suggest local recurrence of disease. No new hepatic lesions are noted. 2. Additional incidental findings, as above.   Electronically Signed   By:  Vinnie Langton M.D.   On: 12/17/2016 09:42    MRI abd w and wo contrast 01/26/2020 IMPRESSION: 1. Interval enlargement of multiple hepatic masses as detailed above, consistent with progression of multifocal hepatocellular carcinoma. There is evidence of prior ablation in hepatic segments VII and VIII. 2. No evidence of metastatic disease in the abdomen.  Electronically Signed   By: Eddie Candle M.D.   On: 01/27/2020 15:51   ASSESSMENT & PLAN:   75 y.o. male with  #1 Monoclonal gammopathy of undetermined significance.  M protein 0.2 mg/dL immunofixation showing IgG monoclonal protein with kappa light chain specificity. SPEP 02/2016 - 0.3g/dl  No overt anemia.  No overt hypercalcemia. Stable CKD creatinine 1.6  #2 bone lucencies in the right radial mid midshaft and left humeral head. PET/CT did not show any hypermetabolic bone lesions. MRI left shoulder showed no evidence of metastatic disease or multiple myeloma. The x-ray findings appear to be areas of mild demineralization. Patient was seen by orthopedics and no additional recommendations were given.  Plan -Patient's anemia is stable. No significant change in renal function. No new bone pains. No hypercalcemia . No overall no overt evidence of progression of multiple myeloma. -Myeloma labs from today show stable M protein @ 0.2g/dl with no evidence of progression- consistent with MGUS.  #3 Hepatocellular carcinoma treated with TACE and percutaneous thermal ablation by interventional radiology.   Last MRI on 08/06/2015 showed slight decrease in the size of the ablation defect involving the right lobe of the liver area and no findings to suggest residual or recurrent hepatocellular carcinoma. Mild changes of liver cirrhosis.  01/16/2016 MRI Abdomen showed resolution of previously ablated lesion but a new 1.3 cm lesion was noted which was subsequently ablated under CT guidance by interventional radiology on 01/31/2016. Elevated  transaminases on 02/01/2016 were likely related to his ablation. These have since resolved.   06/24/2016 MRI Abdomen showed no evidence of recurrent hepatocellular carcinoma . 12/17/2016 MRI Abdomen shows no evidence of recurrent hepatocellular carcinoma .  10/26/17 MRI Abdomen revealed Motion degraded images. Status post thermal ablation of the segment 8 lesion, without enhancement to suggest residual viable tumor. Prior ablation of a segment 7 lesion, grossly unchanged. No convincing central enhancement  03/14/2019  MRI abdomen w and wo contrast revealed "1. New areas of restricted diffusion, surrounding nodular arterial phase enhancement and delayed washout surrounding the ablation zone defects anteriorly in segments 8 and 7 consistent with local recurrence of hepatocellular carcinoma. 2. The peripheral ablation zone defect laterally in segment 8 is unchanged. 3. No extrahepatic tumor identified."  10/02/2019 MRI Abdomen ([id number]) revealed "1. Progressive multifocal hepatocellular carcinoma, especially anteriorly around the ablated lesion in segments 4B and 8. There are additional new lesions in segments 2 and 3. No evidence of extrahepatic metastatic disease."  01/26/2020 MRI Abdomen ([id number]) revealed "1. Interval enlargement of multiple hepatic masses as detailed above, consistent with progression of multifocal hepatocellular carcinoma. There is evidence of prior ablation in hepatic segments VII and VIII. 2. No evidence of metastatic disease in the abdomen."  #4 Hepatitis C genotype 1A status post treatment with Viekira and Ribavarin for 24 weeks.   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    PLAN:  -Discussed pt labwork today, 11/20/2020; blood counts stable, other labs pending. -Advised  pt to f/u w PCP regarding stopping Metformin now that creatinine is improved due to accumulation of lactic acid.  -Will restart  Lenvima 3week s following completion of Y90. -Recommended pt use MyChart for sending messages and communicating refill needs. -Hold Sanvostatin for one month.  -Recommend pt try to increase water intake to 48-64 oz daily. -Stop Metformin.  -Advised pt he can gradually increase Lantus if sugars consisently above 180. -Advised pt to contact Leron Croak if insurance keeps calling regarding medication. Will provide phone number. -Will see back in 3 weeks via phone with labs 1-2 days prior.   FOLLOW UP: Phone visit with Dr Irene Limbo in 3 weeks. Labs 1-2 days prior to phone visit   The total time spent in the appointment was 30 minutes and more than 50% was on counseling and direct patient cares.    All of the patient's questions were answered with apparent satisfaction. The patient knows to call the clinic with any problems, questions or concerns.   [name] MD [place] AAHIVMS [facility] [place] Hematology/Oncology Physician [facility]  (Office):       [phone] (Work cell):  [phone] (Fax):           [phone]   I, Reinaldo Raddle, am acting as scribe for Dr. [name], MD.     .I have reviewed the above documentation for accuracy and completeness, and I agree with the above. Brunetta Genera MD

## 2020-11-21 ENCOUNTER — Telehealth: Payer: Self-pay | Admitting: Student

## 2020-11-21 ENCOUNTER — Telehealth: Payer: Self-pay | Admitting: Hematology

## 2020-11-21 NOTE — Telephone Encounter (Signed)
Scheduled follow-up appointments per 3/30 los. Patient is aware. 

## 2020-11-21 NOTE — Telephone Encounter (Signed)
IR.  Patient underwent an image-guided Y90 radioembolization of left hepatic lobe via right femoral approach 11/19/2020 by Dr. Anselm Pancoast. Metformin was held post-procedure. Repeat CMP reveals Cr stable- ok to restart Metformin beginning today 11/21/2020.  Called patient at 1011 to discuss above. He states he will restart Metformin today. All questions answered and concerns addressed.  Please call IR with questions/concerns.   Bea Graff Ferron Ishmael, PA-C 11/21/2020, 10:13 AM

## 2020-11-26 ENCOUNTER — Other Ambulatory Visit: Payer: Self-pay | Admitting: Diagnostic Radiology

## 2020-11-26 DIAGNOSIS — C220 Liver cell carcinoma: Secondary | ICD-10-CM

## 2020-12-06 ENCOUNTER — Other Ambulatory Visit: Payer: Self-pay | Admitting: Hematology

## 2020-12-09 ENCOUNTER — Other Ambulatory Visit (HOSPITAL_COMMUNITY): Payer: Self-pay

## 2020-12-11 ENCOUNTER — Other Ambulatory Visit: Payer: Self-pay

## 2020-12-11 ENCOUNTER — Ambulatory Visit
Admission: RE | Admit: 2020-12-11 | Discharge: 2020-12-11 | Disposition: A | Discharge status: Home / Self Care | Payer: Medicare PPO | Source: Ambulatory Visit | Attending: Diagnostic Radiology | Admitting: Diagnostic Radiology

## 2020-12-11 DIAGNOSIS — C220 Liver cell carcinoma: Secondary | ICD-10-CM | POA: Diagnosis not present

## 2020-12-11 HISTORY — PX: IR RADIOLOGIST EVAL & MGMT: IMG5224

## 2020-12-11 NOTE — Progress Notes (Signed)
Chief Complaint: Patient was consulted remotely today ([place]) for Y 90 radioembolization follow-up  Referring Physician(s): [name]  History of Present Illness: Aaron [name] is a 75 y.o. male with multifocal hepatocellular carcinoma who was undergone both systemic and liver directed therapies.  The patient recently underwent transcatheter radioembolization to the lateral left hepatic lobe on 11/19/2020.  This was the patient's third Y 90 radioembolization treatment.  Patient had no significant symptoms or complaints following the procedure.  He denies abdominal pain or changes in appetite.  He had labs the day after the radioembolization procedure and was seen by Dr.Kale in oncology.  No significant change in the patient's liver enzymes following the radioembolization procedure.  In addition, patient has renal insufficiency and the creatinine did not significantly change after the administration of intravenous contrast. Metformin has been stopped.  Patient remains active and he has been doing outside activities.  Past Medical History:  Diagnosis Date  . Arthritis   . Diabetes mellitus without complication ([place])    type II   . Elevated liver enzymes   . Fatty liver   . Hepatitis C    genotype 1A status post treatment with Viekira and Ribavarin for 24 weeks  . Hepatocellular carcinoma ([place]) 01/30/2014   Path  . History of colon polyps   . Hyperlipidemia   . Hypertension   . Iron deficiency anemia    hx of   . MGUS (monoclonal gammopathy of unknown significance)     Past Surgical History:  Procedure Laterality Date  . COLONOSCOPY    . ESOPHAGOGASTRODUODENOSCOPY  2016  . IR ANGIOGRAM SELECTIVE EACH ADDITIONAL VESSEL  04/04/2019  . IR ANGIOGRAM SELECTIVE EACH ADDITIONAL VESSEL  04/04/2019  . IR ANGIOGRAM SELECTIVE EACH ADDITIONAL VESSEL  04/04/2019  . IR ANGIOGRAM SELECTIVE EACH ADDITIONAL VESSEL  04/04/2019  . IR ANGIOGRAM SELECTIVE EACH ADDITIONAL VESSEL  04/04/2019  .  IR ANGIOGRAM SELECTIVE EACH ADDITIONAL VESSEL  04/04/2019  . IR ANGIOGRAM SELECTIVE EACH ADDITIONAL VESSEL  04/04/2019  . IR ANGIOGRAM SELECTIVE EACH ADDITIONAL VESSEL  05/16/2019  . IR ANGIOGRAM SELECTIVE EACH ADDITIONAL VESSEL  05/16/2019  . IR ANGIOGRAM SELECTIVE EACH ADDITIONAL VESSEL  05/16/2019  . IR ANGIOGRAM SELECTIVE EACH ADDITIONAL VESSEL  02/14/2020  . IR ANGIOGRAM SELECTIVE EACH ADDITIONAL VESSEL  02/14/2020  . IR ANGIOGRAM SELECTIVE EACH ADDITIONAL VESSEL  02/14/2020  . IR ANGIOGRAM SELECTIVE EACH ADDITIONAL VESSEL  02/14/2020  . IR ANGIOGRAM SELECTIVE EACH ADDITIONAL VESSEL  03/01/2020  . IR ANGIOGRAM SELECTIVE EACH ADDITIONAL VESSEL  03/01/2020  . IR ANGIOGRAM SELECTIVE EACH ADDITIONAL VESSEL  03/01/2020  . IR ANGIOGRAM SELECTIVE EACH ADDITIONAL VESSEL  03/01/2020  . IR ANGIOGRAM SELECTIVE EACH ADDITIONAL VESSEL  03/01/2020  . IR ANGIOGRAM SELECTIVE EACH ADDITIONAL VESSEL  06/18/2020  . IR ANGIOGRAM SELECTIVE EACH ADDITIONAL VESSEL  06/18/2020  . IR ANGIOGRAM SELECTIVE EACH ADDITIONAL VESSEL  11/19/2020  . IR ANGIOGRAM SELECTIVE EACH ADDITIONAL VESSEL  11/19/2020  . IR ANGIOGRAM VISCERAL SELECTIVE  04/04/2019  . IR ANGIOGRAM VISCERAL SELECTIVE  05/16/2019  . IR ANGIOGRAM VISCERAL SELECTIVE  02/14/2020  . IR ANGIOGRAM VISCERAL SELECTIVE  02/14/2020  . IR ANGIOGRAM VISCERAL SELECTIVE  03/01/2020  . IR ANGIOGRAM VISCERAL SELECTIVE  06/18/2020  . IR ANGIOGRAM VISCERAL SELECTIVE  11/19/2020  . IR EMBO ARTERIAL NOT HEMORR HEMANG INC GUIDE ROADMAPPING  02/14/2020  . IR EMBO TUMOR ORGAN ISCHEMIA INFARCT INC GUIDE ROADMAPPING  04/04/2019  . IR EMBO TUMOR ORGAN ISCHEMIA INFARCT INC GUIDE ROADMAPPING  05/16/2019  .  IR EMBO TUMOR ORGAN ISCHEMIA INFARCT INC GUIDE ROADMAPPING  03/01/2020  . IR EMBO TUMOR ORGAN ISCHEMIA INFARCT INC GUIDE ROADMAPPING  06/18/2020  . IR EMBO TUMOR ORGAN ISCHEMIA INFARCT INC GUIDE ROADMAPPING  11/19/2020  . IR GENERIC HISTORICAL  02/26/2016   IR RADIOLOGIST EVAL & MGMT 02/26/2016 Markus Daft,  MD [facility] RAD  . IR GENERIC HISTORICAL  01/16/2016   IR RADIOLOGIST EVAL & MGMT 01/16/2016 Markus Daft, MD [facility]  . IR GENERIC HISTORICAL  06/24/2016   IR RADIOLOGIST EVAL & MGMT 06/24/2016 Aletta Edouard, MD [facility]  . IR RADIOLOGIST EVAL & MGMT  12/17/2016  . IR RADIOLOGIST EVAL & MGMT  06/16/2017  . IR RADIOLOGIST EVAL & MGMT  09/08/2017  . IR RADIOLOGIST EVAL & MGMT  10/26/2017  . IR RADIOLOGIST EVAL & MGMT  05/12/2018  . IR RADIOLOGIST EVAL & MGMT  07/26/2018  . IR RADIOLOGIST EVAL & MGMT  03/22/2019  . IR RADIOLOGIST EVAL & MGMT  04/11/2019  . IR RADIOLOGIST EVAL & MGMT  06/28/2019  . IR RADIOLOGIST EVAL & MGMT  10/10/2019  . IR RADIOLOGIST EVAL & MGMT  05/21/2020  . IR RADIOLOGIST EVAL & MGMT  06/12/2020  . IR RADIOLOGIST EVAL & MGMT  10/24/2020  . IR US GUIDE VASC ACCESS LEFT  05/16/2019  . IR US GUIDE VASC ACCESS RIGHT  04/04/2019  . IR US GUIDE VASC ACCESS RIGHT  02/14/2020  . IR US GUIDE VASC ACCESS RIGHT  03/01/2020  . IR US GUIDE VASC ACCESS RIGHT  06/18/2020  . IR US GUIDE VASC ACCESS RIGHT  11/19/2020  . POLYPECTOMY    . RADIOFREQUENCY ABLATION N/A 07/21/2017   Procedure: CT MICROWAVE THERMAL ABLATION-LIVER;  Surgeon: Markus Daft, MD;  Location: [facility];  Service: Anesthesiology;  Laterality: N/A;  . RADIOFREQUENCY ABLATION N/A 06/29/2018   Procedure: CT MICROWAVE THERMAL ABLATION;  Surgeon: Markus Daft, MD;  Location: [facility];  Service: Anesthesiology;  Laterality: N/A;    Allergies: Patient has no known allergies.  Medications: Prior to Admission medications   Medication Sig Start Date End Date Taking? Authorizing Provider  ACCU-CHEK AVIVA PLUS test strip  11/19/19   [provider]  Accu-Chek Softclix Lancets lancets  09/12/19   [provider]  ALPRAZolam Duanne Moron) 0.5 MG tablet Take 1 tablet (0.5 mg total) by mouth as directed. Take 2 tablets (1.0 mg total) prior to MR scan.  Make take an additional 0.5 mg po if needed. Patient not taking: No  sig reported 10/26/17   Markus Daft, MD  amLODipine (NORVASC) 10 MG tablet Take 1 tablet (10 mg total) by mouth daily. 11/20/20   Brunetta Genera, MD  aspirin EC 81 MG tablet Take 81 mg by mouth every morning.     [provider]  cholecalciferol (VITAMIN D) 1000 UNITS tablet Take 1,000 Units by mouth every morning.     [provider]  Insulin Glargine (LANTUS SOLOSTAR) 100 UNIT/ML Solostar Pen Inject 32 Units into the skin daily.    [provider]  lenvatinib 12 mg daily dose (LENVIMA) 3 x 4 MG capsule TAKE 3 CAPSULES (12MG ) BY MOUTH DAILY. 09/19/20 09/19/21  Brunetta Genera, MD  lisinopril-hydrochlorothiazide (ZESTORETIC) 20-12.5 MG tablet TAKE 2 TABLETS BY MOUTH DAILY WITH BREAKFAST. 12/09/20   Brunetta Genera, MD  metFORMIN (GLUCOPHAGE) 1000 MG tablet Take 1,000 mg by mouth 2 (two) times daily with a meal.    [provider]  Multiple Vitamin (MULTIVITAMIN WITH  MINERALS) TABS tablet Take 1 tablet daily by mouth.    [provider]  omeprazole (PRILOSEC) 20 MG capsule Take 1 capsule (20 mg total) by mouth daily. Patient not taking: Reported on 11/20/2020 06/18/20   Candiss Norse A, PA-C  oxyCODONE (OXY IR/ROXICODONE) 5 MG immediate release tablet Take 1 tablet (5 mg total) by mouth every 6 (six) hours as needed for severe pain. 03/01/20   Brynda Greathouse Sue-Ellen, PA  PAZEO 0.7 % SOLN Place 1 drop into both eyes every morning.  05/09/18   [provider]     No family history on file.  Social History   Socioeconomic History  . Marital status: Married    Spouse name: Not on file  . Number of children: Not on file  . Years of education: Not on file  . Highest education level: Not on file  Occupational History  . Not on file  Tobacco Use  . Smoking status: Former Smoker    Quit date: 01/30/1994    Years since quitting: 26.8  . Smokeless tobacco: Never Used  . Tobacco comment: stopped 25 yrs ago  Vaping Use  . Vaping  Use: Never used  Substance and Sexual Activity  . Alcohol use: No    Comment: stopped 30 years ago   . Drug use: No  . Sexual activity: Not Currently  Other Topics Concern  . Not on file  Social History Narrative  . Not on file   Social Determinants of Health   Financial Resource Strain: Not on file  Food Insecurity: Not on file  Transportation Needs: Not on file  Physical Activity: Not on file  Stress: Not on file  Social Connections: Not on file     Review of Systems  Constitutional: Negative.  Negative for appetite change, chills and fever.  Gastrointestinal: Negative.      Physical Exam No direct physical exam was performed  Vital Signs: There were no vitals taken for this visit.  Imaging: NM LIVER TUMOR LOC IMFLAM SPECT 1 DAY  Result Date: 11/19/2020 CLINICAL DATA:  Multifocal hepatocellular carcinoma. Second treatment to the LEFT hepatic lobe. Today's treatment treatments to segments 2 and 3. Prior radioembolization to the central LEFT hepatic lobe with 30 millicuries on 16/05/9603. Prior RIGHT hepatic lobe treatment with 30 millicuries on 54/04/8118 EXAM: NUCLEAR MEDICINE SPECIAL MED RAD PHYSICS CONS; NUCLEAR MEDICINE RADIO PHARM THERAPY INTRA ARTERIAL; NUCLEAR MEDICINE TREATMENT PROCEDURE; NUCLEAR MEDICINE LIVER SCAN TECHNIQUE: In conjunction with the interventional radiologist a Y- Microsphere dose was calculated utilizing body surface area formulation. Calculated dose equal 19.5 mCi. Pre therapy MAA liver SPECT scan and CTA were evaluated. Utilizing a microcatheter system, the hepatic artery was selected and Y-90 microspheres were delivered in fractionated aliquots. Radiopharmaceutical was delivered by the interventional radiologist and nuclear radiologist. The patient tolerated procedure well. No adverse effects were noted. Bremsstrahlung planar and SPECT imaging of the abdomen following intrahepatic arterial delivery of Y-90 microsphere was performed.  RADIOPHARMACEUTICALS:  14.7 millicuries yttrium 90 microspheres. COMPARISON:  [facility]  05/16/2020, MRI 10/16/2020 FINDINGS: Y - 90 microspheres therapy as above. Second therapy the LEFT hepatic lobe. See clinical data Bremsstrahlung planar and SPECT imaging of the abdomen following intrahepatic arterial delivery of Y-33microsphere demonstrates radioactivity localized to the LEFT hepatic lobe. No evidence of extrahepatic activity. IMPRESSION: Successful Y - 90 microsphere delivery for treatment of unresectable liver metastasis. Second therapy to the LEFT lobe. See clinical data. Bremssstrahlung scan demonstrates activity localized to LEFT hepatic lobe with no extrahepatic  activity identified. Electronically Signed   By: Suzy Bouchard M.D.   On: 11/19/2020 13:26   IR Angiogram Visceral Selective  Result Date: 11/19/2020 INDICATION: 76 year old with multifocal hepatocellular carcinoma. Patient presents for treatment of the left hepatic lobe. EXAM: 1. Transcatheter radioembolization of left hepatic artery branch 2. Visceral angiography including common hepatic artery and left hepatic artery branches 3. Ultrasound guidance for vascular access Physician: Stephan Minister. Anselm Pancoast, MD MEDICATIONS: Cefoxitin 2 g. The antibiotic was administered within 1 hour of the procedure Additional Medications: 8 mg IV Decadron, 40 mg IV Protonix, 8 mg IV Zofran Y-90 dose: 18.4 mCi ANESTHESIA/SEDATION: Fentanyl 100 mcg IV; Versed 4 mg IV Moderate Sedation Time:  83 minutes The patient was continuously monitored during the procedure by the interventional radiology nurse under my direct supervision. CONTRAST:  38-ml Omnipaque-300/Visipaque 370 FLUOROSCOPY TIME:  Fluoroscopy Time: 12 minutes, 54 seconds, 7096 mGy COMPLICATIONS: None immediate. PROCEDURE: Informed consent was obtained from the patient following explanation of the procedure, risks, benefits and alternatives. The patient understands, agrees and consents for the procedure. All questions  were addressed. A time out was performed prior to the initiation of the procedure. Right groin was prepped and draped in sterile fashion. Maximal barrier sterile technique was utilized including caps, mask, sterile gowns, sterile gloves, sterile drape, hand hygiene and skin antiseptic. Ultrasound confirmed a patent right common femoral artery. Ultrasound image was saved for documentation. Skin was anesthetized using 1% lidocaine. Small incision was made. Using ultrasound guidance, 21 gauge needle was directed into the right common femoral artery and micropuncture dilator set was placed. Bentson wire was advanced into the abdominal aorta. A 5-French sheath was placed. A 5-French Cobra catheter was advanced and used to selectively catheterize the celiac axis. Catheter was advanced into the common hepatic artery. Arteriography was performed in the common hepatic artery using diluted contrast. A high-flow Renegade microcatheter was then advanced into the 2 main left hepatic arteries. Selective angiography was performed in each left hepatic artery. The lateral branch supplying segments 2 and 3 was selected for treatment. Catheter was securely positioned within the left hepatic artery branch for the radioembolization procedure. Radioembolization was performed with Yttrium-90 SIR Spheres. Particles were administered via a microcatheter utilizing a completely enclosed system. Monitoring of antegrade flow was performed during administration under fluoroscopy with use of contrast intermittently. After administration of the particles, the microcatheter and 5 French catheter were removed and discarded along with the attached tubing and particle vial. Femoral puncture site was assessed with oblique arteriography. The sheath was re-prepped in sterile fashion. Arteriotomy closure was performed with the Exoseal device. FINDINGS: Initial angiography was performed to ensure durable occlusion of the gastroduodenal artery. The left  hepatic artery has a very short common trunk and microcatheter was advanced into both the medial and lateral branches of the left hepatic artery. The medial branch, supplying the segment 4 region, was previously treated with Y 90 radioembolization in July 2021. Vascularity in segment 4 has markedly decreased compared to July 2021. Lateral left hepatic artery branch is supplying segments 2 and 3. There appears to be some hypertrophy of the lateral branch since July 2021. Selective angiography of the left lateral branch demonstrates hypervascular areas compatible with known tumor. The catheter was positioned in the lateral branch and particles were successfully administered into the left hepatic artery and there was antegrade flow in this vessel at the end of the procedure. IMPRESSION: Successful Y 90 radioembolization to the left hepatic lobe lateral segment. Electronically Signed  By: Markus Daft M.D.   On: 11/19/2020 16:29   IR Angiogram Selective Each Additional Vessel  Result Date: 11/19/2020 INDICATION: 75 year old with multifocal hepatocellular carcinoma. Patient presents for treatment of the left hepatic lobe. EXAM: 1. Transcatheter radioembolization of left hepatic artery branch 2. Visceral angiography including common hepatic artery and left hepatic artery branches 3. Ultrasound guidance for vascular access Physician: Stephan Minister. Anselm Pancoast, MD MEDICATIONS: Cefoxitin 2 g. The antibiotic was administered within 1 hour of the procedure Additional Medications: 8 mg IV Decadron, 40 mg IV Protonix, 8 mg IV Zofran Y-90 dose: 18.4 mCi ANESTHESIA/SEDATION: Fentanyl 100 mcg IV; Versed 4 mg IV Moderate Sedation Time:  83 minutes The patient was continuously monitored during the procedure by the interventional radiology nurse under my direct supervision. CONTRAST:  38-ml Omnipaque-300/Visipaque 370 FLUOROSCOPY TIME:  Fluoroscopy Time: 12 minutes, 54 seconds, 5361 mGy COMPLICATIONS: None immediate. PROCEDURE: Informed consent  was obtained from the patient following explanation of the procedure, risks, benefits and alternatives. The patient understands, agrees and consents for the procedure. All questions were addressed. A time out was performed prior to the initiation of the procedure. Right groin was prepped and draped in sterile fashion. Maximal barrier sterile technique was utilized including caps, mask, sterile gowns, sterile gloves, sterile drape, hand hygiene and skin antiseptic. Ultrasound confirmed a patent right common femoral artery. Ultrasound image was saved for documentation. Skin was anesthetized using 1% lidocaine. Small incision was made. Using ultrasound guidance, 21 gauge needle was directed into the right common femoral artery and micropuncture dilator set was placed. Bentson wire was advanced into the abdominal aorta. A 5-French sheath was placed. A 5-French Cobra catheter was advanced and used to selectively catheterize the celiac axis. Catheter was advanced into the common hepatic artery. Arteriography was performed in the common hepatic artery using diluted contrast. A high-flow Renegade microcatheter was then advanced into the 2 main left hepatic arteries. Selective angiography was performed in each left hepatic artery. The lateral branch supplying segments 2 and 3 was selected for treatment. Catheter was securely positioned within the left hepatic artery branch for the radioembolization procedure. Radioembolization was performed with Yttrium-90 SIR Spheres. Particles were administered via a microcatheter utilizing a completely enclosed system. Monitoring of antegrade flow was performed during administration under fluoroscopy with use of contrast intermittently. After administration of the particles, the microcatheter and 5 French catheter were removed and discarded along with the attached tubing and particle vial. Femoral puncture site was assessed with oblique arteriography. The sheath was re-prepped in sterile  fashion. Arteriotomy closure was performed with the Exoseal device. FINDINGS: Initial angiography was performed to ensure durable occlusion of the gastroduodenal artery. The left hepatic artery has a very short common trunk and microcatheter was advanced into both the medial and lateral branches of the left hepatic artery. The medial branch, supplying the segment 4 region, was previously treated with Y 90 radioembolization in July 2021. Vascularity in segment 4 has markedly decreased compared to July 2021. Lateral left hepatic artery branch is supplying segments 2 and 3. There appears to be some hypertrophy of the lateral branch since July 2021. Selective angiography of the left lateral branch demonstrates hypervascular areas compatible with known tumor. The catheter was positioned in the lateral branch and particles were successfully administered into the left hepatic artery and there was antegrade flow in this vessel at the end of the procedure. IMPRESSION: Successful Y 90 radioembolization to the left hepatic lobe lateral segment. Electronically Signed   By: Quita Skye  Anselm Pancoast M.D.   On: 11/19/2020 16:29   IR Angiogram Selective Each Additional Vessel  Result Date: 11/19/2020 INDICATION: 75 year old with multifocal hepatocellular carcinoma. Patient presents for treatment of the left hepatic lobe. EXAM: 1. Transcatheter radioembolization of left hepatic artery branch 2. Visceral angiography including common hepatic artery and left hepatic artery branches 3. Ultrasound guidance for vascular access Physician: Stephan Minister. Anselm Pancoast, MD MEDICATIONS: Cefoxitin 2 g. The antibiotic was administered within 1 hour of the procedure Additional Medications: 8 mg IV Decadron, 40 mg IV Protonix, 8 mg IV Zofran Y-90 dose: 18.4 mCi ANESTHESIA/SEDATION: Fentanyl 100 mcg IV; Versed 4 mg IV Moderate Sedation Time:  83 minutes The patient was continuously monitored during the procedure by the interventional radiology nurse under my direct  supervision. CONTRAST:  38-ml Omnipaque-300/Visipaque 370 FLUOROSCOPY TIME:  Fluoroscopy Time: 12 minutes, 54 seconds, 5397 mGy COMPLICATIONS: None immediate. PROCEDURE: Informed consent was obtained from the patient following explanation of the procedure, risks, benefits and alternatives. The patient understands, agrees and consents for the procedure. All questions were addressed. A time out was performed prior to the initiation of the procedure. Right groin was prepped and draped in sterile fashion. Maximal barrier sterile technique was utilized including caps, mask, sterile gowns, sterile gloves, sterile drape, hand hygiene and skin antiseptic. Ultrasound confirmed a patent right common femoral artery. Ultrasound image was saved for documentation. Skin was anesthetized using 1% lidocaine. Small incision was made. Using ultrasound guidance, 21 gauge needle was directed into the right common femoral artery and micropuncture dilator set was placed. Bentson wire was advanced into the abdominal aorta. A 5-French sheath was placed. A 5-French Cobra catheter was advanced and used to selectively catheterize the celiac axis. Catheter was advanced into the common hepatic artery. Arteriography was performed in the common hepatic artery using diluted contrast. A high-flow Renegade microcatheter was then advanced into the 2 main left hepatic arteries. Selective angiography was performed in each left hepatic artery. The lateral branch supplying segments 2 and 3 was selected for treatment. Catheter was securely positioned within the left hepatic artery branch for the radioembolization procedure. Radioembolization was performed with Yttrium-90 SIR Spheres. Particles were administered via a microcatheter utilizing a completely enclosed system. Monitoring of antegrade flow was performed during administration under fluoroscopy with use of contrast intermittently. After administration of the particles, the microcatheter and 5 French  catheter were removed and discarded along with the attached tubing and particle vial. Femoral puncture site was assessed with oblique arteriography. The sheath was re-prepped in sterile fashion. Arteriotomy closure was performed with the Exoseal device. FINDINGS: Initial angiography was performed to ensure durable occlusion of the gastroduodenal artery. The left hepatic artery has a very short common trunk and microcatheter was advanced into both the medial and lateral branches of the left hepatic artery. The medial branch, supplying the segment 4 region, was previously treated with Y 90 radioembolization in July 2021. Vascularity in segment 4 has markedly decreased compared to July 2021. Lateral left hepatic artery branch is supplying segments 2 and 3. There appears to be some hypertrophy of the lateral branch since July 2021. Selective angiography of the left lateral branch demonstrates hypervascular areas compatible with known tumor. The catheter was positioned in the lateral branch and particles were successfully administered into the left hepatic artery and there was antegrade flow in this vessel at the end of the procedure. IMPRESSION: Successful Y 90 radioembolization to the left hepatic lobe lateral segment. Electronically Signed   By: Scherrie Gerlach.D.  On: 11/19/2020 16:29   NM Special Med Rad Physics Cons  Result Date: 11/19/2020 : CLINICAL DATA: Multifocal hepatocellular carcinoma. Secondtreatment to the LEFT hepatic lobe. Today's treatment treatments tosegments 2 and 3. Prior radioembolization to the central LEFThepatic lobe with 30 millicuries on 81/27/5170. Prior RIGHT hepaticlobe treatment with 30 millicuries on 01/74/9449 EXAM:NUCLEAR MEDICINE SPECIAL MED RAD PHYSICS CONS; NUCLEAR MEDICINERADIO PHARM THERAPY INTRA ARTERIAL; NUCLEAR MEDICINE TREATMENTPROCEDURE; NUCLEAR MEDICINE LIVER SCANTECHNIQUE:In conjunction with the interventional radiologist a Y- Microspheredose was calculated utilizing body  surface area formulation.Calculated dose equal 19.5 mCi. Pre therapy MAA liver SPECT scan andCTA were evaluated. Utilizing a microcatheter system, the hepaticartery was selected and Y-90 microspheres were delivered infractionated aliquots. Radiopharmaceutical was delivered by theinterventional radiologist and nuclear radiologist.The patient tolerated procedure well. No adverse effects were noted.Bremsstrahlung planar and SPECT imaging of the abdomen followingintrahepatic arterial delivery of Y-90 microsphere was performed. RADIOPHARMACEUTICALS: 67.5 millicuries yttrium 90 microspheres. COMPARISON: [facility] 05/16/2020, MRI 02/23/2022FINDINGS:Y - 90 microspheres therapy as above. Second therapy the LEFThepatic lobe. See clinical dataBremsstrahlung planar and SPECT imaging of the abdomen followingintrahepatic arterial delivery of Y-34microsphere demonstratesradioactivity localized to the LEFT hepatic lobe. No evidence ofextrahepatic activity. IMPRESSION:Successful Y - 90 microsphere delivery for treatment of unresectable liver metastasis. Second therapy to the LEFT lobe. See clinical data. Bremssstrahlung scan demonstrates activity localized to LEFT hepaticlobe with no extrahepatic activity identified. Electronically Signed   By: Suzy Bouchard M.D.   On: 11/19/2020 13:34   NM Special Treatment Procedure  Result Date: 11/19/2020 : CLINICAL DATA: Multifocal hepatocellular carcinoma. Secondtreatment to the LEFT hepatic lobe. Today's treatment treatments tosegments 2 and 3. Prior radioembolization to the central LEFThepatic lobe with 30 millicuries on 91/63/8466. Prior RIGHT hepaticlobe treatment with 30 millicuries on 59/93/5701 EXAM:NUCLEAR MEDICINE SPECIAL MED RAD PHYSICS CONS; NUCLEAR MEDICINERADIO PHARM THERAPY INTRA ARTERIAL; NUCLEAR MEDICINE TREATMENTPROCEDURE; NUCLEAR MEDICINE LIVER SCANTECHNIQUE:In conjunction with the interventional radiologist a Y- Microspheredose was calculated utilizing body surface area  formulation.Calculated dose equal 19.5 mCi. Pre therapy MAA liver SPECT scan andCTA were evaluated. Utilizing a microcatheter system, the hepaticartery was selected and Y-90 microspheres were delivered infractionated aliquots. Radiopharmaceutical was delivered by theinterventional radiologist and nuclear radiologist.The patient tolerated procedure well. No adverse effects were noted.Bremsstrahlung planar and SPECT imaging of the abdomen followingintrahepatic arterial delivery of Y-90 microsphere was performed. RADIOPHARMACEUTICALS: 77.9 millicuries yttrium 90 microspheres. COMPARISON: MAA 05/16/2020, MRI 02/23/2022FINDINGS:Y - 90 microspheres therapy as above. Second therapy the LEFThepatic lobe. See clinical dataBremsstrahlung planar and SPECT imaging of the abdomen followingintrahepatic arterial delivery of Y-48microsphere demonstratesradioactivity localized to the LEFT hepatic lobe. No evidence ofextrahepatic activity. IMPRESSION:Successful Y - 90 microsphere delivery for treatment of unresectable liver metastasis. Second therapy to the LEFT lobe. See clinical data. Bremssstrahlung scan demonstrates activity localized to LEFT hepaticlobe with no extrahepatic activity identified. Electronically Signed   By: Suzy Bouchard M.D.   On: 11/19/2020 13:34   IR US Guide Vasc Access Right  Result Date: 11/19/2020 INDICATION: 75 year old with multifocal hepatocellular carcinoma. Patient presents for treatment of the left hepatic lobe. EXAM: 1. Transcatheter radioembolization of left hepatic artery branch 2. Visceral angiography including common hepatic artery and left hepatic artery branches 3. Ultrasound guidance for vascular access Physician: Stephan Minister. Anselm Pancoast, MD MEDICATIONS: Cefoxitin 2 g. The antibiotic was administered within 1 hour of the procedure Additional Medications: 8 mg IV Decadron, 40 mg IV Protonix, 8 mg IV Zofran Y-90 dose: 18.4 mCi ANESTHESIA/SEDATION: Fentanyl 100 mcg IV; Versed 4 mg IV Moderate  Sedation Time:  83 minutes The patient  was continuously monitored during the procedure by the interventional radiology nurse under my direct supervision. CONTRAST:  38-ml Omnipaque-300/Visipaque 370 FLUOROSCOPY TIME:  Fluoroscopy Time: 12 minutes, 54 seconds, 8588 mGy COMPLICATIONS: None immediate. PROCEDURE: Informed consent was obtained from the patient following explanation of the procedure, risks, benefits and alternatives. The patient understands, agrees and consents for the procedure. All questions were addressed. A time out was performed prior to the initiation of the procedure. Right groin was prepped and draped in sterile fashion. Maximal barrier sterile technique was utilized including caps, mask, sterile gowns, sterile gloves, sterile drape, hand hygiene and skin antiseptic. Ultrasound confirmed a patent right common femoral artery. Ultrasound image was saved for documentation. Skin was anesthetized using 1% lidocaine. Small incision was made. Using ultrasound guidance, 21 gauge needle was directed into the right common femoral artery and micropuncture dilator set was placed. Bentson wire was advanced into the abdominal aorta. A 5-French sheath was placed. A 5-French Cobra catheter was advanced and used to selectively catheterize the celiac axis. Catheter was advanced into the common hepatic artery. Arteriography was performed in the common hepatic artery using diluted contrast. A high-flow Renegade microcatheter was then advanced into the 2 main left hepatic arteries. Selective angiography was performed in each left hepatic artery. The lateral branch supplying segments 2 and 3 was selected for treatment. Catheter was securely positioned within the left hepatic artery branch for the radioembolization procedure. Radioembolization was performed with Yttrium-90 SIR Spheres. Particles were administered via a microcatheter utilizing a completely enclosed system. Monitoring of antegrade flow was performed  during administration under fluoroscopy with use of contrast intermittently. After administration of the particles, the microcatheter and 5 French catheter were removed and discarded along with the attached tubing and particle vial. Femoral puncture site was assessed with oblique arteriography. The sheath was re-prepped in sterile fashion. Arteriotomy closure was performed with the Exoseal device. FINDINGS: Initial angiography was performed to ensure durable occlusion of the gastroduodenal artery. The left hepatic artery has a very short common trunk and microcatheter was advanced into both the medial and lateral branches of the left hepatic artery. The medial branch, supplying the segment 4 region, was previously treated with Y 90 radioembolization in July 2021. Vascularity in segment 4 has markedly decreased compared to July 2021. Lateral left hepatic artery branch is supplying segments 2 and 3. There appears to be some hypertrophy of the lateral branch since July 2021. Selective angiography of the left lateral branch demonstrates hypervascular areas compatible with known tumor. The catheter was positioned in the lateral branch and particles were successfully administered into the left hepatic artery and there was antegrade flow in this vessel at the end of the procedure. IMPRESSION: Successful Y 90 radioembolization to the left hepatic lobe lateral segment. Electronically Signed   By: Markus Daft M.D.   On: 11/19/2020 16:29   IR EMBO TUMOR ORGAN ISCHEMIA INFARCT INC GUIDE ROADMAPPING  Result Date: 11/19/2020 INDICATION: 75 year old with multifocal hepatocellular carcinoma. Patient presents for treatment of the left hepatic lobe. EXAM: 1. Transcatheter radioembolization of left hepatic artery branch 2. Visceral angiography including common hepatic artery and left hepatic artery branches 3. Ultrasound guidance for vascular access Physician: Stephan Minister. Anselm Pancoast, MD MEDICATIONS: Cefoxitin 2 g. The antibiotic was  administered within 1 hour of the procedure Additional Medications: 8 mg IV Decadron, 40 mg IV Protonix, 8 mg IV Zofran Y-90 dose: 18.4 mCi ANESTHESIA/SEDATION: Fentanyl 100 mcg IV; Versed 4 mg IV Moderate Sedation Time:  83 minutes The patient  was continuously monitored during the procedure by the interventional radiology nurse under my direct supervision. CONTRAST:  38-ml Omnipaque-300/Visipaque 370 FLUOROSCOPY TIME:  Fluoroscopy Time: 12 minutes, 54 seconds, 1610 mGy COMPLICATIONS: None immediate. PROCEDURE: Informed consent was obtained from the patient following explanation of the procedure, risks, benefits and alternatives. The patient understands, agrees and consents for the procedure. All questions were addressed. A time out was performed prior to the initiation of the procedure. Right groin was prepped and draped in sterile fashion. Maximal barrier sterile technique was utilized including caps, mask, sterile gowns, sterile gloves, sterile drape, hand hygiene and skin antiseptic. Ultrasound confirmed a patent right common femoral artery. Ultrasound image was saved for documentation. Skin was anesthetized using 1% lidocaine. Small incision was made. Using ultrasound guidance, 21 gauge needle was directed into the right common femoral artery and micropuncture dilator set was placed. Bentson wire was advanced into the abdominal aorta. A 5-French sheath was placed. A 5-French Cobra catheter was advanced and used to selectively catheterize the celiac axis. Catheter was advanced into the common hepatic artery. Arteriography was performed in the common hepatic artery using diluted contrast. A high-flow Renegade microcatheter was then advanced into the 2 main left hepatic arteries. Selective angiography was performed in each left hepatic artery. The lateral branch supplying segments 2 and 3 was selected for treatment. Catheter was securely positioned within the left hepatic artery branch for the radioembolization  procedure. Radioembolization was performed with Yttrium-90 SIR Spheres. Particles were administered via a microcatheter utilizing a completely enclosed system. Monitoring of antegrade flow was performed during administration under fluoroscopy with use of contrast intermittently. After administration of the particles, the microcatheter and 5 French catheter were removed and discarded along with the attached tubing and particle vial. Femoral puncture site was assessed with oblique arteriography. The sheath was re-prepped in sterile fashion. Arteriotomy closure was performed with the Exoseal device. FINDINGS: Initial angiography was performed to ensure durable occlusion of the gastroduodenal artery. The left hepatic artery has a very short common trunk and microcatheter was advanced into both the medial and lateral branches of the left hepatic artery. The medial branch, supplying the segment 4 region, was previously treated with Y 90 radioembolization in July 2021. Vascularity in segment 4 has markedly decreased compared to July 2021. Lateral left hepatic artery branch is supplying segments 2 and 3. There appears to be some hypertrophy of the lateral branch since July 2021. Selective angiography of the left lateral branch demonstrates hypervascular areas compatible with known tumor. The catheter was positioned in the lateral branch and particles were successfully administered into the left hepatic artery and there was antegrade flow in this vessel at the end of the procedure. IMPRESSION: Successful Y 90 radioembolization to the left hepatic lobe lateral segment. Electronically Signed   By: Markus Daft M.D.   On: 11/19/2020 16:29   NM Radio Pharm Therapy Intraarterial  Result Date: 11/19/2020 : CLINICAL DATA: Multifocal hepatocellular carcinoma. Secondtreatment to the LEFT hepatic lobe. Today's treatment treatments tosegments 2 and 3. Prior radioembolization to the central LEFThepatic lobe with 30 millicuries on  96/11/5407. Prior RIGHT hepaticlobe treatment with 30 millicuries on 81/19/1478 EXAM:NUCLEAR MEDICINE SPECIAL MED RAD PHYSICS CONS; NUCLEAR MEDICINERADIO PHARM THERAPY INTRA ARTERIAL; NUCLEAR MEDICINE TREATMENTPROCEDURE; NUCLEAR MEDICINE LIVER SCANTECHNIQUE:In conjunction with the interventional radiologist a Y- Microspheredose was calculated utilizing body surface area formulation.Calculated dose equal 19.5 mCi. Pre therapy MAA liver SPECT scan andCTA were evaluated. Utilizing a microcatheter system, the hepaticartery was selected and Y-90 microspheres were  delivered infractionated aliquots. Radiopharmaceutical was delivered by theinterventional radiologist and nuclear radiologist.The patient tolerated procedure well. No adverse effects were noted.Bremsstrahlung planar and SPECT imaging of the abdomen followingintrahepatic arterial delivery of Y-90 microsphere was performed. RADIOPHARMACEUTICALS: 02.5 millicuries yttrium 90 microspheres. COMPARISON: MAA 05/16/2020, MRI 02/23/2022FINDINGS:Y - 90 microspheres therapy as above. Second therapy the LEFThepatic lobe. See clinical dataBremsstrahlung planar and SPECT imaging of the abdomen followingintrahepatic arterial delivery of Y-2microsphere demonstratesradioactivity localized to the LEFT hepatic lobe. No evidence ofextrahepatic activity. IMPRESSION:Successful Y - 90 microsphere delivery for treatment of unresectable liver metastasis. Second therapy to the LEFT lobe. See clinical data. Bremssstrahlung scan demonstrates activity localized to LEFT hepaticlobe with no extrahepatic activity identified. Electronically Signed   By: Suzy Bouchard M.D.   On: 11/19/2020 13:34    Labs:  CBC: Recent Labs    09/19/20 0903 10/23/20 0949 11/19/20 0835 11/20/20 1412  WBC 5.0 5.4 6.1 9.4  HGB 12.2* 12.2* 11.9* 10.8*  HCT 40.2 40.4 38.7* 35.8*  PLT 266 244 290 264    COAGS: Recent Labs    02/14/20 0855 03/01/20 0825 04/19/20 0832 06/18/20 0900  11/19/20 0835  INR 0.9 1.0 1.0 1.0 1.0  APTT 30  --   --  35  --     BMP: Recent Labs    02/16/20 0825 03/01/20 0825 04/19/20 0832 05/08/20 0948 06/18/20 0900 09/19/20 0903 10/23/20 0949 11/19/20 0835 11/20/20 1412  NA 138 140 136 139   < > 139 140 137 137  K 5.0 4.8 4.6 4.8   < > 4.8 5.3* 4.0 4.5  CL 101 102 99 102   < > 104 104 102 102  CO2 28 25 25 28    < > 25 25 24 24   GLUCOSE 159* 136* 167* 107*   < > 125* 103* 120* 148*  BUN 25* 31* 27* 35*   < > 37* 35* 31* 32*  CALCIUM 9.3 9.6 9.6 9.7   < > 9.8 10.2 9.4 8.8*  CREATININE 1.63* 1.65* 1.64* 2.08*   < > 2.30* 2.10* 2.07* 2.04*  GFRNONAA 41* 41* 41* 31*   < > 29* 32* 33* 34*  GFRAA 48* 47* 47* 36*  --   --   --   --   --    < > = values in this interval not displayed.    LIVER FUNCTION TESTS: Recent Labs    09/19/20 0903 10/23/20 0949 11/19/20 0835 11/20/20 1412  BILITOT 0.5 0.6 0.9 0.4  AST 25 34 32 34  ALT 17 24 23 22   ALKPHOS 58 48 59 60  PROT 8.4* 8.4* 8.7* 8.0  ALBUMIN 4.0 4.2 4.3 3.7    TUMOR MARKERS: No results for input(s): AFPTM, CEA, CA199, CHROMGRNA in the last 8760 hours.  Assessment and Plan:  75 year old with multifocal hepatocellular carcinoma.  Patient has undergone multiple liver directed therapies as well as systemic therapy.  Due to progression of disease, patient underwent transcatheter Y 90 radioembolization to the left hepatic lobe lateral segment on 11/19/2020.  Patient tolerated the recent radioembolization procedure well and had no significant change in his liver enzymes or renal function following the procedure.  Patient is scheduled to follow-up with oncology next week and may restart Lenvima.  In addition, patient says that he has an appointment with a nephrologist.  Plan for an abdominal MRI in 3 months to evaluate the hepatic disease.  Hopefully we can get the follow-up MRI with contrast if the patient's renal function will allow.  Electronically Signed: Burman Riis 12/11/2020,  11:48 AM   I spent a total of    5 Minutes in remote  clinical consultation, greater than 50% of which was counseling/coordinating care for hepatocellular carcinoma.    Visit type: Audio only (telephone). Audio (no video) only due to Patient preference. Alternative for in-person consultation at [facility], [address], [place], [place]. This visit type was conducted due to national recommendations for restrictions regarding the COVID-19 Pandemic (e.g. social distancing).  This format is felt to be most appropriate for this patient at this time.  All issues noted in this document were discussed and addressed.  Patient ID: Aaron [name], male   DOB: 10/06/1945, 75 y.o.   MRN: [id number]

## 2020-12-13 ENCOUNTER — Other Ambulatory Visit: Payer: Self-pay

## 2020-12-13 ENCOUNTER — Inpatient Hospital Stay: Payer: Medicare PPO | Attending: Hematology

## 2020-12-13 DIAGNOSIS — C220 Liver cell carcinoma: Secondary | ICD-10-CM | POA: Diagnosis not present

## 2020-12-13 DIAGNOSIS — D472 Monoclonal gammopathy: Secondary | ICD-10-CM | POA: Insufficient documentation

## 2020-12-13 LAB — CMP (CANCER CENTER ONLY)
ALT: 24 U/L (ref 0–44)
AST: 42 U/L — ABNORMAL HIGH (ref 15–41)
Albumin: 3.8 g/dL (ref 3.5–5.0)
Alkaline Phosphatase: 67 U/L (ref 38–126)
Anion gap: 10 (ref 5–15)
BUN: 27 mg/dL — ABNORMAL HIGH (ref 8–23)
CO2: 27 mmol/L (ref 22–32)
Calcium: 9.4 mg/dL (ref 8.9–10.3)
Chloride: 102 mmol/L (ref 98–111)
Creatinine: 2.09 mg/dL — ABNORMAL HIGH (ref 0.61–1.24)
GFR, Estimated: 33 mL/min — ABNORMAL LOW (ref >60)
Glucose, Bld: 124 mg/dL — ABNORMAL HIGH (ref 70–99)
Potassium: 4.4 mmol/L (ref 3.5–5.1)
Sodium: 139 mmol/L (ref 135–145)
Total Bilirubin: 0.4 mg/dL (ref 0.3–1.2)
Total Protein: 8.1 g/dL (ref 6.5–8.1)

## 2020-12-13 LAB — CBC WITH DIFFERENTIAL/PLATELET
Abs Immature Granulocytes: 0.02 10*3/uL (ref 0.00–0.07)
Basophils Absolute: 0 10*3/uL (ref 0.0–0.1)
Basophils Relative: 1 %
Eosinophils Absolute: 0.2 10*3/uL (ref 0.0–0.5)
Eosinophils Relative: 4 %
HCT: 36.8 % — ABNORMAL LOW (ref 39.0–52.0)
Hemoglobin: 11.3 g/dL — ABNORMAL LOW (ref 13.0–17.0)
Immature Granulocytes: 1 %
Lymphocytes Relative: 17 %
Lymphs Abs: 0.7 10*3/uL (ref 0.7–4.0)
MCH: 22.5 pg — ABNORMAL LOW (ref 26.0–34.0)
MCHC: 30.7 g/dL (ref 30.0–36.0)
MCV: 73.3 fL — ABNORMAL LOW (ref 80.0–100.0)
Monocytes Absolute: 0.5 10*3/uL (ref 0.1–1.0)
Monocytes Relative: 11 %
Neutro Abs: 2.8 10*3/uL (ref 1.7–7.7)
Neutrophils Relative %: 66 %
Platelets: 225 10*3/uL (ref 150–400)
RBC: 5.02 MIL/uL (ref 4.22–5.81)
RDW: 15.5 % (ref 11.5–15.5)
WBC: 4.3 10*3/uL (ref 4.0–10.5)
nRBC: 0 % (ref 0.0–0.2)

## 2020-12-14 LAB — AFP TUMOR MARKER: AFP, Serum, Tumor Marker: 120 ng/mL — ABNORMAL HIGH (ref 0.0–8.4)

## 2020-12-15 NOTE — Progress Notes (Signed)
HEMATOLOGY/ONCOLOGY CLINIC NOTE  Date of Service: 12/16/2020  Patient Care Team: Aaron Gravel, MD as PCP - General (Internal Medicine)  CHIEF COMPLAINTS/PURPOSE OF CONSULTATION:  F/u for Aaron [name] on Aaron [name]  HISTORY OF PRESENTING ILLNESS:   Aaron [name] is a wonderful 75 y.o. male who has been referred to [place] by Dr Aaron [name] for evaluation and management of monoclonal gammopathy of undetermined significance.  Patient has history of hypertension, diabetes, dyslipidemia, hepatocellular carcinoma (rx with TACE and percutaneous thermal ablation), hepatitis C status post treatment, iron deficiency anemia in 2014 treated with oral iron.  Patient had an SPEP done by his primary care physician on 10/04/2015 that showed an M spike of 0.3 g/dL. It was presumably will be done due to his complaints of fatigue. He has had no significant anemia. No new bone pains. No fevers/chills/drenching night sweats.  No new anemia..  Outside labs show hemoglobin of 12.4 with microcytosis with an MCV of 70.5, normal WBC count of 5.1k.  No evidence of hypercalcemia or significant renal failure on his outside labs.   INTERVAL HISTORY: I connected with Aaron [name] on 12/16/2020 by telephone and verified that I am speaking with the correct person using two identifiers.   I discussed the limitations of evaluation and management by telemedicine. The patient expressed understanding and agreed to proceed.   Other persons participating in the visit and their role in the encounter:                                                         - Aaron [name], Medical Scribe     Patient's location: Home Provider's location: [facility] [place] is here for his scheduled follow-up for [facility]. The patient's last visit with [place] was on 11/20/2020. The pt reports that he is doing well overall. We are joined today by his wife.  The pt reports no new concerns or symptoms. The pt notes he has been very stable and  continuing to eat well. The pt notes his fatigue has been settling down and he has been able to do more physical activities, such as mowing the yard recently. The pt notes that his follow-up MRI has not been scheduled and that his other doctor wanted to wait two months more prior to getting this.  Lab results 12/13/2020 of CBC w/diff and CMP is as follows: all values are WNL except for Hgb of 11.3, HCT of 36.8, MCV of 73.3, MCH of 22.5, Glucose of 124, BUN of 27, Creatinine of 2.09, AST of 42, GFR est of 33. 12/13/2020 AFP Tumor Marker of 120.0.  On review of systems, pt reports intermittent abdominal bloating and denies abdominal pain, nausea, vomiting, worsening fatigue, and any other symptoms.  MEDICAL HISTORY:  Past Medical History:  Diagnosis Date  . Arthritis   . Diabetes mellitus without complication ([place])    type II   . Elevated liver enzymes   . Fatty liver   . Hepatitis C    genotype 1A status post treatment with Viekira and Ribavarin for 24 weeks  . Hepatocellular carcinoma ([place]) 01/30/2014   Path  . History of colon polyps   . Hyperlipidemia   . Hypertension   . Iron deficiency anemia    hx of   . MGUS (monoclonal gammopathy of  unknown significance)    Obesity Hepatocellular carcinoma treated with TACE and percutaneous thermal ablation by interventional radiology. Last MRI on 08/06/2015 showed slight decrease in the size of the ablation defect involving the right lobe of the liver area and no findings to suggest residual or recurrent hepatocellular carcinoma. Mild changes of liver cirrhosis. MRI Abd 06/24/2016- no evidence of recurrent HCC   Hepatitis C genotype 1A status post treatment with Viekira and Ribavarin for 24 weeks.  Monoclonal gammopathy of undetermined significance.  SURGICAL HISTORY:  Status post microwave ablation[October 2015] of liver lesion and TACE [July 2015] for [name]. EGD and colonoscopy in 2016   SOCIAL HISTORY: Social History   Socioeconomic  History  . Marital status: Married    Spouse name: Not on file  . Number of children: Not on file  . Years of education: Not on file  . Highest education level: Not on file  Occupational History  . Not on file  Tobacco Use  . Smoking status: Former Smoker    Quit date: 01/30/1994    Years since quitting: 26.8  . Smokeless tobacco: Never Used  . Tobacco comment: stopped 25 yrs ago  Vaping Use  . Vaping Use: Never used  Substance and Sexual Activity  . Alcohol use: No    Comment: stopped 30 years ago   . Drug use: No  . Sexual activity: Not Currently  Other Topics Concern  . Not on file  Social History Narrative  . Not on file   Social Determinants of Health   Financial Resource Strain: Not on file  Food Insecurity: Not on file  Transportation Needs: Not on file  Physical Activity: Not on file  Stress: Not on file  Social Connections: Not on file  Intimate Partner Violence: Not on file  Former smoker and smoked 1 pack per day for about 20 years starting at age 36 quit 1 years ago.  FAMILY HISTORY: No family history on file.  ALLERGIES:  has No Known Allergies.  MEDICATIONS:  Current Outpatient Medications  Medication Sig Dispense Refill  . ACCU-CHEK AVIVA PLUS test strip     . Accu-Chek Softclix Lancets lancets     . ALPRAZolam (XANAX) 0.5 MG tablet Take 1 tablet (0.5 mg total) by mouth as directed. Take 2 tablets (1.0 mg total) prior to MR scan.  Make take an additional 0.5 mg po if needed. (Patient not taking: No sig reported) 3 tablet 0  . amLODipine (NORVASC) 10 MG tablet Take 1 tablet (10 mg total) by mouth daily. 30 tablet 5  . aspirin EC 81 MG tablet Take 81 mg by mouth every morning.     . cholecalciferol (VITAMIN D) 1000 UNITS tablet Take 1,000 Units by mouth every morning.     . Insulin Glargine (LANTUS SOLOSTAR) 100 UNIT/ML Solostar Pen Inject 32 Units into the skin daily.    [name] Kitchen Aaron [name] 12 mg daily dose (LENVIMA) 3 x 4 MG capsule TAKE 3 CAPSULES (12MG) BY  MOUTH DAILY. 90 each 2  . lisinopril-hydrochlorothiazide (ZESTORETIC) 20-12.5 MG tablet TAKE 2 TABLETS BY MOUTH DAILY WITH BREAKFAST. 60 tablet 0  . metFORMIN (GLUCOPHAGE) 1000 MG tablet Take 1,000 mg by mouth 2 (two) times daily with a meal.    . Multiple Vitamin (MULTIVITAMIN WITH MINERALS) TABS tablet Take 1 tablet daily by mouth.    [name] Kitchen omeprazole (PRILOSEC) 20 MG capsule Take 1 capsule (20 mg total) by mouth daily. (Patient not taking: Reported on 11/20/2020) 30 capsule 0  . oxyCODONE (  OXY IR/ROXICODONE) 5 MG immediate release tablet Take 1 tablet (5 mg total) by mouth every 6 (six) hours as needed for severe pain. 15 tablet 0  . PAZEO 0.7 % SOLN Place 1 drop into both eyes every morning.   3   Current Facility-Administered Medications  Medication Dose Route Frequency Provider Last Rate Last Admin  . oxyCODONE (Oxy IR/ROXICODONE) immediate release tablet 5 mg  5 mg Oral Q6H PRN Jacqualine Mau, NP        REVIEW OF SYSTEMS:   10 Point review of Systems was done is negative except as noted above.   PHYSICAL EXAMINATION: ECOG FS:1 - Symptomatic but completely ambulatory  There were no vitals filed for this visit. Wt Readings from Last 3 Encounters:  11/20/20 246 lb 6.4 oz (111.8 kg)  10/23/20 243 lb (110.2 kg)  09/19/20 241 lb 1.6 oz (109.4 kg)   There is no height or weight on file to calculate BMI.    Telehealth Visit.  LABORATORY DATA:  I have reviewed the data as listed  CBC Latest Ref Rng & Units 12/13/2020 11/20/2020 11/19/2020  WBC 4.0 - 10.5 K/uL 4.3 9.4 6.1  Hemoglobin 13.0 - 17.0 g/dL 11.3(L) 10.8(L) 11.9(L)  Hematocrit 39.0 - 52.0 % 36.8(L) 35.8(L) 38.7(L)  Platelets 150 - 400 K/uL 225 264 290    CBC    Component Value Date/Time   WBC 4.3 12/13/2020 0801   RBC 5.02 12/13/2020 0801   HGB 11.3 (L) 12/13/2020 0801   HGB 10.8 (L) 11/20/2020 1412   HGB 11.5 (L) 03/22/2017 1402   HCT 36.8 (L) 12/13/2020 0801   HCT 37.0 (L) 03/22/2017 1402   PLT 225 12/13/2020  0801   PLT 264 11/20/2020 1412   PLT 262 03/22/2017 1402   MCV 73.3 (L) 12/13/2020 0801   MCV 72.3 (L) 03/22/2017 1402   MCH 22.5 (L) 12/13/2020 0801   MCHC 30.7 12/13/2020 0801   RDW 15.5 12/13/2020 0801   RDW 15.6 (H) 03/22/2017 1402   LYMPHSABS 0.7 12/13/2020 0801   LYMPHSABS 1.8 03/22/2017 1402   MONOABS 0.5 12/13/2020 0801   MONOABS 0.4 03/22/2017 1402   EOSABS 0.2 12/13/2020 0801   EOSABS 0.1 03/22/2017 1402   BASOSABS 0.0 12/13/2020 0801   BASOSABS 0.0 03/22/2017 1402    . CMP Latest Ref Rng & Units 12/13/2020 11/20/2020 11/19/2020  Glucose 70 - 99 mg/dL 124(H) 148(H) 120(H)  BUN 8 - 23 mg/dL 27(H) 32(H) 31(H)  Creatinine 0.61 - 1.24 mg/dL 2.09(H) 2.04(H) 2.07(H)  Sodium 135 - 145 mmol/L 139 137 137  Potassium 3.5 - 5.1 mmol/L 4.4 4.5 4.0  Chloride 98 - 111 mmol/L 102 102 102  CO2 22 - 32 mmol/L _0 Calcium 8.9 - 10.3 mg/dL 9.4 8.8(L) 9.4  Total Protein 6.5 - 8.1 g/dL 8.1 8.0 8.7(H)  Total Bilirubin 0.3 - 1.2 mg/dL 0.4 0.4 0.9  Alkaline Phos 38 - 126 U/L 67 60 59  AST 15 - 41 U/L 42(H) 34 32  ALT 0 - 44 U/L _1 06/21/2019 MR ABDOMEN WWO CONTRAST (Accession [id number])     IFE 1  Comment   Comments: Immunofixation shows IgG monoclonal protein with kappa light chain  specificity.           RADIOGRAPHIC STUDIES:  MRI abd w and wo contrast: 12/17/2016: IMPRESSION: 1. Postprocedural changes of thermal ablation again noted in the right lobe of the liver, without definitive evidence to suggest local recurrence of  disease. No new hepatic lesions are noted. 2. Additional incidental findings, as above.   Electronically Signed   By: Vinnie Langton M.D.   On: 12/17/2016 09:42    MRI abd w and wo contrast 01/26/2020 IMPRESSION: 1. Interval enlargement of multiple hepatic masses as detailed above, consistent with progression of multifocal hepatocellular carcinoma. There is evidence of prior ablation in hepatic segments VII and VIII. 2. No  evidence of metastatic disease in the abdomen.  Electronically Signed   By: Eddie Candle M.D.   On: 01/27/2020 15:51   ASSESSMENT & PLAN:   75 y.o. male with  #1 Monoclonal gammopathy of undetermined significance.  M protein 0.2 mg/dL immunofixation showing IgG monoclonal protein with kappa light chain specificity. SPEP 02/2016 - 0.3g/dl  No overt anemia.  No overt hypercalcemia. Stable CKD creatinine 1.6  #2 bone lucencies in the right radial mid midshaft and left humeral head. PET/CT did not show any hypermetabolic bone lesions. MRI left shoulder showed no evidence of metastatic disease or multiple myeloma. The x-ray findings appear to be areas of mild demineralization. Patient was seen by orthopedics and no additional recommendations were given.  Plan -Patient's anemia is stable. No significant change in renal function. No new bone pains. No hypercalcemia . No overall no overt evidence of progression of multiple myeloma. -Myeloma labs from today show stable M protein @ 0.2g/dl with no evidence of progression- consistent with MGUS.  #3 Hepatocellular carcinoma treated with TACE and percutaneous thermal ablation by interventional radiology.   Last MRI on 08/06/2015 showed slight decrease in the size of the ablation defect involving the right lobe of the liver area and no findings to suggest residual or recurrent hepatocellular carcinoma. Mild changes of liver cirrhosis.  01/16/2016 MRI Abdomen showed resolution of previously ablated lesion but a new 1.3 cm lesion was noted which was subsequently ablated under CT guidance by interventional radiology on 01/31/2016. Elevated transaminases on 02/01/2016 were likely related to his ablation. These have since resolved.   06/24/2016 MRI Abdomen showed no evidence of recurrent hepatocellular carcinoma . 12/17/2016 MRI Abdomen shows no evidence of recurrent hepatocellular carcinoma .  10/26/17 MRI Abdomen revealed Motion degraded images. Status post  thermal ablation of the segment 8 lesion, without enhancement to suggest residual viable tumor. Prior ablation of a segment 7 lesion, grossly unchanged. No convincing central enhancement  03/14/2019  MRI abdomen w and wo contrast revealed "1. New areas of restricted diffusion, surrounding nodular arterial phase enhancement and delayed washout surrounding the ablation zone defects anteriorly in segments 8 and 7 consistent with local recurrence of hepatocellular carcinoma. 2. The peripheral ablation zone defect laterally in segment 8 is unchanged. 3. No extrahepatic tumor identified."  10/02/2019 MRI Abdomen ([id number]) revealed "1. Progressive multifocal hepatocellular carcinoma, especially anteriorly around the ablated lesion in segments 4B and 8. There are additional new lesions in segments 2 and 3. No evidence of extrahepatic metastatic disease."  01/26/2020 MRI Abdomen ([id number]) revealed "1. Interval enlargement of multiple hepatic masses as detailed above, consistent with progression of multifocal hepatocellular carcinoma. There is evidence of prior ablation in hepatic segments VII and VIII. 2. No evidence of metastatic disease in the abdomen."  #4 Hepatitis C genotype 1A status post treatment with Viekira and Ribavarin for 24 weeks.   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    PLAN:  -Discussed pt labwork, 12/13/2020; blood counts stable, chemistries stable, AFP Tumor marker improved from 744 to 120.  -  Will restart Lenvima at this time. The pt has no residual toxicities from the Y-90 treatment and all labs have been stable for over a month while holding it at this time.  -Will send message to pharmacist Leron Croak) that pt is restarting back on Lenvima so that they are aware. Will send in refills as needed. -Recommend pt try to increase water intake to 48-64 oz daily. -Advised pt he can gradually increase Lantus  if sugars consisently above 180. -Will get repeat labs in 1 month. Will get repeat MRI in two months. -Will see back in 1 month via phone. Labs prior to this call.  FOLLOW UP: Phone visit with Dr Irene Limbo in 4 weeks. Labs 2 days prior to phone visit MRI abd w and wo contrast in 2 months   The total time spent in the appointment was 20 minutes and more than 50% was on counseling and direct patient cares.    All of the patient's questions were answered with apparent satisfaction. The patient knows to call the clinic with any problems, questions or concerns.   [name] MD [place] AAHIVMS [name] Hematology/Oncology Physician [name] [place]  (Office):       [phone] (Work cell):  [phone] (Fax):           [phone]   I, Aaron [name], am acting as scribe for Dr. [name], MD.   .I have reviewed the above documentation for accuracy and completeness, and I agree with the above. Brunetta Genera MD

## 2020-12-16 ENCOUNTER — Telehealth: Payer: Self-pay | Admitting: Hematology

## 2020-12-16 ENCOUNTER — Inpatient Hospital Stay (HOSPITAL_BASED_OUTPATIENT_CLINIC_OR_DEPARTMENT_OTHER): Payer: Medicare PPO | Admitting: Hematology

## 2020-12-16 DIAGNOSIS — C220 Liver cell carcinoma: Secondary | ICD-10-CM | POA: Diagnosis not present

## 2020-12-16 NOTE — Telephone Encounter (Signed)
Left message with follow-up appointment per 4/25 los. Gave option to call back to reschedule if needed.

## 2020-12-20 ENCOUNTER — Other Ambulatory Visit (HOSPITAL_COMMUNITY): Payer: Self-pay

## 2020-12-23 ENCOUNTER — Other Ambulatory Visit (HOSPITAL_COMMUNITY): Payer: Self-pay

## 2020-12-23 MED ORDER — LENVATINIB (12 MG DAILY DOSE) 3 X 4 MG PO CPPK
ORAL_CAPSULE | ORAL | 2 refills | Status: DC
  Filled 2020-12-23: qty 90, fill #0
  Filled 2021-01-15: qty 90, 30d supply, fill #0
  Filled 2021-02-17: qty 90, 30d supply, fill #1
  Filled 2021-03-24: qty 90, 30d supply, fill #2
  Filled ????-??-??: fill #1

## 2021-01-04 ENCOUNTER — Other Ambulatory Visit: Payer: Self-pay | Admitting: Hematology

## 2021-01-07 ENCOUNTER — Other Ambulatory Visit (HOSPITAL_COMMUNITY): Payer: Self-pay

## 2021-01-10 ENCOUNTER — Inpatient Hospital Stay: Payer: Medicare PPO | Attending: Hematology

## 2021-01-10 ENCOUNTER — Other Ambulatory Visit: Payer: Self-pay

## 2021-01-10 ENCOUNTER — Other Ambulatory Visit: Payer: Self-pay | Admitting: *Deleted

## 2021-01-10 DIAGNOSIS — D472 Monoclonal gammopathy: Secondary | ICD-10-CM | POA: Insufficient documentation

## 2021-01-10 DIAGNOSIS — C220 Liver cell carcinoma: Secondary | ICD-10-CM | POA: Insufficient documentation

## 2021-01-10 LAB — CBC WITH DIFFERENTIAL (CANCER CENTER ONLY)
Abs Immature Granulocytes: 0.01 10*3/uL (ref 0.00–0.07)
Basophils Absolute: 0 10*3/uL (ref 0.0–0.1)
Basophils Relative: 1 %
Eosinophils Absolute: 0.2 10*3/uL (ref 0.0–0.5)
Eosinophils Relative: 7 %
HCT: 38.9 % — ABNORMAL LOW (ref 39.0–52.0)
Hemoglobin: 12 g/dL — ABNORMAL LOW (ref 13.0–17.0)
Immature Granulocytes: 0 %
Lymphocytes Relative: 17 %
Lymphs Abs: 0.6 10*3/uL — ABNORMAL LOW (ref 0.7–4.0)
MCH: 22.8 pg — ABNORMAL LOW (ref 26.0–34.0)
MCHC: 30.8 g/dL (ref 30.0–36.0)
MCV: 74 fL — ABNORMAL LOW (ref 80.0–100.0)
Monocytes Absolute: 0.5 10*3/uL (ref 0.1–1.0)
Monocytes Relative: 12 %
Neutro Abs: 2.3 10*3/uL (ref 1.7–7.7)
Neutrophils Relative %: 63 %
Platelet Count: 202 10*3/uL (ref 150–400)
RBC: 5.26 MIL/uL (ref 4.22–5.81)
RDW: 15.1 % (ref 11.5–15.5)
WBC Count: 3.7 10*3/uL — ABNORMAL LOW (ref 4.0–10.5)
nRBC: 0 % (ref 0.0–0.2)

## 2021-01-10 LAB — CMP (CANCER CENTER ONLY)
ALT: 19 U/L (ref 0–44)
AST: 31 U/L (ref 15–41)
Albumin: 3.6 g/dL (ref 3.5–5.0)
Alkaline Phosphatase: 79 U/L (ref 38–126)
Anion gap: 10 (ref 5–15)
BUN: 32 mg/dL — ABNORMAL HIGH (ref 8–23)
CO2: 28 mmol/L (ref 22–32)
Calcium: 9.4 mg/dL (ref 8.9–10.3)
Chloride: 101 mmol/L (ref 98–111)
Creatinine: 2.17 mg/dL — ABNORMAL HIGH (ref 0.61–1.24)
GFR, Estimated: 31 mL/min — ABNORMAL LOW (ref >60)
Glucose, Bld: 197 mg/dL — ABNORMAL HIGH (ref 70–99)
Potassium: 4.9 mmol/L (ref 3.5–5.1)
Sodium: 139 mmol/L (ref 135–145)
Total Bilirubin: 0.4 mg/dL (ref 0.3–1.2)
Total Protein: 7.9 g/dL (ref 6.5–8.1)

## 2021-01-10 NOTE — Progress Notes (Signed)
HEMATOLOGY/ONCOLOGY CLINIC NOTE  Date of Service: 01/10/2021  Patient Care Team: Aaron Gravel, MD as PCP - General (Internal Medicine)  CHIEF COMPLAINTS/PURPOSE OF CONSULTATION:  F/u for [facility] on Lenvatinib  HISTORY OF PRESENTING ILLNESS:   Aaron [name] is a wonderful 75 y.o. male who has been referred to [place] by Dr Aaron [name] for evaluation and management of monoclonal gammopathy of undetermined significance.  Patient has history of hypertension, diabetes, dyslipidemia, hepatocellular carcinoma (rx with TACE and percutaneous thermal ablation), hepatitis C status post treatment, iron deficiency anemia in 2014 treated with oral iron.  Patient had an SPEP done by his primary care physician on 10/04/2015 that showed an M spike of 0.3 g/dL. It was presumably will be done due to his complaints of fatigue. He has had no significant anemia. No new bone pains. No fevers/chills/drenching night sweats.  No new anemia..  Outside labs show hemoglobin of 12.4 with microcytosis with an MCV of 70.5, normal WBC count of 5.1k.  No evidence of hypercalcemia or significant renal failure on his outside labs.   INTERVAL HISTORY: I connected with Aaron [name] on 01/13/2021 by telephone and verified that I am speaking with the correct person using two identifiers.   I discussed the limitations of evaluation and management by telemedicine. The patient expressed understanding and agreed to proceed.   Other persons participating in the visit and their role in the encounter:                                                         - Aaron [name], Medical Scribe     Patient's location: Home Provider's location: [place] [facility] [place] is here for his scheduled follow-up for [facility]. The patient's last visit with [place] was on 12/16/2020. The pt reports that he is doing well overall.  The pt reports no new concerns or symptoms. He has tolerated the Lenvima well with no issues. He is taking 12.5 mg daily  (TID).   Lab results 01/10/2021 of CBC w/diff and CMP is as follows: all values are WNL except for Glucose of 197, BUN of 32, Creatinine of 2.17, GFR est of 31, WBC of 3.7K, Hgb of 12.0, HCT of 38.9, MCV of 74.0, MCH of 22.8, Lymphs Abs of 0.6K. 01/10/2021 AFP Tumor Marker of 13.8.  On review of systems, pt denies acute rashes, fevers, infection issues, and any other symptoms.   MEDICAL HISTORY:  Past Medical History:  Diagnosis Date  . Arthritis   . Diabetes mellitus without complication ([place])    type II   . Elevated liver enzymes   . Fatty liver   . Hepatitis C    genotype 1A status post treatment with Viekira and Ribavarin for 24 weeks  . Hepatocellular carcinoma ([place]) 01/30/2014   Path  . History of colon polyps   . Hyperlipidemia   . Hypertension   . Iron deficiency anemia    hx of   . MGUS (monoclonal gammopathy of unknown significance)    Obesity Hepatocellular carcinoma treated with TACE and percutaneous thermal ablation by interventional radiology. Last MRI on 08/06/2015 showed slight decrease in the size of the ablation defect involving the right lobe of the liver area and no findings to suggest residual or recurrent hepatocellular carcinoma. Mild changes of liver cirrhosis.  MRI Abd 06/24/2016- no evidence of recurrent HCC   Hepatitis C genotype 1A status post treatment with Viekira and Ribavarin for 24 weeks.  Monoclonal gammopathy of undetermined significance.  SURGICAL HISTORY:  Status post microwave ablation[October 2015] of liver lesion and TACE [July 2015] for [place]. EGD and colonoscopy in 2016   SOCIAL HISTORY: Social History   Socioeconomic History  . Marital status: Married    Spouse name: Not on file  . Number of children: Not on file  . Years of education: Not on file  . Highest education level: Not on file  Occupational History  . Not on file  Tobacco Use  . Smoking status: Former Smoker    Quit date: 01/30/1994    Years since quitting: 26.9  .  Smokeless tobacco: Never Used  . Tobacco comment: stopped 25 yrs ago  Vaping Use  . Vaping Use: Never used  Substance and Sexual Activity  . Alcohol use: No    Comment: stopped 30 years ago   . Drug use: No  . Sexual activity: Not Currently  Other Topics Concern  . Not on file  Social History Narrative  . Not on file   Social Determinants of Health   Financial Resource Strain: Not on file  Food Insecurity: Not on file  Transportation Needs: Not on file  Physical Activity: Not on file  Stress: Not on file  Social Connections: Not on file  Intimate Partner Violence: Not on file  Former smoker and smoked 1 pack per day for about 20 years starting at age 39 quit 33 years ago.  FAMILY HISTORY: No family history on file.  ALLERGIES:  has No Known Allergies.  MEDICATIONS:  Current Outpatient Medications  Medication Sig Dispense Refill  . ACCU-CHEK AVIVA PLUS test strip     . Accu-Chek Softclix Lancets lancets     . ALPRAZolam (XANAX) 0.5 MG tablet Take 1 tablet (0.5 mg total) by mouth as directed. Take 2 tablets (1.0 mg total) prior to MR scan.  Make take an additional 0.5 mg po if needed. (Patient not taking: No sig reported) 3 tablet 0  . amLODipine (NORVASC) 10 MG tablet Take 1 tablet (10 mg total) by mouth daily. 30 tablet 5  . aspirin EC 81 MG tablet Take 81 mg by mouth every morning.     . cholecalciferol (VITAMIN D) 1000 UNITS tablet Take 1,000 Units by mouth every morning.     . Insulin Glargine (LANTUS SOLOSTAR) 100 UNIT/ML Solostar Pen Inject 32 Units into the skin daily.    [name] Kitchen lenvatinib 12 mg daily dose (LENVIMA) 3 x 4 MG capsule TAKE 3 CAPSULES (12MG) BY MOUTH DAILY. 90 each 2  . lisinopril-hydrochlorothiazide (ZESTORETIC) 20-12.5 MG tablet TAKE 2 TABLETS BY MOUTH DAILY WITH BREAKFAST. 60 tablet 0  . metFORMIN (GLUCOPHAGE) 1000 MG tablet Take 1,000 mg by mouth 2 (two) times daily with a meal.    . Multiple Vitamin (MULTIVITAMIN WITH MINERALS) TABS tablet Take 1 tablet  daily by mouth.    [name] Kitchen omeprazole (PRILOSEC) 20 MG capsule Take 1 capsule (20 mg total) by mouth daily. (Patient not taking: Reported on 11/20/2020) 30 capsule 0  . oxyCODONE (OXY IR/ROXICODONE) 5 MG immediate release tablet Take 1 tablet (5 mg total) by mouth every 6 (six) hours as needed for severe pain. 15 tablet 0  . PAZEO 0.7 % SOLN Place 1 drop into both eyes every morning.   3   Current Facility-Administered Medications  Medication Dose Route Frequency Provider  Last Rate Last Admin  . oxyCODONE (Oxy IR/ROXICODONE) immediate release tablet 5 mg  5 mg Oral Q6H PRN Jacqualine Mau, NP        REVIEW OF SYSTEMS:   10 Point review of Systems was done is negative except as noted above.   PHYSICAL EXAMINATION: ECOG FS:1 - Symptomatic but completely ambulatory  There were no vitals filed for this visit. Wt Readings from Last 3 Encounters:  11/20/20 246 lb 6.4 oz (111.8 kg)  10/23/20 243 lb (110.2 kg)  09/19/20 241 lb 1.6 oz (109.4 kg)   There is no height or weight on file to calculate BMI.    Telehealth Visit.  LABORATORY DATA:  I have reviewed the data as listed  CBC Latest Ref Rng & Units 01/10/2021 12/13/2020 11/20/2020  WBC 4.0 - 10.5 K/uL 3.7(L) 4.3 9.4  Hemoglobin 13.0 - 17.0 g/dL 12.0(L) 11.3(L) 10.8(L)  Hematocrit 39.0 - 52.0 % 38.9(L) 36.8(L) 35.8(L)  Platelets 150 - 400 K/uL 202 225 264    CBC    Component Value Date/Time   WBC 3.7 (L) 01/10/2021 0919   WBC 4.3 12/13/2020 0801   RBC 5.26 01/10/2021 0919   HGB 12.0 (L) 01/10/2021 0919   HGB 11.5 (L) 03/22/2017 1402   HCT 38.9 (L) 01/10/2021 0919   HCT 37.0 (L) 03/22/2017 1402   PLT 202 01/10/2021 0919   PLT 262 03/22/2017 1402   MCV 74.0 (L) 01/10/2021 0919   MCV 72.3 (L) 03/22/2017 1402   MCH 22.8 (L) 01/10/2021 0919   MCHC 30.8 01/10/2021 0919   RDW 15.1 01/10/2021 0919   RDW 15.6 (H) 03/22/2017 1402   LYMPHSABS 0.6 (L) 01/10/2021 0919   LYMPHSABS 1.8 03/22/2017 1402   MONOABS 0.5 01/10/2021 0919    MONOABS 0.4 03/22/2017 1402   EOSABS 0.2 01/10/2021 0919   EOSABS 0.1 03/22/2017 1402   BASOSABS 0.0 01/10/2021 0919   BASOSABS 0.0 03/22/2017 1402    . CMP Latest Ref Rng & Units 01/10/2021 12/13/2020 11/20/2020  Glucose 70 - 99 mg/dL 197(H) 124(H) 148(H)  BUN 8 - 23 mg/dL 32(H) 27(H) 32(H)  Creatinine 0.61 - 1.24 mg/dL 2.17(H) 2.09(H) 2.04(H)  Sodium 135 - 145 mmol/L 139 139 137  Potassium 3.5 - 5.1 mmol/L 4.9 4.4 4.5  Chloride 98 - 111 mmol/L 101 102 102  CO2 22 - 32 mmol/L 28 27 24   Calcium 8.9 - 10.3 mg/dL 9.4 9.4 8.8(L)  Total Protein 6.5 - 8.1 g/dL 7.9 8.1 8.0  Total Bilirubin 0.3 - 1.2 mg/dL 0.4 0.4 0.4  Alkaline Phos 38 - 126 U/L 79 67 60  AST 15 - 41 U/L 31 42(H) 34  ALT 0 - 44 U/L 19 24 22     06/21/2019 MR ABDOMEN WWO CONTRAST (Accession [id number])     IFE 1  Comment   Comments: Immunofixation shows IgG monoclonal protein with kappa light chain  specificity.           RADIOGRAPHIC STUDIES:  MRI abd w and wo contrast: 12/17/2016: IMPRESSION: 1. Postprocedural changes of thermal ablation again noted in the right lobe of the liver, without definitive evidence to suggest local recurrence of disease. No new hepatic lesions are noted. 2. Additional incidental findings, as above.   Electronically Signed   By: Vinnie Langton M.D.   On: 12/17/2016 09:42    MRI abd w and wo contrast 01/26/2020 IMPRESSION: 1. Interval enlargement of multiple hepatic masses as detailed above, consistent with progression of multifocal hepatocellular carcinoma. There is evidence  of prior ablation in hepatic segments VII and VIII. 2. No evidence of metastatic disease in the abdomen.  Electronically Signed   By: Eddie Candle M.D.   On: 01/27/2020 15:51   ASSESSMENT & PLAN:   75 y.o. male with  #1 Monoclonal gammopathy of undetermined significance.  M protein 0.2 mg/dL immunofixation showing IgG monoclonal protein with kappa light chain specificity. SPEP 02/2016 -  0.3g/dl  No overt anemia.  No overt hypercalcemia. Stable CKD creatinine 1.6  #2 bone lucencies in the right radial mid midshaft and left humeral head. PET/CT did not show any hypermetabolic bone lesions. MRI left shoulder showed no evidence of metastatic disease or multiple myeloma. The x-ray findings appear to be areas of mild demineralization. Patient was seen by orthopedics and no additional recommendations were given.  Plan -Patient's anemia is stable. No significant change in renal function. No new bone pains. No hypercalcemia . No overall no overt evidence of progression of multiple myeloma. -Myeloma labs from today show stable M protein @ 0.2g/dl with no evidence of progression- consistent with MGUS.  #3 Hepatocellular carcinoma treated with TACE and percutaneous thermal ablation by interventional radiology.   Last MRI on 08/06/2015 showed slight decrease in the size of the ablation defect involving the right lobe of the liver area and no findings to suggest residual or recurrent hepatocellular carcinoma. Mild changes of liver cirrhosis.  01/16/2016 MRI Abdomen showed resolution of previously ablated lesion but a new 1.3 cm lesion was noted which was subsequently ablated under CT guidance by interventional radiology on 01/31/2016. Elevated transaminases on 02/01/2016 were likely related to his ablation. These have since resolved.   06/24/2016 MRI Abdomen showed no evidence of recurrent hepatocellular carcinoma . 12/17/2016 MRI Abdomen shows no evidence of recurrent hepatocellular carcinoma .  10/26/17 MRI Abdomen revealed Motion degraded images. Status post thermal ablation of the segment 8 lesion, without enhancement to suggest residual viable tumor. Prior ablation of a segment 7 lesion, grossly unchanged. No convincing central enhancement  03/14/2019  MRI abdomen w and wo contrast revealed "1. New areas of restricted diffusion, surrounding nodular arterial phase enhancement and delayed  washout surrounding the ablation zone defects anteriorly in segments 8 and 7 consistent with local recurrence of hepatocellular carcinoma. 2. The peripheral ablation zone defect laterally in segment 8 is unchanged. 3. No extrahepatic tumor identified."  10/02/2019 MRI Abdomen ([id number]) revealed "1. Progressive multifocal hepatocellular carcinoma, especially anteriorly around the ablated lesion in segments 4B and 8. There are additional new lesions in segments 2 and 3. No evidence of extrahepatic metastatic disease."  01/26/2020 MRI Abdomen ([id number]) revealed "1. Interval enlargement of multiple hepatic masses as detailed above, consistent with progression of multifocal hepatocellular carcinoma. There is evidence of prior ablation in hepatic segments VII and VIII. 2. No evidence of metastatic disease in the abdomen."  #4 Hepatitis C genotype 1A status post treatment with Viekira and Ribavarin for 24 weeks.   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    PLAN:  -Discussed pt labwork, 01/10/2021; AFP decreased very well, blood counts stable, chemistries show stable CKD. -Advised pt the decrease in AFP tumor marker is quite reassuring and is a very good sign.  -Recommended pt continue to f/u w PCP regarding blood sugars management. -Advised pt that Lenvima can elevate BP. Recommended pt monitor this first thing in the morning daily. -Advised pt that Lenvima can increase skin sensitivity. Recommended proper protection and sunscreen. -Continue Lenvima at  this time. The pt has no residual toxicities from the Y-90 treatment.  -Recommend pt try to increase water intake to 48-64 oz daily. -Advised pt he can gradually increase Lantus if sugars consisently above 180. -Will get repeat MRI Abdomen wo contrast (due to kidney numbers) in 5 weeks. -Will see back in 6 weeks with labs.   FOLLOW UP: MRI abdomen Without contrast in 1  month RTC with Dr Irene Limbo with labs in 6 weeks    The total time spent in the appointment was 20 minutes and more than 50% was on counseling and direct patient cares.    All of the patient's questions were answered with apparent satisfaction. The patient knows to call the clinic with any problems, questions or concerns.   [name] MD [place] AAHIVMS [facility] Hematology/Oncology Physician [facility]  (Office):       [phone] (Work cell):  [phone] (Fax):           [phone]   I, Aaron [name], am acting as scribe for Dr. [name], MD.    I have reviewed the above documentation for accuracy and completeness, and I agree with the above. Brunetta Genera MD

## 2021-01-11 LAB — AFP TUMOR MARKER: AFP, Serum, Tumor Marker: 13.8 ng/mL — ABNORMAL HIGH (ref 0.0–8.4)

## 2021-01-13 ENCOUNTER — Telehealth: Payer: Self-pay | Admitting: Hematology

## 2021-01-13 ENCOUNTER — Inpatient Hospital Stay (HOSPITAL_BASED_OUTPATIENT_CLINIC_OR_DEPARTMENT_OTHER): Payer: Medicare PPO | Admitting: Hematology

## 2021-01-13 DIAGNOSIS — C220 Liver cell carcinoma: Secondary | ICD-10-CM

## 2021-01-13 NOTE — Telephone Encounter (Signed)
Left message with follow-up appointment per 5/23 los. Gave option to call back to reschedule if needed.

## 2021-01-14 ENCOUNTER — Other Ambulatory Visit (HOSPITAL_COMMUNITY): Payer: Self-pay

## 2021-01-15 ENCOUNTER — Other Ambulatory Visit (HOSPITAL_COMMUNITY): Payer: Self-pay

## 2021-01-16 ENCOUNTER — Other Ambulatory Visit (HOSPITAL_COMMUNITY): Payer: Self-pay

## 2021-01-17 ENCOUNTER — Other Ambulatory Visit (HOSPITAL_COMMUNITY): Payer: Self-pay

## 2021-01-20 ENCOUNTER — Encounter: Payer: Self-pay | Admitting: Hematology

## 2021-01-22 DIAGNOSIS — Z0000 Encounter for general adult medical examination without abnormal findings: Secondary | ICD-10-CM | POA: Diagnosis not present

## 2021-01-22 DIAGNOSIS — E119 Type 2 diabetes mellitus without complications: Secondary | ICD-10-CM | POA: Diagnosis not present

## 2021-01-22 DIAGNOSIS — R946 Abnormal results of thyroid function studies: Secondary | ICD-10-CM | POA: Diagnosis not present

## 2021-01-22 DIAGNOSIS — E7800 Pure hypercholesterolemia, unspecified: Secondary | ICD-10-CM | POA: Diagnosis not present

## 2021-01-22 DIAGNOSIS — Z125 Encounter for screening for malignant neoplasm of prostate: Secondary | ICD-10-CM | POA: Diagnosis not present

## 2021-01-28 ENCOUNTER — Other Ambulatory Visit (HOSPITAL_BASED_OUTPATIENT_CLINIC_OR_DEPARTMENT_OTHER): Payer: Self-pay

## 2021-01-30 ENCOUNTER — Other Ambulatory Visit: Payer: Self-pay | Admitting: Hematology

## 2021-02-10 ENCOUNTER — Other Ambulatory Visit (HOSPITAL_COMMUNITY): Payer: Self-pay

## 2021-02-13 ENCOUNTER — Other Ambulatory Visit: Payer: Self-pay | Admitting: Diagnostic Radiology

## 2021-02-13 DIAGNOSIS — C220 Liver cell carcinoma: Secondary | ICD-10-CM

## 2021-02-17 ENCOUNTER — Other Ambulatory Visit (HOSPITAL_COMMUNITY): Payer: Self-pay

## 2021-02-18 ENCOUNTER — Other Ambulatory Visit (HOSPITAL_COMMUNITY): Payer: Self-pay

## 2021-02-20 ENCOUNTER — Other Ambulatory Visit: Payer: Self-pay

## 2021-02-20 ENCOUNTER — Ambulatory Visit (HOSPITAL_COMMUNITY)
Admission: RE | Admit: 2021-02-20 | Discharge: 2021-02-20 | Disposition: A | Discharge status: Home / Self Care | Payer: Medicare PPO | Source: Ambulatory Visit | Attending: Hematology | Admitting: Hematology

## 2021-02-20 DIAGNOSIS — C220 Liver cell carcinoma: Secondary | ICD-10-CM | POA: Diagnosis present

## 2021-02-21 ENCOUNTER — Other Ambulatory Visit (HOSPITAL_COMMUNITY): Payer: Self-pay

## 2021-02-25 ENCOUNTER — Other Ambulatory Visit (HOSPITAL_COMMUNITY): Payer: Self-pay

## 2021-02-25 NOTE — Progress Notes (Signed)
HEMATOLOGY/ONCOLOGY CLINIC NOTE  Date of Service: 02/26/2021  Patient Care Team: Jani Gravel, MD as PCP - General (Internal Medicine)  CHIEF COMPLAINTS/PURPOSE OF CONSULTATION:  F/u for Aaron [name] on Lenvatinib  HISTORY OF PRESENTING ILLNESS:   Aaron [name] is a wonderful 75 y.o. male who has been referred to [place] by Dr Jani Gravel for evaluation and management of monoclonal gammopathy of undetermined significance.  Patient has history of hypertension, diabetes, dyslipidemia, hepatocellular carcinoma (rx with TACE and percutaneous thermal ablation), hepatitis C status post treatment, iron deficiency anemia in 2014 treated with oral iron.  Patient had an SPEP done by his primary care physician on 10/04/2015 that showed an M spike of 0.3 g/dL. It was presumably will be done due to his complaints of fatigue. He has had no significant anemia. No new bone pains. No fevers/chills/drenching night sweats.  No new anemia..  Outside labs show hemoglobin of 12.4 with microcytosis with an MCV of 70.5, normal WBC count of 5.1k.  No evidence of hypercalcemia or significant renal failure on his outside labs.   INTERVAL HISTORY:  Aaron [name] is here for his scheduled follow-up for [facility]. The patient's last visit with [place] was on 01/13/2021. The pt reports that he is doing well overall.  The pt reports no new issues or symptoms since the last visit. He continues to tolerate the Lenvima well. He has not had it for five days due to not receiving it when he was supposed to. He has been more active by working in his shop and mowing his lawn. The patient has not seen his nephrologist yet due to transportation issues.   The pt has had MR Abd wo contrast ([id number]) on 02/20/2021, which revealed "1. The more homogeneously T2 hyperintense liver lesions have progressed in the interim, demonstrating moderate enlargement and compatible with active malignancy. However, there several T2 hypointense or heterogeneous lesions  likely reflecting previously treated lesions which demonstrate either stability or mild reduction in size. No new liver lesions are identified."  Lab results today 02/26/2021 of CBC w/diff and CMP is as follows: all values are WNL except for Hgb of 12.0, MCV of 74.9, MCH of 23.0, RDW of 16.0, Glucose of 113, BUN of 41, Creatinine of 2.36, GFR est of 28. 02/26/2021 AFP Tumor marker 22.4  On review of systems, pt reports weight gain and denies fevers, chills, night sweats, changes in bowel habits, abdominal pain, neuropathy, skin peeling, changes in color of urine, and any other symptoms.  MEDICAL HISTORY:  Past Medical History:  Diagnosis Date   Arthritis    Diabetes mellitus without complication ([place])    type II    Elevated liver enzymes    Fatty liver    Hepatitis C    genotype 1A status post treatment with Linzie Collin and Ribavarin for 24 weeks   Hepatocellular carcinoma ([place]) 01/30/2014   Path   History of colon polyps    Hyperlipidemia    Hypertension    Iron deficiency anemia    hx of    MGUS (monoclonal gammopathy of unknown significance)    Obesity Hepatocellular carcinoma treated with TACE and percutaneous thermal ablation by interventional radiology. Last MRI on 08/06/2015 showed slight decrease in the size of the ablation defect involving the right lobe of the liver area and no findings to suggest residual or recurrent hepatocellular carcinoma. Mild changes of liver cirrhosis. MRI Abd 06/24/2016- no evidence of recurrent HCC   Hepatitis C genotype 1A status post treatment with Linzie Collin  and Ribavarin for 24 weeks.  Monoclonal gammopathy of undetermined significance.  SURGICAL HISTORY:  Status post microwave ablation[October 2015] of liver lesion and TACE [July 2015] for [place]. EGD and colonoscopy in 2016   SOCIAL HISTORY: Social History   Socioeconomic History   Marital status: Married    Spouse name: Not on file   Number of children: Not on file   Years of education: Not  on file   Highest education level: Not on file  Occupational History   Not on file  Tobacco Use   Smoking status: Former    Pack years: 0.00    Types: Cigarettes    Quit date: 01/30/1994    Years since quitting: 27.0   Smokeless tobacco: Never   Tobacco comments:    stopped 25 yrs ago  Vaping Use   Vaping Use: Never used  Substance and Sexual Activity   Alcohol use: No    Comment: stopped 30 years ago    Drug use: No   Sexual activity: Not Currently  Other Topics Concern   Not on file  Social History Narrative   Not on file   Social Determinants of Health   Financial Resource Strain: Not on file  Food Insecurity: Not on file  Transportation Needs: Not on file  Physical Activity: Not on file  Stress: Not on file  Social Connections: Not on file  Intimate Partner Violence: Not on file  Former smoker and smoked 1 pack per day for about 20 years starting at age 42 quit 82 years ago.  FAMILY HISTORY: No family history on file.  ALLERGIES:  has No Known Allergies.  MEDICATIONS:  Current Outpatient Medications  Medication Sig Dispense Refill   glipiZIDE (GLUCOTROL XL) 2.5 MG 24 hr tablet Take 2.5 mg by mouth daily with breakfast.     simvastatin (ZOCOR) 20 MG tablet Take 20 mg by mouth daily.     ACCU-CHEK AVIVA PLUS test strip      Accu-Chek Softclix Lancets lancets      ALPRAZolam (XANAX) 0.5 MG tablet Take 1 tablet (0.5 mg total) by mouth as directed. Take 2 tablets (1.0 mg total) prior to MR scan.  Make take an additional 0.5 mg po if needed. (Patient not taking: No sig reported) 3 tablet 0   amLODipine (NORVASC) 10 MG tablet Take 1 tablet (10 mg total) by mouth daily. 30 tablet 5   aspirin EC 81 MG tablet Take 81 mg by mouth every morning.      cholecalciferol (VITAMIN D) 1000 UNITS tablet Take 1,000 Units by mouth every morning.      Insulin Glargine (LANTUS SOLOSTAR) 100 UNIT/ML Solostar Pen Inject 32 Units into the skin daily.     lenvatinib 12 mg daily dose  (LENVIMA) 3 x 4 MG capsule TAKE 3 CAPSULES (12MG) BY MOUTH DAILY. 90 each 2   lisinopril-hydrochlorothiazide (ZESTORETIC) 20-12.5 MG tablet TAKE 2 TABLETS BY MOUTH DAILY WITH BREAKFAST. 60 tablet 0   metFORMIN (GLUCOPHAGE) 1000 MG tablet Take 1,000 mg by mouth 2 (two) times daily with a meal. (Patient not taking: Reported on 02/26/2021)     Multiple Vitamin (MULTIVITAMIN WITH MINERALS) TABS tablet Take 1 tablet daily by mouth.     omeprazole (PRILOSEC) 20 MG capsule Take 1 capsule (20 mg total) by mouth daily. (Patient not taking: No sig reported) 30 capsule 0   oxyCODONE (OXY IR/ROXICODONE) 5 MG immediate release tablet Take 1 tablet (5 mg total) by mouth every 6 (six) hours as needed  for severe pain. (Patient not taking: Reported on 02/26/2021) 15 tablet 0   PAZEO 0.7 % SOLN Place 1 drop into both eyes every morning.  (Patient not taking: Reported on 02/26/2021)  3   Current Facility-Administered Medications  Medication Dose Route Frequency Provider Last Rate Last Admin   oxyCODONE (Oxy IR/ROXICODONE) immediate release tablet 5 mg  5 mg Oral Q6H PRN Omohundro, Jennifer C, NP        REVIEW OF SYSTEMS:   10 Point review of Systems was done is negative except as noted above.   PHYSICAL EXAMINATION: ECOG FS:1 - Symptomatic but completely ambulatory  Vitals:   02/26/21 1537  BP: (!) 143/75  Pulse: 70  Resp: 18  Temp: 98.2 F (36.8 C)  SpO2: 100%   Wt Readings from Last 3 Encounters:  02/26/21 255 lb 14.4 oz (116.1 kg)  11/20/20 246 lb 6.4 oz (111.8 kg)  10/23/20 243 lb (110.2 kg)   Body mass index is 34.71 kg/m.     GENERAL:alert, in no acute distress and comfortable SKIN: no acute rashes, no significant lesions EYES: conjunctiva are pink and non-injected, sclera anicteric OROPHARYNX: MMM, no exudates, no oropharyngeal erythema or ulceration NECK: supple, no JVD LYMPH:  no palpable lymphadenopathy in the cervical, axillary or inguinal regions LUNGS: clear to auscultation b/l with  normal respiratory effort HEART: regular rate & rhythm ABDOMEN:  normoactive bowel sounds , non tender, not distended. Extremity: no pedal edema PSYCH: alert & oriented x 3 with fluent speech NEURO: no focal motor/sensory deficits  LABORATORY DATA:  I have reviewed the data as listed  CBC Latest Ref Rng & Units 02/26/2021 01/10/2021 12/13/2020  WBC 4.0 - 10.5 K/uL 4.2 3.7(L) 4.3  Hemoglobin 13.0 - 17.0 g/dL 12.0(L) 12.0(L) 11.3(L)  Hematocrit 39.0 - 52.0 % 39.0 38.9(L) 36.8(L)  Platelets 150 - 400 K/uL 184 202 225    CBC    Component Value Date/Time   WBC 4.2 02/26/2021 1459   RBC 5.21 02/26/2021 1459   HGB 12.0 (L) 02/26/2021 1459   HGB 12.0 (L) 01/10/2021 0919   HGB 11.5 (L) 03/22/2017 1402   HCT 39.0 02/26/2021 1459   HCT 37.0 (L) 03/22/2017 1402   PLT 184 02/26/2021 1459   PLT 202 01/10/2021 0919   PLT 262 03/22/2017 1402   MCV 74.9 (L) 02/26/2021 1459   MCV 72.3 (L) 03/22/2017 1402   MCH 23.0 (L) 02/26/2021 1459   MCHC 30.8 02/26/2021 1459   RDW 16.0 (H) 02/26/2021 1459   RDW 15.6 (H) 03/22/2017 1402   LYMPHSABS 0.9 02/26/2021 1459   LYMPHSABS 1.8 03/22/2017 1402   MONOABS 0.4 02/26/2021 1459   MONOABS 0.4 03/22/2017 1402   EOSABS 0.1 02/26/2021 1459   EOSABS 0.1 03/22/2017 1402   BASOSABS 0.0 02/26/2021 1459   BASOSABS 0.0 03/22/2017 1402    . CMP Latest Ref Rng & Units 02/26/2021 01/10/2021 12/13/2020  Glucose 70 - 99 mg/dL 113(H) 197(H) 124(H)  BUN 8 - 23 mg/dL 41(H) 32(H) 27(H)  Creatinine 0.61 - 1.24 mg/dL 2.36(H) 2.17(H) 2.09(H)  Sodium 135 - 145 mmol/L 138 139 139  Potassium 3.5 - 5.1 mmol/L 4.5 4.9 4.4  Chloride 98 - 111 mmol/L 106 101 102  CO2 22 - 32 mmol/L 24 28 27  Calcium 8.9 - 10.3 mg/dL 9.5 9.4 9.4  Total Protein 6.5 - 8.1 g/dL 7.7 7.9 8.1  Total Bilirubin 0.3 - 1.2 mg/dL 0.4 0.4 0.4  Alkaline Phos 38 - 126 U/L 55 79 67  AST   15 - 41 U/L 30 31 42(H)  ALT 0 - 44 U/L _0 06/21/2019 MR ABDOMEN WWO CONTRAST (Accession  [id number])     IFE 1  Comment    Comments: Immunofixation shows IgG monoclonal protein with kappa light chain  specificity.           RADIOGRAPHIC STUDIES:  MRI abd w and wo contrast: 12/17/2016: IMPRESSION: 1. Postprocedural changes of thermal ablation again noted in the right lobe of the liver, without definitive evidence to suggest local recurrence of disease. No new hepatic lesions are noted. 2. Additional incidental findings, as above.     Electronically Signed   By: Vinnie Langton M.D.   On: 12/17/2016 09:42    MRI abd w and wo contrast 01/26/2020 IMPRESSION: 1. Interval enlargement of multiple hepatic masses as detailed above, consistent with progression of multifocal hepatocellular carcinoma. There is evidence of prior ablation in hepatic segments VII and VIII. 2. No evidence of metastatic disease in the abdomen.   Electronically Signed   By: Eddie Candle M.D.   On: 01/27/2020 15:51   ASSESSMENT & PLAN:   75 y.o. male with  #1 Monoclonal gammopathy of undetermined significance.  M protein 0.2 mg/dL immunofixation showing IgG monoclonal protein with kappa light chain specificity. SPEP 02/2016 - 0.3g/dl  No overt anemia.  No overt hypercalcemia. Stable CKD creatinine 1.6  #2 bone lucencies in the right radial mid midshaft and left humeral head. PET/CT did not show any hypermetabolic bone lesions. MRI left shoulder showed no evidence of metastatic disease or multiple myeloma. The x-ray findings appear to be areas of mild demineralization. Patient was seen by orthopedics and no additional recommendations were given.  Plan -Patient's anemia is stable. No significant change in renal function. No new bone pains. No hypercalcemia . No overall no overt evidence of progression of multiple myeloma. -Myeloma labs from today show stable M protein @ 0.2g/dl with no evidence of progression- consistent with MGUS.  #3 Hepatocellular carcinoma treated with TACE and  percutaneous thermal ablation by interventional radiology.   Last MRI on 08/06/2015 showed slight decrease in the size of the ablation defect involving the right lobe of the liver area and no findings to suggest residual or recurrent hepatocellular carcinoma. Mild changes of liver cirrhosis.  01/16/2016 MRI Abdomen showed resolution of previously ablated lesion but a new 1.3 cm lesion was noted which was subsequently ablated under CT guidance by interventional radiology on 01/31/2016. Elevated transaminases on 02/01/2016 were likely related to his ablation. These have since resolved.   06/24/2016 MRI Abdomen showed no evidence of recurrent hepatocellular carcinoma . 12/17/2016 MRI Abdomen shows no evidence of recurrent hepatocellular carcinoma .  10/26/17 MRI Abdomen revealed Motion degraded images. Status post thermal ablation of the segment 8 lesion, without enhancement to suggest residual viable tumor. Prior ablation of a segment 7 lesion, grossly unchanged. No convincing central enhancement  03/14/2019  MRI abdomen w and wo contrast revealed "1. New areas of restricted diffusion, surrounding nodular arterial phase enhancement and delayed washout surrounding the ablation zone defects anteriorly in segments 8 and 7 consistent with local recurrence of hepatocellular carcinoma. 2. The peripheral ablation zone defect laterally in segment 8 is unchanged. 3. No extrahepatic tumor identified."  10/02/2019 MRI Abdomen ([id number]) revealed "1. Progressive multifocal hepatocellular carcinoma, especially anteriorly around the ablated lesion in segments 4B and 8. There are additional new lesions in segments 2 and 3. No evidence of extrahepatic metastatic disease."  01/26/2020 MRI  Abdomen ([id number]) revealed "1. Interval enlargement of multiple hepatic masses as detailed above, consistent with progression of multifocal hepatocellular carcinoma. There is evidence of prior ablation in hepatic segments VII and VIII.  2. No evidence of metastatic disease in the abdomen."  #4 Hepatitis C genotype 1A status post treatment with [place] and Ribavarin for 24 weeks.   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    PLAN:  -Discussed pt labwork, 02/26/2021; counts stable, chemistries show mild dehydration -Discussed pt MR Abd wo contrast on 02/20/2021; few spots that are stable and smaller. A few spots have increased in size (3 cm to 3.5 cm, 1.3 cm to 1.9 cm). No completely new spots. -seen by Dr Henn - recommended contrast enhanced US to better define lesion and consideration for percutaneous ablation of dominant lesions --Recommended pt avoid drinking grapefruit juice. Cranberry juice is okay if pt desires to drink that. -Continue Lenvima at this time.  -Recommended again pt try to increase water intake to 48-64 oz daily. -Will see back in 6 weeks with labs.   FOLLOW UP: RTC with Dr Kale with labs in 6 weeks   The total time spent in the appointment was 20 minutes and more than 50% was on counseling and direct patient cares.    All of the patient's questions were answered with apparent satisfaction. The patient knows to call the clinic with any problems, questions or concerns.   Gautam Kale MD MS AAHIVMS [facility] Hematology/Oncology Physician [facility]  (Office):       [phone] (Work cell):  [phone] (Fax):           [phone]   I, Ross Judd, am acting as scribe for Dr. Gautam Kale, MD.  .I have reviewed the above documentation for accuracy and completeness, and I agree with the above. .Gautam Kishore Kale MD        

## 2021-02-26 ENCOUNTER — Other Ambulatory Visit: Payer: Self-pay

## 2021-02-26 ENCOUNTER — Inpatient Hospital Stay: Payer: Medicare PPO | Attending: Hematology | Admitting: Hematology

## 2021-02-26 ENCOUNTER — Inpatient Hospital Stay: Payer: Medicare PPO

## 2021-02-26 ENCOUNTER — Other Ambulatory Visit (HOSPITAL_COMMUNITY): Payer: Self-pay

## 2021-02-26 VITALS — BP 143/75 | HR 70 | Temp 98.2°F | Resp 18 | Wt 255.9 lb

## 2021-02-26 DIAGNOSIS — C220 Liver cell carcinoma: Secondary | ICD-10-CM

## 2021-02-26 DIAGNOSIS — D509 Iron deficiency anemia, unspecified: Secondary | ICD-10-CM | POA: Insufficient documentation

## 2021-02-26 DIAGNOSIS — D472 Monoclonal gammopathy: Secondary | ICD-10-CM | POA: Diagnosis not present

## 2021-02-26 LAB — CMP (CANCER CENTER ONLY)
ALT: 21 U/L (ref 0–44)
AST: 30 U/L (ref 15–41)
Albumin: 3.7 g/dL (ref 3.5–5.0)
Alkaline Phosphatase: 55 U/L (ref 38–126)
Anion gap: 8 (ref 5–15)
BUN: 41 mg/dL — ABNORMAL HIGH (ref 8–23)
CO2: 24 mmol/L (ref 22–32)
Calcium: 9.5 mg/dL (ref 8.9–10.3)
Chloride: 106 mmol/L (ref 98–111)
Creatinine: 2.36 mg/dL — ABNORMAL HIGH (ref 0.61–1.24)
GFR, Estimated: 28 mL/min — ABNORMAL LOW (ref >60)
Glucose, Bld: 113 mg/dL — ABNORMAL HIGH (ref 70–99)
Potassium: 4.5 mmol/L (ref 3.5–5.1)
Sodium: 138 mmol/L (ref 135–145)
Total Bilirubin: 0.4 mg/dL (ref 0.3–1.2)
Total Protein: 7.7 g/dL (ref 6.5–8.1)

## 2021-02-26 LAB — CBC WITH DIFFERENTIAL/PLATELET
Abs Immature Granulocytes: 0.01 10*3/uL (ref 0.00–0.07)
Basophils Absolute: 0 10*3/uL (ref 0.0–0.1)
Basophils Relative: 1 %
Eosinophils Absolute: 0.1 10*3/uL (ref 0.0–0.5)
Eosinophils Relative: 2 %
HCT: 39 % (ref 39.0–52.0)
Hemoglobin: 12 g/dL — ABNORMAL LOW (ref 13.0–17.0)
Immature Granulocytes: 0 %
Lymphocytes Relative: 23 %
Lymphs Abs: 0.9 10*3/uL (ref 0.7–4.0)
MCH: 23 pg — ABNORMAL LOW (ref 26.0–34.0)
MCHC: 30.8 g/dL (ref 30.0–36.0)
MCV: 74.9 fL — ABNORMAL LOW (ref 80.0–100.0)
Monocytes Absolute: 0.4 10*3/uL (ref 0.1–1.0)
Monocytes Relative: 10 %
Neutro Abs: 2.7 10*3/uL (ref 1.7–7.7)
Neutrophils Relative %: 64 %
Platelets: 184 10*3/uL (ref 150–400)
RBC: 5.21 MIL/uL (ref 4.22–5.81)
RDW: 16 % — ABNORMAL HIGH (ref 11.5–15.5)
WBC: 4.2 10*3/uL (ref 4.0–10.5)
nRBC: 0 % (ref 0.0–0.2)

## 2021-02-27 ENCOUNTER — Ambulatory Visit
Admission: RE | Admit: 2021-02-27 | Discharge: 2021-02-27 | Disposition: A | Discharge status: Home / Self Care | Payer: Medicare PPO | Source: Ambulatory Visit | Attending: Diagnostic Radiology | Admitting: Diagnostic Radiology

## 2021-02-27 ENCOUNTER — Encounter: Payer: Self-pay | Admitting: *Deleted

## 2021-02-27 ENCOUNTER — Other Ambulatory Visit: Payer: Self-pay | Admitting: Diagnostic Radiology

## 2021-02-27 DIAGNOSIS — C220 Liver cell carcinoma: Secondary | ICD-10-CM

## 2021-02-27 HISTORY — PX: IR RADIOLOGIST EVAL & MGMT: IMG5224

## 2021-02-27 LAB — AFP TUMOR MARKER: AFP, Serum, Tumor Marker: 22.4 ng/mL — ABNORMAL HIGH (ref 0.0–8.4)

## 2021-02-27 NOTE — Progress Notes (Signed)
Chief Complaint: Patient was consulted remotely today (TeleHealth) for follow-up hepatocellular carcinoma.   Referring Physician(s): [name]  History of Present Illness: 75 y.o. male with multifocal hepatocellular carcinoma who was undergone both systemic and liver directed therapies (including DEB-TACE, bland embolization, thermal ablation and radioembolization).  Most recent liver directed therapy was transcatheter radioembolization to the lateral left hepatic lobe on 11/19/2020.  Patient has had 3 different Y 90 radioembolization's with treatment to the right hepatic lobe, medial left hepatic lobe and lateral left hepatic lobe.  Patient had a good chemical response to the Y 90 treatments with decreases in his AFP level.  AFP level went from 744 (09/19/20) down to 13.8 (01/10/21).  Repeat AFP on 02/26/2021 was 22.4.  Patient also underwent MRI of the liver follow-up on 02/20/2021.  Patient just recently saw Dr. Irene Limbo.  Patient is still receiving systemic therapy with lenvatinib 36 mg daily.  Patient has no new complaints.  His energy level and appetite are about the same.  He is still able to do outside work.  Patient is a diabetic and has had problems with renal insufficiency.  Patient has missed appointments with nephrology but says that he has an upcoming appointment.  Past Medical History:  Diagnosis Date   Arthritis    Diabetes mellitus without complication ([place])    type II    Elevated liver enzymes    Fatty liver    Hepatitis C    genotype 1A status post treatment with Linzie Collin and Ribavarin for 24 weeks   Hepatocellular carcinoma (HCC) 01/30/2014   Path   History of colon polyps    Hyperlipidemia    Hypertension    Iron deficiency anemia    hx of    MGUS (monoclonal gammopathy of unknown significance)     Past Surgical History:  Procedure Laterality Date   COLONOSCOPY     ESOPHAGOGASTRODUODENOSCOPY  2016   IR ANGIOGRAM SELECTIVE EACH ADDITIONAL VESSEL  04/04/2019   IR  ANGIOGRAM SELECTIVE EACH ADDITIONAL VESSEL  04/04/2019   IR ANGIOGRAM SELECTIVE EACH ADDITIONAL VESSEL  04/04/2019   IR ANGIOGRAM SELECTIVE EACH ADDITIONAL VESSEL  04/04/2019   IR ANGIOGRAM SELECTIVE EACH ADDITIONAL VESSEL  04/04/2019   IR ANGIOGRAM SELECTIVE EACH ADDITIONAL VESSEL  04/04/2019   IR ANGIOGRAM SELECTIVE EACH ADDITIONAL VESSEL  04/04/2019   IR ANGIOGRAM SELECTIVE EACH ADDITIONAL VESSEL  05/16/2019   IR ANGIOGRAM SELECTIVE EACH ADDITIONAL VESSEL  05/16/2019   IR ANGIOGRAM SELECTIVE EACH ADDITIONAL VESSEL  05/16/2019   IR ANGIOGRAM SELECTIVE EACH ADDITIONAL VESSEL  02/14/2020   IR ANGIOGRAM SELECTIVE EACH ADDITIONAL VESSEL  02/14/2020   IR ANGIOGRAM SELECTIVE EACH ADDITIONAL VESSEL  02/14/2020   IR ANGIOGRAM SELECTIVE EACH ADDITIONAL VESSEL  02/14/2020   IR ANGIOGRAM SELECTIVE EACH ADDITIONAL VESSEL  03/01/2020   IR ANGIOGRAM SELECTIVE EACH ADDITIONAL VESSEL  03/01/2020   IR ANGIOGRAM SELECTIVE EACH ADDITIONAL VESSEL  03/01/2020   IR ANGIOGRAM SELECTIVE EACH ADDITIONAL VESSEL  03/01/2020   IR ANGIOGRAM SELECTIVE EACH ADDITIONAL VESSEL  03/01/2020   IR ANGIOGRAM SELECTIVE EACH ADDITIONAL VESSEL  06/18/2020   IR ANGIOGRAM SELECTIVE EACH ADDITIONAL VESSEL  06/18/2020   IR ANGIOGRAM SELECTIVE EACH ADDITIONAL VESSEL  11/19/2020   IR ANGIOGRAM SELECTIVE EACH ADDITIONAL VESSEL  11/19/2020   IR ANGIOGRAM VISCERAL SELECTIVE  04/04/2019   IR ANGIOGRAM VISCERAL SELECTIVE  05/16/2019   IR ANGIOGRAM VISCERAL SELECTIVE  02/14/2020   IR ANGIOGRAM VISCERAL SELECTIVE  02/14/2020   IR ANGIOGRAM VISCERAL SELECTIVE  03/01/2020  IR ANGIOGRAM VISCERAL SELECTIVE  06/18/2020   IR ANGIOGRAM VISCERAL SELECTIVE  11/19/2020   IR EMBO ARTERIAL NOT HEMORR HEMANG INC GUIDE ROADMAPPING  02/14/2020   IR EMBO TUMOR ORGAN ISCHEMIA INFARCT INC GUIDE ROADMAPPING  04/04/2019   IR EMBO TUMOR ORGAN ISCHEMIA INFARCT INC GUIDE ROADMAPPING  05/16/2019   IR EMBO TUMOR ORGAN ISCHEMIA INFARCT INC GUIDE ROADMAPPING  03/01/2020   IR EMBO TUMOR ORGAN  ISCHEMIA INFARCT INC GUIDE ROADMAPPING  06/18/2020   IR EMBO TUMOR ORGAN ISCHEMIA INFARCT INC GUIDE ROADMAPPING  11/19/2020   IR GENERIC HISTORICAL  02/26/2016   IR RADIOLOGIST EVAL & MGMT 02/26/2016 Markus Daft, MD [facility]   IR GENERIC HISTORICAL  01/16/2016   IR RADIOLOGIST EVAL & MGMT 01/16/2016 Markus Daft, MD [facility]   IR GENERIC HISTORICAL  06/24/2016   IR RADIOLOGIST EVAL & MGMT 06/24/2016 Aletta Edouard, MD [facility]   IR RADIOLOGIST EVAL & MGMT  12/17/2016   IR RADIOLOGIST EVAL & MGMT  06/16/2017   IR RADIOLOGIST EVAL & MGMT  09/08/2017   IR RADIOLOGIST EVAL & MGMT  10/26/2017   IR RADIOLOGIST EVAL & MGMT  05/12/2018   IR RADIOLOGIST EVAL & MGMT  07/26/2018   IR RADIOLOGIST EVAL & MGMT  03/22/2019   IR RADIOLOGIST EVAL & MGMT  04/11/2019   IR RADIOLOGIST EVAL & MGMT  06/28/2019   IR RADIOLOGIST EVAL & MGMT  10/10/2019   IR RADIOLOGIST EVAL & MGMT  05/21/2020   IR RADIOLOGIST EVAL & MGMT  06/12/2020   IR RADIOLOGIST EVAL & MGMT  10/24/2020   IR RADIOLOGIST EVAL & MGMT  12/11/2020   IR RADIOLOGIST EVAL & MGMT  02/27/2021   IR US GUIDE VASC ACCESS LEFT  05/16/2019   IR US GUIDE VASC ACCESS RIGHT  04/04/2019   IR US GUIDE VASC ACCESS RIGHT  02/14/2020   IR US GUIDE VASC ACCESS RIGHT  03/01/2020   IR US GUIDE VASC ACCESS RIGHT  06/18/2020   IR US GUIDE VASC ACCESS RIGHT  11/19/2020   POLYPECTOMY     RADIOFREQUENCY ABLATION N/A 07/21/2017   Procedure: CT MICROWAVE THERMAL ABLATION-LIVER;  Surgeon: Markus Daft, MD;  Location: [facility];  Service: Anesthesiology;  Laterality: N/A;   RADIOFREQUENCY ABLATION N/A 06/29/2018   Procedure: CT MICROWAVE THERMAL ABLATION;  Surgeon: Markus Daft, MD;  Location: [facility];  Service: Anesthesiology;  Laterality: N/A;    Allergies: Patient has no known allergies.  Medications: Prior to Admission medications   Medication Sig Start Date End Date Taking? Authorizing Provider  ACCU-CHEK AVIVA PLUS test strip  11/19/19   [provider]  Accu-Chek  Softclix Lancets lancets  09/12/19   [provider]  ALPRAZolam Duanne Moron) 0.5 MG tablet Take 1 tablet (0.5 mg total) by mouth as directed. Take 2 tablets (1.0 mg total) prior to MR scan.  Make take an additional 0.5 mg po if needed. Patient not taking: No sig reported 10/26/17   Markus Daft, MD  amLODipine (NORVASC) 10 MG tablet Take 1 tablet (10 mg total) by mouth daily. 11/20/20   Brunetta Genera, MD  aspirin EC 81 MG tablet Take 81 mg by mouth every morning.     [provider]  cholecalciferol (VITAMIN D) 1000 UNITS tablet Take 1,000 Units by mouth every morning.     [provider]  glipiZIDE (GLUCOTROL XL) 2.5 MG 24 hr tablet Take 2.5 mg by mouth daily with breakfast.    [provider]  Insulin  Glargine (LANTUS SOLOSTAR) 100 UNIT/ML Solostar Pen Inject 32 Units into the skin daily.    [provider]  lenvatinib 12 mg daily dose (LENVIMA) 3 x 4 MG capsule TAKE 3 CAPSULES (12MG ) BY MOUTH DAILY. 12/23/20 12/23/21  Brunetta Genera, MD  lisinopril-hydrochlorothiazide (ZESTORETIC) 20-12.5 MG tablet TAKE 2 TABLETS BY MOUTH DAILY WITH BREAKFAST. 01/30/21   Brunetta Genera, MD  metFORMIN (GLUCOPHAGE) 1000 MG tablet Take 1,000 mg by mouth 2 (two) times daily with a meal. Patient not taking: Reported on 02/26/2021    [provider]  Multiple Vitamin (MULTIVITAMIN WITH MINERALS) TABS tablet Take 1 tablet daily by mouth.    [provider]  omeprazole (PRILOSEC) 20 MG capsule Take 1 capsule (20 mg total) by mouth daily. Patient not taking: No sig reported 06/18/20   Candiss Norse A, PA-C  oxyCODONE (OXY IR/ROXICODONE) 5 MG immediate release tablet Take 1 tablet (5 mg total) by mouth every 6 (six) hours as needed for severe pain. Patient not taking: Reported on 02/26/2021 03/01/20   Docia Barrier, PA  PAZEO 0.7 % SOLN Place 1 drop into both eyes every morning.  Patient not taking: Reported on 02/26/2021 05/09/18   [provider]  simvastatin (ZOCOR) 20 MG tablet Take 20 mg by mouth daily.    [provider]     No family history on file.  Social History   Socioeconomic History   Marital status: Married    Spouse name: Not on file   Number of children: Not on file   Years of education: Not on file   Highest education level: Not on file  Occupational History   Not on file  Tobacco Use   Smoking status: Former    Pack years: 0.00    Types: Cigarettes    Quit date: 01/30/1994    Years since quitting: 27.0   Smokeless tobacco: Never   Tobacco comments:    stopped 25 yrs ago  Vaping Use   Vaping Use: Never used  Substance and Sexual Activity   Alcohol use: No    Comment: stopped 30 years ago    Drug use: No   Sexual activity: Not Currently  Other Topics Concern   Not on file  Social History Narrative   Not on file   Social Determinants of Health   Financial Resource Strain: Not on file  Food Insecurity: Not on file  Transportation Needs: Not on file  Physical Activity: Not on file  Stress: Not on file  Social Connections: Not on file     Review of Systems  Constitutional:  Negative for activity change and unexpected weight change.  Gastrointestinal: Negative.      Physical Exam No direct physical exam was performed   Vital Signs: There were no vitals taken for this visit.  Imaging: MR Abdomen Wo Contrast  Result Date: 02/22/2021 CLINICAL DATA:  Hepatobiliary cancer, chronic kidney disease. EXAM: MRI ABDOMEN WITHOUT CONTRAST TECHNIQUE: Multiplanar multisequence MR imaging was performed without the administration of intravenous contrast. COMPARISON:  10/16/2020 FINDINGS: Lower chest: Unremarkable Hepatobiliary: Gallbladder unremarkable. Common bile duct upper limits of normal at 0.7 cm diameter. Scattered hepatic masses are present in the right and left hepatic lobe. Index lesion centrally in the lateral segment left hepatic lobe 3.6 by 2.8 cm on image 17 series 5,  previously 3.0 by 2.0 cm on 10/16/2020. Index lesion in the right hepatic lobe adjacent to the IVC measures 1.9 by 1.7 cm on image 14 series  5, previously 1.2 by 1.3 cm. The A newly heterogeneous lesion in segment 2 of the liver measures 4.3 by 3.0 cm on image 10 series 5, previously homogeneous and measuring 4.0 by 3.1 cm. The previously T2 hypointense lesion in the dome of segment 4 has actually reduced in size, currently 1.9 by 1.4 cm in formerly 3.2 by 2.3 cm. The peripheral subcapsular lesion in the right hepatic lobe measures 2.5 by 1.8 cm (formerly 2.6 by 1.9 cm) and likewise demonstrates moderate heterogeneity. I not see any definite new lesions. Pancreas:  Unremarkable Spleen:  Unremarkable Adrenals/Urinary Tract: Tiny probable cyst in the left mid kidney. Adrenal glands unremarkable. Stomach/Bowel: Unremarkable Vascular/Lymphatic: Porta hepatis node 0.8 cm in short axis on image 21 series 5, previously the same by my measurements. Other:  No supplemental non-categorized findings. Musculoskeletal: Mild lower lumbar spondylosis and degenerative disc disease. IMPRESSION: 1. The more homogeneously T2 hyperintense liver lesions have progressed in the interim, demonstrating moderate enlargement and compatible with active malignancy. However, there several T2 hypointense or heterogeneous lesions likely reflecting previously treated lesions which demonstrate either stability or mild reduction in size. No new liver lesions are identified. Electronically Signed   By: Van Clines M.D.   On: 02/22/2021 13:44   IR Radiologist Eval & Mgmt  Result Date: 02/27/2021 Please refer to notes tab for details about interventional procedure. (Op Note)   Labs:  CBC: Recent Labs    11/20/20 1412 12/13/20 0801 01/10/21 0919 02/26/21 1459  WBC 9.4 4.3 3.7* 4.2  HGB 10.8* 11.3* 12.0* 12.0*  HCT 35.8* 36.8* 38.9* 39.0  PLT 264 225 202 184    COAGS: Recent Labs    03/01/20 0825 04/19/20 0832 06/18/20 0900  11/19/20 0835  INR 1.0 1.0 1.0 1.0  APTT  --   --  35  --     BMP: Recent Labs    03/01/20 0825 04/19/20 0832 05/08/20 0948 06/18/20 0900 11/20/20 1412 12/13/20 0801 01/10/21 0919 02/26/21 1459  NA 140 136 139   < > 137 139 139 138  K 4.8 4.6 4.8   < > 4.5 4.4 4.9 4.5  CL 102 99 102   < > 102 102 101 106  CO2 25 25 28    < > 24 27 28 24   GLUCOSE 136* 167* 107*   < > 148* 124* 197* 113*  BUN 31* 27* 35*   < > 32* 27* 32* 41*  CALCIUM 9.6 9.6 9.7   < > 8.8* 9.4 9.4 9.5  CREATININE 1.65* 1.64* 2.08*   < > 2.04* 2.09* 2.17* 2.36*  GFRNONAA 41* 41* 31*   < > 34* 33* 31* 28*  GFRAA 47* 47* 36*  --   --   --   --   --    < > = values in this interval not displayed.    LIVER FUNCTION TESTS: Recent Labs    11/20/20 1412 12/13/20 0801 01/10/21 0919 02/26/21 1459  BILITOT 0.4 0.4 0.4 0.4  AST 34 42* 31 30  ALT 22 24 19 21   ALKPHOS 60 67 79 55  PROT 8.0 8.1 7.9 7.7  ALBUMIN 3.7 3.8 3.6 3.7    TUMOR MARKERS: No results for input(s): AFPTM, CEA, CA199, CHROMGRNA in the last 8760 hours.  Assessment and Plan:  75 year old with multifocal hepatocellular carcinoma.  Patient has had a combination of multiple liver directed therapies and he is undergoing systemic therapy at this time.  AFP level significantly decreased after the most recent radioembolization  to the lateral left hepatic lobe.  Recent MRI demonstrates mild enlargement of some lesions, predominantly in the left hepatic lobe.  Unfortunately, the MRI was performed without contrast because of the patient's renal insufficiency.  Overall, I am happy with the way the patient has responded to the Y 90 radioembolizations.  At this point, he has had a significant radiation dose to the liver but he could probably tolerate additional treatment if needed based on his liver function.  No new liver lesions on the most recent MRI so it may be more beneficial to target the residual lesions that have showed mild growth.  Due to patient's  renal sufficiency, I would like to further evaluate the suspicious liver lesions with contrast-enhanced ultrasound.  Depending on the findings with the contrast-enhanced ultrasound, we may be able to target a few of these lesions with percutaneous microwave ablation.  Mr. Risinger is a little frustrated that we continue to need additional liver directed therapy but he is willing to continue with treatments and understands that these liver directed therapies have been beneficial.  Plan for a contrast-enhanced ultrasound of the liver to evaluate the enlarging lesions.    Electronically Signed: Burman Riis 02/27/2021, 12:09 PM   I spent a total of    10 Minutes in remote  clinical consultation, greater than 50% of which was counseling/coordinating care for hepatocellular carcinoma.    Visit type: Audio only (telephone). Audio (no video) only due to patient preference. Alternative for in-person consultation at [facility], [address], [place], [place]. This visit type was conducted due to national recommendations for restrictions regarding the COVID-19 Pandemic (e.g. social distancing).  This format is felt to be most appropriate for this patient at this time.  All issues noted in this document were discussed and addressed.   Patient ID: Sampson Self, male   DOB: 1946/04/20, 75 y.o.   MRN: [id number]

## 2021-02-28 ENCOUNTER — Telehealth: Payer: Self-pay | Admitting: Hematology

## 2021-02-28 NOTE — Telephone Encounter (Signed)
Left message with follow-up appointment per 7/6 los. 

## 2021-03-04 ENCOUNTER — Encounter: Payer: Self-pay | Admitting: Hematology

## 2021-03-11 ENCOUNTER — Ambulatory Visit (HOSPITAL_COMMUNITY)
Admission: RE | Admit: 2021-03-11 | Discharge: 2021-03-11 | Disposition: A | Discharge status: Home / Self Care | Payer: Medicare PPO | Source: Ambulatory Visit | Attending: Diagnostic Radiology | Admitting: Diagnostic Radiology

## 2021-03-11 ENCOUNTER — Other Ambulatory Visit (HOSPITAL_COMMUNITY): Payer: Self-pay | Admitting: Diagnostic Radiology

## 2021-03-11 ENCOUNTER — Other Ambulatory Visit: Payer: Self-pay

## 2021-03-11 DIAGNOSIS — C189 Malignant neoplasm of colon, unspecified: Secondary | ICD-10-CM

## 2021-03-11 DIAGNOSIS — C220 Liver cell carcinoma: Secondary | ICD-10-CM | POA: Diagnosis present

## 2021-03-11 MED ORDER — SULFUR HEXAFLUORIDE MICROSPH 60.7-25 MG IJ SUSR
5.0000 mL | Freq: Once | INTRAMUSCULAR | Status: AC | PRN
  Administered 2021-03-11: 2.4 mL via INTRAVENOUS

## 2021-03-11 MED ORDER — SULFUR HEXAFLUORIDE MICROSPH 60.7-25 MG IJ SUSR
INTRAMUSCULAR | Status: AC
  Filled 2021-03-11: qty 10

## 2021-03-12 ENCOUNTER — Other Ambulatory Visit (HOSPITAL_COMMUNITY): Payer: Self-pay

## 2021-03-14 ENCOUNTER — Other Ambulatory Visit (HOSPITAL_COMMUNITY): Payer: Self-pay | Admitting: Diagnostic Radiology

## 2021-03-14 DIAGNOSIS — C220 Liver cell carcinoma: Secondary | ICD-10-CM

## 2021-03-21 ENCOUNTER — Other Ambulatory Visit (HOSPITAL_COMMUNITY): Payer: Self-pay

## 2021-03-24 ENCOUNTER — Other Ambulatory Visit (HOSPITAL_COMMUNITY): Payer: Self-pay

## 2021-03-25 ENCOUNTER — Other Ambulatory Visit (HOSPITAL_COMMUNITY): Payer: Self-pay

## 2021-03-26 ENCOUNTER — Other Ambulatory Visit: Payer: Self-pay | Admitting: Diagnostic Radiology

## 2021-04-01 ENCOUNTER — Other Ambulatory Visit: Payer: Self-pay | Admitting: Diagnostic Radiology

## 2021-04-03 ENCOUNTER — Telehealth: Payer: Self-pay

## 2021-04-03 NOTE — Telephone Encounter (Signed)
Contacted pt to discuss upcoming IR procedure and management of lenvatinib. Instructed pt per Dr Irene Limbo to hold lenvatinib for 2 weeks post IR procedure. Pt acknowledged. Pt appointment changed to be seen 2 weeks post procedure with labs. Pt aware of date and times of lab and MD appointment. Dr Irene Limbo will discuss lab work and restarting medication at that time. Pt verbalized understanding.

## 2021-04-04 ENCOUNTER — Ambulatory Visit (HOSPITAL_COMMUNITY): Payer: Medicare PPO

## 2021-04-04 ENCOUNTER — Other Ambulatory Visit (HOSPITAL_COMMUNITY): Payer: Medicare PPO

## 2021-04-09 ENCOUNTER — Inpatient Hospital Stay: Payer: Medicare PPO

## 2021-04-09 ENCOUNTER — Inpatient Hospital Stay: Payer: Medicare PPO | Admitting: Hematology

## 2021-04-12 ENCOUNTER — Other Ambulatory Visit: Payer: Self-pay | Admitting: Hematology

## 2021-04-14 ENCOUNTER — Encounter: Payer: Self-pay | Admitting: Hematology

## 2021-04-17 ENCOUNTER — Other Ambulatory Visit: Payer: Self-pay | Admitting: Hematology

## 2021-04-17 DIAGNOSIS — C220 Liver cell carcinoma: Secondary | ICD-10-CM

## 2021-04-18 ENCOUNTER — Other Ambulatory Visit: Payer: Self-pay

## 2021-04-18 ENCOUNTER — Other Ambulatory Visit (HOSPITAL_COMMUNITY): Payer: Self-pay

## 2021-04-18 ENCOUNTER — Encounter: Payer: Self-pay | Admitting: Hematology

## 2021-04-21 ENCOUNTER — Other Ambulatory Visit: Payer: Self-pay | Admitting: Hematology

## 2021-04-21 ENCOUNTER — Other Ambulatory Visit (HOSPITAL_COMMUNITY): Payer: Self-pay

## 2021-04-21 MED ORDER — LENVIMA (12 MG DAILY DOSE) 3 X 4 MG PO CPPK
ORAL_CAPSULE | ORAL | 2 refills | Status: DC
  Filled 2021-04-30 (×2): qty 90, 30d supply, fill #0
  Filled 2021-05-20: qty 90, 30d supply, fill #1
  Filled 2021-07-21: qty 90, 30d supply, fill #2

## 2021-04-22 ENCOUNTER — Other Ambulatory Visit: Payer: Self-pay | Admitting: Diagnostic Radiology

## 2021-04-23 ENCOUNTER — Other Ambulatory Visit (HOSPITAL_COMMUNITY): Payer: Self-pay

## 2021-04-24 MED ORDER — LC BEADS 100-300UM IN SALINE
150.0000 mg | Freq: Once | Status: DC
  Filled 2021-04-24: qty 75

## 2021-04-25 ENCOUNTER — Encounter: Payer: Self-pay | Admitting: Diagnostic Radiology

## 2021-04-25 ENCOUNTER — Other Ambulatory Visit: Payer: Self-pay | Admitting: Radiology

## 2021-04-25 ENCOUNTER — Other Ambulatory Visit: Payer: Self-pay | Admitting: Diagnostic Radiology

## 2021-04-25 NOTE — Progress Notes (Signed)
Patient ID: Aaron [name], male   DOB: 1946-05-23, 75 y.o.   MRN: [id number]  Patient was scheduled for chemoembolization with DEB-TACE on 04/29/21.  Patient has multifocal HCC and worsening renal function.  Patient was recently seen by Dr. Royce Macadamia in Nephrology who has made changes to medications and creatinine is slowly improving.  Discussed chemoembolization with Dr. Royce Macadamia and she recommended holding off on giving intravascular iodinated contrast until she can re-evaluate him again.  Discussed with [name] and he is comfortable with delaying treatment for now.   Patient will continue with his lenvatinib.

## 2021-04-29 ENCOUNTER — Ambulatory Visit (HOSPITAL_COMMUNITY)
Admission: RE | Admit: 2021-04-29 | Discharge: 2021-04-29 | Disposition: A | Discharge status: Home / Self Care | Payer: Medicare PPO | Source: Ambulatory Visit | Attending: Diagnostic Radiology | Admitting: Diagnostic Radiology

## 2021-04-29 ENCOUNTER — Other Ambulatory Visit (HOSPITAL_COMMUNITY): Payer: Medicare PPO

## 2021-04-29 ENCOUNTER — Encounter (HOSPITAL_COMMUNITY): Payer: Self-pay

## 2021-04-30 ENCOUNTER — Other Ambulatory Visit (HOSPITAL_COMMUNITY): Payer: Self-pay

## 2021-05-01 DIAGNOSIS — R946 Abnormal results of thyroid function studies: Secondary | ICD-10-CM | POA: Diagnosis not present

## 2021-05-01 DIAGNOSIS — E119 Type 2 diabetes mellitus without complications: Secondary | ICD-10-CM | POA: Diagnosis not present

## 2021-05-01 DIAGNOSIS — D649 Anemia, unspecified: Secondary | ICD-10-CM | POA: Diagnosis not present

## 2021-05-01 DIAGNOSIS — E7800 Pure hypercholesterolemia, unspecified: Secondary | ICD-10-CM | POA: Diagnosis not present

## 2021-05-01 DIAGNOSIS — C229 Malignant neoplasm of liver, not specified as primary or secondary: Secondary | ICD-10-CM | POA: Diagnosis not present

## 2021-05-02 DIAGNOSIS — C220 Liver cell carcinoma: Secondary | ICD-10-CM | POA: Diagnosis not present

## 2021-05-02 DIAGNOSIS — N179 Acute kidney failure, unspecified: Secondary | ICD-10-CM | POA: Diagnosis not present

## 2021-05-02 DIAGNOSIS — Z23 Encounter for immunization: Secondary | ICD-10-CM | POA: Diagnosis not present

## 2021-05-02 DIAGNOSIS — I129 Hypertensive chronic kidney disease with stage 1 through stage 4 chronic kidney disease, or unspecified chronic kidney disease: Secondary | ICD-10-CM | POA: Diagnosis not present

## 2021-05-02 DIAGNOSIS — D472 Monoclonal gammopathy: Secondary | ICD-10-CM | POA: Diagnosis not present

## 2021-05-02 DIAGNOSIS — E1122 Type 2 diabetes mellitus with diabetic chronic kidney disease: Secondary | ICD-10-CM | POA: Diagnosis not present

## 2021-05-02 DIAGNOSIS — N1832 Chronic kidney disease, stage 3b: Secondary | ICD-10-CM | POA: Diagnosis not present

## 2021-05-08 DIAGNOSIS — E1149 Type 2 diabetes mellitus with other diabetic neurological complication: Secondary | ICD-10-CM | POA: Diagnosis not present

## 2021-05-08 DIAGNOSIS — C220 Liver cell carcinoma: Secondary | ICD-10-CM | POA: Diagnosis not present

## 2021-05-08 DIAGNOSIS — E1142 Type 2 diabetes mellitus with diabetic polyneuropathy: Secondary | ICD-10-CM | POA: Diagnosis not present

## 2021-05-08 DIAGNOSIS — I10 Essential (primary) hypertension: Secondary | ICD-10-CM | POA: Diagnosis not present

## 2021-05-08 DIAGNOSIS — D472 Monoclonal gammopathy: Secondary | ICD-10-CM | POA: Diagnosis not present

## 2021-05-08 DIAGNOSIS — E7800 Pure hypercholesterolemia, unspecified: Secondary | ICD-10-CM | POA: Diagnosis not present

## 2021-05-08 DIAGNOSIS — N184 Chronic kidney disease, stage 4 (severe): Secondary | ICD-10-CM | POA: Diagnosis not present

## 2021-05-13 ENCOUNTER — Other Ambulatory Visit: Payer: Self-pay

## 2021-05-13 DIAGNOSIS — C220 Liver cell carcinoma: Secondary | ICD-10-CM

## 2021-05-14 ENCOUNTER — Inpatient Hospital Stay: Payer: Medicare PPO | Admitting: Hematology

## 2021-05-14 ENCOUNTER — Inpatient Hospital Stay: Payer: Medicare PPO

## 2021-05-16 ENCOUNTER — Inpatient Hospital Stay (HOSPITAL_BASED_OUTPATIENT_CLINIC_OR_DEPARTMENT_OTHER): Payer: Medicare PPO | Admitting: Hematology

## 2021-05-16 ENCOUNTER — Other Ambulatory Visit: Payer: Self-pay

## 2021-05-16 ENCOUNTER — Telehealth: Payer: Self-pay | Admitting: Hematology

## 2021-05-16 ENCOUNTER — Inpatient Hospital Stay: Payer: Medicare PPO | Attending: Hematology

## 2021-05-16 VITALS — BP 159/90 | HR 62 | Temp 97.7°F | Resp 18 | Ht 72.0 in | Wt 245.2 lb

## 2021-05-16 DIAGNOSIS — D472 Monoclonal gammopathy: Secondary | ICD-10-CM | POA: Diagnosis not present

## 2021-05-16 DIAGNOSIS — C220 Liver cell carcinoma: Secondary | ICD-10-CM

## 2021-05-16 DIAGNOSIS — Z79899 Other long term (current) drug therapy: Secondary | ICD-10-CM | POA: Diagnosis not present

## 2021-05-16 DIAGNOSIS — D649 Anemia, unspecified: Secondary | ICD-10-CM | POA: Insufficient documentation

## 2021-05-16 LAB — CBC WITH DIFFERENTIAL (CANCER CENTER ONLY)
Abs Immature Granulocytes: 0.01 K/uL (ref 0.00–0.07)
Basophils Absolute: 0 K/uL (ref 0.0–0.1)
Basophils Relative: 1 %
Eosinophils Absolute: 0.2 K/uL (ref 0.0–0.5)
Eosinophils Relative: 4 %
HCT: 41.5 % (ref 39.0–52.0)
Hemoglobin: 12.8 g/dL — ABNORMAL LOW (ref 13.0–17.0)
Immature Granulocytes: 0 %
Lymphocytes Relative: 21 %
Lymphs Abs: 1.1 K/uL (ref 0.7–4.0)
MCH: 22.9 pg — ABNORMAL LOW (ref 26.0–34.0)
MCHC: 30.8 g/dL (ref 30.0–36.0)
MCV: 74.2 fL — ABNORMAL LOW (ref 80.0–100.0)
Monocytes Absolute: 0.5 K/uL (ref 0.1–1.0)
Monocytes Relative: 10 %
Neutro Abs: 3.2 K/uL (ref 1.7–7.7)
Neutrophils Relative %: 64 %
Platelet Count: 176 K/uL (ref 150–400)
RBC: 5.59 MIL/uL (ref 4.22–5.81)
RDW: 16 % — ABNORMAL HIGH (ref 11.5–15.5)
WBC Count: 5 K/uL (ref 4.0–10.5)
nRBC: 0 % (ref 0.0–0.2)

## 2021-05-16 LAB — CMP (CANCER CENTER ONLY)
ALT: 28 U/L (ref 0–44)
AST: 38 U/L (ref 15–41)
Albumin: 3.7 g/dL (ref 3.5–5.0)
Alkaline Phosphatase: 70 U/L (ref 38–126)
Anion gap: 8 (ref 5–15)
BUN: 26 mg/dL — ABNORMAL HIGH (ref 8–23)
CO2: 25 mmol/L (ref 22–32)
Calcium: 9.8 mg/dL (ref 8.9–10.3)
Chloride: 105 mmol/L (ref 98–111)
Creatinine: 2.15 mg/dL — ABNORMAL HIGH (ref 0.61–1.24)
GFR, Estimated: 32 mL/min — ABNORMAL LOW (ref >60)
Glucose, Bld: 142 mg/dL — ABNORMAL HIGH (ref 70–99)
Potassium: 4.3 mmol/L (ref 3.5–5.1)
Sodium: 138 mmol/L (ref 135–145)
Total Bilirubin: 0.6 mg/dL (ref 0.3–1.2)
Total Protein: 8 g/dL (ref 6.5–8.1)

## 2021-05-16 NOTE — Telephone Encounter (Signed)
Scheduled appt per 9/23 los - mailed letter with appt date and time   

## 2021-05-17 LAB — AFP TUMOR MARKER: AFP, Serum, Tumor Marker: 29.9 ng/mL — ABNORMAL HIGH (ref 0.0–8.4)

## 2021-05-20 ENCOUNTER — Other Ambulatory Visit (HOSPITAL_COMMUNITY): Payer: Self-pay

## 2021-05-23 ENCOUNTER — Encounter: Payer: Self-pay | Admitting: Hematology

## 2021-05-23 NOTE — Progress Notes (Signed)
HEMATOLOGY/ONCOLOGY CLINIC NOTE  Date of Service: .05/16/2021   Patient Care Team: [name], DO as PCP - General (Family Medicine)  CHIEF COMPLAINTS/PURPOSE OF CONSULTATION:  F/u for [facility] on Lenvatinib  HISTORY OF PRESENTING ILLNESS:   Aaron [name] is a wonderful 75 y.o. male who has been referred to [place] by Dr Jani Gravel for evaluation and management of monoclonal gammopathy of undetermined significance.  Patient has history of hypertension, diabetes, dyslipidemia, hepatocellular carcinoma (rx with TACE and percutaneous thermal ablation), hepatitis C status post treatment, iron deficiency anemia in 2014 treated with oral iron.  Patient had an SPEP done by his primary care physician on 10/04/2015 that showed an M spike of 0.3 g/dL. It was presumably will be done due to his complaints of fatigue. He has had no significant anemia. No new bone pains. No fevers/chills/drenching night sweats.  No new anemia..  Outside labs show hemoglobin of 12.4 with microcytosis with an MCV of 70.5, normal WBC count of 5.1k.  No evidence of hypercalcemia or significant renal failure on his outside labs.   INTERVAL HISTORY:  Aaron [name] is here for his scheduled follow-up for [facility]. The patient's last visit with [place] was on 02/26/2021. The pt reports that he is doing well overall.  The pt reports no new issues or symptoms since the last visit. He continues to tolerate the Lenvima well.   He has had a delay in his IR image guided tumor embolization due to elevated kidney function and nephrologist recommendation to delay any contrast exposure.  This is now scheduled for 06/06/2021.  Lab results today -05/16/2021 CBC within normal limits, CMP stable with a creatinine of 2.15 AFP tumor marker 29.9 up from 22.  On review of systems, pt no new abdominal pain, no fevers no chills no night sweats no unexpected weight loss .   MEDICAL HISTORY:  Past Medical History:  Diagnosis Date   Arthritis     Diabetes mellitus without complication ([place])    type II    Elevated liver enzymes    Fatty liver    Hepatitis C    genotype 1A status post treatment with Linzie Collin and Ribavarin for 24 weeks   Hepatocellular carcinoma ([place]) 01/30/2014   Path   History of colon polyps    Hyperlipidemia    Hypertension    Iron deficiency anemia    hx of    MGUS (monoclonal gammopathy of unknown significance)    Obesity Hepatocellular carcinoma treated with TACE and percutaneous thermal ablation by interventional radiology. Last MRI on 08/06/2015 showed slight decrease in the size of the ablation defect involving the right lobe of the liver area and no findings to suggest residual or recurrent hepatocellular carcinoma. Mild changes of liver cirrhosis. MRI Abd 06/24/2016- no evidence of recurrent HCC   Hepatitis C genotype 1A status post treatment with Viekira and Ribavarin for 24 weeks.  Monoclonal gammopathy of undetermined significance.  SURGICAL HISTORY:  Status post microwave ablation[October 2015] of liver lesion and TACE [July 2015] for [place]. EGD and colonoscopy in 2016   SOCIAL HISTORY: Social History   Socioeconomic History   Marital status: Married    Spouse name: Not on file   Number of children: Not on file   Years of education: Not on file   Highest education level: Not on file  Occupational History   Not on file  Tobacco Use   Smoking status: Former    Types: Cigarettes    Quit date: 01/30/1994  Years since quitting: 27.3   Smokeless tobacco: Never   Tobacco comments:    stopped 25 yrs ago  Vaping Use   Vaping Use: Never used  Substance and Sexual Activity   Alcohol use: No    Comment: stopped 30 years ago    Drug use: No   Sexual activity: Not Currently  Other Topics Concern   Not on file  Social History Narrative   Not on file   Social Determinants of Health   Financial Resource Strain: Not on file  Food Insecurity: Not on file  Transportation Needs: Not on file   Physical Activity: Not on file  Stress: Not on file  Social Connections: Not on file  Intimate Partner Violence: Not on file  Former smoker and smoked 1 pack per day for about 20 years starting at age 89 quit 20 years ago.  FAMILY HISTORY: No family history on file.  ALLERGIES:  has No Known Allergies.  MEDICATIONS:  Current Outpatient Medications  Medication Sig Dispense Refill   amLODipine (NORVASC) 10 MG tablet Take 1 tablet (10 mg total) by mouth daily. 30 tablet 5   aspirin EC 81 MG tablet Take 81 mg by mouth every morning.      cholecalciferol (VITAMIN D) 1000 UNITS tablet Take 1,000 Units by mouth every morning.      glipiZIDE (GLUCOTROL XL) 2.5 MG 24 hr tablet Take 2.5 mg by mouth daily with breakfast.     Insulin Glargine (LANTUS SOLOSTAR) 100 UNIT/ML Solostar Pen Inject 32 Units into the skin daily.     lenvatinib 12 mg daily dose (LENVIMA, 12 MG DAILY DOSE,) 3 x 4 MG capsule TAKE 3 CAPSULES (12MG) BY MOUTH DAILY. 90 each 2   Metoprolol Succinate 50 MG CS24 1 capsule     Multiple Vitamin (MULTIVITAMIN WITH MINERALS) TABS tablet Take 1 tablet daily by mouth.     simvastatin (ZOCOR) 20 MG tablet Take 20 mg by mouth daily.     ACCU-CHEK AVIVA PLUS test strip      Accu-Chek Softclix Lancets lancets      ALPRAZolam (XANAX) 0.5 MG tablet Take 1 tablet (0.5 mg total) by mouth as directed. Take 2 tablets (1.0 mg total) prior to MR scan.  Make take an additional 0.5 mg po if needed. (Patient not taking: No sig reported) 3 tablet 0   lisinopril-hydrochlorothiazide (ZESTORETIC) 20-12.5 MG tablet Take 2 tablets by mouth daily with breakfast. (Patient not taking: Reported on 05/16/2021) 180 tablet 1   metFORMIN (GLUCOPHAGE) 1000 MG tablet Take 1,000 mg by mouth 2 (two) times daily with a meal. (Patient not taking: No sig reported)     omeprazole (PRILOSEC) 20 MG capsule Take 1 capsule (20 mg total) by mouth daily. (Patient not taking: No sig reported) 30 capsule 0   oxyCODONE (OXY  IR/ROXICODONE) 5 MG immediate release tablet Take 1 tablet (5 mg total) by mouth every 6 (six) hours as needed for severe pain. (Patient not taking: No sig reported) 15 tablet 0   PAZEO 0.7 % SOLN Place 1 drop into both eyes every morning.  (Patient not taking: No sig reported)  3   Current Facility-Administered Medications  Medication Dose Route Frequency Provider Last Rate Last Admin   oxyCODONE (Oxy IR/ROXICODONE) immediate release tablet 5 mg  5 mg Oral Q6H PRN Jacqualine Mau, NP        REVIEW OF SYSTEMS:   .10 Point review of Systems was done is negative except as noted above.  PHYSICAL EXAMINATION: ECOG FS:1 - Symptomatic but completely ambulatory  Vitals:   05/16/21 0902  BP: (!) 159/90  Pulse: 62  Resp: 18  Temp: 97.7 F (36.5 C)  SpO2: 100%   Wt Readings from Last 3 Encounters:  05/16/21 245 lb 3.2 oz (111.2 kg)  02/26/21 255 lb 14.4 oz (116.1 kg)  11/20/20 246 lb 6.4 oz (111.8 kg)   Body mass index is 33.26 kg/m.   [name] Kitchen GENERAL:alert, in no acute distress and comfortable SKIN: no acute rashes, no significant lesions EYES: conjunctiva are pink and non-injected, sclera anicteric OROPHARYNX: MMM, no exudates, no oropharyngeal erythema or ulceration NECK: supple, no JVD LYMPH:  no palpable lymphadenopathy in the cervical, axillary or inguinal regions LUNGS: clear to auscultation b/l with normal respiratory effort HEART: regular rate & rhythm ABDOMEN:  normoactive bowel sounds , non tender, not distended. Extremity: no pedal edema PSYCH: alert & oriented x 3 with fluent speech NEURO: no focal motor/sensory deficits   LABORATORY DATA:  I have reviewed the data as listed  CBC Latest Ref Rng & Units 05/16/2021 02/26/2021 01/10/2021  WBC 4.0 - 10.5 K/uL 5.0 4.2 3.7(L)  Hemoglobin 13.0 - 17.0 g/dL 12.8(L) 12.0(L) 12.0(L)  Hematocrit 39.0 - 52.0 % 41.5 39.0 38.9(L)  Platelets 150 - 400 K/uL 176 184 202    CBC    Component Value Date/Time   WBC 5.0 05/16/2021  0849   WBC 4.2 02/26/2021 1459   RBC 5.59 05/16/2021 0849   HGB 12.8 (L) 05/16/2021 0849   HGB 11.5 (L) 03/22/2017 1402   HCT 41.5 05/16/2021 0849   HCT 37.0 (L) 03/22/2017 1402   PLT 176 05/16/2021 0849   PLT 262 03/22/2017 1402   MCV 74.2 (L) 05/16/2021 0849   MCV 72.3 (L) 03/22/2017 1402   MCH 22.9 (L) 05/16/2021 0849   MCHC 30.8 05/16/2021 0849   RDW 16.0 (H) 05/16/2021 0849   RDW 15.6 (H) 03/22/2017 1402   LYMPHSABS 1.1 05/16/2021 0849   LYMPHSABS 1.8 03/22/2017 1402   MONOABS 0.5 05/16/2021 0849   MONOABS 0.4 03/22/2017 1402   EOSABS 0.2 05/16/2021 0849   EOSABS 0.1 03/22/2017 1402   BASOSABS 0.0 05/16/2021 0849   BASOSABS 0.0 03/22/2017 1402    . CMP Latest Ref Rng & Units 05/16/2021 02/26/2021 01/10/2021  Glucose 70 - 99 mg/dL 142(H) 113(H) 197(H)  BUN 8 - 23 mg/dL 26(H) 41(H) 32(H)  Creatinine 0.61 - 1.24 mg/dL 2.15(H) 2.36(H) 2.17(H)  Sodium 135 - 145 mmol/L 138 138 139  Potassium 3.5 - 5.1 mmol/L 4.3 4.5 4.9  Chloride 98 - 111 mmol/L 105 106 101  CO2 22 - 32 mmol/L _0 Calcium 8.9 - 10.3 mg/dL 9.8 9.5 9.4  Total Protein 6.5 - 8.1 g/dL 8.0 7.7 7.9  Total Bilirubin 0.3 - 1.2 mg/dL 0.6 0.4 0.4  Alkaline Phos 38 - 126 U/L 70 55 79  AST 15 - 41 U/L 38 30 31  ALT 0 - 44 U/L _1 06/21/2019 MR ABDOMEN WWO CONTRAST (Accession [id number])     IFE 1  Comment    Comments: Immunofixation shows IgG monoclonal protein with kappa light chain  specificity.           RADIOGRAPHIC STUDIES:  MRI abd w and wo contrast: 12/17/2016: IMPRESSION: 1. Postprocedural changes of thermal ablation again noted in the right lobe of the liver, without definitive evidence to suggest local recurrence of disease. No new hepatic lesions are noted. 2. Additional  incidental findings, as above.     Electronically Signed   By: Vinnie Langton M.D.   On: 12/17/2016 09:42    MRI abd w and wo contrast 01/26/2020 IMPRESSION: 1. Interval enlargement of multiple hepatic  masses as detailed above, consistent with progression of multifocal hepatocellular carcinoma. There is evidence of prior ablation in hepatic segments VII and VIII. 2. No evidence of metastatic disease in the abdomen.   Electronically Signed   By: Eddie Candle M.D.   On: 01/27/2020 15:51   ASSESSMENT & PLAN:   75 y.o. male with  #1 Monoclonal gammopathy of undetermined significance.  M protein 0.2 mg/dL immunofixation showing IgG monoclonal protein with kappa light chain specificity. SPEP 02/2016 - 0.3g/dl  No overt anemia.  No overt hypercalcemia. Stable CKD creatinine 1.6  #2 bone lucencies in the right radial mid midshaft and left humeral head. PET/CT did not show any hypermetabolic bone lesions. MRI left shoulder showed no evidence of metastatic disease or multiple myeloma. The x-ray findings appear to be areas of mild demineralization. Patient was seen by orthopedics and no additional recommendations were given.  Plan -Patient's anemia is stable. No significant change in renal function. No new bone pains. No hypercalcemia . No overall no overt evidence of progression of multiple myeloma. -Myeloma labs from today show stable M protein @ 0.2g/dl with no evidence of progression- consistent with MGUS.  #3 Hepatocellular carcinoma treated with TACE and percutaneous thermal ablation by interventional radiology.   Last MRI on 08/06/2015 showed slight decrease in the size of the ablation defect involving the right lobe of the liver area and no findings to suggest residual or recurrent hepatocellular carcinoma. Mild changes of liver cirrhosis.  01/16/2016 MRI Abdomen showed resolution of previously ablated lesion but a new 1.3 cm lesion was noted which was subsequently ablated under CT guidance by interventional radiology on 01/31/2016. Elevated transaminases on 02/01/2016 were likely related to his ablation. These have since resolved.   06/24/2016 MRI Abdomen showed no evidence of recurrent  hepatocellular carcinoma . 12/17/2016 MRI Abdomen shows no evidence of recurrent hepatocellular carcinoma .  10/26/17 MRI Abdomen revealed Motion degraded images. Status post thermal ablation of the segment 8 lesion, without enhancement to suggest residual viable tumor. Prior ablation of a segment 7 lesion, grossly unchanged. No convincing central enhancement  03/14/2019  MRI abdomen w and wo contrast revealed "1. New areas of restricted diffusion, surrounding nodular arterial phase enhancement and delayed washout surrounding the ablation zone defects anteriorly in segments 8 and 7 consistent with local recurrence of hepatocellular carcinoma. 2. The peripheral ablation zone defect laterally in segment 8 is unchanged. 3. No extrahepatic tumor identified."  10/02/2019 MRI Abdomen ([id number]) revealed "1. Progressive multifocal hepatocellular carcinoma, especially anteriorly around the ablated lesion in segments 4B and 8. There are additional new lesions in segments 2 and 3. No evidence of extrahepatic metastatic disease."  01/26/2020 MRI Abdomen ([id number]) revealed "1. Interval enlargement of multiple hepatic masses as detailed above, consistent with progression of multifocal hepatocellular carcinoma. There is evidence of prior ablation in hepatic segments VII and VIII. 2. No evidence of metastatic disease in the abdomen."  #4 Hepatitis C genotype 1A status post treatment with Viekira and Ribavarin for 24 weeks.   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    PLAN:  -Discussed pt labwork, 05/16/2021 CBC within normal limits, CMP stable, AFP tumor marker 27 up from 22 -Continue Lenvima 12 mg p.o. daily  at this time.  -Follow-up with Dr. Anselm Pancoast IR as scheduled on 06/06/2021 for tumor embolization.. -Patient has been recommended to hold his Lenvima started the day before tumor embolization and for 14 days after his liver functions can  be rechecked and noted to be stable. -Recommended again pt try to increase water intake to 48-64 oz daily. -Will see back in 6 weeks with labs.   FOLLOW UP: RTC with Dr Irene Limbo with labs in 6 weeks   The total time spent in the appointment was 20 minutes and more than 50% was on counseling and direct patient cares.    All of the patient's questions were answered with apparent satisfaction. The patient knows to call the clinic with any problems, questions or concerns.   [name] MD [place] AAHIVMS [facility] Hematology/Oncology Physician [facility]

## 2021-05-26 DIAGNOSIS — N1832 Chronic kidney disease, stage 3b: Secondary | ICD-10-CM | POA: Diagnosis not present

## 2021-05-29 ENCOUNTER — Other Ambulatory Visit (HOSPITAL_COMMUNITY): Payer: Self-pay

## 2021-06-04 ENCOUNTER — Other Ambulatory Visit (HOSPITAL_COMMUNITY): Payer: Self-pay | Admitting: Physician Assistant

## 2021-06-05 ENCOUNTER — Other Ambulatory Visit: Payer: Self-pay | Admitting: Internal Medicine

## 2021-06-05 ENCOUNTER — Other Ambulatory Visit: Payer: Self-pay | Admitting: Radiology

## 2021-06-06 ENCOUNTER — Other Ambulatory Visit (HOSPITAL_COMMUNITY): Payer: Self-pay | Admitting: Diagnostic Radiology

## 2021-06-06 ENCOUNTER — Other Ambulatory Visit: Payer: Self-pay

## 2021-06-06 ENCOUNTER — Encounter (HOSPITAL_COMMUNITY): Payer: Self-pay

## 2021-06-06 ENCOUNTER — Ambulatory Visit (HOSPITAL_COMMUNITY)
Admission: RE | Admit: 2021-06-06 | Discharge: 2021-06-06 | Disposition: A | Discharge status: Home / Self Care | Payer: Medicare PPO | Source: Ambulatory Visit | Attending: Diagnostic Radiology | Admitting: Diagnostic Radiology

## 2021-06-06 DIAGNOSIS — C220 Liver cell carcinoma: Secondary | ICD-10-CM

## 2021-06-06 DIAGNOSIS — E785 Hyperlipidemia, unspecified: Secondary | ICD-10-CM | POA: Insufficient documentation

## 2021-06-06 DIAGNOSIS — Z7982 Long term (current) use of aspirin: Secondary | ICD-10-CM | POA: Diagnosis not present

## 2021-06-06 DIAGNOSIS — Z7984 Long term (current) use of oral hypoglycemic drugs: Secondary | ICD-10-CM | POA: Insufficient documentation

## 2021-06-06 DIAGNOSIS — E119 Type 2 diabetes mellitus without complications: Secondary | ICD-10-CM | POA: Diagnosis not present

## 2021-06-06 DIAGNOSIS — I10 Essential (primary) hypertension: Secondary | ICD-10-CM | POA: Diagnosis not present

## 2021-06-06 DIAGNOSIS — Z794 Long term (current) use of insulin: Secondary | ICD-10-CM | POA: Insufficient documentation

## 2021-06-06 DIAGNOSIS — Z87891 Personal history of nicotine dependence: Secondary | ICD-10-CM | POA: Diagnosis not present

## 2021-06-06 HISTORY — PX: IR US GUIDE VASC ACCESS RIGHT: IMG2390

## 2021-06-06 HISTORY — PX: IR ANGIOGRAM SELECTIVE EACH ADDITIONAL VESSEL: IMG667

## 2021-06-06 HISTORY — PX: IR EMBO TUMOR ORGAN ISCHEMIA INFARCT INC GUIDE ROADMAPPING: IMG5449

## 2021-06-06 LAB — COMPREHENSIVE METABOLIC PANEL
ALT: 26 U/L (ref 0–44)
AST: 38 U/L (ref 15–41)
Albumin: 3.8 g/dL (ref 3.5–5.0)
Alkaline Phosphatase: 63 U/L (ref 38–126)
Anion gap: 8 (ref 5–15)
BUN: 30 mg/dL — ABNORMAL HIGH (ref 8–23)
CO2: 23 mmol/L (ref 22–32)
Calcium: 8.7 mg/dL — ABNORMAL LOW (ref 8.9–10.3)
Chloride: 105 mmol/L (ref 98–111)
Creatinine, Ser: 2.23 mg/dL — ABNORMAL HIGH (ref 0.61–1.24)
GFR, Estimated: 30 mL/min — ABNORMAL LOW (ref >60)
Glucose, Bld: 127 mg/dL — ABNORMAL HIGH (ref 70–99)
Potassium: 4.2 mmol/L (ref 3.5–5.1)
Sodium: 136 mmol/L (ref 135–145)
Total Bilirubin: 0.7 mg/dL (ref 0.3–1.2)
Total Protein: 7.7 g/dL (ref 6.5–8.1)

## 2021-06-06 LAB — CBC WITH DIFFERENTIAL/PLATELET
Abs Immature Granulocytes: 0.02 10*3/uL (ref 0.00–0.07)
Basophils Absolute: 0 10*3/uL (ref 0.0–0.1)
Basophils Relative: 1 %
Eosinophils Absolute: 0.2 10*3/uL (ref 0.0–0.5)
Eosinophils Relative: 5 %
HCT: 39.6 % (ref 39.0–52.0)
Hemoglobin: 12.1 g/dL — ABNORMAL LOW (ref 13.0–17.0)
Immature Granulocytes: 0 %
Lymphocytes Relative: 25 %
Lymphs Abs: 1.2 10*3/uL (ref 0.7–4.0)
MCH: 23 pg — ABNORMAL LOW (ref 26.0–34.0)
MCHC: 30.6 g/dL (ref 30.0–36.0)
MCV: 75.1 fL — ABNORMAL LOW (ref 80.0–100.0)
Monocytes Absolute: 0.5 10*3/uL (ref 0.1–1.0)
Monocytes Relative: 10 %
Neutro Abs: 2.9 10*3/uL (ref 1.7–7.7)
Neutrophils Relative %: 59 %
Platelets: 185 10*3/uL (ref 150–400)
RBC: 5.27 MIL/uL (ref 4.22–5.81)
RDW: 16.3 % — ABNORMAL HIGH (ref 11.5–15.5)
WBC: 4.8 10*3/uL (ref 4.0–10.5)
nRBC: 0 % (ref 0.0–0.2)

## 2021-06-06 LAB — PROTIME-INR
INR: 1 (ref 0.8–1.2)
Prothrombin Time: 13.2 seconds (ref 11.4–15.2)

## 2021-06-06 LAB — GLUCOSE, CAPILLARY: Glucose-Capillary: 117 mg/dL — ABNORMAL HIGH (ref 70–99)

## 2021-06-06 MED ORDER — SODIUM CHLORIDE 0.9 % IV SOLN: INTRAVENOUS | Status: DC

## 2021-06-06 MED ORDER — DEXAMETHASONE SODIUM PHOSPHATE 10 MG/ML IJ SOLN
4.0000 mg | INTRAMUSCULAR | Status: AC
  Administered 2021-06-06: 4 mg via INTRAVENOUS
  Filled 2021-06-06: qty 1

## 2021-06-06 MED ORDER — ONDANSETRON HCL 4 MG/2ML IJ SOLN
INTRAMUSCULAR | Status: AC
  Filled 2021-06-06: qty 2

## 2021-06-06 MED ORDER — FENTANYL CITRATE (PF) 100 MCG/2ML IJ SOLN
INTRAMUSCULAR | Status: DC | PRN
  Administered 2021-06-06: 50 ug via INTRAVENOUS

## 2021-06-06 MED ORDER — ACETAMINOPHEN 500 MG PO TABS
1000.0000 mg | ORAL_TABLET | Freq: Once | ORAL | Status: AC
  Administered 2021-06-06: 1000 mg via ORAL
  Filled 2021-06-06: qty 2

## 2021-06-06 MED ORDER — ONDANSETRON HCL 4 MG/2ML IJ SOLN
8.0000 mg | INTRAMUSCULAR | Status: AC
  Administered 2021-06-06: 8 mg via INTRAVENOUS
  Filled 2021-06-06: qty 4

## 2021-06-06 MED ORDER — MIDAZOLAM HCL 2 MG/2ML IJ SOLN
INTRAMUSCULAR | Status: DC | PRN
  Administered 2021-06-06: 1 mg via INTRAVENOUS

## 2021-06-06 MED ORDER — FENTANYL CITRATE (PF) 100 MCG/2ML IJ SOLN
INTRAMUSCULAR | Status: AC
  Filled 2021-06-06: qty 2

## 2021-06-06 MED ORDER — PIPERACILLIN-TAZOBACTAM 3.375 G IVPB
3.3750 g | INTRAVENOUS | Status: AC
  Administered 2021-06-06: 3.375 g via INTRAVENOUS
  Filled 2021-06-06: qty 50

## 2021-06-06 MED ORDER — IODIXANOL 320 MG/ML IV SOLN
50.0000 mL | Freq: Once | INTRAVENOUS | Status: AC | PRN
  Administered 2021-06-06: 10 mL via INTRA_ARTERIAL

## 2021-06-06 MED ORDER — IODIXANOL 320 MG/ML IV SOLN
50.0000 mL | Freq: Once | INTRAVENOUS | Status: AC | PRN
  Administered 2021-06-06: 15 mL via INTRA_ARTERIAL

## 2021-06-06 MED ORDER — MIDAZOLAM HCL 2 MG/2ML IJ SOLN
INTRAMUSCULAR | Status: AC
  Filled 2021-06-06: qty 4

## 2021-06-06 MED ORDER — OXYCODONE HCL 5 MG PO TABS: 5.0000 mg | ORAL_TABLET | Freq: Four times a day (QID) | ORAL | 0 refills | Status: DC | PRN

## 2021-06-06 MED ORDER — LIDOCAINE HCL (PF) 1 % IJ SOLN
INTRAMUSCULAR | Status: DC | PRN
  Administered 2021-06-06: 5 mL

## 2021-06-06 MED ORDER — LIDOCAINE HCL 1 % IJ SOLN
INTRAMUSCULAR | Status: AC
  Filled 2021-06-06: qty 20

## 2021-06-06 NOTE — Procedures (Signed)
Interventional Radiology Procedure:   Indications: [place] with enlarging hepatic lesions  Procedure: Visceral angiography with bland embolization of left hepatic arteries  Findings: Abnormal left hepatic arteries corresponding with known disease but do not see distinct lesions. Treated medial and lateral left hepatic artery segments with 100-300 micron Embospheres.  Markedly decreased flow in left hepatic arteries after embolization.   Complications: No immediate complications noted.     EBL: Minimal  Contrast: 25 ml  Plan: Bedrest 3 hours and then discharge to home if asymptomatic.    Elianne Gubser R. Anselm Pancoast, MD  Pager: [phone]

## 2021-06-06 NOTE — Sedation Documentation (Signed)
Ns at 250/hr per MD. During case pt received 650cc. NS

## 2021-06-06 NOTE — Progress Notes (Signed)
Pt complaining of right shoulder pain from recent fall prior to admission for today's procedure. Pt requested 2 extra strength tylenol. Rowe Robert PA for IR okayed for pt to have.

## 2021-06-06 NOTE — Progress Notes (Signed)
Patient ID: Aaron [name], male   DOB: 27-Jan-1946, 75 y.o.   MRN: [id number] Per order of Dr. Anselm Pancoast, pt electronically prescribed oxycodone 5 mg one tab every 6 hrs as needed for moderate pain, # 10, no refills.

## 2021-06-06 NOTE — H&P (Signed)
Chief Complaint: Patient was seen in consultation today for embolization of hepatic tumor  at the request of Henn,Adam  Referring Physician(s): Henn,Adam  Supervising Physician: Markus Daft  Patient Status: [facility] - Out-pt  History of Present Illness: Aaron [name] is a 75 y.o. male with PMH of DM type II, elevated liver enzymes, hepatitis C, hepatocellular carcinoma, HLD, HTN, IDA, and MGUS.  Patient has had previous Y 90 treatment of hepatic tumors with good response.  MR abdomen on 02/22/2021 showed progression and moderate enlargement in left lobe liver lesions.  Patient is here today for embolization of hepatic tumors.  Patient's previous procedure was delayed due to kidney function. He is being followed by nephrology.   IMPRESSION: 1. The more homogeneously T2 hyperintense liver lesions have progressed in the interim, demonstrating moderate enlargement and compatible with active malignancy. However, there several T2 hypointense or heterogeneous lesions likely reflecting previously treated lesions which demonstrate either stability or mild reduction in size. No new liver lesions are identified.  Past Medical History:  Diagnosis Date   Arthritis    Diabetes mellitus without complication ([place])    type II    Elevated liver enzymes    Fatty liver    Hepatitis C    genotype 1A status post treatment with Linzie Collin and Ribavarin for 24 weeks   Hepatocellular carcinoma ([place]) 01/30/2014   Path   History of colon polyps    Hyperlipidemia    Hypertension    Iron deficiency anemia    hx of    MGUS (monoclonal gammopathy of unknown significance)     Past Surgical History:  Procedure Laterality Date   COLONOSCOPY     ESOPHAGOGASTRODUODENOSCOPY  2016   IR ANGIOGRAM SELECTIVE EACH ADDITIONAL VESSEL  04/04/2019   IR ANGIOGRAM SELECTIVE EACH ADDITIONAL VESSEL  04/04/2019   IR ANGIOGRAM SELECTIVE EACH ADDITIONAL VESSEL  04/04/2019   IR ANGIOGRAM SELECTIVE EACH ADDITIONAL VESSEL   04/04/2019   IR ANGIOGRAM SELECTIVE EACH ADDITIONAL VESSEL  04/04/2019   IR ANGIOGRAM SELECTIVE EACH ADDITIONAL VESSEL  04/04/2019   IR ANGIOGRAM SELECTIVE EACH ADDITIONAL VESSEL  04/04/2019   IR ANGIOGRAM SELECTIVE EACH ADDITIONAL VESSEL  05/16/2019   IR ANGIOGRAM SELECTIVE EACH ADDITIONAL VESSEL  05/16/2019   IR ANGIOGRAM SELECTIVE EACH ADDITIONAL VESSEL  05/16/2019   IR ANGIOGRAM SELECTIVE EACH ADDITIONAL VESSEL  02/14/2020   IR ANGIOGRAM SELECTIVE EACH ADDITIONAL VESSEL  02/14/2020   IR ANGIOGRAM SELECTIVE EACH ADDITIONAL VESSEL  02/14/2020   IR ANGIOGRAM SELECTIVE EACH ADDITIONAL VESSEL  02/14/2020   IR ANGIOGRAM SELECTIVE EACH ADDITIONAL VESSEL  03/01/2020   IR ANGIOGRAM SELECTIVE EACH ADDITIONAL VESSEL  03/01/2020   IR ANGIOGRAM SELECTIVE EACH ADDITIONAL VESSEL  03/01/2020   IR ANGIOGRAM SELECTIVE EACH ADDITIONAL VESSEL  03/01/2020   IR ANGIOGRAM SELECTIVE EACH ADDITIONAL VESSEL  03/01/2020   IR ANGIOGRAM SELECTIVE EACH ADDITIONAL VESSEL  06/18/2020   IR ANGIOGRAM SELECTIVE EACH ADDITIONAL VESSEL  06/18/2020   IR ANGIOGRAM SELECTIVE EACH ADDITIONAL VESSEL  11/19/2020   IR ANGIOGRAM SELECTIVE EACH ADDITIONAL VESSEL  11/19/2020   IR ANGIOGRAM VISCERAL SELECTIVE  04/04/2019   IR ANGIOGRAM VISCERAL SELECTIVE  05/16/2019   IR ANGIOGRAM VISCERAL SELECTIVE  02/14/2020   IR ANGIOGRAM VISCERAL SELECTIVE  02/14/2020   IR ANGIOGRAM VISCERAL SELECTIVE  03/01/2020   IR ANGIOGRAM VISCERAL SELECTIVE  06/18/2020   IR ANGIOGRAM VISCERAL SELECTIVE  11/19/2020   IR EMBO ARTERIAL NOT HEMORR HEMANG INC GUIDE ROADMAPPING  02/14/2020   IR EMBO TUMOR ORGAN ISCHEMIA INFARCT INC GUIDE  ROADMAPPING  04/04/2019   IR EMBO TUMOR ORGAN ISCHEMIA INFARCT INC GUIDE ROADMAPPING  05/16/2019   IR EMBO TUMOR ORGAN ISCHEMIA INFARCT INC GUIDE ROADMAPPING  03/01/2020   IR EMBO TUMOR ORGAN ISCHEMIA INFARCT INC GUIDE ROADMAPPING  06/18/2020   IR EMBO TUMOR ORGAN ISCHEMIA INFARCT INC GUIDE ROADMAPPING  11/19/2020   IR GENERIC HISTORICAL  02/26/2016   IR  RADIOLOGIST EVAL & MGMT 02/26/2016 Markus Daft, MD [facility] INTERV RAD   IR GENERIC HISTORICAL  01/16/2016   IR RADIOLOGIST EVAL & MGMT 01/16/2016 Markus Daft, MD [facility] RAD   IR GENERIC HISTORICAL  06/24/2016   IR RADIOLOGIST EVAL & MGMT 06/24/2016 Aletta Edouard, MD [facility] INTER[facility] RAD   IR RADIOLOGIST EVAL & MGMT  12/17/2016   IR RADIOLOGIST EVAL & MGMT  06/16/2017   IR RADIOLOGIST EVAL & MGMT  09/08/2017   IR RADIOLOGIST EVAL & MGMT  10/26/2017   IR RADIOLOGIST EVAL & MGMT  05/12/2018   IR RADIOLOGIST EVAL & MGMT  07/26/2018   IR RADIOLOGIST EVAL & MGMT  03/22/2019   IR RADIOLOGIST EVAL & MGMT  04/11/2019   IR RADIOLOGIST EVAL & MGMT  06/28/2019   IR RADIOLOGIST EVAL & MGMT  10/10/2019   IR RADIOLOGIST EVAL & MGMT  05/21/2020   IR RADIOLOGIST EVAL & MGMT  06/12/2020   IR RADIOLOGIST EVAL & MGMT  10/24/2020   IR RADIOLOGIST EVAL & MGMT  12/11/2020   IR RADIOLOGIST EVAL & MGMT  02/27/2021   IR US GUIDE VASC ACCESS LEFT  05/16/2019   IR US GUIDE VASC ACCESS RIGHT  04/04/2019   IR US GUIDE VASC ACCESS RIGHT  02/14/2020   IR US GUIDE VASC ACCESS RIGHT  03/01/2020   IR US GUIDE VASC ACCESS RIGHT  06/18/2020   IR US GUIDE VASC ACCESS RIGHT  11/19/2020   POLYPECTOMY     RADIOFREQUENCY ABLATION N/A 07/21/2017   Procedure: CT MICROWAVE THERMAL ABLATION-LIVER;  Surgeon: Markus Daft, MD;  Location: [facility] ORS;  Service: Anesthesiology;  Laterality: N/A;   RADIOFREQUENCY ABLATION N/A 06/29/2018   Procedure: CT MICROWAVE THERMAL ABLATION;  Surgeon: Markus Daft, MD;  Location: [facility];  Service: Anesthesiology;  Laterality: N/A;    Allergies: Patient has no known allergies.  Medications: Prior to Admission medications   Medication Sig Start Date End Date Taking? Authorizing Provider  amLODipine (NORVASC) 10 MG tablet Take 1 tablet (10 mg total) by mouth daily. 11/20/20  Yes Brunetta Genera, MD  aspirin EC 81 MG tablet Take 81 mg by mouth every morning.    Yes [provider]  cholecalciferol (VITAMIN D) 1000  UNITS tablet Take 1,000 Units by mouth every morning.    Yes [provider]  glipiZIDE (GLUCOTROL XL) 2.5 MG 24 hr tablet Take 2.5 mg by mouth daily with breakfast.   Yes [provider]  Insulin Glargine (LANTUS SOLOSTAR) 100 UNIT/ML Solostar Pen Inject 32 Units into the skin daily.   Yes [provider]  lenvatinib 12 mg daily dose (LENVIMA, 12 MG DAILY DOSE,) 3 x 4 MG capsule TAKE 3 CAPSULES (12MG ) BY MOUTH DAILY. 04/21/21 04/21/22 Yes Brunetta Genera, MD  Metoprolol Succinate 50 MG CS24 1 capsule   Yes [provider]  Multiple Vitamin (MULTIVITAMIN WITH MINERALS) TABS tablet Take 1 tablet daily by mouth.   Yes [provider]  simvastatin (ZOCOR) 20 MG tablet Take 20 mg by mouth daily.   Yes [provider]  ACCU-CHEK AVIVA PLUS test strip  11/19/19  [provider]  Accu-Chek Softclix Lancets lancets  09/12/19   [provider]  ALPRAZolam Duanne Moron) 0.5 MG tablet Take 1 tablet (0.5 mg total) by mouth as directed. Take 2 tablets (1.0 mg total) prior to MR scan.  Make take an additional 0.5 mg po if needed. Patient not taking: No sig reported 10/26/17   Markus Daft, MD  lisinopril-hydrochlorothiazide (ZESTORETIC) 20-12.5 MG tablet Take 2 tablets by mouth daily with breakfast. Patient not taking: Reported on 05/16/2021 04/18/21   Brunetta Genera, MD  metFORMIN (GLUCOPHAGE) 1000 MG tablet Take 1,000 mg by mouth 2 (two) times daily with a meal. Patient not taking: No sig reported    [provider]  omeprazole (PRILOSEC) 20 MG capsule Take 1 capsule (20 mg total) by mouth daily. Patient not taking: No sig reported 06/18/20   Candiss Norse A, PA-C  oxyCODONE (OXY IR/ROXICODONE) 5 MG immediate release tablet Take 1 tablet (5 mg total) by mouth every 6 (six) hours as needed for severe pain. Patient not taking: No sig reported 03/01/20   Brynda Greathouse Sue-Ellen, PA  PAZEO 0.7 % SOLN Place 1 drop into both eyes  every morning.  Patient not taking: No sig reported 05/09/18   [provider]     History reviewed. No pertinent family history.  Social History   Socioeconomic History   Marital status: Married    Spouse name: Not on file   Number of children: Not on file   Years of education: Not on file   Highest education level: Not on file  Occupational History   Not on file  Tobacco Use   Smoking status: Former    Types: Cigarettes    Quit date: 01/30/1994    Years since quitting: 27.3   Smokeless tobacco: Never   Tobacco comments:    stopped 25 yrs ago  Vaping Use   Vaping Use: Never used  Substance and Sexual Activity   Alcohol use: No    Comment: stopped 30 years ago    Drug use: No   Sexual activity: Not Currently  Other Topics Concern   Not on file  Social History Narrative   Not on file   Social Determinants of Health   Financial Resource Strain: Not on file  Food Insecurity: Not on file  Transportation Needs: Not on file  Physical Activity: Not on file  Stress: Not on file  Social Connections: Not on file     Review of Systems: A 12 point ROS discussed and pertinent positives are indicated in the HPI above.  All other systems are negative.  Review of Systems  Constitutional:  Negative for chills and fever.  HENT:  Negative for nosebleeds.   Respiratory:  Negative for cough and shortness of breath.   Cardiovascular:  Negative for chest pain and leg swelling.  Gastrointestinal:  Positive for diarrhea. Negative for abdominal pain, blood in stool, nausea and vomiting.  Genitourinary:  Negative for hematuria.  Neurological:  Positive for headaches. Negative for dizziness, weakness and light-headedness.   Vital Signs: BP (!) 163/82   Pulse 75   Temp 98.1 F (36.7 C) (Oral)   Resp 15   SpO2 100%   Physical Exam Vitals reviewed.  Constitutional:      Appearance: Normal appearance.  HENT:     Head: Normocephalic and atraumatic.     Mouth/Throat:      Mouth: Mucous membranes are moist.     Pharynx: Oropharynx is clear.  Cardiovascular:  Rate and Rhythm: Normal rate and regular rhythm.     Pulses: Normal pulses.     Heart sounds: Normal heart sounds. No murmur heard.   No gallop.  Pulmonary:     Effort: Pulmonary effort is normal. No respiratory distress.     Breath sounds: No stridor. No wheezing, rhonchi or rales.  Abdominal:     General: There is distension.     Palpations: Abdomen is soft.     Tenderness: There is no abdominal tenderness. There is no guarding.  Musculoskeletal:     Right lower leg: No edema.     Left lower leg: No edema.  Skin:    General: Skin is warm and dry.  Neurological:     Mental Status: He is alert and oriented to person, place, and time.  Psychiatric:        Mood and Affect: Mood normal.        Behavior: Behavior normal.        Thought Content: Thought content normal.        Judgment: Judgment normal.    Imaging: No results found.  Labs:  CBC: Recent Labs    01/10/21 0919 02/26/21 1459 05/16/21 0849 06/06/21 0734  WBC 3.7* 4.2 5.0 4.8  HGB 12.0* 12.0* 12.8* 12.1*  HCT 38.9* 39.0 41.5 39.6  PLT 202 184 176 185    COAGS: Recent Labs    06/18/20 0900 11/19/20 0835 06/06/21 0734  INR 1.0 1.0 1.0  APTT 35  --   --     BMP: Recent Labs    01/10/21 0919 02/26/21 1459 05/16/21 0849 06/06/21 0734  NA 139 138 138 136  K 4.9 4.5 4.3 4.2  CL 101 106 105 105  CO2 28 24 25 23   GLUCOSE 197* 113* 142* 127*  BUN 32* 41* 26* 30*  CALCIUM 9.4 9.5 9.8 8.7*  CREATININE 2.17* 2.36* 2.15* 2.23*  GFRNONAA 31* 28* 32* 30*    LIVER FUNCTION TESTS: Recent Labs    01/10/21 0919 02/26/21 1459 05/16/21 0849 06/06/21 0734  BILITOT 0.4 0.4 0.6 0.7  AST 31 30 38 38  ALT 19 21 28 26   ALKPHOS 79 55 70 63  PROT 7.9 7.7 8.0 7.7  ALBUMIN 3.6 3.7 3.7 3.8    TUMOR MARKERS: No results for input(s): AFPTM, CEA, CA199, CHROMGRNA in the last 8760 hours.  Assessment and Plan: History  of DM type II, elevated liver enzymes, hepatitis C, hepatocellular carcinoma, HLD, HTN, IDA, and MGUS.  Patient has had previous Y 90 treatment of hepatic tumors with good response.  MR abdomen on 02/22/2021 showed progression and moderate enlargement in left lobe liver lesions.  Patient is here today for embolization of hepatic tumors.  Patient's previous procedure was delayed due to kidney function. He is being followed by nephrology.   Patient resting quietly on stretcher.  He is alert and oriented, calm and pleasant. He is in no distress.  Vital signs are stable.  Labs 06/06/2021: Creatinine 2.23   (05/16/2021: Creatinine 2.15) BUN greater than 30 GFR less than 30 Dr. Anselm Pancoast aware of patient's kidney function and has ordered preprocedure IV hydration.  Risks and benefits of embolization of hepatic tumor were discussed with the patient including, but not limited to bleeding, infection, vascular injury or contrast induced renal failure.  This interventional procedure involves the use of X-rays and because of the nature of the planned procedure, it is possible that we will have prolonged use of X-ray fluoroscopy.  Potential  radiation risks to you include (but are not limited to) the following: - A slightly elevated risk for cancer  several years later in life. This risk is typically less than 0.5% percent. This risk is low in comparison to the normal incidence of human cancer, which is 33% for women and 50% for men according to the [place]. - Radiation induced injury can include skin redness, resembling a rash, tissue breakdown / ulcers and hair loss (which can be temporary or permanent).   The likelihood of either of these occurring depends on the difficulty of the procedure and whether you are sensitive to radiation due to previous procedures, disease, or genetic conditions.   IF your procedure requires a prolonged use of radiation, you will be notified and given written  instructions for further action.  It is your responsibility to monitor the irradiated area for the 2 weeks following the procedure and to notify your physician if you are concerned that you have suffered a radiation induced injury.    All of the patient's questions were answered, patient is agreeable to proceed.  Consent signed and in chart.  Thank you for this interesting consult.  I greatly enjoyed meeting Lawyer Washabaugh and look forward to participating in their care.  A copy of this report was sent to the requesting provider on this date.  Electronically Signed: Tyson Alias, NP 06/06/2021, 8:37 AM   I spent a total of 30 minutes in face to face in clinical consultation, greater than 50% of which was counseling/coordinating care for hepatic tumor embolization.

## 2021-06-25 ENCOUNTER — Other Ambulatory Visit: Payer: Self-pay

## 2021-06-25 ENCOUNTER — Ambulatory Visit
Admission: RE | Admit: 2021-06-25 | Discharge: 2021-06-25 | Disposition: A | Discharge status: Home / Self Care | Payer: Medicare PPO | Source: Ambulatory Visit | Attending: Radiology | Admitting: Radiology

## 2021-06-25 DIAGNOSIS — C220 Liver cell carcinoma: Secondary | ICD-10-CM

## 2021-06-26 ENCOUNTER — Other Ambulatory Visit: Payer: Self-pay

## 2021-06-26 DIAGNOSIS — C220 Liver cell carcinoma: Secondary | ICD-10-CM

## 2021-06-27 ENCOUNTER — Inpatient Hospital Stay: Payer: Medicare PPO

## 2021-06-27 ENCOUNTER — Inpatient Hospital Stay: Payer: Medicare PPO | Attending: Hematology | Admitting: Hematology

## 2021-06-27 ENCOUNTER — Other Ambulatory Visit: Payer: Self-pay

## 2021-06-27 ENCOUNTER — Other Ambulatory Visit (HOSPITAL_COMMUNITY): Payer: Self-pay

## 2021-06-27 VITALS — BP 154/91 | HR 86 | Temp 97.9°F | Resp 19 | Ht 72.0 in | Wt 243.4 lb

## 2021-06-27 DIAGNOSIS — C220 Liver cell carcinoma: Secondary | ICD-10-CM

## 2021-06-27 DIAGNOSIS — D472 Monoclonal gammopathy: Secondary | ICD-10-CM | POA: Diagnosis not present

## 2021-06-27 LAB — CBC WITH DIFFERENTIAL (CANCER CENTER ONLY)
Abs Immature Granulocytes: 0.04 10*3/uL (ref 0.00–0.07)
Basophils Absolute: 0 10*3/uL (ref 0.0–0.1)
Basophils Relative: 1 %
Eosinophils Absolute: 0.2 10*3/uL (ref 0.0–0.5)
Eosinophils Relative: 2 %
HCT: 38.8 % — ABNORMAL LOW (ref 39.0–52.0)
Hemoglobin: 11.9 g/dL — ABNORMAL LOW (ref 13.0–17.0)
Immature Granulocytes: 1 %
Lymphocytes Relative: 18 %
Lymphs Abs: 1.3 10*3/uL (ref 0.7–4.0)
MCH: 22.4 pg — ABNORMAL LOW (ref 26.0–34.0)
MCHC: 30.7 g/dL (ref 30.0–36.0)
MCV: 73.1 fL — ABNORMAL LOW (ref 80.0–100.0)
Monocytes Absolute: 0.7 10*3/uL (ref 0.1–1.0)
Monocytes Relative: 10 %
Neutro Abs: 5.1 10*3/uL (ref 1.7–7.7)
Neutrophils Relative %: 68 %
Platelet Count: 282 10*3/uL (ref 150–400)
RBC: 5.31 MIL/uL (ref 4.22–5.81)
RDW: 14.6 % (ref 11.5–15.5)
WBC Count: 7.4 10*3/uL (ref 4.0–10.5)
nRBC: 0 % (ref 0.0–0.2)

## 2021-06-27 LAB — CMP (CANCER CENTER ONLY)
ALT: 27 U/L (ref 0–44)
AST: 35 U/L (ref 15–41)
Albumin: 3.6 g/dL (ref 3.5–5.0)
Alkaline Phosphatase: 122 U/L (ref 38–126)
Anion gap: 13 (ref 5–15)
BUN: 31 mg/dL — ABNORMAL HIGH (ref 8–23)
CO2: 24 mmol/L (ref 22–32)
Calcium: 9.6 mg/dL (ref 8.9–10.3)
Chloride: 103 mmol/L (ref 98–111)
Creatinine: 2.22 mg/dL — ABNORMAL HIGH (ref 0.61–1.24)
GFR, Estimated: 30 mL/min — ABNORMAL LOW (ref >60)
Glucose, Bld: 162 mg/dL — ABNORMAL HIGH (ref 70–99)
Potassium: 4.3 mmol/L (ref 3.5–5.1)
Sodium: 140 mmol/L (ref 135–145)
Total Bilirubin: 0.6 mg/dL (ref 0.3–1.2)
Total Protein: 8.5 g/dL — ABNORMAL HIGH (ref 6.5–8.1)

## 2021-06-27 LAB — MAGNESIUM: Magnesium: 1.7 mg/dL (ref 1.7–2.4)

## 2021-06-27 MED ORDER — AMLODIPINE BESYLATE 10 MG PO TABS: 10.0000 mg | ORAL_TABLET | Freq: Every day | ORAL | 5 refills | Status: DC

## 2021-06-28 LAB — AFP TUMOR MARKER: AFP, Serum, Tumor Marker: 61.8 ng/mL — ABNORMAL HIGH (ref 0.0–8.4)

## 2021-06-30 ENCOUNTER — Other Ambulatory Visit (HOSPITAL_COMMUNITY): Payer: Self-pay

## 2021-06-30 ENCOUNTER — Telehealth: Payer: Self-pay | Admitting: Hematology

## 2021-06-30 DIAGNOSIS — N1832 Chronic kidney disease, stage 3b: Secondary | ICD-10-CM | POA: Diagnosis not present

## 2021-06-30 NOTE — Telephone Encounter (Signed)
Scheduled follow-up appointments per 11/4 los. Patient is aware.

## 2021-07-03 ENCOUNTER — Encounter: Payer: Self-pay | Admitting: Hematology

## 2021-07-03 NOTE — Progress Notes (Addendum)
HEMATOLOGY/ONCOLOGY CLINIC NOTE  Date of Service: .06/27/2021   Patient Care Team: [name], DO as PCP - General (Family Medicine)  CHIEF COMPLAINTS/PURPOSE OF CONSULTATION:  F/u for [facility] on Lenvatinib  HISTORY OF PRESENTING ILLNESS:   Aaron [name] is a wonderful 75 y.o. male who has been referred to [place] by Dr Jani Gravel for evaluation and management of monoclonal gammopathy of undetermined significance.  Patient has history of hypertension, diabetes, dyslipidemia, hepatocellular carcinoma (rx with TACE and percutaneous thermal ablation), hepatitis C status post treatment, iron deficiency anemia in 2014 treated with oral iron.  Patient had an SPEP done by his primary care physician on 10/04/2015 that showed an M spike of 0.3 g/dL. It was presumably will be done due to his complaints of fatigue. He has had no significant anemia. No new bone pains. No fevers/chills/drenching night sweats.  No new anemia..  Outside labs show hemoglobin of 12.4 with microcytosis with an MCV of 70.5, normal WBC count of 5.1k.  No evidence of hypercalcemia or significant renal failure on his outside labs.   INTERVAL HISTORY:  Aaron [name] is here for his scheduled follow-up for [facility]. The patient's last visit with [place] was on 05/16/2021. The pt reports that he is doing well overall.  The pt reports no new issues or symptoms since the last visit. He had on 06/06/2021-  Visceral angiography: common hepatic artery, proper hepatic artery, left hepatic artery lateral segment, left hepatic artery medial segment 2. Bland embolization of the left hepatic artery lateral segment 3. Bland embolization of left hepatic artery medial segment  Patient notes he had some nausea procedure but no other significant toxicities.  No new abdominal pain.  He reports that he did start taking his lenvatinib 2 weeks after his procedure and has tolerated this okay.. Lab results today -06/27/2021 showed stable hemoglobin of 11.9  with a normal WBC count and platelets. CMP stable with stable creatinine of 3.2. Magnesium low 1.7 AFP tumor marker higher at 61.8 up from 29.9 however this could be from due to moderate his recent procedure and we shall monitor to trend this.  On review of systems, pt no new abdominal pain, no fevers no chills no night sweats no unexpected weight loss .   MEDICAL HISTORY:  Past Medical History:  Diagnosis Date   Arthritis    Diabetes mellitus without complication ([place])    type II    Elevated liver enzymes    Fatty liver    Hepatitis C    genotype 1A status post treatment with Linzie Collin and Ribavarin for 24 weeks   Hepatocellular carcinoma ([place]) 01/30/2014   Path   History of colon polyps    Hyperlipidemia    Hypertension    Iron deficiency anemia    hx of    MGUS (monoclonal gammopathy of unknown significance)    Obesity Hepatocellular carcinoma treated with TACE and percutaneous thermal ablation by interventional radiology. Last MRI on 08/06/2015 showed slight decrease in the size of the ablation defect involving the right lobe of the liver area and no findings to suggest residual or recurrent hepatocellular carcinoma. Mild changes of liver cirrhosis. MRI Abd 06/24/2016- no evidence of recurrent HCC   Hepatitis C genotype 1A status post treatment with Viekira and Ribavarin for 24 weeks.  Monoclonal gammopathy of undetermined significance.  SURGICAL HISTORY:  Status post microwave ablation[October 2015] of liver lesion and TACE [July 2015] for [place]. EGD and colonoscopy in 2016   SOCIAL HISTORY: Social History  Socioeconomic History   Marital status: Married    Spouse name: Not on file   Number of children: Not on file   Years of education: Not on file   Highest education level: Not on file  Occupational History   Not on file  Tobacco Use   Smoking status: Former    Types: Cigarettes    Quit date: 01/30/1994    Years since quitting: 27.4   Smokeless tobacco: Never    Tobacco comments:    stopped 25 yrs ago  Vaping Use   Vaping Use: Never used  Substance and Sexual Activity   Alcohol use: No    Comment: stopped 30 years ago    Drug use: No   Sexual activity: Not Currently  Other Topics Concern   Not on file  Social History Narrative   Not on file   Social Determinants of Health   Financial Resource Strain: Not on file  Food Insecurity: Not on file  Transportation Needs: Not on file  Physical Activity: Not on file  Stress: Not on file  Social Connections: Not on file  Intimate Partner Violence: Not on file  Former smoker and smoked 1 pack per day for about 20 years starting at age 11 quit 73 years ago.  FAMILY HISTORY: No family history on file.  ALLERGIES:  has No Known Allergies.  MEDICATIONS:  Current Outpatient Medications  Medication Sig Dispense Refill   ACCU-CHEK AVIVA PLUS test strip      Accu-Chek Softclix Lancets lancets      amLODipine (NORVASC) 10 MG tablet Take 1 tablet (10 mg total) by mouth daily. 30 tablet 5   aspirin EC 81 MG tablet Take 81 mg by mouth every morning.      cholecalciferol (VITAMIN D) 1000 UNITS tablet Take 1,000 Units by mouth every morning.      glipiZIDE (GLUCOTROL XL) 2.5 MG 24 hr tablet Take 2.5 mg by mouth daily with breakfast.     Insulin Glargine (LANTUS SOLOSTAR) 100 UNIT/ML Solostar Pen Inject 32 Units into the skin daily.     lenvatinib 12 mg daily dose (LENVIMA, 12 MG DAILY DOSE,) 3 x 4 MG capsule TAKE 3 CAPSULES ($RemoveBef'12MG'uoOVsmveIO$ ) BY MOUTH DAILY. 90 each 2   Metoprolol Succinate 50 MG CS24 1 capsule     Multiple Vitamin (MULTIVITAMIN WITH MINERALS) TABS tablet Take 1 tablet daily by mouth.     oxyCODONE (OXY IR/ROXICODONE) 5 MG immediate release tablet Take 1 tablet (5 mg total) by mouth every 6 (six) hours as needed for severe pain. (Patient not taking: No sig reported) 15 tablet 0   oxyCODONE (ROXICODONE) 5 MG immediate release tablet Take 1 tablet (5 mg total) by mouth every 6 (six) hours as needed for  severe pain. (Patient not taking: Reported on 06/27/2021) 10 tablet 0   PAZEO 0.7 % SOLN Place 1 drop into both eyes every morning.  (Patient not taking: No sig reported)  3   simvastatin (ZOCOR) 20 MG tablet Take 20 mg by mouth daily.     Current Facility-Administered Medications  Medication Dose Route Frequency Provider Last Rate Last Admin   oxyCODONE (Oxy IR/ROXICODONE) immediate release tablet 5 mg  5 mg Oral Q6H PRN Jacqualine Mau, NP        REVIEW OF SYSTEMS:   .10 Point review of Systems was done is negative except as noted above.  PHYSICAL EXAMINATION: ECOG FS:1 - Symptomatic but completely ambulatory  Vitals:   06/27/21 0858  BP: [name] Kitchen)  154/91  Pulse: 86  Resp: 19  Temp: 97.9 F (36.6 C)  SpO2: 100%   Wt Readings from Last 3 Encounters:  06/27/21 243 lb 6.4 oz (110.4 kg)  05/16/21 245 lb 3.2 oz (111.2 kg)  02/26/21 255 lb 14.4 oz (116.1 kg)   Body mass index is 33.01 kg/m.   [name] Kitchen GENERAL:alert, in no acute distress and comfortable SKIN: no acute rashes, no significant lesions EYES: conjunctiva are pink and non-injected, sclera anicteric OROPHARYNX: MMM, no exudates, no oropharyngeal erythema or ulceration NECK: supple, no JVD LYMPH:  no palpable lymphadenopathy in the cervical, axillary or inguinal regions LUNGS: clear to auscultation b/l with normal respiratory effort HEART: regular rate & rhythm ABDOMEN:  normoactive bowel sounds , non tender, not distended. Extremity: no pedal edema PSYCH: alert & oriented x 3 with fluent speech NEURO: no focal motor/sensory deficits  LABORATORY DATA:  I have reviewed the data as listed  CBC Latest Ref Rng & Units 06/27/2021 06/06/2021 05/16/2021  WBC 4.0 - 10.5 K/uL 7.4 4.8 5.0  Hemoglobin 13.0 - 17.0 g/dL 11.9(L) 12.1(L) 12.8(L)  Hematocrit 39.0 - 52.0 % 38.8(L) 39.6 41.5  Platelets 150 - 400 K/uL 282 185 176    CBC    Component Value Date/Time   WBC 7.4 06/27/2021 0842   WBC 4.8 06/06/2021 0734   RBC 5.31  06/27/2021 0842   HGB 11.9 (L) 06/27/2021 0842   HGB 11.5 (L) 03/22/2017 1402   HCT 38.8 (L) 06/27/2021 0842   HCT 37.0 (L) 03/22/2017 1402   PLT 282 06/27/2021 0842   PLT 262 03/22/2017 1402   MCV 73.1 (L) 06/27/2021 0842   MCV 72.3 (L) 03/22/2017 1402   MCH 22.4 (L) 06/27/2021 0842   MCHC 30.7 06/27/2021 0842   RDW 14.6 06/27/2021 0842   RDW 15.6 (H) 03/22/2017 1402   LYMPHSABS 1.3 06/27/2021 0842   LYMPHSABS 1.8 03/22/2017 1402   MONOABS 0.7 06/27/2021 0842   MONOABS 0.4 03/22/2017 1402   EOSABS 0.2 06/27/2021 0842   EOSABS 0.1 03/22/2017 1402   BASOSABS 0.0 06/27/2021 0842   BASOSABS 0.0 03/22/2017 1402    . CMP Latest Ref Rng & Units 06/27/2021 06/06/2021 05/16/2021  Glucose 70 - 99 mg/dL 162(H) 127(H) 142(H)  BUN 8 - 23 mg/dL 31(H) 30(H) 26(H)  Creatinine 0.61 - 1.24 mg/dL 2.22(H) 2.23(H) 2.15(H)  Sodium 135 - 145 mmol/L 140 136 138  Potassium 3.5 - 5.1 mmol/L 4.3 4.2 4.3  Chloride 98 - 111 mmol/L 103 105 105  CO2 22 - 32 mmol/L _0 Calcium 8.9 - 10.3 mg/dL 9.6 8.7(L) 9.8  Total Protein 6.5 - 8.1 g/dL 8.5(H) 7.7 8.0  Total Bilirubin 0.3 - 1.2 mg/dL 0.6 0.7 0.6  Alkaline Phos 38 - 126 U/L 122 63 70  AST 15 - 41 U/L 35 38 38  ALT 0 - 44 U/L _1 06/21/2019 MR ABDOMEN WWO CONTRAST (Accession [id number])     IFE 1  Comment    Comments: Immunofixation shows IgG monoclonal protein with kappa light chain  specificity.           RADIOGRAPHIC STUDIES:  MRI abd w and wo contrast: 12/17/2016: IMPRESSION: 1. Postprocedural changes of thermal ablation again noted in the right lobe of the liver, without definitive evidence to suggest local recurrence of disease. No new hepatic lesions are noted. 2. Additional incidental findings, as above.     Electronically Signed   By: Mauri Brooklyn.D.  On: 12/17/2016 09:42    MRI abd w and wo contrast 01/26/2020 IMPRESSION: 1. Interval enlargement of multiple hepatic masses as detailed above,  consistent with progression of multifocal hepatocellular carcinoma. There is evidence of prior ablation in hepatic segments VII and VIII. 2. No evidence of metastatic disease in the abdomen.   Electronically Signed   By: Eddie Candle M.D.   On: 01/27/2020 15:51   ASSESSMENT & PLAN:   75 y.o. male with  #1 Monoclonal gammopathy of undetermined significance.  M protein 0.2 mg/dL immunofixation showing IgG monoclonal protein with kappa light chain specificity. SPEP 02/2016 - 0.3g/dl  No overt anemia.  No overt hypercalcemia. Stable CKD creatinine 1.6  #2 bone lucencies in the right radial mid midshaft and left humeral head. PET/CT did not show any hypermetabolic bone lesions. MRI left shoulder showed no evidence of metastatic disease or multiple myeloma. The x-ray findings appear to be areas of mild demineralization. Patient was seen by orthopedics and no additional recommendations were given.  Plan -Patient's anemia is stable. No significant change in renal function. No new bone pains. No hypercalcemia . No overall no overt evidence of progression of multiple myeloma. -Myeloma labs from today show stable M protein @ 0.2g/dl with no evidence of progression- consistent with MGUS.  #3 Hepatocellular carcinoma treated with TACE and percutaneous thermal ablation , Y 90 and recent bland arterial embolization on 06/06/2021 by interventional radiology.   Last MRI on 08/06/2015 showed slight decrease in the size of the ablation defect involving the right lobe of the liver area and no findings to suggest residual or recurrent hepatocellular carcinoma. Mild changes of liver cirrhosis.  01/16/2016 MRI Abdomen showed resolution of previously ablated lesion but a new 1.3 cm lesion was noted which was subsequently ablated under CT guidance by interventional radiology on 01/31/2016. Elevated transaminases on 02/01/2016 were likely related to his ablation. These have since resolved.   06/24/2016 MRI Abdomen  showed no evidence of recurrent hepatocellular carcinoma . 12/17/2016 MRI Abdomen shows no evidence of recurrent hepatocellular carcinoma .  10/26/17 MRI Abdomen revealed Motion degraded images. Status post thermal ablation of the segment 8 lesion, without enhancement to suggest residual viable tumor. Prior ablation of a segment 7 lesion, grossly unchanged. No convincing central enhancement  03/14/2019  MRI abdomen w and wo contrast revealed "1. New areas of restricted diffusion, surrounding nodular arterial phase enhancement and delayed washout surrounding the ablation zone defects anteriorly in segments 8 and 7 consistent with local recurrence of hepatocellular carcinoma. 2. The peripheral ablation zone defect laterally in segment 8 is unchanged. 3. No extrahepatic tumor identified."  10/02/2019 MRI Abdomen ([id number]) revealed "1. Progressive multifocal hepatocellular carcinoma, especially anteriorly around the ablated lesion in segments 4B and 8. There are additional new lesions in segments 2 and 3. No evidence of extrahepatic metastatic disease."  01/26/2020 MRI Abdomen ([id number]) revealed "1. Interval enlargement of multiple hepatic masses as detailed above, consistent with progression of multifocal hepatocellular carcinoma. There is evidence of prior ablation in hepatic segments VII and VIII. 2. No evidence of metastatic disease in the abdomen."  #4 Hepatitis C genotype 1A status post treatment with Viekira and Ribavarin for 24 weeks.   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    PLAN:  -Discussed pt labwork, 06/27/2021 showed stable hemoglobin of 11.9 with a normal WBC count and platelets. CMP stable with stable creatinine of 3.2. Magnesium low 1.7 AFP tumor marker higher at 61.8  up from 29.9 however this could be from due to moderate his recent procedure and we shall monitor to trend this. -Tolerated his bland  arterial embolization on 06/06/2021 without any acute issues. -LFT stable -Restart back on Lenvima 12 mg p.o. daily at this time.  -Follow-up with Dr. Anselm Pancoast IR as scheduled  -Recommended again pt try to increase water intake to 48-64 oz daily. MRI abdomen in 2 months Labs in 2 months Follow-up with Dr. Irene Limbo in 2 months +5 days   FOLLOW UP: MRI abdomen in 2 months Labs in 2 months Follow-up with Dr. Irene Limbo in 2 months +5 days   . The total time spent in the appointment was 25 minutes and more than 50% was on counseling and direct patient cares.   All of the patient's questions were answered with apparent satisfaction. The patient knows to call the clinic with any problems, questions or concerns.   [name] MD MS AAHIVMS [facility] Hematology/Oncology Physician [facility]     .

## 2021-07-06 NOTE — Addendum Note (Signed)
Addended by: [name] on: 07/06/2021 11:46 PM   Modules accepted: Orders

## 2021-07-07 DIAGNOSIS — D472 Monoclonal gammopathy: Secondary | ICD-10-CM | POA: Diagnosis not present

## 2021-07-07 DIAGNOSIS — E1122 Type 2 diabetes mellitus with diabetic chronic kidney disease: Secondary | ICD-10-CM | POA: Diagnosis not present

## 2021-07-07 DIAGNOSIS — R809 Proteinuria, unspecified: Secondary | ICD-10-CM | POA: Diagnosis not present

## 2021-07-07 DIAGNOSIS — C220 Liver cell carcinoma: Secondary | ICD-10-CM | POA: Diagnosis not present

## 2021-07-07 DIAGNOSIS — I129 Hypertensive chronic kidney disease with stage 1 through stage 4 chronic kidney disease, or unspecified chronic kidney disease: Secondary | ICD-10-CM | POA: Diagnosis not present

## 2021-07-07 DIAGNOSIS — N1832 Chronic kidney disease, stage 3b: Secondary | ICD-10-CM | POA: Diagnosis not present

## 2021-07-07 DIAGNOSIS — E1129 Type 2 diabetes mellitus with other diabetic kidney complication: Secondary | ICD-10-CM | POA: Diagnosis not present

## 2021-07-18 DIAGNOSIS — N1832 Chronic kidney disease, stage 3b: Secondary | ICD-10-CM | POA: Diagnosis not present

## 2021-07-21 ENCOUNTER — Other Ambulatory Visit (HOSPITAL_COMMUNITY): Payer: Self-pay

## 2021-07-22 ENCOUNTER — Other Ambulatory Visit (HOSPITAL_COMMUNITY): Payer: Self-pay

## 2021-07-24 ENCOUNTER — Other Ambulatory Visit (HOSPITAL_COMMUNITY): Payer: Self-pay

## 2021-08-06 DIAGNOSIS — N184 Chronic kidney disease, stage 4 (severe): Secondary | ICD-10-CM | POA: Diagnosis not present

## 2021-08-06 DIAGNOSIS — E1149 Type 2 diabetes mellitus with other diabetic neurological complication: Secondary | ICD-10-CM | POA: Diagnosis not present

## 2021-08-06 DIAGNOSIS — E7800 Pure hypercholesterolemia, unspecified: Secondary | ICD-10-CM | POA: Diagnosis not present

## 2021-08-06 DIAGNOSIS — Z125 Encounter for screening for malignant neoplasm of prostate: Secondary | ICD-10-CM | POA: Diagnosis not present

## 2021-08-13 DIAGNOSIS — C220 Liver cell carcinoma: Secondary | ICD-10-CM | POA: Diagnosis not present

## 2021-08-13 DIAGNOSIS — E1142 Type 2 diabetes mellitus with diabetic polyneuropathy: Secondary | ICD-10-CM | POA: Diagnosis not present

## 2021-08-13 DIAGNOSIS — D472 Monoclonal gammopathy: Secondary | ICD-10-CM | POA: Diagnosis not present

## 2021-08-13 DIAGNOSIS — E7800 Pure hypercholesterolemia, unspecified: Secondary | ICD-10-CM | POA: Diagnosis not present

## 2021-08-13 DIAGNOSIS — I10 Essential (primary) hypertension: Secondary | ICD-10-CM | POA: Diagnosis not present

## 2021-08-13 DIAGNOSIS — Z0000 Encounter for general adult medical examination without abnormal findings: Secondary | ICD-10-CM | POA: Diagnosis not present

## 2021-08-13 DIAGNOSIS — N184 Chronic kidney disease, stage 4 (severe): Secondary | ICD-10-CM | POA: Diagnosis not present

## 2021-08-13 DIAGNOSIS — Z23 Encounter for immunization: Secondary | ICD-10-CM | POA: Diagnosis not present

## 2021-08-13 DIAGNOSIS — E1149 Type 2 diabetes mellitus with other diabetic neurological complication: Secondary | ICD-10-CM | POA: Diagnosis not present

## 2021-08-18 DIAGNOSIS — U071 COVID-19: Secondary | ICD-10-CM | POA: Diagnosis not present

## 2021-08-18 DIAGNOSIS — Z20822 Contact with and (suspected) exposure to covid-19: Secondary | ICD-10-CM | POA: Diagnosis not present

## 2021-08-21 ENCOUNTER — Other Ambulatory Visit (HOSPITAL_COMMUNITY): Payer: Self-pay

## 2021-08-21 ENCOUNTER — Other Ambulatory Visit: Payer: Self-pay | Admitting: Hematology

## 2021-08-21 MED ORDER — LENVIMA (12 MG DAILY DOSE) 3 X 4 MG PO CPPK
ORAL_CAPSULE | ORAL | 2 refills | Status: DC
Start: 2021-08-21 — End: 2021-11-17
  Filled 2021-08-21: qty 90, 30d supply, fill #0
  Filled 2021-09-19: qty 90, 30d supply, fill #1
  Filled 2021-10-21: qty 90, 30d supply, fill #2

## 2021-08-22 ENCOUNTER — Other Ambulatory Visit (HOSPITAL_COMMUNITY): Payer: Self-pay

## 2021-08-25 ENCOUNTER — Encounter: Payer: Self-pay | Admitting: Hematology

## 2021-08-27 ENCOUNTER — Telehealth: Payer: Self-pay | Admitting: Hematology

## 2021-08-27 ENCOUNTER — Encounter: Payer: Self-pay | Admitting: Hematology

## 2021-08-27 ENCOUNTER — Other Ambulatory Visit (HOSPITAL_COMMUNITY): Payer: Self-pay

## 2021-08-27 ENCOUNTER — Telehealth: Payer: Self-pay

## 2021-08-27 NOTE — Telephone Encounter (Signed)
Oral Oncology Patient Advocate Encounter   Was successful in securing patient an $54 grant from [facility] [employer] [facility]) to provide copayment coverage for Lenvima.  This will keep the out of pocket expense at $0.     I have spoken with the patient.    The billing information is as follows and has been shared with [place].   Member ID: [id number] Group ID: [id number]    RxBin: [id number] Dates of Eligibility: 05/29/21 through 08/26/22   Fund:  [employer] [place] Patient [place] Phone [phone] Fax [phone] 08/27/2021 8:46 AM

## 2021-08-27 NOTE — Telephone Encounter (Signed)
Patient called to reschedule upcoming appointments due to testing positive for COVID on 12/26.

## 2021-08-29 ENCOUNTER — Inpatient Hospital Stay: Payer: Medicare HMO

## 2021-09-01 ENCOUNTER — Ambulatory Visit (HOSPITAL_COMMUNITY): Payer: Medicare PPO

## 2021-09-03 ENCOUNTER — Other Ambulatory Visit: Payer: Self-pay | Admitting: Hematology

## 2021-09-03 ENCOUNTER — Inpatient Hospital Stay: Payer: Medicare HMO | Admitting: Hematology

## 2021-09-03 DIAGNOSIS — C220 Liver cell carcinoma: Secondary | ICD-10-CM

## 2021-09-08 ENCOUNTER — Ambulatory Visit (HOSPITAL_COMMUNITY)
Admission: RE | Admit: 2021-09-08 | Discharge: 2021-09-08 | Disposition: A | Discharge status: Home / Self Care | Payer: Medicare HMO | Source: Ambulatory Visit | Attending: Hematology | Admitting: Hematology

## 2021-09-08 ENCOUNTER — Other Ambulatory Visit: Payer: Self-pay | Admitting: Hematology

## 2021-09-08 ENCOUNTER — Other Ambulatory Visit: Payer: Self-pay

## 2021-09-08 ENCOUNTER — Inpatient Hospital Stay: Payer: Medicare HMO | Attending: Hematology

## 2021-09-08 DIAGNOSIS — D472 Monoclonal gammopathy: Secondary | ICD-10-CM | POA: Insufficient documentation

## 2021-09-08 DIAGNOSIS — C220 Liver cell carcinoma: Secondary | ICD-10-CM | POA: Insufficient documentation

## 2021-09-08 LAB — CMP (CANCER CENTER ONLY)
ALT: 26 U/L (ref 0–44)
AST: 33 U/L (ref 15–41)
Albumin: 3.8 g/dL (ref 3.5–5.0)
Alkaline Phosphatase: 83 U/L (ref 38–126)
Anion gap: 9 (ref 5–15)
BUN: 25 mg/dL — ABNORMAL HIGH (ref 8–23)
CO2: 23 mmol/L (ref 22–32)
Calcium: 9 mg/dL (ref 8.9–10.3)
Chloride: 106 mmol/L (ref 98–111)
Creatinine: 2.17 mg/dL — ABNORMAL HIGH (ref 0.61–1.24)
GFR, Estimated: 31 mL/min — ABNORMAL LOW (ref >60)
Glucose, Bld: 104 mg/dL — ABNORMAL HIGH (ref 70–99)
Potassium: 4.2 mmol/L (ref 3.5–5.1)
Sodium: 138 mmol/L (ref 135–145)
Total Bilirubin: 0.5 mg/dL (ref 0.3–1.2)
Total Protein: 8.3 g/dL — ABNORMAL HIGH (ref 6.5–8.1)

## 2021-09-08 LAB — CBC WITH DIFFERENTIAL/PLATELET
Abs Immature Granulocytes: 0.01 10*3/uL (ref 0.00–0.07)
Basophils Absolute: 0.1 10*3/uL (ref 0.0–0.1)
Basophils Relative: 1 %
Eosinophils Absolute: 0.4 10*3/uL (ref 0.0–0.5)
Eosinophils Relative: 7 %
HCT: 38.6 % — ABNORMAL LOW (ref 39.0–52.0)
Hemoglobin: 11.7 g/dL — ABNORMAL LOW (ref 13.0–17.0)
Immature Granulocytes: 0 %
Lymphocytes Relative: 23 %
Lymphs Abs: 1.3 10*3/uL (ref 0.7–4.0)
MCH: 22.4 pg — ABNORMAL LOW (ref 26.0–34.0)
MCHC: 30.3 g/dL (ref 30.0–36.0)
MCV: 73.8 fL — ABNORMAL LOW (ref 80.0–100.0)
Monocytes Absolute: 0.5 10*3/uL (ref 0.1–1.0)
Monocytes Relative: 10 %
Neutro Abs: 3.2 10*3/uL (ref 1.7–7.7)
Neutrophils Relative %: 59 %
Platelets: 221 10*3/uL (ref 150–400)
RBC: 5.23 MIL/uL (ref 4.22–5.81)
RDW: 16.9 % — ABNORMAL HIGH (ref 11.5–15.5)
WBC: 5.4 10*3/uL (ref 4.0–10.5)
nRBC: 0 % (ref 0.0–0.2)

## 2021-09-08 NOTE — Progress Notes (Signed)
Patient was a no call no show for his 8am MRI appointment this morning. I will put his order back into ancillary orders to be rescheduled.

## 2021-09-09 ENCOUNTER — Other Ambulatory Visit (HOSPITAL_COMMUNITY): Payer: Medicare HMO

## 2021-09-09 LAB — AFP TUMOR MARKER: AFP, Serum, Tumor Marker: 54.2 ng/mL — ABNORMAL HIGH (ref 0.0–8.4)

## 2021-09-11 ENCOUNTER — Telehealth: Payer: Self-pay | Admitting: Hematology

## 2021-09-11 NOTE — Telephone Encounter (Signed)
Scheduled follow-up appointment per 1/19 staff message. Patient is aware.

## 2021-09-11 NOTE — Telephone Encounter (Signed)
Patient called to cancel tomorrow's appointment due to not having MRI done yet. Patient stated he would call back to reschedule the scan.

## 2021-09-12 ENCOUNTER — Inpatient Hospital Stay: Payer: Medicare HMO | Admitting: Hematology

## 2021-09-19 ENCOUNTER — Other Ambulatory Visit (HOSPITAL_COMMUNITY): Payer: Self-pay

## 2021-09-22 ENCOUNTER — Ambulatory Visit (HOSPITAL_COMMUNITY): Admission: RE | Admit: 2021-09-22 | Payer: Medicare HMO | Source: Ambulatory Visit

## 2021-09-23 DIAGNOSIS — H5203 Hypermetropia, bilateral: Secondary | ICD-10-CM | POA: Diagnosis not present

## 2021-09-23 DIAGNOSIS — H52209 Unspecified astigmatism, unspecified eye: Secondary | ICD-10-CM | POA: Diagnosis not present

## 2021-09-23 DIAGNOSIS — H524 Presbyopia: Secondary | ICD-10-CM | POA: Diagnosis not present

## 2021-09-25 ENCOUNTER — Other Ambulatory Visit (HOSPITAL_COMMUNITY): Payer: Self-pay

## 2021-09-25 DIAGNOSIS — H5203 Hypermetropia, bilateral: Secondary | ICD-10-CM | POA: Diagnosis not present

## 2021-09-27 ENCOUNTER — Ambulatory Visit (HOSPITAL_COMMUNITY): Payer: Medicare HMO

## 2021-09-29 ENCOUNTER — Inpatient Hospital Stay: Payer: Medicare HMO | Admitting: Hematology

## 2021-09-29 ENCOUNTER — Telehealth: Payer: Self-pay

## 2021-09-29 NOTE — Telephone Encounter (Signed)
Contacted pt to give him new MRI appointment date and time. Pt verbalized understanding.

## 2021-09-30 ENCOUNTER — Telehealth: Payer: Self-pay | Admitting: Hematology

## 2021-09-30 NOTE — Telephone Encounter (Signed)
Sch per 2/6 inbasket, pt aware

## 2021-10-04 ENCOUNTER — Ambulatory Visit (HOSPITAL_COMMUNITY)
Admission: RE | Admit: 2021-10-04 | Discharge: 2021-10-04 | Disposition: A | Discharge status: Home / Self Care | Payer: Medicare HMO | Source: Ambulatory Visit | Attending: Hematology | Admitting: Hematology

## 2021-10-04 DIAGNOSIS — K7689 Other specified diseases of liver: Secondary | ICD-10-CM | POA: Diagnosis not present

## 2021-10-04 DIAGNOSIS — C220 Liver cell carcinoma: Secondary | ICD-10-CM | POA: Diagnosis not present

## 2021-10-04 DIAGNOSIS — I700 Atherosclerosis of aorta: Secondary | ICD-10-CM | POA: Diagnosis not present

## 2021-10-04 DIAGNOSIS — M47816 Spondylosis without myelopathy or radiculopathy, lumbar region: Secondary | ICD-10-CM | POA: Diagnosis not present

## 2021-10-04 MED ORDER — GADOBUTROL 1 MMOL/ML IV SOLN
10.0000 mL | Freq: Once | INTRAVENOUS | Status: AC | PRN
  Administered 2021-10-04: 10 mL via INTRAVENOUS

## 2021-10-09 ENCOUNTER — Other Ambulatory Visit: Payer: Self-pay

## 2021-10-09 DIAGNOSIS — C220 Liver cell carcinoma: Secondary | ICD-10-CM

## 2021-10-10 ENCOUNTER — Inpatient Hospital Stay: Payer: Medicare HMO

## 2021-10-10 ENCOUNTER — Inpatient Hospital Stay: Payer: Medicare HMO | Admitting: Hematology

## 2021-10-15 ENCOUNTER — Inpatient Hospital Stay: Payer: Medicare HMO

## 2021-10-15 ENCOUNTER — Inpatient Hospital Stay: Payer: Medicare HMO | Admitting: Hematology

## 2021-10-20 ENCOUNTER — Other Ambulatory Visit (HOSPITAL_COMMUNITY): Payer: Self-pay

## 2021-10-21 ENCOUNTER — Other Ambulatory Visit (HOSPITAL_COMMUNITY): Payer: Self-pay

## 2021-10-27 ENCOUNTER — Other Ambulatory Visit (HOSPITAL_COMMUNITY): Payer: Self-pay

## 2021-11-03 DIAGNOSIS — N1832 Chronic kidney disease, stage 3b: Secondary | ICD-10-CM | POA: Diagnosis not present

## 2021-11-04 ENCOUNTER — Inpatient Hospital Stay: Payer: Medicare HMO | Attending: Hematology

## 2021-11-04 ENCOUNTER — Inpatient Hospital Stay: Payer: Medicare HMO | Admitting: Hematology

## 2021-11-04 ENCOUNTER — Other Ambulatory Visit: Payer: Self-pay

## 2021-11-04 VITALS — BP 145/78 | HR 68 | Temp 97.8°F | Resp 18 | Ht 72.0 in | Wt 227.5 lb

## 2021-11-04 DIAGNOSIS — C220 Liver cell carcinoma: Secondary | ICD-10-CM | POA: Diagnosis not present

## 2021-11-04 DIAGNOSIS — D472 Monoclonal gammopathy: Secondary | ICD-10-CM | POA: Diagnosis not present

## 2021-11-04 DIAGNOSIS — Z8505 Personal history of malignant neoplasm of liver: Secondary | ICD-10-CM | POA: Insufficient documentation

## 2021-11-04 DIAGNOSIS — I700 Atherosclerosis of aorta: Secondary | ICD-10-CM | POA: Diagnosis not present

## 2021-11-04 LAB — MAGNESIUM: Magnesium: 1.8 mg/dL (ref 1.7–2.4)

## 2021-11-04 LAB — CMP (CANCER CENTER ONLY)
ALT: 28 U/L (ref 0–44)
AST: 42 U/L — ABNORMAL HIGH (ref 15–41)
Albumin: 4.2 g/dL (ref 3.5–5.0)
Alkaline Phosphatase: 84 U/L (ref 38–126)
Anion gap: 7 (ref 5–15)
BUN: 33 mg/dL — ABNORMAL HIGH (ref 8–23)
CO2: 21 mmol/L — ABNORMAL LOW (ref 22–32)
Calcium: 9.4 mg/dL (ref 8.9–10.3)
Chloride: 110 mmol/L (ref 98–111)
Creatinine: 2.84 mg/dL — ABNORMAL HIGH (ref 0.61–1.24)
GFR, Estimated: 22 mL/min — ABNORMAL LOW (ref >60)
Glucose, Bld: 107 mg/dL — ABNORMAL HIGH (ref 70–99)
Potassium: 4.5 mmol/L (ref 3.5–5.1)
Sodium: 138 mmol/L (ref 135–145)
Total Bilirubin: 0.5 mg/dL (ref 0.3–1.2)
Total Protein: 7.8 g/dL (ref 6.5–8.1)

## 2021-11-04 LAB — CBC WITH DIFFERENTIAL (CANCER CENTER ONLY)
Abs Immature Granulocytes: 0 10*3/uL (ref 0.00–0.07)
Basophils Absolute: 0 10*3/uL (ref 0.0–0.1)
Basophils Relative: 1 %
Eosinophils Absolute: 0.5 10*3/uL (ref 0.0–0.5)
Eosinophils Relative: 9 %
HCT: 41.9 % (ref 39.0–52.0)
Hemoglobin: 12.7 g/dL — ABNORMAL LOW (ref 13.0–17.0)
Immature Granulocytes: 0 %
Lymphocytes Relative: 25 %
Lymphs Abs: 1.3 10*3/uL (ref 0.7–4.0)
MCH: 22.5 pg — ABNORMAL LOW (ref 26.0–34.0)
MCHC: 30.3 g/dL (ref 30.0–36.0)
MCV: 74.3 fL — ABNORMAL LOW (ref 80.0–100.0)
Monocytes Absolute: 0.5 10*3/uL (ref 0.1–1.0)
Monocytes Relative: 9 %
Neutro Abs: 2.8 10*3/uL (ref 1.7–7.7)
Neutrophils Relative %: 56 %
Platelet Count: 178 10*3/uL (ref 150–400)
RBC: 5.64 MIL/uL (ref 4.22–5.81)
RDW: 17.5 % — ABNORMAL HIGH (ref 11.5–15.5)
WBC Count: 5 10*3/uL (ref 4.0–10.5)
nRBC: 0 % (ref 0.0–0.2)

## 2021-11-04 NOTE — Progress Notes (Signed)
? ? ? ?HEMATOLOGY/ONCOLOGY CLINIC NOTE ? ?Date of Service: 11/04/2021 ? ? ?Patient Care Team: ?[name], DO as PCP - General (Family Medicine) ? ?CHIEF COMPLAINTS/PURPOSE OF CONSULTATION:  ?F/u for Aaron [name] on Lenvatinib ? ?HISTORY OF PRESENTING ILLNESS:  ? ?Aaron [name] is a wonderful 76 y.o. male who has been referred to [place] by Dr Jani Gravel for evaluation and management of monoclonal gammopathy of undetermined significance. ? ?Patient has history of hypertension, diabetes, dyslipidemia, hepatocellular carcinoma (rx with TACE and percutaneous thermal ablation), hepatitis C status post treatment, iron deficiency anemia in 2014 treated with oral iron. ? ?Patient had an SPEP done by his primary care physician on 10/04/2015 that showed an M spike of 0.3 g/dL. It was presumably will be done due to his complaints of fatigue. He has had no significant anemia. No new bone pains. No fevers/chills/drenching night sweats. ? ?No new anemia..  Outside labs show hemoglobin of 12.4 with microcytosis with an MCV of 70.5, normal WBC count of 5.1k. ? ?No evidence of hypercalcemia or significant renal failure on his outside labs. ? ? ?INTERVAL HISTORY: ? ?Aaron [name] is a 76 y.o. male here for his scheduled follow-up for [name]. The patient's last visit with [place] was on 06/27/2021. The pt reports that he is doing well overall. ? ?The pt reports no new issues or symptoms since the last visit.  ? ?He is having some issues with taking Trulicity and has stopped taking it and he remarks an improved feeling. ? ?He reports having some diarrhea and he expressed having some constipation for a few days before being able to have 2 or 3 bowel movements in one day and the next few days his bowel movements become more soft. He describes his bowel movements as softer or "watery" stools in general. ? ?He reports having some gas and worries about flatulence due to loose stool. ? ?Lab results today -11/04/2021 showed stable hemoglobin of 12.7 with a  normal WBC count and platelets. ?CMP stable with stable creatinine of 2.84. ?Magnesium low 1.8 ?AFP tumor marker 86  ?On review of systems, pt no new abdominal pain, no fevers no chills no night sweats no unexpected weight loss . ? ? ?MEDICAL HISTORY:  ?Past Medical History:  ?Diagnosis Date  ? Arthritis   ? Diabetes mellitus without complication ([place])   ? type II   ? Elevated liver enzymes   ? Fatty liver   ? Hepatitis C   ? genotype 1A status post treatment with Viekira and Ribavarin for 24 weeks  ? Hepatocellular carcinoma ([place]) 01/30/2014  ? Path  ? History of colon polyps   ? Hyperlipidemia   ? Hypertension   ? Iron deficiency anemia   ? hx of   ? MGUS (monoclonal gammopathy of unknown significance)   ? ?Obesity ?Hepatocellular carcinoma treated with TACE and percutaneous thermal ablation by interventional radiology. Last MRI on 08/06/2015 showed slight decrease in the size of the ablation defect involving the right lobe of the liver area and no findings to suggest residual or recurrent hepatocellular carcinoma. Mild changes of liver cirrhosis. ?MRI Abd 06/24/2016- no evidence of recurrent HCC  ? ?Hepatitis C genotype 1A status post treatment with Viekira and Ribavarin for 24 weeks. ? ?Monoclonal gammopathy of undetermined significance. ? ?SURGICAL HISTORY:  ?Status post microwave ablation[October 2015] of liver lesion and TACE [July 2015] for [place]. ?EGD and colonoscopy in 2016 ? ? ?SOCIAL HISTORY: ?Social History  ? ?Socioeconomic History  ? Marital status: Married  ?  Spouse name: Not on file  ? Number of children: Not on file  ? Years of education: Not on file  ? Highest education level: Not on file  ?Occupational History  ? Not on file  ?Tobacco Use  ? Smoking status: Former  ?  Types: Cigarettes  ?  Quit date: 01/30/1994  ?  Years since quitting: 27.7  ? Smokeless tobacco: Never  ? Tobacco comments:  ?  stopped 25 yrs ago  ?Vaping Use  ? Vaping Use: Never used  ?Substance and Sexual Activity  ? Alcohol use: No   ?  Comment: stopped 30 years ago   ? Drug use: No  ? Sexual activity: Not Currently  ?Other Topics Concern  ? Not on file  ?Social History Narrative  ? Not on file  ? ?Social Determinants of Health  ? ?Financial Resource Strain: Not on file  ?Food Insecurity: Not on file  ?Transportation Needs: Not on file  ?Physical Activity: Not on file  ?Stress: Not on file  ?Social Connections: Not on file  ?Intimate Partner Violence: Not on file  ?Former smoker and smoked 1 pack per day for about 20 years starting at age 42 quit 41 years ago. ? ?FAMILY HISTORY: ?No family history on file. ? ?ALLERGIES:  has No Known Allergies. ? ?MEDICATIONS:  ?Current Outpatient Medications  ?Medication Sig Dispense Refill  ? ACCU-CHEK AVIVA PLUS test strip     ? Accu-Chek Softclix Lancets lancets     ? amLODipine (NORVASC) 10 MG tablet Take 1 tablet (10 mg total) by mouth daily. 30 tablet 5  ? aspirin EC 81 MG tablet Take 81 mg by mouth every morning.     ? cholecalciferol (VITAMIN D) 1000 UNITS tablet Take 1,000 Units by mouth every morning.     ? glipiZIDE (GLUCOTROL XL) 2.5 MG 24 hr tablet Take 2.5 mg by mouth daily with breakfast.    ? Insulin Glargine (LANTUS SOLOSTAR) 100 UNIT/ML Solostar Pen Inject 32 Units into the skin daily.    ? lenvatinib 12 mg daily dose (LENVIMA, 12 MG DAILY DOSE,) 3 x 4 MG capsule TAKE 3 CAPSULES ('12MG'$ ) BY MOUTH DAILY. 90 each 2  ? Metoprolol Succinate 50 MG CS24 1 capsule    ? Multiple Vitamin (MULTIVITAMIN WITH MINERALS) TABS tablet Take 1 tablet daily by mouth.    ? oxyCODONE (OXY IR/ROXICODONE) 5 MG immediate release tablet Take 1 tablet (5 mg total) by mouth every 6 (six) hours as needed for severe pain. (Patient not taking: No sig reported) 15 tablet 0  ? oxyCODONE (ROXICODONE) 5 MG immediate release tablet Take 1 tablet (5 mg total) by mouth every 6 (six) hours as needed for severe pain. (Patient not taking: Reported on 06/27/2021) 10 tablet 0  ? PAZEO 0.7 % SOLN Place 1 drop into both eyes every  morning.  (Patient not taking: No sig reported)  3  ? simvastatin (ZOCOR) 20 MG tablet Take 20 mg by mouth daily.    ? ?Current Facility-Administered Medications  ?Medication Dose Route Frequency Provider Last Rate Last Admin  ? oxyCODONE (Oxy IR/ROXICODONE) immediate release tablet 5 mg  5 mg Oral Q6H PRN Jacqualine Mau, NP      ? ? ?REVIEW OF SYSTEMS:   ?.10 Point review of Systems was done is negative except as noted above. ? ?PHYSICAL EXAMINATION: ?ECOG FS:1 - Symptomatic but completely ambulatory ? ?Vitals:  ? 11/04/21 1356  ?BP: (!) 145/78  ?Pulse: 68  ?Resp: 18  ?Temp: 97.8 ?  F (36.6 ?C)  ?SpO2: 99%  ? ?Wt Readings from Last 3 Encounters:  ?11/04/21 227 lb 8 oz (103.2 kg)  ?06/27/21 243 lb 6.4 oz (110.4 kg)  ?05/16/21 245 lb 3.2 oz (111.2 kg)  ? ?Body mass index is 30.85 kg/m?.   ?. ? ?GENERAL:alert, in no acute distress and comfortable ?SKIN: no acute rashes, no significant lesions ?EYES: conjunctiva are pink and non-injected, sclera anicteric ?NECK: supple, no JVD ?LYMPH:  no palpable lymphadenopathy in the cervical, axillary or inguinal regions ?LUNGS: clear to auscultation b/l with normal respiratory effort ?HEART: regular rate & rhythm ?ABDOMEN:  normoactive bowel sounds , non tender, not distended. ?Extremity: no pedal edema ?PSYCH: alert & oriented x 3 with fluent speech ?NEURO: no focal motor/sensory deficits ? ?LABORATORY DATA:  ?I have reviewed the data as listed ? ?CBC Latest Ref Rng & Units 11/04/2021 09/08/2021 06/27/2021  ?WBC 4.0 - 10.5 K/uL 5.0 5.4 7.4  ?Hemoglobin 13.0 - 17.0 g/dL 12.7(L) 11.7(L) 11.9(L)  ?Hematocrit 39.0 - 52.0 % 41.9 38.6(L) 38.8(L)  ?Platelets 150 - 400 K/uL 178 221 282  ? ? ?CBC ?   ?Component Value Date/Time  ? WBC 5.0 11/04/2021 1326  ? WBC 5.4 09/08/2021 0733  ? RBC 5.64 11/04/2021 1326  ? HGB 12.7 (L) 11/04/2021 1326  ? HGB 11.5 (L) 03/22/2017 1402  ? HCT 41.9 11/04/2021 1326  ? HCT 37.0 (L) 03/22/2017 1402  ? PLT 178 11/04/2021 1326  ? PLT 262 03/22/2017 1402  ?  MCV 74.3 (L) 11/04/2021 1326  ? MCV 72.3 (L) 03/22/2017 1402  ? MCH 22.5 (L) 11/04/2021 1326  ? MCHC 30.3 11/04/2021 1326  ? RDW 17.5 (H) 11/04/2021 1326  ? RDW 15.6 (H) 03/22/2017 1402  ? LYMPHSABS 1.3 11/04/2021

## 2021-11-05 ENCOUNTER — Telehealth: Payer: Self-pay | Admitting: Hematology

## 2021-11-05 LAB — AFP TUMOR MARKER: AFP, Serum, Tumor Marker: 86 ng/mL — ABNORMAL HIGH (ref 0.0–8.4)

## 2021-11-05 NOTE — Telephone Encounter (Signed)
Left message with follow-up appointments per 3/14 los. ?

## 2021-11-10 ENCOUNTER — Encounter: Payer: Self-pay | Admitting: Hematology

## 2021-11-11 ENCOUNTER — Other Ambulatory Visit (HOSPITAL_COMMUNITY): Payer: Self-pay

## 2021-11-12 DIAGNOSIS — D472 Monoclonal gammopathy: Secondary | ICD-10-CM | POA: Diagnosis not present

## 2021-11-12 DIAGNOSIS — E1122 Type 2 diabetes mellitus with diabetic chronic kidney disease: Secondary | ICD-10-CM | POA: Diagnosis not present

## 2021-11-12 DIAGNOSIS — E1129 Type 2 diabetes mellitus with other diabetic kidney complication: Secondary | ICD-10-CM | POA: Diagnosis not present

## 2021-11-12 DIAGNOSIS — I129 Hypertensive chronic kidney disease with stage 1 through stage 4 chronic kidney disease, or unspecified chronic kidney disease: Secondary | ICD-10-CM | POA: Diagnosis not present

## 2021-11-12 DIAGNOSIS — N184 Chronic kidney disease, stage 4 (severe): Secondary | ICD-10-CM | POA: Diagnosis not present

## 2021-11-12 DIAGNOSIS — C220 Liver cell carcinoma: Secondary | ICD-10-CM | POA: Diagnosis not present

## 2021-11-12 DIAGNOSIS — R809 Proteinuria, unspecified: Secondary | ICD-10-CM | POA: Diagnosis not present

## 2021-11-14 ENCOUNTER — Other Ambulatory Visit (HOSPITAL_COMMUNITY): Payer: Self-pay

## 2021-11-17 ENCOUNTER — Other Ambulatory Visit: Payer: Self-pay | Admitting: Hematology

## 2021-11-17 ENCOUNTER — Other Ambulatory Visit (HOSPITAL_COMMUNITY): Payer: Self-pay

## 2021-11-17 MED ORDER — LENVIMA (12 MG DAILY DOSE) 3 X 4 MG PO CPPK
ORAL_CAPSULE | ORAL | 2 refills | Status: DC
  Filled 2021-11-18 – 2021-11-25 (×2): qty 90, 30d supply, fill #0
  Filled 2021-12-15: qty 90, 30d supply, fill #1
  Filled 2022-01-16: qty 90, 30d supply, fill #2

## 2021-11-18 ENCOUNTER — Other Ambulatory Visit (HOSPITAL_COMMUNITY): Payer: Self-pay

## 2021-11-25 ENCOUNTER — Ambulatory Visit
Admission: RE | Admit: 2021-11-25 | Discharge: 2021-11-25 | Disposition: A | Discharge status: Home / Self Care | Payer: Medicare HMO | Source: Ambulatory Visit | Attending: Radiology | Admitting: Radiology

## 2021-11-25 ENCOUNTER — Other Ambulatory Visit (HOSPITAL_COMMUNITY): Payer: Self-pay

## 2021-11-25 ENCOUNTER — Encounter: Payer: Self-pay | Admitting: *Deleted

## 2021-11-25 DIAGNOSIS — C220 Liver cell carcinoma: Secondary | ICD-10-CM | POA: Diagnosis not present

## 2021-11-25 DIAGNOSIS — Z9889 Other specified postprocedural states: Secondary | ICD-10-CM | POA: Diagnosis not present

## 2021-11-25 HISTORY — PX: IR RADIOLOGIST EVAL & MGMT: IMG5224

## 2021-11-25 NOTE — Progress Notes (Signed)
? ? ?Chief Complaint: ?Patient was consulted remotely today (TeleHealth) for follow-up hepatocellular carcinoma. ? ?Referring Physician(s): ?[name] ? ?History of Present Illness: ?Aaron [name] is a 76 y.o. male with multifocal hepatocellular carcinoma that I have been treating for multiple years.  Patient has had multiple liver directed therapies including Y90 radioembolization, chemoembolization and microwave ablation.  Patient has undergone Y90 radioembolization to the right hepatic lobe on 06/18/2020, Y90 radioembolization into the central left hepatic lobe on 03/01/2020 and had Y90 radioembolization to the lateral left hepatic artery segments on 11/19/2020.  Most recently, the patient had a bland embolization to left hepatic artery branches on 06/06/2021.  AFP level has slightly increased to 86 from 54.2.  Patient had an MRI of the liver on 10/04/2021 that demonstrates a mixed response to treatment with at least 1 worsening lesion in segment 4A.  Patient remains active but he is not walking on a daily basis like he used to.  He says that his appetite has been poor since we treated his central left hepatic lobe.  He had significant diarrhea earlier in the year which appear to be related to Trulicity.  He is no longer on the Trulicity.  He recently saw Dr. Royce Macadamia for his renal insufficiency.  Patient was noted to have an elevated creatinine on recent labs.  He says that he is trying to hydrate regularly. ? ?Past Medical History:  ?Diagnosis Date  ? Arthritis   ? Diabetes mellitus without complication ([place])   ? type II   ? Elevated liver enzymes   ? Fatty liver   ? Hepatitis C   ? genotype 1A status post treatment with Viekira and Ribavarin for 24 weeks  ? Hepatocellular carcinoma ([place]) 01/30/2014  ? Path  ? History of colon polyps   ? Hyperlipidemia   ? Hypertension   ? Iron deficiency anemia   ? hx of   ? MGUS (monoclonal gammopathy of unknown significance)   ? ? ?Past Surgical History:  ?Procedure  Laterality Date  ? COLONOSCOPY    ? ESOPHAGOGASTRODUODENOSCOPY  2016  ? IR ANGIOGRAM SELECTIVE EACH ADDITIONAL VESSEL  04/04/2019  ? IR ANGIOGRAM SELECTIVE EACH ADDITIONAL VESSEL  04/04/2019  ? IR ANGIOGRAM SELECTIVE EACH ADDITIONAL VESSEL  04/04/2019  ? IR ANGIOGRAM SELECTIVE EACH ADDITIONAL VESSEL  04/04/2019  ? IR ANGIOGRAM SELECTIVE EACH ADDITIONAL VESSEL  04/04/2019  ? IR ANGIOGRAM SELECTIVE EACH ADDITIONAL VESSEL  04/04/2019  ? IR ANGIOGRAM SELECTIVE EACH ADDITIONAL VESSEL  04/04/2019  ? IR ANGIOGRAM SELECTIVE EACH ADDITIONAL VESSEL  05/16/2019  ? IR ANGIOGRAM SELECTIVE EACH ADDITIONAL VESSEL  05/16/2019  ? IR ANGIOGRAM SELECTIVE EACH ADDITIONAL VESSEL  05/16/2019  ? IR ANGIOGRAM SELECTIVE EACH ADDITIONAL VESSEL  02/14/2020  ? IR ANGIOGRAM SELECTIVE EACH ADDITIONAL VESSEL  02/14/2020  ? IR ANGIOGRAM SELECTIVE EACH ADDITIONAL VESSEL  02/14/2020  ? IR ANGIOGRAM SELECTIVE EACH ADDITIONAL VESSEL  02/14/2020  ? IR ANGIOGRAM SELECTIVE EACH ADDITIONAL VESSEL  03/01/2020  ? IR ANGIOGRAM SELECTIVE EACH ADDITIONAL VESSEL  03/01/2020  ? IR ANGIOGRAM SELECTIVE EACH ADDITIONAL VESSEL  03/01/2020  ? IR ANGIOGRAM SELECTIVE EACH ADDITIONAL VESSEL  03/01/2020  ? IR ANGIOGRAM SELECTIVE EACH ADDITIONAL VESSEL  03/01/2020  ? IR ANGIOGRAM SELECTIVE EACH ADDITIONAL VESSEL  06/18/2020  ? IR ANGIOGRAM SELECTIVE EACH ADDITIONAL VESSEL  06/18/2020  ? IR ANGIOGRAM SELECTIVE EACH ADDITIONAL VESSEL  11/19/2020  ? IR ANGIOGRAM SELECTIVE EACH ADDITIONAL VESSEL  11/19/2020  ? IR ANGIOGRAM SELECTIVE EACH ADDITIONAL VESSEL  06/06/2021  ? IR ANGIOGRAM SELECTIVE  EACH ADDITIONAL VESSEL  06/06/2021  ? IR ANGIOGRAM SELECTIVE EACH ADDITIONAL VESSEL  06/06/2021  ? IR ANGIOGRAM SELECTIVE EACH ADDITIONAL VESSEL  06/06/2021  ? IR ANGIOGRAM VISCERAL SELECTIVE  04/04/2019  ? IR ANGIOGRAM VISCERAL SELECTIVE  05/16/2019  ? IR ANGIOGRAM VISCERAL SELECTIVE  02/14/2020  ? IR ANGIOGRAM VISCERAL SELECTIVE  02/14/2020  ? IR ANGIOGRAM VISCERAL SELECTIVE  03/01/2020  ? IR ANGIOGRAM VISCERAL  SELECTIVE  06/18/2020  ? IR ANGIOGRAM VISCERAL SELECTIVE  11/19/2020  ? IR EMBO ARTERIAL NOT HEMORR HEMANG INC GUIDE ROADMAPPING  02/14/2020  ? IR EMBO TUMOR ORGAN ISCHEMIA INFARCT INC GUIDE ROADMAPPING  04/04/2019  ? IR EMBO TUMOR ORGAN ISCHEMIA INFARCT INC GUIDE ROADMAPPING  05/16/2019  ? IR EMBO TUMOR ORGAN ISCHEMIA INFARCT INC GUIDE ROADMAPPING  03/01/2020  ? IR EMBO TUMOR ORGAN ISCHEMIA INFARCT INC GUIDE ROADMAPPING  06/18/2020  ? IR EMBO TUMOR ORGAN ISCHEMIA INFARCT INC GUIDE ROADMAPPING  11/19/2020  ? IR EMBO TUMOR ORGAN ISCHEMIA INFARCT INC GUIDE ROADMAPPING  06/06/2021  ? IR GENERIC HISTORICAL  02/26/2016  ? IR RADIOLOGIST EVAL & MGMT 02/26/2016 Markus Daft, MD [facility] INTERV RAD  ? IR GENERIC HISTORICAL  01/16/2016  ? IR RADIOLOGIST EVAL & MGMT 01/16/2016 Markus Daft, MD [facility] INTERV RAD  ? IR GENERIC HISTORICAL  06/24/2016  ? IR RADIOLOGIST EVAL & MGMT 06/24/2016 Aletta Edouard, MD [facility] INTERV RAD  ? IR RADIOLOGIST EVAL & MGMT  12/17/2016  ? IR RADIOLOGIST EVAL & MGMT  06/16/2017  ? IR RADIOLOGIST EVAL & MGMT  09/08/2017  ? IR RADIOLOGIST EVAL & MGMT  10/26/2017  ? IR RADIOLOGIST EVAL & MGMT  05/12/2018  ? IR RADIOLOGIST EVAL & MGMT  07/26/2018  ? IR RADIOLOGIST EVAL & MGMT  03/22/2019  ? IR RADIOLOGIST EVAL & MGMT  04/11/2019  ? IR RADIOLOGIST EVAL & MGMT  06/28/2019  ? IR RADIOLOGIST EVAL & MGMT  10/10/2019  ? IR RADIOLOGIST EVAL & MGMT  05/21/2020  ? IR RADIOLOGIST EVAL & MGMT  06/12/2020  ? IR RADIOLOGIST EVAL & MGMT  10/24/2020  ? IR RADIOLOGIST EVAL & MGMT  12/11/2020  ? IR RADIOLOGIST EVAL & MGMT  02/27/2021  ? IR US GUIDE VASC ACCESS LEFT  05/16/2019  ? IR US GUIDE VASC ACCESS RIGHT  04/04/2019  ? IR US GUIDE VASC ACCESS RIGHT  02/14/2020  ? IR US GUIDE VASC ACCESS RIGHT  03/01/2020  ? IR US GUIDE VASC ACCESS RIGHT  06/18/2020  ? IR US GUIDE VASC ACCESS RIGHT  11/19/2020  ? IR US GUIDE VASC ACCESS RIGHT  06/06/2021  ? POLYPECTOMY    ? RADIOFREQUENCY ABLATION N/A 07/21/2017  ? Procedure: CT MICROWAVE THERMAL ABLATION-LIVER;  Surgeon:  Markus Daft, MD;  Location: [facility];  Service: Anesthesiology;  Laterality: N/A;  ? RADIOFREQUENCY ABLATION N/A 06/29/2018  ? Procedure: CT MICROWAVE THERMAL ABLATION;  Surgeon: Markus Daft, MD;  Location: [facility];  Service: Anesthesiology;  Laterality: N/A;  ? ? ?Allergies: ?Patient has no known allergies. ? ?Medications: ?Prior to Admission medications   ?Medication Sig Start Date End Date Taking? Authorizing Provider  ?ACCU-CHEK AVIVA PLUS test strip  11/19/19   [provider]  ?Accu-Chek Softclix Lancets lancets  09/12/19   [provider]  ?amLODipine (NORVASC) 10 MG tablet Take 1 tablet (10 mg total) by mouth daily. 06/27/21   Brunetta Genera, MD  ?aspirin EC 81 MG tablet Take 81 mg by mouth every morning.     [provider]  ?cholecalciferol (VITAMIN D)  1000 UNITS tablet Take 1,000 Units by mouth every morning.     [provider]  ?glipiZIDE (GLUCOTROL XL) 2.5 MG 24 hr tablet Take 2.5 mg by mouth daily with breakfast.    [provider]  ?Insulin Glargine (LANTUS SOLOSTAR) 100 UNIT/ML Solostar Pen Inject 32 Units into the skin daily.    [provider]  ?lenvatinib 12 mg daily dose (LENVIMA, 12 MG DAILY DOSE,) 3 x 4 MG capsule TAKE 3 CAPSULES ('12MG'$ ) BY MOUTH DAILY. 11/17/21 11/17/22  Brunetta Genera, MD  ?Metoprolol Succinate 50 MG CS24 1 capsule    [provider]  ?Multiple Vitamin (MULTIVITAMIN WITH MINERALS) TABS tablet Take 1 tablet daily by mouth.    [provider]  ?oxyCODONE (OXY IR/ROXICODONE) 5 MG immediate release tablet Take 1 tablet (5 mg total) by mouth every 6 (six) hours as needed for severe pain. ?Patient not taking: Reported on 02/26/2021 03/01/20   Docia Barrier, PA  ?PAZEO 0.7 % SOLN Place 1 drop into both eyes every morning.  ?Patient not taking: Reported on 02/26/2021 05/09/18   [provider]  ?simvastatin (ZOCOR) 20 MG tablet Take 20 mg by mouth daily.    [provider]  ?  ? ?No family  history on file. ? ?Social History  ? ?Socioeconomic History  ? Marital status: Married  ?  Spouse name: Not on file  ? Number of children: Not on file  ? Years of education: Not on file  ? Highest education level:

## 2021-12-01 ENCOUNTER — Other Ambulatory Visit: Payer: Self-pay | Admitting: *Deleted

## 2021-12-01 DIAGNOSIS — C220 Liver cell carcinoma: Secondary | ICD-10-CM

## 2021-12-01 DIAGNOSIS — Z5112 Encounter for antineoplastic immunotherapy: Secondary | ICD-10-CM

## 2021-12-02 ENCOUNTER — Inpatient Hospital Stay: Payer: Medicare HMO | Attending: Hematology

## 2021-12-02 ENCOUNTER — Other Ambulatory Visit: Payer: Self-pay

## 2021-12-02 DIAGNOSIS — Z5112 Encounter for antineoplastic immunotherapy: Secondary | ICD-10-CM

## 2021-12-02 DIAGNOSIS — Z8505 Personal history of malignant neoplasm of liver: Secondary | ICD-10-CM | POA: Insufficient documentation

## 2021-12-02 DIAGNOSIS — D649 Anemia, unspecified: Secondary | ICD-10-CM | POA: Insufficient documentation

## 2021-12-02 DIAGNOSIS — C220 Liver cell carcinoma: Secondary | ICD-10-CM

## 2021-12-02 DIAGNOSIS — D472 Monoclonal gammopathy: Secondary | ICD-10-CM | POA: Diagnosis not present

## 2021-12-02 LAB — CBC WITH DIFFERENTIAL (CANCER CENTER ONLY)
Abs Immature Granulocytes: 0.01 10*3/uL (ref 0.00–0.07)
Basophils Absolute: 0 10*3/uL (ref 0.0–0.1)
Basophils Relative: 1 %
Eosinophils Absolute: 0.2 10*3/uL (ref 0.0–0.5)
Eosinophils Relative: 4 %
HCT: 39.6 % (ref 39.0–52.0)
Hemoglobin: 12.1 g/dL — ABNORMAL LOW (ref 13.0–17.0)
Immature Granulocytes: 0 %
Lymphocytes Relative: 25 %
Lymphs Abs: 1.2 10*3/uL (ref 0.7–4.0)
MCH: 22.8 pg — ABNORMAL LOW (ref 26.0–34.0)
MCHC: 30.6 g/dL (ref 30.0–36.0)
MCV: 74.7 fL — ABNORMAL LOW (ref 80.0–100.0)
Monocytes Absolute: 0.5 10*3/uL (ref 0.1–1.0)
Monocytes Relative: 11 %
Neutro Abs: 2.8 10*3/uL (ref 1.7–7.7)
Neutrophils Relative %: 59 %
Platelet Count: 159 10*3/uL (ref 150–400)
RBC: 5.3 MIL/uL (ref 4.22–5.81)
RDW: 16.1 % — ABNORMAL HIGH (ref 11.5–15.5)
WBC Count: 4.7 10*3/uL (ref 4.0–10.5)
nRBC: 0 % (ref 0.0–0.2)

## 2021-12-02 LAB — CMP (CANCER CENTER ONLY)
ALT: 24 U/L (ref 0–44)
AST: 37 U/L (ref 15–41)
Albumin: 4 g/dL (ref 3.5–5.0)
Alkaline Phosphatase: 82 U/L (ref 38–126)
Anion gap: 6 (ref 5–15)
BUN: 24 mg/dL — ABNORMAL HIGH (ref 8–23)
CO2: 26 mmol/L (ref 22–32)
Calcium: 8.8 mg/dL — ABNORMAL LOW (ref 8.9–10.3)
Chloride: 106 mmol/L (ref 98–111)
Creatinine: 2.07 mg/dL — ABNORMAL HIGH (ref 0.61–1.24)
GFR, Estimated: 33 mL/min — ABNORMAL LOW (ref >60)
Glucose, Bld: 88 mg/dL (ref 70–99)
Potassium: 4.1 mmol/L (ref 3.5–5.1)
Sodium: 138 mmol/L (ref 135–145)
Total Bilirubin: 0.6 mg/dL (ref 0.3–1.2)
Total Protein: 7.8 g/dL (ref 6.5–8.1)

## 2021-12-02 LAB — MAGNESIUM: Magnesium: 1.6 mg/dL — ABNORMAL LOW (ref 1.7–2.4)

## 2021-12-09 ENCOUNTER — Inpatient Hospital Stay (HOSPITAL_BASED_OUTPATIENT_CLINIC_OR_DEPARTMENT_OTHER): Payer: Medicare HMO | Admitting: Hematology

## 2021-12-09 DIAGNOSIS — C220 Liver cell carcinoma: Secondary | ICD-10-CM

## 2021-12-09 NOTE — Progress Notes (Signed)
? ? ? ?HEMATOLOGY/ONCOLOGY PHONE VISIT NOTE ? ?Date of Service: 12/09/2021 ? ? ?Patient Care Team: ?[name], DO as PCP - General (Family Medicine) ? ?CHIEF COMPLAINTS/PURPOSE OF CONSULTATION:  ?F/u continued evaluation and management for [name] on Lenvatinib ? ?HISTORY OF PRESENTING ILLNESS:  ?Please see previous note for details on initial presentation ? ?INTERVAL HISTORY: ? ?I connected with Aaron [name] on 12/09/2021 at 2:40PM EST by telephone visit and verified that I am speaking with the correct person using two identifiers.  ? ?I discussed the limitations, risks, security and privacy concerns of performing an evaluation and management service by telemedicine and the availability of in-person appointments. I also discussed with the patient that there may be a patient responsible charge related to this service. The patient expressed understanding and agreed to proceed.  ? ?Other persons participating in the visit and their role in the encounter: None ? ?Patient?s location: Home ?Provider?s location: [place] ? ?I connected with Aaron [name] who is a 76 y.o. male via telephone for follow-up for [name]. He reports He is doing well with no new symptoms or concerns. ? ?He notes that he his loose stools have improved after stopping the Trulicity. ? ?He reports an improved appetite. ? ?He notes that he has been able to do more things around the house and feels improvement. ? ?No fever, chills, night sweats. ?No new lumps, bumps, or lesions/rashes. ?No abdominal pain or change in bowel habits. ?No new or unexpected weight loss. ?No other new or acute focal symptoms. ? ?Lab results 12/02/2021 showed stable hemoglobin of 12.1 with a normal WBC count and platelets. ?CMP stable with stable creatinine of 2.07. ?Magnesium low 1.6 ?AFP tumor marker done 11/04/2021 is 86 ? ?MEDICAL HISTORY:  ?Past Medical History:  ?Diagnosis Date  ? Arthritis   ? Diabetes mellitus without complication ([place])   ? type II   ?  Elevated liver enzymes   ? Fatty liver   ? Hepatitis C   ? genotype 1A status post treatment with Viekira and Ribavarin for 24 weeks  ? Hepatocellular carcinoma ([place]) 01/30/2014  ? Path  ? History of colon polyps   ? Hyperlipidemia   ? Hypertension   ? Iron deficiency anemia   ? hx of   ? MGUS (monoclonal gammopathy of unknown significance)   ? ?Obesity ?Hepatocellular carcinoma treated with TACE and percutaneous thermal ablation by interventional radiology. Last MRI on 08/06/2015 showed slight decrease in the size of the ablation defect involving the right lobe of the liver area and no findings to suggest residual or recurrent hepatocellular carcinoma. Mild changes of liver cirrhosis. ?MRI Abd 06/24/2016- no evidence of recurrent HCC  ? ?Hepatitis C genotype 1A status post treatment with Viekira and Ribavarin for 24 weeks. ? ?Monoclonal gammopathy of undetermined significance. ? ?SURGICAL HISTORY:  ?Status post microwave ablation[October 2015] of liver lesion and TACE [July 2015] for [place]. ?EGD and colonoscopy in 2016 ? ? ?SOCIAL HISTORY: ?Social History  ? ?Socioeconomic History  ? Marital status: Married  ?  Spouse name: Not on file  ? Number of children: Not on file  ? Years of education: Not on file  ? Highest education level: Not on file  ?Occupational History  ? Not on file  ?Tobacco Use  ? Smoking status: Former  ?  Types: Cigarettes  ?  Quit date: 01/30/1994  ?  Years since quitting: 27.8  ? Smokeless tobacco: Never  ? Tobacco comments:  ?  stopped 25 yrs  ago  ?Vaping Use  ? Vaping Use: Never used  ?Substance and Sexual Activity  ? Alcohol use: No  ?  Comment: stopped 30 years ago   ? Drug use: No  ? Sexual activity: Not Currently  ?Other Topics Concern  ? Not on file  ?Social History Narrative  ? Not on file  ? ?Social Determinants of Health  ? ?Financial Resource Strain: Not on file  ?Food Insecurity: Not on file  ?Transportation Needs: Not on file  ?Physical Activity: Not on file  ?Stress: Not on file  ?Social  Connections: Not on file  ?Intimate Partner Violence: Not on file  ?Former smoker and smoked 1 pack per day for about 20 years starting at age 58 quit 53 years ago. ? ?FAMILY HISTORY: ?No family history on file. ? ?ALLERGIES:  has No Known Allergies. ? ?MEDICATIONS:  ?Current Outpatient Medications  ?Medication Sig Dispense Refill  ? ACCU-CHEK AVIVA PLUS test strip     ? Accu-Chek Softclix Lancets lancets     ? amLODipine (NORVASC) 10 MG tablet Take 1 tablet (10 mg total) by mouth daily. 30 tablet 5  ? aspirin EC 81 MG tablet Take 81 mg by mouth every morning.     ? cholecalciferol (VITAMIN D) 1000 UNITS tablet Take 1,000 Units by mouth every morning.     ? glipiZIDE (GLUCOTROL XL) 2.5 MG 24 hr tablet Take 2.5 mg by mouth daily with breakfast.    ? Insulin Glargine (LANTUS SOLOSTAR) 100 UNIT/ML Solostar Pen Inject 32 Units into the skin daily.    ? lenvatinib 12 mg daily dose (LENVIMA, 12 MG DAILY DOSE,) 3 x 4 MG capsule TAKE 3 CAPSULES ('12MG'$ ) BY MOUTH DAILY. 90 each 2  ? Metoprolol Succinate 50 MG CS24 1 capsule    ? Multiple Vitamin (MULTIVITAMIN WITH MINERALS) TABS tablet Take 1 tablet daily by mouth.    ? oxyCODONE (OXY IR/ROXICODONE) 5 MG immediate release tablet Take 1 tablet (5 mg total) by mouth every 6 (six) hours as needed for severe pain. (Patient not taking: Reported on 02/26/2021) 15 tablet 0  ? PAZEO 0.7 % SOLN Place 1 drop into both eyes every morning.  (Patient not taking: Reported on 02/26/2021)  3  ? simvastatin (ZOCOR) 20 MG tablet Take 20 mg by mouth daily.    ? ?Current Facility-Administered Medications  ?Medication Dose Route Frequency Provider Last Rate Last Admin  ? oxyCODONE (Oxy IR/ROXICODONE) immediate release tablet 5 mg  5 mg Oral Q6H PRN Jacqualine Mau, NP      ? ? ?REVIEW OF SYSTEMS:   ?.10 Point review of Systems was done is negative except as noted above. ? ?PHYSICAL EXAMINATION: ? ?Telemedicine appointment ? ?LABORATORY DATA:  ?I have reviewed the data as listed ? ? ?  Latest Ref  Rng & Units 12/02/2021  ?  2:17 PM 11/04/2021  ?  1:26 PM 09/08/2021  ?  7:33 AM  ?CBC  ?WBC 4.0 - 10.5 K/uL 4.7   5.0   5.4    ?Hemoglobin 13.0 - 17.0 g/dL 12.1   12.7   11.7    ?Hematocrit 39.0 - 52.0 % 39.6   41.9   38.6    ?Platelets 150 - 400 K/uL 159   178   221    ? ? ?CBC ?   ?Component Value Date/Time  ? WBC 4.7 12/02/2021 1417  ? WBC 5.4 09/08/2021 0733  ? RBC 5.30 12/02/2021 1417  ? HGB 12.1 (L) 12/02/2021 1417  ?  HGB 11.5 (L) 03/22/2017 1402  ? HCT 39.6 12/02/2021 1417  ? HCT 37.0 (L) 03/22/2017 1402  ? PLT 159 12/02/2021 1417  ? PLT 262 03/22/2017 1402  ? MCV 74.7 (L) 12/02/2021 1417  ? MCV 72.3 (L) 03/22/2017 1402  ? MCH 22.8 (L) 12/02/2021 1417  ? MCHC 30.6 12/02/2021 1417  ? RDW 16.1 (H) 12/02/2021 1417  ? RDW 15.6 (H) 03/22/2017 1402  ? LYMPHSABS 1.2 12/02/2021 1417  ? LYMPHSABS 1.8 03/22/2017 1402  ? MONOABS 0.5 12/02/2021 1417  ? MONOABS 0.4 03/22/2017 1402  ? EOSABS 0.2 12/02/2021 1417  ? EOSABS 0.1 03/22/2017 1402  ? BASOSABS 0.0 12/02/2021 1417  ? BASOSABS 0.0 03/22/2017 1402  ? ? ?. ? ?  Latest Ref Rng & Units 12/02/2021  ?  2:17 PM 11/04/2021  ?  1:26 PM 09/08/2021  ?  7:33 AM  ?CMP  ?Glucose 70 - 99 mg/dL 88   107   104    ?BUN 8 - 23 mg/dL 24   33   25    ?Creatinine 0.61 - 1.24 mg/dL 2.07   2.84   2.17    ?Sodium 135 - 145 mmol/L 138   138   138    ?Potassium 3.5 - 5.1 mmol/L 4.1   4.5   4.2    ?Chloride 98 - 111 mmol/L 106   110   106    ?CO2 22 - 32 mmol/L '26   21   23    '$ ?Calcium 8.9 - 10.3 mg/dL 8.8   9.4   9.0    ?Total Protein 6.5 - 8.1 g/dL 7.8   7.8   8.3    ?Total Bilirubin 0.3 - 1.2 mg/dL 0.6   0.5   0.5    ?Alkaline Phos 38 - 126 U/L 82   84   83    ?AST 15 - 41 U/L 37   42   33    ?ALT 0 - 44 U/L '24   28   26    '$ ? ? ? ? ?06/21/2019 MR ABDOMEN WWO CONTRAST (Accession [id number]) ? ? ? ? ?IFE 1  Comment    ?Comments: Immunofixation shows IgG monoclonal protein with kappa light chain  ?specificity.    ?  ?  ? ? ? ?RADIOGRAPHIC STUDIES: ? ?MRI abd w and wo contrast: 12/17/2016:  IMPRESSION: ?1. Postprocedural changes of thermal ablation again noted in the ?right lobe of the liver, without definitive evidence to suggest ?local recurrence of disease. No new hepatic lesions are noted. ?2

## 2021-12-10 ENCOUNTER — Telehealth: Payer: Self-pay | Admitting: Hematology

## 2021-12-10 NOTE — Telephone Encounter (Signed)
Left message with follow-up appointment per 4/18 los. ?

## 2021-12-11 ENCOUNTER — Encounter: Payer: Self-pay | Admitting: Hematology

## 2021-12-15 ENCOUNTER — Other Ambulatory Visit (HOSPITAL_COMMUNITY): Payer: Self-pay

## 2021-12-18 ENCOUNTER — Other Ambulatory Visit (HOSPITAL_COMMUNITY): Payer: Self-pay | Admitting: Diagnostic Radiology

## 2021-12-18 DIAGNOSIS — C220 Liver cell carcinoma: Secondary | ICD-10-CM

## 2021-12-24 ENCOUNTER — Other Ambulatory Visit (HOSPITAL_COMMUNITY): Payer: Self-pay

## 2022-01-01 ENCOUNTER — Other Ambulatory Visit: Payer: Self-pay | Admitting: Diagnostic Radiology

## 2022-01-01 DIAGNOSIS — C220 Liver cell carcinoma: Secondary | ICD-10-CM

## 2022-01-16 ENCOUNTER — Other Ambulatory Visit (HOSPITAL_COMMUNITY): Payer: Self-pay

## 2022-01-20 ENCOUNTER — Other Ambulatory Visit (HOSPITAL_COMMUNITY): Payer: Self-pay

## 2022-01-20 DIAGNOSIS — N1832 Chronic kidney disease, stage 3b: Secondary | ICD-10-CM | POA: Diagnosis not present

## 2022-01-20 NOTE — Progress Notes (Signed)
Contacted pt per Dr Irene Limbo regarding taking Lenvima: he can hold it now and for 2 weeks post Y-90 treatment . Pt to restart on 02/06/22. Pt acknowledged and verbalized understanding.

## 2022-01-21 ENCOUNTER — Ambulatory Visit (HOSPITAL_COMMUNITY): Admission: RE | Admit: 2022-01-21 | Payer: Medicare HMO | Source: Ambulatory Visit

## 2022-01-22 ENCOUNTER — Other Ambulatory Visit (HOSPITAL_COMMUNITY): Payer: Self-pay

## 2022-01-22 ENCOUNTER — Other Ambulatory Visit: Payer: Self-pay | Admitting: Student

## 2022-01-22 DIAGNOSIS — C220 Liver cell carcinoma: Secondary | ICD-10-CM

## 2022-01-23 ENCOUNTER — Encounter (HOSPITAL_COMMUNITY)
Admission: RE | Admit: 2022-01-23 | Discharge: 2022-01-23 | Disposition: A | Discharge status: Home / Self Care | Payer: Medicare HMO | Source: Ambulatory Visit | Attending: Diagnostic Radiology | Admitting: Diagnostic Radiology

## 2022-01-23 ENCOUNTER — Encounter (HOSPITAL_COMMUNITY): Payer: Self-pay

## 2022-01-23 ENCOUNTER — Ambulatory Visit (HOSPITAL_COMMUNITY)
Admission: RE | Admit: 2022-01-23 | Discharge: 2022-01-23 | Disposition: A | Discharge status: Home / Self Care | Payer: Medicare HMO | Source: Ambulatory Visit | Attending: Diagnostic Radiology | Admitting: Diagnostic Radiology

## 2022-01-23 ENCOUNTER — Other Ambulatory Visit (HOSPITAL_COMMUNITY): Payer: Self-pay | Admitting: Diagnostic Radiology

## 2022-01-23 DIAGNOSIS — N289 Disorder of kidney and ureter, unspecified: Secondary | ICD-10-CM | POA: Diagnosis not present

## 2022-01-23 DIAGNOSIS — C220 Liver cell carcinoma: Secondary | ICD-10-CM | POA: Insufficient documentation

## 2022-01-23 DIAGNOSIS — Z7984 Long term (current) use of oral hypoglycemic drugs: Secondary | ICD-10-CM | POA: Insufficient documentation

## 2022-01-23 DIAGNOSIS — E119 Type 2 diabetes mellitus without complications: Secondary | ICD-10-CM | POA: Insufficient documentation

## 2022-01-23 DIAGNOSIS — Z794 Long term (current) use of insulin: Secondary | ICD-10-CM | POA: Diagnosis not present

## 2022-01-23 HISTORY — PX: IR 3D INDEPENDENT WKST: IMG2385

## 2022-01-23 HISTORY — DX: Dyspnea, unspecified: R06.00

## 2022-01-23 HISTORY — PX: IR ANGIOGRAM VISCERAL SELECTIVE: IMG657

## 2022-01-23 HISTORY — PX: IR ANGIOGRAM SELECTIVE EACH ADDITIONAL VESSEL: IMG667

## 2022-01-23 HISTORY — PX: IR US GUIDE VASC ACCESS RIGHT: IMG2390

## 2022-01-23 LAB — COMPREHENSIVE METABOLIC PANEL
ALT: 28 U/L (ref 0–44)
AST: 42 U/L — ABNORMAL HIGH (ref 15–41)
Albumin: 3.9 g/dL (ref 3.5–5.0)
Alkaline Phosphatase: 86 U/L (ref 38–126)
Anion gap: 7 (ref 5–15)
BUN: 28 mg/dL — ABNORMAL HIGH (ref 8–23)
CO2: 22 mmol/L (ref 22–32)
Calcium: 8.7 mg/dL — ABNORMAL LOW (ref 8.9–10.3)
Chloride: 112 mmol/L — ABNORMAL HIGH (ref 98–111)
Creatinine, Ser: 2.31 mg/dL — ABNORMAL HIGH (ref 0.61–1.24)
GFR, Estimated: 29 mL/min — ABNORMAL LOW (ref >60)
Glucose, Bld: 96 mg/dL (ref 70–99)
Potassium: 4.1 mmol/L (ref 3.5–5.1)
Sodium: 141 mmol/L (ref 135–145)
Total Bilirubin: 0.8 mg/dL (ref 0.3–1.2)
Total Protein: 7.7 g/dL (ref 6.5–8.1)

## 2022-01-23 LAB — PROTIME-INR
INR: 1 (ref 0.8–1.2)
Prothrombin Time: 13.3 seconds (ref 11.4–15.2)

## 2022-01-23 LAB — CBC WITH DIFFERENTIAL/PLATELET
Abs Immature Granulocytes: 0.02 10*3/uL (ref 0.00–0.07)
Basophils Absolute: 0 10*3/uL (ref 0.0–0.1)
Basophils Relative: 0 %
Eosinophils Absolute: 0.1 10*3/uL (ref 0.0–0.5)
Eosinophils Relative: 2 %
HCT: 38.9 % — ABNORMAL LOW (ref 39.0–52.0)
Hemoglobin: 11.9 g/dL — ABNORMAL LOW (ref 13.0–17.0)
Immature Granulocytes: 0 %
Lymphocytes Relative: 20 %
Lymphs Abs: 1.1 10*3/uL (ref 0.7–4.0)
MCH: 23.5 pg — ABNORMAL LOW (ref 26.0–34.0)
MCHC: 30.6 g/dL (ref 30.0–36.0)
MCV: 76.7 fL — ABNORMAL LOW (ref 80.0–100.0)
Monocytes Absolute: 0.6 10*3/uL (ref 0.1–1.0)
Monocytes Relative: 11 %
Neutro Abs: 3.8 10*3/uL (ref 1.7–7.7)
Neutrophils Relative %: 67 %
Platelets: 154 10*3/uL (ref 150–400)
RBC: 5.07 MIL/uL (ref 4.22–5.81)
RDW: 16.7 % — ABNORMAL HIGH (ref 11.5–15.5)
WBC: 5.7 10*3/uL (ref 4.0–10.5)
nRBC: 0 % (ref 0.0–0.2)

## 2022-01-23 LAB — GLUCOSE, CAPILLARY: Glucose-Capillary: 96 mg/dL (ref 70–99)

## 2022-01-23 MED ORDER — LIDOCAINE HCL 1 % IJ SOLN
INTRAMUSCULAR | Status: AC
  Filled 2022-01-23: qty 20

## 2022-01-23 MED ORDER — FENTANYL CITRATE (PF) 100 MCG/2ML IJ SOLN
INTRAMUSCULAR | Status: AC | PRN
  Administered 2022-01-23 (×2): 50 ug via INTRAVENOUS

## 2022-01-23 MED ORDER — TECHNETIUM TO 99M ALBUMIN AGGREGATED
4.0000 | Freq: Once | INTRAVENOUS | Status: AC | PRN
  Administered 2022-01-23: 4 via INTRAVENOUS

## 2022-01-23 MED ORDER — SODIUM CHLORIDE 0.9 % IV BOLUS
250.0000 mL | Freq: Once | INTRAVENOUS | Status: AC
  Administered 2022-01-23: 250 mL via INTRAVENOUS

## 2022-01-23 MED ORDER — MIDAZOLAM HCL 2 MG/2ML IJ SOLN
INTRAMUSCULAR | Status: AC | PRN
  Administered 2022-01-23 (×2): 1 mg via INTRAVENOUS

## 2022-01-23 MED ORDER — FENTANYL CITRATE (PF) 100 MCG/2ML IJ SOLN
INTRAMUSCULAR | Status: AC
  Filled 2022-01-23: qty 2

## 2022-01-23 MED ORDER — LIDOCAINE HCL (PF) 1 % IJ SOLN
INTRAMUSCULAR | Status: AC | PRN
  Administered 2022-01-23: 5 mL

## 2022-01-23 MED ORDER — IODIXANOL 320 MG/ML IV SOLN
50.0000 mL | Freq: Once | INTRAVENOUS | Status: AC | PRN
  Administered 2022-01-23: 10 mL via INTRA_ARTERIAL

## 2022-01-23 MED ORDER — IODIXANOL 320 MG/ML IV SOLN: 50.0000 mL | Freq: Once | INTRAVENOUS | Status: DC | PRN

## 2022-01-23 MED ORDER — SODIUM CHLORIDE 0.9 % IV SOLN: INTRAVENOUS | Status: DC

## 2022-01-23 MED ORDER — IODIXANOL 320 MG/ML IV SOLN
50.0000 mL | Freq: Once | INTRAVENOUS | Status: AC | PRN
Start: 2022-01-23 — End: 2022-01-23
  Administered 2022-01-23: 10 mL via INTRA_ARTERIAL

## 2022-01-23 MED ORDER — MIDAZOLAM HCL 2 MG/2ML IJ SOLN
INTRAMUSCULAR | Status: AC
  Filled 2022-01-23: qty 4

## 2022-01-23 MED ORDER — IODIXANOL 320 MG/ML IV SOLN
50.0000 mL | Freq: Once | INTRAVENOUS | Status: DC | PRN
Start: 2022-01-23 — End: 2022-01-23

## 2022-01-23 NOTE — H&P (Addendum)
Referring Physician(s): Kale,G  Supervising Physician: Markus Daft  Patient Status:  [facility]  Chief Complaint: Multifocal hepatocellular carcinoma   Subjective: Aaron [name] is a 76 y.o. male with multifocal hepatocellular carcinoma that has been treated for multiple years.  He has had multiple liver directed therapies including Y90 radioembolization, chemoembolization and microwave ablation. He has undergone Y90 radioembolization to the right hepatic lobe on 06/18/2020, Y90 radioembolization into the central left hepatic lobe on 03/01/2020 and had Y90 radioembolization to the lateral left hepatic artery segments on 11/19/2020.  Most recently, the patient had a bland embolization to left hepatic artery branches on 06/06/2021.  AFP level has slightly increased to 86 from 54.2.  Patient had an MRI of the liver on 10/04/2021 that demonstrates a mixed response to treatment with at least 1 worsening lesion in segment 4A. Most recent bland embolization to the left hepatic lobe did not seem to have a great effect on on his liver disease . He appears to be a candidate for additional Y90 radioembolization to the lateral left hepatic segments, segments 2 and 3 and would probably be a candidate for additional treatment to the right hepatic lobe if needed.  The central left hepatic lobe or segments 4 have already received a large dose and would not offer additional Y90 treatment in this area.  He presents today for pre Y-90 hepatic arterial roadmapping study.  He currently denies fever, headache, chest pain, dyspnea, cough, abdominal pain, nausea, vomiting or bleeding.  He does have some lower back pain.  Past Medical History:  Diagnosis Date   Arthritis    Diabetes mellitus without complication ([facility])    type II    Dyspnea    Elevated liver enzymes    Fatty liver    Hepatitis C    genotype 1A status post treatment with Linzie Collin and Ribavarin for 24 weeks   Hepatocellular carcinoma ([facility]) 01/30/2014   Path    History of colon polyps    Hyperlipidemia    Hypertension    Iron deficiency anemia    hx of    MGUS (monoclonal gammopathy of unknown significance)    Past Surgical History:  Procedure Laterality Date   COLONOSCOPY     ESOPHAGOGASTRODUODENOSCOPY  2016   IR ANGIOGRAM SELECTIVE EACH ADDITIONAL VESSEL  04/04/2019   IR ANGIOGRAM SELECTIVE EACH ADDITIONAL VESSEL  04/04/2019   IR ANGIOGRAM SELECTIVE EACH ADDITIONAL VESSEL  04/04/2019   IR ANGIOGRAM SELECTIVE EACH ADDITIONAL VESSEL  04/04/2019   IR ANGIOGRAM SELECTIVE EACH ADDITIONAL VESSEL  04/04/2019   IR ANGIOGRAM SELECTIVE EACH ADDITIONAL VESSEL  04/04/2019   IR ANGIOGRAM SELECTIVE EACH ADDITIONAL VESSEL  04/04/2019   IR ANGIOGRAM SELECTIVE EACH ADDITIONAL VESSEL  05/16/2019   IR ANGIOGRAM SELECTIVE EACH ADDITIONAL VESSEL  05/16/2019   IR ANGIOGRAM SELECTIVE EACH ADDITIONAL VESSEL  05/16/2019   IR ANGIOGRAM SELECTIVE EACH ADDITIONAL VESSEL  02/14/2020   IR ANGIOGRAM SELECTIVE EACH ADDITIONAL VESSEL  02/14/2020   IR ANGIOGRAM SELECTIVE EACH ADDITIONAL VESSEL  02/14/2020   IR ANGIOGRAM SELECTIVE EACH ADDITIONAL VESSEL  02/14/2020   IR ANGIOGRAM SELECTIVE EACH ADDITIONAL VESSEL  03/01/2020   IR ANGIOGRAM SELECTIVE EACH ADDITIONAL VESSEL  03/01/2020   IR ANGIOGRAM SELECTIVE EACH ADDITIONAL VESSEL  03/01/2020   IR ANGIOGRAM SELECTIVE EACH ADDITIONAL VESSEL  03/01/2020   IR ANGIOGRAM SELECTIVE EACH ADDITIONAL VESSEL  03/01/2020   IR ANGIOGRAM SELECTIVE EACH ADDITIONAL VESSEL  06/18/2020   IR ANGIOGRAM SELECTIVE EACH ADDITIONAL VESSEL  06/18/2020   IR ANGIOGRAM  SELECTIVE EACH ADDITIONAL VESSEL  11/19/2020   IR ANGIOGRAM SELECTIVE EACH ADDITIONAL VESSEL  11/19/2020   IR ANGIOGRAM SELECTIVE EACH ADDITIONAL VESSEL  06/06/2021   IR ANGIOGRAM SELECTIVE EACH ADDITIONAL VESSEL  06/06/2021   IR ANGIOGRAM SELECTIVE EACH ADDITIONAL VESSEL  06/06/2021   IR ANGIOGRAM SELECTIVE EACH ADDITIONAL VESSEL  06/06/2021   IR ANGIOGRAM VISCERAL SELECTIVE  04/04/2019   IR ANGIOGRAM  VISCERAL SELECTIVE  05/16/2019   IR ANGIOGRAM VISCERAL SELECTIVE  02/14/2020   IR ANGIOGRAM VISCERAL SELECTIVE  02/14/2020   IR ANGIOGRAM VISCERAL SELECTIVE  03/01/2020   IR ANGIOGRAM VISCERAL SELECTIVE  06/18/2020   IR ANGIOGRAM VISCERAL SELECTIVE  11/19/2020   IR EMBO ARTERIAL NOT HEMORR HEMANG INC GUIDE ROADMAPPING  02/14/2020   IR EMBO TUMOR ORGAN ISCHEMIA INFARCT INC GUIDE ROADMAPPING  04/04/2019   IR EMBO TUMOR ORGAN ISCHEMIA INFARCT INC GUIDE ROADMAPPING  05/16/2019   IR EMBO TUMOR ORGAN ISCHEMIA INFARCT INC GUIDE ROADMAPPING  03/01/2020   IR EMBO TUMOR ORGAN ISCHEMIA INFARCT INC GUIDE ROADMAPPING  06/18/2020   IR EMBO TUMOR ORGAN ISCHEMIA INFARCT INC GUIDE ROADMAPPING  11/19/2020   IR EMBO TUMOR ORGAN ISCHEMIA INFARCT INC GUIDE ROADMAPPING  06/06/2021   IR GENERIC HISTORICAL  02/26/2016   IR RADIOLOGIST EVAL & MGMT 02/26/2016 Markus Daft, MD [facility] INTERV RAD   IR GENERIC HISTORICAL  01/16/2016   IR RADIOLOGIST EVAL & MGMT 01/16/2016 Markus Daft, MD [facility] INTERV RAD   IR GENERIC HISTORICAL  06/24/2016   IR RADIOLOGIST EVAL & MGMT 06/24/2016 Aletta Edouard, MD [facility] INTERV RAD   IR RADIOLOGIST EVAL & MGMT  12/17/2016   IR RADIOLOGIST EVAL & MGMT  06/16/2017   IR RADIOLOGIST EVAL & MGMT  09/08/2017   IR RADIOLOGIST EVAL & MGMT  10/26/2017   IR RADIOLOGIST EVAL & MGMT  05/12/2018   IR RADIOLOGIST EVAL & MGMT  07/26/2018   IR RADIOLOGIST EVAL & MGMT  03/22/2019   IR RADIOLOGIST EVAL & MGMT  04/11/2019   IR RADIOLOGIST EVAL & MGMT  06/28/2019   IR RADIOLOGIST EVAL & MGMT  10/10/2019   IR RADIOLOGIST EVAL & MGMT  05/21/2020   IR RADIOLOGIST EVAL & MGMT  06/12/2020   IR RADIOLOGIST EVAL & MGMT  10/24/2020   IR RADIOLOGIST EVAL & MGMT  12/11/2020   IR RADIOLOGIST EVAL & MGMT  02/27/2021   IR RADIOLOGIST EVAL & MGMT  11/25/2021   IR US GUIDE VASC ACCESS LEFT  05/16/2019   IR US GUIDE VASC ACCESS RIGHT  04/04/2019   IR US GUIDE VASC ACCESS RIGHT  02/14/2020   IR US GUIDE VASC ACCESS RIGHT  03/01/2020   IR US GUIDE VASC  ACCESS RIGHT  06/18/2020   IR US GUIDE VASC ACCESS RIGHT  11/19/2020   IR US GUIDE VASC ACCESS RIGHT  06/06/2021   POLYPECTOMY     RADIOFREQUENCY ABLATION N/A 07/21/2017   Procedure: CT MICROWAVE THERMAL ABLATION-LIVER;  Surgeon: Markus Daft, MD;  Location: [facility];  Service: Anesthesiology;  Laterality: N/A;   RADIOFREQUENCY ABLATION N/A 06/29/2018   Procedure: CT MICROWAVE THERMAL ABLATION;  Surgeon: Markus Daft, MD;  Location: [facility];  Service: Anesthesiology;  Laterality: N/A;     Allergies: Patient has no known allergies.  Medications: Prior to Admission medications   Medication Sig Start Date End Date Taking? Authorizing Provider  amLODipine (NORVASC) 10 MG tablet Take 1 tablet (10 mg total) by mouth daily. 06/27/21  Yes Brunetta Genera, MD  aspirin EC 81 MG tablet Take 81  mg by mouth every morning.    Yes [provider]  cholecalciferol (VITAMIN D) 1000 UNITS tablet Take 1,000 Units by mouth every morning.    Yes [provider]  glipiZIDE (GLUCOTROL XL) 2.5 MG 24 hr tablet Take 2.5 mg by mouth daily with breakfast.   Yes [provider]  Insulin Glargine (LANTUS SOLOSTAR) 100 UNIT/ML Solostar Pen Inject 32 Units into the skin daily.   Yes [provider]  Metoprolol Succinate 50 MG CS24 1 capsule   Yes [provider]  Multiple Vitamin (MULTIVITAMIN WITH MINERALS) TABS tablet Take 1 tablet daily by mouth.   Yes [provider]  ACCU-CHEK AVIVA PLUS test strip  11/19/19   [provider]  Accu-Chek Softclix Lancets lancets  09/12/19   [provider]  lenvatinib 12 mg daily dose (LENVIMA, 12 MG DAILY DOSE,) 3 x 4 MG capsule TAKE 3 CAPSULES ('12MG'$ ) BY MOUTH DAILY. 11/17/21 11/17/22  Brunetta Genera, MD  oxyCODONE (OXY IR/ROXICODONE) 5 MG immediate release tablet Take 1 tablet (5 mg total) by mouth every 6 (six) hours as needed for severe pain. Patient not taking: Reported on 02/26/2021 03/01/20   Docia Barrier, PA  PAZEO 0.7 % SOLN Place 1 drop into both eyes every morning.  Patient not taking: Reported on 02/26/2021 05/09/18   [provider]  simvastatin (ZOCOR) 20 MG tablet Take 20 mg by mouth daily.    [provider]     Vital Signs: BP 137/68   Pulse 65   Temp 98.2 F (36.8 C) (Oral)   Resp 16   Ht 6' (1.829 m)   Wt 218 lb (98.9 kg)   SpO2 100%   BMI 29.57 kg/m   Physical Exam awake, alert.  Chest clear to auscultation bilaterally.  Heart with regular rate and rhythm.  Abdomen soft, positive bowel sounds, nontender.  Extremities with full range of motion.  Imaging: No results found.  Labs:  CBC: Recent Labs    06/27/21 0842 09/08/21 0733 11/04/21 1326 12/02/21 1417  WBC 7.4 5.4 5.0 4.7  HGB 11.9* 11.7* 12.7* 12.1*  HCT 38.8* 38.6* 41.9 39.6  PLT 282 221 178 159    COAGS: Recent Labs    06/06/21 0734  INR 1.0    BMP: Recent Labs    06/27/21 0842 09/08/21 0733 11/04/21 1326 12/02/21 1417  NA 140 138 138 138  K 4.3 4.2 4.5 4.1  CL 103 106 110 106  CO2 24 23 21* 26  GLUCOSE 162* 104* 107* 88  BUN 31* 25* 33* 24*  CALCIUM 9.6 9.0 9.4 8.8*  CREATININE 2.22* 2.17* 2.84* 2.07*  GFRNONAA 30* 31* 22* 33*    LIVER FUNCTION TESTS: Recent Labs    06/27/21 0842 09/08/21 0733 11/04/21 1326 12/02/21 1417  BILITOT 0.6 0.5 0.5 0.6  AST 35 33 42* 37  ALT '27 26 28 24  '$ ALKPHOS 122 83 84 82  PROT 8.5* 8.3* 7.8 7.8  ALBUMIN 3.6 3.8 4.2 4.0    Assessment and Plan: 76 y.o. male with multifocal hepatocellular carcinoma that has been treated for multiple years.  He has had multiple liver directed therapies including Y90 radioembolization, chemoembolization and microwave ablation. He has undergone Y90 radioembolization to the right hepatic lobe on 06/18/2020, Y90 radioembolization into the central left hepatic lobe on 03/01/2020 and had Y90 radioembolization to the lateral left hepatic artery segments on 11/19/2020.  Most recently, the patient  had a bland embolization to left hepatic artery  branches on 06/06/2021.  AFP level has slightly increased to 86 from 54.2.  Patient had an MRI of the liver on 10/04/2021 that demonstrates a mixed response to treatment with at least 1 worsening lesion in segment 4A. Most recent bland embolization to the left hepatic lobe did not seem to have a great effect on on his liver disease . He appears to be a candidate for additional Y90 radioembolization to the lateral left hepatic segments, segments 2 and 3 and would probably be a candidate for additional treatment to the right hepatic lobe if needed.  The central left hepatic lobe or segments 4 have already received a large dose and would not offer additional Y90 treatment in this area.  He presents today for pre Y-90 hepatic arterial roadmapping study.Risks and benefits of procedure were discussed with the patient including, but not limited to bleeding, infection, vascular injury or contrast induced renal failure.  This interventional procedure involves the use of X-rays and because of the nature of the planned procedure, it is possible that we will have prolonged use of X-ray fluoroscopy.  Potential radiation risks to you include (but are not limited to) the following: - A slightly elevated risk for cancer  several years later in life. This risk is typically less than 0.5% percent. This risk is low in comparison to the normal incidence of human cancer, which is 33% for women and 50% for men according to the [place]. - Radiation induced injury can include skin redness, resembling a rash, tissue breakdown / ulcers and hair loss (which can be temporary or permanent).   The likelihood of either of these occurring depends on the difficulty of the procedure and whether you are sensitive to radiation due to previous procedures, disease, or genetic conditions.   IF your procedure requires a prolonged use of radiation, you will be notified and given  written instructions for further action.  It is your responsibility to monitor the irradiated area for the 2 weeks following the procedure and to notify your physician if you are concerned that you have suffered a radiation induced injury.    All of the patient's questions were answered, patient is agreeable to proceed.  Consent signed and in chart.  LABS PENDING; pt has known renal insufficiency with creat 2.3 today ; will plan to hydrate before and after angio    Electronically Signed: D. Rowe Robert, PA-C 01/23/2022, 8:11 AM   I spent a total of 20 minutes at the the patient's bedside AND on the patient's hospital floor or unit, greater than 50% of which was counseling/coordinating care for hepatic/visceral arteriogram with possible embolization, test Y-90 dosing

## 2022-01-23 NOTE — Procedures (Signed)
Interventional Radiology Procedure:   Indications: Multifocal HCC  Procedure: Visceral angiography with injection of Tc57mMAA  Findings: Hypertrophied accessory left hepatic artery supplying large portion of lateral left hepatic lobe.  Unable to cannulate the left gastric artery and the accessory left hepatic artery.  [facility]one beam CT in proper hepatic artery and left hepatic artery branch (2/3).  Used approximately 20 ml of contrast.   Complications: No immediate complications noted.     EBL: Minimal  Plan: Nuclear medicine scan.  Will review [facility] beam CT images to plan future treatment.     Shaun Runyon R. HAnselm Pancoast MD  Pager: [phone]

## 2022-01-23 NOTE — Progress Notes (Signed)
Pt instructed per Aaron Boyer PA/IR to return next Wed or Thurs to lab per cancer center to have BMP drawn. Stated appt info in my chart. Pts wife also aware.

## 2022-01-23 NOTE — Sedation Documentation (Signed)
Transfering to nuc med

## 2022-01-27 DIAGNOSIS — N1832 Chronic kidney disease, stage 3b: Secondary | ICD-10-CM | POA: Diagnosis not present

## 2022-01-27 DIAGNOSIS — D472 Monoclonal gammopathy: Secondary | ICD-10-CM | POA: Diagnosis not present

## 2022-01-27 DIAGNOSIS — R809 Proteinuria, unspecified: Secondary | ICD-10-CM | POA: Diagnosis not present

## 2022-01-27 DIAGNOSIS — C220 Liver cell carcinoma: Secondary | ICD-10-CM | POA: Diagnosis not present

## 2022-01-27 DIAGNOSIS — I129 Hypertensive chronic kidney disease with stage 1 through stage 4 chronic kidney disease, or unspecified chronic kidney disease: Secondary | ICD-10-CM | POA: Diagnosis not present

## 2022-01-27 DIAGNOSIS — E1122 Type 2 diabetes mellitus with diabetic chronic kidney disease: Secondary | ICD-10-CM | POA: Diagnosis not present

## 2022-01-27 DIAGNOSIS — E1129 Type 2 diabetes mellitus with other diabetic kidney complication: Secondary | ICD-10-CM | POA: Diagnosis not present

## 2022-01-28 ENCOUNTER — Other Ambulatory Visit: Payer: Self-pay | Admitting: Oncology

## 2022-01-29 ENCOUNTER — Inpatient Hospital Stay: Payer: Medicare HMO | Attending: Hematology

## 2022-01-29 ENCOUNTER — Other Ambulatory Visit: Payer: Self-pay

## 2022-01-29 DIAGNOSIS — D472 Monoclonal gammopathy: Secondary | ICD-10-CM | POA: Diagnosis not present

## 2022-01-29 DIAGNOSIS — C220 Liver cell carcinoma: Secondary | ICD-10-CM | POA: Diagnosis not present

## 2022-01-29 LAB — CBC WITH DIFFERENTIAL (CANCER CENTER ONLY)
Abs Immature Granulocytes: 0.02 10*3/uL (ref 0.00–0.07)
Basophils Absolute: 0 10*3/uL (ref 0.0–0.1)
Basophils Relative: 1 %
Eosinophils Absolute: 0.1 10*3/uL (ref 0.0–0.5)
Eosinophils Relative: 2 %
HCT: 36.3 % — ABNORMAL LOW (ref 39.0–52.0)
Hemoglobin: 11.4 g/dL — ABNORMAL LOW (ref 13.0–17.0)
Immature Granulocytes: 0 %
Lymphocytes Relative: 17 %
Lymphs Abs: 0.9 10*3/uL (ref 0.7–4.0)
MCH: 23.4 pg — ABNORMAL LOW (ref 26.0–34.0)
MCHC: 31.4 g/dL (ref 30.0–36.0)
MCV: 74.4 fL — ABNORMAL LOW (ref 80.0–100.0)
Monocytes Absolute: 0.5 10*3/uL (ref 0.1–1.0)
Monocytes Relative: 10 %
Neutro Abs: 3.6 10*3/uL (ref 1.7–7.7)
Neutrophils Relative %: 70 %
Platelet Count: 169 10*3/uL (ref 150–400)
RBC: 4.88 MIL/uL (ref 4.22–5.81)
RDW: 15.9 % — ABNORMAL HIGH (ref 11.5–15.5)
WBC Count: 5.1 10*3/uL (ref 4.0–10.5)
nRBC: 0 % (ref 0.0–0.2)

## 2022-01-29 LAB — CMP (CANCER CENTER ONLY)
ALT: 31 U/L (ref 0–44)
AST: 40 U/L (ref 15–41)
Albumin: 3.9 g/dL (ref 3.5–5.0)
Alkaline Phosphatase: 116 U/L (ref 38–126)
Anion gap: 5 (ref 5–15)
BUN: 28 mg/dL — ABNORMAL HIGH (ref 8–23)
CO2: 28 mmol/L (ref 22–32)
Calcium: 9.6 mg/dL (ref 8.9–10.3)
Chloride: 106 mmol/L (ref 98–111)
Creatinine: 2.07 mg/dL — ABNORMAL HIGH (ref 0.61–1.24)
GFR, Estimated: 33 mL/min — ABNORMAL LOW (ref >60)
Glucose, Bld: 193 mg/dL — ABNORMAL HIGH (ref 70–99)
Potassium: 4.5 mmol/L (ref 3.5–5.1)
Sodium: 139 mmol/L (ref 135–145)
Total Bilirubin: 0.4 mg/dL (ref 0.3–1.2)
Total Protein: 7.6 g/dL (ref 6.5–8.1)

## 2022-02-04 ENCOUNTER — Other Ambulatory Visit: Payer: Self-pay | Admitting: Internal Medicine

## 2022-02-04 ENCOUNTER — Other Ambulatory Visit (HOSPITAL_COMMUNITY): Payer: Self-pay | Admitting: Physician Assistant

## 2022-02-04 DIAGNOSIS — C220 Liver cell carcinoma: Secondary | ICD-10-CM

## 2022-02-04 MED ORDER — DEXAMETHASONE SODIUM PHOSPHATE 10 MG/ML IJ SOLN: 8.0000 mg | Freq: Once | INTRAMUSCULAR | Status: AC

## 2022-02-04 MED ORDER — SODIUM CHLORIDE 0.9 % IV SOLN: 8.0000 mg | Freq: Once | INTRAVENOUS | Status: AC

## 2022-02-04 MED ORDER — PANTOPRAZOLE SODIUM 40 MG IV SOLR: 40.0000 mg | Freq: Once | INTRAVENOUS | Status: AC

## 2022-02-05 ENCOUNTER — Ambulatory Visit (HOSPITAL_COMMUNITY)
Admission: RE | Admit: 2022-02-05 | Discharge: 2022-02-05 | Disposition: A | Discharge status: Home / Self Care | Payer: Medicare HMO | Source: Ambulatory Visit | Attending: Diagnostic Radiology | Admitting: Diagnostic Radiology

## 2022-02-05 ENCOUNTER — Other Ambulatory Visit (HOSPITAL_COMMUNITY): Payer: Medicare HMO

## 2022-02-05 ENCOUNTER — Ambulatory Visit (HOSPITAL_COMMUNITY): Payer: Medicare HMO

## 2022-02-05 ENCOUNTER — Encounter (HOSPITAL_COMMUNITY): Payer: Self-pay

## 2022-02-05 ENCOUNTER — Other Ambulatory Visit: Payer: Self-pay | Admitting: Radiology

## 2022-02-05 ENCOUNTER — Other Ambulatory Visit (HOSPITAL_COMMUNITY): Payer: Self-pay | Admitting: Diagnostic Radiology

## 2022-02-05 DIAGNOSIS — C220 Liver cell carcinoma: Secondary | ICD-10-CM

## 2022-02-05 DIAGNOSIS — I10 Essential (primary) hypertension: Secondary | ICD-10-CM | POA: Diagnosis not present

## 2022-02-05 DIAGNOSIS — E785 Hyperlipidemia, unspecified: Secondary | ICD-10-CM | POA: Diagnosis not present

## 2022-02-05 DIAGNOSIS — E119 Type 2 diabetes mellitus without complications: Secondary | ICD-10-CM | POA: Diagnosis not present

## 2022-02-05 DIAGNOSIS — D649 Anemia, unspecified: Secondary | ICD-10-CM | POA: Diagnosis not present

## 2022-02-05 DIAGNOSIS — N2889 Other specified disorders of kidney and ureter: Secondary | ICD-10-CM | POA: Insufficient documentation

## 2022-02-05 HISTORY — PX: IR ANGIOGRAM VISCERAL SELECTIVE: IMG657

## 2022-02-05 HISTORY — PX: IR US GUIDE VASC ACCESS LEFT: IMG2389

## 2022-02-05 HISTORY — PX: IR EMBO TUMOR ORGAN ISCHEMIA INFARCT INC GUIDE ROADMAPPING: IMG5449

## 2022-02-05 HISTORY — PX: IR ANGIOGRAM SELECTIVE EACH ADDITIONAL VESSEL: IMG667

## 2022-02-05 LAB — CBC WITH DIFFERENTIAL/PLATELET
Abs Immature Granulocytes: 0.05 10*3/uL (ref 0.00–0.07)
Basophils Absolute: 0 10*3/uL (ref 0.0–0.1)
Basophils Relative: 1 %
Eosinophils Absolute: 0.1 10*3/uL (ref 0.0–0.5)
Eosinophils Relative: 3 %
HCT: 36.9 % — ABNORMAL LOW (ref 39.0–52.0)
Hemoglobin: 11.2 g/dL — ABNORMAL LOW (ref 13.0–17.0)
Immature Granulocytes: 1 %
Lymphocytes Relative: 21 %
Lymphs Abs: 1.2 10*3/uL (ref 0.7–4.0)
MCH: 23.1 pg — ABNORMAL LOW (ref 26.0–34.0)
MCHC: 30.4 g/dL (ref 30.0–36.0)
MCV: 76.2 fL — ABNORMAL LOW (ref 80.0–100.0)
Monocytes Absolute: 0.5 10*3/uL (ref 0.1–1.0)
Monocytes Relative: 10 %
Neutro Abs: 3.7 10*3/uL (ref 1.7–7.7)
Neutrophils Relative %: 64 %
Platelets: 217 10*3/uL (ref 150–400)
RBC: 4.84 MIL/uL (ref 4.22–5.81)
RDW: 16 % — ABNORMAL HIGH (ref 11.5–15.5)
WBC: 5.6 10*3/uL (ref 4.0–10.5)
nRBC: 0 % (ref 0.0–0.2)

## 2022-02-05 LAB — COMPREHENSIVE METABOLIC PANEL
ALT: 45 U/L — ABNORMAL HIGH (ref 0–44)
AST: 52 U/L — ABNORMAL HIGH (ref 15–41)
Albumin: 3.9 g/dL (ref 3.5–5.0)
Alkaline Phosphatase: 124 U/L (ref 38–126)
Anion gap: 9 (ref 5–15)
BUN: 37 mg/dL — ABNORMAL HIGH (ref 8–23)
CO2: 23 mmol/L (ref 22–32)
Calcium: 9.1 mg/dL (ref 8.9–10.3)
Chloride: 110 mmol/L (ref 98–111)
Creatinine, Ser: 2.16 mg/dL — ABNORMAL HIGH (ref 0.61–1.24)
GFR, Estimated: 31 mL/min — ABNORMAL LOW (ref >60)
Glucose, Bld: 123 mg/dL — ABNORMAL HIGH (ref 70–99)
Potassium: 4.2 mmol/L (ref 3.5–5.1)
Sodium: 142 mmol/L (ref 135–145)
Total Bilirubin: 0.8 mg/dL (ref 0.3–1.2)
Total Protein: 8.1 g/dL (ref 6.5–8.1)

## 2022-02-05 LAB — PROTIME-INR
INR: 1 (ref 0.8–1.2)
Prothrombin Time: 13.2 seconds (ref 11.4–15.2)

## 2022-02-05 LAB — GLUCOSE, CAPILLARY: Glucose-Capillary: 115 mg/dL — ABNORMAL HIGH (ref 70–99)

## 2022-02-05 MED ORDER — IODIXANOL 320 MG/ML IV SOLN
50.0000 mL | Freq: Once | INTRAVENOUS | Status: AC | PRN
  Administered 2022-02-05: 10 mL via INTRA_ARTERIAL

## 2022-02-05 MED ORDER — SODIUM CHLORIDE 0.9 % IV SOLN
8.0000 mg | Freq: Once | INTRAVENOUS | Status: AC
  Administered 2022-02-05: 8 mg via INTRAVENOUS
  Filled 2022-02-05: qty 4

## 2022-02-05 MED ORDER — NITROGLYCERIN 1 MG/10 ML FOR IR/CATH LAB: INTRA_ARTERIAL | Status: AC | PRN

## 2022-02-05 MED ORDER — IODIXANOL 320 MG/ML IV SOLN
50.0000 mL | Freq: Once | INTRAVENOUS | Status: AC | PRN
  Administered 2022-02-05: 15 mL via INTRA_ARTERIAL

## 2022-02-05 MED ORDER — PANTOPRAZOLE SODIUM 40 MG IV SOLR
40.0000 mg | Freq: Once | INTRAVENOUS | Status: AC
  Administered 2022-02-05: 40 mg via INTRAVENOUS
  Filled 2022-02-05: qty 10

## 2022-02-05 MED ORDER — DEXAMETHASONE SODIUM PHOSPHATE 10 MG/ML IJ SOLN
8.0000 mg | Freq: Once | INTRAMUSCULAR | Status: AC
  Administered 2022-02-05: 8 mg via INTRAVENOUS
  Filled 2022-02-05: qty 1

## 2022-02-05 MED ORDER — HEPARIN SODIUM (PORCINE) 1000 UNIT/ML IJ SOLN
INTRAMUSCULAR | Status: AC | PRN
  Administered 2022-02-05: 1000 [IU] via INTRAVENOUS

## 2022-02-05 MED ORDER — HEPARIN SODIUM (PORCINE) 1000 UNIT/ML IJ SOLN
INTRAMUSCULAR | Status: AC
  Filled 2022-02-05: qty 10

## 2022-02-05 MED ORDER — SODIUM CHLORIDE 0.9 % IV SOLN: INTRAVENOUS | Status: DC

## 2022-02-05 MED ORDER — NITROGLYCERIN IN D5W 100-5 MCG/ML-% IV SOLN
INTRAVENOUS | Status: AC
  Filled 2022-02-05: qty 250

## 2022-02-05 MED ORDER — LIDOCAINE HCL 1 % IJ SOLN
INTRAMUSCULAR | Status: AC
  Filled 2022-02-05: qty 20

## 2022-02-05 MED ORDER — FENTANYL CITRATE (PF) 100 MCG/2ML IJ SOLN
INTRAMUSCULAR | Status: AC
  Filled 2022-02-05: qty 2

## 2022-02-05 MED ORDER — VERAPAMIL HCL 2.5 MG/ML IV SOLN
INTRAVENOUS | Status: AC
  Filled 2022-02-05: qty 2

## 2022-02-05 MED ORDER — SODIUM CHLORIDE 0.9 % IV SOLN
2.0000 g | INTRAVENOUS | Status: AC
  Administered 2022-02-05: 2 g via INTRAVENOUS
  Filled 2022-02-05: qty 2

## 2022-02-05 MED ORDER — VERAPAMIL HCL 2.5 MG/ML IV SOLN: INTRA_ARTERIAL | Status: AC | PRN

## 2022-02-05 MED ORDER — LIDOCAINE HCL (PF) 1 % IJ SOLN
INTRAMUSCULAR | Status: AC | PRN
  Administered 2022-02-05: 5 mL

## 2022-02-05 MED ORDER — MIDAZOLAM HCL 2 MG/2ML IJ SOLN
INTRAMUSCULAR | Status: AC
  Filled 2022-02-05: qty 4

## 2022-02-05 MED ORDER — FENTANYL CITRATE (PF) 100 MCG/2ML IJ SOLN
INTRAMUSCULAR | Status: AC | PRN
  Administered 2022-02-05 (×2): 50 ug via INTRAVENOUS

## 2022-02-05 MED ORDER — MIDAZOLAM HCL 2 MG/2ML IJ SOLN
INTRAMUSCULAR | Status: AC | PRN
  Administered 2022-02-05 (×2): 1 mg via INTRAVENOUS

## 2022-02-05 NOTE — Procedures (Signed)
Interventional Radiology Procedure:   Indications: Multifocal HCC  Procedure: Bland embolization of accessory left hepatic artery  Findings: Hypervascular left hepatic lesions supplied by accessory left hepatic artery.  Successful pruning of the left hepatic artery branches after embolization with 100-300 micron Embospheres.  No significant tumor vascularity after embolization.  Left radial access removed with TR band.  Complications: No immediate complications noted.     EBL: Minimal  Plan: TR band removal.  Plan for discharge after 3 hours   Oris Calmes R. Anselm Pancoast, MD  Pager: [phone]

## 2022-02-05 NOTE — H&P (Signed)
Referring Physician(s): Kale,G  Supervising Physician: Markus Daft  Patient Status:  [facility]  Chief Complaint: Multifocal hepatocellular carcinoma   Subjective: Aaron [name] is a 76 y.o. male with multifocal hepatocellular carcinoma that has been treated for multiple years.  He has had multiple liver directed therapies including Y90 radioembolization, chemoembolization and microwave ablation. He has undergone Y90 radioembolization to the right hepatic lobe on 06/18/2020, Y90 radioembolization into the central left hepatic lobe on 03/01/2020 and had Y90 radioembolization to the lateral left hepatic artery segments on 11/19/2020.  He had a bland embolization to left hepatic artery branches on 06/06/2021.  AFP level has slightly increased to 86 from 54.2.  Patient had an MRI of the liver on 10/04/2021 that demonstrates a mixed response to treatment with at least 1 worsening lesion in segment 4A. Most recent bland embolization to the left hepatic lobe did not seem to have a great effect on on his liver disease .  He underwent additional visceral/arterial roadmapping in anticipation of Y-90 hepatic radioembolization on 01/23/2022.  This study revealed: 1. Largest hepatic lesions are being supplied by an accessory left hepatic artery originating from the left gastric artery. This artery has hypertrophied over time based on prior imaging. 2. Minimal tumor vascularity from the main left hepatic arteries. 3. Small right hepatic artery branches are supplying small tumors in the central aspect of the liver. 4. Based on today's imaging findings, plan for bland embolization to the accessory left hepatic artery coming off the left gastric artery  He presents again today for above-noted bland embolization to the accessory left hepatic artery coming off the left gastric artery.  He currently denies fever, headache, chest pain, dyspnea, cough, abdominal pain, nausea, vomiting or bleeding.  He does have some  chronic back pain.  Past medical history as listed below.  Past Medical History:  Diagnosis Date   Arthritis    Diabetes mellitus without complication ([place])    type II    Dyspnea    Elevated liver enzymes    Fatty liver    Hepatitis C    genotype 1A status post treatment with Linzie Collin and Ribavarin for 24 weeks   Hepatocellular carcinoma ([place]) 01/30/2014   Path   History of colon polyps    Hyperlipidemia    Hypertension    Iron deficiency anemia    hx of    MGUS (monoclonal gammopathy of unknown significance)    Past Surgical History:  Procedure Laterality Date   COLONOSCOPY     ESOPHAGOGASTRODUODENOSCOPY  2016   IR 3D INDEPENDENT WKST  01/23/2022   IR ANGIOGRAM SELECTIVE EACH ADDITIONAL VESSEL  04/04/2019   IR ANGIOGRAM SELECTIVE EACH ADDITIONAL VESSEL  04/04/2019   IR ANGIOGRAM SELECTIVE EACH ADDITIONAL VESSEL  04/04/2019   IR ANGIOGRAM SELECTIVE EACH ADDITIONAL VESSEL  04/04/2019   IR ANGIOGRAM SELECTIVE EACH ADDITIONAL VESSEL  04/04/2019   IR ANGIOGRAM SELECTIVE EACH ADDITIONAL VESSEL  04/04/2019   IR ANGIOGRAM SELECTIVE EACH ADDITIONAL VESSEL  04/04/2019   IR ANGIOGRAM SELECTIVE EACH ADDITIONAL VESSEL  05/16/2019   IR ANGIOGRAM SELECTIVE EACH ADDITIONAL VESSEL  05/16/2019   IR ANGIOGRAM SELECTIVE EACH ADDITIONAL VESSEL  05/16/2019   IR ANGIOGRAM SELECTIVE EACH ADDITIONAL VESSEL  02/14/2020   IR ANGIOGRAM SELECTIVE EACH ADDITIONAL VESSEL  02/14/2020   IR ANGIOGRAM SELECTIVE EACH ADDITIONAL VESSEL  02/14/2020   IR ANGIOGRAM SELECTIVE EACH ADDITIONAL VESSEL  02/14/2020   IR ANGIOGRAM SELECTIVE EACH ADDITIONAL VESSEL  03/01/2020   IR ANGIOGRAM SELECTIVE EACH ADDITIONAL  VESSEL  03/01/2020   IR ANGIOGRAM SELECTIVE EACH ADDITIONAL VESSEL  03/01/2020   IR ANGIOGRAM SELECTIVE EACH ADDITIONAL VESSEL  03/01/2020   IR ANGIOGRAM SELECTIVE EACH ADDITIONAL VESSEL  03/01/2020   IR ANGIOGRAM SELECTIVE EACH ADDITIONAL VESSEL  06/18/2020   IR ANGIOGRAM SELECTIVE EACH ADDITIONAL VESSEL  06/18/2020   IR  ANGIOGRAM SELECTIVE EACH ADDITIONAL VESSEL  11/19/2020   IR ANGIOGRAM SELECTIVE EACH ADDITIONAL VESSEL  11/19/2020   IR ANGIOGRAM SELECTIVE EACH ADDITIONAL VESSEL  06/06/2021   IR ANGIOGRAM SELECTIVE EACH ADDITIONAL VESSEL  06/06/2021   IR ANGIOGRAM SELECTIVE EACH ADDITIONAL VESSEL  06/06/2021   IR ANGIOGRAM SELECTIVE EACH ADDITIONAL VESSEL  06/06/2021   IR ANGIOGRAM SELECTIVE EACH ADDITIONAL VESSEL  01/23/2022   IR ANGIOGRAM SELECTIVE EACH ADDITIONAL VESSEL  01/23/2022   IR ANGIOGRAM VISCERAL SELECTIVE  04/04/2019   IR ANGIOGRAM VISCERAL SELECTIVE  05/16/2019   IR ANGIOGRAM VISCERAL SELECTIVE  02/14/2020   IR ANGIOGRAM VISCERAL SELECTIVE  02/14/2020   IR ANGIOGRAM VISCERAL SELECTIVE  03/01/2020   IR ANGIOGRAM VISCERAL SELECTIVE  06/18/2020   IR ANGIOGRAM VISCERAL SELECTIVE  11/19/2020   IR ANGIOGRAM VISCERAL SELECTIVE  01/23/2022   IR EMBO ARTERIAL NOT HEMORR HEMANG INC GUIDE ROADMAPPING  02/14/2020   IR EMBO TUMOR ORGAN ISCHEMIA INFARCT INC GUIDE ROADMAPPING  04/04/2019   IR EMBO TUMOR ORGAN ISCHEMIA INFARCT INC GUIDE ROADMAPPING  05/16/2019   IR EMBO TUMOR ORGAN ISCHEMIA INFARCT INC GUIDE ROADMAPPING  03/01/2020   IR EMBO TUMOR ORGAN ISCHEMIA INFARCT INC GUIDE ROADMAPPING  06/18/2020   IR EMBO TUMOR ORGAN ISCHEMIA INFARCT INC GUIDE ROADMAPPING  11/19/2020   IR EMBO TUMOR ORGAN ISCHEMIA INFARCT INC GUIDE ROADMAPPING  06/06/2021   IR GENERIC HISTORICAL  02/26/2016   IR RADIOLOGIST EVAL & MGMT 02/26/2016 Markus Daft, MD [facility]   IR GENERIC HISTORICAL  01/16/2016   IR RADIOLOGIST EVAL & MGMT 01/16/2016 Markus Daft, MD [facility]   IR GENERIC HISTORICAL  06/24/2016   IR RADIOLOGIST EVAL & MGMT 06/24/2016 Aletta Edouard, MD [facility]   IR RADIOLOGIST EVAL & MGMT  12/17/2016   IR RADIOLOGIST EVAL & MGMT  06/16/2017   IR RADIOLOGIST EVAL & MGMT  09/08/2017   IR RADIOLOGIST EVAL & MGMT  10/26/2017   IR RADIOLOGIST EVAL & MGMT  05/12/2018   IR RADIOLOGIST EVAL & MGMT  07/26/2018   IR RADIOLOGIST EVAL  & MGMT  03/22/2019   IR RADIOLOGIST EVAL & MGMT  04/11/2019   IR RADIOLOGIST EVAL & MGMT  06/28/2019   IR RADIOLOGIST EVAL & MGMT  10/10/2019   IR RADIOLOGIST EVAL & MGMT  05/21/2020   IR RADIOLOGIST EVAL & MGMT  06/12/2020   IR RADIOLOGIST EVAL & MGMT  10/24/2020   IR RADIOLOGIST EVAL & MGMT  12/11/2020   IR RADIOLOGIST EVAL & MGMT  02/27/2021   IR RADIOLOGIST EVAL & MGMT  11/25/2021   IR US GUIDE VASC ACCESS LEFT  05/16/2019   IR US GUIDE VASC ACCESS RIGHT  04/04/2019   IR US GUIDE VASC ACCESS RIGHT  02/14/2020   IR US GUIDE VASC ACCESS RIGHT  03/01/2020   IR US GUIDE VASC ACCESS RIGHT  06/18/2020   IR US GUIDE VASC ACCESS RIGHT  11/19/2020   IR US GUIDE VASC ACCESS RIGHT  06/06/2021   IR US GUIDE [place] RIGHT  01/23/2022   POLYPECTOMY     RADIOFREQUENCY ABLATION N/A 07/21/2017   Procedure: CT MICROWAVE THERMAL ABLATION-LIVER;  Surgeon: Markus Daft, MD;  Location: [facility];  Service: Anesthesiology;  Laterality: N/A;   RADIOFREQUENCY ABLATION N/A 06/29/2018   Procedure: CT MICROWAVE THERMAL ABLATION;  Surgeon: Markus Daft, MD;  Location: [facility];  Service: Anesthesiology;  Laterality: N/A;      Allergies: Patient has no known allergies.  Medications: Prior to Admission medications   Medication Sig Start Date End Date Taking? Authorizing Provider  ACCU-CHEK AVIVA PLUS test strip  11/19/19   [provider]  Accu-Chek Softclix Lancets lancets  09/12/19   [provider]  amLODipine (NORVASC) 10 MG tablet Take 1 tablet (10 mg total) by mouth daily. 06/27/21   Brunetta Genera, MD  aspirin EC 81 MG tablet Take 81 mg by mouth every morning.     [provider]  cholecalciferol (VITAMIN D) 1000 UNITS tablet Take 1,000 Units by mouth every morning.     [provider]  glipiZIDE (GLUCOTROL XL) 2.5 MG 24 hr tablet Take 2.5 mg by mouth daily with breakfast.    [provider]  Insulin Glargine (LANTUS SOLOSTAR) 100 UNIT/ML Solostar Pen Inject 32 Units into the  skin daily.    [provider]  lenvatinib 12 mg daily dose (LENVIMA, 12 MG DAILY DOSE,) 3 x 4 MG capsule TAKE 3 CAPSULES ('12MG'$ ) BY MOUTH DAILY. 11/17/21 11/17/22  Brunetta Genera, MD  Metoprolol Succinate 50 MG CS24 1 capsule    [provider]  Multiple Vitamin (MULTIVITAMIN WITH MINERALS) TABS tablet Take 1 tablet daily by mouth.    [provider]  PAZEO 0.7 % SOLN Place 1 drop into both eyes every morning.  Patient not taking: Reported on 02/26/2021 05/09/18   [provider]  simvastatin (ZOCOR) 20 MG tablet Take 20 mg by mouth daily.    [provider]     Vital Signs: Vitals:   02/05/22 0812  BP: (!) 155/80  Pulse: 73  Resp: 16  Temp: 97.9 F (36.6 C)  SpO2: 100%       Physical Exam awake, alert.  Chest clear to auscultation bilaterally.  Heart with regular rate and rhythm.  Abdomen soft, positive bowel sounds, nontender.  No significant lower extremity edema.  Imaging: No results found.  Labs:  CBC: Recent Labs    11/04/21 1326 12/02/21 1417 01/23/22 0808 01/29/22 1349  WBC 5.0 4.7 5.7 5.1  HGB 12.7* 12.1* 11.9* 11.4*  HCT 41.9 39.6 38.9* 36.3*  PLT 178 159 154 169    COAGS: Recent Labs    06/06/21 0734 01/23/22 0808  INR 1.0 1.0    BMP: Recent Labs    11/04/21 1326 12/02/21 1417 01/23/22 0808 01/29/22 1349  NA 138 138 141 139  K 4.5 4.1 4.1 4.5  CL 110 106 112* 106  CO2 21* '26 22 28  '$ GLUCOSE 107* 88 96 193*  BUN 33* 24* 28* 28*  CALCIUM 9.4 8.8* 8.7* 9.6  CREATININE 2.84* 2.07* 2.31* 2.07*  GFRNONAA 22* 33* 29* 33*    LIVER FUNCTION TESTS: Recent Labs    11/04/21 1326 12/02/21 1417 01/23/22 0808 01/29/22 1349  BILITOT 0.5 0.6 0.8 0.4  AST 42* 37 42* 40  ALT '28 24 28 31  '$ ALKPHOS 84 82 86 116  PROT 7.8 7.8 7.7 7.6  ALBUMIN 4.2 4.0 3.9 3.9    Assessment and Plan: Bannon Giammarco is a 76 y.o. male with past medical history significant for arthritis, diabetes, fatty liver, hepatitis  C, hyperlipidemia, hypertension, anemia, MGUS and multifocal hepatocellular carcinoma that has been treated  for multiple years.  He has had multiple liver directed therapies including Y90 radioembolization, chemoembolization and microwave ablation. He has undergone Y90 radioembolization to the right hepatic lobe on 06/18/2020, Y90 radioembolization into the central left hepatic lobe on 03/01/2020 and had Y90 radioembolization to the lateral left hepatic artery segments on 11/19/2020.  He had a bland embolization to left hepatic artery branches on 06/06/2021.  AFP level has slightly increased to 86 from 54.2.  Patient had an MRI of the liver on 10/04/2021 that demonstrates a mixed response to treatment with at least 1 worsening lesion in segment 4A. Most recent bland embolization to the left hepatic lobe did not seem to have a great effect on on his liver disease .  He underwent additional visceral/arterial roadmapping in anticipation of Y-90 hepatic radioembolization on 01/23/2022.  This study revealed: 1. Largest hepatic lesions are being supplied by an accessory left hepatic artery originating from the left gastric artery. This artery has hypertrophied over time based on prior imaging. 2. Minimal tumor vascularity from the main left hepatic arteries. 3. Small right hepatic artery branches are supplying small tumors in the central aspect of the liver. 4. Based on today's imaging findings, plan for bland embolization to the accessory left hepatic artery coming off the left gastric artery  He presents again today for above-noted bland embolization to the accessory left hepatic artery coming off the left gastric artery.Risks and benefits of procedure were discussed with the patient including, but not limited to bleeding, infection, vascular injury or contrast induced renal failure.  This interventional procedure involves the use of X-rays and because of the nature of the planned procedure, it is possible that  we will have prolonged use of X-ray fluoroscopy.  Potential radiation risks to you include (but are not limited to) the following: - A slightly elevated risk for cancer  several years later in life. This risk is typically less than 0.5% percent. This risk is low in comparison to the normal incidence of human cancer, which is 33% for women and 50% for men according to the [employer]. - Radiation induced injury can include skin redness, resembling a rash, tissue breakdown / ulcers and hair loss (which can be temporary or permanent).   The likelihood of either of these occurring depends on the difficulty of the procedure and whether you are sensitive to radiation due to previous procedures, disease, or genetic conditions.   IF your procedure requires a prolonged use of radiation, you will be notified and given written instructions for further action.  It is your responsibility to monitor the irradiated area for the 2 weeks following the procedure and to notify your physician if you are concerned that you have suffered a radiation induced injury.    All of the patient's questions were answered, patient is agreeable to proceed.  Consent signed and in chart.  LABS PENDING    Electronically Signed: D. Rowe Robert, PA-C 02/05/2022, 7:54 AM   I spent a total of  20 minutes at the the patient's bedside AND on the patient's hospital floor or unit, greater than 50% of which was counseling/coordinating care for hepatic arteriogram with bland embolization to treat hepatocellular carcinoma

## 2022-02-05 NOTE — Progress Notes (Signed)
Patient ID: Aaron [name], male   DOB: 11/20/1945, 76 y.o.   MRN: [id number] Prescription for Augmentin 875/125, one tablet bid for 3 days called into pt's pharmacy per order of Dr. Anselm Pancoast; pt s/p bland hepatic embolization today.

## 2022-02-06 ENCOUNTER — Other Ambulatory Visit: Payer: Self-pay | Admitting: *Deleted

## 2022-02-06 DIAGNOSIS — C220 Liver cell carcinoma: Secondary | ICD-10-CM

## 2022-02-09 ENCOUNTER — Inpatient Hospital Stay: Payer: Medicare HMO

## 2022-02-09 ENCOUNTER — Other Ambulatory Visit: Payer: Self-pay

## 2022-02-09 ENCOUNTER — Other Ambulatory Visit: Payer: Self-pay | Admitting: Lab

## 2022-02-09 ENCOUNTER — Inpatient Hospital Stay (HOSPITAL_BASED_OUTPATIENT_CLINIC_OR_DEPARTMENT_OTHER): Payer: Medicare HMO | Admitting: Hematology

## 2022-02-09 VITALS — BP 142/73 | HR 73 | Temp 97.7°F | Resp 20 | Wt 224.0 lb

## 2022-02-09 DIAGNOSIS — C220 Liver cell carcinoma: Secondary | ICD-10-CM | POA: Diagnosis not present

## 2022-02-09 DIAGNOSIS — D472 Monoclonal gammopathy: Secondary | ICD-10-CM | POA: Diagnosis not present

## 2022-02-09 LAB — CBC WITH DIFFERENTIAL (CANCER CENTER ONLY)
Abs Immature Granulocytes: 0.1 10*3/uL — ABNORMAL HIGH (ref 0.00–0.07)
Basophils Absolute: 0 10*3/uL (ref 0.0–0.1)
Basophils Relative: 0 %
Eosinophils Absolute: 0.1 10*3/uL (ref 0.0–0.5)
Eosinophils Relative: 1 %
HCT: 34.8 % — ABNORMAL LOW (ref 39.0–52.0)
Hemoglobin: 11 g/dL — ABNORMAL LOW (ref 13.0–17.0)
Immature Granulocytes: 1 %
Lymphocytes Relative: 11 %
Lymphs Abs: 0.9 10*3/uL (ref 0.7–4.0)
MCH: 23.3 pg — ABNORMAL LOW (ref 26.0–34.0)
MCHC: 31.6 g/dL (ref 30.0–36.0)
MCV: 73.7 fL — ABNORMAL LOW (ref 80.0–100.0)
Monocytes Absolute: 1.1 10*3/uL — ABNORMAL HIGH (ref 0.1–1.0)
Monocytes Relative: 13 %
Neutro Abs: 6.2 10*3/uL (ref 1.7–7.7)
Neutrophils Relative %: 74 %
Platelet Count: 208 10*3/uL (ref 150–400)
RBC: 4.72 MIL/uL (ref 4.22–5.81)
RDW: 15.6 % — ABNORMAL HIGH (ref 11.5–15.5)
WBC Count: 8.4 10*3/uL (ref 4.0–10.5)
nRBC: 0 % (ref 0.0–0.2)

## 2022-02-09 LAB — CMP (CANCER CENTER ONLY)
ALT: 64 U/L — ABNORMAL HIGH (ref 0–44)
AST: 77 U/L — ABNORMAL HIGH (ref 15–41)
Albumin: 3.6 g/dL (ref 3.5–5.0)
Alkaline Phosphatase: 152 U/L — ABNORMAL HIGH (ref 38–126)
Anion gap: 8 (ref 5–15)
BUN: 37 mg/dL — ABNORMAL HIGH (ref 8–23)
CO2: 25 mmol/L (ref 22–32)
Calcium: 9.4 mg/dL (ref 8.9–10.3)
Chloride: 102 mmol/L (ref 98–111)
Creatinine: 2.13 mg/dL — ABNORMAL HIGH (ref 0.61–1.24)
GFR, Estimated: 32 mL/min — ABNORMAL LOW (ref >60)
Glucose, Bld: 195 mg/dL — ABNORMAL HIGH (ref 70–99)
Potassium: 4 mmol/L (ref 3.5–5.1)
Sodium: 135 mmol/L (ref 135–145)
Total Bilirubin: 0.8 mg/dL (ref 0.3–1.2)
Total Protein: 7.7 g/dL (ref 6.5–8.1)

## 2022-02-09 LAB — MAGNESIUM: Magnesium: 1.8 mg/dL (ref 1.7–2.4)

## 2022-02-09 NOTE — Progress Notes (Incomplete)
HEMATOLOGY/ONCOLOGY CLINIC NOTE  Date of Service: 02/09/2022   Patient Care Team: [name], DO as PCP - General (Family Medicine)  CHIEF COMPLAINTS/PURPOSE OF CONSULTATION:  F/u continued evaluation and management for [place] on Lenvatinib  HISTORY OF PRESENTING ILLNESS:  Please see previous note for details on initial presentation  INTERVAL HISTORY: Aaron [name] is a 76 y.o. male here for follow-up for [facility]. He reports He is doing well with no new symptoms or concerns.  Patient had bland embolization on 02/05/2022 for his hepatocellular carcinoma.  No fever, chills, night sweats. No new lumps, bumps, or lesions/rashes. No abdominal pain or change in bowel habits. No new or unexpected weight loss. No SOB or chest pain. No other new or acute focal symptoms.  Labs done today were reviewed in detail.  MEDICAL HISTORY:  Past Medical History:  Diagnosis Date   Arthritis    Diabetes mellitus without complication ([place])    type II    Dyspnea    Elevated liver enzymes    Fatty liver    Hepatitis C    genotype 1A status post treatment with Linzie Collin and Ribavarin for 24 weeks   Hepatocellular carcinoma ([place]) 01/30/2014   Path   History of colon polyps    Hyperlipidemia    Hypertension    Iron deficiency anemia    hx of    MGUS (monoclonal gammopathy of unknown significance)    Obesity Hepatocellular carcinoma treated with TACE and percutaneous thermal ablation by interventional radiology. Last MRI on 08/06/2015 showed slight decrease in the size of the ablation defect involving the right lobe of the liver area and no findings to suggest residual or recurrent hepatocellular carcinoma. Mild changes of liver cirrhosis. MRI Abd 06/24/2016- no evidence of recurrent HCC   Hepatitis C genotype 1A status post treatment with Viekira and Ribavarin for 24 weeks.  Monoclonal gammopathy of undetermined significance.  SURGICAL HISTORY:  Status post microwave ablation[October  2015] of liver lesion and TACE [July 2015] for [place]. EGD and colonoscopy in 2016   SOCIAL HISTORY: Social History   Socioeconomic History   Marital status: Married    Spouse name: Not on file   Number of children: Not on file   Years of education: Not on file   Highest education level: Not on file  Occupational History   Not on file  Tobacco Use   Smoking status: Former    Types: Cigarettes    Quit date: 01/30/1994    Years since quitting: 28.0   Smokeless tobacco: Never   Tobacco comments:    stopped 25 yrs ago  Vaping Use   Vaping Use: Never used  Substance and Sexual Activity   Alcohol use: No    Comment: stopped 30 years ago    Drug use: No   Sexual activity: Not Currently  Other Topics Concern   Not on file  Social History Narrative   Not on file   Social Determinants of Health   Financial Resource Strain: Not on file  Food Insecurity: Not on file  Transportation Needs: Not on file  Physical Activity: Not on file  Stress: Not on file  Social Connections: Not on file  Intimate Partner Violence: Not on file  Former smoker and smoked 1 pack per day for about 20 years starting at age 48 quit 25 years ago.  FAMILY HISTORY: No family history on file.  ALLERGIES:  has No Known Allergies.  MEDICATIONS:  Current Outpatient Medications  Medication Sig Dispense  Refill   ACCU-CHEK AVIVA PLUS test strip      Accu-Chek Softclix Lancets lancets      amLODipine (NORVASC) 10 MG tablet Take 1 tablet (10 mg total) by mouth daily. 30 tablet 5   aspirin EC 81 MG tablet Take 81 mg by mouth every morning.      cholecalciferol (VITAMIN D) 1000 UNITS tablet Take 1,000 Units by mouth every morning.      glipiZIDE (GLUCOTROL XL) 2.5 MG 24 hr tablet Take 2.5 mg by mouth daily with breakfast.     Insulin Glargine (LANTUS SOLOSTAR) 100 UNIT/ML Solostar Pen Inject 32 Units into the skin daily.     lenvatinib 12 mg daily dose (LENVIMA, 12 MG DAILY DOSE,) 3 x 4 MG capsule TAKE 3  CAPSULES (12MG) BY MOUTH DAILY. 90 each 2   Metoprolol Succinate 50 MG CS24 1 capsule     Multiple Vitamin (MULTIVITAMIN WITH MINERALS) TABS tablet Take 1 tablet daily by mouth.     PAZEO 0.7 % SOLN Place 1 drop into both eyes every morning.  (Patient not taking: Reported on 02/26/2021)  3   simvastatin (ZOCOR) 20 MG tablet Take 20 mg by mouth daily.     Current Facility-Administered Medications  Medication Dose Route Frequency Provider Last Rate Last Admin   oxyCODONE (Oxy IR/ROXICODONE) immediate release tablet 5 mg  5 mg Oral Q6H PRN Jacqualine Mau, NP       Facility-Administered Medications Ordered in Other Visits  Medication Dose Route Frequency Provider Last Rate Last Admin   dexamethasone (DECADRON) injection 8 mg  8 mg Intravenous Once Narda Rutherford T, NP       ondansetron [facility]) 8 mg in sodium chloride 0.9 % 50 mL IVPB  8 mg Intravenous Once Narda Rutherford T, NP       pantoprazole (PROTONIX) injection 40 mg  40 mg Intravenous Once Tyson Alias, NP        REVIEW OF SYSTEMS:   .10 Point review of Systems was done is negative except as noted above.  PHYSICAL EXAMINATION: There were no vitals filed for this visit. NAD GENERAL:alert, in no acute distress and comfortable SKIN: no acute rashes, no significant lesions EYES: conjunctiva are pink and non-injected, sclera anicteric NECK: supple, no JVD LYMPH:  no palpable lymphadenopathy in the cervical, axillary or inguinal regions LUNGS: clear to auscultation b/l with normal respiratory effort HEART: regular rate & rhythm ABDOMEN:  normoactive bowel sounds , non tender, not distended. Extremity: no pedal edema PSYCH: alert & oriented x 3 with fluent speech NEURO: no focal motor/sensory deficits  LABORATORY DATA:  I have reviewed the data as listed     Latest Ref Rng & Units 02/05/2022    8:11 AM 01/29/2022    1:49 PM 01/23/2022    8:08 AM  CBC  WBC 4.0 - 10.5 K/uL 5.6  5.1  5.7   Hemoglobin 13.0 - 17.0 g/dL 11.2  11.4   11.9   Hematocrit 39.0 - 52.0 % 36.9  36.3  38.9   Platelets 150 - 400 K/uL 217  169  154     CBC    Component Value Date/Time   WBC 5.6 02/05/2022 0811   RBC 4.84 02/05/2022 0811   HGB 11.2 (L) 02/05/2022 0811   HGB 11.4 (L) 01/29/2022 1349   HGB 11.5 (L) 03/22/2017 1402   HCT 36.9 (L) 02/05/2022 0811   HCT 37.0 (L) 03/22/2017 1402   PLT 217 02/05/2022 0811   PLT 169  01/29/2022 1349   PLT 262 03/22/2017 1402   MCV 76.2 (L) 02/05/2022 0811   MCV 72.3 (L) 03/22/2017 1402   MCH 23.1 (L) 02/05/2022 0811   MCHC 30.4 02/05/2022 0811   RDW 16.0 (H) 02/05/2022 0811   RDW 15.6 (H) 03/22/2017 1402   LYMPHSABS 1.2 02/05/2022 0811   LYMPHSABS 1.8 03/22/2017 1402   MONOABS 0.5 02/05/2022 0811   MONOABS 0.4 03/22/2017 1402   EOSABS 0.1 02/05/2022 0811   EOSABS 0.1 03/22/2017 1402   BASOSABS 0.0 02/05/2022 0811   BASOSABS 0.0 03/22/2017 1402    .    Latest Ref Rng & Units 02/05/2022    8:11 AM 01/29/2022    1:49 PM 01/23/2022    8:08 AM  CMP  Glucose 70 - 99 mg/dL 123  193  96   BUN 8 - 23 mg/dL 37  28  28   Creatinine 0.61 - 1.24 mg/dL 2.16  2.07  2.31   Sodium 135 - 145 mmol/L 142  139  141   Potassium 3.5 - 5.1 mmol/L 4.2  4.5  4.1   Chloride 98 - 111 mmol/L 110  106  112   CO2 22 - 32 mmol/L _0 Calcium 8.9 - 10.3 mg/dL 9.1  9.6  8.7   Total Protein 6.5 - 8.1 g/dL 8.1  7.6  7.7   Total Bilirubin 0.3 - 1.2 mg/dL 0.8  0.4  0.8   Alkaline Phos 38 - 126 U/L 124  116  86   AST 15 - 41 U/L 52  40  42   ALT 0 - 44 U/L 45  31  28       06/21/2019 MR ABDOMEN WWO CONTRAST (Accession [id number])     IFE 1  Comment    Comments: Immunofixation shows IgG monoclonal protein with kappa light chain  specificity.           RADIOGRAPHIC STUDIES:  MRI abd w and wo contrast: 12/17/2016: IMPRESSION: 1. Postprocedural changes of thermal ablation again noted in the right lobe of the liver, without definitive evidence to suggest local recurrence of disease. No new hepatic  lesions are noted. 2. Additional incidental findings, as above.     Electronically Signed   By: Vinnie Langton M.D.   On: 12/17/2016 09:42    MRI abd w and wo contrast 01/26/2020 IMPRESSION: 1. Interval enlargement of multiple hepatic masses as detailed above, consistent with progression of multifocal hepatocellular carcinoma. There is evidence of prior ablation in hepatic segments VII and VIII. 2. No evidence of metastatic disease in the abdomen.   Electronically Signed   By: Eddie Candle M.D.   On: 01/27/2020 15:51  MRI abd and wo contrast 10/04/2021 FINDINGS: Lower chest: Unremarkable   Hepatobiliary: Multiple liver lesions are present, some of these represent previously treated tumors and some represent active enhancing malignancy.   An index solid enhancing lesion in the lateral segment left hepatic lobe measures 3.4 by 2.8 cm on image 15 series 2, formerly 3.5 by 2.7 cm, essentially stable. Just below this a T2 hyperintense lesion measuring 1.5 by 1.1 cm on image 18 of series 2 previously measured 1.4 by 1.2 cm, likewise stable.   A currently enhancing lesion in the right hepatic lobe adjacent to the IVC measures 1.8 by 1.6 cm on image 13 series 2, stable.   A mostly centrally necrotic a 3.6 by 3.1 cm mostly centrally necrotic lesion in segment 4a of the liver  on image 29 of series 21 previously measured 4.3 by 3.0 cm. This lesion is mostly centrally necrotic today although with some nodular enhancement along its margin as on image 29 series 21.   Previously treated right hepatic lobe lesions are observed, and mostly nonenhancing or poorly enhancing.   A somewhat indistinctly marginated lesion posteriorly in segment 4a of the liver is diffusely enhancing and measures about 2.9 by 2.8 cm on image 28 of series 21. A previous faintly T2 hyperintense lesion in this vicinity on 02/20/2021 measured 2.0 by 1.6 cm, and accordingly this represents enlargement of this  enhancing mass.   An enhancing mass laterally in segment 3 of the liver measures 3.0 by 2.6 cm on image 31 series 8, formerly 2.9 by 2.6 cm, essentially stable.   No biliary dilatation is observed.   Pancreas:  Unremarkable   Spleen:  Unremarkable   Adrenals/Urinary Tract:  Unremarkable   Stomach/Bowel: Unremarkable   Vascular/Lymphatic: Atherosclerosis is present, including aortoiliac atherosclerotic disease.   Other:  No supplemental non-categorized findings.   Musculoskeletal: Unchanged mild lower lumbar spondylosis and degenerative disc disease.   IMPRESSION: 1. Mixed appearance, with some of the enhancing liver lesions stable; some improved such as the centrally necrotic anterior segment 4A lesion which demonstrates substantial central necrosis compared to previous; but with at least 1 worsened lesion which is the posterior segment 4a lesion (image 29, series 20), moderately increased in size. 2. Aortic Atherosclerosis (ICD10-I70.0). 3. Mild lower lumbar spondylosis and degenerative disc disease.  Electronically Signed   By: Van Clines M.D.   On: 10/06/2021 11:08  ASSESSMENT & PLAN:   76 y.o. male with  #1 Monoclonal gammopathy of undetermined significance.  M protein 0.2 mg/dL immunofixation showing IgG monoclonal protein with kappa light chain specificity. SPEP 02/2016 - 0.3g/dl  No overt anemia.  No overt hypercalcemia. Stable CKD creatinine 1.6  #2 bone lucencies in the right radial mid midshaft and left humeral head. PET/CT did not show any hypermetabolic bone lesions. MRI left shoulder showed no evidence of metastatic disease or multiple myeloma. The x-ray findings appear to be areas of mild demineralization. Patient was seen by orthopedics and no additional recommendations were given.  Plan -Patient's anemia is stable. No significant change in renal function. No new bone pains. No hypercalcemia . No overall no overt evidence of progression of  multiple myeloma. -Myeloma labs from today show stable M protein @ 0.2g/dl with no evidence of progression- consistent with MGUS.  #3 Hepatocellular carcinoma treated with TACE and percutaneous thermal ablation , Y 90 and recent bland arterial embolization on 06/06/2021 by interventional radiology.   Last MRI on 08/06/2015 showed slight decrease in the size of the ablation defect involving the right lobe of the liver area and no findings to suggest residual or recurrent hepatocellular carcinoma. Mild changes of liver cirrhosis.  01/16/2016 MRI Abdomen showed resolution of previously ablated lesion but a new 1.3 cm lesion was noted which was subsequently ablated under CT guidance by interventional radiology on 01/31/2016. Elevated transaminases on 02/01/2016 were likely related to his ablation. These have since resolved.   06/24/2016 MRI Abdomen showed no evidence of recurrent hepatocellular carcinoma . 12/17/2016 MRI Abdomen shows no evidence of recurrent hepatocellular carcinoma .  10/26/17 MRI Abdomen revealed Motion degraded images. Status post thermal ablation of the segment 8 lesion, without enhancement to suggest residual viable tumor. Prior ablation of a segment 7 lesion, grossly unchanged. No convincing central enhancement  03/14/2019  MRI abdomen w and  wo contrast revealed "1. New areas of restricted diffusion, surrounding nodular arterial phase enhancement and delayed washout surrounding the ablation zone defects anteriorly in segments 8 and 7 consistent with local recurrence of hepatocellular carcinoma. 2. The peripheral ablation zone defect laterally in segment 8 is unchanged. 3. No extrahepatic tumor identified."  10/02/2019 MRI Abdomen ([id number]) revealed "1. Progressive multifocal hepatocellular carcinoma, especially anteriorly around the ablated lesion in segments 4B and 8. There are additional new lesions in segments 2 and 3. No evidence of extrahepatic metastatic disease."  01/26/2020  MRI Abdomen ([id number]) revealed "1. Interval enlargement of multiple hepatic masses as detailed above, consistent with progression of multifocal hepatocellular carcinoma. There is evidence of prior ablation in hepatic segments VII and VIII. 2. No evidence of metastatic disease in the abdomen."  10/04/2021 MRI Abdomen ([id number]) revealed "1. Mixed appearance, with some of the enhancing liver lesions stable; some improved such as the centrally necrotic anterior segment 4A lesion which demonstrates substantial central necrosis compared to previous; but with at least 1 worsened lesion which is the posterior segment 4a lesion (image 29, series 20), moderately increased in size."  #4 Hepatitis C genotype 1A status post treatment with Viekira and Ribavarin for 24 weeks.   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    PLAN:  -Lab results today 02/09/2022 showed stable hemoglobin of 11 with a normal WBC count and platelets. CMP stable with stable creatinine of 2.13. Magnesium 1.8. -continue on Lenvima 12 mg p.o. daily at this time.  -Recommended to continue to increase water intake to 48-64 oz daily. -Patient had bland embolization on 02/05/2022 for his hepatocellular carcinoma. -if broader progression not addressable with locally directed treatments then will need to consider EBRT and change in systemic treatments. -continue f/u with pcp for continued optimization of HTN and DM2 and CKD. -RTC in 2 months with labs.  FOLLOW UP: RTC with Dr Irene Limbo with labs in 2 months  The total time spent in the appointment was *** minutes*.  All of the patient's questions were answered with apparent satisfaction. The patient knows to call the clinic with any problems, questions or concerns.   [name] MD MS AAHIVMS [facility] Hematology/Oncology Physician [facility]  .*Total Encounter Time as defined by the Centers for  Medicare and Medicaid Services includes, in addition to the face-to-face time of a patient visit (documented in the note above) non-face-to-face time: obtaining and reviewing outside history, ordering and reviewing medications, tests or procedures, care coordination (communications with other health care professionals or caregivers) and documentation in the medical record.   I, Melene Muller, am acting as scribe for Dr. [name], MD.

## 2022-02-10 LAB — AFP TUMOR MARKER: AFP, Serum, Tumor Marker: 381 ng/mL — ABNORMAL HIGH (ref 0.0–8.4)

## 2022-02-11 ENCOUNTER — Telehealth: Payer: Self-pay | Admitting: Hematology

## 2022-02-11 DIAGNOSIS — C220 Liver cell carcinoma: Secondary | ICD-10-CM | POA: Diagnosis not present

## 2022-02-11 DIAGNOSIS — D472 Monoclonal gammopathy: Secondary | ICD-10-CM | POA: Diagnosis not present

## 2022-02-11 DIAGNOSIS — E7800 Pure hypercholesterolemia, unspecified: Secondary | ICD-10-CM | POA: Diagnosis not present

## 2022-02-11 DIAGNOSIS — E1149 Type 2 diabetes mellitus with other diabetic neurological complication: Secondary | ICD-10-CM | POA: Diagnosis not present

## 2022-02-11 NOTE — Telephone Encounter (Signed)
Left message with follow-up appointment per 6/19 los.

## 2022-02-16 ENCOUNTER — Other Ambulatory Visit (HOSPITAL_COMMUNITY): Payer: Self-pay

## 2022-02-18 DIAGNOSIS — C220 Liver cell carcinoma: Secondary | ICD-10-CM | POA: Diagnosis not present

## 2022-02-18 DIAGNOSIS — N3000 Acute cystitis without hematuria: Secondary | ICD-10-CM | POA: Diagnosis not present

## 2022-02-18 DIAGNOSIS — N184 Chronic kidney disease, stage 4 (severe): Secondary | ICD-10-CM | POA: Diagnosis not present

## 2022-02-18 DIAGNOSIS — D472 Monoclonal gammopathy: Secondary | ICD-10-CM | POA: Diagnosis not present

## 2022-02-18 DIAGNOSIS — E1142 Type 2 diabetes mellitus with diabetic polyneuropathy: Secondary | ICD-10-CM | POA: Diagnosis not present

## 2022-02-18 DIAGNOSIS — E1149 Type 2 diabetes mellitus with other diabetic neurological complication: Secondary | ICD-10-CM | POA: Diagnosis not present

## 2022-02-18 DIAGNOSIS — E7800 Pure hypercholesterolemia, unspecified: Secondary | ICD-10-CM | POA: Diagnosis not present

## 2022-02-18 DIAGNOSIS — I10 Essential (primary) hypertension: Secondary | ICD-10-CM | POA: Diagnosis not present

## 2022-03-12 ENCOUNTER — Other Ambulatory Visit (HOSPITAL_COMMUNITY): Payer: Self-pay

## 2022-03-12 ENCOUNTER — Other Ambulatory Visit: Payer: Self-pay | Admitting: Hematology

## 2022-03-12 MED ORDER — LENVIMA (12 MG DAILY DOSE) 3 X 4 MG PO CPPK
ORAL_CAPSULE | ORAL | 2 refills | Status: DC
  Filled 2022-03-12: qty 90, 30d supply, fill #0
  Filled 2022-04-10: qty 90, 30d supply, fill #1
  Filled 2022-05-07: qty 90, 30d supply, fill #2

## 2022-03-19 ENCOUNTER — Other Ambulatory Visit (HOSPITAL_COMMUNITY): Payer: Self-pay

## 2022-03-20 ENCOUNTER — Encounter: Payer: Self-pay | Admitting: *Deleted

## 2022-03-20 ENCOUNTER — Ambulatory Visit
Admission: RE | Admit: 2022-03-20 | Discharge: 2022-03-20 | Disposition: A | Discharge status: Home / Self Care | Payer: Medicare HMO | Source: Ambulatory Visit | Attending: Radiology | Admitting: Radiology

## 2022-03-20 DIAGNOSIS — C220 Liver cell carcinoma: Secondary | ICD-10-CM

## 2022-03-20 HISTORY — PX: IR RADIOLOGIST EVAL & MGMT: IMG5224

## 2022-03-20 NOTE — Progress Notes (Signed)
Chief Complaint: Patient was consulted remotely today (Aaron [name]) for follow-up hepatocellular carcinoma  Referring Physician(s): [name]  History of Present Illness: Aaron [name] is a 76 y.o. male with history of multifocal hepatocellular carcinoma.  Patient has had multiple liver directed therapies including ablations, Y90 radioembolization and bland embolizations.  Most recently, the patient had bland embolization to the left hepatic lobe through accessory left hepatic artery on 02/05/2022.  Patient is also on the kinase inhibitor, Lenvima.  I talked to the patient on the phone today.  Patient appears to be doing very well.  He has no specific complaints.  He denies shortness of breath, chest pain, abdominal distention, abdominal pain, bowel or urinary issues.  He has a good appetite and says that he has probably gained 4 pounds recently.  He is still trying to work in his shop and is still mowing the lawn.  His outside activities are limited right now due to the elevated outside temperatures.  Patient tolerated the recent bland embolization without complications.  Patient was seen by Dr. Irene Limbo 4 days after the procedure and there was no significant change in his liver function after the embolization and his renal function was stable after the procedure.  Patient's AFP level on 11/04/2021 was 86 and it was 381 on 02/09/2022.  Past Medical History:  Diagnosis Date   Arthritis    Diabetes mellitus without complication (HCC)    type II    Dyspnea    Elevated liver enzymes    Fatty liver    Hepatitis C    genotype 1A status post treatment with Linzie Collin and Ribavarin for 24 weeks   Hepatocellular carcinoma (HCC) 01/30/2014   Path   History of colon polyps    Hyperlipidemia    Hypertension    Iron deficiency anemia    hx of    MGUS (monoclonal gammopathy of unknown significance)     Past Surgical History:  Procedure Laterality Date   COLONOSCOPY     ESOPHAGOGASTRODUODENOSCOPY   2016   IR 3D INDEPENDENT WKST  01/23/2022   IR ANGIOGRAM SELECTIVE EACH ADDITIONAL VESSEL  04/04/2019   IR ANGIOGRAM SELECTIVE EACH ADDITIONAL VESSEL  04/04/2019   IR ANGIOGRAM SELECTIVE EACH ADDITIONAL VESSEL  04/04/2019   IR ANGIOGRAM SELECTIVE EACH ADDITIONAL VESSEL  04/04/2019   IR ANGIOGRAM SELECTIVE EACH ADDITIONAL VESSEL  04/04/2019   IR ANGIOGRAM SELECTIVE EACH ADDITIONAL VESSEL  04/04/2019   IR ANGIOGRAM SELECTIVE EACH ADDITIONAL VESSEL  04/04/2019   IR ANGIOGRAM SELECTIVE EACH ADDITIONAL VESSEL  05/16/2019   IR ANGIOGRAM SELECTIVE EACH ADDITIONAL VESSEL  05/16/2019   IR ANGIOGRAM SELECTIVE EACH ADDITIONAL VESSEL  05/16/2019   IR ANGIOGRAM SELECTIVE EACH ADDITIONAL VESSEL  02/14/2020   IR ANGIOGRAM SELECTIVE EACH ADDITIONAL VESSEL  02/14/2020   IR ANGIOGRAM SELECTIVE EACH ADDITIONAL VESSEL  02/14/2020   IR ANGIOGRAM SELECTIVE EACH ADDITIONAL VESSEL  02/14/2020   IR ANGIOGRAM SELECTIVE EACH ADDITIONAL VESSEL  03/01/2020   IR ANGIOGRAM SELECTIVE EACH ADDITIONAL VESSEL  03/01/2020   IR ANGIOGRAM SELECTIVE EACH ADDITIONAL VESSEL  03/01/2020   IR ANGIOGRAM SELECTIVE EACH ADDITIONAL VESSEL  03/01/2020   IR ANGIOGRAM SELECTIVE EACH ADDITIONAL VESSEL  03/01/2020   IR ANGIOGRAM SELECTIVE EACH ADDITIONAL VESSEL  06/18/2020   IR ANGIOGRAM SELECTIVE EACH ADDITIONAL VESSEL  06/18/2020   IR ANGIOGRAM SELECTIVE EACH ADDITIONAL VESSEL  11/19/2020   IR ANGIOGRAM SELECTIVE EACH ADDITIONAL VESSEL  11/19/2020   IR ANGIOGRAM SELECTIVE EACH ADDITIONAL VESSEL  06/06/2021   IR ANGIOGRAM SELECTIVE  EACH ADDITIONAL VESSEL  06/06/2021   IR ANGIOGRAM SELECTIVE EACH ADDITIONAL VESSEL  06/06/2021   IR ANGIOGRAM SELECTIVE EACH ADDITIONAL VESSEL  06/06/2021   IR ANGIOGRAM SELECTIVE EACH ADDITIONAL VESSEL  01/23/2022   IR ANGIOGRAM SELECTIVE EACH ADDITIONAL VESSEL  01/23/2022   IR ANGIOGRAM SELECTIVE EACH ADDITIONAL VESSEL  02/05/2022   IR ANGIOGRAM SELECTIVE EACH ADDITIONAL VESSEL  02/05/2022   IR ANGIOGRAM VISCERAL SELECTIVE  04/04/2019    IR ANGIOGRAM VISCERAL SELECTIVE  05/16/2019   IR ANGIOGRAM VISCERAL SELECTIVE  02/14/2020   IR ANGIOGRAM VISCERAL SELECTIVE  02/14/2020   IR ANGIOGRAM VISCERAL SELECTIVE  03/01/2020   IR ANGIOGRAM VISCERAL SELECTIVE  06/18/2020   IR ANGIOGRAM VISCERAL SELECTIVE  11/19/2020   IR ANGIOGRAM VISCERAL SELECTIVE  01/23/2022   IR ANGIOGRAM VISCERAL SELECTIVE  02/05/2022   IR EMBO ARTERIAL NOT HEMORR HEMANG INC GUIDE ROADMAPPING  02/14/2020   IR EMBO TUMOR ORGAN ISCHEMIA INFARCT INC GUIDE ROADMAPPING  04/04/2019   IR EMBO TUMOR ORGAN ISCHEMIA INFARCT INC GUIDE ROADMAPPING  05/16/2019   IR EMBO TUMOR ORGAN ISCHEMIA INFARCT INC GUIDE ROADMAPPING  03/01/2020   IR EMBO TUMOR ORGAN ISCHEMIA INFARCT INC GUIDE ROADMAPPING  06/18/2020   IR EMBO TUMOR ORGAN ISCHEMIA INFARCT INC GUIDE ROADMAPPING  11/19/2020   IR EMBO TUMOR ORGAN ISCHEMIA INFARCT INC GUIDE ROADMAPPING  06/06/2021   IR EMBO TUMOR ORGAN ISCHEMIA INFARCT INC GUIDE ROADMAPPING  02/05/2022   IR GENERIC HISTORICAL  02/26/2016   IR RADIOLOGIST EVAL & MGMT 02/26/2016 Markus Daft, MD [facility]   IR GENERIC HISTORICAL  01/16/2016   IR RADIOLOGIST EVAL & MGMT 01/16/2016 Markus Daft, MD [facility]   IR GENERIC HISTORICAL  06/24/2016   IR RADIOLOGIST EVAL & MGMT 06/24/2016 Aletta Edouard, MD [facility]   IR RADIOLOGIST EVAL & MGMT  12/17/2016   IR RADIOLOGIST EVAL & MGMT  06/16/2017   IR RADIOLOGIST EVAL & MGMT  09/08/2017   IR RADIOLOGIST EVAL & MGMT  10/26/2017   IR RADIOLOGIST EVAL & MGMT  05/12/2018   IR RADIOLOGIST EVAL & MGMT  07/26/2018   IR RADIOLOGIST EVAL & MGMT  03/22/2019   IR RADIOLOGIST EVAL & MGMT  04/11/2019   IR RADIOLOGIST EVAL & MGMT  06/28/2019   IR RADIOLOGIST EVAL & MGMT  10/10/2019   IR RADIOLOGIST EVAL & MGMT  05/21/2020   IR RADIOLOGIST EVAL & MGMT  06/12/2020   IR RADIOLOGIST EVAL & MGMT  10/24/2020   IR RADIOLOGIST EVAL & MGMT  12/11/2020   IR RADIOLOGIST EVAL & MGMT  02/27/2021   IR RADIOLOGIST EVAL & MGMT  11/25/2021   IR US GUIDE VASC  ACCESS LEFT  05/16/2019   IR US GUIDE VASC ACCESS LEFT  02/05/2022   IR US GUIDE VASC ACCESS RIGHT  04/04/2019   IR US GUIDE VASC ACCESS RIGHT  02/14/2020   IR US GUIDE VASC ACCESS RIGHT  03/01/2020   IR US GUIDE VASC ACCESS RIGHT  06/18/2020   IR US GUIDE VASC ACCESS RIGHT  11/19/2020   IR US GUIDE VASC ACCESS RIGHT  06/06/2021   IR US GUIDE VASC ACCESS RIGHT  01/23/2022   POLYPECTOMY     RADIOFREQUENCY ABLATION N/A 07/21/2017   Procedure: CT MICROWAVE THERMAL ABLATION-LIVER;  Surgeon: Markus Daft, MD;  Location: [facility];  Service: Anesthesiology;  Laterality: N/A;   RADIOFREQUENCY ABLATION N/A 06/29/2018   Procedure: CT MICROWAVE THERMAL ABLATION;  Surgeon: Markus Daft, MD;  Location: [facility];  Service: Anesthesiology;  Laterality:  N/A;    Allergies: Patient has no known allergies.  Medications: Prior to Admission medications   Medication Sig Start Date End Date Taking? Authorizing Provider  ACCU-CHEK AVIVA PLUS test strip  11/19/19   [provider]  Accu-Chek Softclix Lancets lancets  09/12/19   [provider]  amLODipine (NORVASC) 10 MG tablet Take 1 tablet (10 mg total) by mouth daily. 06/27/21   Brunetta Genera, MD  aspirin EC 81 MG tablet Take 81 mg by mouth every morning.     [provider]  cholecalciferol (VITAMIN D) 1000 UNITS tablet Take 1,000 Units by mouth every morning.     [provider]  glipiZIDE (GLUCOTROL XL) 2.5 MG 24 hr tablet Take 2.5 mg by mouth daily with breakfast.    [provider]  Insulin Glargine (LANTUS SOLOSTAR) 100 UNIT/ML Solostar Pen Inject 32 Units into the skin daily.    [provider]  lenvatinib 12 mg daily dose (LENVIMA, 12 MG DAILY DOSE,) 3 x 4 MG capsule TAKE 3 CAPSULES ('12MG'$ ) BY MOUTH DAILY. 03/12/22 03/12/23  Brunetta Genera, MD  Metoprolol Succinate 50 MG CS24 1 capsule    [provider]  Multiple Vitamin (MULTIVITAMIN WITH MINERALS) TABS tablet Take 1 tablet daily by mouth.     [provider]  PAZEO 0.7 % SOLN Place 1 drop into both eyes every morning.  Patient not taking: Reported on 02/26/2021 05/09/18   [provider]  simvastatin (ZOCOR) 20 MG tablet Take 20 mg by mouth daily.    [provider]     No family history on file.  Social History   Socioeconomic History   Marital status: Married    Spouse name: Not on file   Number of children: Not on file   Years of education: Not on file   Highest education level: Not on file  Occupational History   Not on file  Tobacco Use   Smoking status: Former    Types: Cigarettes    Quit date: 01/30/1994    Years since quitting: 28.1   Smokeless tobacco: Never   Tobacco comments:    stopped 25 yrs ago  Vaping Use   Vaping Use: Never used  Substance and Sexual Activity   Alcohol use: No    Comment: stopped 30 years ago    Drug use: No   Sexual activity: Not Currently  Other Topics Concern   Not on file  Social History Narrative   Not on file   Social Determinants of Health   Financial Resource Strain: Not on file  Food Insecurity: Not on file  Transportation Needs: Not on file  Physical Activity: Not on file  Stress: Not on file  Social Connections: Not on file    ECOG Status: 1 - Symptomatic but completely ambulatory  Review of Systems  Constitutional:  Positive for fatigue. Negative for activity change and unexpected weight change.  Respiratory: Negative.    Cardiovascular: Negative.   Gastrointestinal: Negative.   Genitourinary: Negative.        Physical Exam No direct physical exam was performed  Vital Signs: There were no vitals taken for this visit.  Imaging: No results found.  Labs:  CBC: Recent Labs    01/23/22 0808 01/29/22 1349 02/05/22 0811 02/09/22 1228  WBC 5.7 5.1 5.6 8.4  HGB 11.9* 11.4* 11.2* 11.0*  HCT 38.9* 36.3* 36.9* 34.8*  PLT 154 169 217 208    COAGS: Recent Labs    06/06/21 0734  01/23/22 0808 02/05/22 0811  INR  1.0 1.0 1.0    BMP: Recent Labs    01/23/22 0808 01/29/22 1349 02/05/22 0811 02/09/22 1228  NA 141 139 142 135  K 4.1 4.5 4.2 4.0  CL 112* 106 110 102  CO2 '22 28 23 25  '$ GLUCOSE 96 193* 123* 195*  BUN 28* 28* 37* 37*  CALCIUM 8.7* 9.6 9.1 9.4  CREATININE 2.31* 2.07* 2.16* 2.13*  GFRNONAA 29* 33* 31* 32*    LIVER FUNCTION TESTS: Recent Labs    01/23/22 0808 01/29/22 1349 02/05/22 0811 02/09/22 1228  BILITOT 0.8 0.4 0.8 0.8  AST 42* 40 52* 77*  ALT 28 31 45* 64*  ALKPHOS 86 116 124 152*  PROT 7.7 7.6 8.1 7.7  ALBUMIN 3.9 3.9 3.9 3.6    TUMOR MARKERS: No results for input(s): "AFPTM", "CEA", "CA199", "CHROMGRNA" in the last 8760 hours.  Assessment and Plan:  76 year old with multifocal hepatocellular carcinoma.  Patient has undergone multiple liver directed therapies and is currently taking a kinase inhibitor managed by Dr. Irene Limbo.  Most recent liver directed therapy was treatment to the left hepatic lobe using bland embolization via the accessory left hepatic artery on 02/05/2022.  Patient tolerated this procedure well without complication.  Patient also has underlying renal insufficiency and his kidney function did not significantly change following the liver directed therapy.  Patient is scheduled to follow-up with oncology on 04/14/2022.  The last cross-sectional imaging was MRI from 10/04/2021.  We will schedule an abdominal MRI, with and without contrast, for mid August, hopefully prior to the oncology appointment.  Depending on the MRI findings, we can decide about additional liver directed therapy.  Patient is comfortable with this plan.  He will contact us if he has any questions or concerns in the interim.  Thank you for this interesting consult.  I greatly enjoyed meeting Aaron [name] and look forward to participating in their care.  A copy of this report was sent to the requesting provider on this date.  Electronically Signed: Burman Riis 03/20/2022, 10:19 AM   I  spent a total of    10 Minutes in remote  clinical consultation, greater than 50% of which was counseling/coordinating care for multifocal hepatocellular carcinoma.    Visit type: Audio only (telephone). Audio (no video) only due to technical limitations. Alternative for in-person consultation at [facility], [address], [place], [place]. This visit type was conducted due to national recommendations for restrictions regarding the COVID-19 Pandemic (e.g. social distancing).  This format is felt to be most appropriate for this patient at this time.  All issues noted in this document were discussed and addressed.   Patient ID: Aaron [name], male   DOB: 05-22-1946, 76 y.o.   MRN: [id number]

## 2022-03-23 ENCOUNTER — Other Ambulatory Visit: Payer: Self-pay | Admitting: Diagnostic Radiology

## 2022-03-23 DIAGNOSIS — C220 Liver cell carcinoma: Secondary | ICD-10-CM

## 2022-04-09 ENCOUNTER — Ambulatory Visit (HOSPITAL_COMMUNITY)
Admission: RE | Admit: 2022-04-09 | Discharge: 2022-04-09 | Disposition: A | Discharge status: Home / Self Care | Payer: Medicare HMO | Source: Ambulatory Visit | Attending: Diagnostic Radiology | Admitting: Diagnostic Radiology

## 2022-04-09 DIAGNOSIS — C220 Liver cell carcinoma: Secondary | ICD-10-CM | POA: Diagnosis not present

## 2022-04-09 DIAGNOSIS — I700 Atherosclerosis of aorta: Secondary | ICD-10-CM | POA: Diagnosis not present

## 2022-04-09 DIAGNOSIS — K769 Liver disease, unspecified: Secondary | ICD-10-CM | POA: Diagnosis not present

## 2022-04-09 MED ORDER — GADOBUTROL 1 MMOL/ML IV SOLN
10.0000 mL | Freq: Once | INTRAVENOUS | Status: AC | PRN
Start: 2022-04-09 — End: 2022-04-09
  Administered 2022-04-09: 10 mL via INTRAVENOUS

## 2022-04-10 ENCOUNTER — Other Ambulatory Visit (HOSPITAL_COMMUNITY): Payer: Self-pay

## 2022-04-13 ENCOUNTER — Other Ambulatory Visit: Payer: Self-pay | Admitting: *Deleted

## 2022-04-13 DIAGNOSIS — C220 Liver cell carcinoma: Secondary | ICD-10-CM

## 2022-04-14 ENCOUNTER — Ambulatory Visit
Admission: RE | Admit: 2022-04-14 | Discharge: 2022-04-14 | Disposition: A | Discharge status: Home / Self Care | Payer: Medicare HMO | Source: Ambulatory Visit | Attending: Diagnostic Radiology | Admitting: Diagnostic Radiology

## 2022-04-14 ENCOUNTER — Inpatient Hospital Stay: Payer: Medicare HMO

## 2022-04-14 ENCOUNTER — Other Ambulatory Visit: Payer: Self-pay

## 2022-04-14 ENCOUNTER — Inpatient Hospital Stay: Payer: Medicare HMO | Attending: Hematology | Admitting: Hematology

## 2022-04-14 VITALS — BP 140/74 | HR 68 | Temp 97.8°F | Resp 17 | Ht 72.0 in | Wt 219.4 lb

## 2022-04-14 DIAGNOSIS — D472 Monoclonal gammopathy: Secondary | ICD-10-CM | POA: Insufficient documentation

## 2022-04-14 DIAGNOSIS — C220 Liver cell carcinoma: Secondary | ICD-10-CM | POA: Insufficient documentation

## 2022-04-14 DIAGNOSIS — B1920 Unspecified viral hepatitis C without hepatic coma: Secondary | ICD-10-CM | POA: Diagnosis not present

## 2022-04-14 DIAGNOSIS — N189 Chronic kidney disease, unspecified: Secondary | ICD-10-CM | POA: Diagnosis not present

## 2022-04-14 DIAGNOSIS — Z9889 Other specified postprocedural states: Secondary | ICD-10-CM | POA: Diagnosis not present

## 2022-04-14 HISTORY — PX: IR RADIOLOGIST EVAL & MGMT: IMG5224

## 2022-04-14 LAB — CMP (CANCER CENTER ONLY)
ALT: 26 U/L (ref 0–44)
AST: 34 U/L (ref 15–41)
Albumin: 4.1 g/dL (ref 3.5–5.0)
Alkaline Phosphatase: 96 U/L (ref 38–126)
Anion gap: 5 (ref 5–15)
BUN: 40 mg/dL — ABNORMAL HIGH (ref 8–23)
CO2: 24 mmol/L (ref 22–32)
Calcium: 9.3 mg/dL (ref 8.9–10.3)
Chloride: 109 mmol/L (ref 98–111)
Creatinine: 2.59 mg/dL — ABNORMAL HIGH (ref 0.61–1.24)
GFR, Estimated: 25 mL/min — ABNORMAL LOW (ref >60)
Glucose, Bld: 97 mg/dL (ref 70–99)
Potassium: 4.2 mmol/L (ref 3.5–5.1)
Sodium: 138 mmol/L (ref 135–145)
Total Bilirubin: 0.5 mg/dL (ref 0.3–1.2)
Total Protein: 7.7 g/dL (ref 6.5–8.1)

## 2022-04-14 LAB — CBC WITH DIFFERENTIAL (CANCER CENTER ONLY)
Abs Immature Granulocytes: 0.02 10*3/uL (ref 0.00–0.07)
Basophils Absolute: 0 10*3/uL (ref 0.0–0.1)
Basophils Relative: 1 %
Eosinophils Absolute: 0.1 10*3/uL (ref 0.0–0.5)
Eosinophils Relative: 2 %
HCT: 38.3 % — ABNORMAL LOW (ref 39.0–52.0)
Hemoglobin: 12.1 g/dL — ABNORMAL LOW (ref 13.0–17.0)
Immature Granulocytes: 0 %
Lymphocytes Relative: 24 %
Lymphs Abs: 1.2 10*3/uL (ref 0.7–4.0)
MCH: 23.4 pg — ABNORMAL LOW (ref 26.0–34.0)
MCHC: 31.6 g/dL (ref 30.0–36.0)
MCV: 73.9 fL — ABNORMAL LOW (ref 80.0–100.0)
Monocytes Absolute: 0.5 10*3/uL (ref 0.1–1.0)
Monocytes Relative: 9 %
Neutro Abs: 3.2 10*3/uL (ref 1.7–7.7)
Neutrophils Relative %: 64 %
Platelet Count: 160 10*3/uL (ref 150–400)
RBC: 5.18 MIL/uL (ref 4.22–5.81)
RDW: 16.5 % — ABNORMAL HIGH (ref 11.5–15.5)
WBC Count: 5 10*3/uL (ref 4.0–10.5)
nRBC: 0 % (ref 0.0–0.2)

## 2022-04-14 LAB — MAGNESIUM: Magnesium: 1.6 mg/dL — ABNORMAL LOW (ref 1.7–2.4)

## 2022-04-14 NOTE — Progress Notes (Signed)
HEMATOLOGY/ONCOLOGY CLINIC NOTE  Date of Service: 04/14/2022   Patient Care Team: [name], DO as PCP - General (Family Medicine)  CHIEF COMPLAINTS/PURPOSE OF CONSULTATION:  F/u continued evaluation and management for [place] on Lenvatinib  HISTORY OF PRESENTING ILLNESS:  Please see previous note for details on initial presentation  INTERVAL HISTORY: Aaron [name] is a 76 y.o. male here for follow-up for [facility]. He reports He is doing well with no new symptoms or concerns.  He notes no recent falls.  He reports some recent constipation that he has been taking a stool softener for and notes that helps.  He notes intermittent pain in left hip. We discussed potentially getting an x-ray today which he has declined at this time and he notes he will monitor and make a decision at a later date.  No fever, chills, night sweats. No new lumps, bumps, or lesions/rashes. No abdominal pain or change in bowel habits. No new or unexpected weight loss. No SOB or chest pain. No other new or acute focal symptoms.  Labs done today were reviewed in detail.  We discussed his recent MRI abd w wo contrast done 04/09/2022.  MEDICAL HISTORY:  Past Medical History:  Diagnosis Date   Arthritis    Diabetes mellitus without complication ([place])    type II    Dyspnea    Elevated liver enzymes    Fatty liver    Hepatitis C    genotype 1A status post treatment with Linzie Collin and Ribavarin for 24 weeks   Hepatocellular carcinoma ([place]) 01/30/2014   Path   History of colon polyps    Hyperlipidemia    Hypertension    Iron deficiency anemia    hx of    MGUS (monoclonal gammopathy of unknown significance)    Obesity Hepatocellular carcinoma treated with TACE and percutaneous thermal ablation by interventional radiology. Last MRI on 08/06/2015 showed slight decrease in the size of the ablation defect involving the right lobe of the liver area and no findings to suggest residual or recurrent  hepatocellular carcinoma. Mild changes of liver cirrhosis. MRI Abd 06/24/2016- no evidence of recurrent HCC   Hepatitis C genotype 1A status post treatment with Viekira and Ribavarin for 24 weeks.  Monoclonal gammopathy of undetermined significance.  SURGICAL HISTORY:  Status post microwave ablation[October 2015] of liver lesion and TACE [July 2015] for [place]. EGD and colonoscopy in 2016   SOCIAL HISTORY: Social History   Socioeconomic History   Marital status: Married    Spouse name: Not on file   Number of children: Not on file   Years of education: Not on file   Highest education level: Not on file  Occupational History   Not on file  Tobacco Use   Smoking status: Former    Types: Cigarettes    Quit date: 01/30/1994    Years since quitting: 28.2   Smokeless tobacco: Never   Tobacco comments:    stopped 25 yrs ago  Vaping Use   Vaping Use: Never used  Substance and Sexual Activity   Alcohol use: No    Comment: stopped 30 years ago    Drug use: No   Sexual activity: Not Currently  Other Topics Concern   Not on file  Social History Narrative   Not on file   Social Determinants of Health   Financial Resource Strain: Not on file  Food Insecurity: Not on file  Transportation Needs: Not on file  Physical Activity: Not on file  Stress:  Not on file  Social Connections: Not on file  Intimate Partner Violence: Not on file  Former smoker and smoked 1 pack per day for about 20 years starting at age 62 quit 48 years ago.  FAMILY HISTORY: No family history on file.  ALLERGIES:  has No Known Allergies.  MEDICATIONS:  Current Outpatient Medications  Medication Sig Dispense Refill   amLODipine (NORVASC) 10 MG tablet Take 1 tablet (10 mg total) by mouth daily. 30 tablet 5   aspirin EC 81 MG tablet Take 81 mg by mouth every morning.      cholecalciferol (VITAMIN D) 1000 UNITS tablet Take 1,000 Units by mouth every morning.      glipiZIDE (GLUCOTROL XL) 2.5 MG 24 hr tablet  Take 2.5 mg by mouth daily with breakfast.     Insulin Glargine (LANTUS SOLOSTAR) 100 UNIT/ML Solostar Pen Inject 32 Units into the skin daily.     lenvatinib 12 mg daily dose (LENVIMA, 12 MG DAILY DOSE,) 3 x 4 MG capsule TAKE 3 CAPSULES (12MG) BY MOUTH DAILY. 90 each 2   Metoprolol Succinate 50 MG CS24 1 capsule     Multiple Vitamin (MULTIVITAMIN WITH MINERALS) TABS tablet Take 1 tablet daily by mouth.     simvastatin (ZOCOR) 20 MG tablet Take 20 mg by mouth daily.     ACCU-CHEK AVIVA PLUS test strip      Accu-Chek Softclix Lancets lancets      PAZEO 0.7 % SOLN Place 1 drop into both eyes every morning.  3   Current Facility-Administered Medications  Medication Dose Route Frequency Provider Last Rate Last Admin   oxyCODONE (Oxy IR/ROXICODONE) immediate release tablet 5 mg  5 mg Oral Q6H PRN Jacqualine Mau, NP       Facility-Administered Medications Ordered in Other Visits  Medication Dose Route Frequency Provider Last Rate Last Admin   dexamethasone (DECADRON) injection 8 mg  8 mg Intravenous Once Narda Rutherford T, NP       ondansetron [facility]) 8 mg in sodium chloride 0.9 % 50 mL IVPB  8 mg Intravenous Once Narda Rutherford T, NP       pantoprazole (PROTONIX) injection 40 mg  40 mg Intravenous Once Narda Rutherford T, NP        REVIEW OF SYSTEMS:   .10 Point review of Systems was done is negative except as noted above.  PHYSICAL EXAMINATION: Vitals:   04/14/22 1249  BP: (!) 140/74  Pulse: 68  Resp: 17  Temp: 97.8 F (36.6 C)  SpO2: 98%   NAD GENERAL:alert, in no acute distress and comfortable SKIN: no acute rashes, no significant lesions EYES: conjunctiva are pink and non-injected, sclera anicteric NECK: supple, no JVD LYMPH:  no palpable lymphadenopathy in the cervical, axillary or inguinal regions LUNGS: clear to auscultation b/l with normal respiratory effort HEART: regular rate & rhythm ABDOMEN:  normoactive bowel sounds , non tender, not distended. Extremity: no pedal  edema PSYCH: alert & oriented x 3 with fluent speech NEURO: no focal motor/sensory deficits   LABORATORY DATA:  I have reviewed the data as listed     Latest Ref Rng & Units 04/14/2022   12:23 PM 02/09/2022   12:28 PM 02/05/2022    8:11 AM  CBC  WBC 4.0 - 10.5 K/uL 5.0  8.4  5.6   Hemoglobin 13.0 - 17.0 g/dL 12.1  11.0  11.2   Hematocrit 39.0 - 52.0 % 38.3  34.8  36.9   Platelets 150 - 400 K/uL 160  208  217     CBC    Component Value Date/Time   WBC 5.0 04/14/2022 1223   WBC 5.6 02/05/2022 0811   RBC 5.18 04/14/2022 1223   HGB 12.1 (L) 04/14/2022 1223   HGB 11.5 (L) 03/22/2017 1402   HCT 38.3 (L) 04/14/2022 1223   HCT 37.0 (L) 03/22/2017 1402   PLT 160 04/14/2022 1223   PLT 262 03/22/2017 1402   MCV 73.9 (L) 04/14/2022 1223   MCV 72.3 (L) 03/22/2017 1402   MCH 23.4 (L) 04/14/2022 1223   MCHC 31.6 04/14/2022 1223   RDW 16.5 (H) 04/14/2022 1223   RDW 15.6 (H) 03/22/2017 1402   LYMPHSABS 1.2 04/14/2022 1223   LYMPHSABS 1.8 03/22/2017 1402   MONOABS 0.5 04/14/2022 1223   MONOABS 0.4 03/22/2017 1402   EOSABS 0.1 04/14/2022 1223   EOSABS 0.1 03/22/2017 1402   BASOSABS 0.0 04/14/2022 1223   BASOSABS 0.0 03/22/2017 1402    .    Latest Ref Rng & Units 04/14/2022   12:23 PM 02/09/2022   12:28 PM 02/05/2022    8:11 AM  CMP  Glucose 70 - 99 mg/dL 97  195  123   BUN 8 - 23 mg/dL 40  37  37   Creatinine 0.61 - 1.24 mg/dL 2.59  2.13  2.16   Sodium 135 - 145 mmol/L 138  135  142   Potassium 3.5 - 5.1 mmol/L 4.2  4.0  4.2   Chloride 98 - 111 mmol/L 109  102  110   CO2 22 - 32 mmol/L _0 Calcium 8.9 - 10.3 mg/dL 9.3  9.4  9.1   Total Protein 6.5 - 8.1 g/dL 7.7  7.7  8.1   Total Bilirubin 0.3 - 1.2 mg/dL 0.5  0.8  0.8   Alkaline Phos 38 - 126 U/L 96  152  124   AST 15 - 41 U/L 34  77  52   ALT 0 - 44 U/L 26  64  45       06/21/2019 MR ABDOMEN WWO CONTRAST (Accession [id number])     IFE 1  Comment    Comments: Immunofixation shows IgG monoclonal protein  with kappa light chain  specificity.           RADIOGRAPHIC STUDIES:  MRI abd w and wo contrast: 12/17/2016: IMPRESSION: 1. Postprocedural changes of thermal ablation again noted in the right lobe of the liver, without definitive evidence to suggest local recurrence of disease. No new hepatic lesions are noted. 2. Additional incidental findings, as above.     Electronically Signed   By: Vinnie Langton M.D.   On: 12/17/2016 09:42    MRI abd w and wo contrast 01/26/2020 IMPRESSION: 1. Interval enlargement of multiple hepatic masses as detailed above, consistent with progression of multifocal hepatocellular carcinoma. There is evidence of prior ablation in hepatic segments VII and VIII. 2. No evidence of metastatic disease in the abdomen.   Electronically Signed   By: Eddie Candle M.D.   On: 01/27/2020 15:51  MRI abd and wo contrast 10/04/2021 FINDINGS: Lower chest: Unremarkable   Hepatobiliary: Multiple liver lesions are present, some of these represent previously treated tumors and some represent active enhancing malignancy.   An index solid enhancing lesion in the lateral segment left hepatic lobe measures 3.4 by 2.8 cm on image 15 series 2, formerly 3.5 by 2.7 cm, essentially stable. Just below this a T2 hyperintense lesion measuring 1.5 by 1.1  cm on image 18 of series 2 previously measured 1.4 by 1.2 cm, likewise stable.   A currently enhancing lesion in the right hepatic lobe adjacent to the IVC measures 1.8 by 1.6 cm on image 13 series 2, stable.   A mostly centrally necrotic a 3.6 by 3.1 cm mostly centrally necrotic lesion in segment 4a of the liver on image 29 of series 21 previously measured 4.3 by 3.0 cm. This lesion is mostly centrally necrotic today although with some nodular enhancement along its margin as on image 29 series 21.   Previously treated right hepatic lobe lesions are observed, and mostly nonenhancing or poorly enhancing.   A somewhat  indistinctly marginated lesion posteriorly in segment 4a of the liver is diffusely enhancing and measures about 2.9 by 2.8 cm on image 28 of series 21. A previous faintly T2 hyperintense lesion in this vicinity on 02/20/2021 measured 2.0 by 1.6 cm, and accordingly this represents enlargement of this enhancing mass.   An enhancing mass laterally in segment 3 of the liver measures 3.0 by 2.6 cm on image 31 series 8, formerly 2.9 by 2.6 cm, essentially stable.   No biliary dilatation is observed.   Pancreas:  Unremarkable   Spleen:  Unremarkable   Adrenals/Urinary Tract:  Unremarkable   Stomach/Bowel: Unremarkable   Vascular/Lymphatic: Atherosclerosis is present, including aortoiliac atherosclerotic disease.   Other:  No supplemental non-categorized findings.   Musculoskeletal: Unchanged mild lower lumbar spondylosis and degenerative disc disease.   IMPRESSION: 1. Mixed appearance, with some of the enhancing liver lesions stable; some improved such as the centrally necrotic anterior segment 4A lesion which demonstrates substantial central necrosis compared to previous; but with at least 1 worsened lesion which is the posterior segment 4a lesion (image 29, series 20), moderately increased in size. 2. Aortic Atherosclerosis (ICD10-I70.0). 3. Mild lower lumbar spondylosis and degenerative disc disease.  Electronically Signed   By: Van Clines M.D.   On: 10/06/2021 11:08  ASSESSMENT & PLAN:   76 y.o. male with  #1 Monoclonal gammopathy of undetermined significance.  M protein 0.2 mg/dL immunofixation showing IgG monoclonal protein with kappa light chain specificity. SPEP 02/2016 - 0.3g/dl  No overt anemia.  No overt hypercalcemia. Stable CKD creatinine 1.6  #2 bone lucencies in the right radial mid midshaft and left humeral head. PET/CT did not show any hypermetabolic bone lesions. MRI left shoulder showed no evidence of metastatic disease or multiple myeloma. The  x-ray findings appear to be areas of mild demineralization. Patient was seen by orthopedics and no additional recommendations were given.  Plan -Patient's anemia is stable. No significant change in renal function. No new bone pains. No hypercalcemia . No overall no overt evidence of progression of multiple myeloma. -Myeloma labs from today show stable M protein @ 0.2g/dl with no evidence of progression- consistent with MGUS.  #3 Hepatocellular carcinoma treated with TACE and percutaneous thermal ablation , Y 90 and recent bland arterial embolization on 06/06/2021 by interventional radiology.   Last MRI on 08/06/2015 showed slight decrease in the size of the ablation defect involving the right lobe of the liver area and no findings to suggest residual or recurrent hepatocellular carcinoma. Mild changes of liver cirrhosis.  01/16/2016 MRI Abdomen showed resolution of previously ablated lesion but a new 1.3 cm lesion was noted which was subsequently ablated under CT guidance by interventional radiology on 01/31/2016. Elevated transaminases on 02/01/2016 were likely related to his ablation. These have since resolved.   06/24/2016 MRI Abdomen showed  no evidence of recurrent hepatocellular carcinoma . 12/17/2016 MRI Abdomen shows no evidence of recurrent hepatocellular carcinoma .  10/26/17 MRI Abdomen revealed Motion degraded images. Status post thermal ablation of the segment 8 lesion, without enhancement to suggest residual viable tumor. Prior ablation of a segment 7 lesion, grossly unchanged. No convincing central enhancement  03/14/2019  MRI abdomen w and wo contrast revealed "1. New areas of restricted diffusion, surrounding nodular arterial phase enhancement and delayed washout surrounding the ablation zone defects anteriorly in segments 8 and 7 consistent with local recurrence of hepatocellular carcinoma. 2. The peripheral ablation zone defect laterally in segment 8 is unchanged. 3. No extrahepatic  tumor identified."  10/02/2019 MRI Abdomen ([id number]) revealed "1. Progressive multifocal hepatocellular carcinoma, especially anteriorly around the ablated lesion in segments 4B and 8. There are additional new lesions in segments 2 and 3. No evidence of extrahepatic metastatic disease."  01/26/2020 MRI Abdomen ([id number]) revealed "1. Interval enlargement of multiple hepatic masses as detailed above, consistent with progression of multifocal hepatocellular carcinoma. There is evidence of prior ablation in hepatic segments VII and VIII. 2. No evidence of metastatic disease in the abdomen."  10/04/2021 MRI Abdomen ([id number]) revealed "1. Mixed appearance, with some of the enhancing liver lesions stable; some improved such as the centrally necrotic anterior segment 4A lesion which demonstrates substantial central necrosis compared to previous; but with at least 1 worsened lesion which is the posterior segment 4a lesion (image 29, series 20), moderately increased in size."  04/09/2022 MRI Abdomen ([id number]) revealed "1. Again noted are multiple lesions involving both lobes of liver compatible with multifocal hepatocellular carcinoma. When compared with the previous exam there no significant change in the overall tumor volume from 10/04/2021. 2. Interval treatment response with hypoenhancement of lesion within the segment 3 and the small lesion within dome of segment 2. 3. The remaining lesions continue to exhibit variable degrees of internal enhancement. 4. There is a new enhancing lesion within the dome of liver (segment 8/4a). 5. Aortic Atherosclerosis"  #4 Hepatitis C genotype 1A status post treatment with Viekira and Ribavarin for 24 weeks.   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    PLAN:  -Lab results today 02/09/2022 showed stable hemoglobin of 12.1 with a normal WBC count and platelets. CMP stable with stable  creatinine of 2.59. Magnesium 1.6. AFP tumor marker up from 381 to 504 -Patient had bland embolization on 02/05/2022 for his hepatocellular carcinoma. -if broader progression not addressable with locally directed treatments then will need to consider EBRT and change in systemic treatments. -continue f/u with pcp for continued optimization of HTN and DM2 and CKD. -We discussed potentially getting an x-ray today which he has declined at this time and he notes he will monitor and make a decision at a later date. -We discussed his recent MRI abd w wo contrast done 04/09/2022 which revealed no significant changes from previous scan. There is however one new enhancing lesion within the dome of the liver that we will monitor further. -continue Lenvima 86m po daily FOLLOW UP: MRI abd in 9 weeks RTC with Dr KIrene Limbowith labs in 10 weeks  The total time spent in the appointment was 20 minutes*.  All of the patient's questions were answered with apparent satisfaction. The patient knows to call the clinic with any problems, questions or concerns.   GSullivan LoneMD MS AAHIVMS SSouth[facility] Endoscopy LtdCMemorial Hospital AssociationHematology/Oncology Physician [facility] .*Total Encounter Time as  defined by the Centers for Medicare and Medicaid Services includes, in addition to the face-to-face time of a patient visit (documented in the note above) non-face-to-face time: obtaining and reviewing outside history, ordering and reviewing medications, tests or procedures, care coordination (communications with other health care professionals or caregivers) and documentation in the medical record.  I, Melene Muller, am acting as scribe for Dr. [name], MD.  .I have reviewed the above documentation for accuracy and completeness, and I agree with the above.Brunetta Genera MD

## 2022-04-14 NOTE — Progress Notes (Signed)
Chief Complaint: Patient was consulted remotely today ([place]) for follow-up hepatocellular carcinoma   Referring Physician(s): [name]  History of Present Illness: Aaron [name] is a 76 y.o. male with multifocal hepatocellular carcinoma.  I have been treating this patient for hepatocellular carcinoma since 2015.  He has had multiple procedures including microwave ablation, chemoembolization, radioembolization and bland embolization.  Patient underwent Y90 radioembolization of the right hepatic lobe on 06/18/2020, Y90 radioembolization to the central left hepatic lobe on 03/01/2020 and Y90 radioembolization to the lateral left hepatic artery on 11/19/2020.  Most recently, the patient underwent bland embolization of the accessory left hepatic artery on 02/05/2022.  Patient tolerated the most recent embolization procedure well.  Patient reports no significant change in his overall health.  His appetite remains good and no significant change in weight.  He had mild stomach pain a few days ago but that has resolved.  He has mild constipation.  No urinary problems at this time.  He denies fevers or chills.  He remains active and is still working in his workshop.  Patient is scheduled to follow-up with Dr. Irene Limbo in oncology today.  Past Medical History:  Diagnosis Date   Arthritis    Diabetes mellitus without complication ([place])    type II    Dyspnea    Elevated liver enzymes    Fatty liver    Hepatitis C    genotype 1A status post treatment with Linzie Collin and Ribavarin for 24 weeks   Hepatocellular carcinoma (HCC) 01/30/2014   Path   History of colon polyps    Hyperlipidemia    Hypertension    Iron deficiency anemia    hx of    MGUS (monoclonal gammopathy of unknown significance)     Past Surgical History:  Procedure Laterality Date   COLONOSCOPY     ESOPHAGOGASTRODUODENOSCOPY  2016   IR 3D INDEPENDENT WKST  01/23/2022   IR ANGIOGRAM SELECTIVE EACH ADDITIONAL VESSEL  04/04/2019    IR ANGIOGRAM SELECTIVE EACH ADDITIONAL VESSEL  04/04/2019   IR ANGIOGRAM SELECTIVE EACH ADDITIONAL VESSEL  04/04/2019   IR ANGIOGRAM SELECTIVE EACH ADDITIONAL VESSEL  04/04/2019   IR ANGIOGRAM SELECTIVE EACH ADDITIONAL VESSEL  04/04/2019   IR ANGIOGRAM SELECTIVE EACH ADDITIONAL VESSEL  04/04/2019   IR ANGIOGRAM SELECTIVE EACH ADDITIONAL VESSEL  04/04/2019   IR ANGIOGRAM SELECTIVE EACH ADDITIONAL VESSEL  05/16/2019   IR ANGIOGRAM SELECTIVE EACH ADDITIONAL VESSEL  05/16/2019   IR ANGIOGRAM SELECTIVE EACH ADDITIONAL VESSEL  05/16/2019   IR ANGIOGRAM SELECTIVE EACH ADDITIONAL VESSEL  02/14/2020   IR ANGIOGRAM SELECTIVE EACH ADDITIONAL VESSEL  02/14/2020   IR ANGIOGRAM SELECTIVE EACH ADDITIONAL VESSEL  02/14/2020   IR ANGIOGRAM SELECTIVE EACH ADDITIONAL VESSEL  02/14/2020   IR ANGIOGRAM SELECTIVE EACH ADDITIONAL VESSEL  03/01/2020   IR ANGIOGRAM SELECTIVE EACH ADDITIONAL VESSEL  03/01/2020   IR ANGIOGRAM SELECTIVE EACH ADDITIONAL VESSEL  03/01/2020   IR ANGIOGRAM SELECTIVE EACH ADDITIONAL VESSEL  03/01/2020   IR ANGIOGRAM SELECTIVE EACH ADDITIONAL VESSEL  03/01/2020   IR ANGIOGRAM SELECTIVE EACH ADDITIONAL VESSEL  06/18/2020   IR ANGIOGRAM SELECTIVE EACH ADDITIONAL VESSEL  06/18/2020   IR ANGIOGRAM SELECTIVE EACH ADDITIONAL VESSEL  11/19/2020   IR ANGIOGRAM SELECTIVE EACH ADDITIONAL VESSEL  11/19/2020   IR ANGIOGRAM SELECTIVE EACH ADDITIONAL VESSEL  06/06/2021   IR ANGIOGRAM SELECTIVE EACH ADDITIONAL VESSEL  06/06/2021   IR ANGIOGRAM SELECTIVE EACH ADDITIONAL VESSEL  06/06/2021   IR ANGIOGRAM SELECTIVE EACH ADDITIONAL VESSEL  06/06/2021   IR  ANGIOGRAM SELECTIVE EACH ADDITIONAL VESSEL  01/23/2022   IR ANGIOGRAM SELECTIVE EACH ADDITIONAL VESSEL  01/23/2022   IR ANGIOGRAM SELECTIVE EACH ADDITIONAL VESSEL  02/05/2022   IR ANGIOGRAM SELECTIVE EACH ADDITIONAL VESSEL  02/05/2022   IR ANGIOGRAM VISCERAL SELECTIVE  04/04/2019   IR ANGIOGRAM VISCERAL SELECTIVE  05/16/2019   IR ANGIOGRAM VISCERAL SELECTIVE  02/14/2020   IR ANGIOGRAM  VISCERAL SELECTIVE  02/14/2020   IR ANGIOGRAM VISCERAL SELECTIVE  03/01/2020   IR ANGIOGRAM VISCERAL SELECTIVE  06/18/2020   IR ANGIOGRAM VISCERAL SELECTIVE  11/19/2020   IR ANGIOGRAM VISCERAL SELECTIVE  01/23/2022   IR ANGIOGRAM VISCERAL SELECTIVE  02/05/2022   IR EMBO ARTERIAL NOT HEMORR HEMANG INC GUIDE ROADMAPPING  02/14/2020   IR EMBO TUMOR ORGAN ISCHEMIA INFARCT INC GUIDE ROADMAPPING  04/04/2019   IR EMBO TUMOR ORGAN ISCHEMIA INFARCT INC GUIDE ROADMAPPING  05/16/2019   IR EMBO TUMOR ORGAN ISCHEMIA INFARCT INC GUIDE ROADMAPPING  03/01/2020   IR EMBO TUMOR ORGAN ISCHEMIA INFARCT INC GUIDE ROADMAPPING  06/18/2020   IR EMBO TUMOR ORGAN ISCHEMIA INFARCT INC GUIDE ROADMAPPING  11/19/2020   IR EMBO TUMOR ORGAN ISCHEMIA INFARCT INC GUIDE ROADMAPPING  06/06/2021   IR EMBO TUMOR ORGAN ISCHEMIA INFARCT INC GUIDE ROADMAPPING  02/05/2022   IR GENERIC HISTORICAL  02/26/2016   IR RADIOLOGIST EVAL & MGMT 02/26/2016 Markus Daft, MD [facility]   IR GENERIC HISTORICAL  01/16/2016   IR RADIOLOGIST EVAL & MGMT 01/16/2016 Markus Daft, MD [facility]   IR GENERIC HISTORICAL  06/24/2016   IR RADIOLOGIST EVAL & MGMT 06/24/2016 Aletta Edouard, MD [facility]   IR RADIOLOGIST EVAL & MGMT  12/17/2016   IR RADIOLOGIST EVAL & MGMT  06/16/2017   IR RADIOLOGIST EVAL & MGMT  09/08/2017   IR RADIOLOGIST EVAL & MGMT  10/26/2017   IR RADIOLOGIST EVAL & MGMT  05/12/2018   IR RADIOLOGIST EVAL & MGMT  07/26/2018   IR RADIOLOGIST EVAL & MGMT  03/22/2019   IR RADIOLOGIST EVAL & MGMT  04/11/2019   IR RADIOLOGIST EVAL & MGMT  06/28/2019   IR RADIOLOGIST EVAL & MGMT  10/10/2019   IR RADIOLOGIST EVAL & MGMT  05/21/2020   IR RADIOLOGIST EVAL & MGMT  06/12/2020   IR RADIOLOGIST EVAL & MGMT  10/24/2020   IR RADIOLOGIST EVAL & MGMT  12/11/2020   IR RADIOLOGIST EVAL & MGMT  02/27/2021   IR RADIOLOGIST EVAL & MGMT  11/25/2021   IR RADIOLOGIST EVAL & MGMT  03/20/2022   IR US GUIDE VASC ACCESS LEFT  05/16/2019   IR US GUIDE VASC ACCESS LEFT  02/05/2022    IR US GUIDE VASC ACCESS RIGHT  04/04/2019   IR US GUIDE VASC ACCESS RIGHT  02/14/2020   IR US GUIDE VASC ACCESS RIGHT  03/01/2020   IR US GUIDE VASC ACCESS RIGHT  06/18/2020   IR US GUIDE VASC ACCESS RIGHT  11/19/2020   IR US GUIDE VASC ACCESS RIGHT  06/06/2021   IR US GUIDE VASC ACCESS RIGHT  01/23/2022   POLYPECTOMY     RADIOFREQUENCY ABLATION N/A 07/21/2017   Procedure: CT MICROWAVE THERMAL ABLATION-LIVER;  Surgeon: Markus Daft, MD;  Location: [facility];  Service: Anesthesiology;  Laterality: N/A;   RADIOFREQUENCY ABLATION N/A 06/29/2018   Procedure: CT MICROWAVE THERMAL ABLATION;  Surgeon: Markus Daft, MD;  Location: [facility];  Service: Anesthesiology;  Laterality: N/A;    Allergies: Patient has no known allergies.  Medications: Prior to Admission medications   Medication  Sig Start Date End Date Taking? Authorizing Provider  ACCU-CHEK AVIVA PLUS test strip  11/19/19   [provider]  Accu-Chek Softclix Lancets lancets  09/12/19   [provider]  amLODipine (NORVASC) 10 MG tablet Take 1 tablet (10 mg total) by mouth daily. 06/27/21   Brunetta Genera, MD  aspirin EC 81 MG tablet Take 81 mg by mouth every morning.     [provider]  cholecalciferol (VITAMIN D) 1000 UNITS tablet Take 1,000 Units by mouth every morning.     [provider]  glipiZIDE (GLUCOTROL XL) 2.5 MG 24 hr tablet Take 2.5 mg by mouth daily with breakfast.    [provider]  Insulin Glargine (LANTUS SOLOSTAR) 100 UNIT/ML Solostar Pen Inject 32 Units into the skin daily.    [provider]  lenvatinib 12 mg daily dose (LENVIMA, 12 MG DAILY DOSE,) 3 x 4 MG capsule TAKE 3 CAPSULES ('12MG'$ ) BY MOUTH DAILY. 03/12/22 03/12/23  Brunetta Genera, MD  Metoprolol Succinate 50 MG CS24 1 capsule    [provider]  Multiple Vitamin (MULTIVITAMIN WITH MINERALS) TABS tablet Take 1 tablet daily by mouth.    [provider]  PAZEO 0.7 % SOLN Place 1 drop into both  eyes every morning.  Patient not taking: Reported on 02/26/2021 05/09/18   [provider]  simvastatin (ZOCOR) 20 MG tablet Take 20 mg by mouth daily.    [provider]     No family history on file.  Social History   Socioeconomic History   Marital status: Married    Spouse name: Not on file   Number of children: Not on file   Years of education: Not on file   Highest education level: Not on file  Occupational History   Not on file  Tobacco Use   Smoking status: Former    Types: Cigarettes    Quit date: 01/30/1994    Years since quitting: 28.2   Smokeless tobacco: Never   Tobacco comments:    stopped 25 yrs ago  Vaping Use   Vaping Use: Never used  Substance and Sexual Activity   Alcohol use: No    Comment: stopped 30 years ago    Drug use: No   Sexual activity: Not Currently  Other Topics Concern   Not on file  Social History Narrative   Not on file   Social Determinants of Health   Financial Resource Strain: Not on file  Food Insecurity: Not on file  Transportation Needs: Not on file  Physical Activity: Not on file  Stress: Not on file  Social Connections: Not on file    ECOG Status: 1 - Symptomatic but completely ambulatory  Review of Systems  Constitutional: Negative.  Negative for chills and fever.  Gastrointestinal:  Positive for abdominal pain and constipation.  Genitourinary: Negative.     Physical Exam No direct physical exam was performed   Vital Signs: There were no vitals taken for this visit.  Imaging: MR ABDOMEN WWO CONTRAST  Result Date: 04/10/2022 CLINICAL DATA:  Status post Y-. History of multiple percutaneous and catheter directed liver therapies. 90 for multifocal hepatocellular carcinoma EXAM: MRI ABDOMEN WITHOUT AND WITH CONTRAST TECHNIQUE: Multiplanar multisequence MR imaging of the abdomen was performed both before and after the administration of intravenous contrast. CONTRAST:  50m GADAVIST GADOBUTROL 1 MMOL/ML  IV SOLN COMPARISON:  10/04/2021 FINDINGS: Lower chest: No acute abnormality. Hepatobiliary: Multiple liver lesions are present. These include previously treated lesions as  well as active enhancing lesions. -partially enhancing lesion in the lateral segment of left hepatic lobe measures 3.2 by 2.8 cm, image 30/16. Formally this demonstrated diffuse enhancement measured 3.4 x 2.8 cm. -Just inferior to the above lesion is a 0.9 x 1.0 cm hypoenhancing lesion, image 34/16. On the previous exam this show diffuse enhancement measuring 1.3 x 1.4 cm (when remeasured). - previous enhancing lesion within the right hepatic lobe adjacent to the IVC measures 1.7 by 1.5 cm, image 30/20. On the previous exam this measured 1.8 x 1.6 cm. -Partially necrotic lesion in segment 4A of the liver measures 3.7 x 2.6 cm, image 20/16. Previously 3.6 x 3.0 cm. -enhancing lesion within the posterior aspect of segment 4A of the liver measures the 3.5 x 2.8 cm, image 20/16. Formally this measured 2.9 x 2.8 cm. -Hypoenhancing lesion within segment 3 measures 2.9 x 2.2 cm, image 24/16. Previously enhancing this measured 3.0 x 2.6 cm. -treated lesion within the periphery of the right hepatic lobe is unchanged without signs of internal enhancement measuring 4.0 x 2.6 cm, image 24/16. -Adjacent to ablation zone defect within the anterior aspect of segment 8/4a is a new small round enhancing lesion which measures 2.4 x 1.8 cm, image 24/16. Gallbladder appears decompressed.  No biliary dilatation. Pancreas: No mass, inflammatory changes, or other parenchymal abnormality identified. Spleen:  Within normal limits in size and appearance. Adrenals/Urinary Tract: Normal adrenal glands. No kidney mass, hydronephrosis. Stomach/Bowel: Visualized portions within the abdomen are unremarkable. Vascular/Lymphatic: Normal appearance of the abdominal aorta. The portal vein remains patent. No adenopathy identified Other: None none Musculoskeletal: No suspicious bone  lesions identified. IMPRESSION: 1. Again noted are multiple lesions involving both lobes of liver compatible with multifocal hepatocellular carcinoma. When compared with the previous exam there no significant change in the overall tumor volume from 10/04/2021. 2. Interval treatment response with hypoenhancement of lesion within the segment 3 and the small lesion within dome of segment 2. 3. The remaining lesions continue to exhibit variable degrees of internal enhancement. 4. There is a new enhancing lesion within the dome of liver (segment 8/4a). 5.  Aortic Atherosclerosis (ICD10-I70.0). Electronically Signed   By: Kerby Moors M.D.   On: 04/10/2022 11:27   IR Radiologist Eval & Mgmt  Result Date: 03/20/2022 Please refer to notes tab for details about interventional procedure. (Op Note)   Labs:  CBC: Recent Labs    01/23/22 0808 01/29/22 1349 02/05/22 0811 02/09/22 1228  WBC 5.7 5.1 5.6 8.4  HGB 11.9* 11.4* 11.2* 11.0*  HCT 38.9* 36.3* 36.9* 34.8*  PLT 154 169 217 208    COAGS: Recent Labs    06/06/21 0734 01/23/22 0808 02/05/22 0811  INR 1.0 1.0 1.0    BMP: Recent Labs    01/23/22 0808 01/29/22 1349 02/05/22 0811 02/09/22 1228  NA 141 139 142 135  K 4.1 4.5 4.2 4.0  CL 112* 106 110 102  CO2 '22 28 23 25  '$ GLUCOSE 96 193* 123* 195*  BUN 28* 28* 37* 37*  CALCIUM 8.7* 9.6 9.1 9.4  CREATININE 2.31* 2.07* 2.16* 2.13*  GFRNONAA 29* 33* 31* 32*    LIVER FUNCTION TESTS: Recent Labs    01/23/22 0808 01/29/22 1349 02/05/22 0811 02/09/22 1228  BILITOT 0.8 0.4 0.8 0.8  AST 42* 40 52* 77*  ALT 28 31 45* 64*  ALKPHOS 86 116 124 152*  PROT 7.7 7.6 8.1 7.7  ALBUMIN 3.9 3.9 3.9 3.6    TUMOR MARKERS: No results for  input(s): "AFPTM", "CEA", "CA199", "CHROMGRNA" in the last 8760 hours.  Assessment and Plan:  76 year old with multifocal hepatocellular carcinoma.  Patient has undergone multiple liver directed therapies.  Most recent treatment was bland embolization to  the accessory left hepatic artery that was supplying multiple tumors in the left hepatic lobe.  Patient is also taking kinase inhibitor managed by Dr. Irene Limbo.  Abdominal MRI from 04/09/2022 demonstrates good treatment response in the left hepatic lobe.  There is clearly decreased enhancement to many of the left hepatic lesions although there is evidence for residual tumor nodularity in some of these lesions.  In addition, there are small lesions in the central aspect of the liver that are likely receiving blood supply from both left and right hepatic arteries based on the previous cone beam CT images.  Overall, the patient's tumor vascularity is complex which makes it difficult to treat.  Patient has already received a large Y90 dose to the central aspect of the left hepatic artery distribution and I would like to avoid Y90 radioembolization in this area.  Will try to manage the liver disease with catheter directed bland embolization/chemoembolization if possible but the right hepatic lobe could receive another dose of Y90 radioembolization if needed.  Patient is scheduled to see oncology today and scheduled to get follow-up labs including AFP.  If the patient's AFP level has decreased since the last treatment, plan for 46-monthfollow-up with MRI and labs.  If the AFP level has not improved, I think we should consider retreating the accessory left hepatic artery and possibly targeting some of the right hepatic arteries that are supplying the central hepatic lesions using bland embolization.  These procedures are also dependent on the patient's liver and renal function.  Patient's liver function remains relatively well-preserved.  Patient's last creatinine was 2.13 but did not significantly change after the last liver directed therapy.  When I discussed additional liver directed treatments, the patient is understandably frustrated but he is willing to undergo additional procedures if needed.   Electronically  Signed: ABurman Riis8/22/2023, 8:34 AM   I spent a total of    10 Minutes in remote  clinical consultation, greater than 50% of which was counseling/coordinating care for hepatocellular carcinoma.    Visit type: Audio only (telephone). Audio (no video) only due to technical limitations. [facility] [place] This visit type was conducted due to national recommendations for restrictions regarding the COVID-19 Pandemic (e.g. social distancing).  This format is felt to be most appropriate for this patient at this time.  All issues noted in this document were discussed and addressed.   Patient ID: [id number] [name] male   DOB: 2Jul 25, 1947 76y.o.   MRN: [id number]

## 2022-04-15 LAB — AFP TUMOR MARKER: AFP, Serum, Tumor Marker: 504 ng/mL — ABNORMAL HIGH (ref 0.0–8.4)

## 2022-04-16 ENCOUNTER — Other Ambulatory Visit (HOSPITAL_COMMUNITY): Payer: Self-pay

## 2022-04-16 ENCOUNTER — Telehealth: Payer: Self-pay | Admitting: Hematology

## 2022-04-16 NOTE — Telephone Encounter (Signed)
Scheduled follow-up appointment per 8/22 los. Patient is aware.

## 2022-04-30 ENCOUNTER — Telehealth: Payer: Self-pay | Admitting: Diagnostic Radiology

## 2022-04-30 NOTE — Progress Notes (Signed)
Patient ID: Aaron [name], male   DOB: March 23, 1946, 76 y.o.   MRN: [id number] I called Mr. Aaron [name] today and last week to discuss his AFP level and additional liver directed therapy.  I got voicemail both times and waiting for patient to call back to clinic.

## 2022-05-07 ENCOUNTER — Other Ambulatory Visit (HOSPITAL_COMMUNITY): Payer: Self-pay

## 2022-05-15 ENCOUNTER — Other Ambulatory Visit: Payer: Self-pay

## 2022-05-15 ENCOUNTER — Telehealth: Payer: Self-pay | Admitting: Pharmacy Technician

## 2022-05-15 ENCOUNTER — Other Ambulatory Visit (HOSPITAL_COMMUNITY): Payer: Self-pay

## 2022-05-15 DIAGNOSIS — C220 Liver cell carcinoma: Secondary | ICD-10-CM

## 2022-05-15 NOTE — Telephone Encounter (Signed)
Oral Oncology Patient Advocate Encounter   Was successful in securing patient a $10,000 grant from [name] (PAF) to provide copayment coverage for Lenvima.  This will keep the out of pocket expense at $0.     I have spoken with the patient.    The billing information is as follows and has been shared with [employer]   RxBin: [id number] PCN:  [id number] Member ID: [id number] Group ID: [id number] Dates of Eligibility: 11/16/2021 through 05/15/2023  Aaron [name], CPhT-Adv Oncology Pharmacy Patient [place] Direct Number: [phone]  Fax: [phone]

## 2022-05-18 ENCOUNTER — Other Ambulatory Visit (HOSPITAL_COMMUNITY): Payer: Self-pay | Admitting: Diagnostic Radiology

## 2022-05-18 DIAGNOSIS — C220 Liver cell carcinoma: Secondary | ICD-10-CM

## 2022-05-21 ENCOUNTER — Telehealth: Payer: Self-pay | Admitting: *Deleted

## 2022-05-21 NOTE — Chronic Care Management (AMB) (Signed)
  Care Coordination  Outreach Note  05/21/2022 Name: Aaron [name] MRN: [id number] DOB: 07/29/1946   Care Coordination Outreach Attempts: An unsuccessful telephone outreach was attempted today to offer the patient information about available care coordination services as a benefit of their health plan.   Follow Up Plan:  Additional outreach attempts will be made to offer the patient care coordination information and services.   Encounter Outcome:  No Answer  [place]  Direct Dial: [phone]

## 2022-05-26 NOTE — Chronic Care Management (AMB) (Signed)
  Care Coordination  Outreach Note  05/26/2022 Name: Audrick Lamoureaux MRN: [id number] DOB: [birth year]   Care Coordination Outreach Attempts: A second unsuccessful outreach was attempted today to offer the patient with information about available care coordination services as a benefit of their health plan.     Follow Up Plan:  Additional outreach attempts will be made to offer the patient care coordination information and services.   Encounter Outcome:  No Answer  [place]  Direct Dial: [phone]

## 2022-06-01 NOTE — Chronic Care Management (AMB) (Signed)
  Care Coordination  Outreach Note  06/01/2022 Name: Aaron [name] MRN: [id number] DOB: 1946/02/21   Care Coordination Outreach Attempts: A third unsuccessful outreach was attempted today to offer the patient with information about available care coordination services as a benefit of their health plan.   Follow Up Plan:  No further outreach attempts will be made at this time. We have been unable to contact the patient to offer or enroll patient in care coordination services  Encounter Outcome:  No Answer  [place]: [phone]

## 2022-06-04 ENCOUNTER — Other Ambulatory Visit: Payer: Self-pay | Admitting: Hematology

## 2022-06-04 ENCOUNTER — Other Ambulatory Visit (HOSPITAL_COMMUNITY): Payer: Self-pay

## 2022-06-04 MED ORDER — LENVATINIB (12 MG DAILY DOSE) 3 X 4 MG PO CPPK
ORAL_CAPSULE | ORAL | 2 refills | Status: DC
  Filled 2022-06-04: qty 90, 30d supply, fill #0
  Filled 2022-07-08: qty 90, 30d supply, fill #1
  Filled 2022-08-27: qty 90, 30d supply, fill #2

## 2022-06-16 ENCOUNTER — Other Ambulatory Visit (HOSPITAL_COMMUNITY): Payer: Self-pay

## 2022-06-17 ENCOUNTER — Other Ambulatory Visit (HOSPITAL_COMMUNITY): Payer: Self-pay

## 2022-06-18 ENCOUNTER — Other Ambulatory Visit: Payer: Self-pay | Admitting: Radiology

## 2022-06-18 DIAGNOSIS — C220 Liver cell carcinoma: Secondary | ICD-10-CM

## 2022-06-18 NOTE — H&P (Signed)
Referring Physician(s): Kale,G  Supervising Physician: Markus Daft  Patient Status:  [facility]  Chief Complaint: Multifocal hepatocellular carcinoma   Subjective: 76 year old with multifocal hepatocellular carcinoma.  Patient has undergone multiple liver directed therapies.  Most recent treatment was bland embolization to the accessory left hepatic artery that was supplying multiple tumors in the left hepatic lobe.  Patient is also taking kinase inhibitor managed by Dr. Irene Limbo.  Abdominal MRI from 04/09/2022 demonstrates good treatment response in the left hepatic lobe.  There is clearly decreased enhancement to many of the left hepatic lesions although there is evidence for residual tumor nodularity in some of these lesions.  In addition, there are small lesions in the central aspect of the liver that are likely receiving blood supply from both left and right hepatic arteries based on the previous cone beam CT images.  Overall, the patient's tumor vascularity is complex which makes it difficult to treat.  Patient has already received a large Y90 dose to the central aspect of the left hepatic artery distribution and I would like to avoid Y90 radioembolization in this area.  Will try to manage the liver disease with catheter directed bland embolization/chemoembolization if possible but the right hepatic lobe could receive another dose of Y90 radioembolization if needed. Most recent AFP is 504(381).  Following further d/w Dr. Anselm Pancoast pt presents now for retreatment of the accessory left hepatic artery and possibly  some of the right hepatic arteries that are supplying the central hepatic lesions using bland embolization. He currently denies fever, headache, chest pain, dyspnea, cough, abdominal/back pain, nausea, vomiting or bleeding.  Past Medical History:  Diagnosis Date   Arthritis    Diabetes mellitus without complication ([place])    type II    Dyspnea    Elevated liver enzymes    Fatty liver     Hepatitis C    genotype 1A status post treatment with Linzie Collin and Ribavarin for 24 weeks   Hepatocellular carcinoma (HCC) 01/30/2014   Path   History of colon polyps    Hyperlipidemia    Hypertension    Iron deficiency anemia    hx of    MGUS (monoclonal gammopathy of unknown significance)    Past Surgical History:  Procedure Laterality Date   COLONOSCOPY     ESOPHAGOGASTRODUODENOSCOPY  2016   IR 3D INDEPENDENT WKST  01/23/2022   IR ANGIOGRAM SELECTIVE EACH ADDITIONAL VESSEL  04/04/2019   IR ANGIOGRAM SELECTIVE EACH ADDITIONAL VESSEL  04/04/2019   IR ANGIOGRAM SELECTIVE EACH ADDITIONAL VESSEL  04/04/2019   IR ANGIOGRAM SELECTIVE EACH ADDITIONAL VESSEL  04/04/2019   IR ANGIOGRAM SELECTIVE EACH ADDITIONAL VESSEL  04/04/2019   IR ANGIOGRAM SELECTIVE EACH ADDITIONAL VESSEL  04/04/2019   IR ANGIOGRAM SELECTIVE EACH ADDITIONAL VESSEL  04/04/2019   IR ANGIOGRAM SELECTIVE EACH ADDITIONAL VESSEL  05/16/2019   IR ANGIOGRAM SELECTIVE EACH ADDITIONAL VESSEL  05/16/2019   IR ANGIOGRAM SELECTIVE EACH ADDITIONAL VESSEL  05/16/2019   IR ANGIOGRAM SELECTIVE EACH ADDITIONAL VESSEL  02/14/2020   IR ANGIOGRAM SELECTIVE EACH ADDITIONAL VESSEL  02/14/2020   IR ANGIOGRAM SELECTIVE EACH ADDITIONAL VESSEL  02/14/2020   IR ANGIOGRAM SELECTIVE EACH ADDITIONAL VESSEL  02/14/2020   IR ANGIOGRAM SELECTIVE EACH ADDITIONAL VESSEL  03/01/2020   IR ANGIOGRAM SELECTIVE EACH ADDITIONAL VESSEL  03/01/2020   IR ANGIOGRAM SELECTIVE EACH ADDITIONAL VESSEL  03/01/2020   IR ANGIOGRAM SELECTIVE EACH ADDITIONAL VESSEL  03/01/2020   IR ANGIOGRAM SELECTIVE EACH ADDITIONAL VESSEL  03/01/2020   IR ANGIOGRAM  SELECTIVE EACH ADDITIONAL VESSEL  06/18/2020   IR ANGIOGRAM SELECTIVE EACH ADDITIONAL VESSEL  06/18/2020   IR ANGIOGRAM SELECTIVE EACH ADDITIONAL VESSEL  11/19/2020   IR ANGIOGRAM SELECTIVE EACH ADDITIONAL VESSEL  11/19/2020   IR ANGIOGRAM SELECTIVE EACH ADDITIONAL VESSEL  06/06/2021   IR ANGIOGRAM SELECTIVE EACH ADDITIONAL VESSEL  06/06/2021   IR  ANGIOGRAM SELECTIVE EACH ADDITIONAL VESSEL  06/06/2021   IR ANGIOGRAM SELECTIVE EACH ADDITIONAL VESSEL  06/06/2021   IR ANGIOGRAM SELECTIVE EACH ADDITIONAL VESSEL  01/23/2022   IR ANGIOGRAM SELECTIVE EACH ADDITIONAL VESSEL  01/23/2022   IR ANGIOGRAM SELECTIVE EACH ADDITIONAL VESSEL  02/05/2022   IR ANGIOGRAM SELECTIVE EACH ADDITIONAL VESSEL  02/05/2022   IR ANGIOGRAM VISCERAL SELECTIVE  04/04/2019   IR ANGIOGRAM VISCERAL SELECTIVE  05/16/2019   IR ANGIOGRAM VISCERAL SELECTIVE  02/14/2020   IR ANGIOGRAM VISCERAL SELECTIVE  02/14/2020   IR ANGIOGRAM VISCERAL SELECTIVE  03/01/2020   IR ANGIOGRAM VISCERAL SELECTIVE  06/18/2020   IR ANGIOGRAM VISCERAL SELECTIVE  11/19/2020   IR ANGIOGRAM VISCERAL SELECTIVE  01/23/2022   IR ANGIOGRAM VISCERAL SELECTIVE  02/05/2022   IR EMBO ARTERIAL NOT HEMORR HEMANG INC GUIDE ROADMAPPING  02/14/2020   IR EMBO TUMOR ORGAN ISCHEMIA INFARCT INC GUIDE ROADMAPPING  04/04/2019   IR EMBO TUMOR ORGAN ISCHEMIA INFARCT INC GUIDE ROADMAPPING  05/16/2019   IR EMBO TUMOR ORGAN ISCHEMIA INFARCT INC GUIDE ROADMAPPING  03/01/2020   IR EMBO TUMOR ORGAN ISCHEMIA INFARCT INC GUIDE ROADMAPPING  06/18/2020   IR EMBO TUMOR ORGAN ISCHEMIA INFARCT INC GUIDE ROADMAPPING  11/19/2020   IR EMBO TUMOR ORGAN ISCHEMIA INFARCT INC GUIDE ROADMAPPING  06/06/2021   IR EMBO TUMOR ORGAN ISCHEMIA INFARCT INC GUIDE ROADMAPPING  02/05/2022   IR GENERIC HISTORICAL  02/26/2016   IR RADIOLOGIST EVAL & MGMT 02/26/2016 Markus Daft, MD [facility] INTER[facility] RAD   IR GENERIC HISTORICAL  01/16/2016   IR RADIOLOGIST EVAL & MGMT 01/16/2016 Markus Daft, MD [facility]   IR GENERIC HISTORICAL  06/24/2016   IR RADIOLOGIST EVAL & MGMT 06/24/2016 Aletta Edouard, MD [facility]   IR RADIOLOGIST EVAL & MGMT  12/17/2016   IR RADIOLOGIST EVAL & MGMT  06/16/2017   IR RADIOLOGIST EVAL & MGMT  09/08/2017   IR RADIOLOGIST EVAL & MGMT  10/26/2017   IR RADIOLOGIST EVAL & MGMT  05/12/2018   IR RADIOLOGIST EVAL & MGMT  07/26/2018   IR RADIOLOGIST EVAL &  MGMT  03/22/2019   IR RADIOLOGIST EVAL & MGMT  04/11/2019   IR RADIOLOGIST EVAL & MGMT  06/28/2019   IR RADIOLOGIST EVAL & MGMT  10/10/2019   IR RADIOLOGIST EVAL & MGMT  05/21/2020   IR RADIOLOGIST EVAL & MGMT  06/12/2020   IR RADIOLOGIST EVAL & MGMT  10/24/2020   IR RADIOLOGIST EVAL & MGMT  12/11/2020   IR RADIOLOGIST EVAL & MGMT  02/27/2021   IR RADIOLOGIST EVAL & MGMT  11/25/2021   IR RADIOLOGIST EVAL & MGMT  03/20/2022   IR RADIOLOGIST EVAL & MGMT  04/14/2022   IR US GUIDE VASC ACCESS LEFT  05/16/2019   IR US GUIDE VASC ACCESS LEFT  02/05/2022   IR US GUIDE VASC ACCESS RIGHT  04/04/2019   IR US GUIDE VASC ACCESS RIGHT  02/14/2020   IR US GUIDE VASC ACCESS RIGHT  03/01/2020   IR US GUIDE VASC ACCESS RIGHT  06/18/2020   IR US GUIDE VASC ACCESS RIGHT  11/19/2020   IR US GUIDE [place] ACCESS RIGHT  06/06/2021  IR US GUIDE VASC ACCESS RIGHT  01/23/2022   POLYPECTOMY     RADIOFREQUENCY ABLATION N/A 07/21/2017   Procedure: CT MICROWAVE THERMAL ABLATION-LIVER;  Surgeon: Markus Daft, MD;  Location: [facility];  Service: Anesthesiology;  Laterality: N/A;   RADIOFREQUENCY ABLATION N/A 06/29/2018   Procedure: CT MICROWAVE THERMAL ABLATION;  Surgeon: Markus Daft, MD;  Location: [facility];  Service: Anesthesiology;  Laterality: N/A;      Allergies: Patient has no known allergies.  Medications: Prior to Admission medications   Medication Sig Start Date End Date Taking? Authorizing Provider  ACCU-CHEK AVIVA PLUS test strip  11/19/19   [provider]  Accu-Chek Softclix Lancets lancets  09/12/19   [provider]  amLODipine (NORVASC) 10 MG tablet Take 1 tablet (10 mg total) by mouth daily. 06/27/21   Brunetta Genera, MD  aspirin EC 81 MG tablet Take 81 mg by mouth every morning.     [provider]  cholecalciferol (VITAMIN D) 1000 UNITS tablet Take 1,000 Units by mouth every morning.     [provider]  glipiZIDE (GLUCOTROL XL) 2.5 MG 24 hr tablet Take 2.5 mg by mouth daily with  breakfast.    [provider]  Insulin Glargine (LANTUS SOLOSTAR) 100 UNIT/ML Solostar Pen Inject 32 Units into the skin daily.    [provider]  lenvatinib 12 mg daily dose (LENVIMA) 3 x 4 MG capsule TAKE 3 CAPSULES ('12MG'$ ) BY MOUTH DAILY. 06/04/22 06/04/23  Brunetta Genera, MD  Metoprolol Succinate 50 MG CS24 1 capsule    [provider]  Multiple Vitamin (MULTIVITAMIN WITH MINERALS) TABS tablet Take 1 tablet daily by mouth.    [provider]  PAZEO 0.7 % SOLN Place 1 drop into both eyes every morning. 05/09/18   [provider]  simvastatin (ZOCOR) 20 MG tablet Take 20 mg by mouth daily.    [provider]     Vital Signs: Vitals:   06/19/22 0750  BP: (!) 157/72  Pulse: 62  Resp: 18  Temp: 98.2 F (36.8 C)  SpO2: 99%      Physical Exam awake, alert.  Chest clear to auscultation bilaterally.  Heart with regular rate and rhythm.  Abdomen soft, positive bowel sounds, nontender; extremities with full range of motion, no sig pedal edema  Imaging: No results found.  Labs:  CBC: Recent Labs    01/29/22 1349 02/05/22 0811 02/09/22 1228 04/14/22 1223  WBC 5.1 5.6 8.4 5.0  HGB 11.4* 11.2* 11.0* 12.1*  HCT 36.3* 36.9* 34.8* 38.3*  PLT 169 217 208 160    COAGS: Recent Labs    01/23/22 0808 02/05/22 0811  INR 1.0 1.0    BMP: Recent Labs    01/29/22 1349 02/05/22 0811 02/09/22 1228 04/14/22 1223  NA 139 142 135 138  K 4.5 4.2 4.0 4.2  CL 106 110 102 109  CO2 '28 23 25 24  '$ GLUCOSE 193* 123* 195* 97  BUN 28* 37* 37* 40*  CALCIUM 9.6 9.1 9.4 9.3  CREATININE 2.07* 2.16* 2.13* 2.59*  GFRNONAA 33* 31* 32* 25*    LIVER FUNCTION TESTS: Recent Labs    01/29/22 1349 02/05/22 0811 02/09/22 1228 04/14/22 1223  BILITOT 0.4 0.8 0.8 0.5  AST 40 52* 77* 34  ALT 31 45* 64* 26  ALKPHOS 116 124 152* 96  PROT 7.6 8.1 7.7 7.7  ALBUMIN 3.9 3.9 3.6 4.1    Assessment and Plan: 76 year old with multifocal  hepatocellular carcinoma.  Patient  has undergone multiple liver directed therapies.  Most recent treatment was bland embolization to the accessory left hepatic artery that was supplying multiple tumors in the left hepatic lobe.  Patient is also taking kinase inhibitor managed by Dr. Irene Limbo.  Abdominal MRI from 04/09/2022 demonstrates good treatment response in the left hepatic lobe.  There is clearly decreased enhancement to many of the left hepatic lesions although there is evidence for residual tumor nodularity in some of these lesions.  In addition, there are small lesions in the central aspect of the liver that are likely receiving blood supply from both left and right hepatic arteries based on the previous cone beam CT images.  Overall, the patient's tumor vascularity is complex which makes it difficult to treat.  Patient has already received a large Y90 dose to the central aspect of the left hepatic artery distribution and I would like to avoid Y90 radioembolization in this area.  Will try to manage the liver disease with catheter directed bland embolization/chemoembolization if possible but the right hepatic lobe could receive another dose of Y90 radioembolization if needed. Most recent AFP is 504(381).  Following further d/w Dr. Anselm Pancoast pt presents now for retreatment of the accessory left hepatic artery and possibly  some of the right hepatic arteries that are supplying the central hepatic lesions using bland embolization.Risks and benefits of procedure were discussed with the patient including, but not limited to bleeding, infection, vascular injury or contrast induced renal failure.  This interventional procedure involves the use of X-rays and because of the nature of the planned procedure, it is possible that we will have prolonged use of X-ray fluoroscopy.  Potential radiation risks to you include (but are not limited to) the following: - A slightly elevated risk for cancer  several years later in life.  This risk is typically less than 0.5% percent. This risk is low in comparison to the normal incidence of human cancer, which is 33% for women and 50% for men according to the [place]. - Radiation induced injury can include skin redness, resembling a rash, tissue breakdown / ulcers and hair loss (which can be temporary or permanent).   The likelihood of either of these occurring depends on the difficulty of the procedure and whether you are sensitive to radiation due to previous procedures, disease, or genetic conditions.   IF your procedure requires a prolonged use of radiation, you will be notified and given written instructions for further action.  It is your responsibility to monitor the irradiated area for the 2 weeks following the procedure and to notify your physician if you are concerned that you have suffered a radiation induced injury.    All of the patient's questions were answered, patient is agreeable to proceed.  Consent signed and in chart.   LABS PENDING   Electronically Signed: D. Rowe Robert, PA-C 06/18/2022, 2:57 PM   I spent a total of 20 minutes at the the patient's bedside AND on the patient's hospital floor or unit, greater than 50% of which was counseling/coordinating care for for hepatic arteriogram with bland embolization to treat hepatocellular carcinoma

## 2022-06-19 ENCOUNTER — Other Ambulatory Visit (HOSPITAL_COMMUNITY): Payer: Self-pay | Admitting: Diagnostic Radiology

## 2022-06-19 ENCOUNTER — Encounter (HOSPITAL_COMMUNITY): Payer: Self-pay

## 2022-06-19 ENCOUNTER — Ambulatory Visit (HOSPITAL_COMMUNITY)
Admission: RE | Admit: 2022-06-19 | Discharge: 2022-06-19 | Disposition: A | Discharge status: Home / Self Care | Payer: Medicare HMO | Source: Ambulatory Visit

## 2022-06-19 ENCOUNTER — Other Ambulatory Visit: Payer: Self-pay

## 2022-06-19 ENCOUNTER — Ambulatory Visit (HOSPITAL_COMMUNITY)
Admission: RE | Admit: 2022-06-19 | Discharge: 2022-06-19 | Disposition: A | Discharge status: Home / Self Care | Payer: Medicare HMO | Source: Ambulatory Visit | Attending: Diagnostic Radiology | Admitting: Diagnostic Radiology

## 2022-06-19 DIAGNOSIS — C220 Liver cell carcinoma: Secondary | ICD-10-CM | POA: Diagnosis not present

## 2022-06-19 HISTORY — PX: IR US GUIDE VASC ACCESS RIGHT: IMG2390

## 2022-06-19 HISTORY — PX: IR ANGIOGRAM SELECTIVE EACH ADDITIONAL VESSEL: IMG667

## 2022-06-19 HISTORY — PX: IR ANGIOGRAM VISCERAL SELECTIVE: IMG657

## 2022-06-19 LAB — COMPREHENSIVE METABOLIC PANEL
ALT: 24 U/L (ref 0–44)
AST: 43 U/L — ABNORMAL HIGH (ref 15–41)
Albumin: 3.6 g/dL (ref 3.5–5.0)
Alkaline Phosphatase: 78 U/L (ref 38–126)
Anion gap: 8 (ref 5–15)
BUN: 32 mg/dL — ABNORMAL HIGH (ref 8–23)
CO2: 23 mmol/L (ref 22–32)
Calcium: 8.8 mg/dL — ABNORMAL LOW (ref 8.9–10.3)
Chloride: 108 mmol/L (ref 98–111)
Creatinine, Ser: 2.04 mg/dL — ABNORMAL HIGH (ref 0.61–1.24)
GFR, Estimated: 33 mL/min — ABNORMAL LOW (ref >60)
Glucose, Bld: 112 mg/dL — ABNORMAL HIGH (ref 70–99)
Potassium: 4.7 mmol/L (ref 3.5–5.1)
Sodium: 139 mmol/L (ref 135–145)
Total Bilirubin: 0.9 mg/dL (ref 0.3–1.2)
Total Protein: 7.5 g/dL (ref 6.5–8.1)

## 2022-06-19 LAB — CBC WITH DIFFERENTIAL/PLATELET
Abs Immature Granulocytes: 0.03 10*3/uL (ref 0.00–0.07)
Basophils Absolute: 0 10*3/uL (ref 0.0–0.1)
Basophils Relative: 0 %
Eosinophils Absolute: 0.1 10*3/uL (ref 0.0–0.5)
Eosinophils Relative: 2 %
HCT: 38.8 % — ABNORMAL LOW (ref 39.0–52.0)
Hemoglobin: 11.7 g/dL — ABNORMAL LOW (ref 13.0–17.0)
Immature Granulocytes: 1 %
Lymphocytes Relative: 16 %
Lymphs Abs: 0.9 10*3/uL (ref 0.7–4.0)
MCH: 23.4 pg — ABNORMAL LOW (ref 26.0–34.0)
MCHC: 30.2 g/dL (ref 30.0–36.0)
MCV: 77.6 fL — ABNORMAL LOW (ref 80.0–100.0)
Monocytes Absolute: 0.6 10*3/uL (ref 0.1–1.0)
Monocytes Relative: 10 %
Neutro Abs: 4.1 10*3/uL (ref 1.7–7.7)
Neutrophils Relative %: 71 %
Platelets: 159 10*3/uL (ref 150–400)
RBC: 5 MIL/uL (ref 4.22–5.81)
RDW: 16.1 % — ABNORMAL HIGH (ref 11.5–15.5)
WBC: 5.7 10*3/uL (ref 4.0–10.5)
nRBC: 0 % (ref 0.0–0.2)

## 2022-06-19 LAB — GLUCOSE, CAPILLARY: Glucose-Capillary: 101 mg/dL — ABNORMAL HIGH (ref 70–99)

## 2022-06-19 LAB — PROTIME-INR
INR: 1 (ref 0.8–1.2)
Prothrombin Time: 13 seconds (ref 11.4–15.2)

## 2022-06-19 MED ORDER — MIDAZOLAM HCL 2 MG/2ML IJ SOLN
INTRAMUSCULAR | Status: AC
  Filled 2022-06-19: qty 4

## 2022-06-19 MED ORDER — LIDOCAINE HCL 1 % IJ SOLN
INTRAMUSCULAR | Status: AC
  Filled 2022-06-19: qty 20

## 2022-06-19 MED ORDER — MIDAZOLAM HCL 2 MG/2ML IJ SOLN
INTRAMUSCULAR | Status: AC | PRN
  Administered 2022-06-19 (×2): 1 mg via INTRAVENOUS

## 2022-06-19 MED ORDER — ONDANSETRON 8 MG/NS 50 ML IVPB
8.0000 mg | Freq: Once | INTRAVENOUS | Status: AC
  Administered 2022-06-19: 8 mg via INTRAVENOUS
  Filled 2022-06-19: qty 8

## 2022-06-19 MED ORDER — IOHEXOL 300 MG/ML  SOLN
50.0000 mL | Freq: Once | INTRAMUSCULAR | Status: AC | PRN
  Administered 2022-06-19: 25 mL via INTRA_ARTERIAL

## 2022-06-19 MED ORDER — HEPARIN SODIUM (PORCINE) 1000 UNIT/ML IJ SOLN
INTRAMUSCULAR | Status: AC
  Filled 2022-06-19: qty 10

## 2022-06-19 MED ORDER — DEXAMETHASONE SODIUM PHOSPHATE 10 MG/ML IJ SOLN
8.0000 mg | Freq: Once | INTRAMUSCULAR | Status: AC
  Administered 2022-06-19: 8 mg via INTRAVENOUS
  Filled 2022-06-19: qty 1

## 2022-06-19 MED ORDER — FENTANYL CITRATE (PF) 100 MCG/2ML IJ SOLN
INTRAMUSCULAR | Status: AC | PRN
  Administered 2022-06-19 (×2): 50 ug via INTRAVENOUS

## 2022-06-19 MED ORDER — SODIUM CHLORIDE 0.9 % IV SOLN: INTRAVENOUS | Status: DC

## 2022-06-19 MED ORDER — IOHEXOL 300 MG/ML  SOLN
50.0000 mL | Freq: Once | INTRAMUSCULAR | Status: AC | PRN
  Administered 2022-06-19: 15 mL via INTRA_ARTERIAL

## 2022-06-19 MED ORDER — LIDOCAINE HCL 1 % IJ SOLN
INTRAMUSCULAR | Status: AC | PRN
  Administered 2022-06-19 (×2): 5 mL

## 2022-06-19 MED ORDER — NITROGLYCERIN IN D5W 100-5 MCG/ML-% IV SOLN
INTRAVENOUS | Status: AC
  Filled 2022-06-19: qty 250

## 2022-06-19 MED ORDER — PANTOPRAZOLE SODIUM 40 MG IV SOLR
40.0000 mg | Freq: Once | INTRAVENOUS | Status: AC
  Administered 2022-06-19: 40 mg via INTRAVENOUS
  Filled 2022-06-19: qty 10

## 2022-06-19 MED ORDER — FENTANYL CITRATE (PF) 100 MCG/2ML IJ SOLN
INTRAMUSCULAR | Status: AC
  Filled 2022-06-19: qty 2

## 2022-06-19 MED ORDER — IOHEXOL 300 MG/ML  SOLN
50.0000 mL | Freq: Once | INTRAMUSCULAR | Status: AC | PRN
  Administered 2022-06-19: 10 mL via INTRAVENOUS

## 2022-06-19 MED ORDER — SODIUM CHLORIDE 0.9 % IV SOLN
2.0000 g | Freq: Once | INTRAVENOUS | Status: AC
  Administered 2022-06-19: 2 g via INTRAVENOUS
  Filled 2022-06-19: qty 2

## 2022-06-19 MED ORDER — VERAPAMIL HCL 2.5 MG/ML IV SOLN
INTRAVENOUS | Status: AC
  Filled 2022-06-19: qty 2

## 2022-06-19 NOTE — Procedures (Signed)
Interventional Radiology Procedure:   Indications: Multifocal hepatocellular carcinoma  Procedure: Visceral angiogram with selective catheterization of accessory left hepatic artery  Findings: Difficult to access left gastric artery.  Once the left gastric artery was accessed it was difficult to advance microcatheter into accessory left hepatic artery.  Evidence of severe vasospasm in the accessory left hepatic artery and difficult to exclude a small amount of thrombus or dissection.  Progreat catheter was eventually advanced into the accessory left hepatic artery but there was persistent stagnant flow and unable to give particles for embolization.  Embolization was not performed. Right groin closure with AngioSeal.  Complications: No immediate complications noted.     EBL: Minimal  Plan: Bedrest 4 hours.  Would like to attempt another bland embolization in 1 month, will discuss with patient.    Zamari Vea R. Anselm Pancoast, MD  Pager: [phone]

## 2022-06-19 NOTE — Discharge Instructions (Signed)
Please call Interventional Radiology clinic [phone] with any questions or concerns.  You may remove your dressing and shower tomorrow.  Moderate Conscious Sedation, Adult, Care After This sheet gives you information about how to care for yourself after your procedure. Your health care provider may also give you more specific instructions. If you have problems or questions, contact your health careprovider. What can I expect after the procedure? After the procedure, it is common to have: Sleepiness for several hours. Impaired judgment for several hours. Difficulty with balance. Vomiting if you eat too soon. Follow these instructions at home: For the time period you were told by your health care provider: Rest. Do not participate in activities where you could fall or become injured. Do not drive or use machinery. Do not drink alcohol. Do not take sleeping pills or medicines that cause drowsiness. Do not make important decisions or sign legal documents. Do not take care of children on your own. Eating and drinking  Follow the diet recommended by your health care provider. Drink enough fluid to keep your urine pale yellow. If you vomit: Drink water, juice, or soup when you can drink without vomiting. Make sure you have little or no nausea before eating solid foods.  General instructions Take over-the-counter and prescription medicines only as told by your health care provider. Have a responsible adult stay with you for the time you are told. It is important to have someone help care for you until you are awake and alert. Do not smoke. Keep all follow-up visits as told by your health care provider. This is important. Contact a health care provider if: You are still sleepy or having trouble with balance after 24 hours. You feel light-headed. You keep feeling nauseous or you keep vomiting. You develop a rash. You have a fever. You have redness or swelling around the IV  site. Get help right away if: You have trouble breathing. You have new-onset confusion at home. Summary After the procedure, it is common to feel sleepy, have impaired judgment, or feel nauseous if you eat too soon. Rest after you get home. Know the things you should not do after the procedure. Follow the diet recommended by your health care provider and drink enough fluid to keep your urine pale yellow. Get help right away if you have trouble breathing or new-onset confusion at home. This information is not intended to replace advice given to you by your health care provider. Make sure you discuss any questions you have with your healthcare provider. Document Revised: 12/08/2019 Document Reviewed: 07/06/2019 Elsevier Patient Education  2022 [employer]Femoral Site Care This sheet gives you information about how to care for yourself after your procedure. Your health care provider may also give you more specific instructions. If you have problems or questions, contact your health care provider. What can I expect after the procedure? After the procedure, it is common to have: Bruising that usually fades within 1-2 weeks. Tenderness at the site. Follow these instructions at home: Wound care Follow instructions from your health care provider about how to take care of your insertion site. Make sure you: Wash your hands with soap and water before you change your bandage (dressing). If soap and water are not available, use hand sanitizer. Change your dressing as told by your health care provider. Leave stitches (sutures), skin glue, or adhesive strips in place. These skin closures may need to stay in place for 2 weeks or longer. If adhesive strip edges start to loosen and   curl up, you may trim the loose edges. Do not remove adhesive strips completely unless your health care provider tells you to do that. Do not take baths, swim, or use a hot tub until your health care provider approves. You may  shower 24-48 hours after the procedure or as told by your health care provider. Gently wash the site with plain soap and water. Pat the area dry with a clean towel. Do not rub the site. This may cause bleeding. Do not apply powder or lotion to the site. Keep the site clean and dry. Check your femoral site every day for signs of infection. Check for: Redness, swelling, or pain. Fluid or blood. Warmth. Pus or a bad smell. Activity For the first 2-3 days after your procedure, or as long as directed: Avoid climbing stairs as much as possible. Do not squat. Do not lift anything that is heavier than 10 lb (4.5 kg), or the limit that you are told, until your health care provider says that it is safe. Rest as directed. Avoid sitting for a long time without moving. Get up to take short walks every 1-2 hours. Do not drive for 24 hours if you were given a medicine to help you relax (sedative). General instructions Take over-the-counter and prescription medicines only as told by your health care provider. Keep all follow-up visits as told by your health care provider. This is important. Contact a health care provider if you have: A fever or chills. You have redness, swelling, or pain around your insertion site. Get help right away if: The catheter insertion area swells very fast. You pass out. You suddenly start to sweat or your skin gets clammy. The catheter insertion area is bleeding, and the bleeding does not stop when you hold steady pressure on the area. The area near or just beyond the catheter insertion site becomes pale, cool, tingly, or numb. These symptoms may represent a serious problem that is an emergency. Do not wait to see if the symptoms will go away. Get medical help right away. Call your local emergency services (911 in the [place] Do not drive yourself to the hospital. Summary After the procedure, it is common to have bruising that usually fades within 1-2 weeks. Check your  femoral site every day for signs of infection. Do not lift anything that is heavier than 10 lb (4.5 kg), or the limit that you are told, until your health care provider says that it is safe. This information is not intended to replace advice given to you by your health care provider. Make sure you discuss any questions you have with your health care provider. Document Revised: 08/23/2017 Document Reviewed: 08/23/2017 Elsevier Patient Education  2020 [employer] 

## 2022-06-22 ENCOUNTER — Other Ambulatory Visit: Payer: Medicare HMO

## 2022-06-22 ENCOUNTER — Ambulatory Visit: Payer: Medicare HMO | Admitting: Hematology

## 2022-06-22 ENCOUNTER — Other Ambulatory Visit (HOSPITAL_COMMUNITY): Payer: Self-pay | Admitting: Diagnostic Radiology

## 2022-06-22 DIAGNOSIS — C220 Liver cell carcinoma: Secondary | ICD-10-CM

## 2022-06-29 DIAGNOSIS — N184 Chronic kidney disease, stage 4 (severe): Secondary | ICD-10-CM | POA: Diagnosis not present

## 2022-07-01 ENCOUNTER — Other Ambulatory Visit: Payer: Self-pay | Admitting: Hematology

## 2022-07-01 DIAGNOSIS — C220 Liver cell carcinoma: Secondary | ICD-10-CM

## 2022-07-03 ENCOUNTER — Inpatient Hospital Stay (HOSPITAL_BASED_OUTPATIENT_CLINIC_OR_DEPARTMENT_OTHER): Payer: Medicare HMO | Admitting: Hematology

## 2022-07-03 ENCOUNTER — Inpatient Hospital Stay: Payer: Medicare HMO | Attending: Hematology

## 2022-07-03 ENCOUNTER — Other Ambulatory Visit: Payer: Self-pay

## 2022-07-03 VITALS — BP 137/81 | HR 75 | Temp 97.7°F | Resp 16 | Wt 217.3 lb

## 2022-07-03 DIAGNOSIS — D472 Monoclonal gammopathy: Secondary | ICD-10-CM | POA: Diagnosis not present

## 2022-07-03 DIAGNOSIS — Z8505 Personal history of malignant neoplasm of liver: Secondary | ICD-10-CM | POA: Insufficient documentation

## 2022-07-03 DIAGNOSIS — C220 Liver cell carcinoma: Secondary | ICD-10-CM | POA: Diagnosis not present

## 2022-07-03 DIAGNOSIS — Z08 Encounter for follow-up examination after completed treatment for malignant neoplasm: Secondary | ICD-10-CM | POA: Diagnosis not present

## 2022-07-03 DIAGNOSIS — B1920 Unspecified viral hepatitis C without hepatic coma: Secondary | ICD-10-CM | POA: Diagnosis not present

## 2022-07-03 LAB — CMP (CANCER CENTER ONLY)
ALT: 27 U/L (ref 0–44)
AST: 33 U/L (ref 15–41)
Albumin: 3.7 g/dL (ref 3.5–5.0)
Alkaline Phosphatase: 143 U/L — ABNORMAL HIGH (ref 38–126)
Anion gap: 8 (ref 5–15)
BUN: 35 mg/dL — ABNORMAL HIGH (ref 8–23)
CO2: 28 mmol/L (ref 22–32)
Calcium: 9.5 mg/dL (ref 8.9–10.3)
Chloride: 104 mmol/L (ref 98–111)
Creatinine: 2.4 mg/dL — ABNORMAL HIGH (ref 0.61–1.24)
GFR, Estimated: 27 mL/min — ABNORMAL LOW (ref >60)
Glucose, Bld: 107 mg/dL — ABNORMAL HIGH (ref 70–99)
Potassium: 4.8 mmol/L (ref 3.5–5.1)
Sodium: 140 mmol/L (ref 135–145)
Total Bilirubin: 0.6 mg/dL (ref 0.3–1.2)
Total Protein: 8.6 g/dL — ABNORMAL HIGH (ref 6.5–8.1)

## 2022-07-03 LAB — CBC WITH DIFFERENTIAL (CANCER CENTER ONLY)
Abs Immature Granulocytes: 0.02 10*3/uL (ref 0.00–0.07)
Basophils Absolute: 0 10*3/uL (ref 0.0–0.1)
Basophils Relative: 0 %
Eosinophils Absolute: 0.1 10*3/uL (ref 0.0–0.5)
Eosinophils Relative: 2 %
HCT: 41.3 % (ref 39.0–52.0)
Hemoglobin: 12.8 g/dL — ABNORMAL LOW (ref 13.0–17.0)
Immature Granulocytes: 0 %
Lymphocytes Relative: 18 %
Lymphs Abs: 1.2 10*3/uL (ref 0.7–4.0)
MCH: 23.6 pg — ABNORMAL LOW (ref 26.0–34.0)
MCHC: 31 g/dL (ref 30.0–36.0)
MCV: 76.2 fL — ABNORMAL LOW (ref 80.0–100.0)
Monocytes Absolute: 0.5 10*3/uL (ref 0.1–1.0)
Monocytes Relative: 8 %
Neutro Abs: 4.7 10*3/uL (ref 1.7–7.7)
Neutrophils Relative %: 72 %
Platelet Count: 279 10*3/uL (ref 150–400)
RBC: 5.42 MIL/uL (ref 4.22–5.81)
RDW: 14.7 % (ref 11.5–15.5)
WBC Count: 6.6 10*3/uL (ref 4.0–10.5)
nRBC: 0 % (ref 0.0–0.2)

## 2022-07-03 LAB — MAGNESIUM: Magnesium: 2.1 mg/dL (ref 1.7–2.4)

## 2022-07-04 LAB — AFP TUMOR MARKER: AFP, Serum, Tumor Marker: 940 ng/mL — ABNORMAL HIGH (ref 0.0–8.4)

## 2022-07-06 DIAGNOSIS — D472 Monoclonal gammopathy: Secondary | ICD-10-CM | POA: Diagnosis not present

## 2022-07-06 DIAGNOSIS — E1122 Type 2 diabetes mellitus with diabetic chronic kidney disease: Secondary | ICD-10-CM | POA: Diagnosis not present

## 2022-07-06 DIAGNOSIS — N184 Chronic kidney disease, stage 4 (severe): Secondary | ICD-10-CM | POA: Diagnosis not present

## 2022-07-06 DIAGNOSIS — R809 Proteinuria, unspecified: Secondary | ICD-10-CM | POA: Diagnosis not present

## 2022-07-06 DIAGNOSIS — E1129 Type 2 diabetes mellitus with other diabetic kidney complication: Secondary | ICD-10-CM | POA: Diagnosis not present

## 2022-07-06 DIAGNOSIS — C220 Liver cell carcinoma: Secondary | ICD-10-CM | POA: Diagnosis not present

## 2022-07-06 DIAGNOSIS — I129 Hypertensive chronic kidney disease with stage 1 through stage 4 chronic kidney disease, or unspecified chronic kidney disease: Secondary | ICD-10-CM | POA: Diagnosis not present

## 2022-07-08 ENCOUNTER — Other Ambulatory Visit (HOSPITAL_COMMUNITY): Payer: Self-pay

## 2022-07-09 NOTE — Progress Notes (Signed)
HEMATOLOGY/ONCOLOGY CLINIC NOTE  Date of Service: .07/03/2022  Patient Care Team: [name], DO as PCP - General (Family Medicine)  CHIEF COMPLAINTS/PURPOSE OF CONSULTATION:  Follow-up for continued evaluation and management of hepatocellular carcinoma.  HISTORY OF PRESENTING ILLNESS:  Please see previous note for details on initial presentation  INTERVAL HISTORY:  Aaron [name] is a 76 y.o. male here for follow-up for [facility].  He reports that he has been doing well and has no acute new symptoms.  No new abdominal pain or distention.  No change in bowel habits.  No fevers no chills no night sweats. As per our chart he has lost about 7 pounds in the last 5 to 6 months. He was to have his bland embolization of the accessory left hepatic artery but this was not performed due to significant vasospasm and poor flow in the accessory left hepatic artery.  This has been rescheduled for 07/22/2022. He continues to be on lenvatinib as second line treatment and is tolerating this well without any significant toxicities.  No mucositis.  No significant diarrhea. Blood pressures well controlled. Labs done today were discussed in detail with the patient.  MEDICAL HISTORY:  Past Medical History:  Diagnosis Date   Arthritis    Diabetes mellitus without complication ([place])    type II    Dyspnea    Elevated liver enzymes    Fatty liver    Hepatitis C    genotype 1A status post treatment with Linzie Collin and Ribavarin for 24 weeks   Hepatocellular carcinoma ([place]) 01/30/2014   Path   History of colon polyps    Hyperlipidemia    Hypertension    Iron deficiency anemia    hx of    MGUS (monoclonal gammopathy of unknown significance)    Obesity Hepatocellular carcinoma treated with TACE and percutaneous thermal ablation by interventional radiology. Last MRI on 08/06/2015 showed slight decrease in the size of the ablation defect involving the right lobe of the liver area and no findings to  suggest residual or recurrent hepatocellular carcinoma. Mild changes of liver cirrhosis. MRI Abd 06/24/2016- no evidence of recurrent HCC   Hepatitis C genotype 1A status post treatment with Viekira and Ribavarin for 24 weeks.  Monoclonal gammopathy of undetermined significance.  SURGICAL HISTORY:  Status post microwave ablation[October 2015] of liver lesion and TACE [July 2015] for [place]. EGD and colonoscopy in 2016   SOCIAL HISTORY: Social History   Socioeconomic History   Marital status: Married    Spouse name: Not on file   Number of children: Not on file   Years of education: Not on file   Highest education level: Not on file  Occupational History   Not on file  Tobacco Use   Smoking status: Former    Types: Cigarettes    Quit date: 01/30/1994    Years since quitting: 28.2   Smokeless tobacco: Never   Tobacco comments:    stopped 25 yrs ago  Vaping Use   Vaping Use: Never used  Substance and Sexual Activity   Alcohol use: No    Comment: stopped 30 years ago    Drug use: No   Sexual activity: Not Currently  Other Topics Concern   Not on file  Social History Narrative   Not on file   Social Determinants of Health   Financial Resource Strain: Not on file  Food Insecurity: Not on file  Transportation Needs: Not on file  Physical Activity: Not on file  Stress:  Not on file  Social Connections: Not on file  Intimate Partner Violence: Not on file  Former smoker and smoked 1 pack per day for about 20 years starting at age 46 quit 58 years ago.  FAMILY HISTORY: No family history on file.  ALLERGIES:  has No Known Allergies.  MEDICATIONS:  Current Outpatient Medications  Medication Sig Dispense Refill   amLODipine (NORVASC) 10 MG tablet Take 1 tablet (10 mg total) by mouth daily. 30 tablet 5   aspirin EC 81 MG tablet Take 81 mg by mouth every morning.      cholecalciferol (VITAMIN D) 1000 UNITS tablet Take 1,000 Units by mouth every morning.      glipiZIDE  (GLUCOTROL XL) 2.5 MG 24 hr tablet Take 2.5 mg by mouth daily with breakfast.     Insulin Glargine (LANTUS SOLOSTAR) 100 UNIT/ML Solostar Pen Inject 32 Units into the skin daily.     lenvatinib 12 mg daily dose (LENVIMA, 12 MG DAILY DOSE,) 3 x 4 MG capsule TAKE 3 CAPSULES (12MG) BY MOUTH DAILY. 90 each 2   Metoprolol Succinate 50 MG CS24 1 capsule     Multiple Vitamin (MULTIVITAMIN WITH MINERALS) TABS tablet Take 1 tablet daily by mouth.     simvastatin (ZOCOR) 20 MG tablet Take 20 mg by mouth daily.     ACCU-CHEK AVIVA PLUS test strip      Accu-Chek Softclix Lancets lancets      PAZEO 0.7 % SOLN Place 1 drop into both eyes every morning.  3   Current Facility-Administered Medications  Medication Dose Route Frequency Provider Last Rate Last Admin   oxyCODONE (Oxy IR/ROXICODONE) immediate release tablet 5 mg  5 mg Oral Q6H PRN Jacqualine Mau, NP       Facility-Administered Medications Ordered in Other Visits  Medication Dose Route Frequency Provider Last Rate Last Admin   dexamethasone (DECADRON) injection 8 mg  8 mg Intravenous Once Narda Rutherford T, NP       ondansetron [facility]) 8 mg in sodium chloride 0.9 % 50 mL IVPB  8 mg Intravenous Once Narda Rutherford T, NP       pantoprazole (PROTONIX) injection 40 mg  40 mg Intravenous Once Tyson Alias, NP        REVIEW OF SYSTEMS:   10 Point review of Systems was done is negative except as noted above.  PHYSICAL EXAMINATION: Vitals:   04/14/22 1249  BP: (!) 140/74  Pulse: 68  Resp: 17  Temp: 97.8 F (36.6 C)  SpO2: 98%  NAD GENERAL:alert, in no acute distress and comfortable SKIN: no acute rashes, no significant lesions EYES: conjunctiva are pink and non-injected, sclera anicteric OROPHARYNX: MMM, no exudates, no oropharyngeal erythema or ulceration NECK: supple, no JVD LYMPH:  no palpable lymphadenopathy in the cervical, axillary or inguinal regions LUNGS: clear to auscultation b/l with normal respiratory effort HEART: regular  rate & rhythm ABDOMEN:  normoactive bowel sounds , non tender, not distended. Extremity: no pedal edema PSYCH: alert & oriented x 3 with fluent speech NEURO: no focal motor/sensory deficits   LABORATORY DATA:  I have reviewed the data as listed     Latest Ref Rng & Units 07/03/2022    8:13 AM 06/19/2022    8:00 AM 04/14/2022   12:23 PM  CBC  WBC 4.0 - 10.5 K/uL 6.6  5.7  5.0   Hemoglobin 13.0 - 17.0 g/dL 12.8  11.7  12.1   Hematocrit 39.0 - 52.0 % 41.3  38.8  38.3   Platelets 150 - 400 K/uL 279  159  160     CBC    Component Value Date/Time   WBC 6.6 07/03/2022 0813   WBC 5.7 06/19/2022 0800   RBC 5.42 07/03/2022 0813   HGB 12.8 (L) 07/03/2022 0813   HGB 11.5 (L) 03/22/2017 1402   HCT 41.3 07/03/2022 0813   HCT 37.0 (L) 03/22/2017 1402   PLT 279 07/03/2022 0813   PLT 262 03/22/2017 1402   MCV 76.2 (L) 07/03/2022 0813   MCV 72.3 (L) 03/22/2017 1402   MCH 23.6 (L) 07/03/2022 0813   MCHC 31.0 07/03/2022 0813   RDW 14.7 07/03/2022 0813   RDW 15.6 (H) 03/22/2017 1402   LYMPHSABS 1.2 07/03/2022 0813   LYMPHSABS 1.8 03/22/2017 1402   MONOABS 0.5 07/03/2022 0813   MONOABS 0.4 03/22/2017 1402   EOSABS 0.1 07/03/2022 0813   EOSABS 0.1 03/22/2017 1402   BASOSABS 0.0 07/03/2022 0813   BASOSABS 0.0 03/22/2017 1402    .    Latest Ref Rng & Units 07/03/2022    8:13 AM 06/19/2022    8:00 AM 04/14/2022   12:23 PM  CMP  Glucose 70 - 99 mg/dL 107  112  97   BUN 8 - 23 mg/dL 35  32  40   Creatinine 0.61 - 1.24 mg/dL 2.40  2.04  2.59   Sodium 135 - 145 mmol/L 140  139  138   Potassium 3.5 - 5.1 mmol/L 4.8  4.7  4.2   Chloride 98 - 111 mmol/L 104  108  109   CO2 22 - 32 mmol/L _0 Calcium 8.9 - 10.3 mg/dL 9.5  8.8  9.3   Total Protein 6.5 - 8.1 g/dL 8.6  7.5  7.7   Total Bilirubin 0.3 - 1.2 mg/dL 0.6  0.9  0.5   Alkaline Phos 38 - 126 U/L 143  78  96   AST 15 - 41 U/L 33  43  34   ALT 0 - 44 U/L _1 06/21/2019 MR ABDOMEN WWO CONTRAST (Accession  [id number])     IFE 1  Comment    Comments: Immunofixation shows IgG monoclonal protein with kappa light chain  specificity.           RADIOGRAPHIC STUDIES:  MRI abd w and wo contrast: 12/17/2016: IMPRESSION: 1. Postprocedural changes of thermal ablation again noted in the right lobe of the liver, without definitive evidence to suggest local recurrence of disease. No new hepatic lesions are noted. 2. Additional incidental findings, as above.     Electronically Signed   By: Vinnie Langton M.D.   On: 12/17/2016 09:42  MRI abd w and wo contrast 01/26/2020 IMPRESSION: 1. Interval enlargement of multiple hepatic masses as detailed above, consistent with progression of multifocal hepatocellular carcinoma. There is evidence of prior ablation in hepatic segments VII and VIII. 2. No evidence of metastatic disease in the abdomen.   Electronically Signed   By: Eddie Candle M.D.   On: 01/27/2020 15:51  MRI abd and wo contrast 10/04/2021 FINDINGS: Lower chest: Unremarkable   Hepatobiliary: Multiple liver lesions are present, some of these represent previously treated tumors and some represent active enhancing malignancy.   An index solid enhancing lesion in the lateral segment left hepatic lobe measures 3.4 by 2.8 cm on image 15 series 2, formerly 3.5 by 2.7 cm, essentially stable. Just below this a  T2 hyperintense lesion measuring 1.5 by 1.1 cm on image 18 of series 2 previously measured 1.4 by 1.2 cm, likewise stable.   A currently enhancing lesion in the right hepatic lobe adjacent to the IVC measures 1.8 by 1.6 cm on image 13 series 2, stable.   A mostly centrally necrotic a 3.6 by 3.1 cm mostly centrally necrotic lesion in segment 4a of the liver on image 29 of series 21 previously measured 4.3 by 3.0 cm. This lesion is mostly centrally necrotic today although with some nodular enhancement along its margin as on image 29 series 21.   Previously treated right  hepatic lobe lesions are observed, and mostly nonenhancing or poorly enhancing.   A somewhat indistinctly marginated lesion posteriorly in segment 4a of the liver is diffusely enhancing and measures about 2.9 by 2.8 cm on image 28 of series 21. A previous faintly T2 hyperintense lesion in this vicinity on 02/20/2021 measured 2.0 by 1.6 cm, and accordingly this represents enlargement of this enhancing mass.   An enhancing mass laterally in segment 3 of the liver measures 3.0 by 2.6 cm on image 31 series 8, formerly 2.9 by 2.6 cm, essentially stable.   No biliary dilatation is observed.   Pancreas:  Unremarkable   Spleen:  Unremarkable   Adrenals/Urinary Tract:  Unremarkable   Stomach/Bowel: Unremarkable   Vascular/Lymphatic: Atherosclerosis is present, including aortoiliac atherosclerotic disease.   Other:  No supplemental non-categorized findings.   Musculoskeletal: Unchanged mild lower lumbar spondylosis and degenerative disc disease.   IMPRESSION: 1. Mixed appearance, with some of the enhancing liver lesions stable; some improved such as the centrally necrotic anterior segment 4A lesion which demonstrates substantial central necrosis compared to previous; but with at least 1 worsened lesion which is the posterior segment 4a lesion (image 29, series 20), moderately increased in size. 2. Aortic Atherosclerosis (ICD10-I70.0). 3. Mild lower lumbar spondylosis and degenerative disc disease.  Electronically Signed   By: Van Clines M.D.   On: 10/06/2021 11:08  ASSESSMENT & PLAN:   76 y.o. male with  #1 Monoclonal gammopathy of undetermined significance.  M protein 0.2 mg/dL immunofixation showing IgG monoclonal protein with kappa light chain specificity. SPEP 02/2016 - 0.3g/dl  No overt anemia.  No overt hypercalcemia. Stable CKD creatinine 1.6  #2 bone lucencies in the right radial mid midshaft and left humeral head. PET/CT did not show any hypermetabolic  bone lesions. MRI left shoulder showed no evidence of metastatic disease or multiple myeloma. The x-ray findings appear to be areas of mild demineralization. Patient was seen by orthopedics and no additional recommendations were given.  Plan -Patient's anemia is stable. No significant change in renal function. No new bone pains. No hypercalcemia . No overall no overt evidence of progression of multiple myeloma. -Myeloma labs from today show stable M protein @ 0.2g/dl with no evidence of progression- consistent with MGUS.  #3 Hepatocellular carcinoma treated with TACE and percutaneous thermal ablation , Y 90 and recent bland arterial embolization on 06/06/2021 by interventional radiology.   Patient had bland embolization on 02/05/2022 for his hepatocellular carcinoma.   Last MRI on 08/06/2015 showed slight decrease in the size of the ablation defect involving the right lobe of the liver area and no findings to suggest residual or recurrent hepatocellular carcinoma. Mild changes of liver cirrhosis.  01/16/2016 MRI Abdomen showed resolution of previously ablated lesion but a new 1.3 cm lesion was noted which was subsequently ablated under CT guidance by interventional radiology on 01/31/2016. Elevated  transaminases on 02/01/2016 were likely related to his ablation. These have since resolved.   06/24/2016 MRI Abdomen showed no evidence of recurrent hepatocellular carcinoma . 12/17/2016 MRI Abdomen shows no evidence of recurrent hepatocellular carcinoma .  10/26/17 MRI Abdomen revealed Motion degraded images. Status post thermal ablation of the segment 8 lesion, without enhancement to suggest residual viable tumor. Prior ablation of a segment 7 lesion, grossly unchanged. No convincing central enhancement  03/14/2019  MRI abdomen w and wo contrast revealed "1. New areas of restricted diffusion, surrounding nodular arterial phase enhancement and delayed washout surrounding the ablation zone defects anteriorly  in segments 8 and 7 consistent with local recurrence of hepatocellular carcinoma. 2. The peripheral ablation zone defect laterally in segment 8 is unchanged. 3. No extrahepatic tumor identified."  10/02/2019 MRI Abdomen ([id number]) revealed "1. Progressive multifocal hepatocellular carcinoma, especially anteriorly around the ablated lesion in segments 4B and 8. There are additional new lesions in segments 2 and 3. No evidence of extrahepatic metastatic disease."  01/26/2020 MRI Abdomen ([id number]) revealed "1. Interval enlargement of multiple hepatic masses as detailed above, consistent with progression of multifocal hepatocellular carcinoma. There is evidence of prior ablation in hepatic segments VII and VIII. 2. No evidence of metastatic disease in the abdomen."  10/04/2021 MRI Abdomen ([id number]) revealed "1. Mixed appearance, with some of the enhancing liver lesions stable; some improved such as the centrally necrotic anterior segment 4A lesion which demonstrates substantial central necrosis compared to previous; but with at least 1 worsened lesion which is the posterior segment 4a lesion (image 29, series 20), moderately increased in size."  04/09/2022 MRI Abdomen ([id number]) revealed "1. Again noted are multiple lesions involving both lobes of liver compatible with multifocal hepatocellular carcinoma. When compared with the previous exam there no significant change in the overall tumor volume from 10/04/2021. 2. Interval treatment response with hypoenhancement of lesion within the segment 3 and the small lesion within dome of segment 2. 3. The remaining lesions continue to exhibit variable degrees of internal enhancement. 4. There is a new enhancing lesion within the dome of liver (segment 8/4a). 5. Aortic Atherosclerosis"   #4 Hepatitis C genotype 1A status post treatment with Viekira and Ribavarin for 24 weeks.   #5 Microcytosis with Minimal Anemia - Hgb electrophoresis suggestive of thal  trait given relative polycythemia.    PLAN:  -Patient has no new clinical symptoms since his last clinic visit.   - labs done today were discussed in detail with him and show stable CBC and CMP  -AFP tumor marker up from 381 to 504 to 940 today showing disease progression. -He is prescheduled for bland embolization to the dominant lesion by interventional radiology on 07/22/2022. -Depending on response to treatment we will consider getting Guardant360 and might need to switch his lenvatinib to Cabozantinib or if Guardant360 shows high tumor mutation burden might consider immunotherapy combination again or if RET oncogene positive can consider targeted therapy for that. -He will continue his lenvatinib at this time at the current dose of 12 mg p.o. daily. -He has been recommended to hold his lenvatinib for 2 weeks after his bland embolization and restarted if his repeat labs show stable liver functions. FOLLOW UP: Labs on 12/11 Phone visit with Dr Irene Limbo 08/04/2022  The total time spent in the appointment was 20 minutes*.  All of the patient's questions were answered with apparent satisfaction. The patient knows to call the clinic with any problems, questions or concerns.   [name] MD MS AAHIVMS  [facility] Hematology/Oncology Physician [place]  .*Total Encounter Time as defined by the Centers for Medicare and Medicaid Services includes, in addition to the face-to-face time of a patient visit (documented in the note above) non-face-to-face time: obtaining and reviewing outside history, ordering and reviewing medications, tests or procedures, care coordination (communications with other health care professionals or caregivers) and documentation in the medical record.

## 2022-07-11 ENCOUNTER — Other Ambulatory Visit (HOSPITAL_COMMUNITY): Payer: Self-pay

## 2022-07-13 ENCOUNTER — Other Ambulatory Visit: Payer: Self-pay | Admitting: Hematology

## 2022-07-13 MED ORDER — OXYCODONE HCL 5 MG PO TABS: 5.0000 mg | ORAL_TABLET | Freq: Four times a day (QID) | ORAL | 0 refills | Status: DC | PRN

## 2022-07-13 MED ORDER — SENNOSIDES-DOCUSATE SODIUM 8.6-50 MG PO TABS: 2.0000 | ORAL_TABLET | Freq: Every evening | ORAL | 1 refills | Status: DC | PRN

## 2022-07-20 ENCOUNTER — Other Ambulatory Visit (HOSPITAL_COMMUNITY): Payer: Self-pay

## 2022-07-21 ENCOUNTER — Other Ambulatory Visit: Payer: Self-pay | Admitting: Radiology

## 2022-07-21 DIAGNOSIS — C220 Liver cell carcinoma: Secondary | ICD-10-CM

## 2022-07-21 NOTE — H&P (Signed)
Referring Physician(s): Kale,G  Supervising Physician: Markus Daft  Patient Status:  [facility]  Chief Complaint: Multifocal hepatocellular carcinoma    Subjective: 76 year old with hx multifocal hepatocellular carcinoma.  Patient has undergone multiple liver directed therapies. He underwent bland embolization to the accessory left hepatic artery that was supplying multiple tumors in the left hepatic lobe in June of this year.  Patient is also taking kinase inhibitor managed by Dr. Irene Limbo.  Abdominal MRI from 04/09/2022 demonstrates good treatment response in the left hepatic lobe.  There is clearly decreased enhancement to many of the left hepatic lesions although there is evidence for residual tumor nodularity in some of these lesions.  In addition, there are small lesions in the central aspect of the liver that are likely receiving blood supply from both left and right hepatic arteries based on the previous cone beam CT images.  Overall, the patient's tumor vascularity is complex which makes it difficult to treat.  Patient has already received a large Y90 dose to the central aspect of the left hepatic artery distribution and I would like to avoid Y90 radioembolization in this area.  Will try to manage the liver disease with catheter directed bland embolization/chemoembolization if possible but the right hepatic lobe could receive another dose of Y90 radioembolization if needed. Most recent AFP is [phone](504). On 06/19/22 pt underwent : 1. Visceral arteriography with catheterization of the left gastric artery and accessory left hepatic artery. 2. Bland embolization of the accessory left hepatic artery was not performed due to significant vasospasm and poor flow in the accessory left hepatic artery. Bland embolization will be re-attempted at another time  He presents again today for repeat attempt at bland [place] embolization. He currently denies fever,HA,CP,dyspnea, cough, abd/back pain, N/V or  bleeding. He does have so left hip/LE pain. Additional med hx as below.   Past Medical History:  Diagnosis Date   Arthritis    Diabetes mellitus without complication ([place])    type II    Dyspnea    Elevated liver enzymes    Fatty liver    Hepatitis C    genotype 1A status post treatment with Linzie Collin and Ribavarin for 24 weeks   Hepatocellular carcinoma ([place]) 01/30/2014   Path   History of colon polyps    Hyperlipidemia    Hypertension    Iron deficiency anemia    hx of    MGUS (monoclonal gammopathy of unknown significance)    Past Surgical History:  Procedure Laterality Date   COLONOSCOPY     ESOPHAGOGASTRODUODENOSCOPY  2016   IR 3D INDEPENDENT WKST  01/23/2022   IR ANGIOGRAM SELECTIVE EACH ADDITIONAL VESSEL  04/04/2019   IR ANGIOGRAM SELECTIVE EACH ADDITIONAL VESSEL  04/04/2019   IR ANGIOGRAM SELECTIVE EACH ADDITIONAL VESSEL  04/04/2019   IR ANGIOGRAM SELECTIVE EACH ADDITIONAL VESSEL  04/04/2019   IR ANGIOGRAM SELECTIVE EACH ADDITIONAL VESSEL  04/04/2019   IR ANGIOGRAM SELECTIVE EACH ADDITIONAL VESSEL  04/04/2019   IR ANGIOGRAM SELECTIVE EACH ADDITIONAL VESSEL  04/04/2019   IR ANGIOGRAM SELECTIVE EACH ADDITIONAL VESSEL  05/16/2019   IR ANGIOGRAM SELECTIVE EACH ADDITIONAL VESSEL  05/16/2019   IR ANGIOGRAM SELECTIVE EACH ADDITIONAL VESSEL  05/16/2019   IR ANGIOGRAM SELECTIVE EACH ADDITIONAL VESSEL  02/14/2020   IR ANGIOGRAM SELECTIVE EACH ADDITIONAL VESSEL  02/14/2020   IR ANGIOGRAM SELECTIVE EACH ADDITIONAL VESSEL  02/14/2020   IR ANGIOGRAM SELECTIVE EACH ADDITIONAL VESSEL  02/14/2020   IR ANGIOGRAM SELECTIVE EACH ADDITIONAL VESSEL  03/01/2020   IR  ANGIOGRAM SELECTIVE EACH ADDITIONAL VESSEL  03/01/2020   IR ANGIOGRAM SELECTIVE EACH ADDITIONAL VESSEL  03/01/2020   IR ANGIOGRAM SELECTIVE EACH ADDITIONAL VESSEL  03/01/2020   IR ANGIOGRAM SELECTIVE EACH ADDITIONAL VESSEL  03/01/2020   IR ANGIOGRAM SELECTIVE EACH ADDITIONAL VESSEL  06/18/2020   IR ANGIOGRAM SELECTIVE EACH ADDITIONAL VESSEL  06/18/2020    IR ANGIOGRAM SELECTIVE EACH ADDITIONAL VESSEL  11/19/2020   IR ANGIOGRAM SELECTIVE EACH ADDITIONAL VESSEL  11/19/2020   IR ANGIOGRAM SELECTIVE EACH ADDITIONAL VESSEL  06/06/2021   IR ANGIOGRAM SELECTIVE EACH ADDITIONAL VESSEL  06/06/2021   IR ANGIOGRAM SELECTIVE EACH ADDITIONAL VESSEL  06/06/2021   IR ANGIOGRAM SELECTIVE EACH ADDITIONAL VESSEL  06/06/2021   IR ANGIOGRAM SELECTIVE EACH ADDITIONAL VESSEL  01/23/2022   IR ANGIOGRAM SELECTIVE EACH ADDITIONAL VESSEL  01/23/2022   IR ANGIOGRAM SELECTIVE EACH ADDITIONAL VESSEL  02/05/2022   IR ANGIOGRAM SELECTIVE EACH ADDITIONAL VESSEL  02/05/2022   IR ANGIOGRAM SELECTIVE EACH ADDITIONAL VESSEL  06/19/2022   IR ANGIOGRAM SELECTIVE EACH ADDITIONAL VESSEL  06/19/2022   IR ANGIOGRAM VISCERAL SELECTIVE  04/04/2019   IR ANGIOGRAM VISCERAL SELECTIVE  05/16/2019   IR ANGIOGRAM VISCERAL SELECTIVE  02/14/2020   IR ANGIOGRAM VISCERAL SELECTIVE  02/14/2020   IR ANGIOGRAM VISCERAL SELECTIVE  03/01/2020   IR ANGIOGRAM VISCERAL SELECTIVE  06/18/2020   IR ANGIOGRAM VISCERAL SELECTIVE  11/19/2020   IR ANGIOGRAM VISCERAL SELECTIVE  01/23/2022   IR ANGIOGRAM VISCERAL SELECTIVE  02/05/2022   IR ANGIOGRAM VISCERAL SELECTIVE  06/19/2022   IR EMBO ARTERIAL NOT HEMORR HEMANG INC GUIDE ROADMAPPING  02/14/2020   IR EMBO TUMOR ORGAN ISCHEMIA INFARCT INC GUIDE ROADMAPPING  04/04/2019   IR EMBO TUMOR ORGAN ISCHEMIA INFARCT INC GUIDE ROADMAPPING  05/16/2019   IR EMBO TUMOR ORGAN ISCHEMIA INFARCT INC GUIDE ROADMAPPING  03/01/2020   IR EMBO TUMOR ORGAN ISCHEMIA INFARCT INC GUIDE ROADMAPPING  06/18/2020   IR EMBO TUMOR ORGAN ISCHEMIA INFARCT INC GUIDE ROADMAPPING  11/19/2020   IR EMBO TUMOR ORGAN ISCHEMIA INFARCT INC GUIDE ROADMAPPING  06/06/2021   IR EMBO TUMOR ORGAN ISCHEMIA INFARCT INC GUIDE ROADMAPPING  02/05/2022   IR GENERIC HISTORICAL  02/26/2016   IR RADIOLOGIST EVAL & MGMT 02/26/2016 Markus Daft, MD [facility] INTERV RAD   IR GENERIC HISTORICAL  01/16/2016   IR RADIOLOGIST EVAL & MGMT 01/16/2016  Markus Daft, MD [facility]   IR GENERIC HISTORICAL  06/24/2016   IR RADIOLOGIST EVAL & MGMT 06/24/2016 Aletta Edouard, MD [facility]   IR RADIOLOGIST EVAL & MGMT  12/17/2016   IR RADIOLOGIST EVAL & MGMT  06/16/2017   IR RADIOLOGIST EVAL & MGMT  09/08/2017   IR RADIOLOGIST EVAL & MGMT  10/26/2017   IR RADIOLOGIST EVAL & MGMT  05/12/2018   IR RADIOLOGIST EVAL & MGMT  07/26/2018   IR RADIOLOGIST EVAL & MGMT  03/22/2019   IR RADIOLOGIST EVAL & MGMT  04/11/2019   IR RADIOLOGIST EVAL & MGMT  06/28/2019   IR RADIOLOGIST EVAL & MGMT  10/10/2019   IR RADIOLOGIST EVAL & MGMT  05/21/2020   IR RADIOLOGIST EVAL & MGMT  06/12/2020   IR RADIOLOGIST EVAL & MGMT  10/24/2020   IR RADIOLOGIST EVAL & MGMT  12/11/2020   IR RADIOLOGIST EVAL & MGMT  02/27/2021   IR RADIOLOGIST EVAL & MGMT  11/25/2021   IR RADIOLOGIST EVAL & MGMT  03/20/2022   IR RADIOLOGIST EVAL & MGMT  04/14/2022   IR US GUIDE VASC ACCESS LEFT  05/16/2019  IR US GUIDE VASC ACCESS LEFT  02/05/2022   IR US GUIDE VASC ACCESS RIGHT  04/04/2019   IR US GUIDE VASC ACCESS RIGHT  02/14/2020   IR US GUIDE VASC ACCESS RIGHT  03/01/2020   IR US GUIDE VASC ACCESS RIGHT  06/18/2020   IR US GUIDE VASC ACCESS RIGHT  11/19/2020   IR US GUIDE VASC ACCESS RIGHT  06/06/2021   IR US GUIDE VASC ACCESS RIGHT  01/23/2022   IR US GUIDE VASC ACCESS RIGHT  06/19/2022   POLYPECTOMY     RADIOFREQUENCY ABLATION N/A 07/21/2017   Procedure: CT MICROWAVE THERMAL ABLATION-LIVER;  Surgeon: Markus Daft, MD;  Location: [facility];  Service: Anesthesiology;  Laterality: N/A;   RADIOFREQUENCY ABLATION N/A 06/29/2018   Procedure: CT MICROWAVE THERMAL ABLATION;  Surgeon: Markus Daft, MD;  Location: [facility];  Service: Anesthesiology;  Laterality: N/A;     Allergies: Patient has no known allergies.  Medications: Prior to Admission medications   Medication Sig Start Date End Date Taking? Authorizing Provider  ACCU-CHEK AVIVA PLUS test strip  11/19/19   [provider]  Accu-Chek  Softclix Lancets lancets  09/12/19   [provider]  amLODipine (NORVASC) 10 MG tablet TAKE 1 TABLET BY MOUTH EVERY DAY 07/01/22   Brunetta Genera, MD  aspirin EC 81 MG tablet Take 81 mg by mouth every morning.     [provider]  cholecalciferol (VITAMIN D) 1000 UNITS tablet Take 1,000 Units by mouth every morning.     [provider]  glipiZIDE (GLUCOTROL XL) 2.5 MG 24 hr tablet Take 2.5 mg by mouth daily with breakfast.    [provider]  Insulin Glargine (LANTUS SOLOSTAR) 100 UNIT/ML Solostar Pen Inject 32 Units into the skin daily.    [provider]  lenvatinib 12 mg daily dose (LENVIMA) 3 x 4 MG capsule TAKE 3 CAPSULES ('12MG'$ ) BY MOUTH DAILY. 06/04/22 06/04/23  Brunetta Genera, MD  Metoprolol Succinate 50 MG CS24 1 capsule    [provider]  Multiple Vitamin (MULTIVITAMIN WITH MINERALS) TABS tablet Take 1 tablet daily by mouth.    [provider]  oxyCODONE (OXY IR/ROXICODONE) 5 MG immediate release tablet Take 1 tablet (5 mg total) by mouth every 6 (six) hours as needed for severe pain. 07/13/22   Brunetta Genera, MD  PAZEO 0.7 % SOLN Place 1 drop into both eyes every morning. 05/09/18   [provider]  senna-docusate (SENNA S) 8.6-50 MG tablet Take 2 tablets by mouth at bedtime as needed for mild constipation or moderate constipation. 07/13/22   Brunetta Genera, MD  simvastatin (ZOCOR) 20 MG tablet Take 20 mg by mouth daily.    [provider]     Vital Signs: Vitals:   07/22/22 0821  BP: 124/76  Pulse: 73  Resp: 18  Temp: 98 F (36.7 C)  SpO2: 99%      Physical Exam awake/alert; chest- CTA bilat; heart- RRR; abd- soft,+BS,NT; no sig LE edema  Imaging: No results found.  Labs:  CBC: Recent Labs    02/09/22 1228 04/14/22 1223 06/19/22 0800 07/03/22 0813  WBC 8.4 5.0 5.7 6.6  HGB 11.0* 12.1* 11.7* 12.8*  HCT 34.8* 38.3* 38.8* 41.3  PLT 208 160 159 279     COAGS: Recent Labs    01/23/22 0808 02/05/22 0811 06/19/22 0800  INR 1.0 1.0 1.0    BMP: Recent Labs    02/09/22 1228 04/14/22 1223 06/19/22 0800 07/03/22 0813  NA 135 138 139 140  K 4.0 4.2 4.7 4.8  CL 102 109 108 104  CO2 '25 24 23 28  '$ GLUCOSE 195* 97 112* 107*  BUN 37* 40* 32* 35*  CALCIUM 9.4 9.3 8.8* 9.5  CREATININE 2.13* 2.59* 2.04* 2.40*  GFRNONAA 32* 25* 33* 27*    LIVER FUNCTION TESTS: Recent Labs    02/09/22 1228 04/14/22 1223 06/19/22 0800 07/03/22 0813  BILITOT 0.8 0.5 0.9 0.6  AST 77* 34 43* 33  ALT 64* '26 24 27  '$ ALKPHOS 152* 96 78 143*  PROT 7.7 7.7 7.5 8.6*  ALBUMIN 3.6 4.1 3.6 3.7    Assessment and Plan: 76 year old with hx arthritis, DM, hepatitis C, HLD,HTN, anemia, MGUS and multifocal hepatocellular carcinoma.  Patient has undergone multiple liver directed therapies. He underwent bland embolization to the accessory left hepatic artery that was supplying multiple tumors in the left hepatic lobe in June of this year.  Patient is also taking kinase inhibitor managed by Dr. Irene Limbo.  Abdominal MRI from 04/09/2022 demonstrates good treatment response in the left hepatic lobe.  There is clearly decreased enhancement to many of the left hepatic lesions although there is evidence for residual tumor nodularity in some of these lesions.  In addition, there are small lesions in the central aspect of the liver that are likely receiving blood supply from both left and right hepatic arteries based on the previous cone beam CT images.  Overall, the patient's tumor vascularity is complex which makes it difficult to treat.  Patient has already received a large Y90 dose to the central aspect of the left hepatic artery distribution and I would like to avoid Y90 radioembolization in this area.  Will try to manage the liver disease with catheter directed bland embolization/chemoembolization if possible but the right hepatic lobe could receive another dose of Y90  radioembolization if needed. Most recent AFP is 940(504). On 06/19/22 pt underwent : 1. Visceral arteriography with catheterization of the left gastric artery and accessory left hepatic artery. 2. Bland embolization of the accessory left hepatic artery was not performed due to significant vasospasm and poor flow in the accessory left hepatic artery. Bland embolization will be re-attempted at another time  He presents again today for repeat attempt at bland [place] embolization.Risks and benefits of procedure were discussed with the patient including, but not limited to bleeding, infection, vascular injury or contrast induced renal failure.  This interventional procedure involves the use of X-rays and because of the nature of the planned procedure, it is possible that we will have prolonged use of X-ray fluoroscopy.  Potential radiation risks to you include (but are not limited to) the following: - A slightly elevated risk for cancer  several years later in life. This risk is typically less than 0.5% percent. This risk is low in comparison to the normal incidence of human cancer, which is 33% for women and 50% for men according to the [employer]. - Radiation induced injury can include skin redness, resembling a rash, tissue breakdown / ulcers and hair loss (which can be temporary or permanent).   The likelihood of either of these occurring depends on the difficulty of the procedure and whether you are sensitive to radiation due to previous procedures, disease, or genetic conditions.   IF your procedure requires a prolonged use of radiation, you will be notified and given written instructions for further action.  It is your responsibility to monitor the irradiated area for the 2 weeks following the  procedure and to notify your physician if you are concerned that you have suffered a radiation induced injury.    All of the patient's questions were answered, patient is agreeable to  proceed.  Consent signed and in chart.  LABS PENDING    Electronically Signed: D. Rowe Robert, PA-C 07/21/2022, 3:40 PM   I spent a total of  20 minutes at the the patient's bedside AND on the patient's hospital floor or unit, greater than 50% of which was counseling/coordinating care for bland embolization of hepatocellular carcinoma

## 2022-07-22 ENCOUNTER — Other Ambulatory Visit (HOSPITAL_COMMUNITY): Payer: Self-pay | Admitting: Diagnostic Radiology

## 2022-07-22 ENCOUNTER — Encounter (HOSPITAL_COMMUNITY): Payer: Self-pay

## 2022-07-22 ENCOUNTER — Ambulatory Visit (HOSPITAL_COMMUNITY)
Admission: RE | Admit: 2022-07-22 | Discharge: 2022-07-22 | Disposition: A | Discharge status: Home / Self Care | Payer: Medicare HMO | Source: Ambulatory Visit

## 2022-07-22 ENCOUNTER — Other Ambulatory Visit: Payer: Self-pay

## 2022-07-22 ENCOUNTER — Ambulatory Visit (HOSPITAL_COMMUNITY)
Admission: RE | Admit: 2022-07-22 | Discharge: 2022-07-22 | Disposition: A | Discharge status: Home / Self Care | Payer: Medicare HMO | Source: Ambulatory Visit | Attending: Diagnostic Radiology | Admitting: Diagnostic Radiology

## 2022-07-22 DIAGNOSIS — D649 Anemia, unspecified: Secondary | ICD-10-CM | POA: Diagnosis not present

## 2022-07-22 DIAGNOSIS — I10 Essential (primary) hypertension: Secondary | ICD-10-CM | POA: Diagnosis not present

## 2022-07-22 DIAGNOSIS — E785 Hyperlipidemia, unspecified: Secondary | ICD-10-CM | POA: Diagnosis not present

## 2022-07-22 DIAGNOSIS — E119 Type 2 diabetes mellitus without complications: Secondary | ICD-10-CM | POA: Insufficient documentation

## 2022-07-22 DIAGNOSIS — C220 Liver cell carcinoma: Secondary | ICD-10-CM

## 2022-07-22 HISTORY — PX: IR US GUIDE VASC ACCESS RIGHT: IMG2390

## 2022-07-22 HISTORY — PX: IR ANGIOGRAM VISCERAL SELECTIVE: IMG657

## 2022-07-22 HISTORY — PX: IR ANGIOGRAM SELECTIVE EACH ADDITIONAL VESSEL: IMG667

## 2022-07-22 HISTORY — PX: IR EMBO TUMOR ORGAN ISCHEMIA INFARCT INC GUIDE ROADMAPPING: IMG5449

## 2022-07-22 LAB — COMPREHENSIVE METABOLIC PANEL
ALT: 22 U/L (ref 0–44)
AST: 33 U/L (ref 15–41)
Albumin: 3.5 g/dL (ref 3.5–5.0)
Alkaline Phosphatase: 109 U/L (ref 38–126)
Anion gap: 8 (ref 5–15)
BUN: 32 mg/dL — ABNORMAL HIGH (ref 8–23)
CO2: 21 mmol/L — ABNORMAL LOW (ref 22–32)
Calcium: 8.6 mg/dL — ABNORMAL LOW (ref 8.9–10.3)
Chloride: 111 mmol/L (ref 98–111)
Creatinine, Ser: 2.18 mg/dL — ABNORMAL HIGH (ref 0.61–1.24)
GFR, Estimated: 31 mL/min — ABNORMAL LOW (ref >60)
Glucose, Bld: 100 mg/dL — ABNORMAL HIGH (ref 70–99)
Potassium: 4.4 mmol/L (ref 3.5–5.1)
Sodium: 140 mmol/L (ref 135–145)
Total Bilirubin: 0.5 mg/dL (ref 0.3–1.2)
Total Protein: 8.1 g/dL (ref 6.5–8.1)

## 2022-07-22 LAB — CBC WITH DIFFERENTIAL/PLATELET
Abs Immature Granulocytes: 0.03 10*3/uL (ref 0.00–0.07)
Basophils Absolute: 0 10*3/uL (ref 0.0–0.1)
Basophils Relative: 1 %
Eosinophils Absolute: 0.1 10*3/uL (ref 0.0–0.5)
Eosinophils Relative: 3 %
HCT: 40.2 % (ref 39.0–52.0)
Hemoglobin: 11.9 g/dL — ABNORMAL LOW (ref 13.0–17.0)
Immature Granulocytes: 1 %
Lymphocytes Relative: 23 %
Lymphs Abs: 1.2 10*3/uL (ref 0.7–4.0)
MCH: 22.9 pg — ABNORMAL LOW (ref 26.0–34.0)
MCHC: 29.6 g/dL — ABNORMAL LOW (ref 30.0–36.0)
MCV: 77.3 fL — ABNORMAL LOW (ref 80.0–100.0)
Monocytes Absolute: 0.5 10*3/uL (ref 0.1–1.0)
Monocytes Relative: 9 %
Neutro Abs: 3.4 10*3/uL (ref 1.7–7.7)
Neutrophils Relative %: 63 %
Platelets: 218 10*3/uL (ref 150–400)
RBC: 5.2 MIL/uL (ref 4.22–5.81)
RDW: 16.2 % — ABNORMAL HIGH (ref 11.5–15.5)
WBC: 5.3 10*3/uL (ref 4.0–10.5)
nRBC: 0 % (ref 0.0–0.2)

## 2022-07-22 LAB — PROTIME-INR
INR: 1.1 (ref 0.8–1.2)
Prothrombin Time: 14.4 seconds (ref 11.4–15.2)

## 2022-07-22 LAB — GLUCOSE, CAPILLARY: Glucose-Capillary: 103 mg/dL — ABNORMAL HIGH (ref 70–99)

## 2022-07-22 MED ORDER — FENTANYL CITRATE (PF) 100 MCG/2ML IJ SOLN
INTRAMUSCULAR | Status: AC | PRN
  Administered 2022-07-22 (×2): 50 ug via INTRAVENOUS

## 2022-07-22 MED ORDER — MIDAZOLAM HCL 2 MG/2ML IJ SOLN
INTRAMUSCULAR | Status: AC | PRN
  Administered 2022-07-22 (×2): 1 mg via INTRAVENOUS

## 2022-07-22 MED ORDER — FENTANYL CITRATE (PF) 100 MCG/2ML IJ SOLN
INTRAMUSCULAR | Status: AC
  Filled 2022-07-22: qty 2

## 2022-07-22 MED ORDER — HYDROCODONE-ACETAMINOPHEN 5-325 MG PO TABS
1.0000 | ORAL_TABLET | ORAL | Status: DC | PRN
  Administered 2022-07-22 (×2): 1 via ORAL
  Filled 2022-07-22 (×2): qty 1

## 2022-07-22 MED ORDER — SODIUM CHLORIDE 0.9 % IV SOLN
2.0000 g | Freq: Once | INTRAVENOUS | Status: AC
  Administered 2022-07-22: 2 g via INTRAVENOUS
  Filled 2022-07-22: qty 2

## 2022-07-22 MED ORDER — SODIUM CHLORIDE 0.9 % IV SOLN
8.0000 mg | Freq: Once | INTRAVENOUS | Status: AC
  Administered 2022-07-22: 8 mg via INTRAVENOUS
  Filled 2022-07-22: qty 4

## 2022-07-22 MED ORDER — MIDAZOLAM HCL 2 MG/2ML IJ SOLN
INTRAMUSCULAR | Status: AC
  Filled 2022-07-22: qty 4

## 2022-07-22 MED ORDER — LIDOCAINE HCL (PF) 1 % IJ SOLN
INTRAMUSCULAR | Status: AC
  Administered 2022-07-22: 18 mL via INTRADERMAL
  Filled 2022-07-22: qty 30

## 2022-07-22 MED ORDER — IODIXANOL 320 MG/ML IV SOLN
50.0000 mL | Freq: Once | INTRAVENOUS | Status: AC | PRN
  Administered 2022-07-22: 11 mL via INTRA_ARTERIAL

## 2022-07-22 MED ORDER — PANTOPRAZOLE SODIUM 40 MG IV SOLR
40.0000 mg | Freq: Once | INTRAVENOUS | Status: AC
  Administered 2022-07-22: 40 mg via INTRAVENOUS
  Filled 2022-07-22: qty 10

## 2022-07-22 MED ORDER — DEXAMETHASONE SODIUM PHOSPHATE 10 MG/ML IJ SOLN
4.0000 mg | Freq: Once | INTRAMUSCULAR | Status: AC
  Administered 2022-07-22: 4 mg via INTRAVENOUS
  Filled 2022-07-22: qty 1

## 2022-07-22 MED ORDER — SODIUM CHLORIDE 0.9 % IV SOLN: INTRAVENOUS | Status: DC

## 2022-07-22 NOTE — Discharge Instructions (Addendum)
Discharge Instructions:   Please call Interventional Radiology clinic [phone] with any questions or concerns.  You may remove your dressing and shower tomorrow, 07-23-22.  Please see Angio-seal vascular closure device patient guide.

## 2022-07-22 NOTE — Procedures (Signed)
Interventional Radiology Procedure:   Indications: Multifocal hepatocellular carcinoma  Procedure: Catheter directed bland embolization of left hepatic arteries.  Angiography of left gastric artery.  Findings: Accessory left hepatic artery is now occluded at it's left gastric artery origin.  There is collateral flow into accessory left hepatic artery from main left hepatic arteries. Therefore, main left hepatic arteries were treated with 100-300 micron Embospheres.  Near stasis in these vessels after embolization. AngioSeal used from right groin closure.   Complications: No immediate complications noted.     EBL: Minimal  Plan: Bedrest 3 hours, then discharge to home.   Ibeth Fahmy R. Anselm Pancoast, MD  Pager: [phone]

## 2022-07-23 ENCOUNTER — Other Ambulatory Visit: Payer: Self-pay | Admitting: Diagnostic Radiology

## 2022-07-23 DIAGNOSIS — C220 Liver cell carcinoma: Secondary | ICD-10-CM

## 2022-07-24 ENCOUNTER — Other Ambulatory Visit (HOSPITAL_COMMUNITY): Payer: Self-pay | Admitting: Diagnostic Radiology

## 2022-07-24 DIAGNOSIS — C220 Liver cell carcinoma: Secondary | ICD-10-CM

## 2022-07-24 LAB — AFP TUMOR MARKER: AFP, Serum, Tumor Marker: 994 ng/mL — ABNORMAL HIGH (ref 0.0–8.4)

## 2022-07-24 LAB — CEA: CEA: 2.1 ng/mL (ref 0.0–4.7)

## 2022-07-31 ENCOUNTER — Other Ambulatory Visit: Payer: Self-pay

## 2022-07-31 DIAGNOSIS — C220 Liver cell carcinoma: Secondary | ICD-10-CM

## 2022-08-03 ENCOUNTER — Inpatient Hospital Stay: Payer: Medicare HMO | Attending: Hematology

## 2022-08-04 ENCOUNTER — Inpatient Hospital Stay: Payer: Medicare HMO | Admitting: Hematology

## 2022-08-04 NOTE — Progress Notes (Shared)
HEMATOLOGY/ONCOLOGY CLINIC NOTE  Date of Service: 08/04/22  Patient Care Team: [name], DO as PCP - General (Family Medicine)  CHIEF COMPLAINTS/PURPOSE OF CONSULTATION:  Follow-up for continued evaluation and management of hepatocellular carcinoma.  HISTORY OF PRESENTING ILLNESS:  Please see previous note for details on initial presentation  INTERVAL HISTORY: .I connected with [name] on 08/04/2022 at  3:30 PM EST by telephone visit and verified that I am speaking with the correct person using two identifiers.  Patient was last seen by me on 07/03/2022 and was doing well overall. During his last visit, he lost about 7 lbs in the last 5 to 6 months.     I discussed the limitations, risks, security and privacy concerns of performing an evaluation and management service by telemedicine and the availability of in-person appointments. I also discussed with the patient that there may be a patient responsible charge related to this service. The patient expressed understanding and agreed to proceed.   Other persons participating in the visit and their role in the encounter: None   Patient's location: Home  Provider's location: [facility]   Chief Complaint: [facility]    MEDICAL HISTORY:  Past Medical History:  Diagnosis Date   Arthritis    Diabetes mellitus without complication ([place])    type II    Dyspnea    Elevated liver enzymes    Fatty liver    Hepatitis C    genotype 1A status post treatment with Linzie Collin and Ribavarin for 24 weeks   Hepatocellular carcinoma ([place]) 01/30/2014   Path   History of colon polyps    Hyperlipidemia    Hypertension    Iron deficiency anemia    hx of    MGUS (monoclonal gammopathy of unknown significance)    Obesity Hepatocellular carcinoma treated with TACE and percutaneous thermal ablation by interventional radiology. Last MRI on 08/06/2015 showed slight decrease in the size of the ablation defect involving the right lobe of the liver  area and no findings to suggest residual or recurrent hepatocellular carcinoma. Mild changes of liver cirrhosis. MRI Abd 06/24/2016- no evidence of recurrent HCC   Hepatitis C genotype 1A status post treatment with Viekira and Ribavarin for 24 weeks.  Monoclonal gammopathy of undetermined significance.  SURGICAL HISTORY:  Status post microwave ablation[October 2015] of liver lesion and TACE [July 2015] for [place]. EGD and colonoscopy in 2016   SOCIAL HISTORY: Social History   Socioeconomic History   Marital status: Married    Spouse name: Not on file   Number of children: Not on file   Years of education: Not on file   Highest education level: Not on file  Occupational History   Not on file  Tobacco Use   Smoking status: Former    Types: Cigarettes    Quit date: 01/30/1994    Years since quitting: 28.5   Smokeless tobacco: Never   Tobacco comments:    stopped 25 yrs ago  Vaping Use   Vaping Use: Never used  Substance and Sexual Activity   Alcohol use: No    Comment: stopped 30 years ago    Drug use: No   Sexual activity: Not Currently  Other Topics Concern   Not on file  Social History Narrative   Not on file   Social Determinants of Health   Financial Resource Strain: Not on file  Food Insecurity: Not on file  Transportation Needs: Not on file  Physical Activity: Not on file  Stress: Not  on file  Social Connections: Not on file  Intimate Partner Violence: Not on file  Former smoker and smoked 1 pack per day for about 20 years starting at age 20 quit 77 years ago.  FAMILY HISTORY: No family history on file.  ALLERGIES:  has No Known Allergies.  MEDICATIONS:  Current Outpatient Medications  Medication Sig Dispense Refill   ACCU-CHEK AVIVA PLUS test strip      Accu-Chek Softclix Lancets lancets      amLODipine (NORVASC) 10 MG tablet TAKE 1 TABLET BY MOUTH EVERY DAY 90 tablet 1   aspirin EC 81 MG tablet Take 81 mg by mouth every morning.      cholecalciferol  (VITAMIN D) 1000 UNITS tablet Take 1,000 Units by mouth every morning.      glipiZIDE (GLUCOTROL XL) 2.5 MG 24 hr tablet Take 2.5 mg by mouth daily with breakfast.     Insulin Glargine (LANTUS SOLOSTAR) 100 UNIT/ML Solostar Pen Inject 32 Units into the skin daily.     lenvatinib 12 mg daily dose (LENVIMA) 3 x 4 MG capsule TAKE 3 CAPSULES (12MG) BY MOUTH DAILY. 90 each 2   Metoprolol Succinate 50 MG CS24 1 capsule     Multiple Vitamin (MULTIVITAMIN WITH MINERALS) TABS tablet Take 1 tablet daily by mouth.     oxyCODONE (OXY IR/ROXICODONE) 5 MG immediate release tablet Take 1 tablet (5 mg total) by mouth every 6 (six) hours as needed for severe pain. 30 tablet 0   PAZEO 0.7 % SOLN Place 1 drop into both eyes every morning.  3   senna-docusate (SENNA S) 8.6-50 MG tablet Take 2 tablets by mouth at bedtime as needed for mild constipation or moderate constipation. 60 tablet 1   simvastatin (ZOCOR) 20 MG tablet Take 20 mg by mouth daily.     No current facility-administered medications for this visit.   Facility-Administered Medications Ordered in Other Visits  Medication Dose Route Frequency Provider Last Rate Last Admin   dexamethasone (DECADRON) injection 8 mg  8 mg Intravenous Once Narda Rutherford T, NP       ondansetron [facility]) 8 mg in sodium chloride 0.9 % 50 mL IVPB  8 mg Intravenous Once Narda Rutherford T, NP       pantoprazole (PROTONIX) injection 40 mg  40 mg Intravenous Once Tyson Alias, NP        REVIEW OF SYSTEMS:   10 Point review of Systems was done is negative except as noted above.  PHYSICAL EXAMINATION: There were no vitals filed for this visit. NAD GENERAL:alert, in no acute distress and comfortable SKIN: no acute rashes, no significant lesions EYES: conjunctiva are pink and non-injected, sclera anicteric OROPHARYNX: MMM, no exudates, no oropharyngeal erythema or ulceration NECK: supple, no JVD LYMPH:  no palpable lymphadenopathy in the cervical, axillary or inguinal  regions LUNGS: clear to auscultation b/l with normal respiratory effort HEART: regular rate & rhythm ABDOMEN:  normoactive bowel sounds , non tender, not distended. Extremity: no pedal edema PSYCH: alert & oriented x 3 with fluent speech NEURO: no focal motor/sensory deficits   LABORATORY DATA:  I have reviewed the data as listed     Latest Ref Rng & Units 07/22/2022    8:12 AM 07/03/2022    8:13 AM 06/19/2022    8:00 AM  CBC  WBC 4.0 - 10.5 K/uL 5.3  6.6  5.7   Hemoglobin 13.0 - 17.0 g/dL 11.9  12.8  11.7   Hematocrit 39.0 - 52.0 % 40.2  41.3  38.8   Platelets 150 - 400 K/uL 218  279  159     CBC    Component Value Date/Time   WBC 5.3 07/22/2022 0812   RBC 5.20 07/22/2022 0812   HGB 11.9 (L) 07/22/2022 0812   HGB 12.8 (L) 07/03/2022 0813   HGB 11.5 (L) 03/22/2017 1402   HCT 40.2 07/22/2022 0812   HCT 37.0 (L) 03/22/2017 1402   PLT 218 07/22/2022 0812   PLT 279 07/03/2022 0813   PLT 262 03/22/2017 1402   MCV 77.3 (L) 07/22/2022 0812   MCV 72.3 (L) 03/22/2017 1402   MCH 22.9 (L) 07/22/2022 0812   MCHC 29.6 (L) 07/22/2022 0812   RDW 16.2 (H) 07/22/2022 0812   RDW 15.6 (H) 03/22/2017 1402   LYMPHSABS 1.2 07/22/2022 0812   LYMPHSABS 1.8 03/22/2017 1402   MONOABS 0.5 07/22/2022 0812   MONOABS 0.4 03/22/2017 1402   EOSABS 0.1 07/22/2022 0812   EOSABS 0.1 03/22/2017 1402   BASOSABS 0.0 07/22/2022 0812   BASOSABS 0.0 03/22/2017 1402    .    Latest Ref Rng & Units 07/22/2022    8:12 AM 07/03/2022    8:13 AM 06/19/2022    8:00 AM  CMP  Glucose 70 - 99 mg/dL 100  107  112   BUN 8 - 23 mg/dL 32  35  32   Creatinine 0.61 - 1.24 mg/dL 2.18  2.40  2.04   Sodium 135 - 145 mmol/L 140  140  139   Potassium 3.5 - 5.1 mmol/L 4.4  4.8  4.7   Chloride 98 - 111 mmol/L 111  104  108   CO2 22 - 32 mmol/L _0 Calcium 8.9 - 10.3 mg/dL 8.6  9.5  8.8   Total Protein 6.5 - 8.1 g/dL 8.1  8.6  7.5   Total Bilirubin 0.3 - 1.2 mg/dL 0.5  0.6  0.9   Alkaline Phos 38 - 126  U/L 109  143  78   AST 15 - 41 U/L 33  33  43   ALT 0 - 44 U/L _1 06/21/2019 MR ABDOMEN WWO CONTRAST (Accession [id number])     IFE 1  Comment    Comments: Immunofixation shows IgG monoclonal protein with kappa light chain  specificity.           RADIOGRAPHIC STUDIES:  MRI abd w and wo contrast: 12/17/2016: IMPRESSION: 1. Postprocedural changes of thermal ablation again noted in the right lobe of the liver, without definitive evidence to suggest local recurrence of disease. No new hepatic lesions are noted. 2. Additional incidental findings, as above.     Electronically Signed   By: Vinnie Langton M.D.   On: 12/17/2016 09:42  MRI abd w and wo contrast 01/26/2020 IMPRESSION: 1. Interval enlargement of multiple hepatic masses as detailed above, consistent with progression of multifocal hepatocellular carcinoma. There is evidence of prior ablation in hepatic segments VII and VIII. 2. No evidence of metastatic disease in the abdomen.   Electronically Signed   By: Eddie Candle M.D.   On: 01/27/2020 15:51  MRI abd and wo contrast 10/04/2021 FINDINGS: Lower chest: Unremarkable   Hepatobiliary: Multiple liver lesions are present, some of these represent previously treated tumors and some represent active enhancing malignancy.   An index solid enhancing lesion in the lateral segment left hepatic lobe measures 3.4 by 2.8 cm on image 15 series  2, formerly 3.5 by 2.7 cm, essentially stable. Just below this a T2 hyperintense lesion measuring 1.5 by 1.1 cm on image 18 of series 2 previously measured 1.4 by 1.2 cm, likewise stable.   A currently enhancing lesion in the right hepatic lobe adjacent to the IVC measures 1.8 by 1.6 cm on image 13 series 2, stable.   A mostly centrally necrotic a 3.6 by 3.1 cm mostly centrally necrotic lesion in segment 4a of the liver on image 29 of series 21 previously measured 4.3 by 3.0 cm. This lesion is mostly  centrally necrotic today although with some nodular enhancement along its margin as on image 29 series 21.   Previously treated right hepatic lobe lesions are observed, and mostly nonenhancing or poorly enhancing.   A somewhat indistinctly marginated lesion posteriorly in segment 4a of the liver is diffusely enhancing and measures about 2.9 by 2.8 cm on image 28 of series 21. A previous faintly T2 hyperintense lesion in this vicinity on 02/20/2021 measured 2.0 by 1.6 cm, and accordingly this represents enlargement of this enhancing mass.   An enhancing mass laterally in segment 3 of the liver measures 3.0 by 2.6 cm on image 31 series 8, formerly 2.9 by 2.6 cm, essentially stable.   No biliary dilatation is observed.   Pancreas:  Unremarkable   Spleen:  Unremarkable   Adrenals/Urinary Tract:  Unremarkable   Stomach/Bowel: Unremarkable   Vascular/Lymphatic: Atherosclerosis is present, including aortoiliac atherosclerotic disease.   Other:  No supplemental non-categorized findings.   Musculoskeletal: Unchanged mild lower lumbar spondylosis and degenerative disc disease.   IMPRESSION: 1. Mixed appearance, with some of the enhancing liver lesions stable; some improved such as the centrally necrotic anterior segment 4A lesion which demonstrates substantial central necrosis compared to previous; but with at least 1 worsened lesion which is the posterior segment 4a lesion (image 29, series 20), moderately increased in size. 2. Aortic Atherosclerosis (ICD10-I70.0). 3. Mild lower lumbar spondylosis and degenerative disc disease.  Electronically Signed   By: Van Clines M.D.   On: 10/06/2021 11:08  ASSESSMENT & PLAN:   76 y.o. male with  #1 Monoclonal gammopathy of undetermined significance.  M protein 0.2 mg/dL immunofixation showing IgG monoclonal protein with kappa light chain specificity. SPEP 02/2016 - 0.3g/dl  No overt anemia.  No overt hypercalcemia. Stable  CKD creatinine 1.6  #2 bone lucencies in the right radial mid midshaft and left humeral head. PET/CT did not show any hypermetabolic bone lesions. MRI left shoulder showed no evidence of metastatic disease or multiple myeloma. The x-ray findings appear to be areas of mild demineralization. Patient was seen by orthopedics and no additional recommendations were given.  Plan -Patient's anemia is stable. No significant change in renal function. No new bone pains. No hypercalcemia . No overall no overt evidence of progression of multiple myeloma. -Myeloma labs from today show stable M protein @ 0.2g/dl with no evidence of progression- consistent with MGUS.  #3 Hepatocellular carcinoma treated with TACE and percutaneous thermal ablation , Y 90 and recent bland arterial embolization on 06/06/2021 by interventional radiology.   Patient had bland embolization on 02/05/2022 for his hepatocellular carcinoma.   Last MRI on 08/06/2015 showed slight decrease in the size of the ablation defect involving the right lobe of the liver area and no findings to suggest residual or recurrent hepatocellular carcinoma. Mild changes of liver cirrhosis.  01/16/2016 MRI Abdomen showed resolution of previously ablated lesion but a new 1.3 cm lesion was noted which  was subsequently ablated under CT guidance by interventional radiology on 01/31/2016. Elevated transaminases on 02/01/2016 were likely related to his ablation. These have since resolved.   06/24/2016 MRI Abdomen showed no evidence of recurrent hepatocellular carcinoma . 12/17/2016 MRI Abdomen shows no evidence of recurrent hepatocellular carcinoma .  10/26/17 MRI Abdomen revealed Motion degraded images. Status post thermal ablation of the segment 8 lesion, without enhancement to suggest residual viable tumor. Prior ablation of a segment 7 lesion, grossly unchanged. No convincing central enhancement  03/14/2019  MRI abdomen w and wo contrast revealed "1. New areas of  restricted diffusion, surrounding nodular arterial phase enhancement and delayed washout surrounding the ablation zone defects anteriorly in segments 8 and 7 consistent with local recurrence of hepatocellular carcinoma. 2. The peripheral ablation zone defect laterally in segment 8 is unchanged. 3. No extrahepatic tumor identified."  10/02/2019 MRI Abdomen ([id number]) revealed "1. Progressive multifocal hepatocellular carcinoma, especially anteriorly around the ablated lesion in segments 4B and 8. There are additional new lesions in segments 2 and 3. No evidence of extrahepatic metastatic disease."  01/26/2020 MRI Abdomen ([id number]) revealed "1. Interval enlargement of multiple hepatic masses as detailed above, consistent with progression of multifocal hepatocellular carcinoma. There is evidence of prior ablation in hepatic segments VII and VIII. 2. No evidence of metastatic disease in the abdomen."  10/04/2021 MRI Abdomen ([id number]) revealed "1. Mixed appearance, with some of the enhancing liver lesions stable; some improved such as the centrally necrotic anterior segment 4A lesion which demonstrates substantial central necrosis compared to previous; but with at least 1 worsened lesion which is the posterior segment 4a lesion (image 29, series 20), moderately increased in size."  04/09/2022 MRI Abdomen ([id number]) revealed "1. Again noted are multiple lesions involving both lobes of liver compatible with multifocal hepatocellular carcinoma. When compared with the previous exam there no significant change in the overall tumor volume from 10/04/2021. 2. Interval treatment response with hypoenhancement of lesion within the segment 3 and the small lesion within dome of segment 2. 3. The remaining lesions continue to exhibit variable degrees of internal enhancement. 4. There is a new enhancing lesion within the dome of liver (segment 8/4a). 5. Aortic Atherosclerosis"   #4 Hepatitis C genotype 1A status  post treatment with Viekira and Ribavarin for 24 weeks.   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    PLAN:  -Patient has no new clinical symptoms since his last clinic visit.   - labs done today were discussed in detail with him and show stable CBC and CMP  -AFP tumor marker up from 381 to 504 to 940 today showing disease progression. -He is prescheduled for bland embolization to the dominant lesion by interventional radiology on 07/22/2022. -Depending on response to treatment we will consider getting Guardant360 and might need to switch his lenvatinib to Cabozantinib or if Guardant360 shows high tumor mutation burden might consider immunotherapy combination again or if RET oncogene positive can consider targeted therapy for that. -He will continue his lenvatinib at this time at the current dose of 12 mg p.o. daily. -He has been recommended to hold his lenvatinib for 2 weeks after his bland embolization and restarted if his repeat labs show stable liver functions.  -Discussed lab results from 07/22/2022 with the patient. CBC stable. CMP shows elevated Creatinine of 2.18.  FOLLOW-UP: *** The total time spent in the appointment was *** minutes* .  All of the patient's questions were answered with apparent  satisfaction. The patient knows to call the clinic with any problems, questions or concerns.   [name] MD MS AAHIVMS [facility] Hematology/Oncology Physician [facility]  .*Total Encounter Time as defined by the Centers for Medicare and Medicaid Services includes, in addition to the face-to-face time of a patient visit (documented in the note above) non-face-to-face time: obtaining and reviewing outside history, ordering and reviewing medications, tests or procedures, care coordination (communications with other health care professionals or caregivers) and documentation in the medical record.   Zettie Cooley, am acting as a Education administrator for [name], MD.

## 2022-08-06 ENCOUNTER — Telehealth: Payer: Medicare HMO

## 2022-08-06 ENCOUNTER — Ambulatory Visit
Admission: RE | Admit: 2022-08-06 | Discharge: 2022-08-06 | Disposition: A | Discharge status: Home / Self Care | Payer: Medicare HMO | Source: Ambulatory Visit | Attending: Diagnostic Radiology | Admitting: Diagnostic Radiology

## 2022-08-06 DIAGNOSIS — C220 Liver cell carcinoma: Secondary | ICD-10-CM

## 2022-08-06 NOTE — Progress Notes (Signed)
Chief Complaint: Patient was consulted remotely today ([place]) for follow-up hepatocellular carcinoma  Referring Physician(s): [name]  History of Present Illness: Aaron [name] is a 76 y.o. with multifocal hepatocellular carcinoma.  The patient has undergone systemic therapy and multiple liver directed therapies including ablation, chemoembolization, Y90 radioembolization and bland embolization.  Patient's AFP level has been increasing and known to have residual disease primarily in the left hepatic lobe.  Patient underwent bland embolization to the left hepatic arteries on 07/22/2022.  Patient reports significant back pain following the procedure which has subsequently resolved.  He feels that the back pain was related to positioning during and after the procedure.  He denies fevers, chills, nausea, vomiting or abdominal pain.  Patient has restarted his lenvatinib.    Past Medical History:  Diagnosis Date   Arthritis    Diabetes mellitus without complication ([place])    type II    Dyspnea    Elevated liver enzymes    Fatty liver    Hepatitis C    genotype 1A status post treatment with Linzie Collin and Ribavarin for 24 weeks   Hepatocellular carcinoma ([place]) 01/30/2014   Path   History of colon polyps    Hyperlipidemia    Hypertension    Iron deficiency anemia    hx of    MGUS (monoclonal gammopathy of unknown significance)     Past Surgical History:  Procedure Laterality Date   COLONOSCOPY     ESOPHAGOGASTRODUODENOSCOPY  2016   IR 3D INDEPENDENT WKST  01/23/2022   IR ANGIOGRAM SELECTIVE EACH ADDITIONAL VESSEL  04/04/2019   IR ANGIOGRAM SELECTIVE EACH ADDITIONAL VESSEL  04/04/2019   IR ANGIOGRAM SELECTIVE EACH ADDITIONAL VESSEL  04/04/2019   IR ANGIOGRAM SELECTIVE EACH ADDITIONAL VESSEL  04/04/2019   IR ANGIOGRAM SELECTIVE EACH ADDITIONAL VESSEL  04/04/2019   IR ANGIOGRAM SELECTIVE EACH ADDITIONAL VESSEL  04/04/2019   IR ANGIOGRAM SELECTIVE EACH ADDITIONAL VESSEL  04/04/2019    IR ANGIOGRAM SELECTIVE EACH ADDITIONAL VESSEL  05/16/2019   IR ANGIOGRAM SELECTIVE EACH ADDITIONAL VESSEL  05/16/2019   IR ANGIOGRAM SELECTIVE EACH ADDITIONAL VESSEL  05/16/2019   IR ANGIOGRAM SELECTIVE EACH ADDITIONAL VESSEL  02/14/2020   IR ANGIOGRAM SELECTIVE EACH ADDITIONAL VESSEL  02/14/2020   IR ANGIOGRAM SELECTIVE EACH ADDITIONAL VESSEL  02/14/2020   IR ANGIOGRAM SELECTIVE EACH ADDITIONAL VESSEL  02/14/2020   IR ANGIOGRAM SELECTIVE EACH ADDITIONAL VESSEL  03/01/2020   IR ANGIOGRAM SELECTIVE EACH ADDITIONAL VESSEL  03/01/2020   IR ANGIOGRAM SELECTIVE EACH ADDITIONAL VESSEL  03/01/2020   IR ANGIOGRAM SELECTIVE EACH ADDITIONAL VESSEL  03/01/2020   IR ANGIOGRAM SELECTIVE EACH ADDITIONAL VESSEL  03/01/2020   IR ANGIOGRAM SELECTIVE EACH ADDITIONAL VESSEL  06/18/2020   IR ANGIOGRAM SELECTIVE EACH ADDITIONAL VESSEL  06/18/2020   IR ANGIOGRAM SELECTIVE EACH ADDITIONAL VESSEL  11/19/2020   IR ANGIOGRAM SELECTIVE EACH ADDITIONAL VESSEL  11/19/2020   IR ANGIOGRAM SELECTIVE EACH ADDITIONAL VESSEL  06/06/2021   IR ANGIOGRAM SELECTIVE EACH ADDITIONAL VESSEL  06/06/2021   IR ANGIOGRAM SELECTIVE EACH ADDITIONAL VESSEL  06/06/2021   IR ANGIOGRAM SELECTIVE EACH ADDITIONAL VESSEL  06/06/2021   IR ANGIOGRAM SELECTIVE EACH ADDITIONAL VESSEL  01/23/2022   IR ANGIOGRAM SELECTIVE EACH ADDITIONAL VESSEL  01/23/2022   IR ANGIOGRAM SELECTIVE EACH ADDITIONAL VESSEL  02/05/2022   IR ANGIOGRAM SELECTIVE EACH ADDITIONAL VESSEL  02/05/2022   IR ANGIOGRAM SELECTIVE EACH ADDITIONAL VESSEL  06/19/2022   IR ANGIOGRAM SELECTIVE EACH ADDITIONAL VESSEL  06/19/2022   IR ANGIOGRAM SELECTIVE EACH ADDITIONAL  VESSEL  07/22/2022   IR ANGIOGRAM SELECTIVE EACH ADDITIONAL VESSEL  07/22/2022   IR ANGIOGRAM VISCERAL SELECTIVE  04/04/2019   IR ANGIOGRAM VISCERAL SELECTIVE  05/16/2019   IR ANGIOGRAM VISCERAL SELECTIVE  02/14/2020   IR ANGIOGRAM VISCERAL SELECTIVE  02/14/2020   IR ANGIOGRAM VISCERAL SELECTIVE  03/01/2020   IR ANGIOGRAM VISCERAL SELECTIVE   06/18/2020   IR ANGIOGRAM VISCERAL SELECTIVE  11/19/2020   IR ANGIOGRAM VISCERAL SELECTIVE  01/23/2022   IR ANGIOGRAM VISCERAL SELECTIVE  02/05/2022   IR ANGIOGRAM VISCERAL SELECTIVE  06/19/2022   IR ANGIOGRAM VISCERAL SELECTIVE  07/22/2022   IR EMBO ARTERIAL NOT HEMORR HEMANG INC GUIDE ROADMAPPING  02/14/2020   IR EMBO TUMOR ORGAN ISCHEMIA INFARCT INC GUIDE ROADMAPPING  04/04/2019   IR EMBO TUMOR ORGAN ISCHEMIA INFARCT INC GUIDE ROADMAPPING  05/16/2019   IR EMBO TUMOR ORGAN ISCHEMIA INFARCT INC GUIDE ROADMAPPING  03/01/2020   IR EMBO TUMOR ORGAN ISCHEMIA INFARCT INC GUIDE ROADMAPPING  06/18/2020   IR EMBO TUMOR ORGAN ISCHEMIA INFARCT INC GUIDE ROADMAPPING  11/19/2020   IR EMBO TUMOR ORGAN ISCHEMIA INFARCT INC GUIDE ROADMAPPING  06/06/2021   IR EMBO TUMOR ORGAN ISCHEMIA INFARCT INC GUIDE ROADMAPPING  02/05/2022   IR EMBO TUMOR ORGAN ISCHEMIA INFARCT INC GUIDE ROADMAPPING  07/22/2022   IR GENERIC HISTORICAL  02/26/2016   IR RADIOLOGIST EVAL & MGMT 02/26/2016 Markus Daft, MD [facility]   IR GENERIC HISTORICAL  01/16/2016   IR RADIOLOGIST EVAL & MGMT 01/16/2016 Markus Daft, MD [facility]   IR GENERIC HISTORICAL  06/24/2016   IR RADIOLOGIST EVAL & MGMT 06/24/2016 Aletta Edouard, MD [facility]   IR RADIOLOGIST EVAL & MGMT  12/17/2016   IR RADIOLOGIST EVAL & MGMT  06/16/2017   IR RADIOLOGIST EVAL & MGMT  09/08/2017   IR RADIOLOGIST EVAL & MGMT  10/26/2017   IR RADIOLOGIST EVAL & MGMT  05/12/2018   IR RADIOLOGIST EVAL & MGMT  07/26/2018   IR RADIOLOGIST EVAL & MGMT  03/22/2019   IR RADIOLOGIST EVAL & MGMT  04/11/2019   IR RADIOLOGIST EVAL & MGMT  06/28/2019   IR RADIOLOGIST EVAL & MGMT  10/10/2019   IR RADIOLOGIST EVAL & MGMT  05/21/2020   IR RADIOLOGIST EVAL & MGMT  06/12/2020   IR RADIOLOGIST EVAL & MGMT  10/24/2020   IR RADIOLOGIST EVAL & MGMT  12/11/2020   IR RADIOLOGIST EVAL & MGMT  02/27/2021   IR RADIOLOGIST EVAL & MGMT  11/25/2021   IR RADIOLOGIST EVAL & MGMT  03/20/2022   IR RADIOLOGIST EVAL &  MGMT  04/14/2022   IR US GUIDE VASC ACCESS LEFT  05/16/2019   IR US GUIDE VASC ACCESS LEFT  02/05/2022   IR US GUIDE VASC ACCESS RIGHT  04/04/2019   IR US GUIDE VASC ACCESS RIGHT  02/14/2020   IR US GUIDE VASC ACCESS RIGHT  03/01/2020   IR US GUIDE VASC ACCESS RIGHT  06/18/2020   IR US GUIDE VASC ACCESS RIGHT  11/19/2020   IR US GUIDE VASC ACCESS RIGHT  06/06/2021   IR US GUIDE VASC ACCESS RIGHT  01/23/2022   IR US GUIDE VASC ACCESS RIGHT  06/19/2022   IR US GUIDE VASC ACCESS RIGHT  07/22/2022   POLYPECTOMY     RADIOFREQUENCY ABLATION N/A 07/21/2017   Procedure: CT MICROWAVE THERMAL ABLATION-LIVER;  Surgeon: Markus Daft, MD;  Location: [facility];  Service: Anesthesiology;  Laterality: N/A;   RADIOFREQUENCY ABLATION N/A 06/29/2018   Procedure: CT MICROWAVE THERMAL  ABLATION;  Surgeon: Markus Daft, MD;  Location: [facility];  Service: Anesthesiology;  Laterality: N/A;    Allergies: Patient has no known allergies.  Medications: Prior to Admission medications   Medication Sig Start Date End Date Taking? Authorizing Provider  ACCU-CHEK AVIVA PLUS test strip  11/19/19   [provider]  Accu-Chek Softclix Lancets lancets  09/12/19   [provider]  amLODipine (NORVASC) 10 MG tablet TAKE 1 TABLET BY MOUTH EVERY DAY 07/01/22   Brunetta Genera, MD  aspirin EC 81 MG tablet Take 81 mg by mouth every morning.     [provider]  cholecalciferol (VITAMIN D) 1000 UNITS tablet Take 1,000 Units by mouth every morning.     [provider]  glipiZIDE (GLUCOTROL XL) 2.5 MG 24 hr tablet Take 2.5 mg by mouth daily with breakfast.    [provider]  Insulin Glargine (LANTUS SOLOSTAR) 100 UNIT/ML Solostar Pen Inject 32 Units into the skin daily.    [provider]  lenvatinib 12 mg daily dose (LENVIMA) 3 x 4 MG capsule TAKE 3 CAPSULES ('12MG'$ ) BY MOUTH DAILY. 06/04/22 06/04/23  Brunetta Genera, MD  Metoprolol Succinate 50 MG CS24 1 capsule    [provider]  Multiple Vitamin (MULTIVITAMIN WITH MINERALS) TABS tablet Take 1 tablet daily by mouth.    [provider]  oxyCODONE (OXY IR/ROXICODONE) 5 MG immediate release tablet Take 1 tablet (5 mg total) by mouth every 6 (six) hours as needed for severe pain. 07/13/22   Brunetta Genera, MD  PAZEO 0.7 % SOLN Place 1 drop into both eyes every morning. 05/09/18   [provider]  senna-docusate (SENNA S) 8.6-50 MG tablet Take 2 tablets by mouth at bedtime as needed for mild constipation or moderate constipation. 07/13/22   Brunetta Genera, MD  simvastatin (ZOCOR) 20 MG tablet Take 20 mg by mouth daily.    [provider]     No family history on file.  Social History   Socioeconomic History   Marital status: Married    Spouse name: Not on file   Number of children: Not on file   Years of education: Not on file   Highest education level: Not on file  Occupational History   Not on file  Tobacco Use   Smoking status: Former    Types: Cigarettes    Quit date: 01/30/1994    Years since quitting: 28.5   Smokeless tobacco: Never   Tobacco comments:    stopped 25 yrs ago  Vaping Use   Vaping Use: Never used  Substance and Sexual Activity   Alcohol use: No    Comment: stopped 30 years ago    Drug use: No   Sexual activity: Not Currently  Other Topics Concern   Not on file  Social History Narrative   Not on file   Social Determinants of Health   Financial Resource Strain: Not on file  Food Insecurity: Not on file  Transportation Needs: Not on file  Physical Activity: Not on file  Stress: Not on file  Social Connections: Not on file    Review of Systems  Constitutional: Negative.   Gastrointestinal: Negative.   Musculoskeletal:  Positive for back pain.    Physical Exam No direct physical exam was performed   Vital Signs: There were no vitals taken for this visit.  Imaging: IR Angiogram Visceral Selective  Result Date:  07/24/2022 INDICATION: 76 year old with multifocal hepatocellular carcinoma and increasing AFP  level. Large tumor burden involving the left hepatic lobe. Majority of the left hepatic lesions were being supplied by an accessory left hepatic artery that was previously treated with bland embolization. Attempted second embolization of the accessory left hepatic artery on 06/19/2022 was unsuccessful due to vasospasm and possible dissection during the previous procedure. Patient presents for repeat imaging and repeat bland embolization of the left hepatic arteries. EXAM: 1. Visceral arteriography: Celiac trunk, left gastric artery, common hepatic artery, medial left hepatic artery and lateral left hepatic artery 2. Bland embolization of left hepatic arteries 3. Ultrasound guidance for vascular access MEDICATIONS: Mefoxin 2 g, Decadron 4 mg ANESTHESIA/SEDATION: Moderate (conscious) sedation was employed during this procedure. A total of Versed 2.0 mg and Fentanyl 100 mcg was administered intravenously by the radiology nurse. Total intra-service moderate Sedation Time: 94 minutes. The patient's level of consciousness and vital signs were monitored continuously by radiology nursing throughout the procedure under my direct supervision. CONTRAST:  22 mL Visipaque 320 FLUOROSCOPY: Radiation Exposure Index (as provided by the fluoroscopic device): 5176 mGy Kerma COMPLICATIONS: None immediate. PROCEDURE: The procedure was explained to the patient. The risks and benefits of the procedure were discussed and the patient's questions were addressed. Informed consent was obtained from the patient. Time-out was performed. Patient was placed supine on the interventional table. Right groin was prepped and draped in sterile fashion. Maximal barrier sterile technique was utilized including caps, mask, sterile gowns, sterile gloves, sterile drape, hand hygiene and skin antiseptic. Ultrasound confirmed a patent right common femoral artery.  Ultrasound was saved for documentation. Skin was anesthetized with 1% lidocaine. 21 gauge needle directed into the right common femoral artery with ultrasound guidance. Micropuncture dilator set was placed. Access was upsized to a 5 [place] vascular sheath over a Bentson wire. Mikaelsson catheter was formed in the thoracic aorta and used to cannulate the celiac artery with a Bentson wire. Lateral angiogram within the celiac trunk was performed in order to identify the left gastric artery. Left gastric artery was successfully cannulated using a J directional microcatheter and a Synchro microwire. Microcatheter was advanced into the left gastric artery and selective angiography was performed. Accessory left hepatic artery was no longer patent coming off the left gastric artery. Therefore, the J directional microcatheter was removed and exchanged for high-flow Renegade catheter. High-flow Renegade catheter was advanced to the common hepatic artery contrast injection was used to identify the left and right hepatic arteries. The left hepatic artery was selected and angiography was performed in both the medial and lateral main left hepatic arteries. Microcatheter was advanced into the lateral artery using a double angle GT Glidewire. Selective angiography was performed. This vessel was embolized using 100-300 micron Embospheres until there was near stasis. Catheter was then advanced into the medial left hepatic artery branch and this vessel was embolized using 100-300 micron Embospheres. One vial of the 100-300 micron Embospheres was used for this procedure. There was near stasis in both vessels at the end of the procedure. Microcatheter was removed. Mikaelsson was removed over a Bentson wire. Contrast was injected through the right groin sheath. The right groin sheath was removed using the Angio-Seal closure device. Right groin hemostasis at the end of the procedure. Bandage placed at the right groin. FINDINGS: Left  gastric artery was successfully cannulated. There has been a significant change since the prior arteriogram. Accessory left hepatic artery is no longer patent off the left gastric artery. Main branch of the left gastric artery is very diminutive with slow  flow. Suspect that there was occlusion or dissection associated with the previous catheterization. Small left gastric artery branches are patent. Common hepatic artery is patent. Left and right hepatic arteries are patent. Selective catheterization of the 2 main left hepatic artery branches. The lateral left hepatic artery branch supplying segments 2 and 3 demonstrates some collateral flow to the accessory left hepatic artery. This vessel was successfully embolized using particles. There was near complete stasis in this vessel at the end of the procedure. Residual filling of the accessory left hepatic artery through a small collateral branch near the proximal aspect of the hepatic artery branch. Near complete stasis in the medial left hepatic artery branch supplying segment 4 following particle embolization. IMPRESSION: 1. Successful bland embolization of the main left hepatic arteries. Two main branches of the left hepatic artery were both treated. 2. Accessory left hepatic artery is no longer patent coming off the left gastric artery and there is also slow flow in the main branch of the left gastric artery. Electronically Signed   By: Markus Daft M.D.   On: 07/24/2022 10:54   IR EMBO TUMOR ORGAN ISCHEMIA INFARCT INC GUIDE ROADMAPPING  Result Date: 07/22/2022 INDICATION: 76 year old with multifocal hepatocellular carcinoma and increasing AFP level. Large tumor burden involving the left hepatic lobe. Majority of the left hepatic lesions were being supplied by an accessory left hepatic artery that was previously treated with bland embolization. Attempted second embolization of the accessory left hepatic artery on 06/19/2022 was unsuccessful due to vasospasm and  possible dissection during the previous procedure. Patient presents for repeat imaging and repeat bland embolization of the left hepatic arteries. EXAM: 1. Visceral arteriography: Celiac trunk, left gastric artery, common hepatic artery, medial left hepatic artery and lateral left hepatic artery 2. Bland embolization of left hepatic arteries 3. Ultrasound guidance for vascular access MEDICATIONS: Mefoxin 2 g, Decadron 4 mg ANESTHESIA/SEDATION: Moderate (conscious) sedation was employed during this procedure. A total of Versed 2.0 mg and Fentanyl 100 mcg was administered intravenously by the radiology nurse. Total intra-service moderate Sedation Time: 94 minutes. The patient's level of consciousness and vital signs were monitored continuously by radiology nursing throughout the procedure under my direct supervision. CONTRAST:  22 mL Visipaque 320 FLUOROSCOPY: Radiation Exposure Index (as provided by the fluoroscopic device): 7341 mGy Kerma COMPLICATIONS: None immediate. PROCEDURE: The procedure was explained to the patient. The risks and benefits of the procedure were discussed and the patient's questions were addressed. Informed consent was obtained from the patient. Time-out was performed. Patient was placed supine on the interventional table. Right groin was prepped and draped in sterile fashion. Maximal barrier sterile technique was utilized including caps, mask, sterile gowns, sterile gloves, sterile drape, hand hygiene and skin antiseptic. Ultrasound confirmed a patent right common femoral artery. Ultrasound was saved for documentation. Skin was anesthetized with 1% lidocaine. 21 gauge needle directed into the right common femoral artery with ultrasound guidance. Micropuncture dilator set was placed. Access was upsized to a 5 [place] vascular sheath over a Bentson wire. Mikaelsson catheter was formed in the thoracic aorta and used to cannulate the celiac artery with a Bentson wire. Lateral angiogram within the  celiac trunk was performed in order to identify the left gastric artery. Left gastric artery was successfully cannulated using a J directional microcatheter and a Synchro microwire. Microcatheter was advanced into the left gastric artery and selective angiography was performed. Accessory left hepatic artery was no longer patent coming off the left gastric artery. Therefore, the J directional  microcatheter was removed and exchanged for high-flow Renegade catheter. High-flow Renegade catheter was advanced to the common hepatic artery contrast injection was used to identify the left and right hepatic arteries. The left hepatic artery was selected and angiography was performed in both the medial and lateral main left hepatic arteries. Microcatheter was advanced into the lateral artery using a double angle GT Glidewire. Selective angiography was performed. This vessel was embolized using 100-300 micron Embospheres until there was near stasis. Catheter was then advanced into the medial left hepatic artery branch and this vessel was embolized using 100-300 micron Embospheres. One vial of the 100-300 micron Embospheres was used for this procedure. There was near stasis in both vessels at the end of the procedure. Microcatheter was removed. Mikaelsson was removed over a Bentson wire. Contrast was injected through the right groin sheath. The right groin sheath was removed using the Angio-Seal closure device. Right groin hemostasis at the end of the procedure. Bandage placed at the right groin. FINDINGS: Left gastric artery was successfully cannulated. There has been a significant change since the prior arteriogram. Accessory left hepatic artery is no longer patent off the left gastric artery. Main branch of the left gastric artery is very diminutive with slow flow. Suspect that there was occlusion or dissection associated with the previous catheterization. Small left gastric artery branches are patent. Common hepatic artery  is patent. Left and right hepatic arteries are patent. Selective catheterization of the 2 main left hepatic artery branches. The lateral left hepatic artery branch supplying segments 2 and 3 demonstrates some collateral flow to the accessory left hepatic artery. This vessel was successfully embolized using particles. There was near complete stasis in this vessel at the end of the procedure. Residual filling of the accessory left hepatic artery through a small collateral branch near the proximal aspect of the hepatic artery branch. Near complete stasis in the medial left hepatic artery branch supplying segment 4 following particle embolization. IMPRESSION: 1. Successful bland embolization of the main left hepatic arteries. Two main branches of the left hepatic artery were both treated. 2. Accessory left hepatic artery is no longer patent coming off the left gastric artery and there is also slow flow in the main branch of the left gastric artery. Electronically Signed   By: Markus Daft M.D.   On: 07/22/2022 16:03   IR US Guide Vasc Access Right  Result Date: 07/22/2022 INDICATION: 76 year old with multifocal hepatocellular carcinoma and increasing AFP level. Large tumor burden involving the left hepatic lobe. Majority of the left hepatic lesions were being supplied by an accessory left hepatic artery that was previously treated with bland embolization. Attempted second embolization of the accessory left hepatic artery on 06/19/2022 was unsuccessful due to vasospasm and possible dissection during the previous procedure. Patient presents for repeat imaging and repeat bland embolization of the left hepatic arteries. EXAM: 1. Visceral arteriography: Celiac trunk, left gastric artery, common hepatic artery, medial left hepatic artery and lateral left hepatic artery 2. Bland embolization of left hepatic arteries 3. Ultrasound guidance for vascular access MEDICATIONS: Mefoxin 2 g, Decadron 4 mg ANESTHESIA/SEDATION:  Moderate (conscious) sedation was employed during this procedure. A total of Versed 2.0 mg and Fentanyl 100 mcg was administered intravenously by the radiology nurse. Total intra-service moderate Sedation Time: 94 minutes. The patient's level of consciousness and vital signs were monitored continuously by radiology nursing throughout the procedure under my direct supervision. CONTRAST:  22 mL Visipaque 320 FLUOROSCOPY: Radiation Exposure Index (as provided by the  fluoroscopic device): 4196 mGy Kerma COMPLICATIONS: None immediate. PROCEDURE: The procedure was explained to the patient. The risks and benefits of the procedure were discussed and the patient's questions were addressed. Informed consent was obtained from the patient. Time-out was performed. Patient was placed supine on the interventional table. Right groin was prepped and draped in sterile fashion. Maximal barrier sterile technique was utilized including caps, mask, sterile gowns, sterile gloves, sterile drape, hand hygiene and skin antiseptic. Ultrasound confirmed a patent right common femoral artery. Ultrasound was saved for documentation. Skin was anesthetized with 1% lidocaine. 21 gauge needle directed into the right common femoral artery with ultrasound guidance. Micropuncture dilator set was placed. Access was upsized to a 5 [place] vascular sheath over a Bentson wire. Mikaelsson catheter was formed in the thoracic aorta and used to cannulate the celiac artery with a Bentson wire. Lateral angiogram within the celiac trunk was performed in order to identify the left gastric artery. Left gastric artery was successfully cannulated using a J directional microcatheter and a Synchro microwire. Microcatheter was advanced into the left gastric artery and selective angiography was performed. Accessory left hepatic artery was no longer patent coming off the left gastric artery. Therefore, the J directional microcatheter was removed and exchanged for high-flow  Renegade catheter. High-flow Renegade catheter was advanced to the common hepatic artery contrast injection was used to identify the left and right hepatic arteries. The left hepatic artery was selected and angiography was performed in both the medial and lateral main left hepatic arteries. Microcatheter was advanced into the lateral artery using a double angle GT Glidewire. Selective angiography was performed. This vessel was embolized using 100-300 micron Embospheres until there was near stasis. Catheter was then advanced into the medial left hepatic artery branch and this vessel was embolized using 100-300 micron Embospheres. One vial of the 100-300 micron Embospheres was used for this procedure. There was near stasis in both vessels at the end of the procedure. Microcatheter was removed. Mikaelsson was removed over a Bentson wire. Contrast was injected through the right groin sheath. The right groin sheath was removed using the Angio-Seal closure device. Right groin hemostasis at the end of the procedure. Bandage placed at the right groin. FINDINGS: Left gastric artery was successfully cannulated. There has been a significant change since the prior arteriogram. Accessory left hepatic artery is no longer patent off the left gastric artery. Main branch of the left gastric artery is very diminutive with slow flow. Suspect that there was occlusion or dissection associated with the previous catheterization. Small left gastric artery branches are patent. Common hepatic artery is patent. Left and right hepatic arteries are patent. Selective catheterization of the 2 main left hepatic artery branches. The lateral left hepatic artery branch supplying segments 2 and 3 demonstrates some collateral flow to the accessory left hepatic artery. This vessel was successfully embolized using particles. There was near complete stasis in this vessel at the end of the procedure. Residual filling of the accessory left hepatic artery  through a small collateral branch near the proximal aspect of the hepatic artery branch. Near complete stasis in the medial left hepatic artery branch supplying segment 4 following particle embolization. IMPRESSION: 1. Successful bland embolization of the main left hepatic arteries. Two main branches of the left hepatic artery were both treated. 2. Accessory left hepatic artery is no longer patent coming off the left gastric artery and there is also slow flow in the main branch of the left gastric artery. Electronically Signed  By: Markus Daft M.D.   On: 07/22/2022 16:03   IR Angiogram Selective Each Additional Vessel  Result Date: 07/22/2022 INDICATION: 76 year old with multifocal hepatocellular carcinoma and increasing AFP level. Large tumor burden involving the left hepatic lobe. Majority of the left hepatic lesions were being supplied by an accessory left hepatic artery that was previously treated with bland embolization. Attempted second embolization of the accessory left hepatic artery on 06/19/2022 was unsuccessful due to vasospasm and possible dissection during the previous procedure. Patient presents for repeat imaging and repeat bland embolization of the left hepatic arteries. EXAM: 1. Visceral arteriography: Celiac trunk, left gastric artery, common hepatic artery, medial left hepatic artery and lateral left hepatic artery 2. Bland embolization of left hepatic arteries 3. Ultrasound guidance for vascular access MEDICATIONS: Mefoxin 2 g, Decadron 4 mg ANESTHESIA/SEDATION: Moderate (conscious) sedation was employed during this procedure. A total of Versed 2.0 mg and Fentanyl 100 mcg was administered intravenously by the radiology nurse. Total intra-service moderate Sedation Time: 94 minutes. The patient's level of consciousness and vital signs were monitored continuously by radiology nursing throughout the procedure under my direct supervision. CONTRAST:  22 mL Visipaque 320 FLUOROSCOPY: Radiation  Exposure Index (as provided by the fluoroscopic device): 8469 mGy Kerma COMPLICATIONS: None immediate. PROCEDURE: The procedure was explained to the patient. The risks and benefits of the procedure were discussed and the patient's questions were addressed. Informed consent was obtained from the patient. Time-out was performed. Patient was placed supine on the interventional table. Right groin was prepped and draped in sterile fashion. Maximal barrier sterile technique was utilized including caps, mask, sterile gowns, sterile gloves, sterile drape, hand hygiene and skin antiseptic. Ultrasound confirmed a patent right common femoral artery. Ultrasound was saved for documentation. Skin was anesthetized with 1% lidocaine. 21 gauge needle directed into the right common femoral artery with ultrasound guidance. Micropuncture dilator set was placed. Access was upsized to a 5 [place] vascular sheath over a Bentson wire. Mikaelsson catheter was formed in the thoracic aorta and used to cannulate the celiac artery with a Bentson wire. Lateral angiogram within the celiac trunk was performed in order to identify the left gastric artery. Left gastric artery was successfully cannulated using a J directional microcatheter and a Synchro microwire. Microcatheter was advanced into the left gastric artery and selective angiography was performed. Accessory left hepatic artery was no longer patent coming off the left gastric artery. Therefore, the J directional microcatheter was removed and exchanged for high-flow Renegade catheter. High-flow Renegade catheter was advanced to the common hepatic artery contrast injection was used to identify the left and right hepatic arteries. The left hepatic artery was selected and angiography was performed in both the medial and lateral main left hepatic arteries. Microcatheter was advanced into the lateral artery using a double angle GT Glidewire. Selective angiography was performed. This vessel was  embolized using 100-300 micron Embospheres until there was near stasis. Catheter was then advanced into the medial left hepatic artery branch and this vessel was embolized using 100-300 micron Embospheres. One vial of the 100-300 micron Embospheres was used for this procedure. There was near stasis in both vessels at the end of the procedure. Microcatheter was removed. Mikaelsson was removed over a Bentson wire. Contrast was injected through the right groin sheath. The right groin sheath was removed using the Angio-Seal closure device. Right groin hemostasis at the end of the procedure. Bandage placed at the right groin. FINDINGS: Left gastric artery was successfully cannulated. There has been a  significant change since the prior arteriogram. Accessory left hepatic artery is no longer patent off the left gastric artery. Main branch of the left gastric artery is very diminutive with slow flow. Suspect that there was occlusion or dissection associated with the previous catheterization. Small left gastric artery branches are patent. Common hepatic artery is patent. Left and right hepatic arteries are patent. Selective catheterization of the 2 main left hepatic artery branches. The lateral left hepatic artery branch supplying segments 2 and 3 demonstrates some collateral flow to the accessory left hepatic artery. This vessel was successfully embolized using particles. There was near complete stasis in this vessel at the end of the procedure. Residual filling of the accessory left hepatic artery through a small collateral branch near the proximal aspect of the hepatic artery branch. Near complete stasis in the medial left hepatic artery branch supplying segment 4 following particle embolization. IMPRESSION: 1. Successful bland embolization of the main left hepatic arteries. Two main branches of the left hepatic artery were both treated. 2. Accessory left hepatic artery is no longer patent coming off the left gastric  artery and there is also slow flow in the main branch of the left gastric artery. Electronically Signed   By: Markus Daft M.D.   On: 07/22/2022 16:03   IR Angiogram Selective Each Additional Vessel  Result Date: 07/22/2022 INDICATION: 76 year old with multifocal hepatocellular carcinoma and increasing AFP level. Large tumor burden involving the left hepatic lobe. Majority of the left hepatic lesions were being supplied by an accessory left hepatic artery that was previously treated with bland embolization. Attempted second embolization of the accessory left hepatic artery on 06/19/2022 was unsuccessful due to vasospasm and possible dissection during the previous procedure. Patient presents for repeat imaging and repeat bland embolization of the left hepatic arteries. EXAM: 1. Visceral arteriography: Celiac trunk, left gastric artery, common hepatic artery, medial left hepatic artery and lateral left hepatic artery 2. Bland embolization of left hepatic arteries 3. Ultrasound guidance for vascular access MEDICATIONS: Mefoxin 2 g, Decadron 4 mg ANESTHESIA/SEDATION: Moderate (conscious) sedation was employed during this procedure. A total of Versed 2.0 mg and Fentanyl 100 mcg was administered intravenously by the radiology nurse. Total intra-service moderate Sedation Time: 94 minutes. The patient's level of consciousness and vital signs were monitored continuously by radiology nursing throughout the procedure under my direct supervision. CONTRAST:  22 mL Visipaque 320 FLUOROSCOPY: Radiation Exposure Index (as provided by the fluoroscopic device): 6629 mGy Kerma COMPLICATIONS: None immediate. PROCEDURE: The procedure was explained to the patient. The risks and benefits of the procedure were discussed and the patient's questions were addressed. Informed consent was obtained from the patient. Time-out was performed. Patient was placed supine on the interventional table. Right groin was prepped and draped in sterile  fashion. Maximal barrier sterile technique was utilized including caps, mask, sterile gowns, sterile gloves, sterile drape, hand hygiene and skin antiseptic. Ultrasound confirmed a patent right common femoral artery. Ultrasound was saved for documentation. Skin was anesthetized with 1% lidocaine. 21 gauge needle directed into the right common femoral artery with ultrasound guidance. Micropuncture dilator set was placed. Access was upsized to a 5 [place] vascular sheath over a Bentson wire. Mikaelsson catheter was formed in the thoracic aorta and used to cannulate the celiac artery with a Bentson wire. Lateral angiogram within the celiac trunk was performed in order to identify the left gastric artery. Left gastric artery was successfully cannulated using a J directional microcatheter and a Synchro microwire. Microcatheter was advanced  into the left gastric artery and selective angiography was performed. Accessory left hepatic artery was no longer patent coming off the left gastric artery. Therefore, the J directional microcatheter was removed and exchanged for high-flow Renegade catheter. High-flow Renegade catheter was advanced to the common hepatic artery contrast injection was used to identify the left and right hepatic arteries. The left hepatic artery was selected and angiography was performed in both the medial and lateral main left hepatic arteries. Microcatheter was advanced into the lateral artery using a double angle GT Glidewire. Selective angiography was performed. This vessel was embolized using 100-300 micron Embospheres until there was near stasis. Catheter was then advanced into the medial left hepatic artery branch and this vessel was embolized using 100-300 micron Embospheres. One vial of the 100-300 micron Embospheres was used for this procedure. There was near stasis in both vessels at the end of the procedure. Microcatheter was removed. Mikaelsson was removed over a Bentson wire. Contrast was  injected through the right groin sheath. The right groin sheath was removed using the Angio-Seal closure device. Right groin hemostasis at the end of the procedure. Bandage placed at the right groin. FINDINGS: Left gastric artery was successfully cannulated. There has been a significant change since the prior arteriogram. Accessory left hepatic artery is no longer patent off the left gastric artery. Main branch of the left gastric artery is very diminutive with slow flow. Suspect that there was occlusion or dissection associated with the previous catheterization. Small left gastric artery branches are patent. Common hepatic artery is patent. Left and right hepatic arteries are patent. Selective catheterization of the 2 main left hepatic artery branches. The lateral left hepatic artery branch supplying segments 2 and 3 demonstrates some collateral flow to the accessory left hepatic artery. This vessel was successfully embolized using particles. There was near complete stasis in this vessel at the end of the procedure. Residual filling of the accessory left hepatic artery through a small collateral branch near the proximal aspect of the hepatic artery branch. Near complete stasis in the medial left hepatic artery branch supplying segment 4 following particle embolization. IMPRESSION: 1. Successful bland embolization of the main left hepatic arteries. Two main branches of the left hepatic artery were both treated. 2. Accessory left hepatic artery is no longer patent coming off the left gastric artery and there is also slow flow in the main branch of the left gastric artery. Electronically Signed   By: Markus Daft M.D.   On: 07/22/2022 16:03    Labs:  CBC: Recent Labs    04/14/22 1223 06/19/22 0800 07/03/22 0813 07/22/22 0812  WBC 5.0 5.7 6.6 5.3  HGB 12.1* 11.7* 12.8* 11.9*  HCT 38.3* 38.8* 41.3 40.2  PLT 160 159 279 218    COAGS: Recent Labs    01/23/22 0808 02/05/22 0811 06/19/22 0800  07/22/22 0812  INR 1.0 1.0 1.0 1.1    BMP: Recent Labs    04/14/22 1223 06/19/22 0800 07/03/22 0813 07/22/22 0812  NA 138 139 140 140  K 4.2 4.7 4.8 4.4  CL 109 108 104 111  CO2 '24 23 28 '$ 21*  GLUCOSE 97 112* 107* 100*  BUN 40* 32* 35* 32*  CALCIUM 9.3 8.8* 9.5 8.6*  CREATININE 2.59* 2.04* 2.40* 2.18*  GFRNONAA 25* 33* 27* 31*    LIVER FUNCTION TESTS: Recent Labs    04/14/22 1223 06/19/22 0800 07/03/22 0813 07/22/22 0812  BILITOT 0.5 0.9 0.6 0.5  AST 34 43* 33 33  ALT  $'26 24 27 22  'S$ ALKPHOS 96 78 143* 109  PROT 7.7 7.5 8.6* 8.1  ALBUMIN 4.1 3.6 3.7 3.5    TUMOR MARKERS: No results for input(s): "AFPTM", "CEA", "CA199", "CHROMGRNA" in the last 8760 hours.  Assessment and Plan:  76 year old with multifocal hepatocellular carcinoma.  Patient was recently treated with bland embolization to the left hepatic arteries on 07/22/2022.  From a technical standpoint, the accessory left hepatic artery that was supplying tumors is now occluded and the hepatic tumors were treated via the main left hepatic arteries.  Aside from the patient's back pain, he tolerated the most recent bland embolization procedure well.  He currently has no complaints or new issues.  Patient will need continued close surveillance.  His last MRI was in August 2023.  Would like to wait a few months after the bland embolization to evaluate for the treatment effects.  Will plan for follow-up labs in January including AFP.  Patient continues to follow with Dr. Irene Limbo and currently taking lenvatinib.   Thank you for this interesting consult.  I greatly enjoyed meeting Aaron [name] and look forward to participating in their care.  A copy of this report was sent to the requesting provider on this date.  Electronically Signed: Burman Riis 08/06/2022, 8:57 AM   I spent a total of 10 minutes in remote clinical consultation, greater than 50% of which was counseling/coordinating care for hepatocellular carcinoma.     Visit type: Audio only (telephone). Audio (no video) only due to technical limitations. Alternative for in-person consultation at [facility], [address], [place], [place]. This visit type was conducted due to national recommendations for restrictions regarding the COVID-19 Pandemic (e.g. social distancing).  This format is felt to be most appropriate for this patient at this time.  All issues noted in this document were discussed and addressed.  Patient ID: Aaron [name], male   DOB: 06-11-1946, 76 y.o.   MRN: [id number]

## 2022-08-07 ENCOUNTER — Other Ambulatory Visit: Payer: Self-pay

## 2022-08-10 ENCOUNTER — Other Ambulatory Visit (HOSPITAL_COMMUNITY): Payer: Self-pay

## 2022-08-10 NOTE — Progress Notes (Signed)
This encounter was created in error - please disregard.

## 2022-08-13 DIAGNOSIS — Z0000 Encounter for general adult medical examination without abnormal findings: Secondary | ICD-10-CM | POA: Diagnosis not present

## 2022-08-13 DIAGNOSIS — E1149 Type 2 diabetes mellitus with other diabetic neurological complication: Secondary | ICD-10-CM | POA: Diagnosis not present

## 2022-08-13 DIAGNOSIS — N184 Chronic kidney disease, stage 4 (severe): Secondary | ICD-10-CM | POA: Diagnosis not present

## 2022-08-13 DIAGNOSIS — I10 Essential (primary) hypertension: Secondary | ICD-10-CM | POA: Diagnosis not present

## 2022-08-13 DIAGNOSIS — E7800 Pure hypercholesterolemia, unspecified: Secondary | ICD-10-CM | POA: Diagnosis not present

## 2022-08-20 DIAGNOSIS — E1142 Type 2 diabetes mellitus with diabetic polyneuropathy: Secondary | ICD-10-CM | POA: Diagnosis not present

## 2022-08-20 DIAGNOSIS — Z0000 Encounter for general adult medical examination without abnormal findings: Secondary | ICD-10-CM | POA: Diagnosis not present

## 2022-08-20 DIAGNOSIS — Z23 Encounter for immunization: Secondary | ICD-10-CM | POA: Diagnosis not present

## 2022-08-20 DIAGNOSIS — N390 Urinary tract infection, site not specified: Secondary | ICD-10-CM | POA: Diagnosis not present

## 2022-08-20 DIAGNOSIS — E1149 Type 2 diabetes mellitus with other diabetic neurological complication: Secondary | ICD-10-CM | POA: Diagnosis not present

## 2022-08-20 DIAGNOSIS — D472 Monoclonal gammopathy: Secondary | ICD-10-CM | POA: Diagnosis not present

## 2022-08-20 DIAGNOSIS — C220 Liver cell carcinoma: Secondary | ICD-10-CM | POA: Diagnosis not present

## 2022-08-20 DIAGNOSIS — I10 Essential (primary) hypertension: Secondary | ICD-10-CM | POA: Diagnosis not present

## 2022-08-20 DIAGNOSIS — N184 Chronic kidney disease, stage 4 (severe): Secondary | ICD-10-CM | POA: Diagnosis not present

## 2022-08-25 ENCOUNTER — Telehealth: Payer: Self-pay | Admitting: Hematology

## 2022-08-25 NOTE — Telephone Encounter (Signed)
Rescheduled appointment per 12/29 scheduling message. Patient is aware.

## 2022-08-27 ENCOUNTER — Other Ambulatory Visit (HOSPITAL_COMMUNITY): Payer: Self-pay

## 2022-08-31 ENCOUNTER — Telehealth: Payer: Self-pay | Admitting: Pharmacy Technician

## 2022-08-31 ENCOUNTER — Other Ambulatory Visit (HOSPITAL_COMMUNITY): Payer: Self-pay

## 2022-08-31 ENCOUNTER — Other Ambulatory Visit: Payer: Self-pay

## 2022-08-31 NOTE — Telephone Encounter (Signed)
Oral Oncology Patient Advocate Encounter   Was successful in securing patient a $7,500 grant from [employer] to provide copayment coverage for [place].  This will keep the out of pocket expense at $0.     I have spoken with the patient.    The billing information is as follows and has been shared with [place].   Member ID: [id number] Group ID: [id number] RxBin: [id number] PCN: [id number] Dates of Eligibility: 08/31/22 through 09/01/23  Fund name:  Hepatocellular.   Lady Deutscher, CPhT-Adv Oncology Pharmacy Patient [place] Direct Number: [phone]  Fax: [phone]

## 2022-09-01 ENCOUNTER — Other Ambulatory Visit: Payer: Self-pay

## 2022-09-03 ENCOUNTER — Other Ambulatory Visit: Payer: Self-pay | Admitting: Diagnostic Radiology

## 2022-09-03 DIAGNOSIS — C220 Liver cell carcinoma: Secondary | ICD-10-CM

## 2022-09-14 ENCOUNTER — Inpatient Hospital Stay (HOSPITAL_BASED_OUTPATIENT_CLINIC_OR_DEPARTMENT_OTHER): Payer: Medicare HMO | Admitting: Hematology

## 2022-09-14 ENCOUNTER — Other Ambulatory Visit: Payer: Self-pay

## 2022-09-14 ENCOUNTER — Inpatient Hospital Stay: Payer: Medicare HMO | Attending: Hematology

## 2022-09-14 VITALS — BP 127/91 | HR 96 | Temp 97.7°F | Resp 20 | Wt 270.2 lb

## 2022-09-14 DIAGNOSIS — Z8505 Personal history of malignant neoplasm of liver: Secondary | ICD-10-CM | POA: Diagnosis not present

## 2022-09-14 DIAGNOSIS — D472 Monoclonal gammopathy: Secondary | ICD-10-CM | POA: Insufficient documentation

## 2022-09-14 DIAGNOSIS — C220 Liver cell carcinoma: Secondary | ICD-10-CM

## 2022-09-14 DIAGNOSIS — B1920 Unspecified viral hepatitis C without hepatic coma: Secondary | ICD-10-CM | POA: Diagnosis not present

## 2022-09-14 DIAGNOSIS — Z08 Encounter for follow-up examination after completed treatment for malignant neoplasm: Secondary | ICD-10-CM | POA: Diagnosis not present

## 2022-09-14 DIAGNOSIS — D509 Iron deficiency anemia, unspecified: Secondary | ICD-10-CM | POA: Diagnosis not present

## 2022-09-14 LAB — CBC WITH DIFFERENTIAL (CANCER CENTER ONLY)
Abs Immature Granulocytes: 0.01 10*3/uL (ref 0.00–0.07)
Basophils Absolute: 0 10*3/uL (ref 0.0–0.1)
Basophils Relative: 1 %
Eosinophils Absolute: 0.2 10*3/uL (ref 0.0–0.5)
Eosinophils Relative: 4 %
HCT: 39.9 % (ref 39.0–52.0)
Hemoglobin: 12.2 g/dL — ABNORMAL LOW (ref 13.0–17.0)
Immature Granulocytes: 0 %
Lymphocytes Relative: 26 %
Lymphs Abs: 1.3 10*3/uL (ref 0.7–4.0)
MCH: 23.1 pg — ABNORMAL LOW (ref 26.0–34.0)
MCHC: 30.6 g/dL (ref 30.0–36.0)
MCV: 75.6 fL — ABNORMAL LOW (ref 80.0–100.0)
Monocytes Absolute: 0.5 10*3/uL (ref 0.1–1.0)
Monocytes Relative: 9 %
Neutro Abs: 3 10*3/uL (ref 1.7–7.7)
Neutrophils Relative %: 60 %
Platelet Count: 182 10*3/uL (ref 150–400)
RBC: 5.28 MIL/uL (ref 4.22–5.81)
RDW: 17 % — ABNORMAL HIGH (ref 11.5–15.5)
WBC Count: 5 10*3/uL (ref 4.0–10.5)
nRBC: 0 % (ref 0.0–0.2)

## 2022-09-14 LAB — CMP (CANCER CENTER ONLY)
ALT: 20 U/L (ref 0–44)
AST: 41 U/L (ref 15–41)
Albumin: 3.7 g/dL (ref 3.5–5.0)
Alkaline Phosphatase: 97 U/L (ref 38–126)
Anion gap: 8 (ref 5–15)
BUN: 35 mg/dL — ABNORMAL HIGH (ref 8–23)
CO2: 23 mmol/L (ref 22–32)
Calcium: 9.4 mg/dL (ref 8.9–10.3)
Chloride: 107 mmol/L (ref 98–111)
Creatinine: 2.26 mg/dL — ABNORMAL HIGH (ref 0.61–1.24)
GFR, Estimated: 30 mL/min — ABNORMAL LOW (ref >60)
Glucose, Bld: 103 mg/dL — ABNORMAL HIGH (ref 70–99)
Potassium: 4.4 mmol/L (ref 3.5–5.1)
Sodium: 138 mmol/L (ref 135–145)
Total Bilirubin: 0.5 mg/dL (ref 0.3–1.2)
Total Protein: 7.6 g/dL (ref 6.5–8.1)

## 2022-09-14 LAB — MAGNESIUM: Magnesium: 1.6 mg/dL — ABNORMAL LOW (ref 1.7–2.4)

## 2022-09-14 NOTE — Progress Notes (Signed)
HEMATOLOGY/ONCOLOGY CLINIC NOTE  Date of Service: 09/14/22  Patient Care Team: [name], DO as PCP - General (Family Medicine)  CHIEF COMPLAINTS/PURPOSE OF CONSULTATION:  Follow-up for continued evaluation and management of hepatocellular carcinoma.  HISTORY OF PRESENTING ILLNESS:  Please see previous note for details on initial presentation  INTERVAL HISTORY:  Aaron [name] is a 77 y.o. male here for follow-up for [facility].  Patient was last seen by me on 07/03/2022 and he was doing well overall. He noted that his bland embolization of the accessory left artery was rescheduled to 07/22/2022 due to significant vasospasm and poor flow in the accessory left artery.   Patient reports he is doing well without any new medical concerns since our last visit. He denies fever, chills, night sweats, abdominal pain, chest pain, back pain, abnormal bowel movement, urination problem, or leg swelling.  He complains of consistent left leg pain since our last visit. He notes that the pain is around side of his left leg. He denies recent fall.  He has been eating well and staying well-hydrated.   Patient regularly takes Amlodipine 10 mg without any toxicities.   MEDICAL HISTORY:  Past Medical History:  Diagnosis Date   Arthritis    Diabetes mellitus without complication ([place])    type II    Dyspnea    Elevated liver enzymes    Fatty liver    Hepatitis C    genotype 1A status post treatment with Linzie Collin and Ribavarin for 24 weeks   Hepatocellular carcinoma ([place]) 01/30/2014   Path   History of colon polyps    Hyperlipidemia    Hypertension    Iron deficiency anemia    hx of    MGUS (monoclonal gammopathy of unknown significance)    Obesity Hepatocellular carcinoma treated with TACE and percutaneous thermal ablation by interventional radiology. Last MRI on 08/06/2015 showed slight decrease in the size of the ablation defect involving the right lobe of the liver area and no  findings to suggest residual or recurrent hepatocellular carcinoma. Mild changes of liver cirrhosis. MRI Abd 06/24/2016- no evidence of recurrent HCC   Hepatitis C genotype 1A status post treatment with Viekira and Ribavarin for 24 weeks.  Monoclonal gammopathy of undetermined significance.  SURGICAL HISTORY:  Status post microwave ablation[October 2015] of liver lesion and TACE [July 2015] for [place]. EGD and colonoscopy in 2016   SOCIAL HISTORY: Social History   Socioeconomic History   Marital status: Married    Spouse name: Not on file   Number of children: Not on file   Years of education: Not on file   Highest education level: Not on file  Occupational History   Not on file  Tobacco Use   Smoking status: Former    Types: Cigarettes    Quit date: 01/30/1994    Years since quitting: 28.6   Smokeless tobacco: Never   Tobacco comments:    stopped 25 yrs ago  Vaping Use   Vaping Use: Never used  Substance and Sexual Activity   Alcohol use: No    Comment: stopped 30 years ago    Drug use: No   Sexual activity: Not Currently  Other Topics Concern   Not on file  Social History Narrative   Not on file   Social Determinants of Health   Financial Resource Strain: Not on file  Food Insecurity: Not on file  Transportation Needs: Not on file  Physical Activity: Not on file  Stress: Not on file  Social Connections: Not on file  Intimate Partner Violence: Not on file  Former smoker and smoked 1 pack per day for about 20 years starting at age 61 quit 31 years ago.  FAMILY HISTORY: No family history on file.  ALLERGIES:  has No Known Allergies.  MEDICATIONS:  Current Outpatient Medications  Medication Sig Dispense Refill   ACCU-CHEK AVIVA PLUS test strip      Accu-Chek Softclix Lancets lancets      amLODipine (NORVASC) 10 MG tablet TAKE 1 TABLET BY MOUTH EVERY DAY 90 tablet 1   aspirin EC 81 MG tablet Take 81 mg by mouth every morning.      cholecalciferol (VITAMIN D)  1000 UNITS tablet Take 1,000 Units by mouth every morning.      glipiZIDE (GLUCOTROL XL) 2.5 MG 24 hr tablet Take 2.5 mg by mouth daily with breakfast.     Insulin Glargine (LANTUS SOLOSTAR) 100 UNIT/ML Solostar Pen Inject 32 Units into the skin daily.     lenvatinib 12 mg daily dose (LENVIMA) 3 x 4 MG capsule TAKE 3 CAPSULES ('12MG'$ ) BY MOUTH DAILY. 90 each 2   Metoprolol Succinate 50 MG CS24 1 capsule     Multiple Vitamin (MULTIVITAMIN WITH MINERALS) TABS tablet Take 1 tablet daily by mouth.     oxyCODONE (OXY IR/ROXICODONE) 5 MG immediate release tablet Take 1 tablet (5 mg total) by mouth every 6 (six) hours as needed for severe pain. 30 tablet 0   PAZEO 0.7 % SOLN Place 1 drop into both eyes every morning.  3   senna-docusate (SENNA S) 8.6-50 MG tablet Take 2 tablets by mouth at bedtime as needed for mild constipation or moderate constipation. 60 tablet 1   simvastatin (ZOCOR) 20 MG tablet Take 20 mg by mouth daily.     No current facility-administered medications for this visit.   Facility-Administered Medications Ordered in Other Visits  Medication Dose Route Frequency Provider Last Rate Last Admin   dexamethasone (DECADRON) injection 8 mg  8 mg Intravenous Once Narda Rutherford T, NP       ondansetron [facility]) 8 mg in sodium chloride 0.9 % 50 mL IVPB  8 mg Intravenous Once Narda Rutherford T, NP       pantoprazole (PROTONIX) injection 40 mg  40 mg Intravenous Once Tyson Alias, NP        REVIEW OF SYSTEMS:   10 Point review of Systems was done is negative except as noted above.  PHYSICAL EXAMINATION: Vitals:   09/14/22 1226  BP: (!) 127/91  Pulse: 96  Resp: 20  Temp: 97.7 F (36.5 C)  SpO2: 99%   NAD GENERAL:alert, in no acute distress and comfortable SKIN: no acute rashes, no significant lesions EYES: conjunctiva are pink and non-injected, sclera anicteric OROPHARYNX: MMM, no exudates, no oropharyngeal erythema or ulceration NECK: supple, no JVD LYMPH:  no palpable  lymphadenopathy in the cervical, axillary or inguinal regions LUNGS: clear to auscultation b/l with normal respiratory effort HEART: regular rate & rhythm ABDOMEN:  normoactive bowel sounds , non tender, not distended. Extremity: no pedal edema PSYCH: alert & oriented x 3 with fluent speech NEURO: no focal motor/sensory deficits   LABORATORY DATA:  I have reviewed the data as listed     Latest Ref Rng & Units 09/14/2022   12:03 PM 07/22/2022    8:12 AM 07/03/2022    8:13 AM  CBC  WBC 4.0 - 10.5 K/uL 5.0  5.3  6.6   Hemoglobin 13.0 - 17.0  g/dL 12.2  11.9  12.8   Hematocrit 39.0 - 52.0 % 39.9  40.2  41.3   Platelets 150 - 400 K/uL 182  218  279     CBC    Component Value Date/Time   WBC 5.3 07/22/2022 0812   RBC 5.20 07/22/2022 0812   HGB 11.9 (L) 07/22/2022 0812   HGB 12.8 (L) 07/03/2022 0813   HGB 11.5 (L) 03/22/2017 1402   HCT 40.2 07/22/2022 0812   HCT 37.0 (L) 03/22/2017 1402   PLT 218 07/22/2022 0812   PLT 279 07/03/2022 0813   PLT 262 03/22/2017 1402   MCV 77.3 (L) 07/22/2022 0812   MCV 72.3 (L) 03/22/2017 1402   MCH 22.9 (L) 07/22/2022 0812   MCHC 29.6 (L) 07/22/2022 0812   RDW 16.2 (H) 07/22/2022 0812   RDW 15.6 (H) 03/22/2017 1402   LYMPHSABS 1.2 07/22/2022 0812   LYMPHSABS 1.8 03/22/2017 1402   MONOABS 0.5 07/22/2022 0812   MONOABS 0.4 03/22/2017 1402   EOSABS 0.1 07/22/2022 0812   EOSABS 0.1 03/22/2017 1402   BASOSABS 0.0 07/22/2022 0812   BASOSABS 0.0 03/22/2017 1402    .    Latest Ref Rng & Units 07/22/2022    8:12 AM 07/03/2022    8:13 AM 06/19/2022    8:00 AM  CMP  Glucose 70 - 99 mg/dL 100  107  112   BUN 8 - 23 mg/dL 32  35  32   Creatinine 0.61 - 1.24 mg/dL 2.18  2.40  2.04   Sodium 135 - 145 mmol/L 140  140  139   Potassium 3.5 - 5.1 mmol/L 4.4  4.8  4.7   Chloride 98 - 111 mmol/L 111  104  108   CO2 22 - 32 mmol/L '21  28  23   '$ Calcium 8.9 - 10.3 mg/dL 8.6  9.5  8.8   Total Protein 6.5 - 8.1 g/dL 8.1  8.6  7.5   Total Bilirubin 0.3  - 1.2 mg/dL 0.5  0.6  0.9   Alkaline Phos 38 - 126 U/L 109  143  78   AST 15 - 41 U/L 33  33  43   ALT 0 - 44 U/L '22  27  24       '$ 06/21/2019 MR ABDOMEN WWO CONTRAST (Accession [id number])     IFE 1  Comment    Comments: Immunofixation shows IgG monoclonal protein with kappa light chain  specificity.           RADIOGRAPHIC STUDIES:  MRI abd w and wo contrast: 12/17/2016: IMPRESSION: 1. Postprocedural changes of thermal ablation again noted in the right lobe of the liver, without definitive evidence to suggest local recurrence of disease. No new hepatic lesions are noted. 2. Additional incidental findings, as above.     Electronically Signed   By: Vinnie Langton M.D.   On: 12/17/2016 09:42  MRI abd w and wo contrast 01/26/2020 IMPRESSION: 1. Interval enlargement of multiple hepatic masses as detailed above, consistent with progression of multifocal hepatocellular carcinoma. There is evidence of prior ablation in hepatic segments VII and VIII. 2. No evidence of metastatic disease in the abdomen.   Electronically Signed   By: Eddie Candle M.D.   On: 01/27/2020 15:51  MRI abd and wo contrast 10/04/2021 FINDINGS: Lower chest: Unremarkable   Hepatobiliary: Multiple liver lesions are present, some of these represent previously treated tumors and some represent active enhancing malignancy.   An index solid enhancing lesion in  the lateral segment left hepatic lobe measures 3.4 by 2.8 cm on image 15 series 2, formerly 3.5 by 2.7 cm, essentially stable. Just below this a T2 hyperintense lesion measuring 1.5 by 1.1 cm on image 18 of series 2 previously measured 1.4 by 1.2 cm, likewise stable.   A currently enhancing lesion in the right hepatic lobe adjacent to the IVC measures 1.8 by 1.6 cm on image 13 series 2, stable.   A mostly centrally necrotic a 3.6 by 3.1 cm mostly centrally necrotic lesion in segment 4a of the liver on image 29 of series 21 previously  measured 4.3 by 3.0 cm. This lesion is mostly centrally necrotic today although with some nodular enhancement along its margin as on image 29 series 21.   Previously treated right hepatic lobe lesions are observed, and mostly nonenhancing or poorly enhancing.   A somewhat indistinctly marginated lesion posteriorly in segment 4a of the liver is diffusely enhancing and measures about 2.9 by 2.8 cm on image 28 of series 21. A previous faintly T2 hyperintense lesion in this vicinity on 02/20/2021 measured 2.0 by 1.6 cm, and accordingly this represents enlargement of this enhancing mass.   An enhancing mass laterally in segment 3 of the liver measures 3.0 by 2.6 cm on image 31 series 8, formerly 2.9 by 2.6 cm, essentially stable.   No biliary dilatation is observed.   Pancreas:  Unremarkable   Spleen:  Unremarkable   Adrenals/Urinary Tract:  Unremarkable   Stomach/Bowel: Unremarkable   Vascular/Lymphatic: Atherosclerosis is present, including aortoiliac atherosclerotic disease.   Other:  No supplemental non-categorized findings.   Musculoskeletal: Unchanged mild lower lumbar spondylosis and degenerative disc disease.   IMPRESSION: 1. Mixed appearance, with some of the enhancing liver lesions stable; some improved such as the centrally necrotic anterior segment 4A lesion which demonstrates substantial central necrosis compared to previous; but with at least 1 worsened lesion which is the posterior segment 4a lesion (image 29, series 20), moderately increased in size. 2. Aortic Atherosclerosis (ICD10-I70.0). 3. Mild lower lumbar spondylosis and degenerative disc disease.  Electronically Signed   By: Van Clines M.D.   On: 10/06/2021 11:08  ASSESSMENT & PLAN:   77 y.o. male with  #1 Monoclonal gammopathy of undetermined significance.  M protein 0.2 mg/dL immunofixation showing IgG monoclonal protein with kappa light chain specificity. SPEP 02/2016 - 0.3g/dl  No  overt anemia.  No overt hypercalcemia. Stable CKD creatinine 1.6  #2 bone lucencies in the right radial mid midshaft and left humeral head. PET/CT did not show any hypermetabolic bone lesions. MRI left shoulder showed no evidence of metastatic disease or multiple myeloma. The x-ray findings appear to be areas of mild demineralization. Patient was seen by orthopedics and no additional recommendations were given.  Plan -Patient's anemia is stable. No significant change in renal function. No new bone pains. No hypercalcemia . No overall no overt evidence of progression of multiple myeloma. -Myeloma labs from today show stable M protein @ 0.2g/dl with no evidence of progression- consistent with MGUS.  #3 Hepatocellular carcinoma treated with TACE and percutaneous thermal ablation , Y 90 and recent bland arterial embolization on 06/06/2021 by interventional radiology.   Patient had bland embolization on 02/05/2022 for his hepatocellular carcinoma.   Last MRI on 08/06/2015 showed slight decrease in the size of the ablation defect involving the right lobe of the liver area and no findings to suggest residual or recurrent hepatocellular carcinoma. Mild changes of liver cirrhosis.  01/16/2016 MRI Abdomen  showed resolution of previously ablated lesion but a new 1.3 cm lesion was noted which was subsequently ablated under CT guidance by interventional radiology on 01/31/2016. Elevated transaminases on 02/01/2016 were likely related to his ablation. These have since resolved.   06/24/2016 MRI Abdomen showed no evidence of recurrent hepatocellular carcinoma . 12/17/2016 MRI Abdomen shows no evidence of recurrent hepatocellular carcinoma .  10/26/17 MRI Abdomen revealed Motion degraded images. Status post thermal ablation of the segment 8 lesion, without enhancement to suggest residual viable tumor. Prior ablation of a segment 7 lesion, grossly unchanged. No convincing central enhancement  03/14/2019  MRI abdomen  w and wo contrast revealed "1. New areas of restricted diffusion, surrounding nodular arterial phase enhancement and delayed washout surrounding the ablation zone defects anteriorly in segments 8 and 7 consistent with local recurrence of hepatocellular carcinoma. 2. The peripheral ablation zone defect laterally in segment 8 is unchanged. 3. No extrahepatic tumor identified."  10/02/2019 MRI Abdomen ([id number]) revealed "1. Progressive multifocal hepatocellular carcinoma, especially anteriorly around the ablated lesion in segments 4B and 8. There are additional new lesions in segments 2 and 3. No evidence of extrahepatic metastatic disease."  01/26/2020 MRI Abdomen ([id number]) revealed "1. Interval enlargement of multiple hepatic masses as detailed above, consistent with progression of multifocal hepatocellular carcinoma. There is evidence of prior ablation in hepatic segments VII and VIII. 2. No evidence of metastatic disease in the abdomen."  10/04/2021 MRI Abdomen ([id number]) revealed "1. Mixed appearance, with some of the enhancing liver lesions stable; some improved such as the centrally necrotic anterior segment 4A lesion which demonstrates substantial central necrosis compared to previous; but with at least 1 worsened lesion which is the posterior segment 4a lesion (image 29, series 20), moderately increased in size."  04/09/2022 MRI Abdomen ([id number]) revealed "1. Again noted are multiple lesions involving both lobes of liver compatible with multifocal hepatocellular carcinoma. When compared with the previous exam there no significant change in the overall tumor volume from 10/04/2021. 2. Interval treatment response with hypoenhancement of lesion within the segment 3 and the small lesion within dome of segment 2. 3. The remaining lesions continue to exhibit variable degrees of internal enhancement. 4. There is a new enhancing lesion within the dome of liver (segment 8/4a). 5. Aortic  Atherosclerosis"   #4 Hepatitis C genotype 1A status post treatment with Viekira and Ribavarin for 24 weeks.   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    PLAN:  -Patient has no new clinical symptoms since his last clinic visit.   -Discussed lab results from today, 09/14/2022, with the patient. CBC shows slightly decreased hemoglobin of 12.2 K. CMP shows slightly elevated creatine of 2.26 and BUN of 35. Blood pressure is 127/91 during this visit. AFP tumor continues to be elevated and is higher @ 1713 -discussed that his left leg pain might be due to arthritis.  -Plan of MRI Abdominal before our next visit.   FOLLOW-UP: MRI abd in 5 weeks Labs in 5 weeks RTC with Dr Irene Limbo in 6 weeks  The total time spent in the appointment was 25 minutes* .  All of the patient's questions were answered with apparent satisfaction. The patient knows to call the clinic with any problems, questions or concerns.   [name] MD MS AAHIVMS [facility] Hematology/Oncology Physician [facility]  .*Total Encounter Time as defined by the Centers for Medicare and Medicaid Services includes, in addition to the face-to-face time  of a patient visit (documented in the note above) non-face-to-face time: obtaining and reviewing outside history, ordering and reviewing medications, tests or procedures, care coordination (communications with other health care professionals or caregivers) and documentation in the medical record.\   I, Cleda Mccreedy, am acting as a Education administrator for [name], MD. .I have reviewed the above documentation for accuracy and completeness, and I agree with the above. Brunetta Genera MD

## 2022-09-15 LAB — AFP TUMOR MARKER: AFP, Serum, Tumor Marker: 1713 ng/mL — ABNORMAL HIGH (ref 0.0–8.4)

## 2022-09-24 ENCOUNTER — Ambulatory Visit
Admission: RE | Admit: 2022-09-24 | Discharge: 2022-09-24 | Disposition: A | Discharge status: Home / Self Care | Payer: Medicare HMO | Source: Ambulatory Visit | Attending: Diagnostic Radiology | Admitting: Diagnostic Radiology

## 2022-09-24 DIAGNOSIS — C801 Malignant (primary) neoplasm, unspecified: Secondary | ICD-10-CM | POA: Diagnosis not present

## 2022-09-24 DIAGNOSIS — C787 Secondary malignant neoplasm of liver and intrahepatic bile duct: Secondary | ICD-10-CM | POA: Diagnosis not present

## 2022-09-24 DIAGNOSIS — C220 Liver cell carcinoma: Secondary | ICD-10-CM

## 2022-09-24 NOTE — Progress Notes (Signed)
Chief Complaint: Patient was consulted remotely today (TeleHealth) for follow-up of hepatocellular carcinoma.  Referring Physician(s): [name]  History of Present Illness: Aaron [name] is a 77 y.o. male with history of multifocal hepatocellular carcinoma.  Patient has undergone systemic treatment and numerous liver directed therapies.  Most recently, the patient had bland embolization of the left hepatic arteries on 07/22/2022.  He tolerated this most recent treatment well except for back pain that lasted a couple days after the procedure.  Back pain was probably related to his positioning during the procedure and the back pain has resolved. Patient recently saw his oncologist and had follow-up labs.  Patient reports no significant changes in overall health.  He denies pain, shortness of breath, no bowel issues and no urinary issues.  Patient reports decreased appetite.  He continues to stay active during the day.  He is scheduled to see nephrology later this month.  Abdominal MRI has been ordered by oncology.  Past Medical History:  Diagnosis Date   Arthritis    Diabetes mellitus without complication ([place])    type II    Dyspnea    Elevated liver enzymes    Fatty liver    Hepatitis C    genotype 1A status post treatment with Linzie Collin and Ribavarin for 24 weeks   Hepatocellular carcinoma (HCC) 01/30/2014   Path   History of colon polyps    Hyperlipidemia    Hypertension    Iron deficiency anemia    hx of    MGUS (monoclonal gammopathy of unknown significance)     Past Surgical History:  Procedure Laterality Date   COLONOSCOPY     ESOPHAGOGASTRODUODENOSCOPY  2016   IR 3D INDEPENDENT WKST  01/23/2022   IR ANGIOGRAM SELECTIVE EACH ADDITIONAL VESSEL  04/04/2019   IR ANGIOGRAM SELECTIVE EACH ADDITIONAL VESSEL  04/04/2019   IR ANGIOGRAM SELECTIVE EACH ADDITIONAL VESSEL  04/04/2019   IR ANGIOGRAM SELECTIVE EACH ADDITIONAL VESSEL  04/04/2019   IR ANGIOGRAM SELECTIVE EACH  ADDITIONAL VESSEL  04/04/2019   IR ANGIOGRAM SELECTIVE EACH ADDITIONAL VESSEL  04/04/2019   IR ANGIOGRAM SELECTIVE EACH ADDITIONAL VESSEL  04/04/2019   IR ANGIOGRAM SELECTIVE EACH ADDITIONAL VESSEL  05/16/2019   IR ANGIOGRAM SELECTIVE EACH ADDITIONAL VESSEL  05/16/2019   IR ANGIOGRAM SELECTIVE EACH ADDITIONAL VESSEL  05/16/2019   IR ANGIOGRAM SELECTIVE EACH ADDITIONAL VESSEL  02/14/2020   IR ANGIOGRAM SELECTIVE EACH ADDITIONAL VESSEL  02/14/2020   IR ANGIOGRAM SELECTIVE EACH ADDITIONAL VESSEL  02/14/2020   IR ANGIOGRAM SELECTIVE EACH ADDITIONAL VESSEL  02/14/2020   IR ANGIOGRAM SELECTIVE EACH ADDITIONAL VESSEL  03/01/2020   IR ANGIOGRAM SELECTIVE EACH ADDITIONAL VESSEL  03/01/2020   IR ANGIOGRAM SELECTIVE EACH ADDITIONAL VESSEL  03/01/2020   IR ANGIOGRAM SELECTIVE EACH ADDITIONAL VESSEL  03/01/2020   IR ANGIOGRAM SELECTIVE EACH ADDITIONAL VESSEL  03/01/2020   IR ANGIOGRAM SELECTIVE EACH ADDITIONAL VESSEL  06/18/2020   IR ANGIOGRAM SELECTIVE EACH ADDITIONAL VESSEL  06/18/2020   IR ANGIOGRAM SELECTIVE EACH ADDITIONAL VESSEL  11/19/2020   IR ANGIOGRAM SELECTIVE EACH ADDITIONAL VESSEL  11/19/2020   IR ANGIOGRAM SELECTIVE EACH ADDITIONAL VESSEL  06/06/2021   IR ANGIOGRAM SELECTIVE EACH ADDITIONAL VESSEL  06/06/2021   IR ANGIOGRAM SELECTIVE EACH ADDITIONAL VESSEL  06/06/2021   IR ANGIOGRAM SELECTIVE EACH ADDITIONAL VESSEL  06/06/2021   IR ANGIOGRAM SELECTIVE EACH ADDITIONAL VESSEL  01/23/2022   IR ANGIOGRAM SELECTIVE EACH ADDITIONAL VESSEL  01/23/2022   IR ANGIOGRAM SELECTIVE EACH ADDITIONAL VESSEL  02/05/2022   IR  ANGIOGRAM SELECTIVE EACH ADDITIONAL VESSEL  02/05/2022   IR ANGIOGRAM SELECTIVE EACH ADDITIONAL VESSEL  06/19/2022   IR ANGIOGRAM SELECTIVE EACH ADDITIONAL VESSEL  06/19/2022   IR ANGIOGRAM SELECTIVE EACH ADDITIONAL VESSEL  07/22/2022   IR ANGIOGRAM SELECTIVE EACH ADDITIONAL VESSEL  07/22/2022   IR ANGIOGRAM VISCERAL SELECTIVE  04/04/2019   IR ANGIOGRAM VISCERAL SELECTIVE  05/16/2019   IR ANGIOGRAM VISCERAL  SELECTIVE  02/14/2020   IR ANGIOGRAM VISCERAL SELECTIVE  02/14/2020   IR ANGIOGRAM VISCERAL SELECTIVE  03/01/2020   IR ANGIOGRAM VISCERAL SELECTIVE  06/18/2020   IR ANGIOGRAM VISCERAL SELECTIVE  11/19/2020   IR ANGIOGRAM VISCERAL SELECTIVE  01/23/2022   IR ANGIOGRAM VISCERAL SELECTIVE  02/05/2022   IR ANGIOGRAM VISCERAL SELECTIVE  06/19/2022   IR ANGIOGRAM VISCERAL SELECTIVE  07/22/2022   IR EMBO ARTERIAL NOT HEMORR HEMANG INC GUIDE ROADMAPPING  02/14/2020   IR EMBO TUMOR ORGAN ISCHEMIA INFARCT INC GUIDE ROADMAPPING  04/04/2019   IR EMBO TUMOR ORGAN ISCHEMIA INFARCT INC GUIDE ROADMAPPING  05/16/2019   IR EMBO TUMOR ORGAN ISCHEMIA INFARCT INC GUIDE ROADMAPPING  03/01/2020   IR EMBO TUMOR ORGAN ISCHEMIA INFARCT INC GUIDE ROADMAPPING  06/18/2020   IR EMBO TUMOR ORGAN ISCHEMIA INFARCT INC GUIDE ROADMAPPING  11/19/2020   IR EMBO TUMOR ORGAN ISCHEMIA INFARCT INC GUIDE ROADMAPPING  06/06/2021   IR EMBO TUMOR ORGAN ISCHEMIA INFARCT INC GUIDE ROADMAPPING  02/05/2022   IR EMBO TUMOR ORGAN ISCHEMIA INFARCT INC GUIDE ROADMAPPING  07/22/2022   IR GENERIC HISTORICAL  02/26/2016   IR RADIOLOGIST EVAL & MGMT 02/26/2016 Markus Daft, MD [facility]   IR GENERIC HISTORICAL  01/16/2016   IR RADIOLOGIST EVAL & MGMT 01/16/2016 Markus Daft, MD [facility]   IR GENERIC HISTORICAL  06/24/2016   IR RADIOLOGIST EVAL & MGMT 06/24/2016 Aletta Edouard, MD [facility]   IR RADIOLOGIST EVAL & MGMT  12/17/2016   IR RADIOLOGIST EVAL & MGMT  06/16/2017   IR RADIOLOGIST EVAL & MGMT  09/08/2017   IR RADIOLOGIST EVAL & MGMT  10/26/2017   IR RADIOLOGIST EVAL & MGMT  05/12/2018   IR RADIOLOGIST EVAL & MGMT  07/26/2018   IR RADIOLOGIST EVAL & MGMT  03/22/2019   IR RADIOLOGIST EVAL & MGMT  04/11/2019   IR RADIOLOGIST EVAL & MGMT  06/28/2019   IR RADIOLOGIST EVAL & MGMT  10/10/2019   IR RADIOLOGIST EVAL & MGMT  05/21/2020   IR RADIOLOGIST EVAL & MGMT  06/12/2020   IR RADIOLOGIST EVAL & MGMT  10/24/2020   IR RADIOLOGIST EVAL & MGMT  12/11/2020    IR RADIOLOGIST EVAL & MGMT  02/27/2021   IR RADIOLOGIST EVAL & MGMT  11/25/2021   IR RADIOLOGIST EVAL & MGMT  03/20/2022   IR RADIOLOGIST EVAL & MGMT  04/14/2022   IR US GUIDE VASC ACCESS LEFT  05/16/2019   IR US GUIDE VASC ACCESS LEFT  02/05/2022   IR US GUIDE VASC ACCESS RIGHT  04/04/2019   IR US GUIDE VASC ACCESS RIGHT  02/14/2020   IR US GUIDE VASC ACCESS RIGHT  03/01/2020   IR US GUIDE VASC ACCESS RIGHT  06/18/2020   IR US GUIDE VASC ACCESS RIGHT  11/19/2020   IR US GUIDE VASC ACCESS RIGHT  06/06/2021   IR US GUIDE VASC ACCESS RIGHT  01/23/2022   IR US GUIDE [place] RIGHT  06/19/2022   IR US GUIDE [place] RIGHT  07/22/2022   POLYPECTOMY     RADIOFREQUENCY ABLATION N/A 07/21/2017  Procedure: CT MICROWAVE THERMAL ABLATION-LIVER;  Surgeon: Markus Daft, MD;  Location: [facility];  Service: Anesthesiology;  Laterality: N/A;   RADIOFREQUENCY ABLATION N/A 06/29/2018   Procedure: CT MICROWAVE THERMAL ABLATION;  Surgeon: Markus Daft, MD;  Location: [facility];  Service: Anesthesiology;  Laterality: N/A;    Allergies: Patient has no known allergies.  Medications: Prior to Admission medications   Medication Sig Start Date End Date Taking? Authorizing Provider  ACCU-CHEK AVIVA PLUS test strip  11/19/19   [provider]  Accu-Chek Softclix Lancets lancets  09/12/19   [provider]  amLODipine (NORVASC) 10 MG tablet TAKE 1 TABLET BY MOUTH EVERY DAY 07/01/22   Brunetta Genera, MD  aspirin EC 81 MG tablet Take 81 mg by mouth every morning.     [provider]  cholecalciferol (VITAMIN D) 1000 UNITS tablet Take 1,000 Units by mouth every morning.     [provider]  glipiZIDE (GLUCOTROL XL) 2.5 MG 24 hr tablet Take 2.5 mg by mouth daily with breakfast.    [provider]  Insulin Glargine (LANTUS SOLOSTAR) 100 UNIT/ML Solostar Pen Inject 32 Units into the skin daily.    [provider]  lenvatinib 12 mg daily dose (LENVIMA) 3 x 4 MG capsule TAKE 3  CAPSULES ('12MG'$ ) BY MOUTH DAILY. 06/04/22 06/04/23  Brunetta Genera, MD  Metoprolol Succinate 50 MG CS24 1 capsule    [provider]  Multiple Vitamin (MULTIVITAMIN WITH MINERALS) TABS tablet Take 1 tablet daily by mouth.    [provider]  oxyCODONE (OXY IR/ROXICODONE) 5 MG immediate release tablet Take 1 tablet (5 mg total) by mouth every 6 (six) hours as needed for severe pain. 07/13/22   Brunetta Genera, MD  PAZEO 0.7 % SOLN Place 1 drop into both eyes every morning. 05/09/18   [provider]  senna-docusate (SENNA S) 8.6-50 MG tablet Take 2 tablets by mouth at bedtime as needed for mild constipation or moderate constipation. 07/13/22   Brunetta Genera, MD  simvastatin (ZOCOR) 20 MG tablet Take 20 mg by mouth daily.    [provider]     No family history on file.  Social History   Socioeconomic History   Marital status: Married    Spouse name: Not on file   Number of children: Not on file   Years of education: Not on file   Highest education level: Not on file  Occupational History   Not on file  Tobacco Use   Smoking status: Former    Types: Cigarettes    Quit date: 01/30/1994    Years since quitting: 28.6   Smokeless tobacco: Never   Tobacco comments:    stopped 25 yrs ago  Vaping Use   Vaping Use: Never used  Substance and Sexual Activity   Alcohol use: No    Comment: stopped 30 years ago    Drug use: No   Sexual activity: Not Currently  Other Topics Concern   Not on file  Social History Narrative   Not on file   Social Determinants of Health   Financial Resource Strain: Not on file  Food Insecurity: Not on file  Transportation Needs: Not on file  Physical Activity: Not on file  Stress: Not on file  Social Connections: Not on file    ECOG Status: 1 - Symptomatic but completely ambulatory  Review of Systems  Constitutional:  Positive for appetite change.  Respiratory: Negative.    Cardiovascular:  Negative.  Gastrointestinal: Negative.   Genitourinary: Negative.   Musculoskeletal:  Positive for back pain.     Physical Exam No direct physical exam was performed   Vital Signs: There were no vitals taken for this visit.  Imaging: No results found.  Labs:  CBC: Recent Labs    06/19/22 0800 07/03/22 0813 07/22/22 0812 09/14/22 1203  WBC 5.7 6.6 5.3 5.0  HGB 11.7* 12.8* 11.9* 12.2*  HCT 38.8* 41.3 40.2 39.9  PLT 159 279 218 182    COAGS: Recent Labs    01/23/22 0808 02/05/22 0811 06/19/22 0800 07/22/22 0812  INR 1.0 1.0 1.0 1.1    BMP: Recent Labs    06/19/22 0800 07/03/22 0813 07/22/22 0812 09/14/22 1203  NA 139 140 140 138  K 4.7 4.8 4.4 4.4  CL 108 104 111 107  CO2 23 28 21* 23  GLUCOSE 112* 107* 100* 103*  BUN 32* 35* 32* 35*  CALCIUM 8.8* 9.5 8.6* 9.4  CREATININE 2.04* 2.40* 2.18* 2.26*  GFRNONAA 33* 27* 31* 30*    LIVER FUNCTION TESTS: Recent Labs    06/19/22 0800 07/03/22 0813 07/22/22 0812 09/14/22 1203  BILITOT 0.9 0.6 0.5 0.5  AST 43* 33 33 41  ALT '24 27 22 20  '$ ALKPHOS 78 143* 109 97  PROT 7.5 8.6* 8.1 7.6  ALBUMIN 3.6 3.7 3.5 3.7    TUMOR MARKERS: AFP = 1713 (09/14/22), 994 (07/22/22), 381 (02/09/22)    Assessment and Plan:  77 year old with multifocal hepatocellular carcinoma.  Patient has had multiple liver directed therapies and he is currently taking lenvatinib.  Unfortunately, the patient's AFP level continues to increase despite liver directed therapies.  Patient has multifocal disease but most recently the disease has been predominantly in the left hepatic lobe.  We have been treating left hepatic arteries with bland embolization due to previous Y90 radioembolization in these areas.  Patient is scheduled to have a follow-up MRI and we will need to assess the disease distribution.  If the disease continues to be predominantly in the left hepatic lobe then  we can continue with bland embolization or chemoembolization to  the left hepatic lobe.  If the disease has progressed in the right hepatic lobe, he may be a candidate for another Y90 radioembolization to the right hepatic lobe.  At this time, patient wants to continue with liver directed therapies if needed.  Will contact the patient and Dr. Irene Limbo following the abdominal MRI.  In the interim, the patient can contact us if he has any questions or concerns.   Electronically Signed: Burman Riis 09/24/2022, 8:13 AM   I spent a total of    10 Minutes in remote  clinical consultation, greater than 50% of which was counseling/coordinating care for hepatocellular carcinoma.    Visit type: Audio only (telephone). Audio (no video) only due to technical limitations. Alternative for in-person consultation at [facility], [address] [place], [place], [place]. This visit type was conducted due to national recommendations for restrictions regarding the COVID-19 Pandemic (e.g. social distancing).  This format is felt to be most appropriate for this patient at this time.  All issues noted in this document were discussed and addressed.  Patient ID: Aaron [name], male   DOB: 1946/06/18, 77 y.o.   MRN: [id number]

## 2022-09-28 DIAGNOSIS — N184 Chronic kidney disease, stage 4 (severe): Secondary | ICD-10-CM | POA: Diagnosis not present

## 2022-09-29 ENCOUNTER — Other Ambulatory Visit: Payer: Self-pay | Admitting: Hematology

## 2022-09-29 ENCOUNTER — Other Ambulatory Visit (HOSPITAL_COMMUNITY): Payer: Self-pay

## 2022-09-29 MED ORDER — LENVATINIB (12 MG DAILY DOSE) 3 X 4 MG PO CPPK
ORAL_CAPSULE | ORAL | 2 refills | Status: DC
  Filled 2022-09-29: qty 90, 30d supply, fill #0
  Filled 2022-11-02: qty 90, 30d supply, fill #1
  Filled 2022-12-01: qty 90, 30d supply, fill #2

## 2022-10-07 ENCOUNTER — Other Ambulatory Visit: Payer: Self-pay

## 2022-10-08 DIAGNOSIS — R809 Proteinuria, unspecified: Secondary | ICD-10-CM | POA: Diagnosis not present

## 2022-10-08 DIAGNOSIS — E1122 Type 2 diabetes mellitus with diabetic chronic kidney disease: Secondary | ICD-10-CM | POA: Diagnosis not present

## 2022-10-08 DIAGNOSIS — I129 Hypertensive chronic kidney disease with stage 1 through stage 4 chronic kidney disease, or unspecified chronic kidney disease: Secondary | ICD-10-CM | POA: Diagnosis not present

## 2022-10-08 DIAGNOSIS — N1832 Chronic kidney disease, stage 3b: Secondary | ICD-10-CM | POA: Diagnosis not present

## 2022-10-08 DIAGNOSIS — D472 Monoclonal gammopathy: Secondary | ICD-10-CM | POA: Diagnosis not present

## 2022-10-08 DIAGNOSIS — E1129 Type 2 diabetes mellitus with other diabetic kidney complication: Secondary | ICD-10-CM | POA: Diagnosis not present

## 2022-10-08 DIAGNOSIS — C220 Liver cell carcinoma: Secondary | ICD-10-CM | POA: Diagnosis not present

## 2022-10-16 ENCOUNTER — Other Ambulatory Visit: Payer: Self-pay

## 2022-10-16 DIAGNOSIS — C220 Liver cell carcinoma: Secondary | ICD-10-CM

## 2022-10-20 ENCOUNTER — Inpatient Hospital Stay: Payer: Medicare HMO | Attending: Hematology

## 2022-10-20 DIAGNOSIS — D472 Monoclonal gammopathy: Secondary | ICD-10-CM | POA: Insufficient documentation

## 2022-10-20 DIAGNOSIS — C220 Liver cell carcinoma: Secondary | ICD-10-CM

## 2022-10-20 DIAGNOSIS — Z8505 Personal history of malignant neoplasm of liver: Secondary | ICD-10-CM | POA: Insufficient documentation

## 2022-10-20 LAB — CBC WITH DIFFERENTIAL (CANCER CENTER ONLY)
Abs Immature Granulocytes: 0.01 10*3/uL (ref 0.00–0.07)
Basophils Absolute: 0 10*3/uL (ref 0.0–0.1)
Basophils Relative: 0 %
Eosinophils Absolute: 0.1 10*3/uL (ref 0.0–0.5)
Eosinophils Relative: 3 %
HCT: 42.2 % (ref 39.0–52.0)
Hemoglobin: 13.1 g/dL (ref 13.0–17.0)
Immature Granulocytes: 0 %
Lymphocytes Relative: 28 %
Lymphs Abs: 1.4 10*3/uL (ref 0.7–4.0)
MCH: 23.9 pg — ABNORMAL LOW (ref 26.0–34.0)
MCHC: 31 g/dL (ref 30.0–36.0)
MCV: 76.9 fL — ABNORMAL LOW (ref 80.0–100.0)
Monocytes Absolute: 0.5 10*3/uL (ref 0.1–1.0)
Monocytes Relative: 10 %
Neutro Abs: 2.9 10*3/uL (ref 1.7–7.7)
Neutrophils Relative %: 59 %
Platelet Count: 165 10*3/uL (ref 150–400)
RBC: 5.49 MIL/uL (ref 4.22–5.81)
RDW: 16.3 % — ABNORMAL HIGH (ref 11.5–15.5)
WBC Count: 4.8 10*3/uL (ref 4.0–10.5)
nRBC: 0 % (ref 0.0–0.2)

## 2022-10-20 LAB — CMP (CANCER CENTER ONLY)
ALT: 19 U/L (ref 0–44)
AST: 41 U/L (ref 15–41)
Albumin: 3.9 g/dL (ref 3.5–5.0)
Alkaline Phosphatase: 81 U/L (ref 38–126)
Anion gap: 7 (ref 5–15)
BUN: 27 mg/dL — ABNORMAL HIGH (ref 8–23)
CO2: 28 mmol/L (ref 22–32)
Calcium: 8.7 mg/dL — ABNORMAL LOW (ref 8.9–10.3)
Chloride: 105 mmol/L (ref 98–111)
Creatinine: 2.14 mg/dL — ABNORMAL HIGH (ref 0.61–1.24)
GFR, Estimated: 31 mL/min — ABNORMAL LOW (ref >60)
Glucose, Bld: 96 mg/dL (ref 70–99)
Potassium: 4.4 mmol/L (ref 3.5–5.1)
Sodium: 140 mmol/L (ref 135–145)
Total Bilirubin: 0.6 mg/dL (ref 0.3–1.2)
Total Protein: 7.6 g/dL (ref 6.5–8.1)

## 2022-10-20 LAB — MAGNESIUM: Magnesium: 1.7 mg/dL (ref 1.7–2.4)

## 2022-10-22 ENCOUNTER — Ambulatory Visit (HOSPITAL_COMMUNITY)
Admission: RE | Admit: 2022-10-22 | Discharge: 2022-10-22 | Disposition: A | Discharge status: Home / Self Care | Payer: Medicare HMO | Source: Ambulatory Visit | Attending: Hematology | Admitting: Hematology

## 2022-10-22 DIAGNOSIS — K7689 Other specified diseases of liver: Secondary | ICD-10-CM | POA: Diagnosis not present

## 2022-10-22 DIAGNOSIS — C220 Liver cell carcinoma: Secondary | ICD-10-CM | POA: Insufficient documentation

## 2022-10-22 LAB — AFP TUMOR MARKER: AFP, Serum, Tumor Marker: 2528 ng/mL — ABNORMAL HIGH (ref 0.0–8.4)

## 2022-10-27 ENCOUNTER — Inpatient Hospital Stay: Payer: Medicare HMO | Admitting: Hematology

## 2022-10-27 DIAGNOSIS — N1832 Chronic kidney disease, stage 3b: Secondary | ICD-10-CM | POA: Diagnosis not present

## 2022-10-28 ENCOUNTER — Other Ambulatory Visit (HOSPITAL_COMMUNITY): Payer: Self-pay

## 2022-10-28 ENCOUNTER — Telehealth: Payer: Self-pay | Admitting: Family Medicine

## 2022-10-28 NOTE — Telephone Encounter (Signed)
Per 3/5 IB reached out to patient to reschedule missed appointment, patient aware of date and time of appointment.

## 2022-11-02 ENCOUNTER — Other Ambulatory Visit (HOSPITAL_COMMUNITY): Payer: Self-pay

## 2022-11-04 ENCOUNTER — Other Ambulatory Visit (HOSPITAL_COMMUNITY): Payer: Self-pay

## 2022-11-04 ENCOUNTER — Other Ambulatory Visit: Payer: Self-pay | Admitting: Diagnostic Radiology

## 2022-11-04 DIAGNOSIS — C220 Liver cell carcinoma: Secondary | ICD-10-CM

## 2022-11-10 ENCOUNTER — Ambulatory Visit
Admission: RE | Admit: 2022-11-10 | Discharge: 2022-11-10 | Disposition: A | Discharge status: Home / Self Care | Payer: Medicare HMO | Source: Ambulatory Visit | Attending: Diagnostic Radiology | Admitting: Diagnostic Radiology

## 2022-11-10 DIAGNOSIS — C801 Malignant (primary) neoplasm, unspecified: Secondary | ICD-10-CM | POA: Diagnosis not present

## 2022-11-10 DIAGNOSIS — C787 Secondary malignant neoplasm of liver and intrahepatic bile duct: Secondary | ICD-10-CM | POA: Diagnosis not present

## 2022-11-10 DIAGNOSIS — C220 Liver cell carcinoma: Secondary | ICD-10-CM

## 2022-11-10 NOTE — Progress Notes (Signed)
Patient ID: Aaron [name], male   DOB: 1946/06/10, 77 y.o.   MRN: [id number]       Chief Complaint: Patient was consulted remotely today ([place]) for follow-up hepatocellular carcinoma and liver directed therapies.  Referring Physician(s): [name]  History of Present Illness: Aaron [name] is a 77 y.o. male with multifocal hepatocellular carcinoma.  He has undergone multiple liver directed therapies including ablation, Y90 radioembolization and most recently bland embolization to the left hepatic lobe.  Patient is also taking lenvatinib.  Patient reports no change in his overall health.  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  No significant change in his appetite or weight.  He reports no new areas of pain or discomfort.  He was scheduled to see Dr. Irene Limbo last month but could not make the clinic visit due to a family issue.  He is scheduled to follow-up with oncology next month.  Since his last visit, the patient has had updated labs and an MRI without contrast.  Past Medical History:  Diagnosis Date   Arthritis    Diabetes mellitus without complication ([place])    type II    Dyspnea    Elevated liver enzymes    Fatty liver    Hepatitis C    genotype 1A status post treatment with Linzie Collin and Ribavarin for 24 weeks   Hepatocellular carcinoma ([place]) 01/30/2014   Path   History of colon polyps    Hyperlipidemia    Hypertension    Iron deficiency anemia    hx of    MGUS (monoclonal gammopathy of unknown significance)     Past Surgical History:  Procedure Laterality Date   COLONOSCOPY     ESOPHAGOGASTRODUODENOSCOPY  2016   IR 3D INDEPENDENT WKST  01/23/2022   IR ANGIOGRAM SELECTIVE EACH ADDITIONAL VESSEL  04/04/2019   IR ANGIOGRAM SELECTIVE EACH ADDITIONAL VESSEL  04/04/2019   IR ANGIOGRAM SELECTIVE EACH ADDITIONAL VESSEL  04/04/2019   IR ANGIOGRAM SELECTIVE EACH ADDITIONAL VESSEL  04/04/2019   IR ANGIOGRAM SELECTIVE EACH ADDITIONAL VESSEL  04/04/2019   IR ANGIOGRAM  SELECTIVE EACH ADDITIONAL VESSEL  04/04/2019   IR ANGIOGRAM SELECTIVE EACH ADDITIONAL VESSEL  04/04/2019   IR ANGIOGRAM SELECTIVE EACH ADDITIONAL VESSEL  05/16/2019   IR ANGIOGRAM SELECTIVE EACH ADDITIONAL VESSEL  05/16/2019   IR ANGIOGRAM SELECTIVE EACH ADDITIONAL VESSEL  05/16/2019   IR ANGIOGRAM SELECTIVE EACH ADDITIONAL VESSEL  02/14/2020   IR ANGIOGRAM SELECTIVE EACH ADDITIONAL VESSEL  02/14/2020   IR ANGIOGRAM SELECTIVE EACH ADDITIONAL VESSEL  02/14/2020   IR ANGIOGRAM SELECTIVE EACH ADDITIONAL VESSEL  02/14/2020   IR ANGIOGRAM SELECTIVE EACH ADDITIONAL VESSEL  03/01/2020   IR ANGIOGRAM SELECTIVE EACH ADDITIONAL VESSEL  03/01/2020   IR ANGIOGRAM SELECTIVE EACH ADDITIONAL VESSEL  03/01/2020   IR ANGIOGRAM SELECTIVE EACH ADDITIONAL VESSEL  03/01/2020   IR ANGIOGRAM SELECTIVE EACH ADDITIONAL VESSEL  03/01/2020   IR ANGIOGRAM SELECTIVE EACH ADDITIONAL VESSEL  06/18/2020   IR ANGIOGRAM SELECTIVE EACH ADDITIONAL VESSEL  06/18/2020   IR ANGIOGRAM SELECTIVE EACH ADDITIONAL VESSEL  11/19/2020   IR ANGIOGRAM SELECTIVE EACH ADDITIONAL VESSEL  11/19/2020   IR ANGIOGRAM SELECTIVE EACH ADDITIONAL VESSEL  06/06/2021   IR ANGIOGRAM SELECTIVE EACH ADDITIONAL VESSEL  06/06/2021   IR ANGIOGRAM SELECTIVE EACH ADDITIONAL VESSEL  06/06/2021   IR ANGIOGRAM SELECTIVE EACH ADDITIONAL VESSEL  06/06/2021   IR ANGIOGRAM SELECTIVE EACH ADDITIONAL VESSEL  01/23/2022   IR ANGIOGRAM SELECTIVE EACH ADDITIONAL VESSEL  01/23/2022   IR [place]  ADDITIONAL VESSEL  02/05/2022   IR ANGIOGRAM SELECTIVE EACH ADDITIONAL VESSEL  02/05/2022   IR ANGIOGRAM SELECTIVE EACH ADDITIONAL VESSEL  06/19/2022   IR ANGIOGRAM SELECTIVE EACH ADDITIONAL VESSEL  06/19/2022   IR ANGIOGRAM SELECTIVE EACH ADDITIONAL VESSEL  07/22/2022   IR ANGIOGRAM SELECTIVE EACH ADDITIONAL VESSEL  07/22/2022   IR ANGIOGRAM VISCERAL SELECTIVE  04/04/2019   IR ANGIOGRAM VISCERAL SELECTIVE  05/16/2019   IR ANGIOGRAM VISCERAL SELECTIVE  02/14/2020   IR ANGIOGRAM VISCERAL  SELECTIVE  02/14/2020   IR ANGIOGRAM VISCERAL SELECTIVE  03/01/2020   IR ANGIOGRAM VISCERAL SELECTIVE  06/18/2020   IR ANGIOGRAM VISCERAL SELECTIVE  11/19/2020   IR ANGIOGRAM VISCERAL SELECTIVE  01/23/2022   IR ANGIOGRAM VISCERAL SELECTIVE  02/05/2022   IR ANGIOGRAM VISCERAL SELECTIVE  06/19/2022   IR ANGIOGRAM VISCERAL SELECTIVE  07/22/2022   IR EMBO ARTERIAL NOT HEMORR HEMANG INC GUIDE ROADMAPPING  02/14/2020   IR EMBO TUMOR ORGAN ISCHEMIA INFARCT INC GUIDE ROADMAPPING  04/04/2019   IR EMBO TUMOR ORGAN ISCHEMIA INFARCT INC GUIDE ROADMAPPING  05/16/2019   IR EMBO TUMOR ORGAN ISCHEMIA INFARCT INC GUIDE ROADMAPPING  03/01/2020   IR EMBO TUMOR ORGAN ISCHEMIA INFARCT INC GUIDE ROADMAPPING  06/18/2020   IR EMBO TUMOR ORGAN ISCHEMIA INFARCT INC GUIDE ROADMAPPING  11/19/2020   IR EMBO TUMOR ORGAN ISCHEMIA INFARCT INC GUIDE ROADMAPPING  06/06/2021   IR EMBO TUMOR ORGAN ISCHEMIA INFARCT INC GUIDE ROADMAPPING  02/05/2022   IR EMBO TUMOR ORGAN ISCHEMIA INFARCT INC GUIDE ROADMAPPING  07/22/2022   IR GENERIC HISTORICAL  02/26/2016   IR RADIOLOGIST EVAL & MGMT 02/26/2016 Markus Daft, MD [facility]   IR GENERIC HISTORICAL  01/16/2016   IR RADIOLOGIST EVAL & MGMT 01/16/2016 Markus Daft, MD [facility]   IR GENERIC HISTORICAL  06/24/2016   IR RADIOLOGIST EVAL & MGMT 06/24/2016 Aletta Edouard, MD [facility]   IR RADIOLOGIST EVAL & MGMT  12/17/2016   IR RADIOLOGIST EVAL & MGMT  06/16/2017   IR RADIOLOGIST EVAL & MGMT  09/08/2017   IR RADIOLOGIST EVAL & MGMT  10/26/2017   IR RADIOLOGIST EVAL & MGMT  05/12/2018   IR RADIOLOGIST EVAL & MGMT  07/26/2018   IR RADIOLOGIST EVAL & MGMT  03/22/2019   IR RADIOLOGIST EVAL & MGMT  04/11/2019   IR RADIOLOGIST EVAL & MGMT  06/28/2019   IR RADIOLOGIST EVAL & MGMT  10/10/2019   IR RADIOLOGIST EVAL & MGMT  05/21/2020   IR RADIOLOGIST EVAL & MGMT  06/12/2020   IR RADIOLOGIST EVAL & MGMT  10/24/2020   IR RADIOLOGIST EVAL & MGMT  12/11/2020   IR RADIOLOGIST EVAL & MGMT  02/27/2021   IR  RADIOLOGIST EVAL & MGMT  11/25/2021   IR RADIOLOGIST EVAL & MGMT  03/20/2022   IR RADIOLOGIST EVAL & MGMT  04/14/2022   IR US GUIDE VASC ACCESS LEFT  05/16/2019   IR US GUIDE VASC ACCESS LEFT  02/05/2022   IR US GUIDE VASC ACCESS RIGHT  04/04/2019   IR US GUIDE VASC ACCESS RIGHT  02/14/2020   IR US GUIDE VASC ACCESS RIGHT  03/01/2020   IR US GUIDE VASC ACCESS RIGHT  06/18/2020   IR US GUIDE VASC ACCESS RIGHT  11/19/2020   IR US GUIDE VASC ACCESS RIGHT  06/06/2021   IR US GUIDE VASC ACCESS RIGHT  01/23/2022   IR US GUIDE [place] RIGHT  06/19/2022   IR US GUIDE [place] RIGHT  07/22/2022   POLYPECTOMY  RADIOFREQUENCY ABLATION N/A 07/21/2017   Procedure: CT MICROWAVE THERMAL ABLATION-LIVER;  Surgeon: Markus Daft, MD;  Location: [facility];  Service: Anesthesiology;  Laterality: N/A;   RADIOFREQUENCY ABLATION N/A 06/29/2018   Procedure: CT MICROWAVE THERMAL ABLATION;  Surgeon: Markus Daft, MD;  Location: [facility];  Service: Anesthesiology;  Laterality: N/A;    Allergies: Patient has no known allergies.  Medications: Prior to Admission medications   Medication Sig Start Date End Date Taking? Authorizing Provider  ACCU-CHEK AVIVA PLUS test strip  11/19/19   [provider]  Accu-Chek Softclix Lancets lancets  09/12/19   [provider]  amLODipine (NORVASC) 10 MG tablet TAKE 1 TABLET BY MOUTH EVERY DAY 07/01/22   Brunetta Genera, MD  aspirin EC 81 MG tablet Take 81 mg by mouth every morning.     [provider]  cholecalciferol (VITAMIN D) 1000 UNITS tablet Take 1,000 Units by mouth every morning.     [provider]  glipiZIDE (GLUCOTROL XL) 2.5 MG 24 hr tablet Take 2.5 mg by mouth daily with breakfast.    [provider]  Insulin Glargine (LANTUS SOLOSTAR) 100 UNIT/ML Solostar Pen Inject 32 Units into the skin daily.    [provider]  lenvatinib 12 mg daily dose (LENVIMA) 3 x 4 MG capsule TAKE 3 CAPSULES (12MG ) BY MOUTH DAILY. 09/29/22 09/29/23   Brunetta Genera, MD  Metoprolol Succinate 50 MG CS24 1 capsule    [provider]  Multiple Vitamin (MULTIVITAMIN WITH MINERALS) TABS tablet Take 1 tablet daily by mouth.    [provider]  oxyCODONE (OXY IR/ROXICODONE) 5 MG immediate release tablet Take 1 tablet (5 mg total) by mouth every 6 (six) hours as needed for severe pain. 07/13/22   Brunetta Genera, MD  PAZEO 0.7 % SOLN Place 1 drop into both eyes every morning. 05/09/18   [provider]  senna-docusate (SENNA S) 8.6-50 MG tablet Take 2 tablets by mouth at bedtime as needed for mild constipation or moderate constipation. 07/13/22   Brunetta Genera, MD  simvastatin (ZOCOR) 20 MG tablet Take 20 mg by mouth daily.    [provider]     No family history on file.  Social History   Socioeconomic History   Marital status: Married    Spouse name: Not on file   Number of children: Not on file   Years of education: Not on file   Highest education level: Not on file  Occupational History   Not on file  Tobacco Use   Smoking status: Former    Types: Cigarettes    Quit date: 01/30/1994    Years since quitting: 28.7   Smokeless tobacco: Never   Tobacco comments:    stopped 25 yrs ago  Vaping Use   Vaping Use: Never used  Substance and Sexual Activity   Alcohol use: No    Comment: stopped 30 years ago    Drug use: No   Sexual activity: Not Currently  Other Topics Concern   Not on file  Social History Narrative   Not on file   Social Determinants of Health   Financial Resource Strain: Not on file  Food Insecurity: Not on file  Transportation Needs: Not on file  Physical Activity: Not on file  Stress: Not on file  Social Connections: Not on file    ECOG Status: 1 - Symptomatic but completely ambulatory  Review of Systems  Constitutional:  Negative for activity change and unexpected weight change.  Musculoskeletal: Negative.     Physical Exam No direct physical  exam was performed Vital Signs: There were no vitals taken for this visit.  Imaging: MR Abdomen Wo Contrast  Result Date: 10/22/2022 CLINICAL DATA:  Surveillance of hepatocellular carcinoma status post liver directed therapy, most recently bland embolization of the main left hepatic arteries on 07/23/2019 EXAM: MRI ABDOMEN WITHOUT CONTRAST TECHNIQUE: Multiplanar multisequence MR imaging was performed without the administration of intravenous contrast. Please note, this examination was performed without contrast at physician's request. COMPARISON:  MRI abdomen dated 04/09/2022 FINDINGS: Lower chest: No acute findings. Hepatobiliary: Multifocal hepatic lesions are again seen, some increased in size, and some decreased in size, for example: -4.4 x 3.6 cm segment 4 (10:17), increased from 3.6 x 3.3 (when measured similarly) -1.9 x 1.7 cm segment 6 (10:35), increased from 1.2 x 0.8 cm -1.8 x 1.5 cm segment 2 (10:24), decreased from 3.1 x 2.7 cm -2.7 x 2.2 cm peripheral segment 2 (8:10), decreased from 3.3 x 2.9 cm when measured on the diffusion-weighted sequence A 6 mm focus of restricted diffusion in segment 6 (8:14) and 7 mm focus in segment 4 (8:7) appear new. s Multifocal capsular retraction in keeping with known hepatocellular carcinomas and posttreatment changes. New ovoid focus of T2 hyperintensity within the anterior segment 2 (7:13), likely posttreatment changes. No bile duct dilation. Cholelithiasis. Pancreas: No mass, inflammatory changes, or other parenchymal abnormality identified. Spleen:  Within normal limits in size and appearance. Adrenals/Urinary Tract: No adrenal nodules. No suspicious renal masses identified. No evidence of hydronephrosis. Stomach/Bowel: Visualized portions within the abdomen are unremarkable. Vascular/Lymphatic: No pathologically enlarged lymph nodes identified. No abdominal aortic aneurysm demonstrated. Other:  None. Musculoskeletal: No suspicious bone lesions identified.  IMPRESSION: 1. Multifocal hepatic lesions, in keeping with known hepatocellular carcinomas, some of which have increased in size and some of which have decreased. 2. Two subcentimeter foci of restricted diffusion in segment 6 and segment 4 appear new and may reflect new foci of hepatocellular carcinoma. Recommend attention on follow-up. Electronically Signed   By: Darrin Nipper M.D.   On: 10/22/2022 16:27    Labs:  CBC: Recent Labs    07/03/22 0813 07/22/22 0812 09/14/22 1203 10/20/22 1229  WBC 6.6 5.3 5.0 4.8  HGB 12.8* 11.9* 12.2* 13.1  HCT 41.3 40.2 39.9 42.2  PLT 279 218 182 165    COAGS: Recent Labs    01/23/22 0808 02/05/22 0811 06/19/22 0800 07/22/22 0812  INR 1.0 1.0 1.0 1.1    BMP: Recent Labs    07/03/22 0813 07/22/22 0812 09/14/22 1203 10/20/22 1229  NA 140 140 138 140  K 4.8 4.4 4.4 4.4  CL 104 111 107 105  CO2 28 21* 23 28  GLUCOSE 107* 100* 103* 96  BUN 35* 32* 35* 27*  CALCIUM 9.5 8.6* 9.4 8.7*  CREATININE 2.40* 2.18* 2.26* 2.14*  GFRNONAA 27* 31* 30* 31*    LIVER FUNCTION TESTS: Recent Labs    07/03/22 0813 07/22/22 0812 09/14/22 1203 10/20/22 1229  BILITOT 0.6 0.5 0.5 0.6  AST 33 33 41 41  ALT 27 22 20 19   ALKPHOS 143* 109 97 81  PROT 8.6* 8.1 7.6 7.6  ALBUMIN 3.7 3.5 3.7 3.9    TUMOR MARKERS: No results for input(s): "AFPTM", "CEA", "CA199", "CHROMGRNA" in the last 8760 hours.  Assessment and Plan:  77 year old with multifocal hepatocellular carcinoma and he has undergone multiple liver directed therapies, most recently he had bland embolization to the left hepatic lobe  on 07/22/2022.  Despite multiple liver directed therapies and still taking lenvatinib, there is evidence for disease progression based on MRI and AFP level.  With regards to liver directed therapy, the patient has had ablations, Y90 radioembolization, chemoembolization and bland embolization to the liver.  At this point, we could continue with chemoembolization or bland  embolization throughout the liver and I believe that he could still have another Y90 dose to the right hepatic lobe.  The patient's hepatic anatomy is complex in that he had a accessory left hepatic artery that was feeding a large amount of the left hepatic disease but that vessel was occluded at his last angiogram possibly secondary to catheter related dissection.  Patient's renal insufficiency causes a diagnostic problem because it is difficult to adequately map out his hepatic anatomy without giving him a large dose of iodinated contrast.  Fortunately, his renal function appears to be stable and he continues to have normal liver function. The last MRI was done without contrast and this limits evaluation.  I believe Mr. Pitcher would benefit from additional liver directed therapies.  At this time, the bulk of the disease continues to be in the left hepatic lobe or segment 4 region.  Patient would need additional angiography to see if we can target the right hepatic arteries to treat the segment 4 disease.  I think we could perform both Y90 radioembolization and/or bland/chemoembolization to the right hepatic lobe.  Due to previous radiation treatments in the left hepatic lobe, I would limit the left hepatic treatment to either bland embolization chemoembolization.  I discussed these treatment options with Mr. Bendon and he would like to follow-up with Dr. Irene Limbo to discuss additional systemic therapies before we schedule another catheter directed procedure.  In the future, we will try to make sure that the patient gets MRI with and without contrast.  Will make a decision about liver directed therapy after the patient follows up with his oncologist.    Electronically Signed: Burman Riis 11/10/2022, 8:32 AM   I spent a total of    10 Minutes in remote  clinical consultation, greater than 50% of which was counseling/coordinating care for hepatocellular carcinoma.  Total time included review of chart and  imaging.    Visit type: Audio only (telephone). Audio (no video) only due to patient preference. Alternative for in-person consultation at [facility], [address], [place], [place].

## 2022-12-01 ENCOUNTER — Other Ambulatory Visit (HOSPITAL_COMMUNITY): Payer: Self-pay

## 2022-12-04 ENCOUNTER — Other Ambulatory Visit (HOSPITAL_COMMUNITY): Payer: Self-pay

## 2022-12-08 ENCOUNTER — Other Ambulatory Visit: Payer: Self-pay

## 2022-12-18 ENCOUNTER — Other Ambulatory Visit: Payer: Medicare HMO

## 2022-12-18 ENCOUNTER — Other Ambulatory Visit: Payer: Self-pay

## 2022-12-18 ENCOUNTER — Inpatient Hospital Stay: Payer: Medicare HMO | Attending: Hematology | Admitting: Hematology

## 2022-12-18 ENCOUNTER — Inpatient Hospital Stay: Payer: Medicare HMO

## 2022-12-18 VITALS — BP 149/90 | HR 64 | Temp 97.5°F | Resp 20 | Wt 200.3 lb

## 2022-12-18 DIAGNOSIS — C220 Liver cell carcinoma: Secondary | ICD-10-CM

## 2022-12-18 DIAGNOSIS — Z08 Encounter for follow-up examination after completed treatment for malignant neoplasm: Secondary | ICD-10-CM | POA: Diagnosis not present

## 2022-12-18 DIAGNOSIS — D472 Monoclonal gammopathy: Secondary | ICD-10-CM | POA: Diagnosis not present

## 2022-12-18 DIAGNOSIS — N189 Chronic kidney disease, unspecified: Secondary | ICD-10-CM | POA: Insufficient documentation

## 2022-12-18 DIAGNOSIS — Z7189 Other specified counseling: Secondary | ICD-10-CM | POA: Diagnosis not present

## 2022-12-18 DIAGNOSIS — Z8505 Personal history of malignant neoplasm of liver: Secondary | ICD-10-CM | POA: Diagnosis not present

## 2022-12-18 LAB — CBC WITH DIFFERENTIAL (CANCER CENTER ONLY)
Abs Immature Granulocytes: 0.02 10*3/uL (ref 0.00–0.07)
Basophils Absolute: 0 10*3/uL (ref 0.0–0.1)
Basophils Relative: 1 %
Eosinophils Absolute: 0.1 10*3/uL (ref 0.0–0.5)
Eosinophils Relative: 2 %
HCT: 47.1 % (ref 39.0–52.0)
Hemoglobin: 14.4 g/dL (ref 13.0–17.0)
Immature Granulocytes: 1 %
Lymphocytes Relative: 23 %
Lymphs Abs: 1 10*3/uL (ref 0.7–4.0)
MCH: 23.4 pg — ABNORMAL LOW (ref 26.0–34.0)
MCHC: 30.6 g/dL (ref 30.0–36.0)
MCV: 76.6 fL — ABNORMAL LOW (ref 80.0–100.0)
Monocytes Absolute: 0.5 10*3/uL (ref 0.1–1.0)
Monocytes Relative: 12 %
Neutro Abs: 2.6 10*3/uL (ref 1.7–7.7)
Neutrophils Relative %: 61 %
Platelet Count: 140 10*3/uL — ABNORMAL LOW (ref 150–400)
RBC: 6.15 MIL/uL — ABNORMAL HIGH (ref 4.22–5.81)
RDW: 16.1 % — ABNORMAL HIGH (ref 11.5–15.5)
WBC Count: 4.2 10*3/uL (ref 4.0–10.5)
nRBC: 0 % (ref 0.0–0.2)

## 2022-12-18 LAB — CMP (CANCER CENTER ONLY)
ALT: 23 U/L (ref 0–44)
AST: 51 U/L — ABNORMAL HIGH (ref 15–41)
Albumin: 4 g/dL (ref 3.5–5.0)
Alkaline Phosphatase: 109 U/L (ref 38–126)
Anion gap: 7 (ref 5–15)
BUN: 24 mg/dL — ABNORMAL HIGH (ref 8–23)
CO2: 26 mmol/L (ref 22–32)
Calcium: 9.4 mg/dL (ref 8.9–10.3)
Chloride: 106 mmol/L (ref 98–111)
Creatinine: 2.07 mg/dL — ABNORMAL HIGH (ref 0.61–1.24)
GFR, Estimated: 33 mL/min — ABNORMAL LOW (ref >60)
Glucose, Bld: 104 mg/dL — ABNORMAL HIGH (ref 70–99)
Potassium: 4.4 mmol/L (ref 3.5–5.1)
Sodium: 139 mmol/L (ref 135–145)
Total Bilirubin: 0.5 mg/dL (ref 0.3–1.2)
Total Protein: 7.5 g/dL (ref 6.5–8.1)

## 2022-12-18 MED ORDER — AMLODIPINE BESYLATE 10 MG PO TABS
10.0000 mg | ORAL_TABLET | Freq: Every day | ORAL | 1 refills | Status: DC
Start: 2022-12-18 — End: 2023-06-21

## 2022-12-18 NOTE — Progress Notes (Signed)
HEMATOLOGY/ONCOLOGY CLINIC NOTE  Date of Service: 12/18/22  Patient Care Team: Irena Reichmann, DO as PCP - General (Family Medicine)  CHIEF COMPLAINTS/PURPOSE OF CONSULTATION:  Follow-up for continued evaluation and management of hepatocellular carcinoma.  HISTORY OF PRESENTING ILLNESS:  Please see previous note for details on initial presentation  INTERVAL HISTORY:  Aaron [name] is a 77 y.o. male here for follow-up for HCC. Patient was last seen by me on 09/14/2022 and complained of consistent lateral left leg pain, but was otherwise doing well overall with no new medical concerns.  Today, he complains of a loss of appetite, reporting that he eats half as much as usual. Patient has lost 70 pounds since his last visit with [place] on 09/14/2022 and currently weighs 200 pounds. He denies any abdominal pain in general but does note one episode lasting a few seconds. He reports that that he will need an Amlodipine refill.   He denies any new SOB, mouth sores or chest pain. He complains of poor energy levels and does report diarrhea on one or two occasions.    MEDICAL HISTORY:  Past Medical History:  Diagnosis Date   Arthritis    Diabetes mellitus without complication (HCC)    type II    Dyspnea    Elevated liver enzymes    Fatty liver    Hepatitis C    genotype 1A status post treatment with Heloise Purpura and Ribavarin for 24 weeks   Hepatocellular carcinoma (HCC) 01/30/2014   Path   History of colon polyps    Hyperlipidemia    Hypertension    Iron deficiency anemia    hx of    MGUS (monoclonal gammopathy of unknown significance)    Obesity Hepatocellular carcinoma treated with TACE and percutaneous thermal ablation by interventional radiology. Last MRI on 08/06/2015 showed slight decrease in the size of the ablation defect involving the right lobe of the liver area and no findings to suggest residual or recurrent hepatocellular carcinoma. Mild changes of liver cirrhosis. MRI Abd  06/24/2016- no evidence of recurrent HCC   Hepatitis C genotype 1A status post treatment with Viekira and Ribavarin for 24 weeks.  Monoclonal gammopathy of undetermined significance.  SURGICAL HISTORY:  Status post microwave ablation[October 2015] of liver lesion and TACE [July 2015] for HCC. EGD and colonoscopy in 2016   SOCIAL HISTORY: Social History   Socioeconomic History   Marital status: Married    Spouse name: Not on file   Number of children: Not on file   Years of education: Not on file   Highest education level: Not on file  Occupational History   Not on file  Tobacco Use   Smoking status: Former    Types: Cigarettes    Quit date: 01/30/1994    Years since quitting: 28.9   Smokeless tobacco: Never   Tobacco comments:    stopped 25 yrs ago  Vaping Use   Vaping Use: Never used  Substance and Sexual Activity   Alcohol use: No    Comment: stopped 30 years ago    Drug use: No   Sexual activity: Not Currently  Other Topics Concern   Not on file  Social History Narrative   Not on file   Social Determinants of Health   Financial Resource Strain: Not on file  Food Insecurity: Not on file  Transportation Needs: Not on file  Physical Activity: Not on file  Stress: Not on file  Social Connections: Not on file  Intimate Partner Violence:  Not on file  Former smoker and smoked 1 pack per day for about 20 years starting at age 26 quit 30 years ago.  FAMILY HISTORY: No family history on file.  ALLERGIES:  has No Known Allergies.  MEDICATIONS:  Current Outpatient Medications  Medication Sig Dispense Refill   ACCU-CHEK AVIVA PLUS test strip      Accu-Chek Softclix Lancets lancets      amLODipine (NORVASC) 10 MG tablet TAKE 1 TABLET BY MOUTH EVERY DAY 90 tablet 1   aspirin EC 81 MG tablet Take 81 mg by mouth every morning.      cholecalciferol (VITAMIN D) 1000 UNITS tablet Take 1,000 Units by mouth every morning.      glipiZIDE (GLUCOTROL XL) 2.5 MG 24 hr tablet  Take 2.5 mg by mouth daily with breakfast.     Insulin Glargine (LANTUS SOLOSTAR) 100 UNIT/ML Solostar Pen Inject 32 Units into the skin daily.     lenvatinib 12 mg daily dose (LENVIMA) 3 x 4 MG capsule TAKE 3 CAPSULES (12MG ) BY MOUTH DAILY. 90 each 2   Metoprolol Succinate 50 MG CS24 1 capsule     Multiple Vitamin (MULTIVITAMIN WITH MINERALS) TABS tablet Take 1 tablet daily by mouth.     oxyCODONE (OXY IR/ROXICODONE) 5 MG immediate release tablet Take 1 tablet (5 mg total) by mouth every 6 (six) hours as needed for severe pain. 30 tablet 0   PAZEO 0.7 % SOLN Place 1 drop into both eyes every morning.  3   senna-docusate (SENNA S) 8.6-50 MG tablet Take 2 tablets by mouth at bedtime as needed for mild constipation or moderate constipation. 60 tablet 1   simvastatin (ZOCOR) 20 MG tablet Take 20 mg by mouth daily.     No current facility-administered medications for this visit.   Facility-Administered Medications Ordered in Other Visits  Medication Dose Route Frequency Provider Last Rate Last Admin   dexamethasone (DECADRON) injection 8 mg  8 mg Intravenous Once Alex Gardener T, NP       ondansetron [facility]) 8 mg in sodium chloride 0.9 % 50 mL IVPB  8 mg Intravenous Once Alex Gardener T, NP       pantoprazole (PROTONIX) injection 40 mg  40 mg Intravenous Once Shon Hough, NP        REVIEW OF SYSTEMS:    10 Point review of Systems was done is negative except as noted above.   PHYSICAL EXAMINATION: Vitals:   12/18/22 1335  BP: (!) 149/90  Pulse: 64  Resp: 20  Temp: (!) 97.5 F (36.4 C)  SpO2: 100%   GENERAL:alert, in no acute distress and comfortable SKIN: no acute rashes, no significant lesions EYES: conjunctiva are pink and non-injected, sclera anicteric OROPHARYNX: MMM, no exudates, no oropharyngeal erythema or ulceration NECK: supple, no JVD LYMPH:  no palpable lymphadenopathy in the cervical, axillary or inguinal regions LUNGS: clear to auscultation b/l with normal respiratory  effort HEART: regular rate & rhythm ABDOMEN:  normoactive bowel sounds , non tender, not distended. Extremity: no pedal edema PSYCH: alert & oriented x 3 with fluent speech NEURO: no focal motor/sensory deficits    LABORATORY DATA:  I have reviewed the data as listed     Latest Ref Rng & Units 12/18/2022    2:21 PM 10/20/2022   12:29 PM 09/14/2022   12:03 PM  CBC  WBC 4.0 - 10.5 K/uL 4.2  4.8  5.0   Hemoglobin 13.0 - 17.0 g/dL 16.1  09.6  04.5  Hematocrit 39.0 - 52.0 % 47.1  42.2  39.9   Platelets 150 - 400 K/uL 140  165  182     CBC    Component Value Date/Time   WBC 4.8 10/20/2022 1229   WBC 5.3 07/22/2022 0812   RBC 5.49 10/20/2022 1229   HGB 13.1 10/20/2022 1229   HGB 11.5 (L) 03/22/2017 1402   HCT 42.2 10/20/2022 1229   HCT 37.0 (L) 03/22/2017 1402   PLT 165 10/20/2022 1229   PLT 262 03/22/2017 1402   MCV 76.9 (L) 10/20/2022 1229   MCV 72.3 (L) 03/22/2017 1402   MCH 23.9 (L) 10/20/2022 1229   MCHC 31.0 10/20/2022 1229   RDW 16.3 (H) 10/20/2022 1229   RDW 15.6 (H) 03/22/2017 1402   LYMPHSABS 1.4 10/20/2022 1229   LYMPHSABS 1.8 03/22/2017 1402   MONOABS 0.5 10/20/2022 1229   MONOABS 0.4 03/22/2017 1402   EOSABS 0.1 10/20/2022 1229   EOSABS 0.1 03/22/2017 1402   BASOSABS 0.0 10/20/2022 1229   BASOSABS 0.0 03/22/2017 1402    .    Latest Ref Rng & Units 12/18/2022    2:21 PM 10/20/2022   12:29 PM 09/14/2022   12:03 PM  CMP  Glucose 70 - 99 mg/dL 161  96  096   BUN 8 - 23 mg/dL 24  27  35   Creatinine 0.61 - 1.24 mg/dL 0.45  4.09  8.11   Sodium 135 - 145 mmol/L 139  140  138   Potassium 3.5 - 5.1 mmol/L 4.4  4.4  4.4   Chloride 98 - 111 mmol/L 106  105  107   CO2 22 - 32 mmol/L 26  28  23    Calcium 8.9 - 10.3 mg/dL 9.4  8.7  9.4   Total Protein 6.5 - 8.1 g/dL 7.5  7.6  7.6   Total Bilirubin 0.3 - 1.2 mg/dL 0.5  0.6  0.5   Alkaline Phos 38 - 126 U/L 109  81  97   AST 15 - 41 U/L 51  41  41   ALT 0 - 44 U/L 23  19  20        06/21/2019 MR ABDOMEN WWO  CONTRAST (Accession [id number])     IFE 1  Comment    Comments: Immunofixation shows IgG monoclonal protein with kappa light chain  specificity.           RADIOGRAPHIC STUDIES:  MRI abd w and wo contrast: 12/17/2016: IMPRESSION: 1. Postprocedural changes of thermal ablation again noted in the right lobe of the liver, without definitive evidence to suggest local recurrence of disease. No new hepatic lesions are noted. 2. Additional incidental findings, as above.     Electronically Signed   By: Trudie Reed M.D.   On: 12/17/2016 09:42  MRI abd w and wo contrast 01/26/2020 IMPRESSION: 1. Interval enlargement of multiple hepatic masses as detailed above, consistent with progression of multifocal hepatocellular carcinoma. There is evidence of prior ablation in hepatic segments VII and VIII. 2. No evidence of metastatic disease in the abdomen.   Electronically Signed   By: Lauralyn Primes M.D.   On: 01/27/2020 15:51  MRI abd and wo contrast 10/04/2021 FINDINGS: Lower chest: Unremarkable   Hepatobiliary: Multiple liver lesions are present, some of these represent previously treated tumors and some represent active enhancing malignancy.   An index solid enhancing lesion in the lateral segment left hepatic lobe measures 3.4 by 2.8 cm on image 15 series 2, formerly 3.5 by  2.7 cm, essentially stable. Just below this a T2 hyperintense lesion measuring 1.5 by 1.1 cm on image 18 of series 2 previously measured 1.4 by 1.2 cm, likewise stable.   A currently enhancing lesion in the right hepatic lobe adjacent to the IVC measures 1.8 by 1.6 cm on image 13 series 2, stable.   A mostly centrally necrotic a 3.6 by 3.1 cm mostly centrally necrotic lesion in segment 4a of the liver on image 29 of series 21 previously measured 4.3 by 3.0 cm. This lesion is mostly centrally necrotic today although with some nodular enhancement along its margin as on image 29 series 21.   Previously  treated right hepatic lobe lesions are observed, and mostly nonenhancing or poorly enhancing.   A somewhat indistinctly marginated lesion posteriorly in segment 4a of the liver is diffusely enhancing and measures about 2.9 by 2.8 cm on image 28 of series 21. A previous faintly T2 hyperintense lesion in this vicinity on 02/20/2021 measured 2.0 by 1.6 cm, and accordingly this represents enlargement of this enhancing mass.   An enhancing mass laterally in segment 3 of the liver measures 3.0 by 2.6 cm on image 31 series 8, formerly 2.9 by 2.6 cm, essentially stable.   No biliary dilatation is observed.   Pancreas:  Unremarkable   Spleen:  Unremarkable   Adrenals/Urinary Tract:  Unremarkable   Stomach/Bowel: Unremarkable   Vascular/Lymphatic: Atherosclerosis is present, including aortoiliac atherosclerotic disease.   Other:  No supplemental non-categorized findings.   Musculoskeletal: Unchanged mild lower lumbar spondylosis and degenerative disc disease.   IMPRESSION: 1. Mixed appearance, with some of the enhancing liver lesions stable; some improved such as the centrally necrotic anterior segment 4A lesion which demonstrates substantial central necrosis compared to previous; but with at least 1 worsened lesion which is the posterior segment 4a lesion (image 29, series 20), moderately increased in size. 2. Aortic Atherosclerosis (ICD10-I70.0). 3. Mild lower lumbar spondylosis and degenerative disc disease.  Electronically Signed   By: Gaylyn Rong M.D.   On: 10/06/2021 11:08  ASSESSMENT & PLAN:   77 y.o. male with  #1 Monoclonal gammopathy of undetermined significance.  M protein 0.2 mg/dL immunofixation showing IgG monoclonal protein with kappa light chain specificity. SPEP 02/2016 - 0.3g/dl  No overt anemia.  No overt hypercalcemia. Stable CKD creatinine 1.6  #2 bone lucencies in the right radial mid midshaft and left humeral head. PET/CT did not show any  hypermetabolic bone lesions. MRI left shoulder showed no evidence of metastatic disease or multiple myeloma. The x-ray findings appear to be areas of mild demineralization. Patient was seen by orthopedics and no additional recommendations were given.  Plan -Patient's anemia is stable. No significant change in renal function. No new bone pains. No hypercalcemia . No overall no overt evidence of progression of multiple myeloma. -Myeloma labs from today show stable M protein @ 0.2g/dl with no evidence of progression- consistent with MGUS.  #3 Hepatocellular carcinoma treated with TACE and percutaneous thermal ablation , Y 90 and recent bland arterial embolization on 06/06/2021 by interventional radiology.   Patient had bland embolization on 02/05/2022 for his hepatocellular carcinoma.   Last MRI on 08/06/2015 showed slight decrease in the size of the ablation defect involving the right lobe of the liver area and no findings to suggest residual or recurrent hepatocellular carcinoma. Mild changes of liver cirrhosis.  01/16/2016 MRI Abdomen showed resolution of previously ablated lesion but a new 1.3 cm lesion was noted which was subsequently ablated under  CT guidance by interventional radiology on 01/31/2016. Elevated transaminases on 02/01/2016 were likely related to his ablation. These have since resolved.   06/24/2016 MRI Abdomen showed no evidence of recurrent hepatocellular carcinoma . 12/17/2016 MRI Abdomen shows no evidence of recurrent hepatocellular carcinoma .  10/26/17 MRI Abdomen revealed Motion degraded images. Status post thermal ablation of the segment 8 lesion, without enhancement to suggest residual viable tumor. Prior ablation of a segment 7 lesion, grossly unchanged. No convincing central enhancement  03/14/2019  MRI abdomen w and wo contrast revealed "1. New areas of restricted diffusion, surrounding nodular arterial phase enhancement and delayed washout surrounding the ablation zone  defects anteriorly in segments 8 and 7 consistent with local recurrence of hepatocellular carcinoma. 2. The peripheral ablation zone defect laterally in segment 8 is unchanged. 3. No extrahepatic tumor identified."  10/02/2019 MRI Abdomen ([id number]) revealed "1. Progressive multifocal hepatocellular carcinoma, especially anteriorly around the ablated lesion in segments 4B and 8. There are additional new lesions in segments 2 and 3. No evidence of extrahepatic metastatic disease."  01/26/2020 MRI Abdomen ([id number]) revealed "1. Interval enlargement of multiple hepatic masses as detailed above, consistent with progression of multifocal hepatocellular carcinoma. There is evidence of prior ablation in hepatic segments VII and VIII. 2. No evidence of metastatic disease in the abdomen."  10/04/2021 MRI Abdomen ([id number]) revealed "1. Mixed appearance, with some of the enhancing liver lesions stable; some improved such as the centrally necrotic anterior segment 4A lesion which demonstrates substantial central necrosis compared to previous; but with at least 1 worsened lesion which is the posterior segment 4a lesion (image 29, series 20), moderately increased in size."  04/09/2022 MRI Abdomen ([id number]) revealed "1. Again noted are multiple lesions involving both lobes of liver compatible with multifocal hepatocellular carcinoma. When compared with the previous exam there no significant change in the overall tumor volume from 10/04/2021. 2. Interval treatment response with hypoenhancement of lesion within the segment 3 and the small lesion within dome of segment 2. 3. The remaining lesions continue to exhibit variable degrees of internal enhancement. 4. There is a new enhancing lesion within the dome of liver (segment 8/4a). 5. Aortic Atherosclerosis"   #4 Hepatitis C genotype 1A status post treatment with Viekira and Ribavarin for 24 weeks.   #5 Microcytosis with Minimal Anemia - Hgb electrophoresis  suggestive of thal trait given relative polycythemia.    PLAN:  -patient is here for f/u of his HCC..he missed his last appointment and is here for delayed f/u. -patient's most recent labs and imaging did show concern for progression of hepatocellular carcinoma -last MRI 10/22/2022 showed increased multifocal hepatic lesions -Patient did see Dr. Lowella Dandy March 2024, but patient wanted to hold off on additional hepatic interventions -informed patient that systemic therapies would not be very effective with liver cancer. Discussed details of possible side effects -patient does endorse weight loss, loss of appetite, and fatigue symptoms at this time -discussed proceeding options such as: At-home comfort care through hospice to control symptoms. Discussed that new treatment may not be too effective and may cause side effects. Proceed with new medication as part of active treatment. Patient would need to optimize water intake and nutrition. New medication may worsen symptoms. Interventional systemic therapies with Dr. Lowella Dandy -patient would like to take some time to discuss options with his wife before making a proceeding decision -order blood tests Labs done today reviewed -- significant further elevation of AFP tumor markers -will refill Amlodipine 10 MG -discussed option of medical transportation  in the event that he may need resources to present to [facility]  FOLLOW-UP: Labs today Phone visit with Dr Candise Che in 1 week  The total time spent in the appointment was 30 minutes* .  All of the patient's questions were answered with apparent satisfaction. The patient knows to call the clinic with any problems, questions or concerns.   Wyvonnia Lora MD MS AAHIVMS [facility] Hematology/Oncology Physician [facility]  .*Total Encounter Time as defined by the Centers for Medicare and Medicaid Services includes, in addition to the face-to-face time of a patient visit (documented in the note above)  non-face-to-face time: obtaining and reviewing outside history, ordering and reviewing medications, tests or procedures, care coordination (communications with other health care professionals or caregivers) and documentation in the medical record.    I,Mitra Faeizi,acting as a Neurosurgeon for Wyvonnia Lora, MD.,have documented all relevant documentation on the behalf of Wyvonnia Lora, MD,as directed by  Wyvonnia Lora, MD while in the presence of Wyvonnia Lora, MD.  .I have reviewed the above documentation for accuracy and completeness, and I agree with the above. Johney Maine MD

## 2022-12-20 LAB — AFP TUMOR MARKER: AFP, Serum, Tumor Marker: 7157 ng/mL — ABNORMAL HIGH (ref 0.0–8.4)

## 2022-12-21 ENCOUNTER — Telehealth: Payer: Self-pay | Admitting: Hematology

## 2022-12-28 ENCOUNTER — Inpatient Hospital Stay: Payer: Medicare HMO | Attending: Hematology | Admitting: Hematology

## 2022-12-28 DIAGNOSIS — Z7189 Other specified counseling: Secondary | ICD-10-CM | POA: Diagnosis not present

## 2022-12-28 DIAGNOSIS — C220 Liver cell carcinoma: Secondary | ICD-10-CM | POA: Diagnosis not present

## 2022-12-28 DIAGNOSIS — Z8505 Personal history of malignant neoplasm of liver: Secondary | ICD-10-CM | POA: Insufficient documentation

## 2022-12-28 DIAGNOSIS — D649 Anemia, unspecified: Secondary | ICD-10-CM | POA: Insufficient documentation

## 2022-12-28 DIAGNOSIS — D472 Monoclonal gammopathy: Secondary | ICD-10-CM | POA: Insufficient documentation

## 2022-12-28 DIAGNOSIS — Z08 Encounter for follow-up examination after completed treatment for malignant neoplasm: Secondary | ICD-10-CM | POA: Insufficient documentation

## 2022-12-28 DIAGNOSIS — B1920 Unspecified viral hepatitis C without hepatic coma: Secondary | ICD-10-CM | POA: Insufficient documentation

## 2022-12-28 NOTE — Progress Notes (Signed)
HEMATOLOGY/ONCOLOGY CLINIC NOTE  Date of Service: 12/28/22  Patient Care Team: Irena Reichmann, DO as PCP - General (Family Medicine) Johney Maine, MD as Consulting Physician (Hematology)  CHIEF COMPLAINTS/PURPOSE OF CONSULTATION:  Follow-up for continued evaluation and management of hepatocellular carcinoma.  HISTORY OF PRESENTING ILLNESS:  Please see previous note for details on initial presentation  INTERVAL HISTORY:  Aaron [name] is a 77 y.o. male who is connected via phone for continued evaluation and management of HCC. Patient was last seen by me on 12/18/2022 and he complained of appetite loss, weight loss, diarrhea, and lethargy.   .I connected with Aaron [name] on 12/28/2022 at  3:30 PM EDT by telephone visit and verified that I am speaking with the correct person using two identifiers.   Patient reports he has been doing fairly well since our last visit. He denies fever, chills, night sweats, infection issues, back pain, or leg swelling.   Discussed the patient's lab results in detail with the patient and his wife. Discussed the next options.   I discussed the limitations, risks, security and privacy concerns of performing an evaluation and management service by telemedicine and the availability of in-person appointments. I also discussed with the patient that there may be a patient responsible charge related to this service. The patient expressed understanding and agreed to proceed.   Other persons participating in the visit and their role in the encounter: Patient's wife    Patient's location: Home  Provider's location: [facility]   Chief Complaint: [facility]     MEDICAL HISTORY:  Past Medical History:  Diagnosis Date   Arthritis    Diabetes mellitus without complication (HCC)    type II    Dyspnea    Elevated liver enzymes    Fatty liver    Hepatitis C    genotype 1A status post treatment with Heloise Purpura and Ribavarin for 24 weeks   Hepatocellular carcinoma  (HCC) 01/30/2014   Path   History of colon polyps    Hyperlipidemia    Hypertension    Iron deficiency anemia    hx of    MGUS (monoclonal gammopathy of unknown significance)    Obesity Hepatocellular carcinoma treated with TACE and percutaneous thermal ablation by interventional radiology. Last MRI on 08/06/2015 showed slight decrease in the size of the ablation defect involving the right lobe of the liver area and no findings to suggest residual or recurrent hepatocellular carcinoma. Mild changes of liver cirrhosis. MRI Abd 06/24/2016- no evidence of recurrent HCC   Hepatitis C genotype 1A status post treatment with Viekira and Ribavarin for 24 weeks.  Monoclonal gammopathy of undetermined significance.  SURGICAL HISTORY:  Status post microwave ablation[October 2015] of liver lesion and TACE [July 2015] for HCC. EGD and colonoscopy in 2016   SOCIAL HISTORY: Social History   Socioeconomic History   Marital status: Married    Spouse name: Not on file   Number of children: Not on file   Years of education: Not on file   Highest education level: Not on file  Occupational History   Not on file  Tobacco Use   Smoking status: Former    Types: Cigarettes    Quit date: 01/30/1994    Years since quitting: 28.9   Smokeless tobacco: Never   Tobacco comments:    stopped 25 yrs ago  Vaping Use   Vaping Use: Never used  Substance and Sexual Activity   Alcohol use: No    Comment: stopped 30 years  ago    Drug use: No   Sexual activity: Not Currently  Other Topics Concern   Not on file  Social History Narrative   Not on file   Social Determinants of Health   Financial Resource Strain: Not on file  Food Insecurity: Not on file  Transportation Needs: Not on file  Physical Activity: Not on file  Stress: Not on file  Social Connections: Not on file  Intimate Partner Violence: Not on file  Former smoker and smoked 1 pack per day for about 20 years starting at age 89 quit 30  years ago.  FAMILY HISTORY: No family history on file.  ALLERGIES:  has No Known Allergies.  MEDICATIONS:  Current Outpatient Medications  Medication Sig Dispense Refill   ACCU-CHEK AVIVA PLUS test strip      Accu-Chek Softclix Lancets lancets      amLODipine (NORVASC) 10 MG tablet Take 1 tablet (10 mg total) by mouth daily. 90 tablet 1   aspirin EC 81 MG tablet Take 81 mg by mouth every morning.      cholecalciferol (VITAMIN D) 1000 UNITS tablet Take 1,000 Units by mouth every morning.      glipiZIDE (GLUCOTROL XL) 2.5 MG 24 hr tablet Take 2.5 mg by mouth daily with breakfast.     Insulin Glargine (LANTUS SOLOSTAR) 100 UNIT/ML Solostar Pen Inject 32 Units into the skin daily.     lenvatinib 12 mg daily dose (LENVIMA) 3 x 4 MG capsule TAKE 3 CAPSULES (12MG ) BY MOUTH DAILY. 90 each 2   Metoprolol Succinate 50 MG CS24 1 capsule     Multiple Vitamin (MULTIVITAMIN WITH MINERALS) TABS tablet Take 1 tablet daily by mouth.     oxyCODONE (OXY IR/ROXICODONE) 5 MG immediate release tablet Take 1 tablet (5 mg total) by mouth every 6 (six) hours as needed for severe pain. 30 tablet 0   PAZEO 0.7 % SOLN Place 1 drop into both eyes every morning.  3   senna-docusate (SENNA S) 8.6-50 MG tablet Take 2 tablets by mouth at bedtime as needed for mild constipation or moderate constipation. 60 tablet 1   simvastatin (ZOCOR) 20 MG tablet Take 20 mg by mouth daily.     No current facility-administered medications for this visit.   Facility-Administered Medications Ordered in Other Visits  Medication Dose Route Frequency Provider Last Rate Last Admin   dexamethasone (DECADRON) injection 8 mg  8 mg Intravenous Once Alex Gardener T, NP       ondansetron [facility]) 8 mg in sodium chloride 0.9 % 50 mL IVPB  8 mg Intravenous Once Alex Gardener T, NP       pantoprazole (PROTONIX) injection 40 mg  40 mg Intravenous Once Shon Hough, NP        REVIEW OF SYSTEMS:    10 Point review of Systems was done is negative  except as noted above.   PHYSICAL EXAMINATION: Telemedicine visit  LABORATORY DATA:  I have reviewed the data as listed     Latest Ref Rng & Units 12/18/2022    2:21 PM 10/20/2022   12:29 PM 09/14/2022   12:03 PM  CBC  WBC 4.0 - 10.5 K/uL 4.2  4.8  5.0   Hemoglobin 13.0 - 17.0 g/dL 16.1  09.6  04.5   Hematocrit 39.0 - 52.0 % 47.1  42.2  39.9   Platelets 150 - 400 K/uL 140  165  182     CBC    Component Value Date/Time  WBC 4.2 12/18/2022 1421   WBC 5.3 07/22/2022 0812   RBC 6.15 (H) 12/18/2022 1421   HGB 14.4 12/18/2022 1421   HGB 11.5 (L) 03/22/2017 1402   HCT 47.1 12/18/2022 1421   HCT 37.0 (L) 03/22/2017 1402   PLT 140 (L) 12/18/2022 1421   PLT 262 03/22/2017 1402   MCV 76.6 (L) 12/18/2022 1421   MCV 72.3 (L) 03/22/2017 1402   MCH 23.4 (L) 12/18/2022 1421   MCHC 30.6 12/18/2022 1421   RDW 16.1 (H) 12/18/2022 1421   RDW 15.6 (H) 03/22/2017 1402   LYMPHSABS 1.0 12/18/2022 1421   LYMPHSABS 1.8 03/22/2017 1402   MONOABS 0.5 12/18/2022 1421   MONOABS 0.4 03/22/2017 1402   EOSABS 0.1 12/18/2022 1421   EOSABS 0.1 03/22/2017 1402   BASOSABS 0.0 12/18/2022 1421   BASOSABS 0.0 03/22/2017 1402    .    Latest Ref Rng & Units 12/18/2022    2:21 PM 10/20/2022   12:29 PM 09/14/2022   12:03 PM  CMP  Glucose 70 - 99 mg/dL 161  96  096   BUN 8 - 23 mg/dL 24  27  35   Creatinine 0.61 - 1.24 mg/dL 0.45  4.09  8.11   Sodium 135 - 145 mmol/L 139  140  138   Potassium 3.5 - 5.1 mmol/L 4.4  4.4  4.4   Chloride 98 - 111 mmol/L 106  105  107   CO2 22 - 32 mmol/L 26  28  23    Calcium 8.9 - 10.3 mg/dL 9.4  8.7  9.4   Total Protein 6.5 - 8.1 g/dL 7.5  7.6  7.6   Total Bilirubin 0.3 - 1.2 mg/dL 0.5  0.6  0.5   Alkaline Phos 38 - 126 U/L 109  81  97   AST 15 - 41 U/L 51  41  41   ALT 0 - 44 U/L 23  19  20        06/21/2019 MR ABDOMEN WWO CONTRAST (Accession [id number])     IFE 1  Comment    Comments: Immunofixation shows IgG monoclonal protein with kappa light chain   specificity.           RADIOGRAPHIC STUDIES:  MRI abd w and wo contrast: 12/17/2016: IMPRESSION: 1. Postprocedural changes of thermal ablation again noted in the right lobe of the liver, without definitive evidence to suggest local recurrence of disease. No new hepatic lesions are noted. 2. Additional incidental findings, as above.     Electronically Signed   By: Trudie Reed M.D.   On: 12/17/2016 09:42  MRI abd w and wo contrast 01/26/2020 IMPRESSION: 1. Interval enlargement of multiple hepatic masses as detailed above, consistent with progression of multifocal hepatocellular carcinoma. There is evidence of prior ablation in hepatic segments VII and VIII. 2. No evidence of metastatic disease in the abdomen.   Electronically Signed   By: Lauralyn Primes M.D.   On: 01/27/2020 15:51  MRI abd and wo contrast 10/04/2021 FINDINGS: Lower chest: Unremarkable   Hepatobiliary: Multiple liver lesions are present, some of these represent previously treated tumors and some represent active enhancing malignancy.   An index solid enhancing lesion in the lateral segment left hepatic lobe measures 3.4 by 2.8 cm on image 15 series 2, formerly 3.5 by 2.7 cm, essentially stable. Just below this a T2 hyperintense lesion measuring 1.5 by 1.1 cm on image 18 of series 2 previously measured 1.4 by 1.2 cm, likewise stable.   A  currently enhancing lesion in the right hepatic lobe adjacent to the IVC measures 1.8 by 1.6 cm on image 13 series 2, stable.   A mostly centrally necrotic a 3.6 by 3.1 cm mostly centrally necrotic lesion in segment 4a of the liver on image 29 of series 21 previously measured 4.3 by 3.0 cm. This lesion is mostly centrally necrotic today although with some nodular enhancement along its margin as on image 29 series 21.   Previously treated right hepatic lobe lesions are observed, and mostly nonenhancing or poorly enhancing.   A somewhat indistinctly marginated  lesion posteriorly in segment 4a of the liver is diffusely enhancing and measures about 2.9 by 2.8 cm on image 28 of series 21. A previous faintly T2 hyperintense lesion in this vicinity on 02/20/2021 measured 2.0 by 1.6 cm, and accordingly this represents enlargement of this enhancing mass.   An enhancing mass laterally in segment 3 of the liver measures 3.0 by 2.6 cm on image 31 series 8, formerly 2.9 by 2.6 cm, essentially stable.   No biliary dilatation is observed.   Pancreas:  Unremarkable   Spleen:  Unremarkable   Adrenals/Urinary Tract:  Unremarkable   Stomach/Bowel: Unremarkable   Vascular/Lymphatic: Atherosclerosis is present, including aortoiliac atherosclerotic disease.   Other:  No supplemental non-categorized findings.   Musculoskeletal: Unchanged mild lower lumbar spondylosis and degenerative disc disease.   IMPRESSION: 1. Mixed appearance, with some of the enhancing liver lesions stable; some improved such as the centrally necrotic anterior segment 4A lesion which demonstrates substantial central necrosis compared to previous; but with at least 1 worsened lesion which is the posterior segment 4a lesion (image 29, series 20), moderately increased in size. 2. Aortic Atherosclerosis (ICD10-I70.0). 3. Mild lower lumbar spondylosis and degenerative disc disease.  Electronically Signed   By: Gaylyn Rong M.D.   On: 10/06/2021 11:08  ASSESSMENT & PLAN:   77 y.o. male with  #1 Monoclonal gammopathy of undetermined significance.  M protein 0.2 mg/dL immunofixation showing IgG monoclonal protein with kappa light chain specificity. SPEP 02/2016 - 0.3g/dl  No overt anemia.  No overt hypercalcemia. Stable CKD creatinine 1.6  #2 bone lucencies in the right radial mid midshaft and left humeral head. PET/CT did not show any hypermetabolic bone lesions. MRI left shoulder showed no evidence of metastatic disease or multiple myeloma. The x-ray findings appear to  be areas of mild demineralization. Patient was seen by orthopedics and no additional recommendations were given.  Plan -Patient's anemia is stable. No significant change in renal function. No new bone pains. No hypercalcemia . No overall no overt evidence of progression of multiple myeloma. -Myeloma labs from today show stable M protein @ 0.2g/dl with no evidence of progression- consistent with MGUS.  #3 Hepatocellular carcinoma treated with TACE and percutaneous thermal ablation , Y 90 and recent bland arterial embolization on 06/06/2021 by interventional radiology.   Patient had bland embolization on 02/05/2022 for his hepatocellular carcinoma.   Last MRI on 08/06/2015 showed slight decrease in the size of the ablation defect involving the right lobe of the liver area and no findings to suggest residual or recurrent hepatocellular carcinoma. Mild changes of liver cirrhosis.  01/16/2016 MRI Abdomen showed resolution of previously ablated lesion but a new 1.3 cm lesion was noted which was subsequently ablated under CT guidance by interventional radiology on 01/31/2016. Elevated transaminases on 02/01/2016 were likely related to his ablation. These have since resolved.   06/24/2016 MRI Abdomen showed no evidence of recurrent hepatocellular carcinoma .  12/17/2016 MRI Abdomen shows no evidence of recurrent hepatocellular carcinoma .  10/26/17 MRI Abdomen revealed Motion degraded images. Status post thermal ablation of the segment 8 lesion, without enhancement to suggest residual viable tumor. Prior ablation of a segment 7 lesion, grossly unchanged. No convincing central enhancement  03/14/2019  MRI abdomen w and wo contrast revealed "1. New areas of restricted diffusion, surrounding nodular arterial phase enhancement and delayed washout surrounding the ablation zone defects anteriorly in segments 8 and 7 consistent with local recurrence of hepatocellular carcinoma. 2. The peripheral ablation zone defect  laterally in segment 8 is unchanged. 3. No extrahepatic tumor identified."  10/02/2019 MRI Abdomen ([id number]) revealed "1. Progressive multifocal hepatocellular carcinoma, especially anteriorly around the ablated lesion in segments 4B and 8. There are additional new lesions in segments 2 and 3. No evidence of extrahepatic metastatic disease."  01/26/2020 MRI Abdomen ([id number]) revealed "1. Interval enlargement of multiple hepatic masses as detailed above, consistent with progression of multifocal hepatocellular carcinoma. There is evidence of prior ablation in hepatic segments VII and VIII. 2. No evidence of metastatic disease in the abdomen."  10/04/2021 MRI Abdomen ([id number]) revealed "1. Mixed appearance, with some of the enhancing liver lesions stable; some improved such as the centrally necrotic anterior segment 4A lesion which demonstrates substantial central necrosis compared to previous; but with at least 1 worsened lesion which is the posterior segment 4a lesion (image 29, series 20), moderately increased in size."  04/09/2022 MRI Abdomen ([id number]) revealed "1. Again noted are multiple lesions involving both lobes of liver compatible with multifocal hepatocellular carcinoma. When compared with the previous exam there no significant change in the overall tumor volume from 10/04/2021. 2. Interval treatment response with hypoenhancement of lesion within the segment 3 and the small lesion within dome of segment 2. 3. The remaining lesions continue to exhibit variable degrees of internal enhancement. 4. There is a new enhancing lesion within the dome of liver (segment 8/4a). 5. Aortic Atherosclerosis"   #4 Hepatitis C genotype 1A status post treatment with Viekira and Ribavarin for 24 weeks.   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    PLAN:  -discussed lab results from 12/18/2022 with the patient and his wife. CBC  is stable, but shows decreased Platelets count at 140 K. CMP shows elevated glucose levels at 104, elevated Bun at 24, elevated creatinine at 2.07, and elevated AST at 51.  -Discussed AFP tumor marker results showed elevated AFP tumor marker level at 7,157. Elevated from 2,528 in February. -Lab results shows progression of hepatocellular carcinoma.   -Discussed the next options which includes hospice support or new targeted medication treatment. Discussed that the new medication treatment may not be too effective and cause side effects. Medication: Cabozantinib.  -Educated the patient and his wife on the side effects of Cabozantinib including fatigue, nausea, diarrhea, etc.  -Patient understands the two options.  -Patient wants to try Cabozantinib and see if he tolerates the medication. Patient does not want hospice cares at this time. -Answered all of patient's questions regarding hospice and other treatment.   FOLLOW-UP: RTC with Dr Candise Che with labs in 4 weeks  The total time spent in the appointment was 25 minutes* .  All of the patient's questions were answered with apparent satisfaction. The patient knows to call the clinic with any problems, questions or concerns.   Wyvonnia Lora MD MS AAHIVMS [facility] Hematology/Oncology Physician [facility]  .*Total Encounter Time  as defined by the Centers for Medicare and Medicaid Services includes, in addition to the face-to-face time of a patient visit (documented in the note above) non-face-to-face time: obtaining and reviewing outside history, ordering and reviewing medications, tests or procedures, care coordination (communications with other health care professionals or caregivers) and documentation in the medical record.   I, Ok Edwards, am acting as a Neurosurgeon for Wyvonnia Lora, MD. .I have reviewed the above documentation for accuracy and completeness, and I agree with the above. Johney Maine MD

## 2023-01-04 ENCOUNTER — Other Ambulatory Visit: Payer: Self-pay

## 2023-01-04 ENCOUNTER — Other Ambulatory Visit (HOSPITAL_COMMUNITY): Payer: Self-pay

## 2023-01-04 ENCOUNTER — Telehealth: Payer: Self-pay | Admitting: Pharmacist

## 2023-01-04 ENCOUNTER — Telehealth: Payer: Self-pay | Admitting: Pharmacy Technician

## 2023-01-04 DIAGNOSIS — C220 Liver cell carcinoma: Secondary | ICD-10-CM

## 2023-01-04 MED ORDER — CABOZANTINIB S-MALATE 20 MG PO TABS
40.0000 mg | ORAL_TABLET | Freq: Every day | ORAL | 1 refills | Status: DC
  Filled 2023-01-04: qty 60, 30d supply, fill #0

## 2023-01-04 MED ORDER — CABOMETYX 40 MG PO TABS
40.0000 mg | ORAL_TABLET | Freq: Every day | ORAL | 1 refills | Status: DC
Start: 2023-01-04 — End: 2023-02-23
  Filled 2023-01-04 (×2): qty 30, 30d supply, fill #0
  Filled 2023-01-26: qty 30, 30d supply, fill #1

## 2023-01-04 NOTE — Addendum Note (Signed)
Addended by: Wyvonnia Lora on: 01/04/2023 10:15 AM   Modules accepted: Orders

## 2023-01-04 NOTE — Telephone Encounter (Signed)
Oral Oncology Patient Advocate Encounter  Prior Authorization for Cabometyx has been approved.    PA# [id number] Effective dates: 01/04/23 through 08/24/23  Patients co-pay is $0.    Jinger Neighbors, CPhT-Adv Oncology Pharmacy Patient Advocate [facility] Direct Number: [phone]  Fax: [phone]

## 2023-01-04 NOTE — Telephone Encounter (Signed)
Oral Chemotherapy Pharmacist Encounter  I spoke with patient for overview of: Cabometyx (cabozantinib) for the treatment of metastatic hepatocellular carcinoma, planned duration until disease progression or unacceptable toxicity.   Counseled patient on administration, dosing, side effects, monitoring, drug-food interactions, safe handling, storage, and disposal.  Patient will take Cabometyx 40mg  tablets, 1 tablet (40mg ) by mouth once daily on an empty stomach, 1 hour before or 2 hours after a meal.  Patient endorses taking Lenvima this AM (01/04/23) - he states this will be his last dose of Lenvima and will start Cabometyx on 5/15.  Patient knows to avoid grapefruit and grapefruit juice.  Cabometyx start date: 01/06/23 AM  Adverse effects include but are not limited to: diarrhea, nausea, decreased appetite, fatigue, hypertension, hand-foot syndrome, decreased blood counts, and electrolyte abnormalities. Hand-foot syndrome: discussed use of cream such as Udderly Smooth Extra Care 20 or equivalent advanced care cream that has 20% urea content for advanced skin hydration while on Xeloda Diarrhea: Patient will obtain Imodium (loperamide) to have on hand if they experience diarrhea. Patient knows to alert the office of 4 or more loose stools above baseline.  Patient informed that Cabometyx should be held at least 3 weeks prior to any scheduled surgery (including dental surgery) and not resumed until at least 2 weeks after major surgery and until adequate wound healing is established.  Reviewed with patient importance of keeping a medication schedule and plan for any missed doses. No barriers to medication adherence identified.  Medication reconciliation performed and medication/allergy list updated.  All questions answered.  Mr. Aldana voiced understanding and appreciation.   Medication education handout placed in mail for patient. Patient knows to call the office with questions or concerns.  Oral Chemotherapy Clinic phone number provided to patient.   Lenord Carbo, PharmD, BCPS, BCOP Hematology/Oncology Clinical Pharmacist [facility] Oral Chemotherapy Navigation Clinics [phone] 01/04/2023 11:13 AM

## 2023-01-04 NOTE — Telephone Encounter (Signed)
Oral Oncology Patient Advocate Encounter   Received notification that prior authorization for Cabometyx is required.   PA submitted on 01/04/23 Key [id number] Status is pending     Jinger Neighbors, CPhT-Adv Oncology Pharmacy Patient Advocate [facility] Direct Number: [phone]  Fax: [phone]

## 2023-01-04 NOTE — Telephone Encounter (Signed)
Oral Oncology Pharmacist Encounter  Received new prescription for Cabometyx (cabozantinib) for the treatment of metastatic hepatocellular carcinoma, planned duration until disease progression or unacceptable drug toxicity.  CBC w/ Diff and CMP from 12/18/22 assessed, patient with Scr of 2.07 mg/dL (CrCl ~40 mL/min) - no baseline renal dose adjustments required. Patient starting on reduced dose due to concern for tolerance. Prescription dose and frequency assessed for appropriateness.  Current medication list in Epic reviewed, no relevant/significant DDIs with Cabometyx identified.  Evaluated chart and no patient barriers to medication adherence noted.   Patient agreement for treatment documented in MD note on 12/28/22.  Prescription has been e-scribed to the [facility] for benefits analysis and approval.  Oral Oncology Clinic will continue to follow for insurance authorization, copayment issues, initial counseling and start date.  Lenord Carbo, PharmD, BCPS, [facility] Hematology/Oncology Clinical Pharmacist [facility] Oral Chemotherapy Navigation Clinics [phone] 01/04/2023 10:37 AM

## 2023-01-20 ENCOUNTER — Ambulatory Visit (HOSPITAL_COMMUNITY)
Admission: RE | Admit: 2023-01-20 | Discharge: 2023-01-20 | Disposition: A | Discharge status: Home / Self Care | Payer: Medicare HMO | Source: Ambulatory Visit | Attending: Hematology | Admitting: Hematology

## 2023-01-20 DIAGNOSIS — C220 Liver cell carcinoma: Secondary | ICD-10-CM | POA: Insufficient documentation

## 2023-01-20 DIAGNOSIS — R918 Other nonspecific abnormal finding of lung field: Secondary | ICD-10-CM | POA: Diagnosis not present

## 2023-01-20 DIAGNOSIS — K7689 Other specified diseases of liver: Secondary | ICD-10-CM | POA: Diagnosis not present

## 2023-01-20 DIAGNOSIS — J432 Centrilobular emphysema: Secondary | ICD-10-CM | POA: Diagnosis not present

## 2023-01-22 ENCOUNTER — Telehealth: Payer: Self-pay | Admitting: Hematology

## 2023-01-25 DIAGNOSIS — N1832 Chronic kidney disease, stage 3b: Secondary | ICD-10-CM | POA: Diagnosis not present

## 2023-01-26 ENCOUNTER — Other Ambulatory Visit: Payer: Self-pay

## 2023-01-26 ENCOUNTER — Other Ambulatory Visit (HOSPITAL_COMMUNITY): Payer: Self-pay

## 2023-01-29 ENCOUNTER — Other Ambulatory Visit: Payer: Self-pay

## 2023-01-29 DIAGNOSIS — C220 Liver cell carcinoma: Secondary | ICD-10-CM

## 2023-02-01 ENCOUNTER — Other Ambulatory Visit: Payer: Self-pay

## 2023-02-01 ENCOUNTER — Inpatient Hospital Stay: Payer: Medicare HMO | Attending: Hematology

## 2023-02-01 ENCOUNTER — Ambulatory Visit (HOSPITAL_COMMUNITY)
Admission: RE | Admit: 2023-02-01 | Discharge: 2023-02-01 | Disposition: A | Discharge status: Home / Self Care | Payer: Medicare HMO | Source: Ambulatory Visit | Attending: Hematology | Admitting: Hematology

## 2023-02-01 ENCOUNTER — Inpatient Hospital Stay: Payer: Medicare HMO | Admitting: Hematology

## 2023-02-01 VITALS — BP 149/88 | HR 78 | Temp 97.9°F | Resp 18 | Wt 193.5 lb

## 2023-02-01 DIAGNOSIS — Z08 Encounter for follow-up examination after completed treatment for malignant neoplasm: Secondary | ICD-10-CM | POA: Insufficient documentation

## 2023-02-01 DIAGNOSIS — C220 Liver cell carcinoma: Secondary | ICD-10-CM

## 2023-02-01 DIAGNOSIS — M25552 Pain in left hip: Secondary | ICD-10-CM | POA: Insufficient documentation

## 2023-02-01 DIAGNOSIS — D472 Monoclonal gammopathy: Secondary | ICD-10-CM | POA: Diagnosis not present

## 2023-02-01 DIAGNOSIS — D649 Anemia, unspecified: Secondary | ICD-10-CM | POA: Diagnosis not present

## 2023-02-01 DIAGNOSIS — M79652 Pain in left thigh: Secondary | ICD-10-CM | POA: Diagnosis not present

## 2023-02-01 DIAGNOSIS — Z8505 Personal history of malignant neoplasm of liver: Secondary | ICD-10-CM | POA: Diagnosis not present

## 2023-02-01 LAB — CBC WITH DIFFERENTIAL (CANCER CENTER ONLY)
Abs Immature Granulocytes: 0.01 K/uL (ref 0.00–0.07)
Basophils Absolute: 0 K/uL (ref 0.0–0.1)
Basophils Relative: 1 %
Eosinophils Absolute: 0.1 K/uL (ref 0.0–0.5)
Eosinophils Relative: 2 %
HCT: 48.9 % (ref 39.0–52.0)
Hemoglobin: 15.1 g/dL (ref 13.0–17.0)
Immature Granulocytes: 0 %
Lymphocytes Relative: 32 %
Lymphs Abs: 1.3 K/uL (ref 0.7–4.0)
MCH: 23.5 pg — ABNORMAL LOW (ref 26.0–34.0)
MCHC: 30.9 g/dL (ref 30.0–36.0)
MCV: 76.2 fL — ABNORMAL LOW (ref 80.0–100.0)
Monocytes Absolute: 0.3 K/uL (ref 0.1–1.0)
Monocytes Relative: 8 %
Neutro Abs: 2.3 K/uL (ref 1.7–7.7)
Neutrophils Relative %: 57 %
Platelet Count: 150 K/uL (ref 150–400)
RBC: 6.42 MIL/uL — ABNORMAL HIGH (ref 4.22–5.81)
RDW: 17.7 % — ABNORMAL HIGH (ref 11.5–15.5)
WBC Count: 4 K/uL (ref 4.0–10.5)
nRBC: 0 % (ref 0.0–0.2)

## 2023-02-01 LAB — CMP (CANCER CENTER ONLY)
ALT: 28 U/L (ref 0–44)
AST: 85 U/L — ABNORMAL HIGH (ref 15–41)
Albumin: 4.1 g/dL (ref 3.5–5.0)
Alkaline Phosphatase: 150 U/L — ABNORMAL HIGH (ref 38–126)
Anion gap: 8 (ref 5–15)
BUN: 28 mg/dL — ABNORMAL HIGH (ref 8–23)
CO2: 25 mmol/L (ref 22–32)
Calcium: 9.5 mg/dL (ref 8.9–10.3)
Chloride: 105 mmol/L (ref 98–111)
Creatinine: 2.11 mg/dL — ABNORMAL HIGH (ref 0.61–1.24)
GFR, Estimated: 32 mL/min — ABNORMAL LOW
Glucose, Bld: 121 mg/dL — ABNORMAL HIGH (ref 70–99)
Potassium: 4 mmol/L (ref 3.5–5.1)
Sodium: 138 mmol/L (ref 135–145)
Total Bilirubin: 0.7 mg/dL (ref 0.3–1.2)
Total Protein: 8.1 g/dL (ref 6.5–8.1)

## 2023-02-01 LAB — MAGNESIUM: Magnesium: 1.9 mg/dL (ref 1.7–2.4)

## 2023-02-01 MED ORDER — HYDROCORTISONE 1 % EX OINT: 1.0000 | TOPICAL_OINTMENT | Freq: Two times a day (BID) | CUTANEOUS | 0 refills | Status: DC

## 2023-02-01 MED ORDER — CLOTRIMAZOLE 1 % EX CREA: 1.0000 | TOPICAL_CREAM | Freq: Two times a day (BID) | CUTANEOUS | 0 refills | Status: DC

## 2023-02-01 NOTE — Progress Notes (Signed)
HEMATOLOGY/ONCOLOGY CLINIC NOTE  Date of Service: 02/01/23  Patient Care Team: Irena Reichmann, DO as PCP - General (Family Medicine) Johney Maine, MD as Consulting Physician (Hematology)  CHIEF COMPLAINTS/PURPOSE OF CONSULTATION:  Follow-up for continued evaluation and management of hepatocellular carcinoma.  HISTORY OF PRESENTING ILLNESS:  Please see previous note for details on initial presentation  INTERVAL HISTORY:  Aaron [name] is a 77 y.o. male who is connected via phone for continued evaluation and management of HCC. Patient was last seen by me on 12/28/2022 and was doing well overall.   Patient is accompanied by his wife during this visit. He has been doing well without any severe medical concerns since our last visit. He has started taking Cabozantinib 40 mg since our last visit. Patient reports he is tolerating new treatment well with mild constipation and occasional abdominal fullness. He has been taking Gas-X, which has been helping his constipation.   He also complains of discoloration around left side of his face which he first noticed around 2 weeks ago with intermittent itchiness.   Patient notes SOB when exercising, but otherwise has no breathing issues.   He complains of left calf pain that sometimes radiates to the whole leg, but denies pain upon palpation.  Patient reports of intermittent left hip pain that occurs daily, and has worsened over the past few days. He currently takes Tylenol to relieve his pain. He denies any recent falls or injuries to is left hip.   He notes that he has lost 3 pound, but is slowly gaining appetite.   Patient reports that his blood sugar is in the normal range at home, between 95-100 195.  He denies abdominal pain, black stools, blood in the stools, nausea, vomiting, mouth sores, leg swelling, new/worsening fatigue, headaches, allergies, new infection issues, chest pain, fever, chills, night sweats.  MEDICAL HISTORY:   Past Medical History:  Diagnosis Date   Arthritis    Diabetes mellitus without complication (HCC)    type II    Dyspnea    Elevated liver enzymes    Fatty liver    Hepatitis C    genotype 1A status post treatment with Heloise Purpura and Ribavarin for 24 weeks   Hepatocellular carcinoma (HCC) 01/30/2014   Path   History of colon polyps    Hyperlipidemia    Hypertension    Iron deficiency anemia    hx of    MGUS (monoclonal gammopathy of unknown significance)    Obesity Hepatocellular carcinoma treated with TACE and percutaneous thermal ablation by interventional radiology. Last MRI on 08/06/2015 showed slight decrease in the size of the ablation defect involving the right lobe of the liver area and no findings to suggest residual or recurrent hepatocellular carcinoma. Mild changes of liver cirrhosis. MRI Abd 06/24/2016- no evidence of recurrent HCC   Hepatitis C genotype 1A status post treatment with Viekira and Ribavarin for 24 weeks.  Monoclonal gammopathy of undetermined significance.  SURGICAL HISTORY:  Status post microwave ablation[October 2015] of liver lesion and TACE [July 2015] for HCC. EGD and colonoscopy in 2016   SOCIAL HISTORY: Social History   Socioeconomic History   Marital status: Married    Spouse name: Not on file   Number of children: Not on file   Years of education: Not on file   Highest education level: Not on file  Occupational History   Not on file  Tobacco Use   Smoking status: Former    Types: Cigarettes  Quit date: 01/30/1994    Years since quitting: 29.0   Smokeless tobacco: Never   Tobacco comments:    stopped 25 yrs ago  Vaping Use   Vaping Use: Never used  Substance and Sexual Activity   Alcohol use: No    Comment: stopped 30 years ago    Drug use: No   Sexual activity: Not Currently  Other Topics Concern   Not on file  Social History Narrative   Not on file   Social Determinants of Health   Financial Resource Strain: Not on  file  Food Insecurity: Not on file  Transportation Needs: Not on file  Physical Activity: Not on file  Stress: Not on file  Social Connections: Not on file  Intimate Partner Violence: Not on file  Former smoker and smoked 1 pack per day for about 20 years starting at age 41 quit 30 years ago.  FAMILY HISTORY: No family history on file.  ALLERGIES:  has No Known Allergies.  MEDICATIONS:  Current Outpatient Medications  Medication Sig Dispense Refill   ACCU-CHEK AVIVA PLUS test strip      Accu-Chek Softclix Lancets lancets      amLODipine (NORVASC) 10 MG tablet Take 1 tablet (10 mg total) by mouth daily. 90 tablet 1   aspirin EC 81 MG tablet Take 81 mg by mouth every morning.      cabozantinib (CABOMETYX) 40 MG tablet Take 1 tablet (40 mg total) by mouth daily. Take on an empty stomach, 1 hour before or 2 hours after meals. 30 tablet 1   cholecalciferol (VITAMIN D) 1000 UNITS tablet Take 1,000 Units by mouth every morning.      glipiZIDE (GLUCOTROL XL) 2.5 MG 24 hr tablet Take 2.5 mg by mouth daily with breakfast.     Insulin Glargine (LANTUS SOLOSTAR) 100 UNIT/ML Solostar Pen Inject 32 Units into the skin daily.     Metoprolol Succinate 50 MG CS24 1 capsule     Multiple Vitamin (MULTIVITAMIN WITH MINERALS) TABS tablet Take 1 tablet daily by mouth.     oxyCODONE (OXY IR/ROXICODONE) 5 MG immediate release tablet Take 1 tablet (5 mg total) by mouth every 6 (six) hours as needed for severe pain. 30 tablet 0   PAZEO 0.7 % SOLN Place 1 drop into both eyes every morning.  3   senna-docusate (SENNA S) 8.6-50 MG tablet Take 2 tablets by mouth at bedtime as needed for mild constipation or moderate constipation. 60 tablet 1   simvastatin (ZOCOR) 20 MG tablet Take 20 mg by mouth daily.     No current facility-administered medications for this visit.   Facility-Administered Medications Ordered in Other Visits  Medication Dose Route Frequency Provider Last Rate Last Admin   dexamethasone  (DECADRON) injection 8 mg  8 mg Intravenous Once Alex Gardener T, NP       ondansetron [facility]) 8 mg in sodium chloride 0.9 % 50 mL IVPB  8 mg Intravenous Once Alex Gardener T, NP       pantoprazole (PROTONIX) injection 40 mg  40 mg Intravenous Once Shon Hough, NP        REVIEW OF SYSTEMS:    10 Point review of Systems was done is negative except as noted above.   PHYSICAL EXAMINATION: Telemedicine visit  LABORATORY DATA:  I have reviewed the data as listed     Latest Ref Rng & Units 02/01/2023   12:27 PM 12/18/2022    2:21 PM 10/20/2022   12:29 PM  CBC  WBC 4.0 - 10.5 K/uL 4.0  4.2  4.8   Hemoglobin 13.0 - 17.0 g/dL 16.1  09.6  04.5   Hematocrit 39.0 - 52.0 % 48.9  47.1  42.2   Platelets 150 - 400 K/uL 150  140  165     CBC    Component Value Date/Time   WBC 4.0 02/01/2023 1227   WBC 5.3 07/22/2022 0812   RBC 6.42 (H) 02/01/2023 1227   HGB 15.1 02/01/2023 1227   HGB 11.5 (L) 03/22/2017 1402   HCT 48.9 02/01/2023 1227   HCT 37.0 (L) 03/22/2017 1402   PLT 150 02/01/2023 1227   PLT 262 03/22/2017 1402   MCV 76.2 (L) 02/01/2023 1227   MCV 72.3 (L) 03/22/2017 1402   MCH 23.5 (L) 02/01/2023 1227   MCHC 30.9 02/01/2023 1227   RDW 17.7 (H) 02/01/2023 1227   RDW 15.6 (H) 03/22/2017 1402   LYMPHSABS 1.3 02/01/2023 1227   LYMPHSABS 1.8 03/22/2017 1402   MONOABS 0.3 02/01/2023 1227   MONOABS 0.4 03/22/2017 1402   EOSABS 0.1 02/01/2023 1227   EOSABS 0.1 03/22/2017 1402   BASOSABS 0.0 02/01/2023 1227   BASOSABS 0.0 03/22/2017 1402    .    Latest Ref Rng & Units 02/01/2023   12:27 PM 12/18/2022    2:21 PM 10/20/2022   12:29 PM  CMP  Glucose 70 - 99 mg/dL 409  811  96   BUN 8 - 23 mg/dL 28  24  27    Creatinine 0.61 - 1.24 mg/dL 9.14  7.82  9.56   Sodium 135 - 145 mmol/L 138  139  140   Potassium 3.5 - 5.1 mmol/L 4.0  4.4  4.4   Chloride 98 - 111 mmol/L 105  106  105   CO2 22 - 32 mmol/L 25  26  28    Calcium 8.9 - 10.3 mg/dL 9.5  9.4  8.7   Total Protein 6.5 - 8.1  g/dL 8.1  7.5  7.6   Total Bilirubin 0.3 - 1.2 mg/dL 0.7  0.5  0.6   Alkaline Phos 38 - 126 U/L 150  109  81   AST 15 - 41 U/L 85  51  41   ALT 0 - 44 U/L 28  23  19        06/21/2019 MR ABDOMEN WWO CONTRAST (Accession [id number])     IFE 1  Comment    Comments: Immunofixation shows IgG monoclonal protein with kappa light chain  specificity.           RADIOGRAPHIC STUDIES:  MRI abd w and wo contrast: 12/17/2016: IMPRESSION: 1. Postprocedural changes of thermal ablation again noted in the right lobe of the liver, without definitive evidence to suggest local recurrence of disease. No new hepatic lesions are noted. 2. Additional incidental findings, as above.     Electronically Signed   By: Trudie Reed M.D.   On: 12/17/2016 09:42  MRI abd w and wo contrast 01/26/2020 IMPRESSION: 1. Interval enlargement of multiple hepatic masses as detailed above, consistent with progression of multifocal hepatocellular carcinoma. There is evidence of prior ablation in hepatic segments VII and VIII. 2. No evidence of metastatic disease in the abdomen.   Electronically Signed   By: Lauralyn Primes M.D.   On: 01/27/2020 15:51  MRI abd and wo contrast 10/04/2021 FINDINGS: Lower chest: Unremarkable   Hepatobiliary: Multiple liver lesions are present, some of these represent previously treated tumors and some represent active enhancing malignancy.  An index solid enhancing lesion in the lateral segment left hepatic lobe measures 3.4 by 2.8 cm on image 15 series 2, formerly 3.5 by 2.7 cm, essentially stable. Just below this a T2 hyperintense lesion measuring 1.5 by 1.1 cm on image 18 of series 2 previously measured 1.4 by 1.2 cm, likewise stable.   A currently enhancing lesion in the right hepatic lobe adjacent to the IVC measures 1.8 by 1.6 cm on image 13 series 2, stable.   A mostly centrally necrotic a 3.6 by 3.1 cm mostly centrally necrotic lesion in segment 4a of the liver  on image 29 of series 21 previously measured 4.3 by 3.0 cm. This lesion is mostly centrally necrotic today although with some nodular enhancement along its margin as on image 29 series 21.   Previously treated right hepatic lobe lesions are observed, and mostly nonenhancing or poorly enhancing.   A somewhat indistinctly marginated lesion posteriorly in segment 4a of the liver is diffusely enhancing and measures about 2.9 by 2.8 cm on image 28 of series 21. A previous faintly T2 hyperintense lesion in this vicinity on 02/20/2021 measured 2.0 by 1.6 cm, and accordingly this represents enlargement of this enhancing mass.   An enhancing mass laterally in segment 3 of the liver measures 3.0 by 2.6 cm on image 31 series 8, formerly 2.9 by 2.6 cm, essentially stable.   No biliary dilatation is observed.   Pancreas:  Unremarkable   Spleen:  Unremarkable   Adrenals/Urinary Tract:  Unremarkable   Stomach/Bowel: Unremarkable   Vascular/Lymphatic: Atherosclerosis is present, including aortoiliac atherosclerotic disease.   Other:  No supplemental non-categorized findings.   Musculoskeletal: Unchanged mild lower lumbar spondylosis and degenerative disc disease.   IMPRESSION: 1. Mixed appearance, with some of the enhancing liver lesions stable; some improved such as the centrally necrotic anterior segment 4A lesion which demonstrates substantial central necrosis compared to previous; but with at least 1 worsened lesion which is the posterior segment 4a lesion (image 29, series 20), moderately increased in size. 2. Aortic Atherosclerosis (ICD10-I70.0). 3. Mild lower lumbar spondylosis and degenerative disc disease.  Electronically Signed   By: Gaylyn Rong M.D.   On: 10/06/2021 11:08  ASSESSMENT & PLAN:   77 y.o. male with  #1 Monoclonal gammopathy of undetermined significance.  M protein 0.2 mg/dL immunofixation showing IgG monoclonal protein with kappa light chain  specificity. SPEP 02/2016 - 0.3g/dl  No overt anemia.  No overt hypercalcemia. Stable CKD creatinine 1.6  #2 bone lucencies in the right radial mid midshaft and left humeral head. PET/CT did not show any hypermetabolic bone lesions. MRI left shoulder showed no evidence of metastatic disease or multiple myeloma. The x-ray findings appear to be areas of mild demineralization. Patient was seen by orthopedics and no additional recommendations were given.  Plan -Patient's anemia is stable. No significant change in renal function. No new bone pains. No hypercalcemia . No overall no overt evidence of progression of multiple myeloma. -Myeloma labs from today show stable M protein @ 0.2g/dl with no evidence of progression- consistent with MGUS.  #3 Hepatocellular carcinoma treated with TACE and percutaneous thermal ablation , Y 90 and recent bland arterial embolization on 06/06/2021 by interventional radiology.   Patient had bland embolization on 02/05/2022 for his hepatocellular carcinoma.   Last MRI on 08/06/2015 showed slight decrease in the size of the ablation defect involving the right lobe of the liver area and no findings to suggest residual or recurrent hepatocellular carcinoma. Mild changes of  liver cirrhosis.  01/16/2016 MRI Abdomen showed resolution of previously ablated lesion but a new 1.3 cm lesion was noted which was subsequently ablated under CT guidance by interventional radiology on 01/31/2016. Elevated transaminases on 02/01/2016 were likely related to his ablation. These have since resolved.   06/24/2016 MRI Abdomen showed no evidence of recurrent hepatocellular carcinoma . 12/17/2016 MRI Abdomen shows no evidence of recurrent hepatocellular carcinoma .  10/26/17 MRI Abdomen revealed Motion degraded images. Status post thermal ablation of the segment 8 lesion, without enhancement to suggest residual viable tumor. Prior ablation of a segment 7 lesion, grossly unchanged. No convincing  central enhancement  03/14/2019  MRI abdomen w and wo contrast revealed "1. New areas of restricted diffusion, surrounding nodular arterial phase enhancement and delayed washout surrounding the ablation zone defects anteriorly in segments 8 and 7 consistent with local recurrence of hepatocellular carcinoma. 2. The peripheral ablation zone defect laterally in segment 8 is unchanged. 3. No extrahepatic tumor identified."  10/02/2019 MRI Abdomen ([id number]) revealed "1. Progressive multifocal hepatocellular carcinoma, especially anteriorly around the ablated lesion in segments 4B and 8. There are additional new lesions in segments 2 and 3. No evidence of extrahepatic metastatic disease."  01/26/2020 MRI Abdomen ([id number]) revealed "1. Interval enlargement of multiple hepatic masses as detailed above, consistent with progression of multifocal hepatocellular carcinoma. There is evidence of prior ablation in hepatic segments VII and VIII. 2. No evidence of metastatic disease in the abdomen."  10/04/2021 MRI Abdomen ([id number]) revealed "1. Mixed appearance, with some of the enhancing liver lesions stable; some improved such as the centrally necrotic anterior segment 4A lesion which demonstrates substantial central necrosis compared to previous; but with at least 1 worsened lesion which is the posterior segment 4a lesion (image 29, series 20), moderately increased in size."  04/09/2022 MRI Abdomen ([id number]) revealed "1. Again noted are multiple lesions involving both lobes of liver compatible with multifocal hepatocellular carcinoma. When compared with the previous exam there no significant change in the overall tumor volume from 10/04/2021. 2. Interval treatment response with hypoenhancement of lesion within the segment 3 and the small lesion within dome of segment 2. 3. The remaining lesions continue to exhibit variable degrees of internal enhancement. 4. There is a new enhancing lesion within the dome  of liver (segment 8/4a). 5. Aortic Atherosclerosis"   #4 Hepatitis C genotype 1A status post treatment with Viekira and Ribavarin for 24 weeks.   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    PLAN:  -Discussed lab results today, 02/01/2023, with patient. CBC were within normal ranges. CMP showed increased glucose at 121, increased BUN at 28, increased Creatinine at 2.11, increased Alkaline Phos at 150 U/L, and increased AST at 85 U/L. -Informed patient of side effects of Cabozantinib, including constipation and skin rashes. -Patient has been tolerating his current dose of Cabozantinib well without any severe toxicities. Does complain of mild skin rash and constipation.  -Continue Cabozantinib 40 mg. -Discussed with the patient that we will wait for 2-3 months to see if the treatment is working. We will get lab results with tumor marker.  -Will prescribe anti-fungal with steroid for his discoloration near his left side of the face.  -Discussed the option of X-ray of the left hip due to consistent left hip pain. Patient agrees.  -Patient will have an X-ray today after this visit.    FOLLOW-UP: X ray left hip and femur today RTC with Dr Candise Che with labs  in 4 weeks  The total time spent in the appointment was 30 minutes* .  All of the patient's questions were answered with apparent satisfaction. The patient knows to call the clinic with any problems, questions or concerns.   Wyvonnia Lora MD MS AAHIVMS [facility] Hematology/Oncology Physician [facility]  .*Total Encounter Time as defined by the Centers for Medicare and Medicaid Services includes, in addition to the face-to-face time of a patient visit (documented in the note above) non-face-to-face time: obtaining and reviewing outside history, ordering and reviewing medications, tests or procedures, care coordination (communications with other health care  professionals or caregivers) and documentation in the medical record.   I,Param Shah,acting as a Neurosurgeon for Wyvonnia Lora, MD.,have documented all relevant documentation on the behalf of Wyvonnia Lora, MD,as directed by  Wyvonnia Lora, MD while in the presence of Wyvonnia Lora, MD.  .I have reviewed the above documentation for accuracy and completeness, and I agree with the above. Johney Maine MD

## 2023-02-03 ENCOUNTER — Telehealth: Payer: Self-pay | Admitting: Hematology

## 2023-02-03 LAB — AFP TUMOR MARKER: AFP, Serum, Tumor Marker: 24444 ng/mL — ABNORMAL HIGH (ref 0.0–8.4)

## 2023-02-05 DIAGNOSIS — D472 Monoclonal gammopathy: Secondary | ICD-10-CM | POA: Diagnosis not present

## 2023-02-05 DIAGNOSIS — E1122 Type 2 diabetes mellitus with diabetic chronic kidney disease: Secondary | ICD-10-CM | POA: Diagnosis not present

## 2023-02-05 DIAGNOSIS — E1129 Type 2 diabetes mellitus with other diabetic kidney complication: Secondary | ICD-10-CM | POA: Diagnosis not present

## 2023-02-05 DIAGNOSIS — N1832 Chronic kidney disease, stage 3b: Secondary | ICD-10-CM | POA: Diagnosis not present

## 2023-02-05 DIAGNOSIS — R809 Proteinuria, unspecified: Secondary | ICD-10-CM | POA: Diagnosis not present

## 2023-02-05 DIAGNOSIS — C220 Liver cell carcinoma: Secondary | ICD-10-CM | POA: Diagnosis not present

## 2023-02-05 DIAGNOSIS — I129 Hypertensive chronic kidney disease with stage 1 through stage 4 chronic kidney disease, or unspecified chronic kidney disease: Secondary | ICD-10-CM | POA: Diagnosis not present

## 2023-02-12 DIAGNOSIS — E7800 Pure hypercholesterolemia, unspecified: Secondary | ICD-10-CM | POA: Diagnosis not present

## 2023-02-12 DIAGNOSIS — C220 Liver cell carcinoma: Secondary | ICD-10-CM | POA: Diagnosis not present

## 2023-02-12 DIAGNOSIS — E1149 Type 2 diabetes mellitus with other diabetic neurological complication: Secondary | ICD-10-CM | POA: Diagnosis not present

## 2023-02-12 DIAGNOSIS — I10 Essential (primary) hypertension: Secondary | ICD-10-CM | POA: Diagnosis not present

## 2023-02-22 ENCOUNTER — Other Ambulatory Visit (HOSPITAL_COMMUNITY): Payer: Self-pay

## 2023-02-23 ENCOUNTER — Other Ambulatory Visit: Payer: Self-pay | Admitting: Hematology

## 2023-02-23 ENCOUNTER — Other Ambulatory Visit (HOSPITAL_COMMUNITY): Payer: Self-pay

## 2023-02-23 DIAGNOSIS — C220 Liver cell carcinoma: Secondary | ICD-10-CM

## 2023-02-23 MED ORDER — CABOMETYX 40 MG PO TABS
40.0000 mg | ORAL_TABLET | Freq: Every day | ORAL | 1 refills | Status: DC
Start: 2023-02-23 — End: 2023-04-20
  Filled 2023-02-23: qty 30, 30d supply, fill #0
  Filled 2023-03-17: qty 30, 30d supply, fill #1

## 2023-02-26 ENCOUNTER — Other Ambulatory Visit: Payer: Self-pay

## 2023-02-26 DIAGNOSIS — C220 Liver cell carcinoma: Secondary | ICD-10-CM

## 2023-03-01 ENCOUNTER — Other Ambulatory Visit: Payer: Self-pay

## 2023-03-01 ENCOUNTER — Inpatient Hospital Stay: Payer: Medicare HMO | Attending: Hematology

## 2023-03-01 ENCOUNTER — Inpatient Hospital Stay: Payer: Medicare HMO | Admitting: Hematology

## 2023-03-01 ENCOUNTER — Other Ambulatory Visit (HOSPITAL_COMMUNITY): Payer: Self-pay

## 2023-03-01 VITALS — BP 150/88 | HR 89 | Temp 97.3°F | Resp 18 | Wt 190.0 lb

## 2023-03-01 DIAGNOSIS — C220 Liver cell carcinoma: Secondary | ICD-10-CM | POA: Diagnosis not present

## 2023-03-01 DIAGNOSIS — M25552 Pain in left hip: Secondary | ICD-10-CM

## 2023-03-01 DIAGNOSIS — Z8505 Personal history of malignant neoplasm of liver: Secondary | ICD-10-CM | POA: Diagnosis not present

## 2023-03-01 DIAGNOSIS — I129 Hypertensive chronic kidney disease with stage 1 through stage 4 chronic kidney disease, or unspecified chronic kidney disease: Secondary | ICD-10-CM | POA: Diagnosis not present

## 2023-03-01 DIAGNOSIS — D509 Iron deficiency anemia, unspecified: Secondary | ICD-10-CM | POA: Diagnosis not present

## 2023-03-01 DIAGNOSIS — D472 Monoclonal gammopathy: Secondary | ICD-10-CM | POA: Insufficient documentation

## 2023-03-01 DIAGNOSIS — Z08 Encounter for follow-up examination after completed treatment for malignant neoplasm: Secondary | ICD-10-CM | POA: Insufficient documentation

## 2023-03-01 DIAGNOSIS — N189 Chronic kidney disease, unspecified: Secondary | ICD-10-CM | POA: Insufficient documentation

## 2023-03-01 LAB — CBC WITH DIFFERENTIAL (CANCER CENTER ONLY)
Abs Immature Granulocytes: 0.01 10*3/uL (ref 0.00–0.07)
Basophils Absolute: 0 10*3/uL (ref 0.0–0.1)
Basophils Relative: 0 %
Eosinophils Absolute: 0.1 10*3/uL (ref 0.0–0.5)
Eosinophils Relative: 2 %
HCT: 44.8 % (ref 39.0–52.0)
Hemoglobin: 14 g/dL (ref 13.0–17.0)
Immature Granulocytes: 0 %
Lymphocytes Relative: 30 %
Lymphs Abs: 1.4 10*3/uL (ref 0.7–4.0)
MCH: 23.9 pg — ABNORMAL LOW (ref 26.0–34.0)
MCHC: 31.3 g/dL (ref 30.0–36.0)
MCV: 76.6 fL — ABNORMAL LOW (ref 80.0–100.0)
Monocytes Absolute: 0.4 10*3/uL (ref 0.1–1.0)
Monocytes Relative: 8 %
Neutro Abs: 2.8 10*3/uL (ref 1.7–7.7)
Neutrophils Relative %: 60 %
Platelet Count: 206 10*3/uL (ref 150–400)
RBC: 5.85 MIL/uL — ABNORMAL HIGH (ref 4.22–5.81)
RDW: 18.2 % — ABNORMAL HIGH (ref 11.5–15.5)
WBC Count: 4.7 10*3/uL (ref 4.0–10.5)
nRBC: 0 % (ref 0.0–0.2)

## 2023-03-01 LAB — CMP (CANCER CENTER ONLY)
ALT: 34 U/L (ref 0–44)
AST: 120 U/L — ABNORMAL HIGH (ref 15–41)
Albumin: 3.3 g/dL — ABNORMAL LOW (ref 3.5–5.0)
Alkaline Phosphatase: 261 U/L — ABNORMAL HIGH (ref 38–126)
Anion gap: 7 (ref 5–15)
BUN: 24 mg/dL — ABNORMAL HIGH (ref 8–23)
CO2: 30 mmol/L (ref 22–32)
Calcium: 8.9 mg/dL (ref 8.9–10.3)
Chloride: 103 mmol/L (ref 98–111)
Creatinine: 1.79 mg/dL — ABNORMAL HIGH (ref 0.61–1.24)
GFR, Estimated: 39 mL/min — ABNORMAL LOW (ref >60)
Glucose, Bld: 142 mg/dL — ABNORMAL HIGH (ref 70–99)
Potassium: 3.6 mmol/L (ref 3.5–5.1)
Sodium: 140 mmol/L (ref 135–145)
Total Bilirubin: 0.8 mg/dL (ref 0.3–1.2)
Total Protein: 7.3 g/dL (ref 6.5–8.1)

## 2023-03-01 LAB — MAGNESIUM: Magnesium: 1.9 mg/dL (ref 1.7–2.4)

## 2023-03-01 MED ORDER — TRAMADOL HCL 50 MG PO TABS: 50.0000 mg | ORAL_TABLET | Freq: Four times a day (QID) | ORAL | 0 refills | Status: DC | PRN

## 2023-03-01 NOTE — Progress Notes (Signed)
HEMATOLOGY/ONCOLOGY CLINIC NOTE  Date of Service: 03/01/23  Patient Care Team: Irena Reichmann, DO as PCP - General (Family Medicine) Johney Maine, MD as Consulting Physician (Hematology)  CHIEF COMPLAINTS/PURPOSE OF CONSULTATION:  Follow-up for continued evaluation and management of hepatocellular carcinoma.  HISTORY OF PRESENTING ILLNESS:  Please see previous note for details on initial presentation  INTERVAL HISTORY:  Aaron [name] is a 77 y.o. male who is connected via phone for continued evaluation and management of HCC.   Patient was last seen by me on 02/01/2023 and he complained of mild constipation, occasional abdominal fullness, and discoloration around left side of his face with intermittent itchiness, SOB when exercising, occasional left calf pain which radiates to the whole leg, intermittent left hip pain.  Patient notes he has been doing well overall without ant new medical concerns since our last visit. He has been tolerating his cabozantinib medication well with mild toxicities, which includes constipation and flatus. He denies nausea and mouth sores.   Patient notes he has been eating well overall, but has lost around 3 lbs since our last visit.   Patient complains of continuous left hip pain and abdominal fullness. He has been taking tylenol, which relieves his symptoms.    He denies any new infection issues, fever, chills, night sweats, abdominal pain, chest pain, back pain, or leg swelling.   Patient notes that the Hydrocortisone ointment has been helping him.   MEDICAL HISTORY:  Past Medical History:  Diagnosis Date   Arthritis    Diabetes mellitus without complication (HCC)    type II    Dyspnea    Elevated liver enzymes    Fatty liver    Hepatitis C    genotype 1A status post treatment with Heloise Purpura and Ribavarin for 24 weeks   Hepatocellular carcinoma (HCC) 01/30/2014   Path   History of colon polyps    Hyperlipidemia    Hypertension     Iron deficiency anemia    hx of    MGUS (monoclonal gammopathy of unknown significance)    Obesity Hepatocellular carcinoma treated with TACE and percutaneous thermal ablation by interventional radiology. Last MRI on 08/06/2015 showed slight decrease in the size of the ablation defect involving the right lobe of the liver area and no findings to suggest residual or recurrent hepatocellular carcinoma. Mild changes of liver cirrhosis. MRI Abd 06/24/2016- no evidence of recurrent HCC   Hepatitis C genotype 1A status post treatment with Viekira and Ribavarin for 24 weeks.  Monoclonal gammopathy of undetermined significance.  SURGICAL HISTORY:  Status post microwave ablation[October 2015] of liver lesion and TACE [July 2015] for HCC. EGD and colonoscopy in 2016   SOCIAL HISTORY: Social History   Socioeconomic History   Marital status: Married    Spouse name: Not on file   Number of children: Not on file   Years of education: Not on file   Highest education level: Not on file  Occupational History   Not on file  Tobacco Use   Smoking status: Former    Types: Cigarettes    Quit date: 01/30/1994    Years since quitting: 29.1   Smokeless tobacco: Never   Tobacco comments:    stopped 25 yrs ago  Vaping Use   Vaping Use: Never used  Substance and Sexual Activity   Alcohol use: No    Comment: stopped 30 years ago    Drug use: No   Sexual activity: Not Currently  Other Topics Concern  Not on file  Social History Narrative   Not on file   Social Determinants of Health   Financial Resource Strain: Not on file  Food Insecurity: Not on file  Transportation Needs: Not on file  Physical Activity: Not on file  Stress: Not on file  Social Connections: Not on file  Intimate Partner Violence: Not on file  Former smoker and smoked 1 pack per day for about 20 years starting at age 18 quit 30 years ago.  FAMILY HISTORY: No family history on file.  ALLERGIES:  has No Known  Allergies.  MEDICATIONS:  Current Outpatient Medications  Medication Sig Dispense Refill   ACCU-CHEK AVIVA PLUS test strip      Accu-Chek Softclix Lancets lancets      amLODipine (NORVASC) 10 MG tablet Take 1 tablet (10 mg total) by mouth daily. 90 tablet 1   aspirin EC 81 MG tablet Take 81 mg by mouth every morning.      cabozantinib (CABOMETYX) 40 MG tablet Take 1 tablet (40 mg total) by mouth daily. Take on an empty stomach, 1 hour before or 2 hours after meals. 30 tablet 1   cholecalciferol (VITAMIN D) 1000 UNITS tablet Take 1,000 Units by mouth every morning.      clotrimazole (TM-CLOTRIMAZOLE) 1 % cream Apply 1 Application topically 2 (two) times daily. 30 g 0   FARXIGA 10 MG TABS tablet Take 10 mg by mouth daily.     glipiZIDE (GLUCOTROL XL) 2.5 MG 24 hr tablet Take 2.5 mg by mouth daily with breakfast. (Patient not taking: Reported on 02/01/2023)     hydrocortisone 1 % ointment Apply 1 Application topically 2 (two) times daily. 30 g 0   Insulin Glargine (LANTUS SOLOSTAR) 100 UNIT/ML Solostar Pen Inject 32 Units into the skin daily.     Metoprolol Succinate 50 MG CS24 1 capsule     Multiple Vitamin (MULTIVITAMIN WITH MINERALS) TABS tablet Take 1 tablet daily by mouth.     oxyCODONE (OXY IR/ROXICODONE) 5 MG immediate release tablet Take 1 tablet (5 mg total) by mouth every 6 (six) hours as needed for severe pain. (Patient not taking: Reported on 02/01/2023) 30 tablet 0   PAZEO 0.7 % SOLN Place 1 drop into both eyes every morning.  3   senna-docusate (SENNA S) 8.6-50 MG tablet Take 2 tablets by mouth at bedtime as needed for mild constipation or moderate constipation. (Patient not taking: Reported on 02/01/2023) 60 tablet 1   simvastatin (ZOCOR) 20 MG tablet Take 20 mg by mouth daily.     No current facility-administered medications for this visit.   Facility-Administered Medications Ordered in Other Visits  Medication Dose Route Frequency Provider Last Rate Last Admin   dexamethasone  (DECADRON) injection 8 mg  8 mg Intravenous Once Alex Gardener T, NP       ondansetron [facility]) 8 mg in sodium chloride 0.9 % 50 mL IVPB  8 mg Intravenous Once Alex Gardener T, NP       pantoprazole (PROTONIX) injection 40 mg  40 mg Intravenous Once Shon Hough, NP        REVIEW OF SYSTEMS:    10 Point review of Systems was done is negative except as noted above.   PHYSICAL EXAMINATION: Telemedicine visit  LABORATORY DATA:  I have reviewed the data as listed     Latest Ref Rng & Units 03/01/2023   11:26 AM 02/01/2023   12:27 PM 12/18/2022    2:21 PM  CBC  WBC  4.0 - 10.5 K/uL 4.7  4.0  4.2   Hemoglobin 13.0 - 17.0 g/dL 11.9  14.7  82.9   Hematocrit 39.0 - 52.0 % 44.8  48.9  47.1   Platelets 150 - 400 K/uL 206  150  140     CBC    Component Value Date/Time   WBC 4.7 03/01/2023 1126   WBC 5.3 07/22/2022 0812   RBC 5.85 (H) 03/01/2023 1126   HGB 14.0 03/01/2023 1126   HGB 11.5 (L) 03/22/2017 1402   HCT 44.8 03/01/2023 1126   HCT 37.0 (L) 03/22/2017 1402   PLT 206 03/01/2023 1126   PLT 262 03/22/2017 1402   MCV 76.6 (L) 03/01/2023 1126   MCV 72.3 (L) 03/22/2017 1402   MCH 23.9 (L) 03/01/2023 1126   MCHC 31.3 03/01/2023 1126   RDW 18.2 (H) 03/01/2023 1126   RDW 15.6 (H) 03/22/2017 1402   LYMPHSABS 1.4 03/01/2023 1126   LYMPHSABS 1.8 03/22/2017 1402   MONOABS 0.4 03/01/2023 1126   MONOABS 0.4 03/22/2017 1402   EOSABS 0.1 03/01/2023 1126   EOSABS 0.1 03/22/2017 1402   BASOSABS 0.0 03/01/2023 1126   BASOSABS 0.0 03/22/2017 1402    .    Latest Ref Rng & Units 03/01/2023   11:26 AM 02/01/2023   12:27 PM 12/18/2022    2:21 PM  CMP  Glucose 70 - 99 mg/dL 562  130  865   BUN 8 - 23 mg/dL 24  28  24    Creatinine 0.61 - 1.24 mg/dL 7.84  6.96  2.95   Sodium 135 - 145 mmol/L 140  138  139   Potassium 3.5 - 5.1 mmol/L 3.6  4.0  4.4   Chloride 98 - 111 mmol/L 103  105  106   CO2 22 - 32 mmol/L 30  25  26    Calcium 8.9 - 10.3 mg/dL 8.9  9.5  9.4   Total Protein 6.5 - 8.1  g/dL 7.3  8.1  7.5   Total Bilirubin 0.3 - 1.2 mg/dL 0.8  0.7  0.5   Alkaline Phos 38 - 126 U/L 261  150  109   AST 15 - 41 U/L 120  85  51   ALT 0 - 44 U/L 34  28  23       06/21/2019 MR ABDOMEN WWO CONTRAST (Accession [id number])     IFE 1  Comment    Comments: Immunofixation shows IgG monoclonal protein with kappa light chain  specificity.           RADIOGRAPHIC STUDIES:  MRI abd w and wo contrast: 12/17/2016: IMPRESSION: 1. Postprocedural changes of thermal ablation again noted in the right lobe of the liver, without definitive evidence to suggest local recurrence of disease. No new hepatic lesions are noted. 2. Additional incidental findings, as above.     Electronically Signed   By: Trudie Reed M.D.   On: 12/17/2016 09:42  MRI abd w and wo contrast 01/26/2020 IMPRESSION: 1. Interval enlargement of multiple hepatic masses as detailed above, consistent with progression of multifocal hepatocellular carcinoma. There is evidence of prior ablation in hepatic segments VII and VIII. 2. No evidence of metastatic disease in the abdomen.   Electronically Signed   By: Lauralyn Primes M.D.   On: 01/27/2020 15:51  MRI abd and wo contrast 10/04/2021 FINDINGS: Lower chest: Unremarkable   Hepatobiliary: Multiple liver lesions are present, some of these represent previously treated tumors and some represent active enhancing malignancy.   An  index solid enhancing lesion in the lateral segment left hepatic lobe measures 3.4 by 2.8 cm on image 15 series 2, formerly 3.5 by 2.7 cm, essentially stable. Just below this a T2 hyperintense lesion measuring 1.5 by 1.1 cm on image 18 of series 2 previously measured 1.4 by 1.2 cm, likewise stable.   A currently enhancing lesion in the right hepatic lobe adjacent to the IVC measures 1.8 by 1.6 cm on image 13 series 2, stable.   A mostly centrally necrotic a 3.6 by 3.1 cm mostly centrally necrotic lesion in segment 4a of the  liver on image 29 of series 21 previously measured 4.3 by 3.0 cm. This lesion is mostly centrally necrotic today although with some nodular enhancement along its margin as on image 29 series 21.   Previously treated right hepatic lobe lesions are observed, and mostly nonenhancing or poorly enhancing.   A somewhat indistinctly marginated lesion posteriorly in segment 4a of the liver is diffusely enhancing and measures about 2.9 by 2.8 cm on image 28 of series 21. A previous faintly T2 hyperintense lesion in this vicinity on 02/20/2021 measured 2.0 by 1.6 cm, and accordingly this represents enlargement of this enhancing mass.   An enhancing mass laterally in segment 3 of the liver measures 3.0 by 2.6 cm on image 31 series 8, formerly 2.9 by 2.6 cm, essentially stable.   No biliary dilatation is observed.   Pancreas:  Unremarkable   Spleen:  Unremarkable   Adrenals/Urinary Tract:  Unremarkable   Stomach/Bowel: Unremarkable   Vascular/Lymphatic: Atherosclerosis is present, including aortoiliac atherosclerotic disease.   Other:  No supplemental non-categorized findings.   Musculoskeletal: Unchanged mild lower lumbar spondylosis and degenerative disc disease.   IMPRESSION: 1. Mixed appearance, with some of the enhancing liver lesions stable; some improved such as the centrally necrotic anterior segment 4A lesion which demonstrates substantial central necrosis compared to previous; but with at least 1 worsened lesion which is the posterior segment 4a lesion (image 29, series 20), moderately increased in size. 2. Aortic Atherosclerosis (ICD10-I70.0). 3. Mild lower lumbar spondylosis and degenerative disc disease.  Electronically Signed   By: Gaylyn Rong M.D.   On: 10/06/2021 11:08  ASSESSMENT & PLAN:   77 y.o. male with  #1 Monoclonal gammopathy of undetermined significance.  M protein 0.2 mg/dL immunofixation showing IgG monoclonal protein with kappa light chain  specificity. SPEP 02/2016 - 0.3g/dl  No overt anemia.  No overt hypercalcemia. Stable CKD creatinine 1.6  #2 bone lucencies in the right radial mid midshaft and left humeral head. PET/CT did not show any hypermetabolic bone lesions. MRI left shoulder showed no evidence of metastatic disease or multiple myeloma. The x-ray findings appear to be areas of mild demineralization. Patient was seen by orthopedics and no additional recommendations were given.  Plan -Patient's anemia is stable. No significant change in renal function. No new bone pains. No hypercalcemia . No overall no overt evidence of progression of multiple myeloma. -Myeloma labs from today show stable M protein @ 0.2g/dl with no evidence of progression- consistent with MGUS.  #3 Hepatocellular carcinoma treated with TACE and percutaneous thermal ablation , Y 90 and recent bland arterial embolization on 06/06/2021 by interventional radiology.   Patient had bland embolization on 02/05/2022 for his hepatocellular carcinoma.   Last MRI on 08/06/2015 showed slight decrease in the size of the ablation defect involving the right lobe of the liver area and no findings to suggest residual or recurrent hepatocellular carcinoma. Mild changes of liver  cirrhosis.  01/16/2016 MRI Abdomen showed resolution of previously ablated lesion but a new 1.3 cm lesion was noted which was subsequently ablated under CT guidance by interventional radiology on 01/31/2016. Elevated transaminases on 02/01/2016 were likely related to his ablation. These have since resolved.   06/24/2016 MRI Abdomen showed no evidence of recurrent hepatocellular carcinoma . 12/17/2016 MRI Abdomen shows no evidence of recurrent hepatocellular carcinoma .  10/26/17 MRI Abdomen revealed Motion degraded images. Status post thermal ablation of the segment 8 lesion, without enhancement to suggest residual viable tumor. Prior ablation of a segment 7 lesion, grossly unchanged. No convincing  central enhancement  03/14/2019  MRI abdomen w and wo contrast revealed "1. New areas of restricted diffusion, surrounding nodular arterial phase enhancement and delayed washout surrounding the ablation zone defects anteriorly in segments 8 and 7 consistent with local recurrence of hepatocellular carcinoma. 2. The peripheral ablation zone defect laterally in segment 8 is unchanged. 3. No extrahepatic tumor identified."  10/02/2019 MRI Abdomen ([id number]) revealed "1. Progressive multifocal hepatocellular carcinoma, especially anteriorly around the ablated lesion in segments 4B and 8. There are additional new lesions in segments 2 and 3. No evidence of extrahepatic metastatic disease."  01/26/2020 MRI Abdomen ([id number]) revealed "1. Interval enlargement of multiple hepatic masses as detailed above, consistent with progression of multifocal hepatocellular carcinoma. There is evidence of prior ablation in hepatic segments VII and VIII. 2. No evidence of metastatic disease in the abdomen."  10/04/2021 MRI Abdomen ([id number]) revealed "1. Mixed appearance, with some of the enhancing liver lesions stable; some improved such as the centrally necrotic anterior segment 4A lesion which demonstrates substantial central necrosis compared to previous; but with at least 1 worsened lesion which is the posterior segment 4a lesion (image 29, series 20), moderately increased in size."  04/09/2022 MRI Abdomen ([id number]) revealed "1. Again noted are multiple lesions involving both lobes of liver compatible with multifocal hepatocellular carcinoma. When compared with the previous exam there no significant change in the overall tumor volume from 10/04/2021. 2. Interval treatment response with hypoenhancement of lesion within the segment 3 and the small lesion within dome of segment 2. 3. The remaining lesions continue to exhibit variable degrees of internal enhancement. 4. There is a new enhancing lesion within the dome  of liver (segment 8/4a). 5. Aortic Atherosclerosis"   #4 Hepatitis C genotype 1A status post treatment with Viekira and Ribavarin for 24 weeks.   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    PLAN:  -Patient has been tolerating his current dose of Cabozantinib well without any severe toxicities. Does complain of constipation and flatus. -Continue Cabozantinib 40 mg. -Discussed lab results from today, 03/01/2023, with the patient. CBC is stable. CMP shows elevated glucose level at 142, elevated creatinine at 1.79, elevated AST at 120, and elevated alkaline phosphate at 261.  AFP tumor marker pending -Discussed with the patient that he can take Senna-S twice a day for constipation.    FOLLOW-UP: RTC with Dr Candise Che with labs in about 1 month   The total time spent in the appointment was 20 minutes* .  All of the patient's questions were answered with apparent satisfaction. The patient knows to call the clinic with any problems, questions or concerns.   Wyvonnia Lora MD MS AAHIVMS [facility] Hematology/Oncology Physician [facility]  .*Total Encounter Time as defined by the Centers for Medicare and Medicaid Services includes, in addition to the face-to-face time of a  patient visit (documented in the note above) non-face-to-face time: obtaining and reviewing outside history, ordering and reviewing medications, tests or procedures, care coordination (communications with other health care professionals or caregivers) and documentation in the medical record.   I,Param Shah,acting as a Neurosurgeon for Wyvonnia Lora, MD.,have documented all relevant documentation on the behalf of Wyvonnia Lora, MD,as directed by  Wyvonnia Lora, MD while in the presence of Wyvonnia Lora, MD.  .I have reviewed the above documentation for accuracy and completeness, and I agree with the above. Johney Maine MD

## 2023-03-02 LAB — AFP TUMOR MARKER: AFP, Serum, Tumor Marker: 40239 ng/mL — ABNORMAL HIGH (ref 0.0–8.4)

## 2023-03-03 ENCOUNTER — Telehealth: Payer: Self-pay | Admitting: Hematology

## 2023-03-03 DIAGNOSIS — E1149 Type 2 diabetes mellitus with other diabetic neurological complication: Secondary | ICD-10-CM | POA: Diagnosis not present

## 2023-03-03 DIAGNOSIS — D472 Monoclonal gammopathy: Secondary | ICD-10-CM | POA: Diagnosis not present

## 2023-03-03 DIAGNOSIS — M25552 Pain in left hip: Secondary | ICD-10-CM | POA: Diagnosis not present

## 2023-03-03 DIAGNOSIS — C220 Liver cell carcinoma: Secondary | ICD-10-CM | POA: Diagnosis not present

## 2023-03-03 DIAGNOSIS — N184 Chronic kidney disease, stage 4 (severe): Secondary | ICD-10-CM | POA: Diagnosis not present

## 2023-03-03 DIAGNOSIS — E7800 Pure hypercholesterolemia, unspecified: Secondary | ICD-10-CM | POA: Diagnosis not present

## 2023-03-03 DIAGNOSIS — I10 Essential (primary) hypertension: Secondary | ICD-10-CM | POA: Diagnosis not present

## 2023-03-03 DIAGNOSIS — E1142 Type 2 diabetes mellitus with diabetic polyneuropathy: Secondary | ICD-10-CM | POA: Diagnosis not present

## 2023-03-03 NOTE — Telephone Encounter (Signed)
Left patient a message regarding appointment times/dates for follow up

## 2023-03-17 ENCOUNTER — Other Ambulatory Visit (HOSPITAL_COMMUNITY): Payer: Self-pay

## 2023-03-19 DIAGNOSIS — M1612 Unilateral primary osteoarthritis, left hip: Secondary | ICD-10-CM | POA: Diagnosis not present

## 2023-03-19 DIAGNOSIS — M79605 Pain in left leg: Secondary | ICD-10-CM | POA: Diagnosis not present

## 2023-03-30 ENCOUNTER — Other Ambulatory Visit: Payer: Self-pay

## 2023-04-20 ENCOUNTER — Other Ambulatory Visit: Payer: Self-pay | Admitting: Hematology

## 2023-04-20 ENCOUNTER — Other Ambulatory Visit: Payer: Self-pay

## 2023-04-20 ENCOUNTER — Other Ambulatory Visit (HOSPITAL_COMMUNITY): Payer: Self-pay

## 2023-04-20 DIAGNOSIS — C220 Liver cell carcinoma: Secondary | ICD-10-CM

## 2023-04-20 MED ORDER — CABOMETYX 40 MG PO TABS
40.0000 mg | ORAL_TABLET | Freq: Every day | ORAL | 1 refills | Status: DC
  Filled 2023-04-20: qty 30, 30d supply, fill #0

## 2023-04-23 ENCOUNTER — Other Ambulatory Visit: Payer: Self-pay

## 2023-04-23 DIAGNOSIS — C220 Liver cell carcinoma: Secondary | ICD-10-CM

## 2023-04-27 ENCOUNTER — Telehealth: Payer: Self-pay | Admitting: Hematology

## 2023-04-27 ENCOUNTER — Inpatient Hospital Stay: Payer: Medicare HMO

## 2023-04-27 ENCOUNTER — Inpatient Hospital Stay: Payer: Medicare HMO | Admitting: Hematology

## 2023-04-27 NOTE — Progress Notes (Shared)
HEMATOLOGY/ONCOLOGY CLINIC NOTE  Date of Service: 04/27/23  Patient Care Team: Irena Reichmann, DO as PCP - General (Family Medicine) Johney Maine, MD as Consulting Physician (Hematology)  CHIEF COMPLAINTS/PURPOSE OF CONSULTATION:  Follow-up for continued evaluation and management of hepatocellular carcinoma.  HISTORY OF PRESENTING ILLNESS:  Please see previous note for details on initial presentation  INTERVAL HISTORY:  Aaron [name] is a 77 y.o. male who is connected via phone for continued evaluation and management of HCC.   Patient was last seen by me on 03/01/2023 and he complained of mild constipation and flatus due to cabozantinib medication, complained of continuous left hip pain and abdominal fullness.    -Discussed lab results from today, 04/27/2023, with the patient.   MEDICAL HISTORY:  Past Medical History:  Diagnosis Date   Arthritis    Diabetes mellitus without complication (HCC)    type II    Dyspnea    Elevated liver enzymes    Fatty liver    Hepatitis C    genotype 1A status post treatment with Heloise Purpura and Ribavarin for 24 weeks   Hepatocellular carcinoma (HCC) 01/30/2014   Path   History of colon polyps    Hyperlipidemia    Hypertension    Iron deficiency anemia    hx of    MGUS (monoclonal gammopathy of unknown significance)    Obesity Hepatocellular carcinoma treated with TACE and percutaneous thermal ablation by interventional radiology. Last MRI on 08/06/2015 showed slight decrease in the size of the ablation defect involving the right lobe of the liver area and no findings to suggest residual or recurrent hepatocellular carcinoma. Mild changes of liver cirrhosis. MRI Abd 06/24/2016- no evidence of recurrent HCC   Hepatitis C genotype 1A status post treatment with Viekira and Ribavarin for 24 weeks.  Monoclonal gammopathy of undetermined significance.  SURGICAL HISTORY:  Status post microwave ablation[October 2015] of liver lesion  and TACE [July 2015] for HCC. EGD and colonoscopy in 2016   SOCIAL HISTORY: Social History   Socioeconomic History   Marital status: Married    Spouse name: Not on file   Number of children: Not on file   Years of education: Not on file   Highest education level: Not on file  Occupational History   Not on file  Tobacco Use   Smoking status: Former    Current packs/day: 0.00    Types: Cigarettes    Quit date: 01/30/1994    Years since quitting: 29.2   Smokeless tobacco: Never   Tobacco comments:    stopped 25 yrs ago  Vaping Use   Vaping status: Never Used  Substance and Sexual Activity   Alcohol use: No    Comment: stopped 30 years ago    Drug use: No   Sexual activity: Not Currently  Other Topics Concern   Not on file  Social History Narrative   Not on file   Social Determinants of Health   Financial Resource Strain: Not on file  Food Insecurity: Not on file  Transportation Needs: Not on file  Physical Activity: Not on file  Stress: Not on file  Social Connections: Not on file  Intimate Partner Violence: Not on file  Former smoker and smoked 1 pack per day for about 20 years starting at age 37 quit 30 years ago.  FAMILY HISTORY: No family history on file.  ALLERGIES:  has No Known Allergies.  MEDICATIONS:  Current Outpatient Medications  Medication Sig Dispense Refill   ACCU-CHEK  AVIVA PLUS test strip      Accu-Chek Softclix Lancets lancets      amLODipine (NORVASC) 10 MG tablet Take 1 tablet (10 mg total) by mouth daily. 90 tablet 1   aspirin EC 81 MG tablet Take 81 mg by mouth every morning.      cabozantinib (CABOMETYX) 40 MG tablet Take 1 tablet (40 mg total) by mouth daily. Take on an empty stomach, 1 hour before or 2 hours after meals. 30 tablet 1   cholecalciferol (VITAMIN D) 1000 UNITS tablet Take 1,000 Units by mouth every morning.      clotrimazole (TM-CLOTRIMAZOLE) 1 % cream Apply 1 Application topically 2 (two) times daily. 30 g 0   FARXIGA 10  MG TABS tablet Take 10 mg by mouth daily.     glipiZIDE (GLUCOTROL XL) 2.5 MG 24 hr tablet Take 2.5 mg by mouth daily with breakfast. (Patient not taking: Reported on 02/01/2023)     hydrocortisone 1 % ointment Apply 1 Application topically 2 (two) times daily. 30 g 0   Insulin Glargine (LANTUS SOLOSTAR) 100 UNIT/ML Solostar Pen Inject 32 Units into the skin daily.     Metoprolol Succinate 50 MG CS24 1 capsule     Multiple Vitamin (MULTIVITAMIN WITH MINERALS) TABS tablet Take 1 tablet daily by mouth.     PAZEO 0.7 % SOLN Place 1 drop into both eyes every morning.  3   senna-docusate (SENNA S) 8.6-50 MG tablet Take 2 tablets by mouth at bedtime as needed for mild constipation or moderate constipation. (Patient not taking: Reported on 02/01/2023) 60 tablet 1   simvastatin (ZOCOR) 20 MG tablet Take 20 mg by mouth daily.     traMADol (ULTRAM) 50 MG tablet Take 1 tablet (50 mg total) by mouth every 6 (six) hours as needed. 60 tablet 0   No current facility-administered medications for this visit.   Facility-Administered Medications Ordered in Other Visits  Medication Dose Route Frequency Provider Last Rate Last Admin   dexamethasone (DECADRON) injection 8 mg  8 mg Intravenous Once Alex Gardener T, NP       ondansetron [facility]) 8 mg in sodium chloride 0.9 % 50 mL IVPB  8 mg Intravenous Once Alex Gardener T, NP       pantoprazole (PROTONIX) injection 40 mg  40 mg Intravenous Once Shon Hough, NP        REVIEW OF SYSTEMS:    10 Point review of Systems was done is negative except as noted above.   PHYSICAL EXAMINATION: Telemedicine visit  LABORATORY DATA:  I have reviewed the data as listed     Latest Ref Rng & Units 03/01/2023   11:26 AM 02/01/2023   12:27 PM 12/18/2022    2:21 PM  CBC  WBC 4.0 - 10.5 K/uL 4.7  4.0  4.2   Hemoglobin 13.0 - 17.0 g/dL 75.6  43.3  29.5   Hematocrit 39.0 - 52.0 % 44.8  48.9  47.1   Platelets 150 - 400 K/uL 206  150  140     CBC    Component Value Date/Time    WBC 4.7 03/01/2023 1126   WBC 5.3 07/22/2022 0812   RBC 5.85 (H) 03/01/2023 1126   HGB 14.0 03/01/2023 1126   HGB 11.5 (L) 03/22/2017 1402   HCT 44.8 03/01/2023 1126   HCT 37.0 (L) 03/22/2017 1402   PLT 206 03/01/2023 1126   PLT 262 03/22/2017 1402   MCV 76.6 (L) 03/01/2023 1126   MCV 72.3 (  L) 03/22/2017 1402   MCH 23.9 (L) 03/01/2023 1126   MCHC 31.3 03/01/2023 1126   RDW 18.2 (H) 03/01/2023 1126   RDW 15.6 (H) 03/22/2017 1402   LYMPHSABS 1.4 03/01/2023 1126   LYMPHSABS 1.8 03/22/2017 1402   MONOABS 0.4 03/01/2023 1126   MONOABS 0.4 03/22/2017 1402   EOSABS 0.1 03/01/2023 1126   EOSABS 0.1 03/22/2017 1402   BASOSABS 0.0 03/01/2023 1126   BASOSABS 0.0 03/22/2017 1402    .    Latest Ref Rng & Units 03/01/2023   11:26 AM 02/01/2023   12:27 PM 12/18/2022    2:21 PM  CMP  Glucose 70 - 99 mg/dL 161  096  045   BUN 8 - 23 mg/dL 24  28  24    Creatinine 0.61 - 1.24 mg/dL 4.09  8.11  9.14   Sodium 135 - 145 mmol/L 140  138  139   Potassium 3.5 - 5.1 mmol/L 3.6  4.0  4.4   Chloride 98 - 111 mmol/L 103  105  106   CO2 22 - 32 mmol/L 30  25  26    Calcium 8.9 - 10.3 mg/dL 8.9  9.5  9.4   Total Protein 6.5 - 8.1 g/dL 7.3  8.1  7.5   Total Bilirubin 0.3 - 1.2 mg/dL 0.8  0.7  0.5   Alkaline Phos 38 - 126 U/L 261  150  109   AST 15 - 41 U/L 120  85  51   ALT 0 - 44 U/L 34  28  23       06/21/2019 MR ABDOMEN WWO CONTRAST (Accession [id number])     IFE 1  Comment    Comments: Immunofixation shows IgG monoclonal protein with kappa light chain  specificity.           RADIOGRAPHIC STUDIES:  MRI abd w and wo contrast: 12/17/2016: IMPRESSION: 1. Postprocedural changes of thermal ablation again noted in the right lobe of the liver, without definitive evidence to suggest local recurrence of disease. No new hepatic lesions are noted. 2. Additional incidental findings, as above.     Electronically Signed   By: Trudie Reed M.D.   On: 12/17/2016 09:42  MRI abd w and wo  contrast 01/26/2020 IMPRESSION: 1. Interval enlargement of multiple hepatic masses as detailed above, consistent with progression of multifocal hepatocellular carcinoma. There is evidence of prior ablation in hepatic segments VII and VIII. 2. No evidence of metastatic disease in the abdomen.   Electronically Signed   By: Lauralyn Primes M.D.   On: 01/27/2020 15:51  MRI abd and wo contrast 10/04/2021 FINDINGS: Lower chest: Unremarkable   Hepatobiliary: Multiple liver lesions are present, some of these represent previously treated tumors and some represent active enhancing malignancy.   An index solid enhancing lesion in the lateral segment left hepatic lobe measures 3.4 by 2.8 cm on image 15 series 2, formerly 3.5 by 2.7 cm, essentially stable. Just below this a T2 hyperintense lesion measuring 1.5 by 1.1 cm on image 18 of series 2 previously measured 1.4 by 1.2 cm, likewise stable.   A currently enhancing lesion in the right hepatic lobe adjacent to the IVC measures 1.8 by 1.6 cm on image 13 series 2, stable.   A mostly centrally necrotic a 3.6 by 3.1 cm mostly centrally necrotic lesion in segment 4a of the liver on image 29 of series 21 previously measured 4.3 by 3.0 cm. This lesion is mostly centrally necrotic today although with some nodular  enhancement along its margin as on image 29 series 21.   Previously treated right hepatic lobe lesions are observed, and mostly nonenhancing or poorly enhancing.   A somewhat indistinctly marginated lesion posteriorly in segment 4a of the liver is diffusely enhancing and measures about 2.9 by 2.8 cm on image 28 of series 21. A previous faintly T2 hyperintense lesion in this vicinity on 02/20/2021 measured 2.0 by 1.6 cm, and accordingly this represents enlargement of this enhancing mass.   An enhancing mass laterally in segment 3 of the liver measures 3.0 by 2.6 cm on image 31 series 8, formerly 2.9 by 2.6 cm, essentially stable.   No  biliary dilatation is observed.   Pancreas:  Unremarkable   Spleen:  Unremarkable   Adrenals/Urinary Tract:  Unremarkable   Stomach/Bowel: Unremarkable   Vascular/Lymphatic: Atherosclerosis is present, including aortoiliac atherosclerotic disease.   Other:  No supplemental non-categorized findings.   Musculoskeletal: Unchanged mild lower lumbar spondylosis and degenerative disc disease.   IMPRESSION: 1. Mixed appearance, with some of the enhancing liver lesions stable; some improved such as the centrally necrotic anterior segment 4A lesion which demonstrates substantial central necrosis compared to previous; but with at least 1 worsened lesion which is the posterior segment 4a lesion (image 29, series 20), moderately increased in size. 2. Aortic Atherosclerosis (ICD10-I70.0). 3. Mild lower lumbar spondylosis and degenerative disc disease.  Electronically Signed   By: Gaylyn Rong M.D.   On: 10/06/2021 11:08  ASSESSMENT & PLAN:   77 y.o. male with  #1 Monoclonal gammopathy of undetermined significance.  M protein 0.2 mg/dL immunofixation showing IgG monoclonal protein with kappa light chain specificity. SPEP 02/2016 - 0.3g/dl  No overt anemia.  No overt hypercalcemia. Stable CKD creatinine 1.6  #2 bone lucencies in the right radial mid midshaft and left humeral head. PET/CT did not show any hypermetabolic bone lesions. MRI left shoulder showed no evidence of metastatic disease or multiple myeloma. The x-ray findings appear to be areas of mild demineralization. Patient was seen by orthopedics and no additional recommendations were given.  Plan -Patient's anemia is stable. No significant change in renal function. No new bone pains. No hypercalcemia . No overall no overt evidence of progression of multiple myeloma. -Myeloma labs from today show stable M protein @ 0.2g/dl with no evidence of progression- consistent with MGUS.  #3 Hepatocellular carcinoma treated with  TACE and percutaneous thermal ablation , Y 90 and recent bland arterial embolization on 06/06/2021 by interventional radiology.   Patient had bland embolization on 02/05/2022 for his hepatocellular carcinoma.   Last MRI on 08/06/2015 showed slight decrease in the size of the ablation defect involving the right lobe of the liver area and no findings to suggest residual or recurrent hepatocellular carcinoma. Mild changes of liver cirrhosis.  01/16/2016 MRI Abdomen showed resolution of previously ablated lesion but a new 1.3 cm lesion was noted which was subsequently ablated under CT guidance by interventional radiology on 01/31/2016. Elevated transaminases on 02/01/2016 were likely related to his ablation. These have since resolved.   06/24/2016 MRI Abdomen showed no evidence of recurrent hepatocellular carcinoma . 12/17/2016 MRI Abdomen shows no evidence of recurrent hepatocellular carcinoma .  10/26/17 MRI Abdomen revealed Motion degraded images. Status post thermal ablation of the segment 8 lesion, without enhancement to suggest residual viable tumor. Prior ablation of a segment 7 lesion, grossly unchanged. No convincing central enhancement  03/14/2019  MRI abdomen w and wo contrast revealed "1. New areas of restricted diffusion, surrounding nodular  arterial phase enhancement and delayed washout surrounding the ablation zone defects anteriorly in segments 8 and 7 consistent with local recurrence of hepatocellular carcinoma. 2. The peripheral ablation zone defect laterally in segment 8 is unchanged. 3. No extrahepatic tumor identified."  10/02/2019 MRI Abdomen ([id number]) revealed "1. Progressive multifocal hepatocellular carcinoma, especially anteriorly around the ablated lesion in segments 4B and 8. There are additional new lesions in segments 2 and 3. No evidence of extrahepatic metastatic disease."  01/26/2020 MRI Abdomen ([id number]) revealed "1. Interval enlargement of multiple hepatic masses as  detailed above, consistent with progression of multifocal hepatocellular carcinoma. There is evidence of prior ablation in hepatic segments VII and VIII. 2. No evidence of metastatic disease in the abdomen."  10/04/2021 MRI Abdomen ([id number]) revealed "1. Mixed appearance, with some of the enhancing liver lesions stable; some improved such as the centrally necrotic anterior segment 4A lesion which demonstrates substantial central necrosis compared to previous; but with at least 1 worsened lesion which is the posterior segment 4a lesion (image 29, series 20), moderately increased in size."  04/09/2022 MRI Abdomen ([id number]) revealed "1. Again noted are multiple lesions involving both lobes of liver compatible with multifocal hepatocellular carcinoma. When compared with the previous exam there no significant change in the overall tumor volume from 10/04/2021. 2. Interval treatment response with hypoenhancement of lesion within the segment 3 and the small lesion within dome of segment 2. 3. The remaining lesions continue to exhibit variable degrees of internal enhancement. 4. There is a new enhancing lesion within the dome of liver (segment 8/4a). 5. Aortic Atherosclerosis"   #4 Hepatitis C genotype 1A status post treatment with Viekira and Ribavarin for 24 weeks.   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    PLAN:  -Patient has been tolerating his current dose of Cabozantinib well without any severe toxicities. Does complain of constipation and flatus. -Continue Cabozantinib 40 mg. -Discussed lab results from today, 03/01/2023, with the patient. CBC is stable. CMP shows elevated glucose level at 142, elevated creatinine at 1.79, elevated AST at 120, and elevated alkaline phosphate at 261.  AFP tumor marker pending -Discussed with the patient that he can take Senna-S twice a day for constipation.    FOLLOW-UP: *** The total  time spent in the appointment was *** minutes* .  All of the patient's questions were answered with apparent satisfaction. The patient knows to call the clinic with any problems, questions or concerns.   Wyvonnia Lora MD MS AAHIVMS [facility] Hematology/Oncology Physician [facility]  .*Total Encounter Time as defined by the Centers for Medicare and Medicaid Services includes, in addition to the face-to-face time of a patient visit (documented in the note above) non-face-to-face time: obtaining and reviewing outside history, ordering and reviewing medications, tests or procedures, care coordination (communications with other health care professionals or caregivers) and documentation in the medical record.   I,Param Shah,acting as a Neurosurgeon for Wyvonnia Lora, MD.,have documented all relevant documentation on the behalf of Wyvonnia Lora, MD,as directed by  Wyvonnia Lora, MD while in the presence of Wyvonnia Lora, MD.

## 2023-05-04 ENCOUNTER — Inpatient Hospital Stay: Payer: Medicare HMO | Attending: Hematology | Admitting: Physician Assistant

## 2023-05-04 ENCOUNTER — Other Ambulatory Visit: Payer: Medicare HMO

## 2023-05-04 ENCOUNTER — Telehealth: Payer: Self-pay

## 2023-05-04 ENCOUNTER — Other Ambulatory Visit: Payer: Self-pay

## 2023-05-04 ENCOUNTER — Inpatient Hospital Stay: Payer: Medicare HMO

## 2023-05-04 ENCOUNTER — Inpatient Hospital Stay: Payer: Medicare HMO | Admitting: Hematology

## 2023-05-04 ENCOUNTER — Other Ambulatory Visit (HOSPITAL_COMMUNITY): Payer: Self-pay

## 2023-05-04 VITALS — BP 116/84 | HR 101 | Temp 97.5°F | Resp 18 | Ht 72.0 in | Wt 176.0 lb

## 2023-05-04 DIAGNOSIS — D472 Monoclonal gammopathy: Secondary | ICD-10-CM | POA: Diagnosis not present

## 2023-05-04 DIAGNOSIS — Z87891 Personal history of nicotine dependence: Secondary | ICD-10-CM | POA: Diagnosis not present

## 2023-05-04 DIAGNOSIS — R630 Anorexia: Secondary | ICD-10-CM | POA: Diagnosis not present

## 2023-05-04 DIAGNOSIS — C220 Liver cell carcinoma: Secondary | ICD-10-CM

## 2023-05-04 DIAGNOSIS — D649 Anemia, unspecified: Secondary | ICD-10-CM | POA: Insufficient documentation

## 2023-05-04 DIAGNOSIS — K1231 Oral mucositis (ulcerative) due to antineoplastic therapy: Secondary | ICD-10-CM | POA: Diagnosis not present

## 2023-05-04 DIAGNOSIS — Z7189 Other specified counseling: Secondary | ICD-10-CM | POA: Diagnosis not present

## 2023-05-04 LAB — CMP (CANCER CENTER ONLY)
ALT: 62 U/L — ABNORMAL HIGH (ref 0–44)
AST: 201 U/L (ref 15–41)
Albumin: 2.7 g/dL — ABNORMAL LOW (ref 3.5–5.0)
Alkaline Phosphatase: 484 U/L — ABNORMAL HIGH (ref 38–126)
Anion gap: 7 (ref 5–15)
BUN: 40 mg/dL — ABNORMAL HIGH (ref 8–23)
CO2: 27 mmol/L (ref 22–32)
Calcium: 8.1 mg/dL — ABNORMAL LOW (ref 8.9–10.3)
Chloride: 105 mmol/L (ref 98–111)
Creatinine: 2.04 mg/dL — ABNORMAL HIGH (ref 0.61–1.24)
GFR, Estimated: 33 mL/min — ABNORMAL LOW (ref >60)
Glucose, Bld: 148 mg/dL — ABNORMAL HIGH (ref 70–99)
Potassium: 4.1 mmol/L (ref 3.5–5.1)
Sodium: 139 mmol/L (ref 135–145)
Total Bilirubin: 1.2 mg/dL (ref 0.3–1.2)
Total Protein: 6 g/dL — ABNORMAL LOW (ref 6.5–8.1)

## 2023-05-04 LAB — CBC WITH DIFFERENTIAL (CANCER CENTER ONLY)
Abs Immature Granulocytes: 0.01 10*3/uL (ref 0.00–0.07)
Basophils Absolute: 0 10*3/uL (ref 0.0–0.1)
Basophils Relative: 0 %
Eosinophils Absolute: 0.1 10*3/uL (ref 0.0–0.5)
Eosinophils Relative: 2 %
HCT: 46.3 % (ref 39.0–52.0)
Hemoglobin: 14.8 g/dL (ref 13.0–17.0)
Immature Granulocytes: 0 %
Lymphocytes Relative: 26 %
Lymphs Abs: 0.7 10*3/uL (ref 0.7–4.0)
MCH: 25 pg — ABNORMAL LOW (ref 26.0–34.0)
MCHC: 32 g/dL (ref 30.0–36.0)
MCV: 78.2 fL — ABNORMAL LOW (ref 80.0–100.0)
Monocytes Absolute: 0.3 10*3/uL (ref 0.1–1.0)
Monocytes Relative: 11 %
Neutro Abs: 1.7 10*3/uL (ref 1.7–7.7)
Neutrophils Relative %: 61 %
Platelet Count: 151 10*3/uL (ref 150–400)
RBC: 5.92 MIL/uL — ABNORMAL HIGH (ref 4.22–5.81)
RDW: 21 % — ABNORMAL HIGH (ref 11.5–15.5)
WBC Count: 2.8 10*3/uL — ABNORMAL LOW (ref 4.0–10.5)
nRBC: 0 % (ref 0.0–0.2)

## 2023-05-04 LAB — MAGNESIUM: Magnesium: 2.1 mg/dL (ref 1.7–2.4)

## 2023-05-04 MED ORDER — TRAMADOL HCL 50 MG PO TABS
50.0000 mg | ORAL_TABLET | Freq: Once | ORAL | Status: AC
  Administered 2023-05-04: 50 mg via ORAL
  Filled 2023-05-04: qty 1

## 2023-05-04 MED ORDER — STERILE WATER FOR INJECTION IJ SOLN
OROMUCOSAL | 1 refills | Status: DC
  Filled 2023-05-04: qty 300, 14d supply, fill #0

## 2023-05-04 NOTE — Progress Notes (Signed)
CRITICAL VALUE STICKER  CRITICAL VALUE: AST [id number]  MD NOTIFIED: Georga Kaufmann  TIME OF NOTIFICATION: 1141  RESPONSE:Irene to see patient

## 2023-05-04 NOTE — Telephone Encounter (Signed)
MAGIC MOUTHWASH W/LIDOCAINE SOLUTION Sig: Dispense 400 ml of magic mouthwash compounded preparation.  Patient is to use 5 ml 4 times a day as needed for mouth discomfort. 80 ml viscous lidocaine 2% 80 ml Mylanta 80 ml diphenhydramine at 12.5 mg per 5 ml elixir 80 ml nystatin at 100,000 U per 5 ml suspension 80 ml distilled water  Refill: one  SWISH AND SPIT   Called to [facility]/[place]

## 2023-05-04 NOTE — Progress Notes (Addendum)
HEMATOLOGY/ONCOLOGY CLINIC NOTE  Date of Service: 05/04/23  Patient Care Team: Irena Reichmann, DO as PCP - General (Family Medicine) Johney Maine, MD as Consulting Physician (Hematology)  CHIEF COMPLAINTS/PURPOSE OF CONSULTATION:  Follow-up for continued evaluation and management of hepatocellular carcinoma.  INTERVAL HISTORY:  Aaron [name] is a 77 y.o. male returns for a follow up for continued evaluation and management of HCC. He was last evaluated by Dr. Candise Che on 03/01/2023. He is accompanied by his wife for this visit.   Aaron [name] reports very low energy levels and is sedentary for most of the day. He has poor appetite and has lost close to 15 lbs since July 2024. He reports inflammation in the mouth and dry mouth with Cabozantinib.This has slightly improved since holding the medication on 04/23/2023. He denies nausea or vomiting. He does have mid abdominal pain that he contributes to gas. He has constipation which has improved with stool softeners. He is having worsening left hip pain that has been present since May 2024. The pain radiates down to his left ankle. He takes tylenol with minimal relief. He is hesitant to take Tramadol that was previously prescribed as he does not wanted to be addicted. He denies fevers, chills, sweats, shortness of breath, chest pain or cough. He has no other complaints.    MEDICAL HISTORY:  Past Medical History:  Diagnosis Date   Arthritis    Diabetes mellitus without complication (HCC)    type II    Dyspnea    Elevated liver enzymes    Fatty liver    Hepatitis C    genotype 1A status post treatment with Heloise Purpura and Ribavarin for 24 weeks   Hepatocellular carcinoma (HCC) 01/30/2014   Path   History of colon polyps    Hyperlipidemia    Hypertension    Iron deficiency anemia    hx of    MGUS (monoclonal gammopathy of unknown significance)     SURGICAL HISTORY:  Status post microwave ablation[October 2015] of liver lesion and  TACE [July 2015] for HCC. EGD and colonoscopy in 2016   SOCIAL HISTORY: Social History   Socioeconomic History   Marital status: Married    Spouse name: Not on file   Number of children: Not on file   Years of education: Not on file   Highest education level: Not on file  Occupational History   Not on file  Tobacco Use   Smoking status: Former    Current packs/day: 0.00    Types: Cigarettes    Quit date: 01/30/1994    Years since quitting: 29.2   Smokeless tobacco: Never   Tobacco comments:    stopped 25 yrs ago  Vaping Use   Vaping status: Never Used  Substance and Sexual Activity   Alcohol use: No    Comment: stopped 30 years ago    Drug use: No   Sexual activity: Not Currently  Other Topics Concern   Not on file  Social History Narrative   Not on file   Social Determinants of Health   Financial Resource Strain: Not on file  Food Insecurity: Not on file  Transportation Needs: Not on file  Physical Activity: Not on file  Stress: Not on file  Social Connections: Not on file  Intimate Partner Violence: Not on file    FAMILY HISTORY: No family history on file.  ALLERGIES:  has No Known Allergies.  MEDICATIONS:  Current Outpatient Medications  Medication Sig Dispense Refill  ACCU-CHEK AVIVA PLUS test strip      Accu-Chek Softclix Lancets lancets      amLODipine (NORVASC) 10 MG tablet Take 1 tablet (10 mg total) by mouth daily. 90 tablet 1   aspirin EC 81 MG tablet Take 81 mg by mouth every morning.      cabozantinib (CABOMETYX) 40 MG tablet Take 1 tablet (40 mg total) by mouth daily. Take on an empty stomach, 1 hour before or 2 hours after meals. 30 tablet 1   cholecalciferol (VITAMIN D) 1000 UNITS tablet Take 1,000 Units by mouth every morning.      clotrimazole (TM-CLOTRIMAZOLE) 1 % cream Apply 1 Application topically 2 (two) times daily. 30 g 0   FARXIGA 10 MG TABS tablet Take 10 mg by mouth daily.     glipiZIDE (GLUCOTROL XL) 2.5 MG 24 hr tablet Take 2.5  mg by mouth daily with breakfast.     hydrocortisone 1 % ointment Apply 1 Application topically 2 (two) times daily. 30 g 0   Insulin Glargine (LANTUS SOLOSTAR) 100 UNIT/ML Solostar Pen Inject 32 Units into the skin daily.     Metoprolol Succinate 50 MG CS24 1 capsule     Multiple Vitamin (MULTIVITAMIN WITH MINERALS) TABS tablet Take 1 tablet daily by mouth.     PAZEO 0.7 % SOLN Place 1 drop into both eyes every morning.  3   senna-docusate (SENNA S) 8.6-50 MG tablet Take 2 tablets by mouth at bedtime as needed for mild constipation or moderate constipation. 60 tablet 1   simvastatin (ZOCOR) 20 MG tablet Take 20 mg by mouth daily.     magic mouthwash (multi-ingredient) oral suspension Swish and spit four times daily as needed for mouth discomfort. 400 mL 1   traMADol (ULTRAM) 50 MG tablet Take 1 tablet (50 mg total) by mouth every 6 (six) hours as needed. (Patient not taking: Reported on 05/04/2023) 60 tablet 0   No current facility-administered medications for this visit.   Facility-Administered Medications Ordered in Other Visits  Medication Dose Route Frequency Provider Last Rate Last Admin   dexamethasone (DECADRON) injection 8 mg  8 mg Intravenous Once Alex Gardener T, NP       ondansetron [facility]) 8 mg in sodium chloride 0.9 % 50 mL IVPB  8 mg Intravenous Once Alex Gardener T, NP       pantoprazole (PROTONIX) injection 40 mg  40 mg Intravenous Once Shon Hough, NP        REVIEW OF SYSTEMS:    10 Point review of Systems was done is negative except as noted above.   PHYSICAL EXAM: Vitals:   05/04/23 1138  BP: 116/84  Pulse: (!) 101  Resp: 18  Temp: (!) 97.5 F (36.4 C)  SpO2: 100%    Constitutional: Oriented to person, place, and time. Chronically ill appearing in no acute distress.  HENT:  Head: Normocephalic and atraumatic.  Eyes: Conjunctivae are normal. Right eye exhibits no discharge. Left eye exhibits no discharge. No scleral icterus.  Cardiovascular: Normal  rate, regular rhythm, normal heart sounds and intact distal pulses.   Pulmonary/Chest: Effort normal and breath sounds normal. No respiratory distress. No wheezes. No rales.  Musculoskeletal: Normal range of motion. Exhibits no edema.  Neurological: Alert and oriented to person, place, and time.  Skin: Skin is warm and dry. No rash noted. Not diaphoretic. No erythema. No pallor.  Psychiatric: Mood, memory and judgment normal.    LABORATORY DATA:  I have reviewed the data  as listed     Latest Ref Rng & Units 05/04/2023   10:49 AM 03/01/2023   11:26 AM 02/01/2023   12:27 PM  CBC  WBC 4.0 - 10.5 K/uL 2.8  4.7  4.0   Hemoglobin 13.0 - 17.0 g/dL 65.7  84.6  96.2   Hematocrit 39.0 - 52.0 % 46.3  44.8  48.9   Platelets 150 - 400 K/uL 151  206  150     CBC    Component Value Date/Time   WBC 2.8 (L) 05/04/2023 1049   WBC 5.3 07/22/2022 0812   RBC 5.92 (H) 05/04/2023 1049   HGB 14.8 05/04/2023 1049   HGB 11.5 (L) 03/22/2017 1402   HCT 46.3 05/04/2023 1049   HCT 37.0 (L) 03/22/2017 1402   PLT 151 05/04/2023 1049   PLT 262 03/22/2017 1402   MCV 78.2 (L) 05/04/2023 1049   MCV 72.3 (L) 03/22/2017 1402   MCH 25.0 (L) 05/04/2023 1049   MCHC 32.0 05/04/2023 1049   RDW 21.0 (H) 05/04/2023 1049   RDW 15.6 (H) 03/22/2017 1402   LYMPHSABS 0.7 05/04/2023 1049   LYMPHSABS 1.8 03/22/2017 1402   MONOABS 0.3 05/04/2023 1049   MONOABS 0.4 03/22/2017 1402   EOSABS 0.1 05/04/2023 1049   EOSABS 0.1 03/22/2017 1402   BASOSABS 0.0 05/04/2023 1049   BASOSABS 0.0 03/22/2017 1402    .    Latest Ref Rng & Units 05/04/2023   10:49 AM 03/01/2023   11:26 AM 02/01/2023   12:27 PM  CMP  Glucose 70 - 99 mg/dL 952  841  324   BUN 8 - 23 mg/dL 40  24  28   Creatinine 0.61 - 1.24 mg/dL 4.01  0.27  2.53   Sodium 135 - 145 mmol/L 139  140  138   Potassium 3.5 - 5.1 mmol/L 4.1  3.6  4.0   Chloride 98 - 111 mmol/L 105  103  105   CO2 22 - 32 mmol/L 27  30  25    Calcium 8.9 - 10.3 mg/dL 8.1  8.9  9.5   Total  Protein 6.5 - 8.1 g/dL 6.0  7.3  8.1   Total Bilirubin 0.3 - 1.2 mg/dL 1.2  0.8  0.7   Alkaline Phos 38 - 126 U/L 484  261  150   AST 15 - 41 U/L 201  120  85   ALT 0 - 44 U/L 62  34  28     RADIOGRAPHIC STUDIES: No images were reviewed with patient.   ASSESSMENT & PLAN:  Aaron [name] is a 77 y.o. male who returns for a follow up for [facility].  #Hepatocellular carcinoma: -Treated with TACE and percutaneous thermal ablation , Y90 and recent bland arterial embolization on 06/06/2021 by interventional radiology.  -Underwent bland embolization on 02/05/2022 -MRI Abdomen from 01/16/2016 showed resolution of previously ablated lesion but a new 1.3 cm lesion was noted which was subsequently ablated under CT guidance by interventional radiology on 01/31/2016. -He underwent Y90 radioembolization to the liver on 03/01/2020, 06/18/2020, 11/19/2020.  -Prior systemic treatments include Atezolizumab plus Avastin, Lenvima, and most recently Cabozantinib.   #IgG Kappa MGUS: -Last SPEP was from 03/30/2019 that showed stable M-protein measuring 0.2 g/dL.   #Hepatitis C genotype 1A s -Status post treatment with Viekira and Ribavarin for 24 weeks.   #Microcytosis with Minimal Anemia  -Hgb electrophoresis suggestive of thalassemia trait given relative polycythemia.    PLAN:  -Currently holding Cabozantinib due to poor tolerance with mucositis  and poor appetite. -Reviewed labs from today. WBC 2.8, Hgb 14.8, Plt 151K, Creatinine 2.04, AST 201, ALT 62 -AFP marker is pending today but most recent level from 7/82024 showed marked increased to 40,239 (previously 24,444). -Recommend concern for progression and overall poor performance status with weight loss, fatigue and worsening pain.  -Reviewed goals of care and recommend hospice care to focus on cancer-related symptoms without active treatment.  -Encouraged patient to take tramadol for left hip and lower extremity pain. Hospice care will follow up for further pain  management.  -Sent magic mouthwash for mucositis.   FOLLOW-UP: Home hospice care   All of the patient's questions were answered with apparent satisfaction. The patient knows to call the clinic with any problems, questions or concerns.  I have spent a total of 30 minutes minutes of face-to-face and non-face-to-face time, preparing to see the patient, performing a medically appropriate examination, counseling and educating the patient, ordering meds, referring and communicating with other health care professionals, documenting clinical information in the electronic health record, independently interpreting results and communicating results to the patient, and care coordination.   Georga Kaufmann PA-C Dept of Hematology and Oncology [facility] Phone: [phone]   ADDENDUM .Patient was Personally and independently interviewed, examined and relevant elements of the history of present illness were reviewed in details and an assessment and plan was created. All elements of the patient's history of present illness , assessment and plan were discussed in details with Georga Kaufmann PA-C. The above documentation reflects our combined findings assessment and plan.   Wyvonnia Lora MD MS

## 2023-05-07 NOTE — Addendum Note (Signed)
Addended by: Wyvonnia Lora on: 05/07/2023 01:54 AM   Modules accepted: Level of Service

## 2023-05-12 ENCOUNTER — Ambulatory Visit: Payer: Medicare HMO | Admitting: Hematology

## 2023-05-12 ENCOUNTER — Other Ambulatory Visit: Payer: Medicare HMO

## 2023-05-27 ENCOUNTER — Other Ambulatory Visit: Payer: Self-pay

## 2023-05-31 ENCOUNTER — Other Ambulatory Visit (HOSPITAL_COMMUNITY): Payer: Self-pay

## 2023-06-02 ENCOUNTER — Other Ambulatory Visit: Payer: Self-pay

## 2023-06-04 ENCOUNTER — Other Ambulatory Visit (HOSPITAL_COMMUNITY): Payer: Self-pay

## 2023-06-04 ENCOUNTER — Other Ambulatory Visit: Payer: Self-pay | Admitting: Pharmacy Technician

## 2023-06-04 NOTE — Progress Notes (Signed)
Patient has transferred to Hospice care.

## 2023-06-12 ENCOUNTER — Other Ambulatory Visit: Payer: Self-pay | Admitting: Hematology

## 2023-06-12 DIAGNOSIS — C220 Liver cell carcinoma: Secondary | ICD-10-CM

## 2023-06-16 IMAGING — XA IR EMBO TUMOR ORGAN ISCHEMIA INFARCT INC GUIDE ROADMAPPING
3 series · 17 of 24 positions shown · non-contrast
Comparison: none

INDICATION: 76-year-old with multifocal hepatocellular carcinoma. Bulk of the
patient's disease is now in the lateral left hepatic lobe and being
supplied by an accessory left hepatic artery from the left gastric
artery. Plan for catheterization of this accessory left hepatic
artery and bland embolization of the liver tumors.

[Series 750: axial spin · axial · 1.9mm · 0.48mm/px · z∈[-641,-426]mm · 6 of 114 slices shown]
[im 1/114]
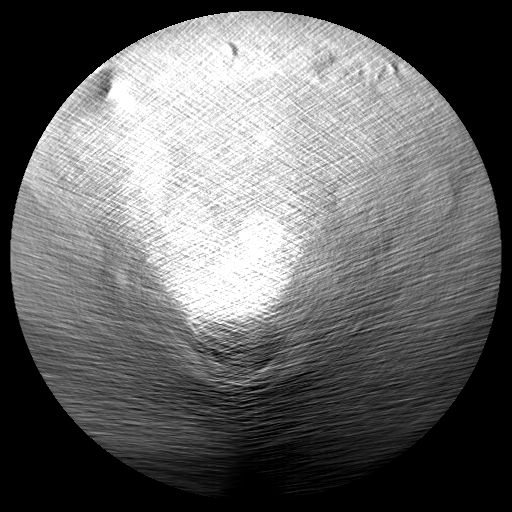
[im 33/114]
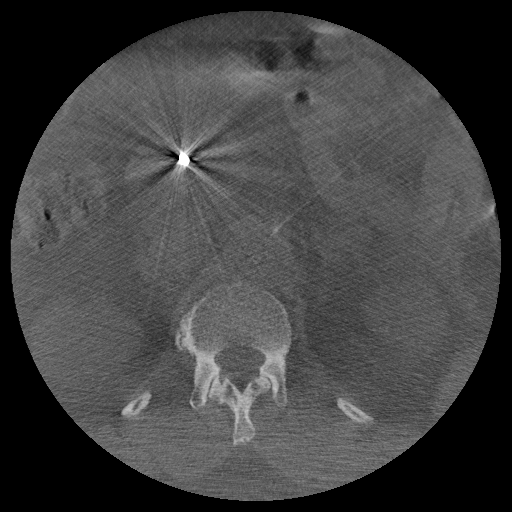
[im 49/114]
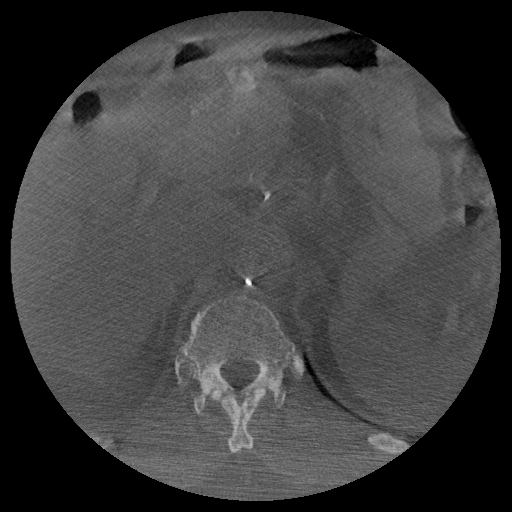
[im 65/114]
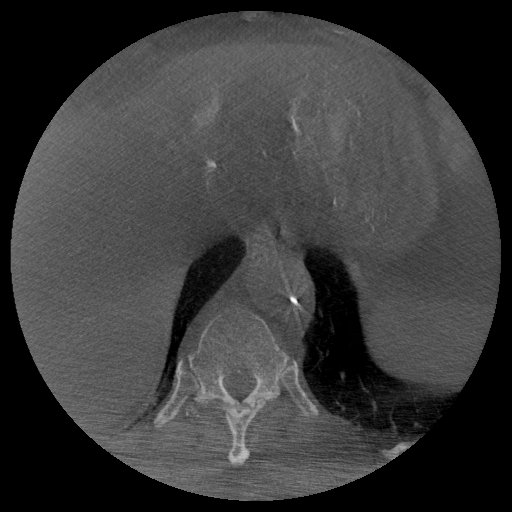
[im 97/114]
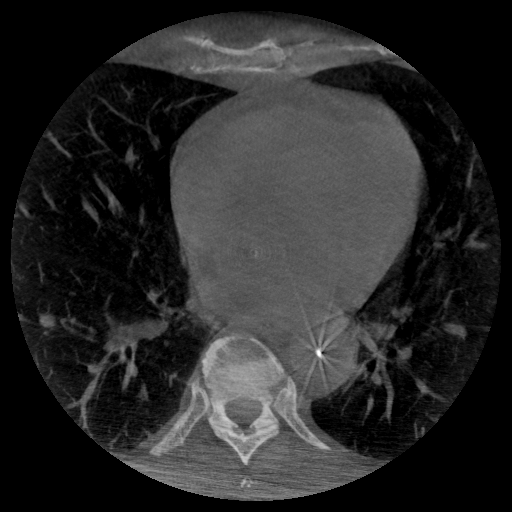
[im 114/114]
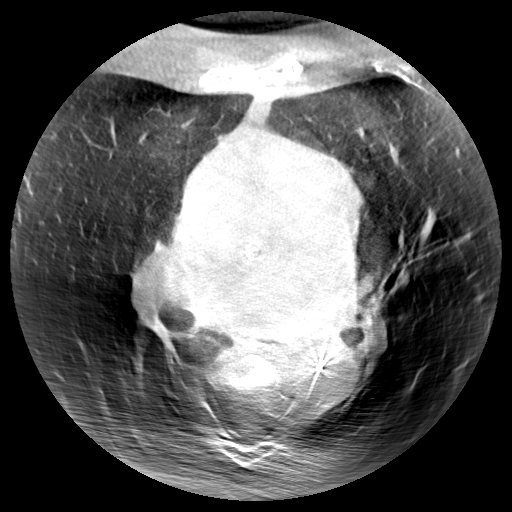

[Series 751: sag spin 2 · sagittal · 1.9mm · 0.48mm/px · 5 of 124 slices shown]
[im 18/124]
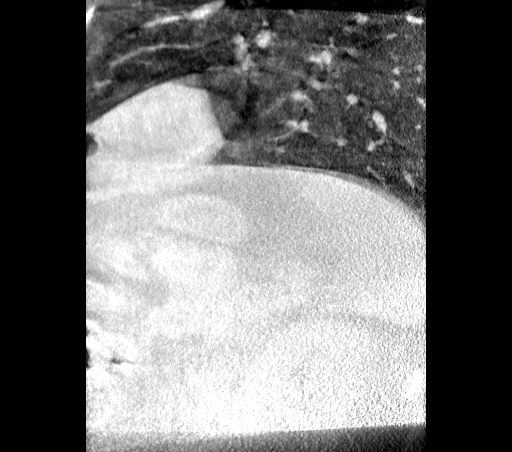
[im 36/124]
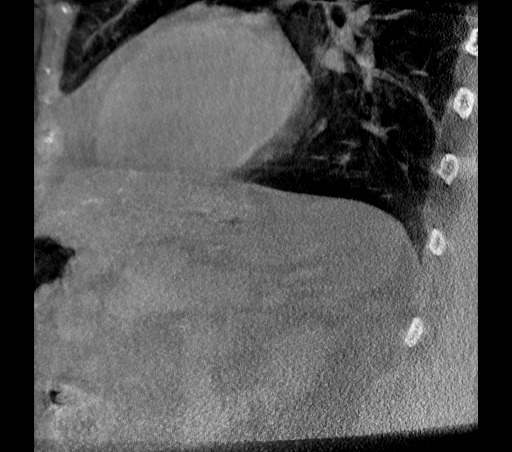
[im 71/124]
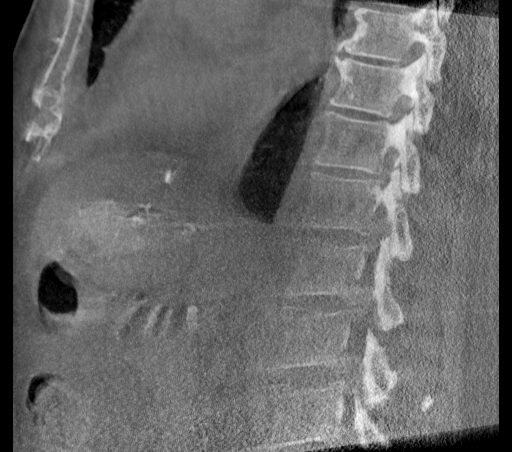
[im 88/124]
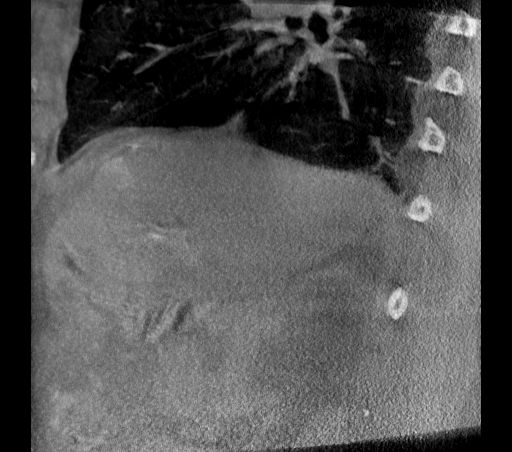
[im 106/124]
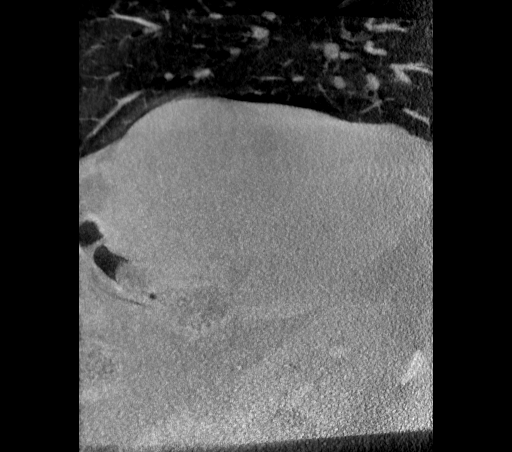

[Series 752: coronal spin 2 · coronal · 1.9mm · 0.48mm/px · 6 of 123 slices shown]
[im 1/123]
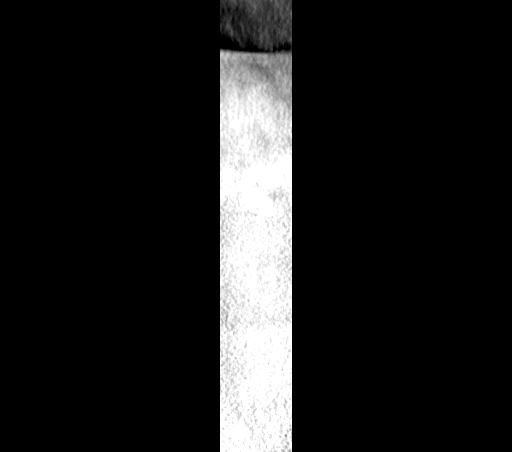
[im 18/123]
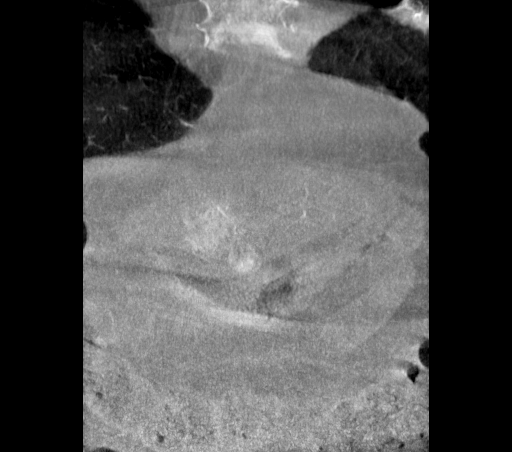
[im 53/123]
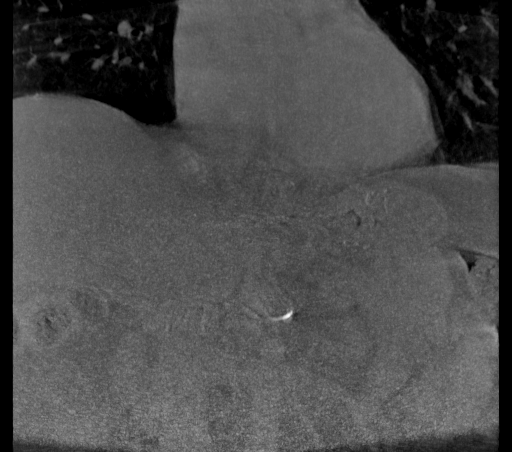
[im 70/123]
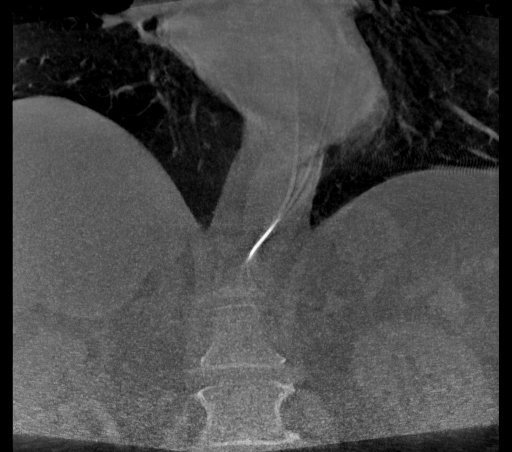
[im 88/123]
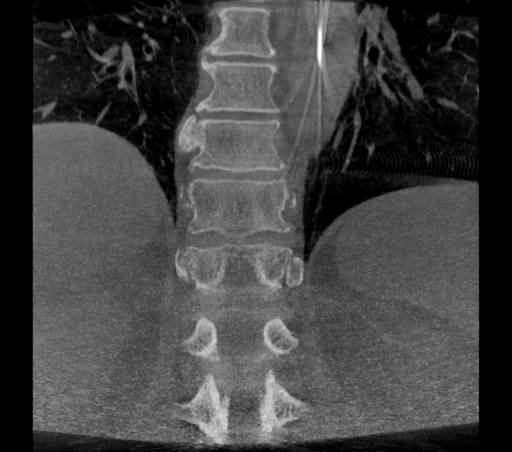
[im 123/123  full-range]
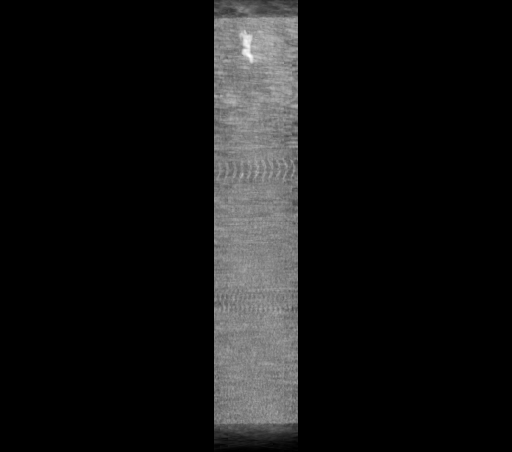

[17 of 24 positions shown; findings below may reference images not displayed]

EXAM:
1. Visceral angiography: celiac artery, left gastric artery and
accessory left hepatic artery
2. Bland embolization of the accessory left hepatic artery
3. Cone beam CT
4. Ultrasound guidance for vascular access

MEDICATIONS:
2 g cefoxitin. The antibiotic was administered within 1 hour of the
procedure

8 mg Zofran, 8 mg Decadron, 40 mg Protonix

ANESTHESIA/SEDATION:
Moderate (conscious) sedation was employed during this procedure. A
total of Versed 2.0 mg and Fentanyl 100 mcg was administered
intravenously by the radiology nurse.

Total intra-service moderate Sedation Time: 2 hours and 12 minutes.
The patient's level of consciousness and vital signs were monitored
continuously by radiology nursing throughout the procedure under my
direct supervision.

CONTRAST:  35 mL Visipaque 320

FLUOROSCOPY:
Radiation Exposure Index (as provided by the fluoroscopic device):
2271 mGy Kerma

COMPLICATIONS:
None immediate.

PROCEDURE:
Informed consent was obtained from the patient following explanation
of the procedure, risks, benefits and alternatives. The patient
understands, agrees and consents for the procedure. All questions
were addressed. A time out was performed prior to the initiation of
the procedure.

Ultrasound confirmed a patent left radial artery. Ultrasound image
was saved for documentation. [REDACTED] test was adequate for radial
artery access. The left wrist was prepped with chlorhexidine and
sterile field was created. Maximal barrier sterile technique was
utilized including caps, mask, sterile gowns, sterile gloves,
sterile drape, hand hygiene and skin antiseptic.

Left wrist was anesthetized with 1% lidocaine. Needle was directed
into the left radial artery with ultrasound guidance. Terumo 5
French sheath was advanced over the wire. Cocktail containing 2.5 mg
of verapamil, 200 mg of nitroglycerin and 0000 units of heparin was
hemodiluted and administered through the radial artery sheath. CELIS
MG1 was initially advanced into the abdominal aorta over a Bentson
wire and used to cannulate the celiac trunk. Celiac artery
arteriogram was performed in order to identify the left gastric
artery. Multiple attempts were made to cannulate the left gastric
artery with high-flow Renegade catheter and different wires. These
attempts were unsuccessful. The CELIS catheter was exchanged for an
Ultimate Radial catheter and used to cannulate the celiac trunk.
Again, the left gastric artery cannot be cannulated with a high-flow
Renegade catheter. The high-flow Renegade catheter was exchanged for
Zeggai Kami microcatheter. The microcatheter was
successfully advanced into the left gastric artery and selective
angiography was performed. The accessory left hepatic artery was
identified and successfully catheterized. Catheter was advanced into
the accessory left hepatic artery and dedicated angiograms performed
including cone beam CT. Bland embolization was performed with the
catheter in the accessory left hepatic artery. [DATE] vial 100-300
micron Embospheres was slowly injected into the accessory left
hepatic artery under fluoroscopic guidance. Embospheres were
injected until there was pruning of the left hepatic artery
vasculature and no significant filling of the hypervascular tumors.
Follow-up angiogram was performed after the embolization. The
microcatheter was removed. The 5 French catheter was removed over a
wire. The radial artery sheath was flushed and an additional 200 mg
nitroglycerin was injected through the sheath with hemodilution. The
left radial artery sheath was removed using a TR band.
FINDINGS: Lateral angiography of the celiac trunk demonstrates the left
gastric artery just proximal and posterior to the splenic artery and
common hepatic artery.

Left gastric artery demonstrates a prominent accessory left hepatic
artery supplying a large portion of the left hepatic lobe. Catheter
was advanced into the accessory left hepatic artery and angiography
demonstrates multiple hypervascular lesions. Embospheres were
injected until there was markedly decreased antegrade flow in the
accessory left hepatic artery. Final angiogram demonstrated marked
pruning of the accessory left hepatic artery distribution and there
was reflux into the left gastric artery branches. Main left gastric
artery branches appear to be patent after embolization.
IMPRESSION: 1. Successful bland embolization of the accessory left hepatic
artery and left hepatic tumors.

## 2023-06-25 DEATH — deceased

## 2024-06-21 ENCOUNTER — Other Ambulatory Visit (HOSPITAL_BASED_OUTPATIENT_CLINIC_OR_DEPARTMENT_OTHER): Payer: Self-pay
